# Patient Record
Sex: Female | Born: 1955 | Hispanic: No | Marital: Single | Smoking: Former smoker
Health system: Southern US, Community
[De-identification: ages and names within clinical notes are randomized; demographics above are authoritative.]

## PROBLEM LIST (undated history)

## (undated) DIAGNOSIS — G35D Multiple sclerosis, unspecified: Secondary | ICD-10-CM

## (undated) DIAGNOSIS — R55 Syncope and collapse: Secondary | ICD-10-CM

## (undated) DIAGNOSIS — I10 Essential (primary) hypertension: Secondary | ICD-10-CM

## (undated) DIAGNOSIS — R7303 Prediabetes: Secondary | ICD-10-CM

## (undated) DIAGNOSIS — M1990 Unspecified osteoarthritis, unspecified site: Secondary | ICD-10-CM

## (undated) DIAGNOSIS — Z87442 Personal history of urinary calculi: Secondary | ICD-10-CM

## (undated) DIAGNOSIS — D219 Benign neoplasm of connective and other soft tissue, unspecified: Secondary | ICD-10-CM

## (undated) DIAGNOSIS — M539 Dorsopathy, unspecified: Secondary | ICD-10-CM

## (undated) DIAGNOSIS — K219 Gastro-esophageal reflux disease without esophagitis: Secondary | ICD-10-CM

## (undated) DIAGNOSIS — F419 Anxiety disorder, unspecified: Secondary | ICD-10-CM

## (undated) DIAGNOSIS — N809 Endometriosis, unspecified: Secondary | ICD-10-CM

## (undated) DIAGNOSIS — L732 Hidradenitis suppurativa: Secondary | ICD-10-CM

## (undated) DIAGNOSIS — M069 Rheumatoid arthritis, unspecified: Secondary | ICD-10-CM

## (undated) DIAGNOSIS — I5189 Other ill-defined heart diseases: Secondary | ICD-10-CM

## (undated) DIAGNOSIS — J45909 Unspecified asthma, uncomplicated: Secondary | ICD-10-CM

## (undated) DIAGNOSIS — G709 Myoneural disorder, unspecified: Secondary | ICD-10-CM

## (undated) DIAGNOSIS — R011 Cardiac murmur, unspecified: Secondary | ICD-10-CM

## (undated) DIAGNOSIS — B1920 Unspecified viral hepatitis C without hepatic coma: Secondary | ICD-10-CM

## (undated) DIAGNOSIS — G35 Multiple sclerosis: Secondary | ICD-10-CM

## (undated) DIAGNOSIS — C801 Malignant (primary) neoplasm, unspecified: Secondary | ICD-10-CM

## (undated) DIAGNOSIS — M75100 Unspecified rotator cuff tear or rupture of unspecified shoulder, not specified as traumatic: Secondary | ICD-10-CM

## (undated) DIAGNOSIS — F32A Depression, unspecified: Secondary | ICD-10-CM

## (undated) HISTORY — DX: Prediabetes: R73.03

## (undated) HISTORY — PX: ROTATOR CUFF REPAIR: SHX139

## (undated) HISTORY — DX: Unspecified rotator cuff tear or rupture of unspecified shoulder, not specified as traumatic: M75.100

## (undated) HISTORY — DX: Unspecified osteoarthritis, unspecified site: M19.90

## (undated) HISTORY — DX: Syncope and collapse: R55

## (undated) HISTORY — DX: Unspecified asthma, uncomplicated: J45.909

## (undated) HISTORY — DX: Endometriosis, unspecified: N80.9

## (undated) HISTORY — DX: Other ill-defined heart diseases: I51.89

## (undated) HISTORY — DX: Gastro-esophageal reflux disease without esophagitis: K21.9

## (undated) HISTORY — DX: Depression, unspecified: F32.A

## (undated) HISTORY — DX: Benign neoplasm of connective and other soft tissue, unspecified: D21.9

## (undated) HISTORY — DX: Rheumatoid arthritis, unspecified: M06.9

## (undated) HISTORY — DX: Hidradenitis suppurativa: L73.2

## (undated) HISTORY — DX: Dorsopathy, unspecified: M53.9

## (undated) HISTORY — PX: BACK SURGERY: SHX140

## (undated) HISTORY — PX: ANGIOPLASTY: SHX39

## (undated) HISTORY — PX: OTHER SURGICAL HISTORY: SHX169

---

## 2007-01-15 ENCOUNTER — Other Ambulatory Visit: Admission: RE | Admit: 2007-01-15 | Discharge: 2007-01-15 | Payer: Self-pay | Admitting: Obstetrics and Gynecology

## 2007-01-17 ENCOUNTER — Encounter: Admission: RE | Admit: 2007-01-17 | Discharge: 2007-01-17 | Payer: Self-pay | Admitting: Obstetrics and Gynecology

## 2007-02-11 ENCOUNTER — Encounter: Admission: RE | Admit: 2007-02-11 | Discharge: 2007-02-11 | Payer: Self-pay | Admitting: Obstetrics and Gynecology

## 2007-06-12 ENCOUNTER — Ambulatory Visit (HOSPITAL_COMMUNITY): Admission: RE | Admit: 2007-06-12 | Discharge: 2007-06-12 | Payer: Self-pay | Admitting: Obstetrics and Gynecology

## 2007-06-12 ENCOUNTER — Encounter (INDEPENDENT_AMBULATORY_CARE_PROVIDER_SITE_OTHER): Payer: Self-pay | Admitting: Obstetrics and Gynecology

## 2008-07-22 ENCOUNTER — Other Ambulatory Visit: Admission: RE | Admit: 2008-07-22 | Discharge: 2008-07-22 | Payer: Self-pay | Admitting: Obstetrics and Gynecology

## 2008-07-22 ENCOUNTER — Encounter: Admission: RE | Admit: 2008-07-22 | Discharge: 2008-07-22 | Payer: Self-pay | Admitting: Family Medicine

## 2008-07-28 ENCOUNTER — Encounter: Admission: RE | Admit: 2008-07-28 | Discharge: 2008-07-28 | Payer: Self-pay | Admitting: Family Medicine

## 2008-08-26 ENCOUNTER — Encounter: Payer: Self-pay | Admitting: Internal Medicine

## 2008-09-04 ENCOUNTER — Encounter: Payer: Self-pay | Admitting: Internal Medicine

## 2008-09-09 ENCOUNTER — Inpatient Hospital Stay (HOSPITAL_BASED_OUTPATIENT_CLINIC_OR_DEPARTMENT_OTHER): Admission: RE | Admit: 2008-09-09 | Discharge: 2008-09-09 | Payer: Self-pay | Admitting: Cardiology

## 2008-09-23 ENCOUNTER — Encounter: Payer: Self-pay | Admitting: Internal Medicine

## 2008-10-02 ENCOUNTER — Encounter: Payer: Self-pay | Admitting: Internal Medicine

## 2008-10-13 ENCOUNTER — Ambulatory Visit: Payer: Self-pay | Admitting: Internal Medicine

## 2008-10-13 DIAGNOSIS — I479 Paroxysmal tachycardia, unspecified: Secondary | ICD-10-CM | POA: Insufficient documentation

## 2008-10-13 DIAGNOSIS — N809 Endometriosis, unspecified: Secondary | ICD-10-CM | POA: Insufficient documentation

## 2008-10-13 DIAGNOSIS — M1990 Unspecified osteoarthritis, unspecified site: Secondary | ICD-10-CM | POA: Insufficient documentation

## 2008-10-13 DIAGNOSIS — F411 Generalized anxiety disorder: Secondary | ICD-10-CM | POA: Insufficient documentation

## 2008-10-13 DIAGNOSIS — I472 Ventricular tachycardia: Secondary | ICD-10-CM | POA: Insufficient documentation

## 2008-10-13 DIAGNOSIS — F329 Major depressive disorder, single episode, unspecified: Secondary | ICD-10-CM | POA: Insufficient documentation

## 2008-10-13 DIAGNOSIS — I10 Essential (primary) hypertension: Secondary | ICD-10-CM | POA: Insufficient documentation

## 2008-10-13 DIAGNOSIS — D259 Leiomyoma of uterus, unspecified: Secondary | ICD-10-CM | POA: Insufficient documentation

## 2008-10-13 DIAGNOSIS — J45909 Unspecified asthma, uncomplicated: Secondary | ICD-10-CM | POA: Insufficient documentation

## 2008-10-13 DIAGNOSIS — R000 Tachycardia, unspecified: Secondary | ICD-10-CM | POA: Insufficient documentation

## 2008-10-13 DIAGNOSIS — K219 Gastro-esophageal reflux disease without esophagitis: Secondary | ICD-10-CM | POA: Insufficient documentation

## 2008-10-14 ENCOUNTER — Encounter: Payer: Self-pay | Admitting: Internal Medicine

## 2008-10-23 ENCOUNTER — Ambulatory Visit: Payer: Self-pay | Admitting: Internal Medicine

## 2008-10-23 DIAGNOSIS — R55 Syncope and collapse: Secondary | ICD-10-CM | POA: Insufficient documentation

## 2008-10-23 LAB — CONVERTED CEMR LAB
BUN: 11 mg/dL (ref 6–23)
Basophils Absolute: 0.1 10*3/uL (ref 0.0–0.1)
Basophils Relative: 2.2 % (ref 0.0–3.0)
CO2: 29 meq/L (ref 19–32)
Calcium: 8.9 mg/dL (ref 8.4–10.5)
Chloride: 108 meq/L (ref 96–112)
Creatinine, Ser: 1 mg/dL (ref 0.4–1.2)
Eosinophils Absolute: 0.3 10*3/uL (ref 0.0–0.7)
Eosinophils Relative: 5.4 % — ABNORMAL HIGH (ref 0.0–5.0)
GFR calc non Af Amer: 62.25 mL/min (ref >60)
Glucose, Bld: 136 mg/dL — ABNORMAL HIGH (ref 70–99)
HCT: 39.4 % (ref 36.0–46.0)
Hemoglobin: 13.8 g/dL (ref 12.0–15.0)
INR: 1 (ref 0.8–1.0)
Lymphocytes Relative: 52.5 % — ABNORMAL HIGH (ref 12.0–46.0)
Lymphs Abs: 2.5 10*3/uL (ref 0.7–4.0)
MCHC: 34.9 g/dL (ref 30.0–36.0)
MCV: 94.7 fL (ref 78.0–100.0)
Monocytes Absolute: 0.6 10*3/uL (ref 0.1–1.0)
Monocytes Relative: 12.5 % — ABNORMAL HIGH (ref 3.0–12.0)
Neutro Abs: 1.3 10*3/uL — ABNORMAL LOW (ref 1.4–7.7)
Neutrophils Relative %: 27.4 % — ABNORMAL LOW (ref 43.0–77.0)
Platelets: 192 10*3/uL (ref 150.0–400.0)
Potassium: 3.6 meq/L (ref 3.5–5.1)
Prothrombin Time: 10.5 s — ABNORMAL LOW (ref 10.9–13.3)
RBC: 4.16 M/uL (ref 3.87–5.11)
RDW: 11.8 % (ref 11.5–14.6)
Sodium: 142 meq/L (ref 135–145)
WBC: 4.8 10*3/uL (ref 4.5–10.5)
aPTT: 27.7 s (ref 21.7–28.8)

## 2008-10-27 ENCOUNTER — Ambulatory Visit (HOSPITAL_COMMUNITY): Admission: RE | Admit: 2008-10-27 | Discharge: 2008-10-27 | Payer: Self-pay | Admitting: Internal Medicine

## 2008-10-27 ENCOUNTER — Ambulatory Visit: Payer: Self-pay | Admitting: Internal Medicine

## 2008-10-29 ENCOUNTER — Encounter: Payer: Self-pay | Admitting: Internal Medicine

## 2008-11-28 ENCOUNTER — Emergency Department: Payer: Self-pay | Admitting: Emergency Medicine

## 2008-12-02 ENCOUNTER — Ambulatory Visit: Payer: Self-pay | Admitting: Internal Medicine

## 2009-03-11 ENCOUNTER — Ambulatory Visit: Payer: Self-pay | Admitting: Internal Medicine

## 2009-04-28 ENCOUNTER — Emergency Department: Payer: Self-pay

## 2009-12-17 ENCOUNTER — Other Ambulatory Visit: Admission: RE | Admit: 2009-12-17 | Discharge: 2009-12-17 | Payer: Self-pay | Admitting: Obstetrics and Gynecology

## 2010-05-30 ENCOUNTER — Encounter: Payer: Self-pay | Admitting: Obstetrics and Gynecology

## 2010-06-07 ENCOUNTER — Other Ambulatory Visit: Payer: Self-pay | Admitting: Family Medicine

## 2010-06-07 DIAGNOSIS — Z1231 Encounter for screening mammogram for malignant neoplasm of breast: Secondary | ICD-10-CM

## 2010-06-07 DIAGNOSIS — Z1239 Encounter for other screening for malignant neoplasm of breast: Secondary | ICD-10-CM

## 2010-06-07 NOTE — Medication Information (Signed)
Summary: Prescription Authorization  Prescription Authorization   Imported By: Roderic Ovens 11/19/2008 15:19:26  _____________________________________________________________________  External Attachment:    Type:   Image     Comment:   External Document

## 2010-06-07 NOTE — Letter (Signed)
Summary: [facility] Cardiology referral  [facility] Cardiology referral   Imported By: Kassie Mends 10/23/2008 08:45:15  _____________________________________________________________________  External Attachment:    Type:   Image     Comment:   External Document

## 2010-06-07 NOTE — Assessment & Plan Note (Signed)
Summary: per check out/sf   Visit Type:  Follow-up Referring Finnis Colee:  Eliott Nine, MD Primary Matai Carpenito:  Tiburcio Pea, MD  CC:  follow-up.  History of Present Illness: The patient presents today for routine electrophysiology followup. She reports doing well since last being seen in our clinic.  She reports several brief episodes of palpitations, lasting < 1 minute without presyncope or syncope.  The patient denies symptoms of chest pain, shortness of breath, orthopnea, PND, lower extremity edema, or neurologic sequela.  She is tolerating medications without difficulty.  Current Medications (verified): 1)  Verelan 180 Mg Xr24h-Cap (Verapamil Hcl) .... One By Mouth Once Daily 2)  Advair Diskus 250-50 Mcg/dose Misc (Fluticasone-Salmeterol) .Marland Kitchen.. 1 By Mouth Once Daily 3)  Nexium 40 Mg Cpdr (Esomeprazole Magnesium) .... Take 1 By Mouth At Bedtime 4)  Xanax 1 Mg Tabs (Alprazolam) .... Take 2  By Mouth At Bedtime, Take 1 By Mouth As Needed During The Day 5)  Oxycontin 40 Mg Xr12h-Tab (Oxycodone Hcl) .Marland Kitchen.. 1 By Mouth Three Times A Day As Needed 6)  Vitamin D 1000 Unit  Tabs (Cholecalciferol) .... 600 Once Daily 7)  Caltrate 600 1500 Mg Tabs (Calcium Carbonate) .Marland Kitchen.. 1 By Mouth Once Daily 8)  Remifemin 20 Mg Tabs (Black Cohosh) .Marland Kitchen.. 1 By Mouth Once Daily 9)  Zoloft 100 Mg Tabs (Sertraline Hcl) .... Take 1 1/2 By Mouth At Bedtime 10)  Multivitamins   Tabs (Multiple Vitamin) .Marland Kitchen.. 1 By Mouth Once Daily 11)  Aspirin 81 Mg Tbec (Aspirin) .... Take One Tablet By Mouth Daily 12)  Ventolin Hfa 108 (90 Base) Mcg/act Aers (Albuterol Sulfate) .... Take 2 Puffs As Needed 13)  Etodolac 200 Mg Caps (Etodolac) .... Take 1 By Mouth Two Times A Day  Allergies: 1)  ! Flexeril  Past History:  Past Medical History: Reviewed history from 10/13/2008 and no changes required. FIBROIDS, UTERUS (ICD-218.9) ENDOMETRIOSIS (ICD-617.9) GERD (ICD-530.81) ASTHMA (ICD-493.90) DEPRESSION (ICD-311) ANXIETY DISORDER  (ICD-300.00) HYPERTENSION (ICD-401.9) DEGENERATIVE JOINT DISEASE (ICD-715.90) DDD TACHYCARDIA SYNCOPE  Past Surgical History: Reviewed history from 12/02/2008 and no changes required.  1. Back disc removed in 1996.   2. Surgical  treatment of endometriosis with lysis of adhesions.   3. EP study 6/10  Social History: Reviewed history from 10/13/2008 and no changes required. Lives in [place] with husband.  Grown daughter.   No ethanol or drug use.  She smokes 10 cigarettes per day for 15 years, trying to quit     Vital Signs:  Patient profile:   55 year old female Height:      69 inches Weight:      188 pounds BMI:     27.86 Pulse rate:   72 / minute BP sitting:   128 / 72  (right arm) Cuff size:   regular  Vitals Entered By: Stanton Kidney, EMT-P (March 11, 2009 3:34 PM)  Physical Exam  General:  Well developed, well nourished, in no acute distress. Head:  normocephalic and atraumatic Eyes:  PERRLA/EOM intact; conjunctiva and lids normal. Nose:  no deformity, discharge, inflammation, or lesions Mouth:  Teeth, gums and palate normal. Oral mucosa normal. Neck:  Neck supple, no JVD. No masses, thyromegaly or abnormal cervical nodes. Lungs:  Clear bilaterally to auscultation and percussion. Heart:  Non-displaced PMI, chest non-tender; regular rate and rhythm, S1, S2 without murmurs, rubs or gallops. Carotid upstroke normal, no bruit. Normal abdominal aortic size, no bruits. Femorals normal pulses, no bruits. Pedals normal pulses. No edema, no varicosities. Abdomen:  Bowel sounds positive; abdomen soft and non-tender without masses, organomegaly, or hernias noted. No hepatosplenomegaly. Msk:  Back normal, normal gait. Muscle strength and tone normal. Pulses:  pulses normal in all 4 extremities Extremities:  No clubbing or cyanosis. Neurologic:  Alert and oriented x 3. Skin:  Intact without lesions or rashes. Psych:  Normal affect.   Impression & Recommendations:  Problem  # 1:  SYNCOPE AND COLLAPSE (ICD-780.2) No further syncope.  At my advise, she is no longer driving.  Her updated medication list for this problem includes:    Verelan 180 Mg Xr24h-cap (Verapamil hcl) ..... One by mouth once daily    Aspirin 81 Mg Tbec (Aspirin) .Marland Kitchen... Take one tablet by mouth daily  Problem # 2:  TACHYCARDIA (ICD-785.0) Much improved with medical therapy.  Prior EP study revealed no inducible arrhythmias.  Continue current medical strategy.  Patient Instructions: 1)  Your physician recommends that you schedule a follow-up appointment as needed 2)  Continue to follow closely with Dr Mayford Knife.

## 2010-06-07 NOTE — Assessment & Plan Note (Signed)
Summary: NEP/AFLUTTER VS VTACHY/SYNCOPE/COLLAPSE/JML   Visit Type:  Initial Consult Referring [name]:  Yolanda Nine, MD Primary [name]:  Yolanda Pea, MD  CC:  syncope/a-flutter vs vtach.  History of Present Illness: [name] is a pleasant 55 yo AAF with a history of tachycardia and syncope who presents tdoay for EP consultation regarding her tachycardia.  The patient reports initially developing palpitations as a child.  She reports heart racing, dizziness, and syncope as a child.  She estimates that she has had 10 episodes of syncope during her lifetime, with 3 episodes in the last 2 years.  She reports that episodes may occur at rest or without activities.  Her most recent episode occured several months ago while waiting for her husband at the bank.  She notes that episodes typically begin with heart racing followed by dizziness, diaphoresis, and presyncope. She was recently evaluated by Dr [name], who placed an event monitor.  This documented wide complex tachycardia of abrupt onset and termination, with variable heart rates and durations.  Though she has not been able to identify triggers for these episodes, she notes that episodes will often terminate with cold water.  Episodes of heart racing typically last less than 1 minutes before terminating.  She recently developed chest "pressure" for which she underwent heart catheterization which showed normal coronary arteries and a preserved EF.  She has been initiated on Toprol XL which improved symptoms of tachycardia, but have left her feeling fatigued.  Current Medications (verified): 1)  Metoprolol Succinate 25 Mg Xr24h-Tab (Metoprolol Succinate) .... Take One Tablet By Mouth Daily 2)  Advair Diskus 250-50 Mcg/dose Misc (Fluticasone-Salmeterol) .Marland Kitchen.. 1 By Mouth Once Daily 3)  Nexium 40 Mg Cpdr (Esomeprazole Magnesium) .Marland Kitchen.. 1 By Mouth Two Times A Day 4)  Xanax 1 Mg Tabs (Alprazolam) .Marland Kitchen.. 1 By Mouth At Bedtime 5)  Oxycontin 40 Mg Xr12h-Tab  (Oxycodone Hcl) .Marland Kitchen.. 1 By Mouth Three Times A Day As Needed 6)  Vitamin D 1000 Unit  Tabs (Cholecalciferol) .... 600 Once Daily 7)  Caltrate 600 1500 Mg Tabs (Calcium Carbonate) .Marland Kitchen.. 1 By Mouth Once Daily 8)  Remifemin 20 Mg Tabs (Black Cohosh) .Marland Kitchen.. 1 By Mouth Once Daily 9)  Zoloft 50 Mg Tabs (Sertraline Hcl) .... 3 By Mouth Once Daily 10)  Multivitamins   Tabs (Multiple Vitamin) .Marland Kitchen.. 1 By Mouth Once Daily  Allergies (verified): 1)  ! Flexeril  Past History:  Past Medical History: FIBROIDS, UTERUS (ICD-218.9) ENDOMETRIOSIS (ICD-617.9) GERD (ICD-530.81) ASTHMA (ICD-493.90) DEPRESSION (ICD-311) ANXIETY DISORDER (ICD-300.00) HYPERTENSION (ICD-401.9) DEGENERATIVE JOINT DISEASE (ICD-715.90) DDD TACHYCARDIA SYNCOPE  Past Surgical History:  1. Back disc removed in 1996.   2. Surgical  treatment of endometriosis with lysis of adhesions.   Family History: 1. Hypertension.   2. Diabetes.   3. Stroke.   4. Heart attack.   5. Breast cancer.   6. Ovarian cancer, not specified.   Brother died of MI. Denies sudden death or arrhythmias.    Social History: Lives in [place] with husband.  Grown daughter.   No ethanol or drug use.  She smokes 10 cigarettes per day for 15 years, trying to quit     Review of Systems       All systems are reviewed and negative except as listed in the HPI.   Vital Signs:  Patient profile:   55 year old female Height:      69 inches Weight:      186.25 pounds BMI:     27.60 Pulse rate:  68 / minute Pulse rhythm:   regular BP sitting:   100 / 60  (left arm) Cuff size:   regular  Vitals Entered By: [name] (October 13, 2008 2:56 PM)  Physical Exam  General:  Well developed, well nourished, in no acute distress. Head:  normocephalic and atraumatic Eyes:  PERRLA/EOM intact; conjunctiva and lids normal. Nose:  no deformity, discharge, inflammation, or lesions Mouth:  Teeth, gums and palate normal. Oral mucosa normal. Neck:  Neck supple, no  JVD. No masses, thyromegaly or abnormal cervical nodes. Lungs:  Clear bilaterally to auscultation and percussion. Heart:  Non-displaced PMI, chest non-tender; regular rate and rhythm, S1, S2 without murmurs, rubs or gallops. Carotid upstroke normal, no bruit. Normal abdominal aortic size, no bruits. Femorals normal pulses, no bruits. Pedals normal pulses. No edema, no varicosities. Abdomen:  Bowel sounds positive; abdomen soft and non-tender without masses, organomegaly, or hernias noted. No hepatosplenomegaly. Msk:  Back normal, normal gait. Muscle strength and tone normal. Pulses:  pulses normal in all 4 extremities Extremities:  No clubbing or cyanosis. Neurologic:  Alert and oriented x 3. Skin:  Intact without lesions or rashes. Cervical Nodes:  no significant adenopathy Psych:  Normal affect.   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  I hav reviewed her recent event monitor which reveals abrupt onset/  termination of a wide complex tachycardia, like VT vs abbarant SVT.  She has had these symptoms for years with occasional syncope.  Though her symptoms have improved with toprol, she is intolerant to this medicine.  Therapeutic strategies for tachycardia including medicine and ablation were discussed in detail with the patient today. Risk, benefits, and alternatives to EP study and radiofrequency ablation for tachycardia were also discussed in detail today. These risks include but are not limited to stroke, bleeding, vascular damage, tamponade, perforation, damage to the normal conduction requiring pacemaker, worsening renal function, and death. The patient understands these risk and wishes to proceed.  We will therefore schedule EP study with carto mapping at the next available time.  In the interim I have advised the patient not to drive.  She will also have a  TTE obtained from Dr [name] to rule out structural heart disease.  Her updated medication list for this problem includes:    Metoprolol Succinate 25 Mg Xr24h-tab (Metoprolol succinate) .Marland Kitchen... Take one tablet by mouth daily  Other Orders: Ablation (Ablation)  Patient Instructions: 1)  Smoking cessation is advised.  No driving is advised due to syncope. 2)  Your physician has recommended that you have an ablation.  Catheter ablation is a medical procedure used to treat some cardiac arrhythmias (irregular heartbeats). During catheter ablation, a long, thin, flexible tube is put into a blood vessel in your groin (upper thigh), or neck. This tube is called an ablation catheter. It is then guided to your heart through the blood vessel. Radiofrequency waves destroy small areas of heart tissue where abnormal heartbeats may cause an arrhythmia to start.  Please see the instruction sheet given to you today.

## 2010-06-07 NOTE — Letter (Signed)
Summary: Pre-Operative Instructions and Orders  Pre-Operative Instructions and Orders   Imported By: Kassie Mends 10/23/2008 08:50:44  _____________________________________________________________________  External Attachment:    Type:   Image     Comment:   External Document

## 2010-06-07 NOTE — Letter (Signed)
[facility]  [facility] progress note   Imported By: Kassie Mends 10/22/2008 07:13:06  _____________________________________________________________________  External Attachment:    Type:   Image     Comment:   External Document

## 2010-06-07 NOTE — Assessment & Plan Note (Signed)
Summary: eph/post ablationa   Visit Type:  Follow-up Referring Provider:  Eliott Nine, MD Primary Provider:  Tiburcio Pea, MD  CC:  palpitations.  History of Present Illness: The patient presents today for routine electrophysiology followup. She reports doing well since her EP study.  She reports several brief episodes of palpitations, lasting < 1 minute without presyncope or syncope.  The patient denies symptoms of chest pain, shortness of breath, orthopnea, PND, lower extremity edema, or neurologic sequela. The patient feels fatigued with metoprolol.  Current Medications (verified): 1)  Metoprolol Succinate 25 Mg Xr24h-Tab (Metoprolol Succinate) .... Take One Tablet By Mouth Daily 2)  Advair Diskus 250-50 Mcg/dose Misc (Fluticasone-Salmeterol) .Marland Kitchen.. 1 By Mouth Once Daily 3)  Nexium 40 Mg Cpdr (Esomeprazole Magnesium) .Marland Kitchen.. 1 By Mouth Two Times A Day 4)  Xanax 1 Mg Tabs (Alprazolam) .Marland Kitchen.. 1 By Mouth At Bedtime 5)  Oxycontin 40 Mg Xr12h-Tab (Oxycodone Hcl) .Marland Kitchen.. 1 By Mouth Three Times A Day As Needed 6)  Vitamin D 1000 Unit  Tabs (Cholecalciferol) .... 600 Once Daily 7)  Caltrate 600 1500 Mg Tabs (Calcium Carbonate) .Marland Kitchen.. 1 By Mouth Once Daily 8)  Remifemin 20 Mg Tabs (Black Cohosh) .Marland Kitchen.. 1 By Mouth Once Daily 9)  Zoloft 50 Mg Tabs (Sertraline Hcl) .... 3 By Mouth Once Daily 10)  Multivitamins   Tabs (Multiple Vitamin) .Marland Kitchen.. 1 By Mouth Once Daily  Allergies: 1)  ! Flexeril  Past History:  Past Surgical History:  1. Back disc removed in 1996.   2. Surgical  treatment of endometriosis with lysis of adhesions.   3. EP study 6/10  Social History: Reviewed history from 10/13/2008 and no changes required. Lives in [place] with husband.  Grown daughter.   No ethanol or drug use.  She smokes 10 cigarettes per day for 15 years, trying to quit     Review of Systems       All systems are reviewed and negative except as listed in the HPI.   Vital Signs:  Patient profile:   55 year old  female Height:      69 inches Weight:      184 pounds BMI:     27.27 Pulse rate:   70 / minute Resp:     12 per minute BP sitting:   118 / 70  (left arm)  Vitals Entered By: Kem Parkinson (December 02, 2008 3:14 PM)  Physical Exam  General:  Well developed, well nourished, in no acute distress. Head:  normocephalic and atraumatic Eyes:  PERRLA/EOM intact; conjunctiva and lids normal. Nose:  no deformity, discharge, inflammation, or lesions Mouth:  Teeth, gums and palate normal. Oral mucosa normal. Neck:  Neck supple, no JVD. No masses, thyromegaly or abnormal cervical nodes. Lungs:  Clear bilaterally to auscultation and percussion. Heart:  Non-displaced PMI, chest non-tender; regular rate and rhythm, S1, S2 without murmurs, rubs or gallops. Carotid upstroke normal, no bruit. Normal abdominal aortic size, no bruits. Femorals normal pulses, no bruits. Pedals normal pulses. No edema, no varicosities. Abdomen:  Bowel sounds positive; abdomen soft and non-tender without masses, organomegaly, or hernias noted. No hepatosplenomegaly. Msk:  Back normal, normal gait. Muscle strength and tone normal. Pulses:  pulses normal in all 4 extremities Extremities:  No clubbing or cyanosis. Neurologic:  Alert and oriented x 3. Skin:  Intact without lesions or rashes. Cervical Nodes:  no significant adenopathy Psych:  Normal affect.   Impression & Recommendations:  Problem # 1:  TACHYCARDIA (ICD-785.0) The patient has palpitations  and symptoms of tachycardia.  During her recent EP study, I could not induce arrhythmias.  She had no inducible supraventricular tachycardia, ventricular tachycardia,  or ventricular fibrillation, both on and off isoproterenol.  She had no evidence of accessory pathways. She did have evidence of retrograde dual atrioventricular nodal physiology without inducible atrioventricular nodal reentrant tachycardia.  She continues to have rare, short episodes of palpitations.  She  reports fatigue with metoprolol.  She has had no further syncope.  At this point, I will stop metoprolol and start verapamil 180mg  daily.    Problem # 2:  SYNCOPE AND COLLAPSE (ICD-780.2) No further episodes of syncope.  If she has recurrence of unexplained syncope, we will consider ILR implantation.  Her updated medication list for this problem includes:    Verelan 180 Mg Xr24h-cap (Verapamil hcl) ..... One by mouth once daily  Patient Instructions: 1)  Your physician recommends that you schedule a follow-up appointment in: 3 months with Dr Johney Frame and continue to follow with Dr Mayford Knife as well 2)  Your physician has recommended you make the following change in your medication: stop Metoprolol and start Verapamil 180mg  once daily Prescriptions: VERELAN 180 MG XR24H-CAP (VERAPAMIL HCL) one by mouth once daily  #30 x 11   Entered by:   Dennis Bast, RN, BSN   Authorized by:   Hillis Range, MD   Signed by:   Dennis Bast, RN, BSN on 12/02/2008   Method used:   Electronically to        [facility]. [phone]* (retail)       [address]       [place], [place]  [address]       Ph: [phone]       Fax: [phone]   RxID:   [id number]

## 2010-06-17 ENCOUNTER — Ambulatory Visit
Admission: RE | Admit: 2010-06-17 | Discharge: 2010-06-17 | Disposition: A | Discharge status: Home / Self Care | Payer: Medicaid Other | Source: Ambulatory Visit | Attending: Family Medicine | Admitting: Family Medicine

## 2010-06-17 DIAGNOSIS — Z1231 Encounter for screening mammogram for malignant neoplasm of breast: Secondary | ICD-10-CM

## 2010-07-07 HISTORY — PX: COLONOSCOPY: SHX174

## 2010-07-27 ENCOUNTER — Other Ambulatory Visit: Payer: Self-pay | Admitting: Gastroenterology

## 2010-09-20 NOTE — Discharge Summary (Signed)
NAME:  [name], [name] NO.:  [id number]   MEDICAL RECORD NO.:  [id number]          PATIENT TYPE:  AMB   LOCATION:  CATH                         FACILITY:  [facility]   PHYSICIAN:  Hillis Range, MD       DATE OF BIRTH:  1956/04/19   DATE OF ADMISSION:  10/27/2008  DATE OF DISCHARGE:  10/27/2008                               DISCHARGE SUMMARY   Time of this dictation is greater than 35 minutes.   FINAL DIAGNOSES:  1. Discharging the day of electrophysiology study.  No inducible      supraventricular tachycardia or ventricular tachycardia.      a.     No accessory pathway is identified.      b.     Not able to induce on or off Isuprel.      c.     Long/short coupling, unable to induce ventricular       tachycardia/ventricular fibrillation.  2. History of syncope, events occurring since childhood.  3. Symptom constellation in tachyarrhythmia.      a.     Heart racing/dizziness/diaphoresis/presyncope.  4. Event monitor shows wide complex tachycardia lasting usually less      than 1 minute.  5. Metoprolol improves her tachyarrhythmia symptoms, but causes      fatigue.   SECONDARY DIAGNOSES:  1. Recent catheterization Sep 09, 2008 for syncope and chest pain.      a.     Angiographically normal coronary arteries.      b.     Ejection fraction 60%.  2. History of uterine fibroids.  3. Endometriosis.  4. Gastroesophageal reflux disease.  5. Asthma.  6. Depression.  7. Anxiety.  8. Hypertension.  9. Degenerative joint disease.  10.Status post disk surgery in 1996.  11.Ongoing tobacco habituation.   PROCEDURE:  On October 27, 2008, electrophysiology study.  Unable to induce  supraventricular tachycardia or ventricular tachycardia.  No accessory  pathway is identified.  The patient could not be induced on Isuprel, a  long/short coupling was unable to induce tachyarrhythmia, Dr. Johney Frame.   The patient will discontinue metoprolol, which causes a fatigue and will  start on  verapamil 180 mg daily.   BRIEF HISTORY:  [name] is a 55 year old female.  She has a history of  tachyarrhythmia and syncope, which started in childhood.  She estimates  she has had 10 episodes of syncope and all during her lifetime.  Three  episodes have occurred last 2 years.   These episodes may occur at rest or with activities.  Her last episode  was several months ago.  The episodes typically begin with heart racing,  then dizziness, then diaphoresis, then presyncope.   She was recently seen by Dr. Mayford Knife.  She arranged an event monitor for  this patient.  The monitor showed wide complex tachycardia with abrupt  onset, abrupt termination.  She notes that the episodes will often  terminate with cold water.  [name] recently developed chest pressure.  She underwent left heart catheterization on Sep 09, 2008.  This showed  normal coronary arteries  and a preserved ejection fraction.  The patient  had been taking Toprol-XL, this improve symptoms of tachycardia, but  have left the patient feeling fatigued.   In light of medical therapy which has been suboptimal and continued  tachyarrhythmias as mentioned event monitor, the patient has been  referred to Dr. Johney Frame.  He mentioned the risks and benefits of ablation  therapy, coupled with computer modeling and mapping of the heart.  Understand the risks and benefits, the patient wishes to proceed.   HOSPITAL COURSE:  The patient presents electively on October 27, 2008.  The  electrophysiology study was unable to induce any tachyarrhythmia.  The  patient will discharge after a 6-hour rest.  She goes home with a  prescription for verapamil 180 mg daily.  She continues her other  medications which include;  1. Nexium 40 mg daily.  2. Advair 250/50 one puff daily.  3. Xanax 1 mg daily at bedtime.  4. OxyContin 40 mg taking three times daily as needed.  5. Zoloft 50 mg tablets, one tablet in the morning and two tablets in      the  evening.  6. Vitamin D 1000 units a day.  7. Caltrate 600 daily.  8. Remifemin 20 mg daily.  9. Multivitamin daily.   She follows up at [facility], [address] to see  Dr. Johney Frame, Friday on November 20, 2008 at 3:15.   Lab studies pertinent to this admission were drawn on October 23, 2008.  Sodium 142, potassium 3.6, chloride 108, carbonate 29, glucose 136.  BUN  is 11, creatinine is 1.  White cells are 4.8, hemoglobin 13.8,  hematocrit 39.4, platelets are 192.  Protime is 10.5, INR is 1.0.   The patient was given a prescription for verapamil prior to discharge.  She was asked to stop taking metoprolol succinate.      Maple Mirza, [place]      Hillis Range, MD  Electronically Signed    GM/MEDQ  D:  10/27/2008  T:  10/28/2008  Job:  [id number]   cc:   Melida Quitter, M.D.  Armanda Magic, M.D.

## 2010-09-20 NOTE — Op Note (Signed)
[name], [name]                ACCOUNT NO.:  [id number]   MEDICAL RECORD NO.:  [id number]          PATIENT TYPE:  AMB   LOCATION:  SDC                           FACILITY:  [facility]   PHYSICIAN:  Charles A. Delcambre, MDDATE OF BIRTH:  [birth year]   DATE OF PROCEDURE:  06/12/2007  DATE OF DISCHARGE:                               OPERATIVE REPORT   PREOPERATIVE DIAGNOSES:  Postop postmenopausal bleeding.   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   PROCEDURE:  1. Hysteroscopy.  2. Dilation curettage.  3. Polypectomy.  4. Paracervical block.   SURGEON:  Charles A. Delcambre, MD.   ASSISTANT:  None.   COMPLICATIONS:  None.   ESTIMATED BLOOD LOSS:  Less than 10 mL.   FINDINGS:  A large polyp noted in the right horn or space of the uterus  was noted and resected and measurements of the horns or sides were 7 cm  on the patient's right and 4 cm on the patient's left.   ANESTHESIA:  General by the laryngeal route.  Sorbitol loss 70 mL.  Instrument, sponge and needle count correct x2.   DESCRIPTION OF PROCEDURE:  The patient was taken to the operating room  and placed in supine position.  General anesthetic was induced without  difficulty.  She was then placed in dorsal lithotomy position.  Sterile  prep and drape was undertaken.  A single tooth tenaculum was placed on  the anterior lip of the cervix.  Paracervical block of 20 mL quarter  percent plain Marcaine was placed divided equally at 4 and 8 o'clock.  There was no evidence of intravascular location of injection.  Sounds,  findings were as noted above with initial sound 7 cm going into the,  evidently going into the right cavity.  Hysteroscope was then placed as  the cervix was open enough without dilation to place a scope and  findings were noted above.  Polyp was resected, generalized curettings  were carefully done.  There was no  evidence of perforation.  Hemostasis was excellent after  generalized curetting with very minimal tissue obtained on the  curettings, approximately 1 cm polyp resected.  Tenaculum was removed.  The patient was stable and was awakened and taken to recovery with  physician in attendance.      Charles A. Sydnee Cabal, MD  Electronically Signed     CAD/MEDQ  D:  06/12/2007  T:  06/12/2007  Job:  [id number]

## 2010-09-20 NOTE — Op Note (Signed)
NAME:  [name], [name] NO.:  [id number]   MEDICAL RECORD NO.:  [id number]          PATIENT TYPE:  AMB   LOCATION:  CATH                         FACILITY:  [facility]   PHYSICIAN:  Hillis Range, MD       DATE OF BIRTH:  1956/03/16   DATE OF PROCEDURE:  10/27/2008  DATE OF DISCHARGE:  10/27/2008                               OPERATIVE REPORT   SURGEON:  Hillis Range, MD   PREPROCEDURE DIAGNOSES:  1. Unexplained syncope.  2. Wide complex tachycardia.   POSTPROCEDURE DIAGNOSES:  1. Unexplained syncope.  2. Wide complex tachycardia.   PROCEDURES:  1. Comprehensive electrophysiology study.  2. Coronary sinus pacing and recording.  3. Arrhythmia induction with pacing.  4. Isoproterenol infusion.   INTRODUCTION:  [name] is a pleasant 55 year old female with a history  of longstanding palpitations, dizziness, and syncope.  She recently was  evaluated by Dr. Mayford Knife and had an event monitor placed, which  documented a wide complex tachycardia, which is presumed to be  ventricular tachycardia or aberrantly conducted supraventricular  tachycardia.  She has a normal left ventricular ejection fraction and  normal coronary arteries.  A recent transthoracic echocardiogram  revealed no evidence of structural abnormalities.  She now presents for  EP study and radiofrequency ablation, as she is continued to have  symptoms of palpitations and heart racing despite medical therapy with  metoprolol.   DESCRIPTION OF PROCEDURE:  Informed written consent was obtained and the  patient was brought to the Electrophysiology Lab in the fasting state.  She was adequately sedated with intravenous Versed and fentanyl as  outlined in the nursing report.  The patient's right neck and groin were  prepped and draped in the usual sterile fashion by the EP lab staff.  Using a percutaneous Seldinger technique, one 6-French hemostasis sheath  was placed in the right internal jugular vein.  A  6-French curved  hexapolar Damato catheter was introduced through the right internal  jugular vein and advanced into the coronary sinus for recording and  pacing from this location.  Two 6-French and one 8-French hemostasis  sheaths were placed in the right common femoral vein.  Two 6-French  quadripolar Josephson catheters were then introduced through the right  common femoral vein into the His bundle and right ventricular apex  positions respectively.  The patient presented to the Electrophysiology  Lab in normal sinus rhythm.  Her RR interval measured 914 milliseconds,  PR interval 163 milliseconds, QT interval 401 milliseconds, and QRS  duration 91 milliseconds.  Her AH interval measured 107 milliseconds  with an HV interval of 40 milliseconds.  Ventricular pacing was  performed, which revealed midline decremental VA conduction with a VA  Wenckebach cycle length of 490 milliseconds with no tachycardias  induced.  Ventricular extrastimulus testing was performed, which  revealed midline decremental VA conduction with a retrograde AV nodal  ERP of 600/440 milliseconds.  Rapid atrial pacing was then performed,  which revealed no evidence of PR greater than RR with an AV Wenckebach  cycle length of 430 milliseconds with no tachycardias  induced.  Atrial  extrastimulus testing was performed, which revealed decremental AV  conduction with no clear AH jumps or echo beats and no tachycardias  induced.  The AV nodal ERP was 500/410 milliseconds.  Programmed  extrastimulus testing was then performed from the right ventricular apex  and base positions at basic cycle length of 600 and 550 milliseconds  respectively down to S1, S2, S3, S4 refractoriness.  No ventricular  arrhythmias were induced.  Programmed extrastimulus testing was then  performed from the right ventricular apex position with long short  coupling intervals down to 550/600/230 milliseconds with no tachycardias  induced.  Rapid  ventricular pacing was then performed down to a cycle  length of 250 milliseconds with no tachycardias induced.  Atrial pacing  was then performed down to a cycle length of 250 milliseconds with no  tachycardias induced.  Isoproterenol was, therefore, infused up to 2 mcg  per minute with an adequate increase in heart rate.  Rapid atrial pacing  was then performed, which revealed no evidence of PR greater than RR and  no tachycardias induced.  The AV Wenckebach cycle length was 320  milliseconds.  Atrial extrastimulus testing was performed, which  revealed decremental AV conduction with no AH jump or echo beats  observed.  The atrial ERP was 500/210 milliseconds.  Rapid atrial pacing  was again performed down to a cycle length of 270 milliseconds with no  evidence of PR greater than RR and no tachycardias induced.  Ventricular  pacing was again performed from the right ventricular apex position,  which revealed a VA Wenckebach cycle length of 250 milliseconds with no  tachycardias induced.  Ventricular extrastimulus testing was performed  from the right ventricular apex position at a basic cycle length of 500  milliseconds which did reveal 2 separate retrograde AH jumps with no  echo beats or tachycardias induced.  The ventricular ERP was 500/200  milliseconds.  Isoproterenol was, therefore, allowed to washout.  Rapid  atrial pacing was again performed during isoproterenol washout, and  there was no evidence of PR greater than RR and no tachycardias could be  induced.  Rapid atrial pacing was performed down to a cycle length of  250 milliseconds.  Atrial extrastimulus testing was again performed,  which revealed no AH jump or echo beats and no tachycardia induced with  an AV nodal ERP of 500/350 milliseconds.  The procedure was, therefore,  considered completed.  All catheters were removed, and the sheaths were  aspirated and flushed.  The sheaths were removed, and hemostasis was  assured.   There were no early apparent complications.   CONCLUSIONS:  1. Sinus rhythm upon presentation.  2. No inducible supraventricular tachycardia, ventricular tachycardia,      or ventricular fibrillation, both on and off isoproterenol.  3. No evidence of accessory pathways.  4. The patient had evidence of retrograde dual atrioventricular nodal      physiology without inducible atrioventricular nodal reentrant      tachycardia.  5. No early apparent complications.      Hillis Range, MD  Electronically Signed     JA/MEDQ  D:  10/27/2008  T:  10/28/2008  Job:  [id number]

## 2010-09-20 NOTE — Cardiovascular Report (Signed)
NAME:  [name], [name] NO.:  [id number]   MEDICAL RECORD NO.:  [id number]          PATIENT TYPE:  OIB   LOCATION:  1963                         FACILITY:  [facility]   PHYSICIAN:  Armanda Magic, M.D.     DATE OF BIRTH:  10/14/1955   DATE OF PROCEDURE:  09/09/2008  DATE OF DISCHARGE:  09/09/2008                            CARDIAC CATHETERIZATION   PROCEDURES:  Left heart catheterization, coronary angiography, and left  ventriculography.   OPERATOR:  Armanda Magic, MD   INDICATIONS:  Chest pain.   COMPLICATIONS:  None.   IV ACCESS:  Via right femoral artery 4-French sheath.   This is a 55 year old female,, who has had an episode of syncope as well  as chest pain and now presents for cardiac catheterization.   The patient was brought to cardiac catheterization laboratory in a  fasting nonsedated state.  Informed consent was obtained.  The patient  was connected to continuous heart rate pulse oximetry monitoring and  intermittent blood pressure monitor.  The right groin was prepped and  draped in sterile fashion.  Xylocaine 1% was used for local anesthesia.  Using a modified Seldinger technique, a 4-French sheath was placed in  right femoral artery.  Under fluoroscopic guidance, a 4-French JL4  catheter was placed in left coronary artery.  Multiple cine films were  taken at 30-degree RAO and 40-degree LAO views.  This catheter was then  exchanged out over a guidewire for a 4-French 3D RCA catheter, which  could not successfully engage with the coronary ostium.  The catheter  was exchanged out over a guidewire for a 4-French JR4 catheter, which  successfully engaged the coronary ostium.  Multiple cine films were  taken at 30-degree RAO and 40-degree LAO views.  This catheter was then  exchanged out over a guidewire for a 4-French angled pigtail catheter,  which was placed under fluoroscopic guidance in the left ventricular  cavity.  Left ventriculography was performed  using the 30-degree RAO  view with 30 mL of contrast at 15 mL per second.  The catheter was then  pulled back across the aortic valve with no significant gradient noted.  At the end of procedure, all catheters and sheaths were removed.  Manual  compression was performed until adequate hemostasis was obtained.  The  patient was transferred back to room in stable condition.   RESULTS:  The left main coronary artery is widely patent and bifurcates  into the left anterior descending artery and left circumflex artery.   The left anterior descending artery is widely patent throughout its  course.  The apex giving rise to 2 diagonal branches both of which are  widely patent.   The left circumflex is widely patent throughout its course in the AV  groove.  It gives rise to a small obtuse marginal 1 branch and then  gives rise to a second obtuse marginal branch and terminates distally in  a third obtuse marginal branch.  The right coronary artery is widely  patent and bifurcates into posterior descending artery, posterior  lateral artery, and both of which  are widely patent.   Left ventriculography shows normal LV function, EF 60%, LV pressure  142/16 mmHg, aortic pressure 134/15 mmHg, and LVEDP 25 mmHg.   ASSESSMENT:  1. Normal coronary arteries.  2. Normal left ventricular function.  3. Noncardiac chest pain.  4. Syncope  5. Elevated    left ventricular end diastole pressure consistent with      diastolic dysfunction.   PLAN:  1. Check a 2-D echocardiogram to rule out structural heart disease.  2. We will order a [employer] heart monitor to evaluate for arrhythmia      that could have caused syncope.  She will follow up with me in 4      weeks and follow up with my nurse practitioner in 2 weeks for groin      check.      Armanda Magic, M.D.  Electronically Signed     TT/MEDQ  D:  09/16/2008  T:  09/16/2008  Job:  [id number]

## 2010-09-23 NOTE — H&P (Signed)
NAMEPATRIZIA, [name]                ACCOUNT NO.:  [id number]   MEDICAL RECORD NO.:  [id number]         PATIENT TYPE:  INP   LOCATION:                                FACILITY:  [facility]   PHYSICIAN:  Charles A. Delcambre, MDDATE OF BIRTH:  November 02, 1955   DATE OF ADMISSION:  05/21/2007  DATE OF DISCHARGE:                              HISTORY & PHYSICAL   This patient is being admitted secondary to postmenopausal bleeding,  endometrium 6 mm and benign biopsy.  Looking to further rule malignancy,  she will be admitted to undergo hysteroscopy, D&C.  She accepts risks of  infection, bleeding, uterine perforation.  All questions were answered,  n.p.o. past midnight.   PAST MEDICAL HISTORY:  1. 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  2. Hypertension.  3. Anxiety.  4. Depression.   SURGICAL HISTORY:  1. Back disc removed in 1996.  2. Uterine scar tissue in 1993.  What she meant by this was probably      diagnostic laparoscopy with treatment of endometriosis with lysis      of adhesions.   ALLERGIES:  No known drug allergies.   SOCIAL HISTORY:  1. No tobacco, ethanol or drug use.  2. She is sexually active with 1 partner for several months.  Prior to      that, abstinent.  She does use condoms all the time.  3. She smokes 10 cigarettes per day for 15 years, no alcohol use.   FAMILY HISTORY:  1. Hypertension.  2. Diabetes.  3. Stroke.  4. Heart attack.  5. Breast cancer.  6. Ovarian cancer, not specified.   REVIEW OF SYSTEMS:  No major symptoms.  No fever, chills, rashes,  lesions, headaches, dizziness, seasonal allergies, chest pain, shortness  of breath, wheezing, diarrhea, constipation, bleeding, melena,  hematochezia, urgency, frequency, dysuria, incontinence or hematuria,  galactorrhea or emotional changes.   PHYSICAL EXAM:  Alert and oriented x3.  Blood pressure 100/70,  respirations 22, heart rate 60, afebrile.  CORONARY:  Regular rate and rhythm without murmur, rub  or gallop.  LUNGS:  Clear bilaterally.  ABDOMEN:  Flat without masses.  PELVIC EXAM:  Normal external female genitalia.  Bartholin, urethral,  Skene within normal limits.  Vulva without discharge.  Multiparous  cervix noted.  Pap has recently been done as well and returned negative  on January 15, 2007.  GC and Chlamydia cultures were negative as well  on January 15, 2007.  BIMANUAL EXAMINATION:  No cervical motion tenderness, minimal tenderness  on examination of the uterus.  I could appreciate a slight increased  size of the uterus.  Adnexa nontender.  I could not feel the ovaries  directly.  Pain was not reproduced by the pelvic exam.   ASSESSMENT:  Postmenopausal bleeding.   PLAN:  Hysteroscopy, D&C as noted above.  Preoperative serum pregnancy  test,  CBC.  NPO past midnight.  All questions are answered.  She will  follow up as directed with surgery scheduled for May 21, 2007.      Charles A. Sydnee Cabal, MD  Electronically Signed  CAD/MEDQ  D:  04/23/2007  T:  04/23/2007  Job:  [id number]

## 2010-12-20 ENCOUNTER — Other Ambulatory Visit: Payer: Self-pay | Admitting: Obstetrics and Gynecology

## 2010-12-20 ENCOUNTER — Other Ambulatory Visit (HOSPITAL_COMMUNITY)
Admission: RE | Admit: 2010-12-20 | Discharge: 2010-12-20 | Disposition: A | Discharge status: Home / Self Care | Payer: Medicaid Other | Source: Ambulatory Visit | Attending: Obstetrics and Gynecology | Admitting: Obstetrics and Gynecology

## 2010-12-20 DIAGNOSIS — Z01419 Encounter for gynecological examination (general) (routine) without abnormal findings: Secondary | ICD-10-CM | POA: Insufficient documentation

## 2011-01-27 LAB — CBC
HCT: 42.8
Hemoglobin: 15.2 — ABNORMAL HIGH
MCHC: 35.4
MCV: 93.7
Platelets: 327
RBC: 4.57
RDW: 12.4
WBC: 5.6

## 2011-01-27 LAB — HCG, SERUM, QUALITATIVE: Preg, Serum: NEGATIVE

## 2011-08-03 ENCOUNTER — Other Ambulatory Visit: Payer: Self-pay | Admitting: Family Medicine

## 2011-08-03 DIAGNOSIS — Z1231 Encounter for screening mammogram for malignant neoplasm of breast: Secondary | ICD-10-CM

## 2011-08-11 ENCOUNTER — Ambulatory Visit: Payer: Medicaid Other

## 2011-08-25 ENCOUNTER — Ambulatory Visit: Payer: Medicaid Other

## 2012-10-08 ENCOUNTER — Ambulatory Visit: Payer: Self-pay | Admitting: Family Medicine

## 2013-08-15 ENCOUNTER — Other Ambulatory Visit: Payer: Self-pay | Admitting: Family Medicine

## 2013-08-15 DIAGNOSIS — R109 Unspecified abdominal pain: Secondary | ICD-10-CM

## 2013-08-19 ENCOUNTER — Ambulatory Visit
Admission: RE | Admit: 2013-08-19 | Discharge: 2013-08-19 | Disposition: A | Discharge status: Home / Self Care | Payer: Medicare Other | Source: Ambulatory Visit | Attending: Family Medicine | Admitting: Family Medicine

## 2013-08-19 DIAGNOSIS — R109 Unspecified abdominal pain: Secondary | ICD-10-CM

## 2013-08-21 ENCOUNTER — Other Ambulatory Visit: Payer: Self-pay | Admitting: Obstetrics and Gynecology

## 2013-08-21 ENCOUNTER — Other Ambulatory Visit (HOSPITAL_COMMUNITY)
Admission: RE | Admit: 2013-08-21 | Discharge: 2013-08-21 | Disposition: A | Discharge status: Home / Self Care | Payer: Medicare Other | Source: Ambulatory Visit | Attending: Obstetrics and Gynecology | Admitting: Obstetrics and Gynecology

## 2013-08-21 DIAGNOSIS — Z1151 Encounter for screening for human papillomavirus (HPV): Secondary | ICD-10-CM | POA: Insufficient documentation

## 2013-08-21 DIAGNOSIS — Z01419 Encounter for gynecological examination (general) (routine) without abnormal findings: Secondary | ICD-10-CM | POA: Insufficient documentation

## 2013-08-21 DIAGNOSIS — Z113 Encounter for screening for infections with a predominantly sexual mode of transmission: Secondary | ICD-10-CM | POA: Insufficient documentation

## 2013-08-27 ENCOUNTER — Other Ambulatory Visit: Payer: Self-pay | Admitting: Family Medicine

## 2013-08-27 DIAGNOSIS — I10 Essential (primary) hypertension: Secondary | ICD-10-CM

## 2013-09-03 ENCOUNTER — Ambulatory Visit
Admission: RE | Admit: 2013-09-03 | Discharge: 2013-09-03 | Disposition: A | Discharge status: Home / Self Care | Payer: Medicare Other | Source: Ambulatory Visit | Attending: Family Medicine | Admitting: Family Medicine

## 2013-09-03 ENCOUNTER — Encounter (INDEPENDENT_AMBULATORY_CARE_PROVIDER_SITE_OTHER): Payer: Self-pay

## 2013-09-03 DIAGNOSIS — I10 Essential (primary) hypertension: Secondary | ICD-10-CM

## 2013-09-03 MED ORDER — IOHEXOL 350 MG/ML SOLN
80.0000 mL | Freq: Once | INTRAVENOUS | Status: AC | PRN
  Administered 2013-09-03: 80 mL via INTRAVENOUS

## 2013-09-05 ENCOUNTER — Other Ambulatory Visit: Payer: Medicare Other

## 2013-09-25 ENCOUNTER — Other Ambulatory Visit: Payer: Self-pay

## 2013-09-25 DIAGNOSIS — Z1231 Encounter for screening mammogram for malignant neoplasm of breast: Secondary | ICD-10-CM

## 2014-01-08 ENCOUNTER — Ambulatory Visit: Payer: Medicare Other

## 2014-01-16 ENCOUNTER — Ambulatory Visit
Admission: RE | Admit: 2014-01-16 | Discharge: 2014-01-16 | Disposition: A | Discharge status: Home / Self Care | Payer: Medicare Other | Source: Ambulatory Visit

## 2014-01-16 DIAGNOSIS — Z1231 Encounter for screening mammogram for malignant neoplasm of breast: Secondary | ICD-10-CM

## 2014-04-20 ENCOUNTER — Other Ambulatory Visit (HOSPITAL_COMMUNITY): Payer: Self-pay | Admitting: Nurse Practitioner

## 2014-04-20 DIAGNOSIS — B182 Chronic viral hepatitis C: Secondary | ICD-10-CM

## 2014-05-05 ENCOUNTER — Ambulatory Visit: Payer: Medicare Other | Attending: Family Medicine | Admitting: Physical Therapy

## 2014-05-05 DIAGNOSIS — M25551 Pain in right hip: Secondary | ICD-10-CM | POA: Insufficient documentation

## 2014-05-05 DIAGNOSIS — M25552 Pain in left hip: Secondary | ICD-10-CM | POA: Insufficient documentation

## 2014-05-07 ENCOUNTER — Ambulatory Visit: Payer: Medicare Other | Admitting: Physical Therapy

## 2014-05-07 DIAGNOSIS — M25551 Pain in right hip: Secondary | ICD-10-CM | POA: Diagnosis not present

## 2014-05-12 ENCOUNTER — Ambulatory Visit: Payer: Medicare Other | Attending: Family Medicine | Admitting: Physical Therapy

## 2014-05-12 DIAGNOSIS — M25552 Pain in left hip: Secondary | ICD-10-CM | POA: Insufficient documentation

## 2014-05-12 DIAGNOSIS — M25551 Pain in right hip: Secondary | ICD-10-CM | POA: Insufficient documentation

## 2014-05-14 ENCOUNTER — Ambulatory Visit (HOSPITAL_COMMUNITY)
Admission: RE | Admit: 2014-05-14 | Discharge: 2014-05-14 | Disposition: A | Discharge status: Home / Self Care | Payer: Medicare Other | Source: Ambulatory Visit | Attending: Nurse Practitioner | Admitting: Nurse Practitioner

## 2014-05-14 DIAGNOSIS — B182 Chronic viral hepatitis C: Secondary | ICD-10-CM | POA: Diagnosis not present

## 2014-05-15 ENCOUNTER — Ambulatory Visit: Payer: Medicare Other | Admitting: Physical Therapy

## 2014-05-15 DIAGNOSIS — M25552 Pain in left hip: Secondary | ICD-10-CM | POA: Diagnosis not present

## 2014-05-15 DIAGNOSIS — M25551 Pain in right hip: Secondary | ICD-10-CM | POA: Diagnosis not present

## 2014-05-19 ENCOUNTER — Ambulatory Visit: Payer: Medicare Other | Admitting: Physical Therapy

## 2014-05-19 DIAGNOSIS — M25551 Pain in right hip: Secondary | ICD-10-CM | POA: Diagnosis not present

## 2014-05-21 ENCOUNTER — Ambulatory Visit: Payer: Medicare Other | Admitting: Physical Therapy

## 2014-05-21 DIAGNOSIS — M25551 Pain in right hip: Secondary | ICD-10-CM | POA: Diagnosis not present

## 2014-05-25 ENCOUNTER — Ambulatory Visit: Payer: Medicare Other | Admitting: Physical Therapy

## 2014-05-25 DIAGNOSIS — M25551 Pain in right hip: Secondary | ICD-10-CM | POA: Diagnosis not present

## 2014-05-28 ENCOUNTER — Ambulatory Visit: Payer: Medicare Other | Admitting: Physical Therapy

## 2014-08-12 ENCOUNTER — Emergency Department (HOSPITAL_COMMUNITY): Payer: Medicare Other

## 2014-08-12 ENCOUNTER — Encounter (HOSPITAL_COMMUNITY): Payer: Self-pay | Admitting: *Deleted

## 2014-08-12 ENCOUNTER — Emergency Department (HOSPITAL_COMMUNITY)
Admission: EM | Admit: 2014-08-12 | Discharge: 2014-08-12 | Disposition: A | Discharge status: Home / Self Care | Payer: Medicare Other | Attending: Emergency Medicine | Admitting: Emergency Medicine

## 2014-08-12 DIAGNOSIS — R110 Nausea: Secondary | ICD-10-CM | POA: Insufficient documentation

## 2014-08-12 DIAGNOSIS — H539 Unspecified visual disturbance: Secondary | ICD-10-CM

## 2014-08-12 DIAGNOSIS — H538 Other visual disturbances: Secondary | ICD-10-CM | POA: Insufficient documentation

## 2014-08-12 DIAGNOSIS — Z8619 Personal history of other infectious and parasitic diseases: Secondary | ICD-10-CM | POA: Diagnosis not present

## 2014-08-12 DIAGNOSIS — I10 Essential (primary) hypertension: Secondary | ICD-10-CM | POA: Insufficient documentation

## 2014-08-12 DIAGNOSIS — R519 Headache, unspecified: Secondary | ICD-10-CM

## 2014-08-12 DIAGNOSIS — Z8739 Personal history of other diseases of the musculoskeletal system and connective tissue: Secondary | ICD-10-CM | POA: Diagnosis not present

## 2014-08-12 DIAGNOSIS — R51 Headache: Secondary | ICD-10-CM | POA: Insufficient documentation

## 2014-08-12 DIAGNOSIS — Z8659 Personal history of other mental and behavioral disorders: Secondary | ICD-10-CM | POA: Insufficient documentation

## 2014-08-12 DIAGNOSIS — R42 Dizziness and giddiness: Secondary | ICD-10-CM | POA: Diagnosis present

## 2014-08-12 DIAGNOSIS — Z87891 Personal history of nicotine dependence: Secondary | ICD-10-CM | POA: Diagnosis not present

## 2014-08-12 HISTORY — DX: Unspecified viral hepatitis C without hepatic coma: B19.20

## 2014-08-12 HISTORY — DX: Anxiety disorder, unspecified: F41.9

## 2014-08-12 HISTORY — DX: Essential (primary) hypertension: I10

## 2014-08-12 LAB — COMPREHENSIVE METABOLIC PANEL
ALT: 22 U/L (ref 0–35)
AST: 20 U/L (ref 0–37)
Albumin: 4 g/dL (ref 3.5–5.2)
Alkaline Phosphatase: 86 U/L (ref 39–117)
Anion gap: 10 (ref 5–15)
BUN: 10 mg/dL (ref 6–23)
CO2: 24 mmol/L (ref 19–32)
Calcium: 9.5 mg/dL (ref 8.4–10.5)
Chloride: 106 mmol/L (ref 96–112)
Creatinine, Ser: 0.9 mg/dL (ref 0.50–1.10)
GFR calc Af Amer: 81 mL/min — ABNORMAL LOW (ref >90)
GFR calc non Af Amer: 70 mL/min — ABNORMAL LOW (ref >90)
Glucose, Bld: 118 mg/dL — ABNORMAL HIGH (ref 70–99)
Potassium: 3.6 mmol/L (ref 3.5–5.1)
Sodium: 140 mmol/L (ref 135–145)
Total Bilirubin: 0.6 mg/dL (ref 0.3–1.2)
Total Protein: 7.4 g/dL (ref 6.0–8.3)

## 2014-08-12 LAB — CBC
HCT: 42.6 % (ref 36.0–46.0)
Hemoglobin: 15 g/dL (ref 12.0–15.0)
MCH: 32.8 pg (ref 26.0–34.0)
MCHC: 35.2 g/dL (ref 30.0–36.0)
MCV: 93.2 fL (ref 78.0–100.0)
Platelets: 281 10*3/uL (ref 150–400)
RBC: 4.57 MIL/uL (ref 3.87–5.11)
RDW: 11.9 % (ref 11.5–15.5)
WBC: 5.2 10*3/uL (ref 4.0–10.5)

## 2014-08-12 LAB — I-STAT TROPONIN, ED: Troponin i, poc: 0 ng/mL (ref 0.00–0.08)

## 2014-08-12 MED ORDER — KETOROLAC TROMETHAMINE 30 MG/ML IJ SOLN
30.0000 mg | INTRAMUSCULAR | Status: AC
  Administered 2014-08-12: 30 mg via INTRAVENOUS
  Filled 2014-08-12: qty 1

## 2014-08-12 MED ORDER — DIPHENHYDRAMINE HCL 50 MG/ML IJ SOLN
25.0000 mg | Freq: Once | INTRAMUSCULAR | Status: AC
  Administered 2014-08-12: 25 mg via INTRAVENOUS
  Filled 2014-08-12: qty 1

## 2014-08-12 MED ORDER — PROCHLORPERAZINE EDISYLATE 5 MG/ML IJ SOLN
10.0000 mg | Freq: Once | INTRAMUSCULAR | Status: AC
  Administered 2014-08-12: 10 mg via INTRAVENOUS
  Filled 2014-08-12: qty 2

## 2014-08-12 MED ORDER — SODIUM CHLORIDE 0.9 % IV BOLUS (SEPSIS)
1000.0000 mL | INTRAVENOUS | Status: AC
  Administered 2014-08-12: 1000 mL via INTRAVENOUS

## 2014-08-12 NOTE — Discharge Instructions (Signed)
Please follow the directions provided. Be sure to follow-up with the neurologist provided for further evaluation of the symptoms. Call to make an appointment discuss with them that you were seen in the emergency department and had a reassuring workup however we need further evaluation for any demyelinating degenerative diseases. Continue to drink clear fluids to stay well hydrated. Take all your other medicines as prescribed. Don't hesitate to return for any new, worsening, or concerning symptoms.    SEEK IMMEDIATE MEDICAL CARE IF:  You develop paralysis.  You have problems with bladder, bowel, or sexual function.  You develop mental changes, such as forgetfulness or mood swings.  You have a period of uncontrolled movements (seizure).

## 2014-08-12 NOTE — ED Provider Notes (Signed)
CSN: [id number]     Arrival date & time 08/12/14  1354 History   First MD Initiated Contact with Patient 08/12/14 1506     Chief Complaint  Patient presents with  . Dizziness  . Nausea   (Consider location/radiation/quality/duration/timing/severity/associated sxs/prior Treatment) HPI   [name] is a 59 yo female presenting with several symptoms over the last 3 weeks.  She reports she has been having ongoing palpitations for years that she has discussed with her PCP.  She states in the last 3 weeks she has been feeling her palpitations but also feeling blurred vision in both eyes with episodes of dizziness and light-headedness appr 3 times a week. In the last 2-3 days these symptoms have seemed to increase in frequency and associated with a brief sharp pain in her left parietal/occipital scalp. The pain lasts only a few seconds but is recurrent every 15-20 min, reportedly worse at night when she is trying to sleep.  She also continues to have episodes of dizziness and light-headedness and when these episodes occur she notices blurry vision in her left periphery.  She reports she did get new glasses in the last week and she also had a meeting with her social worker regarding her housing that initiated her most recent panic attack. This occurred appr 3 weeks ago also just prior to the onset of these symptoms.  She denies any changes in speech or unilateral weakness. She currently denies any chest pain, headache, or visual changes.  Past Medical History  Diagnosis Date  . Hypertension   . Anxiety   . Arthritis   . Hepatitis C    History reviewed. No pertinent past surgical history. History reviewed. No pertinent family history. History  Substance Use Topics  . Smoking status: Former Research scientist (life sciences)  . Smokeless tobacco: Not on file  . Alcohol Use: No   OB History    No data available     Review of Systems  Constitutional: Negative for fever and chills.  HENT: Negative for sore throat.    Eyes: Positive for visual disturbance.  Respiratory: Negative for cough and shortness of breath.   Cardiovascular: Positive for palpitations. Negative for chest pain and leg swelling.  Gastrointestinal: Positive for nausea. Negative for vomiting and diarrhea.  Genitourinary: Negative for dysuria.  Musculoskeletal: Negative for myalgias.  Skin: Negative for rash.  Neurological: Positive for dizziness, light-headedness and headaches. Negative for facial asymmetry, weakness and numbness.      Allergies  Cyclobenzaprine hcl  Home Medications   Prior to Admission medications   Not on File   BP 144/104 mmHg  Pulse 94  Resp 18  SpO2 99% Physical Exam  Constitutional: She is oriented to person, place, and time. She appears well-developed and well-nourished. No distress.  HENT:  Head: Normocephalic and atraumatic.  Mouth/Throat: Oropharynx is clear and moist. No oropharyngeal exudate.  Eyes: Conjunctivae are normal.  Neck: Neck supple. No thyromegaly present.  Cardiovascular: Normal rate, regular rhythm and intact distal pulses.   Pulmonary/Chest: Effort normal and breath sounds normal. No respiratory distress. She has no wheezes. She has no rales. She exhibits no tenderness.  Abdominal: Soft. There is no tenderness.  Musculoskeletal: She exhibits no tenderness.  Lymphadenopathy:    She has no cervical adenopathy.  Neurological: She is alert and oriented to person, place, and time. She has normal strength. No cranial nerve deficit. GCS eye subscore is 4. GCS verbal subscore is 5. GCS motor subscore is 6.  Slight tremor noted during  finger to nose test, all other cranial testing normal  Skin: Skin is warm and dry. No rash noted. She is not diaphoretic.  Psychiatric: She has a normal mood and affect.  Nursing note and vitals reviewed.   ED Course  Procedures (including critical care time) Labs Review Labs Reviewed  COMPREHENSIVE METABOLIC PANEL - Abnormal; Notable for the  following:    Glucose, Bld 118 (*)    GFR calc non Af Amer 70 (*)    GFR calc Af Amer 81 (*)    All other components within normal limits  CBC  I-STAT TROPOININ, ED    Imaging Review Ct Head Wo Contrast  08/12/2014   CLINICAL DATA:  Headache and dizziness. Left facial numbness for 3 days  EXAM: CT HEAD WITHOUT CONTRAST  TECHNIQUE: Contiguous axial images were obtained from the base of the skull through the vertex without intravenous contrast.  COMPARISON:  None.  FINDINGS: Skull and Sinuses:Negative for fracture or destructive process. Skullbase foramina are symmetric. No acute mastoiditis or sinusitis.  Orbits: No acute abnormality.  Brain: No evidence of acute infarction, hemorrhage, hydrocephalus, or mass lesion/mass effect. Mild white matter disease with periventricular and subcortical patchy low-density.  IMPRESSION: 1. No acute intracranial findings. 2. 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 Given young age, demyelinating disease is a differential consideration.   Electronically Signed   By: Monte Fantasia M.D.   On: 08/12/2014 18:01     EKG Interpretation   Date/Time:  Wednesday August 12 2014 14:05:01 EDT Ventricular Rate:  84 PR Interval:  146 QRS Duration: 86 QT Interval:  358 QTC Calculation: 423 R Axis:   63 Text Interpretation:  Normal sinus rhythm Nonspecific ST abnormality  Abnormal ECG Confirmed by Hazle Coca [phone]) on 08/12/2014 3:03:20 PM      MDM   Final diagnoses:  Visual changes  Nonintractable headache, unspecified chronicity pattern, unspecified headache type   59 yo with intermittent dizziness, headache and visual changes.  Her neuro exam is normal except for a mild bilat tremor.  Her orthostatics and visual acuity is unremarkable. Discussed case with Dr. Ralene Bathe. Her labs and head CT were negative for significant abnormality. She felt well after treatment in the ED.  Discussed findings with  pt and importance of follow-up with outpt neurology for the possibility of symptoms being related to demyelinating disease. Pt is well-appearing, in no acute distress and vital signs reviewed and not concerning. She appears safe to be discharged. Return precautions provided.  Pt aware of plan and in agreement.    Filed Vitals:   08/12/14 1515 08/12/14 1530 08/12/14 1600 08/12/14 1900  BP: 158/94 125/74 140/80 129/63  Pulse: 85 84 86 64  TempSrc:      Resp:      SpO2: 99% 97% 98% 100%   Meds given in ED:  Medications  sodium chloride 0.9 % bolus 1,000 mL (0 mLs Intravenous Stopped 08/12/14 1927)  ketorolac (TORADOL) 30 MG/ML injection 30 mg (30 mg Intravenous Given 08/12/14 1821)  prochlorperazine (COMPAZINE) injection 10 mg (10 mg Intravenous Given 08/12/14 1821)  diphenhydrAMINE (BENADRYL) injection 25 mg (25 mg Intravenous Given 08/12/14 1820)    Discharge Medication List as of 08/12/2014  7:09 PM         Britt Bottom, NP 08/14/14 5465  Quintella Reichert, MD 08/17/14 0900

## 2014-08-12 NOTE — ED Notes (Signed)
Pt reports onset last night of dizziness and pain to left posterior head. Reports vision changes for extended amount of time. "just doesn't feel right" ekg done at triage and no acute distress noted.

## 2014-08-24 ENCOUNTER — Other Ambulatory Visit: Payer: Self-pay | Admitting: Obstetrics and Gynecology

## 2014-08-24 ENCOUNTER — Other Ambulatory Visit (HOSPITAL_COMMUNITY)
Admission: RE | Admit: 2014-08-24 | Discharge: 2014-08-24 | Disposition: A | Discharge status: Home / Self Care | Payer: Medicare Other | Source: Ambulatory Visit | Attending: Obstetrics and Gynecology | Admitting: Obstetrics and Gynecology

## 2014-08-24 DIAGNOSIS — Z124 Encounter for screening for malignant neoplasm of cervix: Secondary | ICD-10-CM | POA: Diagnosis present

## 2014-08-25 LAB — CYTOLOGY - PAP

## 2014-12-15 ENCOUNTER — Other Ambulatory Visit: Payer: Self-pay

## 2014-12-15 DIAGNOSIS — Z1231 Encounter for screening mammogram for malignant neoplasm of breast: Secondary | ICD-10-CM

## 2015-01-25 ENCOUNTER — Ambulatory Visit: Payer: Medicare Other

## 2015-02-15 ENCOUNTER — Ambulatory Visit: Payer: Medicare Other

## 2015-02-16 ENCOUNTER — Ambulatory Visit
Admission: RE | Admit: 2015-02-16 | Discharge: 2015-02-16 | Disposition: A | Discharge status: Home / Self Care | Payer: Medicare Other | Source: Ambulatory Visit

## 2015-02-16 DIAGNOSIS — Z1231 Encounter for screening mammogram for malignant neoplasm of breast: Secondary | ICD-10-CM

## 2015-08-07 HISTORY — PX: COLONOSCOPY: SHX174

## 2016-01-17 ENCOUNTER — Other Ambulatory Visit: Payer: Self-pay | Admitting: Family Medicine

## 2016-01-17 DIAGNOSIS — Z1231 Encounter for screening mammogram for malignant neoplasm of breast: Secondary | ICD-10-CM

## 2016-01-17 IMAGING — CT CT ANGIO ABDOMEN
4 of 9 series · 13 of 36 positions shown, 16 images · IV contrast (80CC OMNI 350)
Comparison: US ABDOMEN COMPLETE dated 08/19/2013

CLINICAL DATA: History of hypertension, asymmetric kidney sizes,
evaluate for renal artery stenosis

EXAM:
CT ANGIOGRAPHY ABDOMEN
TECHNIQUE: Multidetector CT imaging of the abdomen was performed using the
standard protocol during bolus administration of intravenous
contrast. Multiplanar reconstructed images including MIPs were
obtained and reviewed to evaluate the vascular anatomy.
CONTRAST:  80mL OMNIPAQUE IOHEXOL 350 MG/ML SOLN

[Series 5: arterial (id) · axial · arterial · 0.78mm/px · z∈[-245,-90]mm · 3 of 124 slices shown]
[im 31/124  soft-tissue]
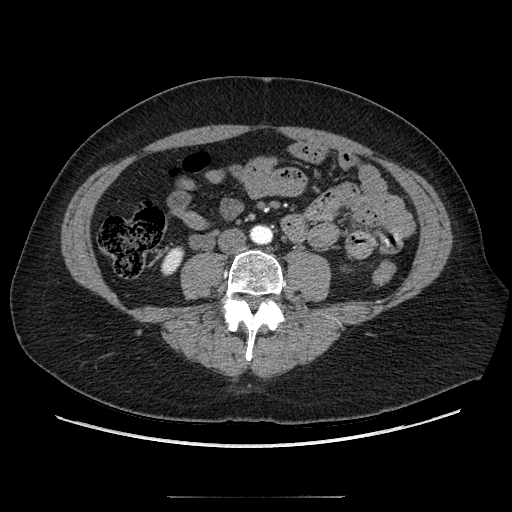
[im 62/124  soft-tissue]
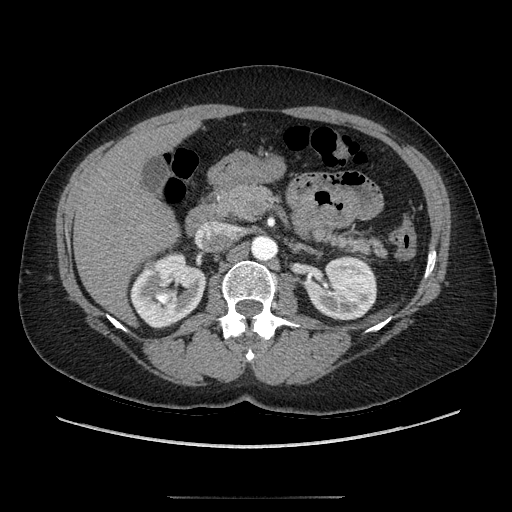
[im 93/124  soft-tissue]
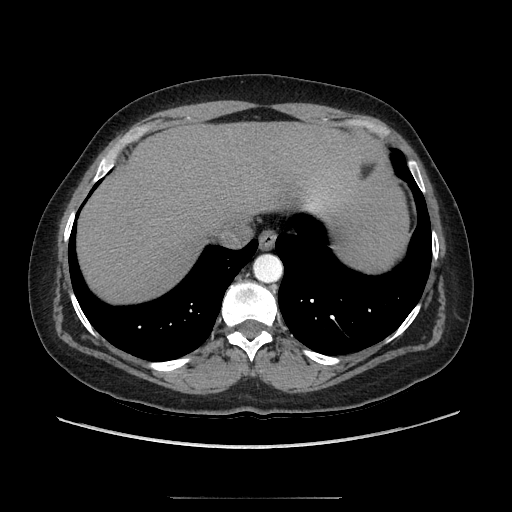

[Series 8: portal venous 5mm · axial · portal-venous · 0.78mm/px · z∈[-317,-12]mm · 2 of 62 slices shown, 5 images]
[im 1/62  soft-tissue]
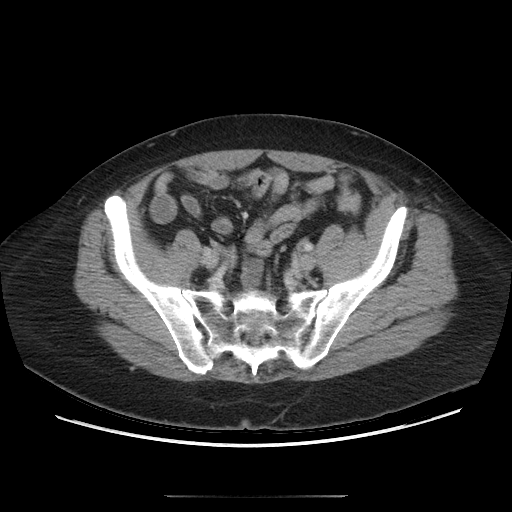
[im 1/62  lung]
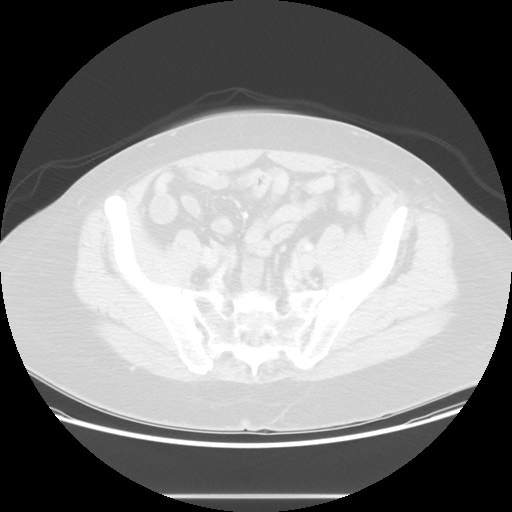
[im 1/62  bone]
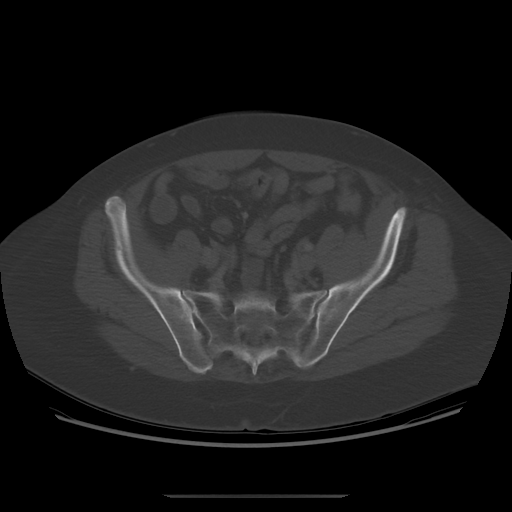
[im 62/62  soft-tissue]
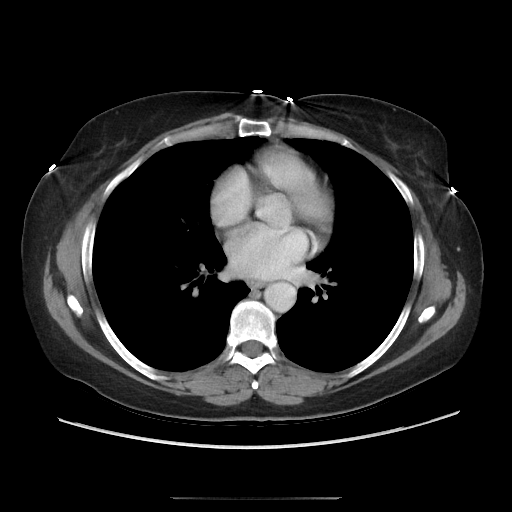
[im 62/62  lung]
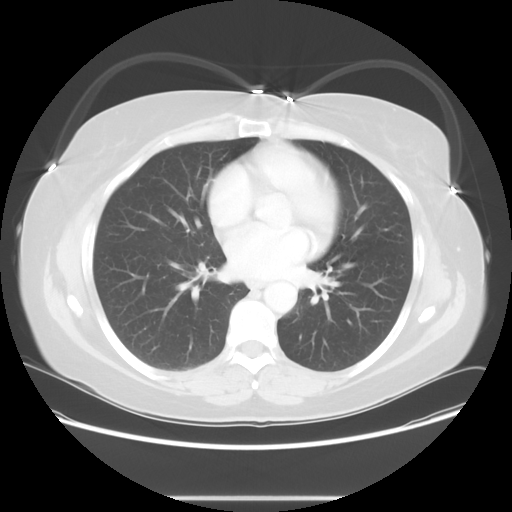

[Series 602: sagittal body · sagittal · 0.78mm/px · 4 of 161 slices shown (1 of 2)]
[im 33/161  soft-tissue]
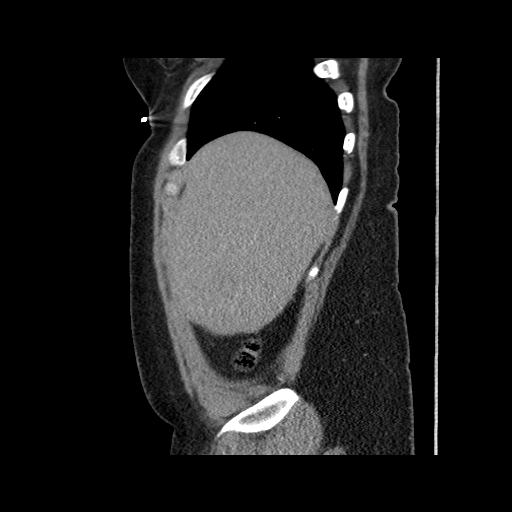
[im 65/161  soft-tissue]
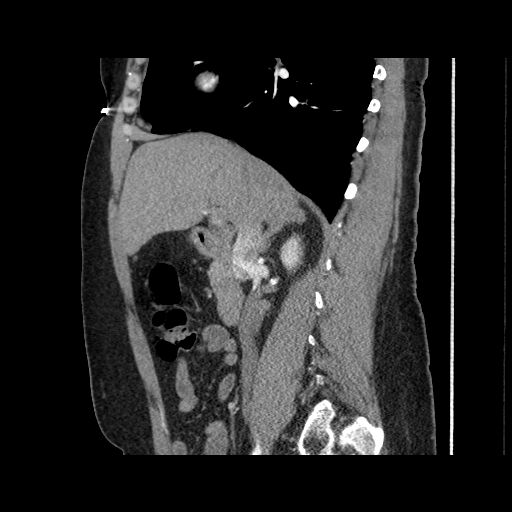
[im 97/161  soft-tissue]
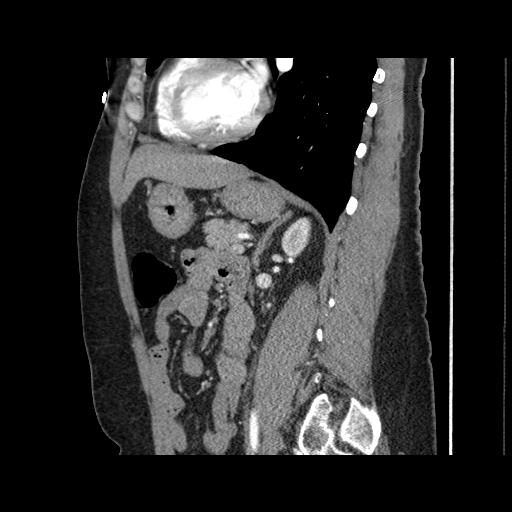
[im 129/161  soft-tissue]
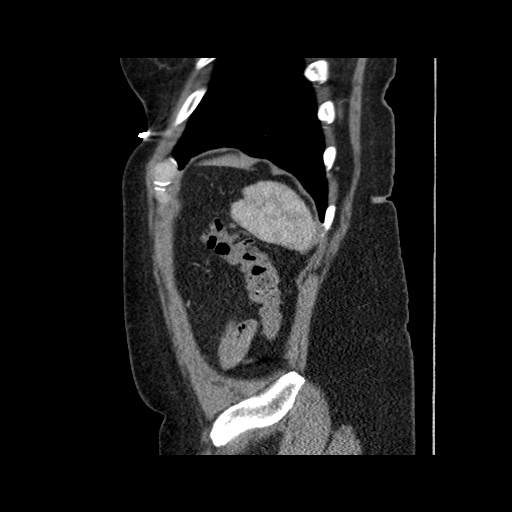

[Series 607: sagittal body · sagittal · 0.78mm/px · 4 of 161 slices shown (2 of 2)]
[im 33/161  soft-tissue]
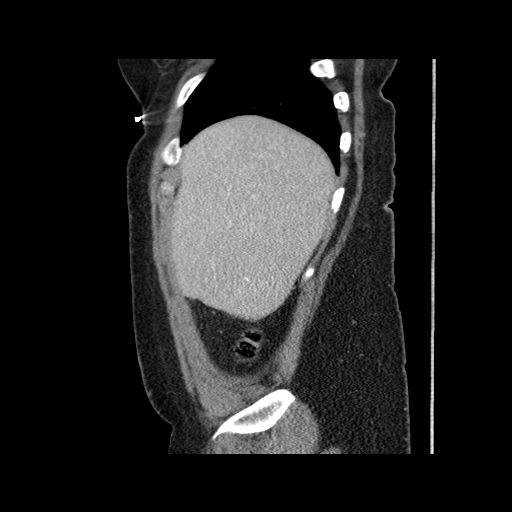
[im 65/161  soft-tissue]
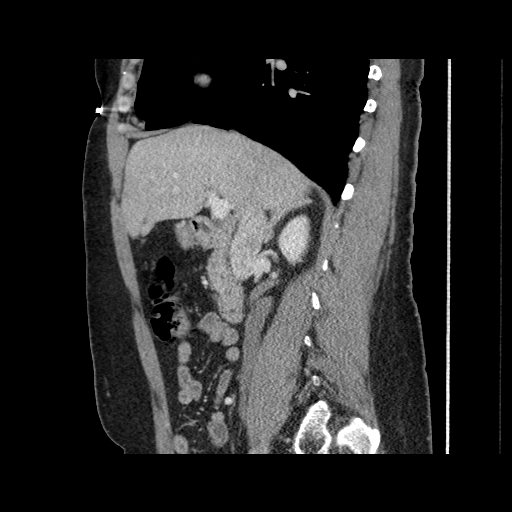
[im 97/161  soft-tissue]
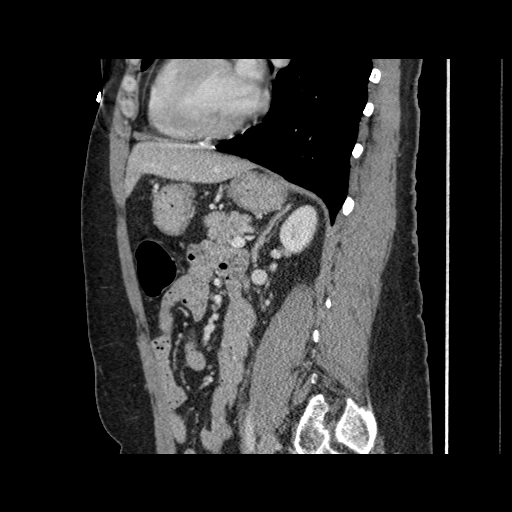
[im 129/161  soft-tissue]
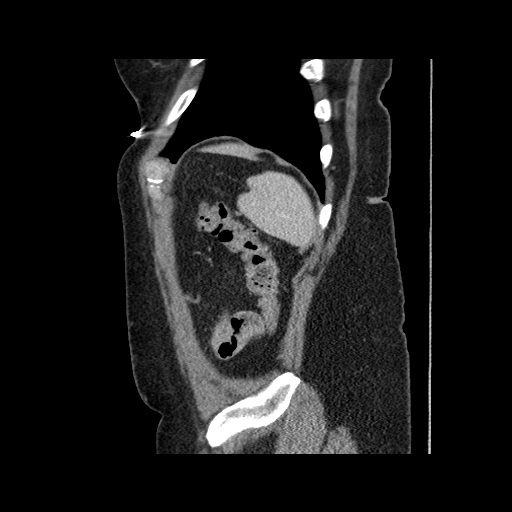

[13 of 36 positions shown; findings below may reference images not displayed]

FINDINGS: Vascular Findings:

Abdominal aorta: Scattered minimal eccentric calcified plaque within
a normal caliber abdominal aorta. No abdominal aortic dissection or
periaortic stranding.

Celiac artery: Widely patent ; conventional branching pattern.

SMA: There is a minimal amount of eccentric noncalcified
atherosclerotic plaque involving the right side of the origin of the
SMA (image 57, series 5, not resulting in hemodynamically
significant stenosis. Conventional branching pattern.

Right Renal artery: Solitary with early bifurcation with tiny
proximal branch vessel supplying the inferior pole the right kidney.
The right renal artery is widely patent without hemodynamically
significant stenosis. No evidence of dissection, vessel irregularity
or perivascular stranding.

Left Renal artery: Solitary with early bifurcation. Widely patent
without hemodynamically significant stenosis. No evidence of
dissection, vessel irregularity or perivascular stranding.

IMA: Patent

Review of the MIP images confirms the above findings.

--------------------------------------------------------------------------------

Nonvascular Findings:

There is symmetric enhancement and excretion of the bilateral
kidneys. The bilateral kidneys are normal and symmetric in size, the
right measuring approximately 11.6 cm in length (sagittal image 56,
series 602), the left measuring 12.0 cm (image 108, series 602).
Note is made of an approximately 0.6 cm hypo attenuating lesion
arising from the anterior aspect of the superior pole the right
kidney (image 30, series 8) which is too small to actually
characterize though favored to represent a renal cyst as was
suggested on preceding abdominal ultrasound. Incidental note is made
of partial duplication of the right renal collecting system with 2
discrete right-sided ureters seen the level of the superior aspect
of the right pelvis (represent and axial images 83 and 118, series
5; coronal image 62, series 601). No definite renal stones seen on
this postcontrast examination. No urinary obstruction or perinephric
stranding.

Normal hepatic contour. No discrete hepatic lesions. The gallbladder
is underdistended are otherwise normal. No definite radiopaque
gallstones. No intra hepatic biliary duct dilatation. No ascites.

Normal appearance of the bilateral adrenal glands, pancreas and
spleen.

Scattered minimal colonic diverticulosis without evidence of
diverticulitis. Moderate colonic stool burden without evidence of
obstruction. Normal appearance of the appendix.

Shotty porta hepatis lymph nodes ir not enlarged by size criteria.
No retroperitoneal or mesenteric adenopathy within the imaged
abdomen.

Limited visualization the lower thorax demonstrates minimal
dependent ground-glass atelectasis within right lower lobe. No focal
airspace opacities. No pleural effusion.

Normal heart size.  No pericardial effusion.

No acute or aggressive osseous abnormalities. Moderate to severe DDD
at L5-S1 with disc space height loss, endplate irregularity,
sclerosis and small posteriorly directed disc osteophyte complexes
at this location. Regional soft tissues appear normal.
IMPRESSION: 1. No evidence of renal artery stenosis or vessel irregularity to
suggest FMD. Note, the bilateral kidneys appear symmetric in size,
the right measuring approximately 11.6 cm in length, the left
measuring approximately 12 cm.
2. Incidental note is made of partial duplication of the right renal
collecting system with 2 discrete ureters seen the level of the
upper pelvis.
3. Moderate to severe DDD of L5-S1.

## 2016-02-02 ENCOUNTER — Encounter (INDEPENDENT_AMBULATORY_CARE_PROVIDER_SITE_OTHER): Payer: Self-pay

## 2016-02-02 ENCOUNTER — Ambulatory Visit (INDEPENDENT_AMBULATORY_CARE_PROVIDER_SITE_OTHER): Payer: Medicare Other

## 2016-02-02 DIAGNOSIS — R002 Palpitations: Secondary | ICD-10-CM | POA: Diagnosis not present

## 2016-02-17 ENCOUNTER — Ambulatory Visit: Payer: Medicare Other

## 2016-03-13 ENCOUNTER — Ambulatory Visit: Payer: Medicare Other

## 2016-05-11 ENCOUNTER — Other Ambulatory Visit: Payer: Self-pay | Admitting: Family Medicine

## 2016-05-11 DIAGNOSIS — G8929 Other chronic pain: Secondary | ICD-10-CM

## 2016-05-11 DIAGNOSIS — M545 Low back pain: Secondary | ICD-10-CM

## 2016-05-17 ENCOUNTER — Ambulatory Visit
Admission: RE | Admit: 2016-05-17 | Discharge: 2016-05-17 | Disposition: A | Discharge status: Home / Self Care | Payer: Medicare Other | Source: Ambulatory Visit | Attending: Family Medicine | Admitting: Family Medicine

## 2016-05-17 DIAGNOSIS — G8929 Other chronic pain: Secondary | ICD-10-CM

## 2016-05-17 DIAGNOSIS — M545 Low back pain: Secondary | ICD-10-CM

## 2016-05-19 ENCOUNTER — Ambulatory Visit: Payer: Medicare Other

## 2016-06-08 ENCOUNTER — Ambulatory Visit
Admission: RE | Admit: 2016-06-08 | Discharge: 2016-06-08 | Disposition: A | Discharge status: Home / Self Care | Payer: Medicare Other | Source: Ambulatory Visit | Attending: Family Medicine | Admitting: Family Medicine

## 2016-06-08 DIAGNOSIS — Z1231 Encounter for screening mammogram for malignant neoplasm of breast: Secondary | ICD-10-CM

## 2016-08-09 ENCOUNTER — Other Ambulatory Visit: Payer: Self-pay | Admitting: Anesthesiology

## 2016-08-09 ENCOUNTER — Ambulatory Visit
Admission: RE | Admit: 2016-08-09 | Discharge: 2016-08-09 | Disposition: A | Discharge status: Home / Self Care | Payer: Medicare Other | Source: Ambulatory Visit | Attending: Anesthesiology | Admitting: Anesthesiology

## 2016-08-09 DIAGNOSIS — M542 Cervicalgia: Secondary | ICD-10-CM

## 2016-11-09 ENCOUNTER — Other Ambulatory Visit: Payer: Self-pay | Admitting: Family Medicine

## 2016-11-09 DIAGNOSIS — R1084 Generalized abdominal pain: Secondary | ICD-10-CM

## 2016-11-15 ENCOUNTER — Ambulatory Visit
Admission: RE | Admit: 2016-11-15 | Discharge: 2016-11-15 | Disposition: A | Discharge status: Home / Self Care | Payer: Medicare Other | Source: Ambulatory Visit | Attending: Family Medicine | Admitting: Family Medicine

## 2016-11-15 DIAGNOSIS — R1084 Generalized abdominal pain: Secondary | ICD-10-CM

## 2016-11-15 MED ORDER — IOPAMIDOL (ISOVUE-300) INJECTION 61%
100.0000 mL | Freq: Once | INTRAVENOUS | Status: AC | PRN
  Administered 2016-11-15: 100 mL via INTRAVENOUS

## 2016-12-06 HISTORY — PX: OTHER SURGICAL HISTORY: SHX169

## 2017-01-13 ENCOUNTER — Encounter (HOSPITAL_COMMUNITY): Payer: Self-pay

## 2017-01-13 ENCOUNTER — Emergency Department (HOSPITAL_COMMUNITY): Payer: Medicare Other

## 2017-01-13 ENCOUNTER — Emergency Department (HOSPITAL_COMMUNITY)
Admission: EM | Admit: 2017-01-13 | Discharge: 2017-01-13 | Disposition: A | Discharge status: Home / Self Care | Payer: Medicare Other | Attending: Emergency Medicine | Admitting: Emergency Medicine

## 2017-01-13 DIAGNOSIS — M5442 Lumbago with sciatica, left side: Secondary | ICD-10-CM | POA: Diagnosis not present

## 2017-01-13 DIAGNOSIS — G8918 Other acute postprocedural pain: Secondary | ICD-10-CM | POA: Diagnosis not present

## 2017-01-13 DIAGNOSIS — F17210 Nicotine dependence, cigarettes, uncomplicated: Secondary | ICD-10-CM | POA: Diagnosis not present

## 2017-01-13 DIAGNOSIS — Z79899 Other long term (current) drug therapy: Secondary | ICD-10-CM | POA: Insufficient documentation

## 2017-01-13 DIAGNOSIS — I10 Essential (primary) hypertension: Secondary | ICD-10-CM | POA: Insufficient documentation

## 2017-01-13 MED ORDER — PREDNISONE 10 MG (21) PO TBPK: ORAL_TABLET | Freq: Every day | ORAL | 0 refills | Status: DC

## 2017-01-13 NOTE — ED Provider Notes (Signed)
[facility] DEPT Provider Note   CSN: [id number] Arrival date & time: 01/13/17  1115     History   Chief Complaint Chief Complaint  Patient presents with  . Back Pain    HPI [name] is a 61 y.o. female who presents with back pain. PMH significant for chronic back pain, arthritis, lumbar spinal stenosis and degenerative disc disease. She had recent L4-S1 anterior posterior fusion by Dr. Patrice Paradise on 12/25/16 (2 weeks and 5 days post op). She has had worsening pain over the past week. It is over the left side of her back and left groin where incisions were made. Feels like a pulling. She reports associated numbness of the left buttock. She denies fever, vomiting, dysuria, bowel/bladder incontinence, inability to walk. She stopped her Oxycodone today because she wanted to "see where the pain was coming from". She has been taking Morphine and muscle relaxers as prescribed.      HPI  Past Medical History:  Diagnosis Date  . Anxiety   . Anxiety   . Arthritis   . Hepatitis C   . Hypertension     Patient Active Problem List   Diagnosis Date Noted  . Palpitations 02/02/2016  . SYNCOPE AND COLLAPSE 10/23/2008  . FIBROIDS, UTERUS 10/13/2008  . ANXIETY DISORDER 10/13/2008  . DEPRESSION 10/13/2008  . HYPERTENSION 10/13/2008  . 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 UNSPECIFIED PAROXYSMAL TACHYCARDIA 10/13/2008  . ASTHMA 10/13/2008  . GERD 10/13/2008  . ENDOMETRIOSIS 10/13/2008  . DEGENERATIVE JOINT DISEASE 10/13/2008  . TACHYCARDIA 10/13/2008    Past Surgical History:  Procedure Laterality Date  . BACK SURGERY    . ROTATOR CUFF REPAIR    . Spinal Fusion      OB History    No data available       Home Medications    Prior to Admission medications   Medication Sig Start Date End Date Taking? Authorizing Provider  acetaminophen (TYLENOL) 325 MG tablet Take 650 mg by mouth every 6 (six) hours as needed for moderate pain.    [provider]  albuterol  (PROVENTIL HFA;VENTOLIN HFA) 108 (90 BASE) MCG/ACT inhaler Inhale 1-2 puffs into the lungs every 6 (six) hours as needed for wheezing or shortness of breath.    [provider]  ALPRAZolam Duanne Moron) 1 MG tablet Take 2 mg by mouth at bedtime. Takes 2mg  at bedtime, can take 0.5mg  as needed for anxiety 05/25/14   [provider]  Biotin 3 MG TABS Take 3 mg by mouth daily.    [provider]  CHANTIX CONTINUING MONTH PAK 1 MG tablet Take 1 mg by mouth 2 (two) times daily. 07/17/14   [provider]  Cyanocobalamin (B-12 PO) Take 1 tablet by mouth daily.    [provider]  diclofenac (VOLTAREN) 75 MG EC tablet Take 75 mg by mouth as needed. For pain 06/22/14   [provider]  HUMIRA 40 MG/0.8ML PSKT Take 40 mg by mouth every Sunday. 07/20/14   [provider]  Ledipasvir-Sofosbuvir 90-400 MG TABS Take 1 tablet by mouth daily.    [provider]  lisinopril (PRINIVIL,ZESTRIL) 30 MG tablet Take 30 mg by mouth daily. 07/23/14   [provider]  Multiple Vitamin (MULTIVITAMIN WITH MINERALS) TABS tablet Take 1 tablet by mouth daily.    [provider]  oxycodone (ROXICODONE) 30 MG immediate release tablet Take 30 mg by mouth as needed. For pain 06/25/14   [provider]  SYMBICORT 160-4.5 MCG/ACT inhaler  Inhale 1 puff into the lungs 2 (two) times daily. 07/13/14   [provider]  verapamil (VERELAN PM) 240 MG 24 hr capsule Take 240 mg by mouth at bedtime. 06/23/14   [provider]    Family History No family history on file.  Social History Social History  Substance Use Topics  . Smoking status: Current Every Day Smoker    Packs/day: 0.10    Types: Cigarettes  . Smokeless tobacco: Never Used  . Alcohol use Yes     Comment: Occasional      Allergies   Cymbalta [duloxetine hcl] and Cyclobenzaprine hcl   Review of Systems Review of Systems  Constitutional: Negative for fever.    Respiratory: Negative for shortness of breath.   Cardiovascular: Negative for chest pain.  Gastrointestinal: Positive for abdominal pain (over incision). Negative for nausea and vomiting.  Genitourinary: Negative for difficulty urinating and enuresis.  Musculoskeletal: Positive for back pain and myalgias. Negative for gait problem.  Neurological: Positive for numbness. Negative for weakness.  All other systems reviewed and are negative.    Physical Exam Updated Vital Signs BP (!) 142/84   Pulse 72   Temp 98.2 F (36.8 C) (Oral)   Resp 18   Ht 5\' 9"  (1.753 m)   Wt 81.6 kg (180 lb)   SpO2 100%   BMI 26.58 kg/m   Physical Exam  Constitutional: She is oriented to person, place, and time. She appears well-developed and well-nourished. She appears distressed (in pain).  HENT:  Head: Normocephalic and atraumatic.  Eyes: Pupils are equal, round, and reactive to light. Conjunctivae are normal. Right eye exhibits no discharge. Left eye exhibits no discharge. No scleral icterus.  Neck: Normal range of motion.  Cardiovascular: Normal rate.   Pulmonary/Chest: Effort normal. No respiratory distress.  Abdominal: She exhibits no distension.  Musculoskeletal:  Back: Inspection: Healing incision over left lower back. There is a healing incision over left lower pelvic region from anterior approach. Palpation: Lumbar midline spinal tenderness with significant left sided back tenderness with mild palpation. Strength: 5/5 in lower extremities and normal plantar and dorsiflexion Sensation: Intact sensation with light touch in lower extremities bilaterally Reflexes: Patellar reflex is 2+ bilaterally Gait: Ambulatory   Neurological: She is alert and oriented to person, place, and time.  Skin: Skin is warm and dry.  Psychiatric: She has a normal mood and affect. Her behavior is normal.  Nursing note and vitals reviewed.    ED Treatments / Results  Labs (all labs ordered are listed, but only  abnormal results are displayed) Labs Reviewed - No data to display  EKG  EKG Interpretation None       Radiology Ct Lumbar Spine Wo Contrast  Result Date: 01/13/2017 CLINICAL DATA:  Low back pain, left groin pain, and left lower extremity radiculopathy. Lumbar fusion 12/25/2016. EXAM: CT LUMBAR SPINE WITHOUT CONTRAST TECHNIQUE: Multidetector CT imaging of the lumbar spine was performed without intravenous contrast administration. Multiplanar CT image reconstructions were also generated. COMPARISON:  Lumbar spine MRI 05/17/2016 FINDINGS: Segmentation: 5 lumbar type vertebrae. Alignment: New slight anterolisthesis of L4 on L5 2-3 mm. Vertebrae: No acute fracture or destructive osseous lesion. Interval interbody and posterior fusion at L4-5 and L5-S1. Left-sided pedicle screws are in place at L4 and L5 and appear well positioned. A left-sided posterior screw at S1 mildly breaches the lateral cortex of the left S1 neural foramen. Interbody implants and anterior fusion plates and screws are present at each level. The anterior screw  at S1 runs along the anterior margin of the S1 vertebral body in an extraosseous fashion. No significant lucency is seen about any of the anterior or posterior screws to indicate loosening or infection. Paraspinal and other soft tissues: Mild abdominal aortic atherosclerosis without aneurysm. 2.4 x 1.4 cm subcutaneous fluid collection to the left of midline at L4-5. Disc levels: T12-L1: Negative. L1-2: Mild to moderate left facet arthrosis. No evidence of stenosis. L2-3: Moderate to severe right and mild left facet arthrosis. No evidence of stenosis. L3-4: Moderate to severe facet and ligamentum flavum hypertrophy and minimal disc bulging result in mild spinal stenosis. No neural foraminal stenosis. L4-5: Interval fusion with associated streak artifact limiting assessment of the spinal canal. Moderate to severe facet hypertrophy without significant osseous spinal stenosis. No  evidence of significant neural foraminal stenosis. L5-S1: Prior left laminectomy. Interval fusion. No significant osseous spinal stenosis. Mild-to-moderate facet arthrosis and endplate spurring result in mild left neural foraminal stenosis. IMPRESSION: 1. Interval L4-S1 fusion. The anterior S1 screw courses along the anterior margin of the vertebral body, not within bone. The posterior left S1 screw mildly breaches the left S1 neural foramen. 2. No evidence of acute osseous abnormality. 3.  Aortic Atherosclerosis (ICD10-I70.0). Electronically Signed   By: Logan Bores M.D.   On: 01/13/2017 15:16    Procedures Procedures (including critical care time)  Medications Ordered in ED Medications - No data to display   Initial Impression / Assessment and Plan / ED Course  I have reviewed the triage vital signs and the nursing notes.  Pertinent labs & imaging results that were available during my care of the patient were reviewed by me and considered in my medical decision making (see chart for details).  61 year old female presents with pain after back surgery 2.5 weeks ago. She has no significant weakness or altered sensation on exam. She has normal reflexes and is ambulatory. CT shows:  1. Interval L4-S1 fusion. The anterior S1 screw courses along the anterior margin of the vertebral body, not within bone. The posterior left S1 screw mildly breaches the left S1 neural foramen. 2. No evidence of acute osseous abnormality. 3.  Aortic Atherosclerosis. Scan was reviewed by Dr. Patrice Paradise who recommends f/u in the office.  Patient will call Dr. Towanda Malkin office on Monday to follow up. Will d/c with steroid taper.   Final Clinical Impressions(s) / ED Diagnoses   Final diagnoses:  Post-op pain  Acute left-sided low back pain with left-sided sciatica    New Prescriptions New Prescriptions   No medications on file     Iris Pert 01/14/17 1779    Virgel Manifold, MD 01/15/17 1200

## 2017-01-13 NOTE — Discharge Instructions (Signed)
Please restart your home pain medicine and continue muscle relaxers Start steroids pack Call Dr. Towanda Malkin office Monday for an appointment

## 2017-01-13 NOTE — ED Notes (Signed)
Pt was stable and states understanding of discharge instructions.

## 2017-01-13 NOTE — ED Triage Notes (Signed)
Per Pt, Pt has a Spinal Fusion completed on 8/20. Pt reports about three days ago noting a "pulling" feeling in her lower back along with right hip pain and left leg numbness. Pt is supposed to have follow-up in a couple weeks, but has been unable to wait.

## 2017-05-22 ENCOUNTER — Other Ambulatory Visit: Payer: Self-pay | Admitting: Orthopaedic Surgery

## 2017-05-22 DIAGNOSIS — M542 Cervicalgia: Secondary | ICD-10-CM

## 2017-06-02 ENCOUNTER — Other Ambulatory Visit: Payer: Medicare Other

## 2017-06-16 ENCOUNTER — Emergency Department (HOSPITAL_COMMUNITY): Payer: Medicare Other

## 2017-06-16 ENCOUNTER — Other Ambulatory Visit: Payer: Self-pay

## 2017-06-16 ENCOUNTER — Encounter (HOSPITAL_COMMUNITY): Payer: Self-pay

## 2017-06-16 ENCOUNTER — Emergency Department (HOSPITAL_COMMUNITY)
Admission: EM | Admit: 2017-06-16 | Discharge: 2017-06-16 | Disposition: A | Discharge status: Home / Self Care | Payer: Medicare Other | Attending: Emergency Medicine | Admitting: Emergency Medicine

## 2017-06-16 DIAGNOSIS — J45909 Unspecified asthma, uncomplicated: Secondary | ICD-10-CM | POA: Insufficient documentation

## 2017-06-16 DIAGNOSIS — Y929 Unspecified place or not applicable: Secondary | ICD-10-CM | POA: Diagnosis not present

## 2017-06-16 DIAGNOSIS — S6010XA Contusion of unspecified finger with damage to nail, initial encounter: Secondary | ICD-10-CM | POA: Diagnosis not present

## 2017-06-16 DIAGNOSIS — S6991XA Unspecified injury of right wrist, hand and finger(s), initial encounter: Secondary | ICD-10-CM | POA: Diagnosis present

## 2017-06-16 DIAGNOSIS — R0789 Other chest pain: Secondary | ICD-10-CM

## 2017-06-16 DIAGNOSIS — W230XXA Caught, crushed, jammed, or pinched between moving objects, initial encounter: Secondary | ICD-10-CM | POA: Diagnosis not present

## 2017-06-16 DIAGNOSIS — Y999 Unspecified external cause status: Secondary | ICD-10-CM | POA: Insufficient documentation

## 2017-06-16 DIAGNOSIS — Z79899 Other long term (current) drug therapy: Secondary | ICD-10-CM | POA: Diagnosis not present

## 2017-06-16 DIAGNOSIS — I10 Essential (primary) hypertension: Secondary | ICD-10-CM | POA: Diagnosis not present

## 2017-06-16 DIAGNOSIS — F17210 Nicotine dependence, cigarettes, uncomplicated: Secondary | ICD-10-CM | POA: Insufficient documentation

## 2017-06-16 DIAGNOSIS — Y9389 Activity, other specified: Secondary | ICD-10-CM | POA: Diagnosis not present

## 2017-06-16 LAB — I-STAT BETA HCG BLOOD, ED (MC, WL, AP ONLY): I-stat hCG, quantitative: <5 m[IU]/mL (ref <5)

## 2017-06-16 LAB — BASIC METABOLIC PANEL
Anion gap: 11 (ref 5–15)
BUN: 20 mg/dL (ref 6–20)
CO2: 25 mmol/L (ref 22–32)
Calcium: 9.6 mg/dL (ref 8.9–10.3)
Chloride: 104 mmol/L (ref 101–111)
Creatinine, Ser: 1.18 mg/dL — ABNORMAL HIGH (ref 0.44–1.00)
GFR calc Af Amer: 57 mL/min — ABNORMAL LOW (ref >60)
GFR calc non Af Amer: 49 mL/min — ABNORMAL LOW (ref >60)
Glucose, Bld: 89 mg/dL (ref 65–99)
Potassium: 3.8 mmol/L (ref 3.5–5.1)
Sodium: 140 mmol/L (ref 135–145)

## 2017-06-16 LAB — TROPONIN I: Troponin I: <0.03 ng/mL (ref <0.03)

## 2017-06-16 LAB — CBC
HCT: 43.7 % (ref 36.0–46.0)
Hemoglobin: 14.8 g/dL (ref 12.0–15.0)
MCH: 32.1 pg (ref 26.0–34.0)
MCHC: 33.9 g/dL (ref 30.0–36.0)
MCV: 94.8 fL (ref 78.0–100.0)
Platelets: 286 10*3/uL (ref 150–400)
RBC: 4.61 MIL/uL (ref 3.87–5.11)
RDW: 13 % (ref 11.5–15.5)
WBC: 4.2 10*3/uL (ref 4.0–10.5)

## 2017-06-16 LAB — D-DIMER, QUANTITATIVE: D-Dimer, Quant: 0.34 ug/mL-FEU (ref 0.00–0.50)

## 2017-06-16 LAB — I-STAT TROPONIN, ED: Troponin i, poc: 0 ng/mL (ref 0.00–0.08)

## 2017-06-16 MED ORDER — ASPIRIN 81 MG PO CHEW: 324.0000 mg | CHEWABLE_TABLET | Freq: Once | ORAL | Status: DC

## 2017-06-16 NOTE — ED Provider Notes (Signed)
[facility] EMERGENCY DEPARTMENT Provider Note   CSN: [id number] Arrival date & time: 06/16/17  1236   History   Chief Complaint Chief Complaint  Patient presents with  . Chest Pain  . Recurrent Skin Infections  . Finger Injury    HPI [name] is a 62 y.o. female.  HPI    62 year old female presents today with complaints of chest, back, and finger pain.  Patient notes that over the last 3 days she has had left-sided chest pain described as pressure.  She notes it originally started with a sharp pain in the chest, yesterday was improved, but today symptoms again increased in intensity.  She also notes some minor shortness of breath, denies any exertional component to either the shortness of breath or the chest pain.  Patient denies any fever, cough, changes in lower extremity swelling or edema.  Patient denies any history of DVT or PE or any significant risk factors.  She notes no personal cardiac history other than questionable A. fib in the past.  She notes her mother had an MI at the age of 21.  She is a former smoker (30-pack-year history)  with hypertension, no hyperlipidemia or diabetes.  Patient has not taken any medication for this.  Patient does note that the pain is somewhat worsened with deep inspiration.  Patient also reports that she slammed her right index finger in a car door and has had a hematoma underneath the nail with pain at the tip.   Past Medical History:  Diagnosis Date  . Anxiety   . Anxiety   . Arthritis   . Hepatitis C   . Hypertension     Patient Active Problem List   Diagnosis Date Noted  . Palpitations 02/02/2016  . SYNCOPE AND COLLAPSE 10/23/2008  . FIBROIDS, UTERUS 10/13/2008  . ANXIETY DISORDER 10/13/2008  . DEPRESSION 10/13/2008  . HYPERTENSION 10/13/2008  . 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 UNSPECIFIED PAROXYSMAL TACHYCARDIA 10/13/2008  . ASTHMA 10/13/2008  . GERD 10/13/2008  . ENDOMETRIOSIS 10/13/2008  .  DEGENERATIVE JOINT DISEASE 10/13/2008  . TACHYCARDIA 10/13/2008    Past Surgical History:  Procedure Laterality Date  . BACK SURGERY    . ROTATOR CUFF REPAIR    . Spinal Fusion      OB History    No data available       Home Medications    Prior to Admission medications   Medication Sig Start Date End Date Taking? Authorizing Provider  acetaminophen (TYLENOL) 325 MG tablet Take 650 mg by mouth every 6 (six) hours as needed for moderate pain.    [provider]  albuterol (PROVENTIL HFA;VENTOLIN HFA) 108 (90 BASE) MCG/ACT inhaler Inhale 1-2 puffs into the lungs every 6 (six) hours as needed for wheezing or shortness of breath.    [provider]  ALPRAZolam Duanne Moron) 1 MG tablet Take 2 mg by mouth at bedtime. Takes 2mg  at bedtime, can take 0.5mg  as needed for anxiety 05/25/14   [provider]  Biotin 3 MG TABS Take 3 mg by mouth daily.    [provider]  CHANTIX CONTINUING MONTH PAK 1 MG tablet Take 1 mg by mouth 2 (two) times daily. 07/17/14   [provider]  Cyanocobalamin (B-12 PO) Take 1 tablet by mouth daily.    [provider]  diclofenac (VOLTAREN) 75 MG EC tablet Take 75 mg by mouth as needed. For pain 06/22/14   [provider]  HUMIRA 40 MG/0.8ML PSKT  Take 40 mg by mouth every Sunday. 07/20/14   [provider]  Ledipasvir-Sofosbuvir 90-400 MG TABS Take 1 tablet by mouth daily.    [provider]  lisinopril (PRINIVIL,ZESTRIL) 30 MG tablet Take 30 mg by mouth daily. 07/23/14   [provider]  Multiple Vitamin (MULTIVITAMIN WITH MINERALS) TABS tablet Take 1 tablet by mouth daily.    [provider]  oxycodone (ROXICODONE) 30 MG immediate release tablet Take 30 mg by mouth as needed. For pain 06/25/14   [provider]  predniSONE (STERAPRED UNI-PAK 21 TAB) 10 MG (21) TBPK tablet Take by mouth daily. Take 6 tabs by mouth daily  for 2 days, then 5 tabs for 2 days, then 4 tabs  for 2 days, then 3 tabs for 2 days, 2 tabs for 2 days, then 1 tab by mouth daily for 2 days 01/13/17   Recardo Evangelist, PA-C  SYMBICORT 160-4.5 MCG/ACT inhaler Inhale 1 puff into the lungs 2 (two) times daily. 07/13/14   [provider]  verapamil (VERELAN PM) 240 MG 24 hr capsule Take 240 mg by mouth at bedtime. 06/23/14   [provider]    Family History No family history on file.  Social History Social History   Tobacco Use  . Smoking status: Current Every Day Smoker    Packs/day: 0.10    Types: Cigarettes  . Smokeless tobacco: Never Used  Substance Use Topics  . Alcohol use: Yes    Comment: Occasional   . Drug use: No     Allergies   Cymbalta [duloxetine hcl] and Cyclobenzaprine hcl   Review of Systems Review of Systems  All other systems reviewed and are negative.   Physical Exam Updated Vital Signs BP 124/73 (BP Location: Left Arm)   Pulse 72   Temp 97.7 F (36.5 C) (Oral)   Resp 18   Wt 81.6 kg (180 lb)   SpO2 100%   BMI 26.58 kg/m   Physical Exam  Constitutional: She is oriented to person, place, and time. She appears well-developed and well-nourished.  HENT:  Head: Normocephalic and atraumatic.  Eyes: Conjunctivae are normal. Pupils are equal, round, and reactive to light. Right eye exhibits no discharge. Left eye exhibits no discharge. No scleral icterus.  Neck: Normal range of motion. No JVD present. No tracheal deviation present.  Cardiovascular: Normal rate, regular rhythm, normal heart sounds and intact distal pulses. Exam reveals no gallop and no friction rub.  No murmur heard. Pulmonary/Chest: Effort normal and breath sounds normal. No stridor. No respiratory distress. She has no wheezes. She has no rales. She exhibits no tenderness.  Musculoskeletal: She exhibits no edema.  Right index finger with subungual hematoma and bruising noted along the palmar aspect of the distal phalanx  Neurological: She is alert and oriented to  person, place, and time. Coordination normal.  Skin: Skin is warm.  Psychiatric: She has a normal mood and affect. Her behavior is normal. Judgment and thought content normal.  Nursing note and vitals reviewed.    ED Treatments / Results  Labs (all labs ordered are listed, but only abnormal results are displayed) Labs Reviewed  BASIC METABOLIC PANEL - Abnormal; Notable for the following components:      Result Value   Creatinine, Ser 1.18 (*)    GFR calc non Af Amer 49 (*)    GFR calc Af Amer 57 (*)    All other components within normal limits  CBC  D-DIMER, QUANTITATIVE (NOT AT [facility])  TROPONIN I  I-STAT TROPONIN, ED  I-STAT BETA HCG BLOOD, ED ([facility], [facility], [facility] ONLY)    EKG  EKG Interpretation  Date/Time:  Saturday June 16 2017 12:42:18 EST Ventricular Rate:  66 PR Interval:  140 QRS Duration: 76 QT Interval:  390 QTC Calculation: 408 R Axis:   73 Text Interpretation:  Undetermined rhythm Otherwise normal ECG appears to be sinus rhythm but baseline artifact makes it unclear Confirmed by Alfonzo Beers [phone]) on 06/16/2017 5:57:06 PM       Radiology Dg Chest 2 View  Result Date: 06/16/2017 CLINICAL DATA:  Cough, chest pain, shortness of Breath EXAM: CHEST  2 VIEW COMPARISON:  12/23/2008 FINDINGS: Heart and mediastinal contours are within normal limits. No focal opacities or effusions. No acute bony abnormality. IMPRESSION: No active cardiopulmonary disease. Electronically Signed   By: Rolm Baptise M.D.   On: 06/16/2017 13:14   Dg Finger Index Right  Result Date: 06/16/2017 CLINICAL DATA:  Distal right index finger injury and pain. Pt states she slammed the finger in her car door x1 week ago and had the same injury x1 month ago as well.Nailbed is bruises but intact. EXAM: RIGHT INDEX FINGER 2+V COMPARISON:  06/16/2011 FINDINGS: No fracture.  No bone lesion. Joints are normally spaced and aligned. Mild distal soft tissue swelling.  No radiopaque foreign body. IMPRESSION: 1. No  fracture or dislocation. Electronically Signed   By: Lajean Manes M.D.   On: 06/16/2017 17:13    Procedures Procedures (including critical care time)  Medications Ordered in ED Medications  aspirin chewable tablet 324 mg (not administered)     Initial Impression / Assessment and Plan / ED Course  I have reviewed the triage vital signs and the nursing notes.  Pertinent labs & imaging results that were available during my care of the patient were reviewed by me and considered in my medical decision making (see chart for details).    Final Clinical Impressions(s) / ED Diagnoses   Final diagnoses:  Subungual hematoma of digit of hand, initial encounter  Atypical chest pain    Labs: D-dimer, troponin, i-STAT troponin, beta-hCG, BMP, CBC  Imaging: DG chest 2 view, ED EKG  Consults:  Therapeutics: asa  Discharge Meds:   Assessment/Plan: 26 YOF presents today with complaints of chest pain. Pt is very comfortable throughout exam with no signs of discomfort or distress. Her pain in not typical of ACS, DVT, dissection, or any other acute life threatening process. She has a heart score of 2 with three days of symptoms. Delta trop normal, EKG reassuring without acute changes. She is low risk wells with negative d dimer. No other acute findings on her exam today that would require further evaluation. Both the patient and her husband agitated that workup has taken so long and would like to leave. I highly encouraged patient to follow up as an outpatient for repeat evaluation. Pt also has a contusion to her index finger. Care instructions given for this as well.  Pt given strict return precautions. No further questions or concerns at the time of discharge.      ED Discharge Orders    None       Okey Regal, Hershal Coria 06/16/17 1809    Pixie Casino, MD 06/16/17 [phone]

## 2017-06-16 NOTE — ED Triage Notes (Signed)
Pt presents to the ed with complaints of sudden stabbing central chest pain that radiates to her back accompanied with dizziness x 2 days.  Denies shortness of breath.  No weakness or neuro deficits present.  The patient also has a boil on her right groin and a finger injury from shutting it in a door.

## 2017-06-16 NOTE — Discharge Instructions (Signed)
Please read attached information. If you experience any new or worsening signs or symptoms please return to the emergency room for evaluation. Please follow-up with your primary care provider or specialist as discussed.  °

## 2017-06-16 NOTE — ED Notes (Signed)
Pt stated that chest pain is getting worse. Pt's vitals reassessed. Informed Scarlett - RN.

## 2017-07-24 ENCOUNTER — Other Ambulatory Visit (HOSPITAL_COMMUNITY)
Admission: RE | Admit: 2017-07-24 | Discharge: 2017-07-24 | Disposition: A | Discharge status: Home / Self Care | Payer: Medicare Other | Source: Ambulatory Visit | Attending: Obstetrics and Gynecology | Admitting: Obstetrics and Gynecology

## 2017-07-24 ENCOUNTER — Other Ambulatory Visit: Payer: Self-pay | Admitting: Obstetrics and Gynecology

## 2017-07-24 DIAGNOSIS — Z124 Encounter for screening for malignant neoplasm of cervix: Secondary | ICD-10-CM | POA: Insufficient documentation

## 2017-07-26 LAB — CYTOLOGY - PAP
Diagnosis: NEGATIVE
HPV: NOT DETECTED

## 2017-09-26 ENCOUNTER — Telehealth: Payer: Self-pay

## 2017-09-26 NOTE — Telephone Encounter (Signed)
SENT REFERRAL TO SCHEDULING 

## 2017-10-17 NOTE — Progress Notes (Signed)
Cardiology Office Note   Date:  10/18/2017   ID:  [name], DOB 1956/04/10, MRN [id number]  PCP:  Shirline Frees, MD  Cardiologist:   No primary care provider on file. Referring:  Shirline Frees, MD    Chief Complaint  Patient presents with  . Palpitations  . Chest Pain     History of Present Illness: [name] is a 62 y.o. female who is referred by Shirline Frees, MD for evaluation of palpitations and chest pain.  She has a history of wide-complex tachycardia that was seen on a monitor in the past but in 2010 an EP study could not identify an inducible or a believable rhythm.  She had a cardiac catheterization with normal coronaries at that time..  She did wear a monitor in 2017 which demonstrated NSR and rare PVCs.    She returns for follow-up because she has been having chest discomfort.  This is somewhat different from 2000.  She says that it is more frequent and more intense.  She gets some midsternal discomfort that can be heavy or sharp.  It lasts for a few to several seconds.  She might get dizzy.  She has to deep breathe.  It comes and goes spontaneously and she cannot bring it on.  It is not necessarily happening with palpitations.  There is no radiation to her jaw or to her arms though she has chronic left shoulder pain.  She does get nauseated.  She says she has sweats.  Is not a positional discomfort.  She walks at work and cannot bring this on.  She also gets palpitations.  These are a rapid heart rate that last for several seconds at a time.  She does not think that the last monitor captured these because she was unable to wear long enough because she was working.  She has not had any syncope although she gets dizzy.   Past Medical History:  Diagnosis Date  . Anxiety   . Hepatitis C   . Hypertension   . Osteoarthritis   . RA (rheumatoid arthritis) ([place])     Past Surgical History:  Procedure Laterality Date  . BACK SURGERY    . ROTATOR CUFF  REPAIR    . Spinal Fusion       Current Outpatient Medications  Medication Sig Dispense Refill  . acetaminophen (TYLENOL) 325 MG tablet Take 650 mg by mouth every 6 (six) hours as needed for moderate pain.    Marland Kitchen albuterol (PROVENTIL HFA;VENTOLIN HFA) 108 (90 BASE) MCG/ACT inhaler Inhale 1-2 puffs into the lungs every 6 (six) hours as needed for wheezing or shortness of breath.    . ALPRAZolam (XANAX) 1 MG tablet Take 2 mg by mouth at bedtime. Takes 2mg  at bedtime, can take 0.5mg  as needed for anxiety  0  . amLODipine (NORVASC) 10 MG tablet Take 10 mg by mouth daily.    . Cyanocobalamin (B-12 PO) Take 1 tablet by mouth daily.    Marland Kitchen HUMIRA 40 MG/0.8ML PSKT Take 40 mg by mouth every Sunday.  6  . irbesartan (AVAPRO) 300 MG tablet Take 300 mg by mouth daily.  3  . Ledipasvir-Sofosbuvir 90-400 MG TABS Take 1 tablet by mouth daily.    . Multiple Vitamin (MULTIVITAMIN WITH MINERALS) TABS tablet Take 1 tablet by mouth daily.    . naproxen sodium (ALEVE) 220 MG tablet Take 220 mg by mouth daily.    . Oxycodone HCl 20 MG TABS Take 20 mg  by mouth daily.     No current facility-administered medications for this visit.     Allergies:   Cymbalta [duloxetine hcl] and Cyclobenzaprine hcl    Social History:  The patient  reports that she has been smoking cigarettes.  She has been smoking about 0.10 packs per day. She has never used smokeless tobacco. She reports that she drinks alcohol. She reports that she does not use drugs.   Family History:  The patient's family history includes Atrial fibrillation in her sister; Heart disease in her mother; Hypertension in her mother; Pancreatitis in her father.    ROS:  Please see the history of present illness.   Otherwise, review of systems are positive for none.   All other systems are reviewed and negative.    PHYSICAL EXAM: VS:  BP 110/60 (BP Location: Left Arm, Patient Position: Sitting, Cuff Size: Large)   Pulse 92   Ht 5\' 9"  (1.753 m)   Wt 213 lb  (96.6 kg)   BMI 31.45 kg/m  , BMI Body mass index is 31.45 kg/m. GENERAL:  Well appearing HEENT:  Pupils equal round and reactive, fundi not visualized, oral mucosa unremarkable NECK:  No jugular venous distention, waveform within normal limits, carotid upstroke brisk and symmetric, no bruits, no thyromegaly LYMPHATICS:  No cervical, inguinal adenopathy LUNGS:  Clear to auscultation bilaterally BACK:  No CVA tenderness CHEST:  Unremarkable HEART:  PMI not displaced or sustained,S1 and S2 within normal limits, no S3, no S4, no clicks, no rubs, soft apical brief systolic murmur that does not increase with Valsalva, no diastolic murmurs ABD:  Flat, positive bowel sounds normal in frequency in pitch, no bruits, no rebound, no guarding, no midline pulsatile mass, no hepatomegaly, no splenomegaly EXT:  2 plus pulses throughout, no edema, no cyanosis no clubbing SKIN:  No rashes no nodules NEURO:  Cranial nerves II through XII grossly intact, motor grossly intact throughout PSYCH:  Cognitively intact, oriented to person place and time    EKG:  EKG is not ordered today. The ekg ordered 09/13/17 demonstrates rate sinus rhythm rate 78, axis within normal limits, intervals within normal limits, no acute ST-T wave changes.   Recent Labs: 06/16/2017: BUN 20; Creatinine, Ser 1.18; Hemoglobin 14.8; Platelets 286; Potassium 3.8; Sodium 140    Lipid Panel No results found for: CHOL, TRIG, HDL, CHOLHDL, VLDL, LDLCALC, LDLDIRECT    Wt Readings from Last 3 Encounters:  10/18/17 213 lb (96.6 kg)  06/16/17 180 lb (81.6 kg)  01/13/17 180 lb (81.6 kg)      Other studies Reviewed: Additional studies/ records that were reviewed today include: Previous cariology records, office records primary care, labe. Review of the above records demonstrates:  Please see elsewhere in the note.     ASSESSMENT AND PLAN:  PALPITATIONS:   I will apply a one-month event monitor.  She thinks her symptoms are more  intense.  Londell Moh will need a 21 day event monitor.  The patients symptoms necessitate an event monitor.  The symptoms are too infrequent to be identified on a Holter monitor.    CHEST PAIN:   I think the pretest probability of obstructive coronary disease is relatively low given the atypical nature but she does have risk factors. I will bring the patient back for a POET (Plain Old Exercise Test). This will allow me to screen for obstructive coronary disease, risk stratify and very importantly provide a prescription for exercise.   Current medicines are reviewed at length  with the patient today.  The patient does not have concerns regarding medicines.  The following changes have been made:  no change  Labs/ tests ordered today include:   Orders Placed This Encounter  Procedures  . CARDIAC EVENT MONITOR  . EXERCISE TOLERANCE TEST (ETT)     Disposition:   FU with me in one month.     Signed, Minus Breeding, MD  10/18/2017 9:25 AM    [facility]

## 2017-10-18 ENCOUNTER — Encounter: Payer: Self-pay | Admitting: Cardiology

## 2017-10-18 ENCOUNTER — Ambulatory Visit (INDEPENDENT_AMBULATORY_CARE_PROVIDER_SITE_OTHER): Payer: Medicare Other | Admitting: Cardiology

## 2017-10-18 VITALS — BP 110/60 | HR 92 | Ht 69.0 in | Wt 213.0 lb

## 2017-10-18 DIAGNOSIS — M069 Rheumatoid arthritis, unspecified: Secondary | ICD-10-CM | POA: Insufficient documentation

## 2017-10-18 DIAGNOSIS — M1990 Unspecified osteoarthritis, unspecified site: Secondary | ICD-10-CM

## 2017-10-18 DIAGNOSIS — F419 Anxiety disorder, unspecified: Secondary | ICD-10-CM | POA: Insufficient documentation

## 2017-10-18 DIAGNOSIS — I10 Essential (primary) hypertension: Secondary | ICD-10-CM

## 2017-10-18 DIAGNOSIS — R079 Chest pain, unspecified: Secondary | ICD-10-CM

## 2017-10-18 DIAGNOSIS — B1920 Unspecified viral hepatitis C without hepatic coma: Secondary | ICD-10-CM | POA: Insufficient documentation

## 2017-10-18 DIAGNOSIS — R002 Palpitations: Secondary | ICD-10-CM

## 2017-10-18 NOTE — Patient Instructions (Addendum)
Medication Instructions:  Continue current medications  If you need a refill on your cardiac medications before your next appointment, please call your pharmacy.  Labwork: None Ordered   Testing/Procedures: Your physician has requested that you have an exercise tolerance test. For further information please visit [email or web address]. Please also follow instruction sheet, as given.  Your physician has recommended that you wear an event monitor for 1 Month. Event monitors are medical devices that record the heart's electrical activity. Doctors most often [place] these monitors to diagnose arrhythmias. Arrhythmias are problems with the speed or rhythm of the heartbeat. The monitor is a small, portable device. You can wear one while you do your normal daily activities. This is usually used to diagnose what is causing palpitations/syncope (passing out).   Follow-Up: Your physician wants you to follow-up in: After Test.     Thank you for choosing [facility] at [facility]!!

## 2017-10-25 ENCOUNTER — Telehealth (HOSPITAL_COMMUNITY): Payer: Self-pay

## 2017-10-25 NOTE — Telephone Encounter (Signed)
Encounter complete. 

## 2017-10-30 ENCOUNTER — Other Ambulatory Visit: Payer: Self-pay | Admitting: Cardiology

## 2017-10-30 ENCOUNTER — Ambulatory Visit (HOSPITAL_COMMUNITY)
Admission: RE | Admit: 2017-10-30 | Payer: Medicare Other | Source: Ambulatory Visit | Attending: Cardiology | Admitting: Cardiology

## 2017-10-30 DIAGNOSIS — R42 Dizziness and giddiness: Secondary | ICD-10-CM

## 2017-10-30 DIAGNOSIS — R002 Palpitations: Secondary | ICD-10-CM

## 2017-11-01 ENCOUNTER — Telehealth (HOSPITAL_COMMUNITY): Payer: Self-pay

## 2017-11-01 NOTE — Telephone Encounter (Signed)
Encounter complete. 

## 2017-11-06 ENCOUNTER — Ambulatory Visit (HOSPITAL_COMMUNITY)
Admission: RE | Admit: 2017-11-06 | Discharge: 2017-11-06 | Disposition: A | Discharge status: Home / Self Care | Payer: Medicare Other | Source: Ambulatory Visit | Attending: Cardiovascular Disease | Admitting: Cardiovascular Disease

## 2017-11-06 DIAGNOSIS — R002 Palpitations: Secondary | ICD-10-CM | POA: Insufficient documentation

## 2017-11-06 LAB — EXERCISE TOLERANCE TEST
Estimated workload: 7.2 METS
Exercise duration (min): 6 min
Exercise duration (sec): 12 s
MPHR: 161 {beats}/min
Peak HR: 151 {beats}/min
Percent HR: 93 %
RPE: 18
Rest HR: 78 {beats}/min

## 2017-11-07 ENCOUNTER — Ambulatory Visit (INDEPENDENT_AMBULATORY_CARE_PROVIDER_SITE_OTHER): Payer: Medicare Other

## 2017-11-07 DIAGNOSIS — R42 Dizziness and giddiness: Secondary | ICD-10-CM

## 2017-11-07 DIAGNOSIS — R002 Palpitations: Secondary | ICD-10-CM

## 2017-11-09 ENCOUNTER — Telehealth: Payer: Self-pay | Admitting: Cardiology

## 2017-11-09 NOTE — Telephone Encounter (Signed)
Pt calling   She is returning call to a nurse concerning results.

## 2017-11-09 NOTE — Telephone Encounter (Signed)
Left message for pt to call.

## 2017-11-09 NOTE — Telephone Encounter (Signed)
Spoke with pt, she is aware of her GXT results. She does not check her bp at home and is unable to purchase a bp cuff. Will make dr hochrein aware.

## 2017-11-12 ENCOUNTER — Telehealth: Payer: Self-pay | Admitting: *Deleted

## 2017-11-12 NOTE — Telephone Encounter (Signed)
Spoke with pt, we received an alert from the monitor she is wearing. On Saturday she had an episode of chest pressure and fluttering in her chest. It only lasted a few seconds and she has felt fine since. Dr croitoru reviewed the strips, No change in medications at this time.

## 2017-11-15 NOTE — Telephone Encounter (Signed)
I think she could just get it check at a pharmacy or such place if she has the chance.  I did not think that the BP was severely elevated with exercise.

## 2017-11-15 NOTE — Telephone Encounter (Signed)
Call pt about Dr Percival Spanish recommendation

## 2017-11-19 ENCOUNTER — Telehealth: Payer: Self-pay | Admitting: *Deleted

## 2017-11-19 NOTE — Telephone Encounter (Signed)
EKG monitor sent in by [employer]. The patient had a 7 beat run of Vtach on 7/14. Patient called and is asymptomatic. She stated that she has an occasional flutter but did not remember this episode. EKG reading has been placed in the provider's basket for review.

## 2017-11-23 ENCOUNTER — Telehealth: Payer: Self-pay | Admitting: Cardiology

## 2017-11-23 ENCOUNTER — Telehealth: Payer: Self-pay | Admitting: Internal Medicine

## 2017-11-23 NOTE — Telephone Encounter (Signed)
Received fax from [place] concerning episode of VT for 6 seconds at 03:48am CT. This was an auto trigger event, patient was home sleeping. On-call cardiologist was notified and contacted patient. Monitor strip given to primary cardiologist Dr. Enrique Sack CMA to review.

## 2017-11-23 NOTE — Telephone Encounter (Signed)
Cardiology Moonlighter Note  Returned page from [employer]. Patient had 6 second run of NSVT this AM. Patient sleeping and asymptomatic at the time. Now in NSR.   Marcie Mowers, MD Cardiology Fellow, PGY-6

## 2017-11-25 NOTE — Telephone Encounter (Signed)
Please call and tell her that she had some of the rhythm issues that she had previously.  We can discuss this at the appt in early August or if she is having increased palpitations we could try to move that appt up.

## 2017-11-26 ENCOUNTER — Telehealth: Payer: Self-pay | Admitting: *Deleted

## 2017-11-26 NOTE — Telephone Encounter (Signed)
-----   Message from Minus Breeding, MD sent at 11/25/2017  9:15 AM EDT -----   ----- Message ----- From: Marcie Mowers, MD Sent: 11/23/2017   5:58 AM To: Minus Breeding, MD

## 2017-11-26 NOTE — Telephone Encounter (Signed)
Pt aware of her result, pt stated she does not have any increasing palpitation and is willing to keep appt for August 8th

## 2017-11-30 ENCOUNTER — Ambulatory Visit: Payer: Medicare Other | Admitting: Cardiology

## 2017-12-12 NOTE — Progress Notes (Signed)
Cardiology Office Note   Date:  12/13/2017   ID:  Henretta, Quist 1955-12-23, MRN [id number]  PCP:  Shirline Frees, MD  Cardiologist:   No primary care provider on file. Referring:  Shirline Frees, MD    Chief Complaint  Patient presents with  . Palpitations     History of Present Illness: [name] is a 62 y.o. female who was referred by Shirline Frees, MD for evaluation of palpitations and chest pain.  She has a history of wide-complex tachycardia that was seen on a monitor in the past but in 2010 an EP study could not identify an inducible rhythm.  She had a cardiac catheterization with normal coronaries at that time..  She did wear a monitor in 2017 which demonstrated NSR and rare PVCs.   I saw her in June.  She had chest pain.    She has had palpitations.  She had a negative POET (Plain Old Exercise Treadmill).  There were PVCs on tele.      She does get palpitations every day.  They are not particularly bothersome to her.  She is not describing presyncope or syncope although they are unnerving.  She cannot bring them on.  They happen at rest.  She does not have any shortness of breath, PND or orthopnea.  She has no presyncope or syncope.  She is active and can hike when she goes on trips with her sisters.   Past Medical History:  Diagnosis Date  . Anxiety   . Hepatitis C   . Hypertension   . Osteoarthritis   . RA (rheumatoid arthritis) ([place])     Past Surgical History:  Procedure Laterality Date  . BACK SURGERY    . ROTATOR CUFF REPAIR    . Spinal Fusion       Current Outpatient Medications  Medication Sig Dispense Refill  . albuterol (PROVENTIL HFA;VENTOLIN HFA) 108 (90 BASE) MCG/ACT inhaler Inhale 1-2 puffs into the lungs every 6 (six) hours as needed for wheezing or shortness of breath.    . ALPRAZolam (XANAX) 1 MG tablet Take 2 mg by mouth at bedtime. Takes 2mg  at bedtime, can take 0.5mg  as needed for anxiety  0  . Cyanocobalamin (B-12 PO) Take 1  tablet by mouth daily.    Marland Kitchen esomeprazole (NEXIUM) 40 MG capsule Take 40 mg by mouth as needed.    Marland Kitchen HUMIRA 40 MG/0.8ML PSKT Take 40 mg by mouth every Sunday.  6  . irbesartan (AVAPRO) 300 MG tablet Take 300 mg by mouth daily.  3  . Ledipasvir-Sofosbuvir 90-400 MG TABS Take 1 tablet by mouth daily.    . Multiple Vitamin (MULTIVITAMIN WITH MINERALS) TABS tablet Take 1 tablet by mouth daily.    Marland Kitchen NAPROXEN SODIUM PO Take 500 mg by mouth 2 (two) times daily as needed.     . Oxycodone HCl 20 MG TABS Take 20 mg by mouth daily.    Marland Kitchen acetaminophen (TYLENOL) 325 MG tablet Take 650 mg by mouth every 6 (six) hours as needed for moderate pain.    . metoprolol succinate (TOPROL-XL) 50 MG 24 hr tablet Take 1 tablet (50 mg total) by mouth daily. Take with or immediately following a meal. 90 tablet 3   No current facility-administered medications for this visit.     Allergies:   Cymbalta [duloxetine hcl]; Cyclobenzaprine hcl; Celexa  [citalopram hydrobromide]; and Opana  [oxymorphone hcl]    ROS:  Please see the history of present illness.  Otherwise, review of systems are positive for none.   All other systems are reviewed and negative.    PHYSICAL EXAM: VS:  BP 118/62   Pulse 80   Ht 5\' 9"  (1.753 m)   Wt 204 lb 12.8 oz (92.9 kg)   BMI 30.24 kg/m  , BMI Body mass index is 30.24 kg/m.  GENERAL:  Well appearing NECK:  No jugular venous distention, waveform within normal limits, carotid upstroke brisk and symmetric, no bruits, no thyromegaly LUNGS:  Clear to auscultation bilaterally CHEST:  Unremarkable HEART:  PMI not displaced or sustained,S1 and S2 within normal limits, no S3, no S4, no clicks, no rubs, no murmurs ABD:  Flat, positive bowel sounds normal in frequency in pitch, no bruits, no rebound, no guarding, no midline pulsatile mass, no hepatomegaly, no splenomegaly EXT:  2 plus pulses throughout, no edema, no cyanosis no clubbing   EKG:  EKG not ordered today.   Recent Labs: 06/16/2017:  BUN 20; Creatinine, Ser 1.18; Hemoglobin 14.8; Platelets 286; Potassium 3.8; Sodium 140    Lipid Panel No results found for: CHOL, TRIG, HDL, CHOLHDL, VLDL, LDLCALC, LDLDIRECT    Wt Readings from Last 3 Encounters:  12/13/17 204 lb 12.8 oz (92.9 kg)  10/18/17 213 lb (96.6 kg)  06/16/17 180 lb (81.6 kg)      Other studies Reviewed: Additional studies/ records that were reviewed today include: None  Review of the above records demonstrates:    ASSESSMENT AND PLAN:  PALPITATIONS:    She did have NSVT.  However, she is had an evaluation in the past.  She otherwise has a structurally normal heart per her previous cath and no evidence of obstructive coronary disease via stress test.  I am to treat her symptomatically.  Since she needs something for blood pressure I will go ahead and start Toprol-XL 50 mg daily stop her Norvasc.  She will let me know if this helps palpitations.  HTN: She can keep an eye on her blood pressure with this med change.  CHEST PAIN:   This is no longer bothersome.  No further testing is indicated.    Current medicines are reviewed at length with the patient today.  The patient does not have concerns regarding medicines.  The following changes have been made:  As above.   Labs/ tests ordered today include:     No orders of the defined types were placed in this encounter.    Disposition:   FU with me in in six months.    Signed, Minus Breeding, MD  12/13/2017 2:38 PM    [facility]

## 2017-12-13 ENCOUNTER — Encounter: Payer: Self-pay | Admitting: Cardiology

## 2017-12-13 ENCOUNTER — Ambulatory Visit (INDEPENDENT_AMBULATORY_CARE_PROVIDER_SITE_OTHER): Payer: Medicare Other | Admitting: Cardiology

## 2017-12-13 VITALS — BP 118/62 | HR 80 | Ht 69.0 in | Wt 204.8 lb

## 2017-12-13 DIAGNOSIS — R002 Palpitations: Secondary | ICD-10-CM

## 2017-12-13 DIAGNOSIS — R079 Chest pain, unspecified: Secondary | ICD-10-CM | POA: Diagnosis not present

## 2017-12-13 DIAGNOSIS — I10 Essential (primary) hypertension: Secondary | ICD-10-CM | POA: Diagnosis not present

## 2017-12-13 MED ORDER — METOPROLOL SUCCINATE ER 50 MG PO TB24: 50.0000 mg | ORAL_TABLET | Freq: Every day | ORAL | 3 refills | Status: DC

## 2017-12-13 NOTE — Patient Instructions (Signed)
MEDICATION CHANGES    STOP TAKING AMLODIPINE ,NOW  START METOPROLOL SUCCINATE 50 MG ONE TABLET DAILY.    CONTINUE TO MONITOR BLOOD PRESSURE READING.  Your physician wants you to follow-up in [place]. You will receive a reminder letter in the mail two months in advance. If you don't receive a letter, please call our office to schedule the follow-up appointment.    If you need a refill on your cardiac medications before your next appointment, please call your pharmacy.

## 2018-07-05 ENCOUNTER — Other Ambulatory Visit: Payer: Self-pay | Admitting: Family Medicine

## 2018-07-05 DIAGNOSIS — Z1231 Encounter for screening mammogram for malignant neoplasm of breast: Secondary | ICD-10-CM

## 2018-08-05 ENCOUNTER — Inpatient Hospital Stay: Admission: RE | Admit: 2018-08-05 | Payer: Medicare Other | Source: Ambulatory Visit

## 2018-08-08 ENCOUNTER — Encounter: Payer: Self-pay | Admitting: Cardiology

## 2018-08-08 ENCOUNTER — Telehealth (INDEPENDENT_AMBULATORY_CARE_PROVIDER_SITE_OTHER): Payer: Medicare Other | Admitting: Cardiology

## 2018-08-08 VITALS — Ht 69.0 in | Wt 190.0 lb

## 2018-08-08 DIAGNOSIS — R079 Chest pain, unspecified: Secondary | ICD-10-CM

## 2018-08-08 DIAGNOSIS — R002 Palpitations: Secondary | ICD-10-CM

## 2018-08-08 DIAGNOSIS — I10 Essential (primary) hypertension: Secondary | ICD-10-CM

## 2018-08-08 MED ORDER — METOPROLOL TARTRATE 25 MG PO TABS: 25.0000 mg | ORAL_TABLET | Freq: Three times a day (TID) | ORAL | 3 refills | Status: DC | PRN

## 2018-08-08 NOTE — Patient Instructions (Addendum)
Medication Instructions:  START- Metoprolol Tartrate 25 mg 1 tablet every 8 hours as needed for palpitations  If you need a refill on your cardiac medications before your next appointment, please call your pharmacy.  Labwork: None Ordered   Testing/Procedures: None Ordered   Follow-Up: You will need a follow up appointment in 6 months.  Please call our office 2 months in advance to schedule this appointment.  You may see Dr Percival Spanish or one of the following Advanced Practice Providers on your designated Care Team:   Rosaria Ferries, PA-C . Jory Sims, DNP, ANP     At [facility], you and your health needs are our priority.  As part of our continuing mission to provide you with exceptional heart care, we have created designated Provider Care Teams.  These Care Teams include your primary Cardiologist (physician) and Advanced Practice Providers (APPs -  Physician Assistants and Nurse Practitioners) who all work together to provide you with the care you need, when you need it.  Thank you for choosing [facility] at [facility]!!

## 2018-08-08 NOTE — Progress Notes (Signed)
Virtual Visit via Video Note    Evaluation Performed:  Follow-up visit  This visit type was conducted due to national recommendations for restrictions regarding the COVID-19 Pandemic (e.g. social distancing).  This format is felt to be most appropriate for this patient at this time.  All issues noted in this document were discussed and addressed.  No physical exam was performed (except for noted visual exam findings with Video Visits).  Please refer to the patient's chart (MyChart message for video visits and phone note for telephone visits) for the patient's consent to telehealth for [facility].  Date:  08/08/2018   ID:  [name], DOB 01/17/1956, MRN [id number]  Patient Location:  Home   Provider location:   [place], [place]  PCP:  Shirline Frees, MD  Cardiologist:  No primary care provider on file.  Electrophysiologist:  None   Chief Complaint:  Palpitations  History of Present Illness:    [name] is a 63 y.o. female who presents via audio/video conferencing for a telehealth visit today.    The patient says that she has been doing relatively well.  She continues to get palpitations.  At the last appointment I started Toprol-XL but she felt like this drove her heart rate down too low.  She stopped taking this.  I had stopped the Norvasc and I restarted the beta-blocker and she restarted back.  She says she still getting some palpitations.  These happen mostly during the day.  They are sporadic.  She is not having any resting chest discomfort, neck or arm discomfort.  She is not having any shortness of breath, PND or orthopnea.  She is not having any presyncope or syncope.  She is not been going out very much because of the virus and also because of allergies.  The patient does not have symptoms concerning for COVID-19 infection (fever, chills, cough, or new shortness of breath).    Prior CV studies:   The following studies were reviewed today:  None  Past  Medical History:  Diagnosis Date  . Anxiety   . Hepatitis C   . Hypertension   . Osteoarthritis   . RA (rheumatoid arthritis) ([place])    Past Surgical History:  Procedure Laterality Date  . BACK SURGERY    . ROTATOR CUFF REPAIR    . Spinal Fusion       Current Meds  Medication Sig  . acetaminophen (TYLENOL) 325 MG tablet Take 650 mg by mouth every 6 (six) hours as needed for moderate pain.  Marland Kitchen albuterol (PROVENTIL HFA;VENTOLIN HFA) 108 (90 BASE) MCG/ACT inhaler Inhale 1-2 puffs into the lungs every 6 (six) hours as needed for wheezing or shortness of breath.  . ALPRAZolam (XANAX) 1 MG tablet Take 2 mg by mouth at bedtime. Takes 2mg  at bedtime, can take 0.5mg  as needed for anxiety  . amLODipine (NORVASC) 10 MG tablet daily.  . cefUROXime (CEFTIN) 500 MG tablet TK 1 T PO Q 12 H FOR 10 DAYS  . Cyanocobalamin (B-12 PO) Take 1 tablet by mouth daily.  . diclofenac sodium (VOLTAREN) 1 % GEL APP AA QID UTD  . esomeprazole (NEXIUM) 40 MG capsule Take 40 mg by mouth as needed.  . Fluticasone-Salmeterol (ADVAIR DISKUS) 250-50 MCG/DOSE AEPB Advair Diskus 250 mcg-50 mcg/dose powder for inhalation  . HUMIRA 40 MG/0.8ML PSKT Take 40 mg by mouth every Sunday.  . irbesartan (AVAPRO) 300 MG tablet Take 300 mg by mouth daily.  . Ledipasvir-Sofosbuvir 90-400 MG TABS Take  1 tablet by mouth daily.  . Multiple Vitamin (MULTIVITAMIN WITH MINERALS) TABS tablet Take 1 tablet by mouth daily.  Marland Kitchen NAPROXEN SODIUM PO Take 500 mg by mouth 2 (two) times daily as needed.   . Oxycodone HCl 20 MG TABS Take 20 mg by mouth daily.  . Promethazine-DM 6.25-15 MG/5ML SOLN as needed.     Allergies:   Cymbalta [duloxetine hcl]; Cyclobenzaprine hcl; Celexa  [citalopram hydrobromide]; and Opana  [oxymorphone hcl]   Social History   Tobacco Use  . Smoking status: Current Every Day Smoker    Packs/day: 0.10    Types: Cigarettes  . Smokeless tobacco: Never Used  Substance Use Topics  . Alcohol use: Yes    Comment:  Occasional   . Drug use: No     Family Hx: The patient's family history includes Atrial fibrillation in her sister; Heart disease in her mother; Hypertension in her mother; Pancreatitis in her father.  ROS:   Please see the history of present illness.    As stated in the HPI and negative for all other systems.  Labs/Other Tests and Data Reviewed:    Recent Labs: No results found for requested labs within last 8760 hours.   Recent Lipid Panel No results found for: CHOL, TRIG, HDL, CHOLHDL, LDLCALC, LDLDIRECT  Wt Readings from Last 3 Encounters:  08/08/18 190 lb (86.2 kg)  12/13/17 204 lb 12.8 oz (92.9 kg)  10/18/17 213 lb (96.6 kg)     Objective:    Vital Signs:  Ht 5\' 9"  (1.753 m)   Wt 190 lb (86.2 kg)   BMI 28.06 kg/m     ASSESSMENT & PLAN:    PALPITATIONS:     She did previously have NSVT on a monitor.  She had a negative  POET (Plain Old Exercise Treadmill) last year.  I do not strongly suspect structural heart disease and her palpitations are relatively infrequent.  She did not tolerate metoprolol succinate so we will change this to tartrate PRN.  She can take 12 and half to 25 mg and we discussed how she would do this.  She is comfortable with this and will let me know if her palpitations increase.    HTN:  She says her blood pressures been well controlled as she restarted the Norvasc.    CHEST PAIN:    She is not having any further chest discomfort.  No change in therapy.     COVID-19 Education: The signs and symptoms of COVID-19 were discussed with the patient and how to seek care for testing (follow up with PCP or arrange E-visit).    Patient Risk:   After full review of this patient's clinical status, I feel that they are at least moderate risk at this time.  Time:   Today, I have spent 16 minutes with the patient with telehealth technology discussing the above and Covid care.     Medication Adjustments/Labs and Tests Ordered: Current medicines are  reviewed at length with the patient today.  Concerns regarding medicines are outlined above.  Tests Ordered: No orders of the defined types were placed in this encounter.  Medication Changes: No orders of the defined types were placed in this encounter.   Disposition:  Follow up in 6 month(s)  Signed, Minus Breeding, MD  08/08/2018 1:32 PM    [facility]

## 2018-10-18 ENCOUNTER — Ambulatory Visit: Payer: Medicare Other

## 2018-12-03 ENCOUNTER — Ambulatory Visit: Payer: Medicare Other

## 2019-01-17 ENCOUNTER — Other Ambulatory Visit: Payer: Self-pay

## 2019-01-17 ENCOUNTER — Ambulatory Visit
Admission: RE | Admit: 2019-01-17 | Discharge: 2019-01-17 | Disposition: A | Discharge status: Home / Self Care | Payer: Medicare Other | Source: Ambulatory Visit | Attending: Family Medicine | Admitting: Family Medicine

## 2019-01-17 DIAGNOSIS — Z1231 Encounter for screening mammogram for malignant neoplasm of breast: Secondary | ICD-10-CM

## 2019-08-20 ENCOUNTER — Encounter (HOSPITAL_COMMUNITY): Payer: Self-pay | Admitting: Emergency Medicine

## 2019-08-20 ENCOUNTER — Emergency Department (HOSPITAL_COMMUNITY): Payer: Medicare Other

## 2019-08-20 ENCOUNTER — Other Ambulatory Visit: Payer: Self-pay

## 2019-08-20 ENCOUNTER — Emergency Department (HOSPITAL_COMMUNITY)
Admission: EM | Admit: 2019-08-20 | Discharge: 2019-08-20 | Disposition: A | Discharge status: Home / Self Care | Payer: Medicare Other | Attending: Emergency Medicine | Admitting: Emergency Medicine

## 2019-08-20 DIAGNOSIS — R0789 Other chest pain: Secondary | ICD-10-CM | POA: Insufficient documentation

## 2019-08-20 DIAGNOSIS — R0602 Shortness of breath: Secondary | ICD-10-CM | POA: Diagnosis not present

## 2019-08-20 DIAGNOSIS — I10 Essential (primary) hypertension: Secondary | ICD-10-CM | POA: Diagnosis not present

## 2019-08-20 DIAGNOSIS — F17210 Nicotine dependence, cigarettes, uncomplicated: Secondary | ICD-10-CM | POA: Insufficient documentation

## 2019-08-20 DIAGNOSIS — M542 Cervicalgia: Secondary | ICD-10-CM | POA: Diagnosis not present

## 2019-08-20 DIAGNOSIS — R002 Palpitations: Secondary | ICD-10-CM | POA: Diagnosis not present

## 2019-08-20 DIAGNOSIS — R079 Chest pain, unspecified: Secondary | ICD-10-CM

## 2019-08-20 DIAGNOSIS — Z79899 Other long term (current) drug therapy: Secondary | ICD-10-CM | POA: Diagnosis not present

## 2019-08-20 LAB — CBC
HCT: 44.6 % (ref 36.0–46.0)
Hemoglobin: 15.1 g/dL — ABNORMAL HIGH (ref 12.0–15.0)
MCH: 32.9 pg (ref 26.0–34.0)
MCHC: 33.9 g/dL (ref 30.0–36.0)
MCV: 97.2 fL (ref 80.0–100.0)
Platelets: 283 10*3/uL (ref 150–400)
RBC: 4.59 MIL/uL (ref 3.87–5.11)
RDW: 12.1 % (ref 11.5–15.5)
WBC: 4.6 10*3/uL (ref 4.0–10.5)
nRBC: 0 % (ref 0.0–0.2)

## 2019-08-20 LAB — BASIC METABOLIC PANEL
Anion gap: 10 (ref 5–15)
BUN: 12 mg/dL (ref 8–23)
CO2: 24 mmol/L (ref 22–32)
Calcium: 9.1 mg/dL (ref 8.9–10.3)
Chloride: 104 mmol/L (ref 98–111)
Creatinine, Ser: 0.77 mg/dL (ref 0.44–1.00)
GFR calc Af Amer: >60 mL/min (ref >60)
GFR calc non Af Amer: >60 mL/min (ref >60)
Glucose, Bld: 126 mg/dL — ABNORMAL HIGH (ref 70–99)
Potassium: 4 mmol/L (ref 3.5–5.1)
Sodium: 138 mmol/L (ref 135–145)

## 2019-08-20 LAB — TROPONIN I (HIGH SENSITIVITY)
Troponin I (High Sensitivity): 4 ng/L (ref <18)
Troponin I (High Sensitivity): 3 ng/L (ref <18)

## 2019-08-20 LAB — D-DIMER, QUANTITATIVE: D-Dimer, Quant: 0.27 ug/mL-FEU (ref 0.00–0.50)

## 2019-08-20 MED ORDER — PREDNISONE 20 MG PO TABS: 40.0000 mg | ORAL_TABLET | Freq: Every day | ORAL | 0 refills | Status: DC

## 2019-08-20 MED ORDER — SODIUM CHLORIDE 0.9% FLUSH: 3.0000 mL | Freq: Once | INTRAVENOUS | Status: DC

## 2019-08-20 NOTE — ED Triage Notes (Signed)
Onset 3 weeks ago developed chest pressure/palpatations radiating left shoulder with shortness of breath worsening overtime.

## 2019-08-20 NOTE — ED Provider Notes (Signed)
[place] EMERGENCY DEPARTMENT Provider Note   CSN: [id number] Arrival date & time: 08/20/19  1332     History Chief Complaint  Patient presents with  . Chest Pain  . Palpitations  . Shortness of Breath    [name] is a 64 y.o. female.  HPI Patient presents with pain in her left shoulder chest and pain going down the arm.  Has had over 3 weeks.  Also will feel palpitations.  Will come and go.  Sometimes last long time.  Symptoms are brief.  Chest pain is not exertional.  It is dull.  No fevers chills.  Does have some mild shortness of breath with it.  No swelling arm.  States the left hand will feel numb.  States also times will be both feet and sometimes the right hand to.  States has had issues in her neck.  States it was very tight and had trouble moving her neck.  States that has improved.  No cancer history.  No fevers.  Not on anticoagulation.  Does have history of rheumatoid arthritis.  No confusion.  No headaches.  She has had previous neck surgery.  States she never followed up.  Also states that she is on chronic pain medicines.  Patient is seeing cardiology for palpitation for.  Had nonsustained V. tach at times.  Is on metoprolol.  25 mg up to 3 times a day as needed.  States she has been taking it every day but not necessarily 3 times a day.    Past Medical History:  Diagnosis Date  . Anxiety   . Hepatitis C   . Hypertension   . Osteoarthritis   . RA (rheumatoid arthritis) [facility])     Patient Active Problem List   Diagnosis Date Noted  . Chest pain 10/18/2017  . Anxiety   . Hepatitis C   . Hypertension   . Osteoarthritis   . RA (rheumatoid arthritis) ([place])   . Palpitations 02/02/2016  . SYNCOPE AND COLLAPSE 10/23/2008  . FIBROIDS, UTERUS 10/13/2008  . ANXIETY DISORDER 10/13/2008  . DEPRESSION 10/13/2008  . HYPERTENSION 10/13/2008  . 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 UNSPECIFIED PAROXYSMAL TACHYCARDIA 10/13/2008  . ASTHMA  10/13/2008  . GERD 10/13/2008  . ENDOMETRIOSIS 10/13/2008  . DEGENERATIVE JOINT DISEASE 10/13/2008  . TACHYCARDIA 10/13/2008    Past Surgical History:  Procedure Laterality Date  . BACK SURGERY    . ROTATOR CUFF REPAIR    . Spinal Fusion       OB History   No obstetric history on file.     Family History  Problem Relation Age of Onset  . Heart disease Mother        Died with MI 38, chest pain 37s  . Hypertension Mother   . Pancreatitis Father   . Atrial fibrillation Sister     Social History   Tobacco Use  . Smoking status: Current Every Day Smoker    Packs/day: 0.10    Types: Cigarettes  . Smokeless tobacco: Never Used  Substance Use Topics  . Alcohol use: Yes    Comment: Occasional   . Drug use: No    Home Medications Prior to Admission medications   Medication Sig Start Date End Date Taking? Authorizing Provider  acetaminophen (TYLENOL) 325 MG tablet Take 650 mg by mouth every 6 (six) hours as needed for moderate pain.   Yes [provider]  albuterol (PROVENTIL HFA;VENTOLIN HFA) 108 (90 BASE) MCG/ACT inhaler Inhale 1-2 puffs  into the lungs every 6 (six) hours as needed for wheezing or shortness of breath.   Yes [provider]  ALPRAZolam Duanne Moron) 1 MG tablet Take 0.5 mg by mouth at bedtime as needed for anxiety.  05/25/14  Yes [provider]  amLODipine (NORVASC) 10 MG tablet Take 10 mg by mouth daily.  07/07/18  Yes [provider]  Cyanocobalamin (B-12 PO) Take 1 tablet by mouth daily.   Yes [provider]  diclofenac Sodium (VOLTAREN) 1 % GEL Apply 1 application topically in the morning, at noon, in the evening, and at bedtime. 08/12/19  Yes [provider]  Fluticasone-Salmeterol (ADVAIR DISKUS) 250-50 MCG/DOSE AEPB Inhale 1 puff into the lungs daily as needed (breathing.).    Yes [provider]  HUMIRA 40 MG/0.8ML PSKT Take 40 mg by mouth every 30 (thirty) days.  07/20/14  Yes [provider]  irbesartan (AVAPRO) 300 MG tablet Take 300 mg by mouth daily. 09/18/17  Yes [provider]  metoprolol tartrate (LOPRESSOR) 25 MG tablet Take 1 tablet (25 mg total) by mouth every 8 (eight) hours as needed. For palpitation 08/08/18 08/20/19 Yes Hochrein, Jeneen Rinks, MD  Multiple Vitamin (MULTIVITAMIN WITH MINERALS) TABS tablet Take 1 tablet by mouth daily.   Yes [provider]  naproxen (NAPROSYN) 500 MG tablet Take 500 mg by mouth 2 (two) times daily as needed for pain. 07/16/19  Yes [provider]  Oxycodone HCl 20 MG TABS Take 20 mg by mouth daily.   Yes [provider]  Turmeric (QC TUMERIC COMPLEX) 500 MG CAPS Take 1 capsule by mouth daily.   Yes [provider]  XTAMPZA ER 9 MG C12A Take 1 capsule by mouth 2 (two) times daily. 07/28/19  Yes [provider]  cefUROXime (CEFTIN) 500 MG tablet Take 500 mg by mouth 2 (two) times daily with a meal.  07/25/18   [provider]  predniSONE (DELTASONE) 20 MG tablet Take 2 tablets (40 mg total) by mouth daily. 08/20/19   Davonna Belling, MD    Allergies    Cymbalta [duloxetine hcl], Cyclobenzaprine hcl, Celexa  [citalopram hydrobromide], Flexeril [cyclobenzaprine], Fluticasone furoate-vilanterol, and Opana  [oxymorphone hcl]  Review of Systems   Review of Systems  Constitutional: Negative for appetite change.  Respiratory: Positive for shortness of breath.   Musculoskeletal: Positive for back pain and neck pain.    Physical Exam Updated Vital Signs BP 138/87 (BP Location: Right Arm)   Pulse 67   Temp 98.1 F (36.7 C) (Oral)   Resp 17   Ht 5\' 9"  (1.753 m)   Wt 83.9 kg   SpO2 98%   BMI 27.32 kg/m   Physical Exam Vitals and nursing note reviewed.  Constitutional:      Appearance: She is well-developed.  HENT:     Head: Atraumatic.  Cardiovascular:     Rate and Rhythm: Normal rate and regular rhythm.     Comments: Rare PACs on monitor. Abdominal:     Tenderness: There is  no abdominal tenderness.  Musculoskeletal:        General: Normal range of motion.     Cervical back: Neck supple.     Comments: Tenderness over lower cervical spine.  No deformity.  Also some tenderness over anterior shoulder.  Skin:    General: Skin is warm.  Neurological:     Mental Status: She is alert.     Comments: Sensation grossly intact over radial median and ulnar distribution.  Although  under radial distribution of left hand states it felt a little strange.  Good grip strength bilaterally.  Able to do thumbs up okay and cross fingers on bilateral hands.     ED Results / Procedures / Treatments   Labs (all labs ordered are listed, but only abnormal results are displayed) Labs Reviewed  BASIC METABOLIC PANEL - Abnormal; Notable for the following components:      Result Value   Glucose, Bld 126 (*)    All other components within normal limits  CBC - Abnormal; Notable for the following components:   Hemoglobin 15.1 (*)    All other components within normal limits  D-DIMER, QUANTITATIVE (NOT AT [facility])  TROPONIN I (HIGH SENSITIVITY)  TROPONIN I (HIGH SENSITIVITY)    EKG EKG Interpretation  Date/Time:  Wednesday August 20 2019 13:30:50 EDT Ventricular Rate:  71 PR Interval:  148 QRS Duration: 88 QT Interval:  382 QTC Calculation: 415 R Axis:   76 Text Interpretation: Normal sinus rhythm Normal ECG No acute changes Confirmed by Davonna Belling [id number]) on 08/20/2019 4:07:01 PM   Radiology DG Chest 2 View  Result Date: 08/20/2019 CLINICAL DATA:  64 year old female with chest pain, shortness of breath, pain radiating to the left shoulder. EXAM: CHEST - 2 VIEW COMPARISON:  Chest radiographs 06/16/2017. FINDINGS: Lung volumes and mediastinal contours remain normal. Visualized tracheal air column is within normal limits. The lungs are stable in clear aside from minimal chronic increased interstitial opacity which is more pronounced at the lung bases. EKG button artifact in  both upper lobes. No pneumothorax or pleural effusion. No acute osseous abnormality identified. Negative visible bowel gas pattern. IMPRESSION: Negative.  No acute cardiopulmonary abnormality. Electronically Signed   By: Genevie Ann M.D.   On: 08/20/2019 14:27    Procedures Procedures (including critical care time)  Medications Ordered in ED Medications  sodium chloride flush (NS) 0.9 % injection 3 mL (has no administration in time range)    ED Course  I have reviewed the triage vital signs and the nursing notes.  Pertinent labs & imaging results that were available during my care of the patient were reviewed by me and considered in my medical decision making (see chart for details).    MDM Rules/Calculators/A&P                     Patient with few different complaints.  Palpitations.  Has had history of same.  Has metoprolol as needed.  Will follow with cardiology. Also chest pain.  Negative D-dimer and negative troponin x2.  EKG reassuring.  Outpatient follow-up. Also with neck pain going to arm and legs.  Will have follow-up with neurosurgery. Patient appears stable for discharge home. Imaging and labs ordered and reviewed.  Final Clinical Impression(s) / ED Diagnoses Final diagnoses:  Nonspecific chest pain  Neck pain  Palpitations    Rx / DC Orders ED Discharge Orders         Ordered    predniSONE (DELTASONE) 20 MG tablet  Daily     08/20/19 1840           Davonna Belling, MD 08/20/19 1843

## 2019-08-20 NOTE — Discharge Instructions (Signed)
Follow-up with your doctor and follow-up with cardiology for your palpitations and follow-up with neurosurgery for the neck pain.

## 2019-10-10 ENCOUNTER — Other Ambulatory Visit: Payer: Self-pay | Admitting: Cardiology

## 2020-01-05 ENCOUNTER — Other Ambulatory Visit: Payer: Self-pay | Admitting: Cardiology

## 2020-01-30 ENCOUNTER — Other Ambulatory Visit: Payer: Self-pay | Admitting: Neurosurgery

## 2020-01-30 ENCOUNTER — Telehealth: Payer: Self-pay | Admitting: Nurse Practitioner

## 2020-01-30 DIAGNOSIS — M5412 Radiculopathy, cervical region: Secondary | ICD-10-CM

## 2020-01-30 NOTE — Telephone Encounter (Signed)
Phone call to patient to verify medication list and allergies for myelogram procedure. Pt aware she will not need to hold any medications for this procedure. Pre and post procedure instructions reviewed with pt. Pt verbalized understanding. 

## 2020-02-06 ENCOUNTER — Ambulatory Visit
Admission: RE | Admit: 2020-02-06 | Discharge: 2020-02-06 | Disposition: A | Discharge status: Home / Self Care | Payer: Medicare Other | Source: Ambulatory Visit | Attending: Neurosurgery | Admitting: Neurosurgery

## 2020-02-06 DIAGNOSIS — M5412 Radiculopathy, cervical region: Secondary | ICD-10-CM

## 2020-02-06 MED ORDER — IOPAMIDOL (ISOVUE-M 300) INJECTION 61%
10.0000 mL | Freq: Once | INTRAMUSCULAR | Status: AC | PRN
  Administered 2020-02-06: 10 mL via INTRATHECAL

## 2020-02-06 NOTE — Discharge Instructions (Signed)

## 2020-02-06 NOTE — Progress Notes (Signed)
Pt reports she took 0.5mg  of Ativan prior to arrival at [place]. Therefore, Valium will not be given prior to her procedure.

## 2020-02-11 ENCOUNTER — Other Ambulatory Visit: Payer: Self-pay | Admitting: Neurosurgery

## 2020-02-11 DIAGNOSIS — M5414 Radiculopathy, thoracic region: Secondary | ICD-10-CM

## 2020-02-24 ENCOUNTER — Other Ambulatory Visit: Payer: Self-pay | Admitting: Family Medicine

## 2020-02-24 DIAGNOSIS — R29898 Other symptoms and signs involving the musculoskeletal system: Secondary | ICD-10-CM

## 2020-02-25 ENCOUNTER — Ambulatory Visit
Admission: RE | Admit: 2020-02-25 | Discharge: 2020-02-25 | Disposition: A | Discharge status: Home / Self Care | Payer: Medicare Other | Source: Ambulatory Visit | Attending: Family Medicine | Admitting: Family Medicine

## 2020-02-25 ENCOUNTER — Other Ambulatory Visit: Payer: Self-pay

## 2020-02-25 DIAGNOSIS — R29898 Other symptoms and signs involving the musculoskeletal system: Secondary | ICD-10-CM

## 2020-03-01 ENCOUNTER — Encounter: Payer: Self-pay | Admitting: Neurology

## 2020-05-06 ENCOUNTER — Ambulatory Visit: Payer: Medicare Other | Admitting: Neurology

## 2020-06-01 NOTE — Progress Notes (Addendum)
[facility]    Provider:  Dr Jaynee Eagles Requesting Provider: Shirline Frees, MD Primary Care Provider:  Shirline Frees, MD  CC:  Leg weakness  HPI:  [name] is a 65 y.o. female here as requested by Shirline Frees, MD for weakness. 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 Osteoarthritis(MRI 2018), asthma, irregular heartbeat, anxiety, hypertension, hypercholesterolemia, hypertensive retinopathy, chronic back pain and degenerative disc disease, chronic pain syndrome and tobacco dependence, rheumatoid arthritis, prediabetes, acute hep C, depression.  I reviewed Dr. Doreene Adas notes: She has allergies to gabapentin and tramadol Cymbalta and citalopram; patient was sent over for imbalance and weakness with an MRI showing small vessel disease versus demyelinating disease, on oxycodone, she presented to Dr. Kenton Kingfisher with bilateral leg weakness, going on for weeks, prior to that she had back spasms, she been taking prednisone and tizanidine and when last seen was felt to possibly have a cervical radiculopathy so the tizanidine and prednisone helped her pain, but her legs are worse, she does have chronic back pain, she is seeing rheumatology and pain management, her last A1c was 6 in October, blood pressure has been managed on metoprolol irbesartan and amlodipine, lipid panel October looked good, I reviewed Dr. Doreene Adas examination which showed normal back, extremities, neurologic exam showed 2+ deep tendon reflexes and symmetrical lower extremities, muscle strength decreased in the right lower extremity with negative straight leg raise.  MRI of the brain images were reviewed:see below, consistent with chronic microvascular ischemic disease(unlikely demyelination), CT 02/06/2020 myelogram cervical/thoracic showed no spinal stenosis in these areas, but MRI lumbar spine showed moderately severe central  canal and bilateral subarticular recess stenosis which could impact either descending L5 root which is likely the cause of her symptoms.  She feels her balance is impaired, she can be walking and she will walk sideways, low back pain all the time, radiculopathy, leg weakness, thighs feel heavy, some days she has to sit down because legs start feeling weak, the right leg is always weak, stabbing pain. Her tail bone hurts, she can only sit for so long, worse when walking long distances or sitting for long periods, has lots of stiffness. Symptoms have been ongoing for years but worsening, slowly progressive. No significant problems with the arms, mostly the legs, she gets cramps in the legs and feet, spasms, progressively worsening, No other focal neurologic deficits, associated symptoms, inciting events or modifiable factors.  Reviewed notes, labs and imaging from outside physicians, which showed:  MRI brain 02/25/2020: IMPRESSION: Scattered white matter changes mostly in the deep white matter with confluent hyperintensity in the periventricular white matter. Findings most likely due to chronic microvascular ischemia. Correlate with risk factors. Demyelinating disease considered less likely  MRI lumbar spine 05/17/2016:  Disc levels:  T11-12 and T12-L1 are imaged in the sagittal plane only and negative.  L1-2:  Negative.  L2-3: Right worse than left facet degenerative disease. No disc bulge or protrusion. The central canal and foramina are open.  L3-4: Moderate facet degenerative change and a shallow disc bulge. The central canal and foramina are open.  L4-5: Moderate facet degenerative change, broad-based right paracentral protrusion and ligamentum flavum thickening cause moderately severe central canal and bilateral subarticular recess stenosis. The foramina are open.  L5-S1: Shallow disc bulge and endplate spur. Mild to moderate facet degenerative change. The central canal is  mildly narrowed. The foramina are open.  IMPRESSION: Broad-based right paracentral protrusion at L4-5 results  in moderately severe central canal and bilateral subarticular recess stenosis which could impact either descending L5 root.  Mild central canal narrowing at L5-S1 where there is a shallow disc bulge with endplate spurring.  Duplicated bilateral renal collecting systems. Fullness of the right intrarenal collecting systems and visualized ureters is noted.   Review of Systems: Patient complains of symptoms per HPI as well as the following symptoms: low back pain, weakness. Pertinent negatives and positives per HPI. All others negative.   Social History   Socioeconomic History  . Marital status: Divorced    Spouse name: Not on file  . Number of children: 1  . Years of education: Not on file  . Highest education level: Not on file  Occupational History  . Not on file  Tobacco Use  . Smoking status: Current Some Day Smoker    Packs/day: 0.10    Types: Cigarettes  . Smokeless tobacco: Never Used  Vaping Use  . Vaping Use: Never used  Substance and Sexual Activity  . Alcohol use: Yes    Comment: Occasional   . Drug use: No  . Sexual activity: Not on file  Other Topics Concern  . Not on file  Social History Narrative   Lives alone   Right handed   Caffeine: none    Social Determinants of Health   Financial Resource Strain: Not on file  Food Insecurity: Not on file  Transportation Needs: Not on file  Physical Activity: Not on file  Stress: Not on file  Social Connections: Not on file  Intimate Partner Violence: Not on file    Family History  Problem Relation Age of Onset  . Heart disease Mother        Died with MI 4, chest pain 38s  . Hypertension Mother   . Pancreatitis Father   . Atrial fibrillation Sister   . Multiple sclerosis Cousin     Past Medical History:  Diagnosis Date  . Anxiety   . Asthma   . Back problem    disc disease,  chronic low back pain, right leg pain, s/p discectomy 99  . Depression   . Diastolic dysfunction    with elevated LVEDP at cath  . Endometriosis   . Fibroids    hx of   . GERD (gastroesophageal reflux disease)   . Hepatitis C   . Hidradenitis suppurativa   . Hypertension   . Osteoarthritis   . Prediabetes   . RA (rheumatoid arthritis) ([place])   . Rotator cuff tear    right shoulder   . Syncope     Patient Active Problem List   Diagnosis Date Noted  . Lumbar stenosis with neurogenic claudication 06/02/2020  . Weakness of both lower extremities 06/02/2020  . Lumbosacral radiculopathy 06/02/2020  . Chest pain 10/18/2017  . Anxiety   . Hepatitis C   . Hypertension   . Osteoarthritis   . RA (rheumatoid arthritis) ([place])   . Palpitations 02/02/2016  . SYNCOPE AND COLLAPSE 10/23/2008  . FIBROIDS, UTERUS 10/13/2008  . ANXIETY DISORDER 10/13/2008  . DEPRESSION 10/13/2008  . HYPERTENSION 10/13/2008  . 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 UNSPECIFIED PAROXYSMAL TACHYCARDIA 10/13/2008  . ASTHMA 10/13/2008  . GERD 10/13/2008  . ENDOMETRIOSIS 10/13/2008  . DEGENERATIVE JOINT DISEASE 10/13/2008  . TACHYCARDIA 10/13/2008    Past Surgical History:  Procedure Laterality Date  . ANGIOPLASTY    . BACK SURGERY    . COLONOSCOPY  07/2010   tubular adenoma  . COLONOSCOPY  08/2015  .  DISKECTOMY    . laparoscopy for endometriosis    . left shoulder scope    . ROTATOR CUFF REPAIR    . Spinal Fusion  12/2016   L4-S1    Current Outpatient Medications  Medication Sig Dispense Refill  . acetaminophen (TYLENOL) 325 MG tablet Take 650 mg by mouth every 6 (six) hours as needed for moderate pain.    Marland Kitchen amLODipine (NORVASC) 10 MG tablet Take 10 mg by mouth daily.     . Cyanocobalamin (B-12 PO) Take 1 tablet by mouth daily.    . diclofenac Sodium (VOLTAREN) 1 % GEL Apply 1 application topically in the morning, at noon, in the evening, and at bedtime.    . Fluticasone-Salmeterol (ADVAIR)  250-50 MCG/DOSE AEPB Inhale 1 puff into the lungs daily as needed (breathing.).     Marland Kitchen hydroxychloroquine (PLAQUENIL) 200 MG tablet Take 200 mg by mouth 2 (two) times daily.    . irbesartan (AVAPRO) 300 MG tablet Take 300 mg by mouth daily.  3  . LORazepam (ATIVAN) 0.5 MG tablet Take 0.5 mg by mouth every 8 (eight) hours.    Marland Kitchen MAGNESIUM PO Take 1 tablet by mouth daily.    . Multiple Vitamin (MULTIVITAMIN WITH MINERALS) TABS tablet Take 1 tablet by mouth daily.    . naproxen (NAPROSYN) 500 MG tablet Take 500 mg by mouth 2 (two) times daily as needed for pain.    . Oxycodone HCl 20 MG TABS Take 20 mg by mouth daily.    Marland Kitchen tiZANidine (ZANAFLEX) 2 MG tablet Take 2 mg by mouth at bedtime as needed for muscle spasms.    . Turmeric 500 MG CAPS Take 1 capsule by mouth daily.     No current facility-administered medications for this visit.    Allergies as of 06/02/2020 - Review Complete 06/02/2020  Allergen Reaction Noted  . Cymbalta [duloxetine hcl] Shortness Of Breath and Swelling 08/12/2014  . Cyclobenzaprine hcl Other (See Comments)   . Celexa  [citalopram hydrobromide]  12/13/2017  . Flexeril [cyclobenzaprine]  08/20/2019  . Fluticasone furoate-vilanterol Swelling 09/09/2015  . Gabapentin  06/02/2020  . Opana  [oxymorphone hcl]  12/13/2017  . Tramadol  06/02/2020    Vitals: BP (!) 154/96 (BP Location: Right Arm, Patient Position: Sitting) Comment: pt in pain. didn't take pain medication yet today  Pulse 68   Ht 5\' 9"  (1.753 m)   Wt 188 lb (85.3 kg)   BMI 27.76 kg/m  Last Weight:  Wt Readings from Last 1 Encounters:  06/02/20 188 lb (85.3 kg)   Last Height:   Ht Readings from Last 1 Encounters:  06/02/20 5\' 9"  (1.753 m)     Physical exam: Exam: Gen: NAD, conversant, well nourised, well groomed                     CV: RRR, no MRG. No Carotid Bruits. No peripheral edema, warm, nontender Eyes: Conjunctivae clear without exudates or hemorrhage  Neuro: Detailed Neurologic  Exam  Speech:    Speech is normal; fluent and spontaneous with normal comprehension.  Cognition:    The patient is oriented to person, place, and time;     recent and remote memory intact;     language fluent;     normal attention, concentration,     fund of knowledge Cranial Nerves:    The pupils are equal, round, and reactive to light. Pupils too small to visualize fundi.. Visual fields are full to finger confrontation. Extraocular movements  are intact. Trigeminal sensation is intact and the muscles of mastication are normal. The face is symmetric. The palate elevates in the midline. Hearing intact. Voice is normal. Shoulder shrug is normal. The tongue has normal motion without fasciculations.   Coordination:    Normal finger to nose, no dysmetria or ataxia  Gait:    Cannot walk on heels or toes, distal weakness, antalgic with cane (low back pain and radicular symptoms)  Motor Observation:    No asymmetry, no atrophy, and no involuntary movements noted. Tone:    Normal muscle tone.    Posture:    Posture is normal. normal erect    Strength: Leg flexion and foot dorsiflexion weakness. Strength is V/V in the upper limbs.      Sensation: intact to LT     Reflex Exam:  DTR's:    Right trace AJ, left absent Aj, otherwise deep tendon reflexes in the upper and lower extremities are normal bilaterally.   Toes:    The toes are downgoing bilaterally.   Clonus:    Clonus is absent.    Assessment/Plan:   65 y.o.lovely female who looks 10 years younger than stated age who is here as requested by Shirline Frees, MD for weakness. 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 Osteoarthritis(MRI 2018), asthma, irregular heartbeat, anxiety, hypertension, hypercholesterolemia, hypertensive retinopathy, chronic back pain and degenerative disc disease, chronic pain syndrome and tobacco dependence, rheumatoid  arthritis, prediabetes, acute hep C, depression.  MRI of the brain images were reviewed:consistent with chronic microvascular ischemic disease(unlikely demyelination), CT 02/06/2020 myelogram cervical/thoracic showed no spinal stenosis in these areas, but  MRI lumbar spine 2018 showed moderately severe central canal and bilateral subarticular recess stenosis which could impact either descending L5 root. This is c/w with her progressive symptoms of lumbar spinal stenosis and claudication. She has weakness in L5 distribution bilaterally distally. I will order MRI lumbar spine to follow up.   Addendum 06/08/2020 Jaynee Eagles: Repeat MRI Lumbar spine shows that there has been interval surgery and the central stenosis at L4/L5 and L5/S1 has been surgically resolved (patient did not tell me that she had surgery). There is L3/L4 moderate spinal stenosis but appears stable as compared to 2018.  I will discuss with patient.  MRI of the brain with age and medical-condition appropriate chronic microvascular ischemia. Discussed managing vascular risk factors. Not demyelinating or severe. Follow clinically.  Orders Placed This Encounter  Procedures  . MR LUMBAR SPINE WO CONTRAST  . Ambulatory referral to Neurosurgery   Cc: Shirline Frees, MD,  Shirline Frees, MD  Sarina Ill, MD  [facility] [address] [place], [place] [address]  Phone [phone] Fax [phone]

## 2020-06-02 ENCOUNTER — Encounter: Payer: Self-pay | Admitting: Neurology

## 2020-06-02 ENCOUNTER — Ambulatory Visit (INDEPENDENT_AMBULATORY_CARE_PROVIDER_SITE_OTHER): Payer: Medicare Other | Admitting: Neurology

## 2020-06-02 ENCOUNTER — Telehealth: Payer: Self-pay | Admitting: *Deleted

## 2020-06-02 ENCOUNTER — Other Ambulatory Visit: Payer: Self-pay

## 2020-06-02 ENCOUNTER — Telehealth: Payer: Self-pay | Admitting: Neurology

## 2020-06-02 VITALS — BP 154/96 | HR 68 | Ht 69.0 in | Wt 188.0 lb

## 2020-06-02 DIAGNOSIS — M48062 Spinal stenosis, lumbar region with neurogenic claudication: Secondary | ICD-10-CM | POA: Diagnosis not present

## 2020-06-02 DIAGNOSIS — R29898 Other symptoms and signs involving the musculoskeletal system: Secondary | ICD-10-CM

## 2020-06-02 DIAGNOSIS — M79604 Pain in right leg: Secondary | ICD-10-CM

## 2020-06-02 DIAGNOSIS — M79605 Pain in left leg: Secondary | ICD-10-CM

## 2020-06-02 DIAGNOSIS — M5417 Radiculopathy, lumbosacral region: Secondary | ICD-10-CM

## 2020-06-02 DIAGNOSIS — M4807 Spinal stenosis, lumbosacral region: Secondary | ICD-10-CM

## 2020-06-02 NOTE — Patient Instructions (Signed)
MRI of the lumbar spine Referral to Dr. Link Snuffer and Linna Darner neurological surgery (7th ed., pp. [phone]). [place], [place]: Elsevier."> Leslie Dales and Winn neurological surgery (7th ed., pp. [phone]). [place], [place]: Elsevier.">  Spinal Stenosis  Spinal stenosis is a condition that happens when the spinal canal narrows. The spinal canal is the space between the bones of your spine (vertebrae). This narrowing puts pressure on the spinal cord or nerves. Spinal stenosis can affect the vertebrae in the neck, upper back, and lower back. This condition can range from mild to severe. In some cases, there are no symptoms. What are the causes? This condition is caused by areas of bone pushing into the spinal canal. This condition may be present at birth (congenital), or it may be caused by:  Slow breakdown of your vertebrae (spinal degeneration). This usually starts around 63 years of age.  Injury (trauma) to your spine.  Tumors in your spine.  Calcium deposits in your spine. What increases the risk? The following factors may make you more likely to develop this condition:  Being older than age 45.  Having a problem present at birth with an abnormally shaped spine (congenitalspinal deformity), such as scoliosis.  Having arthritis. What are the signs or symptoms? Symptoms of this condition include:  Pain in the back that is generally worse with activities, particularly when you stand or walk.  Numbness, tingling, hot or cold sensations, weakness, or tiredness (fatigue) in your leg.  Pain going from the buttock, down the thigh, and to the calf (sciatica). This can happen in one or both legs.  Frequent episodes of falling.  A foot-slapping gait that leads to muscle weakness. In more severe cases, you may develop:  Problems having a bowel movement or urinating.  Difficulty having sex.  Loss of feeling in your legs and inability to walk. Symptoms may come on slowly and get  worse over time. In some cases, there are no symptoms. How is this diagnosed? This condition is diagnosed based on your medical history and a physical exam. You will also have tests, such as an MRI, a CT scan, or an X-ray. How is this treated? Treatment for this condition often focuses on managing your pain and any other symptoms. Treatment may include:  Practicing good posture to lessen pressure on your nerves.  Exercising to strengthen muscles, build endurance, improve balance, and maintain range of motion. This may include physical therapy to restore movement and strength to your back.  Losing weight, if needed.  Medicines to reduce inflammation or pain. This may include a medicine that is injected into your spine (steroidinjection).  Assistive devices, such as a corset or brace. In some cases, surgery may be needed. The most common procedure is decompression laminectomy. This is done to remove excess bone that puts pressure on your nerve roots. Follow these instructions at home: Managing pain, stiffness, and swelling  Practice good posture. If you were given a brace or a corset, wear it as told by your health care provider.  Maintain a healthy weight. Talk with your health care provider if you need help losing weight.  If directed, apply heat to the affected area as often as told by your health care provider. Use the heat source that your health care provider recommends, such as a moist heat pack or a heating pad. ? Place a towel between your skin and the heat source. ? Leave the heat on for 20-30 minutes. ? Remove the heat if your skin turns  bright red. This is especially important if you are unable to feel pain, heat, or cold. You may have a greater risk of getting burned.   Activity  Do all exercises and stretches as told by your health care provider.  Do not do any activities that cause pain. Ask your health care provider what activities are safe for you.  Do not lift  anything that is heavier than 10 lb (4.5 kg), or the limit that you are told by your health care provider.  Return to your normal activities as told by your health care provider. Ask your health care provider what activities are safe for you. General instructions  Take over-the-counter and prescription medicines only as told by your health care provider.  Do not use any products that contain nicotine or tobacco, such as cigarettes, e-cigarettes, and chewing tobacco. If you need help quitting, ask your health care provider.  Eat a healthy diet. This includes plenty of fruits and vegetables, whole grains, and low-fat (lean) protein.  Keep all follow-up visits as told by your health care provider. This is important. Contact a health care provider if:  Your symptoms do not get better or they get worse.  You have a fever. Get help right away if:  You have new pain or symptoms of severe pain, such as: ? New or worsening pain in your neck or upper back. ? Severe pain that cannot be controlled with medicines. ? A severe headache that gets worse when you stand.  You are dizzy.  You have vision problems, such as blurred vision or double vision.  You have nausea or you vomit.  You develop new or worsening numbness or tingling in your back or legs.  You have pain, redness, swelling, or warmth in your arm or leg. Summary  Spinal stenosis is a condition that happens when the spinal canal narrows. The spinal canal is the space between the bones of your spine (vertebrae). This narrowing puts pressure on the spinal cord or nerves.  This condition may be caused by a birth defect, breakdown of your vertebrae, trauma, tumors, or calcium deposits.  Spinal stenosis can cause numbness, weakness, or pain in the buttocks, neck, back, and legs.  This condition is usually diagnosed with your medical history, a physical exam, and tests, such as an MRI, a CT scan, or an X-ray. This information is not  intended to replace advice given to you by your health care provider. Make sure you discuss any questions you have with your health care provider. Document Revised: 02/20/2019 Document Reviewed: 02/20/2019 Elsevier Patient Education  2021 [employer].

## 2020-06-02 NOTE — Telephone Encounter (Signed)
cigna medicare/medicaid order sent to GI. They will obtain the auth for cigna and reach out to the patient to schedule.

## 2020-06-02 NOTE — Telephone Encounter (Signed)
I noticed patient did not sign her DPR form at office visit today, but she did complete it otherwise. I called the pt. She apologized and will come to the office tomorrow to sign it. DPR left at front desk.

## 2020-06-06 ENCOUNTER — Ambulatory Visit
Admission: RE | Admit: 2020-06-06 | Discharge: 2020-06-06 | Disposition: A | Discharge status: Home / Self Care | Payer: Medicare Other | Source: Ambulatory Visit | Attending: Neurology | Admitting: Neurology

## 2020-06-06 DIAGNOSIS — M4807 Spinal stenosis, lumbosacral region: Secondary | ICD-10-CM

## 2020-06-06 DIAGNOSIS — M5417 Radiculopathy, lumbosacral region: Secondary | ICD-10-CM

## 2020-06-06 DIAGNOSIS — R29898 Other symptoms and signs involving the musculoskeletal system: Secondary | ICD-10-CM

## 2020-06-06 DIAGNOSIS — M79604 Pain in right leg: Secondary | ICD-10-CM

## 2020-06-06 DIAGNOSIS — M48062 Spinal stenosis, lumbar region with neurogenic claudication: Secondary | ICD-10-CM

## 2020-06-06 DIAGNOSIS — M79605 Pain in left leg: Secondary | ICD-10-CM

## 2020-06-09 ENCOUNTER — Telehealth: Payer: Self-pay | Admitting: *Deleted

## 2020-06-09 DIAGNOSIS — M48061 Spinal stenosis, lumbar region without neurogenic claudication: Secondary | ICD-10-CM

## 2020-06-09 NOTE — Telephone Encounter (Signed)
-----   Message from Melvenia Beam, MD sent at 06/08/2020  3:09 PM EST ----- Repeat MRI Lumbar spine shows that there has been interval surgery and the central stenosis at L4/L5 and L5/S1 has been surgically resolved (patient did not tell me that she had surgery, would you inquire who did the surgery please?). There is L3/L4 moderate spinal stenosis but appears stable as compared to 2018.  I still think she should have this evaluated but I would like to know who did her prior surgery, she should go back to them. thanks

## 2020-06-09 NOTE — Telephone Encounter (Signed)
Called pt and LVM with office number asking for call back.  

## 2020-06-10 NOTE — Addendum Note (Signed)
Addended by: Gildardo Griffes on: 06/10/2020 04:57 PM   Modules accepted: Orders

## 2020-06-10 NOTE — Telephone Encounter (Addendum)
Spoke with the patient and we discussed the MRI lumbar spine results.  Patient stated that Dr. Rennis Harding performed her surgery in September 2018.  She understands there is moderate spinal stenosis at L3/L4 that should be evaluated although it does appear stable compared to 2018.  She is not comfortable returning to that surgeon and is in agreement to be referred to [place] [facility].  Patient understands a new referral will be sent and she will called by their office to schedule an appointment.  She verbalized appreciation for the call.  Spoke with Dr Jaynee Eagles. Referral order placed- Dr Vertell Limber at [facility].

## 2020-06-23 ENCOUNTER — Other Ambulatory Visit: Payer: Self-pay | Admitting: Family Medicine

## 2020-06-23 DIAGNOSIS — Z1231 Encounter for screening mammogram for malignant neoplasm of breast: Secondary | ICD-10-CM

## 2020-06-26 ENCOUNTER — Other Ambulatory Visit: Payer: Self-pay

## 2020-06-26 ENCOUNTER — Ambulatory Visit
Admission: RE | Admit: 2020-06-26 | Discharge: 2020-06-26 | Disposition: A | Discharge status: Home / Self Care | Payer: Medicaid Other | Source: Ambulatory Visit

## 2020-06-26 DIAGNOSIS — Z1231 Encounter for screening mammogram for malignant neoplasm of breast: Secondary | ICD-10-CM

## 2020-07-08 ENCOUNTER — Other Ambulatory Visit: Payer: Self-pay | Admitting: Psychiatry

## 2020-07-08 DIAGNOSIS — G35 Multiple sclerosis: Secondary | ICD-10-CM

## 2020-07-29 ENCOUNTER — Other Ambulatory Visit: Payer: Medicare Other

## 2020-08-10 DIAGNOSIS — Z79899 Other long term (current) drug therapy: Secondary | ICD-10-CM | POA: Diagnosis not present

## 2020-08-10 DIAGNOSIS — M5136 Other intervertebral disc degeneration, lumbar region: Secondary | ICD-10-CM | POA: Diagnosis not present

## 2020-08-11 ENCOUNTER — Other Ambulatory Visit: Payer: Self-pay

## 2020-08-11 ENCOUNTER — Ambulatory Visit
Admission: RE | Admit: 2020-08-11 | Discharge: 2020-08-11 | Disposition: A | Discharge status: Home / Self Care | Payer: Medicare Other | Source: Ambulatory Visit | Attending: Psychiatry | Admitting: Psychiatry

## 2020-08-11 DIAGNOSIS — G35 Multiple sclerosis: Secondary | ICD-10-CM | POA: Diagnosis not present

## 2020-08-11 DIAGNOSIS — M4802 Spinal stenosis, cervical region: Secondary | ICD-10-CM | POA: Diagnosis not present

## 2020-08-11 DIAGNOSIS — M47812 Spondylosis without myelopathy or radiculopathy, cervical region: Secondary | ICD-10-CM | POA: Diagnosis not present

## 2020-08-11 DIAGNOSIS — M2578 Osteophyte, vertebrae: Secondary | ICD-10-CM | POA: Diagnosis not present

## 2020-08-11 MED ORDER — GADOBENATE DIMEGLUMINE 529 MG/ML IV SOLN
15.0000 mL | Freq: Once | INTRAVENOUS | Status: AC | PRN
  Administered 2020-08-11: 15 mL via INTRAVENOUS

## 2020-09-02 DIAGNOSIS — I10 Essential (primary) hypertension: Secondary | ICD-10-CM | POA: Diagnosis not present

## 2020-09-02 DIAGNOSIS — F17200 Nicotine dependence, unspecified, uncomplicated: Secondary | ICD-10-CM | POA: Diagnosis not present

## 2020-09-02 DIAGNOSIS — R110 Nausea: Secondary | ICD-10-CM | POA: Diagnosis not present

## 2020-09-02 DIAGNOSIS — Q2546 Tortuous aortic arch: Secondary | ICD-10-CM | POA: Diagnosis not present

## 2020-09-02 DIAGNOSIS — R0789 Other chest pain: Secondary | ICD-10-CM | POA: Diagnosis not present

## 2020-09-02 DIAGNOSIS — R079 Chest pain, unspecified: Secondary | ICD-10-CM | POA: Diagnosis not present

## 2020-09-03 DIAGNOSIS — R079 Chest pain, unspecified: Secondary | ICD-10-CM | POA: Diagnosis not present

## 2020-09-07 DIAGNOSIS — Z8619 Personal history of other infectious and parasitic diseases: Secondary | ICD-10-CM | POA: Diagnosis not present

## 2020-09-07 DIAGNOSIS — M545 Low back pain, unspecified: Secondary | ICD-10-CM | POA: Diagnosis not present

## 2020-09-07 DIAGNOSIS — M542 Cervicalgia: Secondary | ICD-10-CM | POA: Diagnosis not present

## 2020-09-07 DIAGNOSIS — Z79899 Other long term (current) drug therapy: Secondary | ICD-10-CM | POA: Diagnosis not present

## 2020-09-07 DIAGNOSIS — G8929 Other chronic pain: Secondary | ICD-10-CM | POA: Diagnosis not present

## 2020-09-07 DIAGNOSIS — G35 Multiple sclerosis: Secondary | ICD-10-CM | POA: Diagnosis not present

## 2020-09-07 DIAGNOSIS — R768 Other specified abnormal immunological findings in serum: Secondary | ICD-10-CM | POA: Diagnosis not present

## 2020-09-08 DIAGNOSIS — M62838 Other muscle spasm: Secondary | ICD-10-CM | POA: Diagnosis not present

## 2020-09-08 DIAGNOSIS — G4700 Insomnia, unspecified: Secondary | ICD-10-CM | POA: Diagnosis not present

## 2020-09-08 DIAGNOSIS — G35 Multiple sclerosis: Secondary | ICD-10-CM | POA: Diagnosis not present

## 2020-09-08 DIAGNOSIS — R519 Headache, unspecified: Secondary | ICD-10-CM | POA: Diagnosis not present

## 2020-09-27 ENCOUNTER — Encounter (HOSPITAL_COMMUNITY): Payer: Medicare Other

## 2020-10-05 DIAGNOSIS — M150 Primary generalized (osteo)arthritis: Secondary | ICD-10-CM | POA: Diagnosis not present

## 2020-10-05 DIAGNOSIS — M0579 Rheumatoid arthritis with rheumatoid factor of multiple sites without organ or systems involvement: Secondary | ICD-10-CM | POA: Diagnosis not present

## 2020-10-05 DIAGNOSIS — Z79899 Other long term (current) drug therapy: Secondary | ICD-10-CM | POA: Diagnosis not present

## 2020-10-05 DIAGNOSIS — M542 Cervicalgia: Secondary | ICD-10-CM | POA: Diagnosis not present

## 2020-10-05 DIAGNOSIS — M2550 Pain in unspecified joint: Secondary | ICD-10-CM | POA: Diagnosis not present

## 2020-10-08 DIAGNOSIS — G35 Multiple sclerosis: Secondary | ICD-10-CM | POA: Diagnosis not present

## 2020-10-08 DIAGNOSIS — M5136 Other intervertebral disc degeneration, lumbar region: Secondary | ICD-10-CM | POA: Diagnosis not present

## 2020-10-08 DIAGNOSIS — Z79899 Other long term (current) drug therapy: Secondary | ICD-10-CM | POA: Diagnosis not present

## 2020-10-11 ENCOUNTER — Encounter (HOSPITAL_COMMUNITY): Payer: Medicare Other

## 2020-10-25 ENCOUNTER — Encounter (HOSPITAL_COMMUNITY): Payer: Medicare Other

## 2020-11-09 DIAGNOSIS — Z79899 Other long term (current) drug therapy: Secondary | ICD-10-CM | POA: Diagnosis not present

## 2020-11-09 DIAGNOSIS — G35 Multiple sclerosis: Secondary | ICD-10-CM | POA: Diagnosis not present

## 2020-11-09 DIAGNOSIS — M5136 Other intervertebral disc degeneration, lumbar region: Secondary | ICD-10-CM | POA: Diagnosis not present

## 2020-12-09 DIAGNOSIS — Z79899 Other long term (current) drug therapy: Secondary | ICD-10-CM | POA: Diagnosis not present

## 2020-12-09 DIAGNOSIS — R260 Ataxic gait: Secondary | ICD-10-CM | POA: Diagnosis not present

## 2020-12-09 DIAGNOSIS — M5136 Other intervertebral disc degeneration, lumbar region: Secondary | ICD-10-CM | POA: Diagnosis not present

## 2020-12-09 DIAGNOSIS — G35 Multiple sclerosis: Secondary | ICD-10-CM | POA: Diagnosis not present

## 2020-12-09 DIAGNOSIS — M25512 Pain in left shoulder: Secondary | ICD-10-CM | POA: Diagnosis not present

## 2020-12-20 ENCOUNTER — Other Ambulatory Visit: Payer: Self-pay | Admitting: Obstetrics and Gynecology

## 2021-01-07 DIAGNOSIS — G35 Multiple sclerosis: Secondary | ICD-10-CM | POA: Diagnosis not present

## 2021-01-07 DIAGNOSIS — M5136 Other intervertebral disc degeneration, lumbar region: Secondary | ICD-10-CM | POA: Diagnosis not present

## 2021-01-07 DIAGNOSIS — Z79899 Other long term (current) drug therapy: Secondary | ICD-10-CM | POA: Diagnosis not present

## 2021-01-11 DIAGNOSIS — Z79899 Other long term (current) drug therapy: Secondary | ICD-10-CM | POA: Diagnosis not present

## 2021-01-12 DIAGNOSIS — H903 Sensorineural hearing loss, bilateral: Secondary | ICD-10-CM | POA: Diagnosis not present

## 2021-01-12 DIAGNOSIS — M069 Rheumatoid arthritis, unspecified: Secondary | ICD-10-CM | POA: Diagnosis not present

## 2021-01-12 DIAGNOSIS — E7800 Pure hypercholesterolemia, unspecified: Secondary | ICD-10-CM | POA: Diagnosis not present

## 2021-01-12 DIAGNOSIS — R7303 Prediabetes: Secondary | ICD-10-CM | POA: Diagnosis not present

## 2021-01-12 DIAGNOSIS — G35 Multiple sclerosis: Secondary | ICD-10-CM | POA: Diagnosis not present

## 2021-01-12 DIAGNOSIS — G894 Chronic pain syndrome: Secondary | ICD-10-CM | POA: Diagnosis not present

## 2021-01-12 DIAGNOSIS — F17200 Nicotine dependence, unspecified, uncomplicated: Secondary | ICD-10-CM | POA: Diagnosis not present

## 2021-01-12 DIAGNOSIS — K219 Gastro-esophageal reflux disease without esophagitis: Secondary | ICD-10-CM | POA: Diagnosis not present

## 2021-01-12 DIAGNOSIS — J4520 Mild intermittent asthma, uncomplicated: Secondary | ICD-10-CM | POA: Diagnosis not present

## 2021-01-12 DIAGNOSIS — I10 Essential (primary) hypertension: Secondary | ICD-10-CM | POA: Diagnosis not present

## 2021-01-12 DIAGNOSIS — R7309 Other abnormal glucose: Secondary | ICD-10-CM | POA: Diagnosis not present

## 2021-01-27 DIAGNOSIS — H903 Sensorineural hearing loss, bilateral: Secondary | ICD-10-CM | POA: Diagnosis not present

## 2021-02-08 DIAGNOSIS — Z79899 Other long term (current) drug therapy: Secondary | ICD-10-CM | POA: Diagnosis not present

## 2021-02-08 DIAGNOSIS — G35 Multiple sclerosis: Secondary | ICD-10-CM | POA: Diagnosis not present

## 2021-02-08 DIAGNOSIS — M069 Rheumatoid arthritis, unspecified: Secondary | ICD-10-CM | POA: Diagnosis not present

## 2021-02-08 DIAGNOSIS — M5136 Other intervertebral disc degeneration, lumbar region: Secondary | ICD-10-CM | POA: Diagnosis not present

## 2021-03-01 DIAGNOSIS — J40 Bronchitis, not specified as acute or chronic: Secondary | ICD-10-CM | POA: Diagnosis not present

## 2021-03-03 DIAGNOSIS — Z86010 Personal history of colonic polyps: Secondary | ICD-10-CM | POA: Diagnosis not present

## 2021-03-03 DIAGNOSIS — K5730 Diverticulosis of large intestine without perforation or abscess without bleeding: Secondary | ICD-10-CM | POA: Diagnosis not present

## 2021-03-09 DIAGNOSIS — M5136 Other intervertebral disc degeneration, lumbar region: Secondary | ICD-10-CM | POA: Diagnosis not present

## 2021-03-09 DIAGNOSIS — R030 Elevated blood-pressure reading, without diagnosis of hypertension: Secondary | ICD-10-CM | POA: Diagnosis not present

## 2021-03-09 DIAGNOSIS — G35 Multiple sclerosis: Secondary | ICD-10-CM | POA: Diagnosis not present

## 2021-03-09 DIAGNOSIS — M069 Rheumatoid arthritis, unspecified: Secondary | ICD-10-CM | POA: Diagnosis not present

## 2021-03-09 DIAGNOSIS — I10 Essential (primary) hypertension: Secondary | ICD-10-CM | POA: Diagnosis not present

## 2021-03-09 DIAGNOSIS — Z79899 Other long term (current) drug therapy: Secondary | ICD-10-CM | POA: Diagnosis not present

## 2021-03-15 DIAGNOSIS — L989 Disorder of the skin and subcutaneous tissue, unspecified: Secondary | ICD-10-CM | POA: Diagnosis not present

## 2021-03-15 DIAGNOSIS — J4520 Mild intermittent asthma, uncomplicated: Secondary | ICD-10-CM | POA: Diagnosis not present

## 2021-03-15 DIAGNOSIS — F17200 Nicotine dependence, unspecified, uncomplicated: Secondary | ICD-10-CM | POA: Diagnosis not present

## 2021-03-15 DIAGNOSIS — I10 Essential (primary) hypertension: Secondary | ICD-10-CM | POA: Diagnosis not present

## 2021-03-15 DIAGNOSIS — Z23 Encounter for immunization: Secondary | ICD-10-CM | POA: Diagnosis not present

## 2021-03-15 DIAGNOSIS — M069 Rheumatoid arthritis, unspecified: Secondary | ICD-10-CM | POA: Diagnosis not present

## 2021-03-15 DIAGNOSIS — R7303 Prediabetes: Secondary | ICD-10-CM | POA: Diagnosis not present

## 2021-03-15 DIAGNOSIS — M549 Dorsalgia, unspecified: Secondary | ICD-10-CM | POA: Diagnosis not present

## 2021-03-15 DIAGNOSIS — E7800 Pure hypercholesterolemia, unspecified: Secondary | ICD-10-CM | POA: Diagnosis not present

## 2021-03-15 DIAGNOSIS — Z0000 Encounter for general adult medical examination without abnormal findings: Secondary | ICD-10-CM | POA: Diagnosis not present

## 2021-03-15 DIAGNOSIS — K219 Gastro-esophageal reflux disease without esophagitis: Secondary | ICD-10-CM | POA: Diagnosis not present

## 2021-03-24 DIAGNOSIS — G4700 Insomnia, unspecified: Secondary | ICD-10-CM | POA: Diagnosis not present

## 2021-03-24 DIAGNOSIS — R519 Headache, unspecified: Secondary | ICD-10-CM | POA: Diagnosis not present

## 2021-03-24 DIAGNOSIS — M62838 Other muscle spasm: Secondary | ICD-10-CM | POA: Diagnosis not present

## 2021-03-24 DIAGNOSIS — G35 Multiple sclerosis: Secondary | ICD-10-CM | POA: Diagnosis not present

## 2021-03-25 DIAGNOSIS — M0579 Rheumatoid arthritis with rheumatoid factor of multiple sites without organ or systems involvement: Secondary | ICD-10-CM | POA: Diagnosis not present

## 2021-03-25 DIAGNOSIS — G35 Multiple sclerosis: Secondary | ICD-10-CM | POA: Diagnosis not present

## 2021-03-25 DIAGNOSIS — Z79899 Other long term (current) drug therapy: Secondary | ICD-10-CM | POA: Diagnosis not present

## 2021-03-25 DIAGNOSIS — M2550 Pain in unspecified joint: Secondary | ICD-10-CM | POA: Diagnosis not present

## 2021-03-25 DIAGNOSIS — M150 Primary generalized (osteo)arthritis: Secondary | ICD-10-CM | POA: Diagnosis not present

## 2021-04-07 ENCOUNTER — Other Ambulatory Visit: Payer: Self-pay

## 2021-04-07 ENCOUNTER — Non-Acute Institutional Stay (HOSPITAL_COMMUNITY)
Admission: RE | Admit: 2021-04-07 | Discharge: 2021-04-07 | Disposition: A | Discharge status: Home / Self Care | Payer: Medicare Other | Source: Ambulatory Visit | Attending: Internal Medicine | Admitting: Internal Medicine

## 2021-04-07 DIAGNOSIS — G35 Multiple sclerosis: Secondary | ICD-10-CM | POA: Diagnosis not present

## 2021-04-07 MED ORDER — DIPHENHYDRAMINE HCL 50 MG/ML IJ SOLN
25.0000 mg | Freq: Once | INTRAMUSCULAR | Status: AC
  Administered 2021-04-07: 25 mg via INTRAVENOUS

## 2021-04-07 MED ORDER — DIPHENHYDRAMINE HCL 50 MG/ML IJ SOLN
25.0000 mg | Freq: Once | INTRAMUSCULAR | Status: AC
  Administered 2021-04-07: 25 mg via INTRAVENOUS
  Filled 2021-04-07: qty 1

## 2021-04-07 MED ORDER — SODIUM CHLORIDE 0.9 % IV SOLN: INTRAVENOUS | Status: DC | PRN

## 2021-04-07 MED ORDER — SODIUM CHLORIDE 0.9 % IV SOLN
300.0000 mg | Freq: Once | INTRAVENOUS | Status: AC
  Administered 2021-04-07: 300 mg via INTRAVENOUS
  Filled 2021-04-07: qty 10

## 2021-04-07 MED ORDER — ACETAMINOPHEN 325 MG PO TABS
650.0000 mg | ORAL_TABLET | Freq: Once | ORAL | Status: AC
  Administered 2021-04-07: 650 mg via ORAL
  Filled 2021-04-07: qty 2

## 2021-04-07 MED ORDER — METHYLPREDNISOLONE SODIUM SUCC 125 MG IJ SOLR
125.0000 mg | Freq: Once | INTRAMUSCULAR | Status: AC
  Administered 2021-04-07: 125 mg via INTRAVENOUS
  Filled 2021-04-07: qty 2

## 2021-04-07 NOTE — Progress Notes (Signed)
PATIENT CARE CENTER NOTE   Diagnosis: Multiple Sclerosis G35   Provider: Effie Shy, MD   Procedure: Ocervus 300 mg    Note:  Patient received Ocervus 300 mg infusion (dose 1 of 2). Pre-medications given per order (Solumedrol 125 mg IV, Benadryl 25 mg IV and Tylenol 650 mg).  Half-way through infusion (infusion rate at 120 ml/hr), patient reported back, face and abdominal itching. Patient also reported that the roof of her mouth "felt funny" and her throat was itchy.  Upon assessment, patient noted to have hives on back, abdomen and face. Ocrevus infusion paused and patient given prn dose of Benadryl 25 mg IV per order. After Benadryl, patient reported that the "funny" feeling in roof of mouth resolved and itching decreased but did not fully resolve.  Ocrevus infusion restarted at one-half the previous rate (60 ml/hr). Patient tolerated infusion and infusion rate increased per protocol.  [facility] and notified Serena Colonel, RN of patient's reaction. Serena Colonel will notify Dr. Jacqulynn Cadet to advise if there will be any changes to future Ocrevus infusion order. Observed patient for 45 minutes post infusion (patient unable to wait full hour). Patient continued to report some itching on back and abdomen but no additional symptoms. Vital signs stable. Discharge instructions given. Patient advised to call 911 or report to the ED if symptoms worsen. Patient also advised to call Neurologist's office on Monday to follow-up with Serena Colonel, RN. Patient to come back in 15 days for next infusion. Patient alert, oriented and ambulatory with cane at discharge.

## 2021-04-08 DIAGNOSIS — Z0000 Encounter for general adult medical examination without abnormal findings: Secondary | ICD-10-CM | POA: Diagnosis not present

## 2021-04-08 DIAGNOSIS — F17210 Nicotine dependence, cigarettes, uncomplicated: Secondary | ICD-10-CM | POA: Diagnosis not present

## 2021-04-08 DIAGNOSIS — M5136 Other intervertebral disc degeneration, lumbar region: Secondary | ICD-10-CM | POA: Diagnosis not present

## 2021-04-08 DIAGNOSIS — Z79899 Other long term (current) drug therapy: Secondary | ICD-10-CM | POA: Diagnosis not present

## 2021-04-18 DIAGNOSIS — B349 Viral infection, unspecified: Secondary | ICD-10-CM | POA: Diagnosis not present

## 2021-04-18 DIAGNOSIS — R509 Fever, unspecified: Secondary | ICD-10-CM | POA: Diagnosis not present

## 2021-04-18 DIAGNOSIS — Z20822 Contact with and (suspected) exposure to covid-19: Secondary | ICD-10-CM | POA: Diagnosis not present

## 2021-04-18 DIAGNOSIS — R051 Acute cough: Secondary | ICD-10-CM | POA: Diagnosis not present

## 2021-04-21 ENCOUNTER — Encounter (HOSPITAL_COMMUNITY): Payer: Medicare Other

## 2021-04-26 ENCOUNTER — Non-Acute Institutional Stay (HOSPITAL_COMMUNITY)
Admission: RE | Admit: 2021-04-26 | Discharge: 2021-04-26 | Disposition: A | Discharge status: Home / Self Care | Payer: Medicare Other | Source: Ambulatory Visit | Attending: Internal Medicine | Admitting: Internal Medicine

## 2021-04-26 ENCOUNTER — Other Ambulatory Visit: Payer: Self-pay

## 2021-04-26 DIAGNOSIS — G35 Multiple sclerosis: Secondary | ICD-10-CM | POA: Insufficient documentation

## 2021-04-26 MED ORDER — DIPHENHYDRAMINE HCL 50 MG/ML IJ SOLN
50.0000 mg | Freq: Once | INTRAMUSCULAR | Status: AC
  Administered 2021-04-26: 11:00:00 50 mg via INTRAVENOUS
  Filled 2021-04-26: qty 1

## 2021-04-26 MED ORDER — METHYLPREDNISOLONE SODIUM SUCC 125 MG IJ SOLR
125.0000 mg | Freq: Once | INTRAMUSCULAR | Status: AC
  Administered 2021-04-26: 11:00:00 125 mg via INTRAVENOUS
  Filled 2021-04-26: qty 2

## 2021-04-26 MED ORDER — ACETAMINOPHEN 325 MG PO TABS
650.0000 mg | ORAL_TABLET | Freq: Once | ORAL | Status: AC
  Administered 2021-04-26: 11:00:00 650 mg via ORAL
  Filled 2021-04-26: qty 2

## 2021-04-26 MED ORDER — SODIUM CHLORIDE 0.9 % IV SOLN
300.0000 mg | Freq: Once | INTRAVENOUS | Status: AC
  Administered 2021-04-26: 12:00:00 300 mg via INTRAVENOUS
  Filled 2021-04-26: qty 10

## 2021-04-26 MED ORDER — SODIUM CHLORIDE 0.9 % IV SOLN: INTRAVENOUS | Status: DC | PRN

## 2021-04-26 NOTE — Progress Notes (Signed)
PATIENT CARE CENTER NOTE     Diagnosis: Multiple Sclerosis G35     Provider: Effie Shy, MD     Procedure: Ocrevus 300 mg      Note:  Patient received Ocrevus 300 mg infusion (dose 2 of 2). Patient pre-medicated with IV Solumedrol, Tylenol and IV Benadryl. Due to reaction to previous infusion,  Benadryl increased from 25 to 50 mg and infusion rate started at 15 ml/hr.  Infusion titrated up every 30 minutes to a max infusion rate of 120 ml/hr per order. Patient tolerated infusion well with no adverse reaction. Patient declined to wait for 1 hour post-infusion. Patient to come back in 6 months for next infusion. Alert, oriented and ambulatory at discharge.

## 2021-04-27 ENCOUNTER — Ambulatory Visit (INDEPENDENT_AMBULATORY_CARE_PROVIDER_SITE_OTHER): Payer: Medicare Other

## 2021-04-27 ENCOUNTER — Other Ambulatory Visit: Payer: Self-pay

## 2021-04-27 ENCOUNTER — Encounter (HOSPITAL_COMMUNITY): Payer: Self-pay

## 2021-04-27 ENCOUNTER — Ambulatory Visit (HOSPITAL_COMMUNITY)
Admission: EM | Admit: 2021-04-27 | Discharge: 2021-04-27 | Disposition: A | Discharge status: Home / Self Care | Payer: Medicare Other | Attending: Family Medicine | Admitting: Family Medicine

## 2021-04-27 DIAGNOSIS — J208 Acute bronchitis due to other specified organisms: Secondary | ICD-10-CM | POA: Diagnosis not present

## 2021-04-27 DIAGNOSIS — R0602 Shortness of breath: Secondary | ICD-10-CM | POA: Diagnosis not present

## 2021-04-27 DIAGNOSIS — U071 COVID-19: Secondary | ICD-10-CM | POA: Diagnosis not present

## 2021-04-27 DIAGNOSIS — R059 Cough, unspecified: Secondary | ICD-10-CM | POA: Diagnosis not present

## 2021-04-27 MED ORDER — PREDNISONE 20 MG PO TABS: 20.0000 mg | ORAL_TABLET | Freq: Every day | ORAL | 0 refills | Status: AC

## 2021-04-27 MED ORDER — PROMETHAZINE-DM 6.25-15 MG/5ML PO SYRP: 5.0000 mL | ORAL_SOLUTION | Freq: Four times a day (QID) | ORAL | 0 refills | Status: DC | PRN

## 2021-04-27 NOTE — ED Provider Notes (Signed)
[place]    CSN: [id number] Arrival date & time: 04/27/21  1819      History   Chief Complaint Chief Complaint  Patient presents with   Chest Pain   Fever   Shortness of Breath   Covid Positive    HPI [name] is a 65 y.o. female.   HPI Patient present for evaluation of fever, chest tightness, and shortness of breath which developed today.  Patient is status post COVID-19 viral infection which she tested positive for on 1215.  Patient completed a course of Paxlovid bid taper yesterday.  She also suffers from [place] and had an infusion yesterday and reports upon awakening this morning she had a subjective fever, chest tightness and shortness of breath.  Patient has a history of asthma and recurrent bronchitis.  She reports following her last dose of Paxlovid  that she felt all of her COVID symptoms had improved.  She reports her provider did advise her of the rebound effect of antivirals.  She is here for evaluation.  Past Medical History:  Diagnosis Date   Anxiety    Asthma    Back problem    disc disease, chronic low back pain, right leg pain, s/p discectomy 99   Depression    Diastolic dysfunction    with elevated LVEDP at cath   Endometriosis    Fibroids    hx of    GERD (gastroesophageal reflux disease)    Hepatitis C    Hidradenitis suppurativa    Hypertension    Osteoarthritis    Prediabetes    RA (rheumatoid arthritis) ([place])    Rotator cuff tear    right shoulder    Syncope     Patient Active Problem List   Diagnosis Date Noted   Lumbar stenosis with neurogenic claudication 06/02/2020   Weakness of both lower extremities 06/02/2020   Lumbosacral radiculopathy 06/02/2020   Chest pain 10/18/2017   Anxiety    Hepatitis C    Hypertension    Osteoarthritis    RA (rheumatoid arthritis) ([place])    Palpitations 02/02/2016   SYNCOPE AND COLLAPSE 10/23/2008   FIBROIDS, UTERUS 10/13/2008   ANXIETY DISORDER 10/13/2008   DEPRESSION 10/13/2008    HYPERTENSION 10/13/2008   VENTRICULAR TACHYCARDIA 10/13/2008   UNSPECIFIED PAROXYSMAL TACHYCARDIA 10/13/2008   ASTHMA 10/13/2008   GERD 10/13/2008   ENDOMETRIOSIS 10/13/2008   DEGENERATIVE JOINT DISEASE 10/13/2008   TACHYCARDIA 10/13/2008    Past Surgical History:  Procedure Laterality Date   ANGIOPLASTY     BACK SURGERY     COLONOSCOPY  07/2010   tubular adenoma   COLONOSCOPY  08/2015   DISKECTOMY     laparoscopy for endometriosis     left shoulder scope     ROTATOR CUFF REPAIR     Spinal Fusion  12/2016   L4-S1    OB History   No obstetric history on file.      Home Medications    Prior to Admission medications   Medication Sig Start Date End Date Taking? Authorizing Provider  predniSONE (DELTASONE) 20 MG tablet Take 1 tablet (20 mg total) by mouth daily with breakfast for 5 days. 04/27/21 05/02/21 Yes Scot Jun, FNP  promethazine-dextromethorphan (PROMETHAZINE-DM) 6.25-15 MG/5ML syrup Take 5 mLs by mouth 4 (four) times daily as needed for cough. 04/27/21  Yes Scot Jun, FNP  acetaminophen (TYLENOL) 325 MG tablet Take 650 mg by mouth every 6 (six) hours as needed for moderate pain.  [provider]  amLODipine (NORVASC) 10 MG tablet Take 10 mg by mouth daily.  07/07/18   [provider]  Cyanocobalamin (B-12 PO) Take 1 tablet by mouth daily.    [provider]  diclofenac Sodium (VOLTAREN) 1 % GEL Apply 1 application topically in the morning, at noon, in the evening, and at bedtime. 08/12/19   [provider]  Fluticasone-Salmeterol (ADVAIR) 250-50 MCG/DOSE AEPB Inhale 1 puff into the lungs daily as needed (breathing.).     [provider]  hydroxychloroquine (PLAQUENIL) 200 MG tablet Take 200 mg by mouth 2 (two) times daily. 05/11/20   [provider]  irbesartan (AVAPRO) 300 MG tablet Take 300 mg by mouth daily. 09/18/17   [provider]  LORazepam (ATIVAN) 0.5 MG tablet Take 0.5 mg by  mouth every 8 (eight) hours.    [provider]  MAGNESIUM PO Take 1 tablet by mouth daily.    [provider]  Multiple Vitamin (MULTIVITAMIN WITH MINERALS) TABS tablet Take 1 tablet by mouth daily.    [provider]  naproxen (NAPROSYN) 500 MG tablet Take 500 mg by mouth 2 (two) times daily as needed for pain. 07/16/19   [provider]  Oxycodone HCl 20 MG TABS Take 20 mg by mouth daily.    [provider]  tiZANidine (ZANAFLEX) 2 MG tablet Take 2 mg by mouth at bedtime as needed for muscle spasms.    [provider]  Turmeric 500 MG CAPS Take 1 capsule by mouth daily.    [provider]    Family History Family History  Problem Relation Age of Onset   Heart disease Mother        Died with MI 88, chest pain 35s   Hypertension Mother    Pancreatitis Father    Atrial fibrillation Sister    Multiple sclerosis Cousin     Social History Social History   Tobacco Use   Smoking status: Some Days    Packs/day: 0.10    Types: Cigarettes   Smokeless tobacco: Never  Vaping Use   Vaping Use: Never used  Substance Use Topics   Alcohol use: Yes    Comment: Occasional    Drug use: No     Allergies   Cymbalta [duloxetine hcl], Cyclobenzaprine hcl, Celexa  [citalopram hydrobromide], Flexeril [cyclobenzaprine], Fluticasone furoate-vilanterol, Gabapentin, Opana  [oxymorphone hcl], and Tramadol   Review of Systems Review of Systems Pertinent negatives listed in HPI  Physical Exam Triage Vital Signs ED Triage Vitals  Enc Vitals Group     BP 04/27/21 1923 (!) 161/91     Pulse Rate 04/27/21 1825 99     Resp 04/27/21 1825 20     Temp 04/27/21 1923 98.8 F (37.1 C)     Temp Source 04/27/21 1923 Oral     SpO2 04/27/21 1825 97 %     Weight --      Height --      Head Circumference --      Peak Flow --      Pain Score 04/27/21 1922 3     Pain Loc --      Pain Edu? --      Excl. in [place]? --    No data found.  Updated  Vital Signs BP (!) 161/91 (BP Location: Right Arm)    Pulse 83    Temp 98.8 F (37.1 C) (Oral)    Resp 17    SpO2 94%   Visual  Acuity Right Eye Distance:   Left Eye Distance:   Bilateral Distance:    Right Eye Near:   Left Eye Near:    Bilateral Near:     Physical Exam Constitutional:      Appearance: She is ill-appearing.  HENT:     Head: Normocephalic.  Eyes:     Extraocular Movements: Extraocular movements intact.     Pupils: Pupils are equal, round, and reactive to light.  Pulmonary:     Breath sounds: Normal breath sounds. Decreased air movement present. No wheezing or rhonchi.  Musculoskeletal:     Cervical back: Normal range of motion and neck supple.  Lymphadenopathy:     Cervical: No cervical adenopathy.  Neurological:     Mental Status: She is alert.  Psychiatric:        Attention and Perception: Attention normal.        Mood and Affect: Mood normal.        Speech: Speech normal.        Behavior: Behavior normal.     UC Treatments / Results  Labs (all labs ordered are listed, but only abnormal results are displayed) Labs Reviewed - No data to display  EKG   Radiology DG Chest 2 View  Result Date: 04/27/2021 CLINICAL DATA:  Shortness of breath, cough. EXAM: CHEST - 2 VIEW COMPARISON:  08/20/2019. FINDINGS: The heart size and mediastinal contours are within normal limits. Mild hyperinflation of the lungs is noted. No consolidation, effusion, or pneumothorax. Mild degenerative changes are present in the thoracic spine. IMPRESSION: No active cardiopulmonary disease. Electronically Signed   By: Brett Fairy M.D.   On: 04/27/2021 20:23    Procedures Procedures (including critical care time)  Medications Ordered in UC Medications - No data to display  Initial Impression / Assessment and Plan / UC Course  I have reviewed the triage vital signs and the nursing notes.  Pertinent labs & imaging results that were available during my care of the patient were  reviewed by me and considered in my medical decision making (see chart for details).    Acute bronchitis due COVID-19 virus  Chest x-ray is negative  Treating with a low-dose of prednisone 20 mg once daily for 5 days. Promethazine DM.  Patient will continue her chronic inhaler and continue use of her rescue inhaler as needed.   Strict return precautions given if symptoms worsen and do not readily improve. Final Clinical Impressions(s) / UC Diagnoses   Final diagnoses:  Acute bronchitis due to COVID-19 virus     Discharge Instructions      Your x-ray is negative for pneumonia.  Treating you for COVID related acute bronchitis.  Start prednisone 20 mg once daily for 5 days.  Have also prescribed Promethazine DM for cough.  Continue use of your rescue inhaler and your chronic inhaler     ED Prescriptions     Medication Sig Dispense Auth. Provider   predniSONE (DELTASONE) 20 MG tablet Take 1 tablet (20 mg total) by mouth daily with breakfast for 5 days. 5 tablet Scot Jun, FNP   promethazine-dextromethorphan (PROMETHAZINE-DM) 6.25-15 MG/5ML syrup Take 5 mLs by mouth 4 (four) times daily as needed for cough. 180 mL Scot Jun, FNP      PDMP not reviewed this encounter.   Scot Jun, FNP 04/27/21 2048

## 2021-04-27 NOTE — Discharge Instructions (Signed)
Your x-ray is negative for pneumonia.  Treating you for COVID related acute bronchitis.  Start prednisone 20 mg once daily for 5 days.  Have also prescribed Promethazine DM for cough.  Continue use of your rescue inhaler and your chronic inhaler

## 2021-04-27 NOTE — ED Triage Notes (Signed)
Pt reports being COVID positive and taking medicine.   States this morning she woke up from SOB and back pain.   States she had an infusion yesterday. Pt states she has chest pain and states she has had fever and headaches.

## 2021-05-10 DIAGNOSIS — E559 Vitamin D deficiency, unspecified: Secondary | ICD-10-CM | POA: Diagnosis not present

## 2021-05-10 DIAGNOSIS — M129 Arthropathy, unspecified: Secondary | ICD-10-CM | POA: Diagnosis not present

## 2021-05-10 DIAGNOSIS — R5383 Other fatigue: Secondary | ICD-10-CM | POA: Diagnosis not present

## 2021-05-10 DIAGNOSIS — U071 COVID-19: Secondary | ICD-10-CM | POA: Diagnosis not present

## 2021-05-10 DIAGNOSIS — F17210 Nicotine dependence, cigarettes, uncomplicated: Secondary | ICD-10-CM | POA: Diagnosis not present

## 2021-05-10 DIAGNOSIS — R030 Elevated blood-pressure reading, without diagnosis of hypertension: Secondary | ICD-10-CM | POA: Diagnosis not present

## 2021-05-10 DIAGNOSIS — M5136 Other intervertebral disc degeneration, lumbar region: Secondary | ICD-10-CM | POA: Diagnosis not present

## 2021-05-10 DIAGNOSIS — E7800 Pure hypercholesterolemia, unspecified: Secondary | ICD-10-CM | POA: Diagnosis not present

## 2021-05-10 DIAGNOSIS — Z79899 Other long term (current) drug therapy: Secondary | ICD-10-CM | POA: Diagnosis not present

## 2021-05-12 DIAGNOSIS — Z79899 Other long term (current) drug therapy: Secondary | ICD-10-CM | POA: Diagnosis not present

## 2021-05-31 DIAGNOSIS — L905 Scar conditions and fibrosis of skin: Secondary | ICD-10-CM | POA: Diagnosis not present

## 2021-05-31 DIAGNOSIS — L729 Follicular cyst of the skin and subcutaneous tissue, unspecified: Secondary | ICD-10-CM | POA: Diagnosis not present

## 2021-06-02 DIAGNOSIS — N390 Urinary tract infection, site not specified: Secondary | ICD-10-CM | POA: Diagnosis not present

## 2021-06-10 DIAGNOSIS — M5136 Other intervertebral disc degeneration, lumbar region: Secondary | ICD-10-CM | POA: Diagnosis not present

## 2021-06-10 DIAGNOSIS — R030 Elevated blood-pressure reading, without diagnosis of hypertension: Secondary | ICD-10-CM | POA: Diagnosis not present

## 2021-06-10 DIAGNOSIS — Z0000 Encounter for general adult medical examination without abnormal findings: Secondary | ICD-10-CM | POA: Diagnosis not present

## 2021-06-10 DIAGNOSIS — Z79899 Other long term (current) drug therapy: Secondary | ICD-10-CM | POA: Diagnosis not present

## 2021-06-10 DIAGNOSIS — M069 Rheumatoid arthritis, unspecified: Secondary | ICD-10-CM | POA: Diagnosis not present

## 2021-06-10 DIAGNOSIS — R945 Abnormal results of liver function studies: Secondary | ICD-10-CM | POA: Diagnosis not present

## 2021-06-14 DIAGNOSIS — Z79899 Other long term (current) drug therapy: Secondary | ICD-10-CM | POA: Diagnosis not present

## 2021-06-17 DIAGNOSIS — H1033 Unspecified acute conjunctivitis, bilateral: Secondary | ICD-10-CM | POA: Diagnosis not present

## 2021-06-17 DIAGNOSIS — R051 Acute cough: Secondary | ICD-10-CM | POA: Diagnosis not present

## 2021-06-17 DIAGNOSIS — M791 Myalgia, unspecified site: Secondary | ICD-10-CM | POA: Diagnosis not present

## 2021-06-17 DIAGNOSIS — J069 Acute upper respiratory infection, unspecified: Secondary | ICD-10-CM | POA: Diagnosis not present

## 2021-06-28 DIAGNOSIS — R945 Abnormal results of liver function studies: Secondary | ICD-10-CM | POA: Diagnosis not present

## 2021-07-08 DIAGNOSIS — M5136 Other intervertebral disc degeneration, lumbar region: Secondary | ICD-10-CM | POA: Diagnosis not present

## 2021-07-08 DIAGNOSIS — K760 Fatty (change of) liver, not elsewhere classified: Secondary | ICD-10-CM | POA: Diagnosis not present

## 2021-07-08 DIAGNOSIS — Z79899 Other long term (current) drug therapy: Secondary | ICD-10-CM | POA: Diagnosis not present

## 2021-07-08 DIAGNOSIS — R030 Elevated blood-pressure reading, without diagnosis of hypertension: Secondary | ICD-10-CM | POA: Diagnosis not present

## 2021-07-14 DIAGNOSIS — Z79899 Other long term (current) drug therapy: Secondary | ICD-10-CM | POA: Diagnosis not present

## 2021-07-21 DIAGNOSIS — E7800 Pure hypercholesterolemia, unspecified: Secondary | ICD-10-CM | POA: Diagnosis not present

## 2021-07-21 DIAGNOSIS — F17200 Nicotine dependence, unspecified, uncomplicated: Secondary | ICD-10-CM | POA: Diagnosis not present

## 2021-07-21 DIAGNOSIS — I10 Essential (primary) hypertension: Secondary | ICD-10-CM | POA: Diagnosis not present

## 2021-07-21 DIAGNOSIS — R7303 Prediabetes: Secondary | ICD-10-CM | POA: Diagnosis not present

## 2021-07-21 DIAGNOSIS — M069 Rheumatoid arthritis, unspecified: Secondary | ICD-10-CM | POA: Diagnosis not present

## 2021-07-21 DIAGNOSIS — D2330 Other benign neoplasm of skin of unspecified part of face: Secondary | ICD-10-CM | POA: Diagnosis not present

## 2021-07-21 DIAGNOSIS — M549 Dorsalgia, unspecified: Secondary | ICD-10-CM | POA: Diagnosis not present

## 2021-07-21 DIAGNOSIS — J4520 Mild intermittent asthma, uncomplicated: Secondary | ICD-10-CM | POA: Diagnosis not present

## 2021-07-21 DIAGNOSIS — K219 Gastro-esophageal reflux disease without esophagitis: Secondary | ICD-10-CM | POA: Diagnosis not present

## 2021-07-28 ENCOUNTER — Other Ambulatory Visit: Payer: Self-pay | Admitting: Family Medicine

## 2021-07-28 DIAGNOSIS — Z1231 Encounter for screening mammogram for malignant neoplasm of breast: Secondary | ICD-10-CM

## 2021-08-03 ENCOUNTER — Telehealth (HOSPITAL_COMMUNITY): Payer: Self-pay

## 2021-08-03 ENCOUNTER — Inpatient Hospital Stay (HOSPITAL_COMMUNITY)
Admission: RE | Admit: 2021-08-03 | Discharge: 2021-08-03 | Disposition: A | Discharge status: Home / Self Care | Payer: Medicare Other | Source: Ambulatory Visit

## 2021-08-03 NOTE — Telephone Encounter (Signed)
Received a call from Sharyn Lull, clinical staff at [facility], returning a call from patient care center staff from yesterday to clarify Ages orders. Explained that RN Renato Gails. spoke with Betti Cruz yesterday who clarified orders and the issue has been resolved; however pt was a no-show for infusion visit today. Sharyn Lull states she will make a note of this for the neurologist.   ?

## 2021-08-05 ENCOUNTER — Ambulatory Visit
Admission: RE | Admit: 2021-08-05 | Discharge: 2021-08-05 | Disposition: A | Discharge status: Home / Self Care | Payer: Medicare Other | Source: Ambulatory Visit

## 2021-08-05 DIAGNOSIS — Z1231 Encounter for screening mammogram for malignant neoplasm of breast: Secondary | ICD-10-CM | POA: Diagnosis not present

## 2021-08-08 ENCOUNTER — Encounter: Payer: Self-pay | Admitting: Plastic Surgery

## 2021-08-08 ENCOUNTER — Other Ambulatory Visit: Payer: Self-pay

## 2021-08-08 ENCOUNTER — Ambulatory Visit (INDEPENDENT_AMBULATORY_CARE_PROVIDER_SITE_OTHER): Payer: Medicare Other | Admitting: Plastic Surgery

## 2021-08-08 VITALS — BP 138/84 | HR 78 | Ht 69.0 in | Wt 176.2 lb

## 2021-08-08 DIAGNOSIS — D489 Neoplasm of uncertain behavior, unspecified: Secondary | ICD-10-CM

## 2021-08-09 DIAGNOSIS — G35 Multiple sclerosis: Secondary | ICD-10-CM | POA: Diagnosis not present

## 2021-08-09 DIAGNOSIS — Z79899 Other long term (current) drug therapy: Secondary | ICD-10-CM | POA: Diagnosis not present

## 2021-08-09 DIAGNOSIS — M5136 Other intervertebral disc degeneration, lumbar region: Secondary | ICD-10-CM | POA: Diagnosis not present

## 2021-08-09 DIAGNOSIS — R030 Elevated blood-pressure reading, without diagnosis of hypertension: Secondary | ICD-10-CM | POA: Diagnosis not present

## 2021-08-09 DIAGNOSIS — M069 Rheumatoid arthritis, unspecified: Secondary | ICD-10-CM | POA: Diagnosis not present

## 2021-08-10 NOTE — Progress Notes (Signed)
? ?Referring Provider ?Shirline Frees, MD ?[address] ?[address] ?[place],  [place] [address]  ? ?CC: chin lesion ? ? ?[name] is an 66 y.o. female.  ?HPI: 66 year old with chin lesion has been present for 3 years.  It occasionally does drain.  It also occasionally does become inflamed.  This is never been biopsied.  She has been told that it is a cystic lesion.  She has seen dermatology ? ?Allergies  ?Allergen Reactions  ? Cymbalta [Duloxetine Hcl] Shortness Of Breath and Swelling  ?  Tongue and leg swelling  ? Cyclobenzaprine Hcl Other (See Comments)  ?  immobility  ? Celexa  [Citalopram Hydrobromide]   ?  Tongue swelling  ? Flexeril [Cyclobenzaprine]   ? Fluticasone Furoate-Vilanterol Swelling and Other (See Comments)  ? Gabapentin   ?  Feels awful when taking   ? Opana  [Oxymorphone Hcl]   ?  Itching, hairloss  ? Tramadol   ?  dizziness  ? ? ?Outpatient Encounter Medications as of 08/08/2021  ?Medication Sig  ? acetaminophen (TYLENOL) 325 MG tablet Take 650 mg by mouth every 6 (six) hours as needed for moderate pain.  ? albuterol (VENTOLIN HFA) 108 (90 Base) MCG/ACT inhaler Inhale 2 puffs into the lungs every 6 (six) hours as needed.  ? amLODipine (NORVASC) 10 MG tablet Take 10 mg by mouth daily.   ? amLODipine (NORVASC) 10 MG tablet Take 1 tablet by mouth daily.  ? amphetamine-dextroamphetamine (ADDERALL) 20 MG tablet 1/2 tablet  ? baclofen (LIORESAL) 10 MG tablet Take 10 mg by mouth 3 (three) times daily as needed.  ? Cyanocobalamin (B-12 PO) Take 1 tablet by mouth daily.  ? diclofenac Sodium (VOLTAREN) 1 % GEL Apply 1 application topically in the morning, at noon, in the evening, and at bedtime.  ? diclofenac Sodium (VOLTAREN) 1 % GEL See admin instructions.  ? Fluticasone-Salmeterol (ADVAIR) 250-50 MCG/DOSE AEPB Inhale 1 puff into the lungs daily as needed (breathing.).   ? hydroxychloroquine (PLAQUENIL) 200 MG tablet Take 200 mg by mouth 2 (two) times daily.  ? hydroxychloroquine (PLAQUENIL) 200  MG tablet 1 tablet  ? irbesartan (AVAPRO) 300 MG tablet Take 300 mg by mouth daily.  ? LORazepam (ATIVAN) 0.5 MG tablet Take 0.5 mg by mouth every 8 (eight) hours.  ? Magnesium 500 MG TABS 1 tablet with a meal  ? meclizine (ANTIVERT) 12.5 MG tablet Take 6.25-12.5 mg by mouth 3 (three) times daily as needed.  ? mupirocin ointment (BACTROBAN) 2 % Apply topically 3 (three) times daily as needed.  ? naproxen (EC NAPROSYN) 500 MG EC tablet naproxen 500 mg tablet,delayed release ? TAKE 1 TABLET BY MOUTH EVERY 12 HOURS AS NEEDED  ? naproxen (NAPROSYN) 500 MG tablet Take 500 mg by mouth 2 (two) times daily as needed for pain.  ? omeprazole (PRILOSEC) 20 MG capsule omeprazole 20 mg capsule,delayed release ? TAKE 1 CAPSULE BY MOUTH EVERY DAY  ? oxybutynin (DITROPAN-XL) 10 MG 24 hr tablet 1/2 tablet  ? Oxycodone HCl 20 MG TABS Take 20 mg by mouth daily.  ? tiZANidine (ZANAFLEX) 2 MG tablet Take 2 mg by mouth at bedtime as needed for muscle spasms.  ? Turmeric 500 MG CAPS Take 1 capsule by mouth daily.  ? XTAMPZA ER 9 MG C12A Take 1 capsule by mouth at bedtime.  ? [DISCONTINUED] MAGNESIUM PO Take 1 tablet by mouth daily.  ? [DISCONTINUED] Multiple Vitamin (MULTIVITAMIN WITH MINERALS) TABS tablet Take 1 tablet by mouth daily.  ? [  DISCONTINUED] promethazine-dextromethorphan (PROMETHAZINE-DM) 6.25-15 MG/5ML syrup Take 5 mLs by mouth 4 (four) times daily as needed for cough.  ? ?No facility-administered encounter medications on file as of 08/08/2021.  ?  ? ?Past Medical History:  ?Diagnosis Date  ? Anxiety   ? Asthma   ? Back problem   ? disc disease, chronic low back pain, right leg pain, s/p discectomy 99  ? Depression   ? Diastolic dysfunction   ? with elevated LVEDP at cath  ? Endometriosis   ? Fibroids   ? hx of   ? GERD (gastroesophageal reflux disease)   ? Hepatitis C   ? Hidradenitis suppurativa   ? Hypertension   ? Osteoarthritis   ? Prediabetes   ? RA (rheumatoid arthritis) ([place])   ? Rotator cuff tear   ? right shoulder   ?  Syncope   ? ? ?Past Surgical History:  ?Procedure Laterality Date  ? ANGIOPLASTY    ? BACK SURGERY    ? COLONOSCOPY  07/2010  ? tubular adenoma  ? COLONOSCOPY  08/2015  ? DISKECTOMY    ? laparoscopy for endometriosis    ? left shoulder scope    ? ROTATOR CUFF REPAIR    ? Spinal Fusion  12/2016  ? L4-S1  ? ? ?Family History  ?Problem Relation Age of Onset  ? Heart disease Mother   ?     Died with MI 17, chest pain 30s  ? Hypertension Mother   ? Pancreatitis Father   ? Atrial fibrillation Sister   ? Breast cancer Maternal Aunt   ? Multiple sclerosis Cousin   ? ? ?Social History  ? ?Social History Narrative  ? Lives alone  ? Right handed  ? Caffeine: none   ?  ? ?Review of Systems ?General: Denies fevers, chills, weight loss ?CV: Denies chest pain, shortness of breath, palpitations ? ? ?Physical Exam ? ?  08/08/2021  ?  1:45 PM 04/27/2021  ?  7:23 PM 04/27/2021  ?  6:25 PM  ?Vitals with BMI  ?Height '5\' 9"'$     ?Weight 176 lbs 3 oz    ?BMI 26.01    ?Systolic 388 875   ?Diastolic 84 91   ?Pulse 78 83 99  ?  ?General:  No acute distress,  Alert and oriented, Non-Toxic, Normal speech and affect ?HEENT: 1 x 0.6 cm right chin lesion ? ?Assessment/Plan ?66 year old with cystic lesion right chin.  Excision under local indicated, cystic ? ?[name] ?08/10/2021, 11:04 AM  ? ? ?  ?

## 2021-08-29 ENCOUNTER — Other Ambulatory Visit (HOSPITAL_COMMUNITY)
Admission: RE | Admit: 2021-08-29 | Discharge: 2021-08-29 | Disposition: A | Discharge status: Home / Self Care | Payer: Medicare Other | Source: Ambulatory Visit | Attending: Plastic Surgery | Admitting: Plastic Surgery

## 2021-08-29 ENCOUNTER — Ambulatory Visit: Payer: Medicare Other | Admitting: Plastic Surgery

## 2021-08-29 VITALS — BP 139/90 | HR 89 | Ht 69.0 in | Wt 174.4 lb

## 2021-08-29 DIAGNOSIS — D489 Neoplasm of uncertain behavior, unspecified: Secondary | ICD-10-CM | POA: Insufficient documentation

## 2021-08-29 DIAGNOSIS — L905 Scar conditions and fibrosis of skin: Secondary | ICD-10-CM | POA: Diagnosis not present

## 2021-08-29 DIAGNOSIS — L0889 Other specified local infections of the skin and subcutaneous tissue: Secondary | ICD-10-CM | POA: Diagnosis not present

## 2021-08-29 NOTE — Progress Notes (Signed)
Operative Note  ? ?DATE OF OPERATION: 08/29/2021 ? ?LOCATION:   ? ?SURGICAL DEPARTMENT: Plastic Surgery ? ?PREOPERATIVE DIAGNOSES:  right face lesion ? ?POSTOPERATIVE DIAGNOSES:  same ? ?PROCEDURE:  ?Excision of right face lesion measuring 1.2 cm ?Intermediate closure measuring 1.2 cm right face ? ?SURGEON: Melene Plan. Temara Lanum, MD ? ?ANESTHESIA:  Local ? ?COMPLICATIONS: None.  ? ?INDICATIONS FOR PROCEDURE:  ?The patient, [name] is a 66 y.o. female born on [birth year], is here for treatment of right face cystic lesion. ?MRN: [id number] ? ?CONSENT:  ?Informed consent was obtained directly from the patient. Risks, benefits and alternatives were fully discussed. Specific risks including but not limited to bleeding, infection, hematoma, seroma, scarring, pain, infection, wound healing problems, and need for further surgery were all discussed. The patient did have an ample opportunity to have questions answered to satisfaction.  ? ?DESCRIPTION OF PROCEDURE:  ?Local anesthesia was administered. The patient's operative site was prepped and draped in a sterile fashion. A time out was performed and all information was confirmed to be correct.  The lesion was excised with a 15 blade.  Hemostasis was obtained.  Circumferential undermining was performed and the skin was advanced and closed in layers with interrupted buried Monocryl sutures and Prolene for the skin.  The lesion excised measured 1.2 cm, and the total length of closure measured 1.2 cm.   ? ?The patient tolerated the procedure well.  There were no complications. ?  ?

## 2021-09-01 LAB — SURGICAL PATHOLOGY

## 2021-09-06 DIAGNOSIS — M5136 Other intervertebral disc degeneration, lumbar region: Secondary | ICD-10-CM | POA: Diagnosis not present

## 2021-09-06 DIAGNOSIS — G35 Multiple sclerosis: Secondary | ICD-10-CM | POA: Diagnosis not present

## 2021-09-06 DIAGNOSIS — R030 Elevated blood-pressure reading, without diagnosis of hypertension: Secondary | ICD-10-CM | POA: Diagnosis not present

## 2021-09-06 DIAGNOSIS — Z79899 Other long term (current) drug therapy: Secondary | ICD-10-CM | POA: Diagnosis not present

## 2021-09-06 DIAGNOSIS — M069 Rheumatoid arthritis, unspecified: Secondary | ICD-10-CM | POA: Diagnosis not present

## 2021-09-08 DIAGNOSIS — Z79899 Other long term (current) drug therapy: Secondary | ICD-10-CM | POA: Diagnosis not present

## 2021-09-09 ENCOUNTER — Ambulatory Visit (INDEPENDENT_AMBULATORY_CARE_PROVIDER_SITE_OTHER): Payer: Medicare Other | Admitting: Plastic Surgery

## 2021-09-09 DIAGNOSIS — R945 Abnormal results of liver function studies: Secondary | ICD-10-CM | POA: Diagnosis not present

## 2021-09-09 DIAGNOSIS — Z8619 Personal history of other infectious and parasitic diseases: Secondary | ICD-10-CM | POA: Diagnosis not present

## 2021-09-09 DIAGNOSIS — Z09 Encounter for follow-up examination after completed treatment for conditions other than malignant neoplasm: Secondary | ICD-10-CM

## 2021-09-09 DIAGNOSIS — K760 Fatty (change of) liver, not elsewhere classified: Secondary | ICD-10-CM | POA: Diagnosis not present

## 2021-09-09 DIAGNOSIS — D489 Neoplasm of uncertain behavior, unspecified: Secondary | ICD-10-CM

## 2021-09-09 NOTE — Progress Notes (Signed)
Status post excision of right face cyst, doing well ? ?Physical exam ?Incision clean dry intact ? ?Pathology: ?A. SKIN, RIGHT FACE, EXCISION:  ?Compatible with benign epidermal inclusion cyst with chronic  ?inflammation and dermal fibrosis consistent with prior rupture  ? ?Assessment and plan ?Doing well status post right face cyst excision.  Discussed scar massage sunscreen and follow-up as needed. ?

## 2021-09-14 DIAGNOSIS — R7303 Prediabetes: Secondary | ICD-10-CM | POA: Diagnosis not present

## 2021-09-14 DIAGNOSIS — J4520 Mild intermittent asthma, uncomplicated: Secondary | ICD-10-CM | POA: Diagnosis not present

## 2021-09-14 DIAGNOSIS — R350 Frequency of micturition: Secondary | ICD-10-CM | POA: Diagnosis not present

## 2021-09-14 DIAGNOSIS — M069 Rheumatoid arthritis, unspecified: Secondary | ICD-10-CM | POA: Diagnosis not present

## 2021-09-14 DIAGNOSIS — E7800 Pure hypercholesterolemia, unspecified: Secondary | ICD-10-CM | POA: Diagnosis not present

## 2021-09-14 DIAGNOSIS — K760 Fatty (change of) liver, not elsewhere classified: Secondary | ICD-10-CM | POA: Diagnosis not present

## 2021-09-14 DIAGNOSIS — I10 Essential (primary) hypertension: Secondary | ICD-10-CM | POA: Diagnosis not present

## 2021-09-14 DIAGNOSIS — G35 Multiple sclerosis: Secondary | ICD-10-CM | POA: Diagnosis not present

## 2021-09-14 DIAGNOSIS — G894 Chronic pain syndrome: Secondary | ICD-10-CM | POA: Diagnosis not present

## 2021-09-14 DIAGNOSIS — K219 Gastro-esophageal reflux disease without esophagitis: Secondary | ICD-10-CM | POA: Diagnosis not present

## 2021-09-22 DIAGNOSIS — M62838 Other muscle spasm: Secondary | ICD-10-CM | POA: Diagnosis not present

## 2021-09-22 DIAGNOSIS — G35 Multiple sclerosis: Secondary | ICD-10-CM | POA: Diagnosis not present

## 2021-09-22 DIAGNOSIS — R519 Headache, unspecified: Secondary | ICD-10-CM | POA: Diagnosis not present

## 2021-09-23 DIAGNOSIS — G35 Multiple sclerosis: Secondary | ICD-10-CM | POA: Diagnosis not present

## 2021-09-23 DIAGNOSIS — E559 Vitamin D deficiency, unspecified: Secondary | ICD-10-CM | POA: Diagnosis not present

## 2021-09-23 DIAGNOSIS — D72819 Decreased white blood cell count, unspecified: Secondary | ICD-10-CM | POA: Diagnosis not present

## 2021-09-26 DIAGNOSIS — M8589 Other specified disorders of bone density and structure, multiple sites: Secondary | ICD-10-CM | POA: Diagnosis not present

## 2021-09-29 ENCOUNTER — Other Ambulatory Visit: Payer: Self-pay | Admitting: Psychiatry

## 2021-09-29 DIAGNOSIS — G35 Multiple sclerosis: Secondary | ICD-10-CM

## 2021-10-04 DIAGNOSIS — Z79899 Other long term (current) drug therapy: Secondary | ICD-10-CM | POA: Diagnosis not present

## 2021-10-04 DIAGNOSIS — G35 Multiple sclerosis: Secondary | ICD-10-CM | POA: Diagnosis not present

## 2021-10-04 DIAGNOSIS — R030 Elevated blood-pressure reading, without diagnosis of hypertension: Secondary | ICD-10-CM | POA: Diagnosis not present

## 2021-10-04 DIAGNOSIS — M069 Rheumatoid arthritis, unspecified: Secondary | ICD-10-CM | POA: Diagnosis not present

## 2021-10-04 DIAGNOSIS — M5136 Other intervertebral disc degeneration, lumbar region: Secondary | ICD-10-CM | POA: Diagnosis not present

## 2021-10-06 DIAGNOSIS — Z79899 Other long term (current) drug therapy: Secondary | ICD-10-CM | POA: Diagnosis not present

## 2021-10-07 DIAGNOSIS — J329 Chronic sinusitis, unspecified: Secondary | ICD-10-CM | POA: Diagnosis not present

## 2021-10-07 DIAGNOSIS — H1031 Unspecified acute conjunctivitis, right eye: Secondary | ICD-10-CM | POA: Diagnosis not present

## 2021-10-07 DIAGNOSIS — J40 Bronchitis, not specified as acute or chronic: Secondary | ICD-10-CM | POA: Diagnosis not present

## 2021-10-07 DIAGNOSIS — R059 Cough, unspecified: Secondary | ICD-10-CM | POA: Diagnosis not present

## 2021-10-18 ENCOUNTER — Ambulatory Visit
Admission: RE | Admit: 2021-10-18 | Discharge: 2021-10-18 | Disposition: A | Discharge status: Home / Self Care | Payer: Medicare Other | Source: Ambulatory Visit | Attending: Psychiatry | Admitting: Psychiatry

## 2021-10-18 DIAGNOSIS — G35 Multiple sclerosis: Secondary | ICD-10-CM | POA: Diagnosis not present

## 2021-10-18 DIAGNOSIS — J3489 Other specified disorders of nose and nasal sinuses: Secondary | ICD-10-CM | POA: Diagnosis not present

## 2021-10-18 DIAGNOSIS — J329 Chronic sinusitis, unspecified: Secondary | ICD-10-CM | POA: Diagnosis not present

## 2021-10-18 MED ORDER — GADOBENATE DIMEGLUMINE 529 MG/ML IV SOLN
15.0000 mL | Freq: Once | INTRAVENOUS | Status: AC | PRN
  Administered 2021-10-18: 15 mL via INTRAVENOUS

## 2021-11-02 DIAGNOSIS — Z79899 Other long term (current) drug therapy: Secondary | ICD-10-CM | POA: Diagnosis not present

## 2021-11-02 DIAGNOSIS — M5136 Other intervertebral disc degeneration, lumbar region: Secondary | ICD-10-CM | POA: Diagnosis not present

## 2021-11-03 DIAGNOSIS — G35 Multiple sclerosis: Secondary | ICD-10-CM | POA: Diagnosis not present

## 2021-11-09 DIAGNOSIS — N319 Neuromuscular dysfunction of bladder, unspecified: Secondary | ICD-10-CM | POA: Diagnosis not present

## 2021-11-09 DIAGNOSIS — R3915 Urgency of urination: Secondary | ICD-10-CM | POA: Diagnosis not present

## 2021-11-22 DIAGNOSIS — R3915 Urgency of urination: Secondary | ICD-10-CM | POA: Diagnosis not present

## 2021-11-30 DIAGNOSIS — Z79899 Other long term (current) drug therapy: Secondary | ICD-10-CM | POA: Diagnosis not present

## 2021-11-30 DIAGNOSIS — G35 Multiple sclerosis: Secondary | ICD-10-CM | POA: Diagnosis not present

## 2021-11-30 DIAGNOSIS — K760 Fatty (change of) liver, not elsewhere classified: Secondary | ICD-10-CM | POA: Diagnosis not present

## 2021-11-30 DIAGNOSIS — M5136 Other intervertebral disc degeneration, lumbar region: Secondary | ICD-10-CM | POA: Diagnosis not present

## 2021-11-30 DIAGNOSIS — M069 Rheumatoid arthritis, unspecified: Secondary | ICD-10-CM | POA: Diagnosis not present

## 2021-12-05 DIAGNOSIS — G35 Multiple sclerosis: Secondary | ICD-10-CM | POA: Diagnosis not present

## 2021-12-05 DIAGNOSIS — Z79899 Other long term (current) drug therapy: Secondary | ICD-10-CM | POA: Diagnosis not present

## 2021-12-05 DIAGNOSIS — M0579 Rheumatoid arthritis with rheumatoid factor of multiple sites without organ or systems involvement: Secondary | ICD-10-CM | POA: Diagnosis not present

## 2021-12-05 DIAGNOSIS — M1991 Primary osteoarthritis, unspecified site: Secondary | ICD-10-CM | POA: Diagnosis not present

## 2021-12-15 DIAGNOSIS — N319 Neuromuscular dysfunction of bladder, unspecified: Secondary | ICD-10-CM | POA: Diagnosis not present

## 2021-12-21 DIAGNOSIS — U071 COVID-19: Secondary | ICD-10-CM | POA: Diagnosis not present

## 2021-12-26 DIAGNOSIS — Z9189 Other specified personal risk factors, not elsewhere classified: Secondary | ICD-10-CM | POA: Diagnosis not present

## 2021-12-27 DIAGNOSIS — N319 Neuromuscular dysfunction of bladder, unspecified: Secondary | ICD-10-CM | POA: Diagnosis not present

## 2021-12-27 DIAGNOSIS — N130 Hydronephrosis with ureteropelvic junction obstruction: Secondary | ICD-10-CM | POA: Diagnosis not present

## 2021-12-27 DIAGNOSIS — R339 Retention of urine, unspecified: Secondary | ICD-10-CM | POA: Diagnosis not present

## 2021-12-29 DIAGNOSIS — M545 Low back pain, unspecified: Secondary | ICD-10-CM | POA: Diagnosis not present

## 2021-12-29 DIAGNOSIS — M542 Cervicalgia: Secondary | ICD-10-CM | POA: Diagnosis not present

## 2021-12-29 DIAGNOSIS — Z79899 Other long term (current) drug therapy: Secondary | ICD-10-CM | POA: Diagnosis not present

## 2021-12-29 DIAGNOSIS — G894 Chronic pain syndrome: Secondary | ICD-10-CM | POA: Diagnosis not present

## 2022-01-02 DIAGNOSIS — R339 Retention of urine, unspecified: Secondary | ICD-10-CM | POA: Diagnosis not present

## 2022-01-16 DIAGNOSIS — R319 Hematuria, unspecified: Secondary | ICD-10-CM | POA: Diagnosis not present

## 2022-01-16 DIAGNOSIS — N390 Urinary tract infection, site not specified: Secondary | ICD-10-CM | POA: Diagnosis not present

## 2022-01-24 DIAGNOSIS — Z0189 Encounter for other specified special examinations: Secondary | ICD-10-CM | POA: Diagnosis not present

## 2022-01-24 DIAGNOSIS — G35 Multiple sclerosis: Secondary | ICD-10-CM | POA: Diagnosis not present

## 2022-01-26 DIAGNOSIS — N319 Neuromuscular dysfunction of bladder, unspecified: Secondary | ICD-10-CM | POA: Diagnosis not present

## 2022-01-27 DIAGNOSIS — Z79899 Other long term (current) drug therapy: Secondary | ICD-10-CM | POA: Diagnosis not present

## 2022-01-27 DIAGNOSIS — M5136 Other intervertebral disc degeneration, lumbar region: Secondary | ICD-10-CM | POA: Diagnosis not present

## 2022-02-02 DIAGNOSIS — Z79899 Other long term (current) drug therapy: Secondary | ICD-10-CM | POA: Diagnosis not present

## 2022-02-23 DIAGNOSIS — M62838 Other muscle spasm: Secondary | ICD-10-CM | POA: Diagnosis not present

## 2022-02-23 DIAGNOSIS — G35 Multiple sclerosis: Secondary | ICD-10-CM | POA: Diagnosis not present

## 2022-02-23 DIAGNOSIS — R519 Headache, unspecified: Secondary | ICD-10-CM | POA: Diagnosis not present

## 2022-02-24 DIAGNOSIS — Z79899 Other long term (current) drug therapy: Secondary | ICD-10-CM | POA: Diagnosis not present

## 2022-02-24 DIAGNOSIS — M5136 Other intervertebral disc degeneration, lumbar region: Secondary | ICD-10-CM | POA: Diagnosis not present

## 2022-02-27 DIAGNOSIS — R339 Retention of urine, unspecified: Secondary | ICD-10-CM | POA: Diagnosis not present

## 2022-03-02 DIAGNOSIS — Z79899 Other long term (current) drug therapy: Secondary | ICD-10-CM | POA: Diagnosis not present

## 2022-03-15 DIAGNOSIS — N130 Hydronephrosis with ureteropelvic junction obstruction: Secondary | ICD-10-CM | POA: Diagnosis not present

## 2022-03-22 DIAGNOSIS — N130 Hydronephrosis with ureteropelvic junction obstruction: Secondary | ICD-10-CM | POA: Diagnosis not present

## 2022-03-22 DIAGNOSIS — N319 Neuromuscular dysfunction of bladder, unspecified: Secondary | ICD-10-CM | POA: Diagnosis not present

## 2022-04-10 DIAGNOSIS — M549 Dorsalgia, unspecified: Secondary | ICD-10-CM | POA: Diagnosis not present

## 2022-04-10 DIAGNOSIS — N390 Urinary tract infection, site not specified: Secondary | ICD-10-CM | POA: Diagnosis not present

## 2022-04-10 DIAGNOSIS — G35 Multiple sclerosis: Secondary | ICD-10-CM | POA: Diagnosis not present

## 2022-04-10 DIAGNOSIS — R1319 Other dysphagia: Secondary | ICD-10-CM | POA: Diagnosis not present

## 2022-04-10 DIAGNOSIS — I10 Essential (primary) hypertension: Secondary | ICD-10-CM | POA: Diagnosis not present

## 2022-04-10 DIAGNOSIS — M069 Rheumatoid arthritis, unspecified: Secondary | ICD-10-CM | POA: Diagnosis not present

## 2022-04-10 DIAGNOSIS — R0789 Other chest pain: Secondary | ICD-10-CM | POA: Diagnosis not present

## 2022-04-10 DIAGNOSIS — Z0000 Encounter for general adult medical examination without abnormal findings: Secondary | ICD-10-CM | POA: Diagnosis not present

## 2022-04-10 DIAGNOSIS — E7800 Pure hypercholesterolemia, unspecified: Secondary | ICD-10-CM | POA: Diagnosis not present

## 2022-04-10 DIAGNOSIS — H538 Other visual disturbances: Secondary | ICD-10-CM | POA: Diagnosis not present

## 2022-04-10 DIAGNOSIS — R7303 Prediabetes: Secondary | ICD-10-CM | POA: Diagnosis not present

## 2022-04-11 DIAGNOSIS — K5730 Diverticulosis of large intestine without perforation or abscess without bleeding: Secondary | ICD-10-CM | POA: Diagnosis not present

## 2022-04-11 DIAGNOSIS — N201 Calculus of ureter: Secondary | ICD-10-CM | POA: Diagnosis not present

## 2022-04-11 DIAGNOSIS — N1339 Other hydronephrosis: Secondary | ICD-10-CM | POA: Diagnosis not present

## 2022-04-17 ENCOUNTER — Encounter: Payer: Self-pay | Admitting: Gynecologic Oncology

## 2022-04-17 ENCOUNTER — Telehealth: Payer: Self-pay | Admitting: *Deleted

## 2022-04-17 ENCOUNTER — Telehealth: Payer: Self-pay

## 2022-04-17 DIAGNOSIS — N201 Calculus of ureter: Secondary | ICD-10-CM | POA: Diagnosis not present

## 2022-04-17 DIAGNOSIS — N130 Hydronephrosis with ureteropelvic junction obstruction: Secondary | ICD-10-CM | POA: Diagnosis not present

## 2022-04-17 NOTE — Telephone Encounter (Signed)
Spoke with the patient regarding the referral to GYN oncology. Patient scheduled as new patient with Dr Berline Lopes at 12/12 at 10:30 am. Patient given an arrival time of 10 am.  Explained to the patient the the doctor will perform a pelvic exam at this visit. Patient given the policy that no visitors under the 16 yrs are allowed in the [place]. Patient given the address/phone number for the clinic and that the center offers free valet service.

## 2022-04-17 NOTE — Telephone Encounter (Signed)
Spoke with [name] regarding her referral to GYN oncology. She has an appointment scheduled with Dr. Berline Lopes on 04/18/22 at 10:30. Patient agrees to date and time. She has been provided with office address and location. She is also aware of our mask and visitor policy. Patient verbalized understanding and will call with any questions.

## 2022-04-18 ENCOUNTER — Telehealth: Payer: Self-pay | Admitting: Oncology

## 2022-04-18 ENCOUNTER — Other Ambulatory Visit: Payer: Self-pay

## 2022-04-18 ENCOUNTER — Encounter: Payer: Self-pay | Admitting: Oncology

## 2022-04-18 ENCOUNTER — Encounter: Payer: Self-pay | Admitting: Gynecologic Oncology

## 2022-04-18 ENCOUNTER — Inpatient Hospital Stay: Payer: Medicare Other | Attending: Gynecologic Oncology | Admitting: Gynecologic Oncology

## 2022-04-18 ENCOUNTER — Ambulatory Visit
Admission: RE | Admit: 2022-04-18 | Discharge: 2022-04-18 | Disposition: A | Discharge status: Home / Self Care | Payer: Self-pay | Source: Ambulatory Visit | Attending: Gynecologic Oncology | Admitting: Gynecologic Oncology

## 2022-04-18 ENCOUNTER — Inpatient Hospital Stay: Payer: Medicare Other

## 2022-04-18 VITALS — BP 149/86 | HR 95 | Temp 98.0°F | Resp 16 | Ht 68.58 in | Wt 176.0 lb

## 2022-04-18 DIAGNOSIS — Z79891 Long term (current) use of opiate analgesic: Secondary | ICD-10-CM | POA: Diagnosis not present

## 2022-04-18 DIAGNOSIS — M069 Rheumatoid arthritis, unspecified: Secondary | ICD-10-CM | POA: Diagnosis not present

## 2022-04-18 DIAGNOSIS — R110 Nausea: Secondary | ICD-10-CM | POA: Diagnosis not present

## 2022-04-18 DIAGNOSIS — R197 Diarrhea, unspecified: Secondary | ICD-10-CM | POA: Diagnosis not present

## 2022-04-18 DIAGNOSIS — Z79899 Other long term (current) drug therapy: Secondary | ICD-10-CM | POA: Diagnosis not present

## 2022-04-18 DIAGNOSIS — G35 Multiple sclerosis: Secondary | ICD-10-CM | POA: Insufficient documentation

## 2022-04-18 DIAGNOSIS — C800 Disseminated malignant neoplasm, unspecified: Secondary | ICD-10-CM | POA: Diagnosis not present

## 2022-04-18 DIAGNOSIS — R140 Abdominal distension (gaseous): Secondary | ICD-10-CM | POA: Diagnosis not present

## 2022-04-18 DIAGNOSIS — F32A Depression, unspecified: Secondary | ICD-10-CM | POA: Diagnosis not present

## 2022-04-18 DIAGNOSIS — I10 Essential (primary) hypertension: Secondary | ICD-10-CM | POA: Diagnosis not present

## 2022-04-18 DIAGNOSIS — F419 Anxiety disorder, unspecified: Secondary | ICD-10-CM | POA: Diagnosis not present

## 2022-04-18 DIAGNOSIS — Z87891 Personal history of nicotine dependence: Secondary | ICD-10-CM | POA: Diagnosis not present

## 2022-04-18 DIAGNOSIS — K5900 Constipation, unspecified: Secondary | ICD-10-CM | POA: Diagnosis not present

## 2022-04-18 DIAGNOSIS — R6881 Early satiety: Secondary | ICD-10-CM | POA: Diagnosis not present

## 2022-04-18 DIAGNOSIS — K219 Gastro-esophageal reflux disease without esophagitis: Secondary | ICD-10-CM | POA: Insufficient documentation

## 2022-04-18 DIAGNOSIS — G8929 Other chronic pain: Secondary | ICD-10-CM | POA: Diagnosis not present

## 2022-04-18 DIAGNOSIS — C786 Secondary malignant neoplasm of retroperitoneum and peritoneum: Secondary | ICD-10-CM | POA: Insufficient documentation

## 2022-04-18 DIAGNOSIS — M1990 Unspecified osteoarthritis, unspecified site: Secondary | ICD-10-CM | POA: Insufficient documentation

## 2022-04-18 DIAGNOSIS — R102 Pelvic and perineal pain: Secondary | ICD-10-CM | POA: Insufficient documentation

## 2022-04-18 DIAGNOSIS — R971 Elevated cancer antigen 125 [CA 125]: Secondary | ICD-10-CM | POA: Diagnosis not present

## 2022-04-18 DIAGNOSIS — Z7951 Long term (current) use of inhaled steroids: Secondary | ICD-10-CM | POA: Insufficient documentation

## 2022-04-18 DIAGNOSIS — J45909 Unspecified asthma, uncomplicated: Secondary | ICD-10-CM | POA: Insufficient documentation

## 2022-04-18 DIAGNOSIS — L732 Hidradenitis suppurativa: Secondary | ICD-10-CM | POA: Insufficient documentation

## 2022-04-18 DIAGNOSIS — R630 Anorexia: Secondary | ICD-10-CM | POA: Diagnosis not present

## 2022-04-18 DIAGNOSIS — N838 Other noninflammatory disorders of ovary, fallopian tube and broad ligament: Secondary | ICD-10-CM

## 2022-04-18 DIAGNOSIS — R109 Unspecified abdominal pain: Secondary | ICD-10-CM | POA: Insufficient documentation

## 2022-04-18 LAB — BASIC METABOLIC PANEL
Anion gap: 8 (ref 5–15)
BUN: 22 mg/dL (ref 8–23)
CO2: 28 mmol/L (ref 22–32)
Calcium: 10.1 mg/dL (ref 8.9–10.3)
Chloride: 105 mmol/L (ref 98–111)
Creatinine, Ser: 0.85 mg/dL (ref 0.44–1.00)
GFR, Estimated: >60 mL/min (ref >60)
Glucose, Bld: 96 mg/dL (ref 70–99)
Potassium: 4.1 mmol/L (ref 3.5–5.1)
Sodium: 141 mmol/L (ref 135–145)

## 2022-04-18 LAB — CEA (ACCESS): CEA (CHCC): 1.69 ng/mL (ref 0.00–5.00)

## 2022-04-18 NOTE — Progress Notes (Unsigned)
GYNECOLOGIC ONCOLOGY NEW PATIENT CONSULTATION   Patient Name: [name]  Patient Age: 66 y.o. Date of Service: 04/19/22 Referring Provider: Rexene Alberts, MD  Primary Care Provider: Shirline Frees, MD Consulting Provider: Jeral Pinch, MD   Assessment/Plan:  Postmenopausal patient with worsening abdominal symptoms and imaging findings concerning for metastatic gynecologic malignancy.  I reviewed recent outside CT scan with the patient.  We looked at the report together although pictures were not available during her visit.  I was able to pull these up on canopy after seeing her.  Unfortunately, the study was done without contrast but does have evidence of peritoneal and mesenteric implants and a left adnexal mass that is indistinguishable from the adjacent sigmoid colon.  In reviewing her CT scan from 2016, the left ovary is perhaps a little bit more prominent but without definitive mass.  I do appreciate any evidence of peritoneal disease on the 2016 scan.  I reviewed recommendation to proceed with CT scan with contrast.  We will plan to also include CT scan of the chest.  We discussed obtaining tumor markers including CEA and Ca1 25.  I stressed that neither of these are diagnostic but help guide initial decisions regarding treatment plan as well as help with assessing treatment response and during surveillance after completion of primary treatment.  The patient signed release of records today so that we may obtain prior GYN records from her OB/GYN, Dr. Landry Mellow.  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 I discussed that the treatment approach for this disease is typically combination of cytoreductive surgery and chemotherapy. I discussed that sequencing of this can be either with upfront debulking followed by adjuvant chemotherapy sequentially or neoadjuvant chemotherapy followed by an interval cytoreductive attempt, then additional chemotherapy. This latter  approach is associated with a reduced perioperative morbidity at the time of surgery.  I discussed that decisions regarding sequencing of therapy is individualized taking into account individual patient health, in addition to the apparent tumor distribution on imaging, and likelihood of complete surgical resection at the time of surgery. I discussed that the overall survival observed in patients is equivalent for both approaches (neoadjuvant chemotherapy versus primary debulking surgery) provided that there is an optimal cytoreductive effort at the time of surgery (regardless of the timing of that surgery).   I will call the patient when I have her CT results.  Given disease described on her outside CT report and based on my review of the images, given extent of disease burden, I would favor a neoadjuvant approach for her.  We will confirm this when I call her after CT scan has been performed.  Given anticipation of neoadjuvant chemotherapy, the patient was scheduled to see Dr. Alvy Bimler later this week.  We discussed that if pursuing a neoadjuvant approach, plan would be to obtain tissue biopsy (either percutaneously or via laparoscopy) prior to initiation of systemic therapy.  A copy of this note was sent to the patient's referring provider.   64 minutes of total time was spent for this patient encounter, including preparation, face-to-face counseling with the patient and coordination of care, and documentation of the encounter.  Jeral Pinch, MD  Division of Gynecologic Oncology  Department of Obstetrics and Gynecology  [facility]  ___________________________________________  Chief Complaint: No chief complaint on file.   History of Present Illness:  [name] is a 66 y.o. y.o. female who is seen in consultation at the request of Dr.  Gay for an evaluation of for imaging findings concerning for metastatic gynecologic malignancy.  Patient was seen by Dr. Abner Greenspan  in mid November.  This is in the setting of her detrusor sphincter dyssynergia neurogenic bladder with recent diagnosis of multiple sclerosis in February of this year.  The patient endorsed lower suprapubic pressure and incomplete emptying.  On renal ultrasound in November, there was persistent fullness with dilated proximal left ureter as well as hypoechoic areas in her left renal collecting system.  CT of the abdomen and pelvis on 04/11/2022 reveals a left adnexal masslike area measuring 5.9 x 3.6 cm.  Margins of this mass are irregular.  There is small fluid in the cul-de-sac as well as fullness of soft tissue about the right adnexa.  Discrete uterine structure is not identified.  Left upper quadrant nodules beneath the left hemidiaphragm (1 anterior to the spleen and the other to the stomach).  Omental/peritoneal nodularity and multiple perihepatic nodules are noted.  Findings are highly concerning if not compatible with peritoneal disease from potential ovarian primary.  Nodule in the left lower quadrant adjacent to the sigmoid colon measures 16 mm and is concerning for metastatic disease.  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  Right adrenal nodule is indeterminate but unchanged in size since 2018.  Today, the patient endorses chronic fatigue.  She notes at least 3 years of chronic fatigue.  She notes at least 3 years of suprapubic pain that has slowly worsened over time.  She describes this pain as bad cramping, sometimes feels heaviness in her lower abdomen causing her to hold her belly.  She takes pain medication for rheumatoid arthritis, not specifically for this pain.  She denies any radiation of the pain or associated symptoms.  In the last year, she has had some change to her bowel function.  She has  intermittent constipation as well as diarrhea.  She feels like sometimes it is difficult to empty her bowels.  Urinary function has also changed over the past year.  She often feels that her urine flow will stop, has to sit on the toilet for a period of time before she is able to start her stream again.  Ultimately, she feels as if she is able to empty her bladder.  Denies any vaginal bleeding or discharge.  Has noticed some previous blood in her urine, which she thinks was related to a kidney stone.  She denies any recent weight changes.  She endorses decreased appetite, does not feel as hungry as she previously did.  She also endorses early satiety as well as bloating.  For the first time she had some very mild nausea yesterday.  She denies any emesis.  She has a history of rheumatoid arthritis.  Multiple sclerosis was diagnosed earlier this year given symptoms related to vertigo and losing her concentration.  PAST MEDICAL HISTORY:  Past Medical History:  Diagnosis Date   Anxiety    Asthma    Back problem    disc disease, chronic low back pain, right leg pain, s/p discectomy 99   Depression    Diastolic dysfunction    with elevated LVEDP at cath   Endometriosis    Fibroids    hx of    GERD (gastroesophageal reflux disease)    Hepatitis C    treated  8-9 years ago   Hidradenitis suppurativa    Hypertension    Osteoarthritis    Prediabetes    RA (rheumatoid arthritis) (HCC)    Rotator cuff tear    right shoulder    Syncope      PAST SURGICAL HISTORY:  Past Surgical History:  Procedure Laterality Date   ANGIOPLASTY     BACK SURGERY     COLONOSCOPY  07/2010   tubular adenoma   COLONOSCOPY  08/2015   DISKECTOMY     laparoscopy for endometriosis     left shoulder scope     ROTATOR CUFF REPAIR     Spinal Fusion  12/2016   L4-S1    OB/GYN HISTORY:  OB History  Gravida Para Term Preterm AB Living  3 1          SAB IAB Ectopic Multiple Live Births               #  Outcome Date GA Lbr Len/2nd Weight Sex Delivery Anes PTL Lv  3 Gravida           2 Gravida           1 Para             No LMP recorded. Patient is postmenopausal.  Age at menarche: 70 Age at menopause: The 10 Hx of HRT: Denies Hx of STDs: Yes, trichomonas Last pap: 07/2017 - NIML, HR HPV negative History of abnormal pap smears: Yes, underwent colposcopy in approximately age 22, endorses normal Pap smears after  SCREENING STUDIES:  Last mammogram: 07/2021  Last colonoscopy: 2021  MEDICATIONS: Outpatient Encounter Medications as of 04/18/2022  Medication Sig   albuterol (VENTOLIN HFA) 108 (90 Base) MCG/ACT inhaler Inhale 2 puffs into the lungs every 6 (six) hours as needed.   amphetamine-dextroamphetamine (ADDERALL) 20 MG tablet 1/2 tablet   baclofen (LIORESAL) 10 MG tablet Take 10 mg by mouth 3 (three) times daily as needed.   Cyanocobalamin (B-12 PO) Take 1 tablet by mouth daily.   diclofenac Sodium (VOLTAREN) 1 % GEL Apply 1 application topically in the morning, at noon, in the evening, and at bedtime.   Fluticasone-Salmeterol (ADVAIR) 250-50 MCG/DOSE AEPB Inhale 1 puff into the lungs daily as needed (breathing.).    irbesartan (AVAPRO) 300 MG tablet Take 300 mg by mouth daily.   LORazepam (ATIVAN) 0.5 MG tablet Take 0.5 mg by mouth every 8 (eight) hours.   Magnesium 500 MG TABS 1 tablet with a meal   meclizine (ANTIVERT) 12.5 MG tablet Take 6.25-12.5 mg by mouth 3 (three) times daily as needed.   mupirocin ointment (BACTROBAN) 2 % Apply topically 3 (three) times daily as needed.   naproxen (NAPROSYN) 500 MG tablet Take 500 mg by mouth 2 (two) times daily as needed for pain.   omeprazole (PRILOSEC) 20 MG capsule omeprazole 20 mg capsule,delayed release  TAKE 1 CAPSULE BY MOUTH EVERY DAY   Oxycodone HCl 20 MG TABS Take 20 mg by mouth daily.   XTAMPZA ER 9 MG C12A Take 1 capsule by mouth at bedtime.   [DISCONTINUED] amLODipine (NORVASC) 10 MG tablet Take 10 mg by mouth daily.     [DISCONTINUED] hydroxychloroquine (PLAQUENIL) 200 MG tablet Take 200 mg by mouth 2 (two) times daily.   [DISCONTINUED] oxybutynin (DITROPAN-XL) 10 MG 24 hr tablet 1/2 tablet   No facility-administered encounter medications on file as of 04/18/2022.    ALLERGIES:  Allergies  Allergen Reactions   Cymbalta [Duloxetine Hcl] Shortness  Of Breath and Swelling    Tongue and leg swelling   Cyclobenzaprine Hcl Other (See Comments)    immobility   Celexa  [Citalopram Hydrobromide]     Tongue swelling   Flexeril [Cyclobenzaprine]    Fluticasone Furoate-Vilanterol Swelling and Other (See Comments)   Gabapentin     Feels awful when taking    Opana  [Oxymorphone Hcl]     Itching, hairloss   Tramadol     dizziness     FAMILY HISTORY:  Family History  Problem Relation Age of Onset   Heart disease Mother        Died with MI 23, chest pain 41s   Hypertension Mother    Pancreatitis Father    Atrial fibrillation Sister    Breast cancer Maternal Aunt    Multiple sclerosis Cousin      SOCIAL HISTORY:  Social Connections: Not on file    REVIEW OF SYSTEMS:  + Abdominal pain, pelvic pain, joint pain, back pain, anxiety, decreased concentration. Denies appetite changes, fevers, chills, fatigue, unexplained weight changes. Denies hearing loss, neck lumps or masses, mouth sores, ringing in ears or voice changes. Denies cough or wheezing.  Denies shortness of breath. Denies chest pain or palpitations. Denies leg swelling. Denies blood in stools, nausea, vomiting, or early satiety. Denies pain with intercourse, dysuria, frequency, hematuria or incontinence. Denies hot flashes, vaginal bleeding or vaginal discharge.   Denies muscle pain/cramps. Denies itching, rash, or wounds. Denies dizziness, headaches, numbness or seizures. Denies swollen lymph nodes or glands, denies easy bruising or bleeding. Denies depression, confusion.  Physical Exam:  Vital Signs for this encounter:  Blood pressure  (!) 149/86, pulse 95, temperature 98 F (36.7 C), temperature source Oral, resp. rate 16, height 5' 8.58" (1.742 m), weight 176 lb (79.8 kg), SpO2 100 %. Body mass index is 26.31 kg/m. General: Alert, oriented, no acute distress.  HEENT: Normocephalic, atraumatic. Sclera anicteric.  Chest: Clear to auscultation bilaterally. No wheezes, rhonchi, or rales. Cardiovascular: Regular rate and rhythm, no murmurs, rubs, or gallops.  Abdomen: Normoactive bowel sounds. Soft, nondistended, moderately tender to suprapubic palpation. No masses or hepatosplenomegaly appreciated. No palpable fluid wave.  Extremities: Grossly normal range of motion. Warm, well perfused. No edema bilaterally.  Skin: No rashes or lesions.  Lymphatics: No cervical, supraclavicular, or inguinal adenopathy.  GU:  Normal external female genitalia. No lesions. No discharge or bleeding.             Bladder/urethra:  No lesions or masses, well supported bladder             Vagina: Mildly atrophic, no lesions.             Cervix: Normal appearing, no lesions.  Atrophic.             Uterus/adnexa: Uterus moves minimally.  There is what feels to be a fibroid posteriorly versus fixed pelvic mass, better appreciated on rectal exam.  Patient is quite uncomfortable during this exam.  Rectal: Confirms above findings.  Rectum soft does not feel tethered to the mass posterior to the uterus although does not move much.  LABORATORY AND RADIOLOGIC DATA:  Outside medical records were reviewed to synthesize the above history, along with the history and physical obtained during the visit.   Lab Results  Component Value Date   WBC 4.6 08/20/2019   HGB 15.1 (H) 08/20/2019   HCT 44.6 08/20/2019   PLT 283 08/20/2019   GLUCOSE 96 04/18/2022   ALT 22  08/12/2014   AST 20 08/12/2014   NA 141 04/18/2022   K 4.1 04/18/2022   CL 105 04/18/2022   CREATININE 0.85 04/18/2022   BUN 22 04/18/2022   CO2 28 04/18/2022   INR 1.0 ratio 10/23/2008

## 2022-04-18 NOTE — Patient Instructions (Signed)
It was very nice to meet you today.  Based on what was seen on your recent CT scan, we discussed the possibility that findings represent a gynecologic cancer (potentially arising from the fallopian tube or ovary) that has spread or metastasized to other areas in your abdomen and pelvis.  To help form a treatment plan, we will get tumor markers or blood test today.  We are also getting you scheduled for a CT scan with contrast.  Depending on whether this appears to be a gynecologic cancer and the extent of spread, we will either discuss starting with chemotherapy or surgery first.  If we are moving forward with chemotherapy first, then we will pursue a biopsy of one of the areas concerning for cancer spread to help [place] get a diagnosis.

## 2022-04-18 NOTE — Telephone Encounter (Signed)
Called Dr. Sundra Aland office and requested patient's records.  They will fax them to [phone].

## 2022-04-19 DIAGNOSIS — C563 Malignant neoplasm of bilateral ovaries: Secondary | ICD-10-CM | POA: Insufficient documentation

## 2022-04-19 DIAGNOSIS — C5700 Malignant neoplasm of unspecified fallopian tube: Secondary | ICD-10-CM | POA: Insufficient documentation

## 2022-04-19 DIAGNOSIS — C800 Disseminated malignant neoplasm, unspecified: Secondary | ICD-10-CM | POA: Insufficient documentation

## 2022-04-19 DIAGNOSIS — N838 Other noninflammatory disorders of ovary, fallopian tube and broad ligament: Secondary | ICD-10-CM | POA: Insufficient documentation

## 2022-04-19 DIAGNOSIS — G35 Multiple sclerosis: Secondary | ICD-10-CM | POA: Insufficient documentation

## 2022-04-19 LAB — CA 125: Cancer Antigen (CA) 125: 123 U/mL — ABNORMAL HIGH (ref 0.0–38.1)

## 2022-04-21 ENCOUNTER — Other Ambulatory Visit: Payer: Self-pay

## 2022-04-21 ENCOUNTER — Inpatient Hospital Stay (HOSPITAL_BASED_OUTPATIENT_CLINIC_OR_DEPARTMENT_OTHER): Payer: Medicare Other | Admitting: Hematology and Oncology

## 2022-04-21 ENCOUNTER — Encounter: Payer: Self-pay | Admitting: Hematology and Oncology

## 2022-04-21 VITALS — BP 150/92 | HR 86 | Temp 97.4°F | Resp 18 | Ht 68.58 in | Wt 185.2 lb

## 2022-04-21 DIAGNOSIS — Z87891 Personal history of nicotine dependence: Secondary | ICD-10-CM | POA: Diagnosis not present

## 2022-04-21 DIAGNOSIS — G35 Multiple sclerosis: Secondary | ICD-10-CM | POA: Diagnosis not present

## 2022-04-21 DIAGNOSIS — I10 Essential (primary) hypertension: Secondary | ICD-10-CM

## 2022-04-21 DIAGNOSIS — Z7951 Long term (current) use of inhaled steroids: Secondary | ICD-10-CM | POA: Diagnosis not present

## 2022-04-21 DIAGNOSIS — Z79899 Other long term (current) drug therapy: Secondary | ICD-10-CM | POA: Diagnosis not present

## 2022-04-21 DIAGNOSIS — M069 Rheumatoid arthritis, unspecified: Secondary | ICD-10-CM | POA: Diagnosis not present

## 2022-04-21 DIAGNOSIS — R6881 Early satiety: Secondary | ICD-10-CM | POA: Diagnosis not present

## 2022-04-21 DIAGNOSIS — R109 Unspecified abdominal pain: Secondary | ICD-10-CM | POA: Diagnosis not present

## 2022-04-21 DIAGNOSIS — M1990 Unspecified osteoarthritis, unspecified site: Secondary | ICD-10-CM | POA: Diagnosis not present

## 2022-04-21 DIAGNOSIS — C800 Disseminated malignant neoplasm, unspecified: Secondary | ICD-10-CM

## 2022-04-21 DIAGNOSIS — R110 Nausea: Secondary | ICD-10-CM | POA: Diagnosis not present

## 2022-04-21 DIAGNOSIS — R971 Elevated cancer antigen 125 [CA 125]: Secondary | ICD-10-CM

## 2022-04-21 DIAGNOSIS — F32A Depression, unspecified: Secondary | ICD-10-CM | POA: Diagnosis not present

## 2022-04-21 DIAGNOSIS — G8929 Other chronic pain: Secondary | ICD-10-CM | POA: Diagnosis not present

## 2022-04-21 DIAGNOSIS — C786 Secondary malignant neoplasm of retroperitoneum and peritoneum: Secondary | ICD-10-CM | POA: Diagnosis not present

## 2022-04-21 DIAGNOSIS — R140 Abdominal distension (gaseous): Secondary | ICD-10-CM | POA: Diagnosis not present

## 2022-04-21 DIAGNOSIS — Z79891 Long term (current) use of opiate analgesic: Secondary | ICD-10-CM | POA: Diagnosis not present

## 2022-04-21 DIAGNOSIS — L732 Hidradenitis suppurativa: Secondary | ICD-10-CM | POA: Diagnosis not present

## 2022-04-21 DIAGNOSIS — K219 Gastro-esophageal reflux disease without esophagitis: Secondary | ICD-10-CM | POA: Diagnosis not present

## 2022-04-21 DIAGNOSIS — J45909 Unspecified asthma, uncomplicated: Secondary | ICD-10-CM | POA: Diagnosis not present

## 2022-04-21 NOTE — Assessment & Plan Note (Signed)
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 Her tumor marker is elevated Further workup in progress Her preliminary cancer diagnosis is most consistent with either primary peritoneal carcinomatosis versus ovarian cancer If her repeat imaging study next week show minimum disease burden, she could be a candidate for primary surgery followed by adjuvant treatment However, if she have significant burden of disease on her repeat imaging study next week, we will prescribe neoadjuvant chemotherapy Regardless, she is going to need port placement for systemic treatment Today, we briefly discussed the risk, benefits, side effects of combination of carboplatin and paclitaxel She is in agreement with the plan of care

## 2022-04-21 NOTE — Assessment & Plan Note (Signed)
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

## 2022-04-21 NOTE — Progress Notes (Signed)
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 If her repeat imaging study next week show minimum disease burden, she could be a candidate for primary surgery followed by adjuvant treatment However, if she have significant burden of disease on her repeat imaging study next week, we will prescribe neoadjuvant chemotherapy Regardless, she is going to need port placement for systemic treatment Today, we briefly discussed the risk, benefits, side effects of combination of carboplatin and paclitaxel She is in agreement with the plan of care  Multiple sclerosis ([place]) She has weakness and mild 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    Answer:   [facility]    The total time spent in the appointment was 60 minutes encounter with patients including review of chart and various tests results, discussions about plan of care and coordination of care plan   All questions were answered. The patient knows to call the clinic with any problems, questions or concerns. No barriers to learning was detected.  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 female is here because of abnormal CT imaging The patient has background history of multiple sclerosis, diagnosed approximately 2 years ago She has weakness and peripheral neuropathy She started to notice abnormal abdominal distention, bloating, early satiety, incomplete bowel evacuation, intermittent nausea and pressure sensation to her bladder This has been going on for approximately 5 months Ultimately, she had imaging study performed approximately 10 days ago that came back abnormal and she was seen by GYN surgeon a few days ago with plan for further workup  I have reviewed her chart and materials related to her cancer extensively and collaborated history with the patient. Summary of oncologic history is as follows: Oncology History  Carcinomatosis ([place])  04/11/2022 Imaging   CT of the abdomen and pelvis on 04/11/2022 reveals a left adnexal masslike area measuring 5.9 x 3.6 cm.  Margins of this mass are irregular.  There is small fluid in the cul-de-sac as well as fullness of soft tissue about the right adnexa.  Discrete uterine structure is not identified.  Left upper quadrant nodules beneath the left hemidiaphragm (1 anterior to the spleen and the other to the stomach).  Omental/peritoneal nodularity and multiple perihepatic nodules are noted    04/18/2022 Tumor Marker   Patient's tumor was tested for the following markers: CA-125. Results of the tumor marker test revealed 123.   04/19/2022 Initial Diagnosis   Carcinomatosis [facility])     MEDICAL HISTORY:  Past Medical History:  Diagnosis Date   Anxiety    Asthma    Back problem  disc disease, chronic low back pain, right leg pain, s/p discectomy 99   Depression    Diastolic dysfunction    with elevated LVEDP at cath   Endometriosis    Fibroids    hx of    GERD (gastroesophageal reflux disease)    Hepatitis C    treated 8-9 years ago    Hidradenitis suppurativa    Hypertension    Osteoarthritis    Prediabetes    RA (rheumatoid arthritis) (HCC)    Rotator cuff tear    right shoulder    Syncope     SURGICAL HISTORY: Past Surgical History:  Procedure Laterality Date   ANGIOPLASTY     BACK SURGERY     COLONOSCOPY  07/2010   tubular adenoma   COLONOSCOPY  08/2015   DISKECTOMY     laparoscopy for endometriosis     left shoulder scope     ROTATOR CUFF REPAIR     Spinal Fusion  12/2016   L4-S1    SOCIAL HISTORY: Social History   Socioeconomic History   Marital status: Divorced    Spouse name: Not on file   Number of children: 1   Years of education: Not on file   Highest education level: Not on file  Occupational History   Not on file  Tobacco Use   Smoking status: Former    Packs/day: 0.10    Types: Cigarettes   Smokeless tobacco: Never  Vaping Use   Vaping Use: Never used  Substance and Sexual Activity   Alcohol use: Yes    Comment: Occasional    Drug use: Yes    Types: Marijuana   Sexual activity: Yes  Other Topics Concern   Not on file  Social History Narrative   Lives alone   Right handed   Caffeine: none    Social Determinants of Health   Financial Resource Strain: Not on file  Food Insecurity: Not on file  Transportation Needs: Not on file  Physical Activity: Not on file  Stress: Not on file  Social Connections: Not on file  Intimate Partner Violence: Not on file    FAMILY HISTORY: Family History  Problem Relation Age of Onset   Heart disease Mother        Died with MI 62, chest pain 41s   Hypertension Mother    Pancreatitis Father    Atrial fibrillation Sister    Breast cancer Maternal Aunt    Multiple sclerosis Cousin     ALLERGIES:  is allergic to cymbalta [duloxetine hcl], cyclobenzaprine hcl, celexa  [citalopram hydrobromide], flexeril [cyclobenzaprine], fluticasone furoate-vilanterol, gabapentin, opana  [oxymorphone hcl], and tramadol.  MEDICATIONS:  Current  Outpatient Medications  Medication Sig Dispense Refill   albuterol (VENTOLIN HFA) 108 (90 Base) MCG/ACT inhaler Inhale 2 puffs into the lungs every 6 (six) hours as needed.     amphetamine-dextroamphetamine (ADDERALL) 20 MG tablet 1/2 tablet     baclofen (LIORESAL) 10 MG tablet Take 10 mg by mouth 3 (three) times daily as needed.     Cyanocobalamin (B-12 PO) Take 1 tablet by mouth daily.     diclofenac Sodium (VOLTAREN) 1 % GEL Apply 1 application topically in the morning, at noon, in the evening, and at bedtime.     Fluticasone-Salmeterol (ADVAIR) 250-50 MCG/DOSE AEPB Inhale 1 puff into the lungs daily as needed (breathing.).      irbesartan (AVAPRO) 300 MG tablet Take 300 mg by mouth daily.  3   LORazepam (ATIVAN) 0.5 MG tablet  Take 0.5 mg by mouth every 8 (eight) hours.     Magnesium 500 MG TABS 1 tablet with a meal     meclizine (ANTIVERT) 12.5 MG tablet Take 6.25-12.5 mg by mouth 3 (three) times daily as needed.     mupirocin ointment (BACTROBAN) 2 % Apply topically 3 (three) times daily as needed.     naproxen (NAPROSYN) 500 MG tablet Take 500 mg by mouth 2 (two) times daily as needed for pain.     omeprazole (PRILOSEC) 20 MG capsule omeprazole 20 mg capsule,delayed release  TAKE 1 CAPSULE BY MOUTH EVERY DAY     Oxycodone HCl 20 MG TABS Take 20 mg by mouth daily.     XTAMPZA ER 9 MG C12A Take 1 capsule by mouth at bedtime.     No current facility-administered medications for this visit.    REVIEW OF SYSTEMS:   Constitutional: Denies fevers, chills or abnormal night sweats Eyes: Denies blurriness of vision, double vision or watery eyes Ears, nose, mouth, throat, and face: Denies mucositis or sore throat Respiratory: Denies cough, dyspnea or wheezes Cardiovascular: Denies palpitation, chest discomfort or lower extremity swelling Skin: Denies abnormal skin rashes Lymphatics: Denies new lymphadenopathy or easy bruising Behavioral/Psych: Mood is stable, no new changes  All other  systems were reviewed with the patient and are negative.  PHYSICAL EXAMINATION: ECOG PERFORMANCE STATUS: 1 - Symptomatic but completely ambulatory  Vitals:   04/21/22 1305  BP: (!) 150/92  Pulse: 86  Resp: 18  Temp: (!) 97.4 F (36.3 C)  SpO2: 100%   Filed Weights   04/21/22 1305  Weight: 185 lb 3.2 oz (84 kg)    GENERAL:alert, no distress and comfortable SKIN: skin color, texture, turgor are normal, no rashes or significant lesions EYES: normal, conjunctiva are pink and non-injected, sclera clear OROPHARYNX:no exudate, no erythema and lips, buccal mucosa, and tongue normal  NECK: supple, thyroid normal size, non-tender, without nodularity LYMPH:  no palpable lymphadenopathy in the cervical, axillary or inguinal LUNGS: clear to auscultation and percussion with normal breathing effort HEART: regular rate & rhythm and no murmurs and no lower extremity edema ABDOMEN:abdomen soft, non-tender and normal bowel sounds Musculoskeletal:no cyanosis of digits and no clubbing  PSYCH: alert & oriented x 3 with fluent speech NEURO: no focal motor/sensory deficits  LABORATORY DATA:  I have reviewed the data as listed Lab Results  Component Value Date   WBC 4.6 08/20/2019   HGB 15.1 (H) 08/20/2019   HCT 44.6 08/20/2019   MCV 97.2 08/20/2019   PLT 283 08/20/2019   Recent Labs    04/18/22 1126  NA 141  K 4.1  CL 105  CO2 28  GLUCOSE 96  BUN 22  CREATININE 0.85  CALCIUM 10.1  GFRNONAA >60    RADIOGRAPHIC STUDIES: I have personally reviewed her outside imaging I have personally reviewed the radiological images as listed and agreed with the findings in the report.

## 2022-04-24 DIAGNOSIS — E279 Disorder of adrenal gland, unspecified: Secondary | ICD-10-CM | POA: Diagnosis not present

## 2022-04-26 ENCOUNTER — Ambulatory Visit (HOSPITAL_COMMUNITY)
Admission: RE | Admit: 2022-04-26 | Discharge: 2022-04-26 | Disposition: A | Discharge status: Home / Self Care | Payer: Medicare Other | Source: Ambulatory Visit | Attending: Gynecologic Oncology | Admitting: Gynecologic Oncology

## 2022-04-26 DIAGNOSIS — C800 Disseminated malignant neoplasm, unspecified: Secondary | ICD-10-CM | POA: Diagnosis not present

## 2022-04-26 DIAGNOSIS — N132 Hydronephrosis with renal and ureteral calculous obstruction: Secondary | ICD-10-CM | POA: Diagnosis not present

## 2022-04-26 DIAGNOSIS — C482 Malignant neoplasm of peritoneum, unspecified: Secondary | ICD-10-CM | POA: Diagnosis not present

## 2022-04-26 DIAGNOSIS — J9809 Other diseases of bronchus, not elsewhere classified: Secondary | ICD-10-CM | POA: Diagnosis not present

## 2022-04-26 MED ORDER — IOHEXOL 9 MG/ML PO SOLN: 1000.0000 mL | Freq: Once | ORAL | Status: DC

## 2022-04-26 MED ORDER — IOHEXOL 300 MG/ML  SOLN
100.0000 mL | Freq: Once | INTRAMUSCULAR | Status: AC | PRN
  Administered 2022-04-26: 100 mL via INTRAVENOUS

## 2022-05-02 ENCOUNTER — Other Ambulatory Visit: Payer: Self-pay | Admitting: Gynecologic Oncology

## 2022-05-02 ENCOUNTER — Telehealth: Payer: Self-pay

## 2022-05-02 DIAGNOSIS — C800 Disseminated malignant neoplasm, unspecified: Secondary | ICD-10-CM

## 2022-05-02 NOTE — Progress Notes (Signed)
Spoke with patient -reviewed findings on CT scan of extensive carcinomatosis and peritoneal disease.  Discussed option of pursuing biopsy with interventional radiology versus diagnostic scope with biopsy.  I think the patient is unlikely to be a good candidate for upfront debulking surgery.  She would like to proceed with what ever is going to be faster.  I will put the order in for CT-guided biopsy and speak with my office tomorrow about possibility of diagnostic laparoscopy in the next 1-2 weeks.  Jeral Pinch MD Gynecologic Oncology

## 2022-05-02 NOTE — Telephone Encounter (Signed)
Pt calling office requesting Dr.Tucker call her back with CT results from 12/20.

## 2022-05-03 ENCOUNTER — Encounter: Payer: Self-pay | Admitting: General Practice

## 2022-05-03 NOTE — Progress Notes (Unsigned)
Arne Cleveland, MD  Allen Kell, NT Ok  CT core peritoneal met E.g. CT 04/26/22 Im 41 Se2  [facility]       Previous Messages    ----- Message ----- From: Allen Kell, NT Sent: 05/02/2022   4:38 PM EST To: Ir Procedure Requests Subject: CT Biopsy                                      Procedure: CT Biopsy  Reason: metastatic gyn cancer  History: CT in chart  Provider: Lafonda Mosses, MD  Contact: [phone]

## 2022-05-05 ENCOUNTER — Other Ambulatory Visit: Payer: Self-pay | Admitting: Student

## 2022-05-05 DIAGNOSIS — C800 Disseminated malignant neoplasm, unspecified: Secondary | ICD-10-CM

## 2022-05-09 ENCOUNTER — Encounter (HOSPITAL_COMMUNITY): Payer: Self-pay

## 2022-05-09 ENCOUNTER — Ambulatory Visit (HOSPITAL_COMMUNITY)
Admission: RE | Admit: 2022-05-09 | Discharge: 2022-05-09 | Disposition: A | Discharge status: Home / Self Care | Payer: 59 | Source: Ambulatory Visit | Attending: Gynecologic Oncology | Admitting: Gynecologic Oncology

## 2022-05-09 ENCOUNTER — Other Ambulatory Visit: Payer: Self-pay | Admitting: Radiology

## 2022-05-09 DIAGNOSIS — Z538 Procedure and treatment not carried out for other reasons: Secondary | ICD-10-CM | POA: Insufficient documentation

## 2022-05-09 DIAGNOSIS — G35 Multiple sclerosis: Secondary | ICD-10-CM | POA: Insufficient documentation

## 2022-05-09 DIAGNOSIS — K669 Disorder of peritoneum, unspecified: Secondary | ICD-10-CM | POA: Insufficient documentation

## 2022-05-09 DIAGNOSIS — C800 Disseminated malignant neoplasm, unspecified: Secondary | ICD-10-CM | POA: Diagnosis not present

## 2022-05-09 DIAGNOSIS — K668 Other specified disorders of peritoneum: Secondary | ICD-10-CM | POA: Diagnosis not present

## 2022-05-09 DIAGNOSIS — Z87891 Personal history of nicotine dependence: Secondary | ICD-10-CM | POA: Insufficient documentation

## 2022-05-09 DIAGNOSIS — Z9189 Other specified personal risk factors, not elsewhere classified: Secondary | ICD-10-CM | POA: Diagnosis not present

## 2022-05-09 LAB — CBC
HCT: 43.3 % (ref 36.0–46.0)
Hemoglobin: 15 g/dL (ref 12.0–15.0)
MCH: 32.3 pg (ref 26.0–34.0)
MCHC: 34.6 g/dL (ref 30.0–36.0)
MCV: 93.3 fL (ref 80.0–100.0)
Platelets: 323 10*3/uL (ref 150–400)
RBC: 4.64 MIL/uL (ref 3.87–5.11)
RDW: 11.6 % (ref 11.5–15.5)
WBC: 3 10*3/uL — ABNORMAL LOW (ref 4.0–10.5)
nRBC: 0 % (ref 0.0–0.2)

## 2022-05-09 LAB — PROTIME-INR
INR: 0.9 (ref 0.8–1.2)
Prothrombin Time: 12.4 seconds (ref 11.4–15.2)

## 2022-05-09 MED ORDER — SODIUM CHLORIDE 0.9 % IV SOLN: INTRAVENOUS | Status: DC

## 2022-05-09 MED ORDER — LIDOCAINE HCL (PF) 1 % IJ SOLN: 30.0000 mL | Freq: Once | INTRAMUSCULAR | Status: DC

## 2022-05-09 MED ORDER — FENTANYL CITRATE (PF) 100 MCG/2ML IJ SOLN
INTRAMUSCULAR | Status: AC | PRN
  Administered 2022-05-09: 25 ug via INTRAVENOUS
  Administered 2022-05-09: 50 ug via INTRAVENOUS
  Administered 2022-05-09 (×2): 25 ug via INTRAVENOUS

## 2022-05-09 MED ORDER — MIDAZOLAM HCL 2 MG/2ML IJ SOLN
INTRAMUSCULAR | Status: AC
  Filled 2022-05-09: qty 4

## 2022-05-09 MED ORDER — DIPHENHYDRAMINE HCL 50 MG/ML IJ SOLN
INTRAMUSCULAR | Status: AC
  Filled 2022-05-09: qty 1

## 2022-05-09 MED ORDER — FENTANYL CITRATE (PF) 100 MCG/2ML IJ SOLN
INTRAMUSCULAR | Status: AC
  Filled 2022-05-09: qty 4

## 2022-05-09 MED ORDER — MIDAZOLAM HCL 2 MG/2ML IJ SOLN
INTRAMUSCULAR | Status: AC | PRN
  Administered 2022-05-09: .5 mg via INTRAVENOUS
  Administered 2022-05-09: 1 mg via INTRAVENOUS
  Administered 2022-05-09 (×2): .5 mg via INTRAVENOUS

## 2022-05-09 NOTE — Procedures (Signed)
Interventional Radiology Procedure:   Indications: Peritoneal disease   Procedure: Attempted image guided peritoneal nodule  Findings: Small omental / peritoneal nodules.  Targeted small nodule adjacent to liver, could see with both [place] and CT.   Patient could not tolerate procedure and could not perform biopsy.  Complications: No immediate complications noted.     EBL: Minimal  Plan: Discharge to home.     Ashely Joshua R. Anselm Pancoast, MD  Pager: [phone]

## 2022-05-09 NOTE — H&P (Signed)
Chief Complaint: Patient was seen in consultation today for carcinomatosis; peritoneal lymphadenopathy biopsy at the request of Tucker,Katherine R  Referring Physician(s): Tucker,Katherine R  Supervising Physician: Markus Daft  Patient Status: [facility] - Out-pt  History of Present Illness: [name] is a 67 y.o. female   Hx Multiple Sclerosis Hx neurogenic bladder Primary peritoneal carcinomatosis Vs ovarian cancer Abd pain Tumor marker elevation (123)  CT 04/26/22: IMPRESSION: 1. Bulky bilateral ovarian masses and nodules, left-greater-than-right, which are essentially confluent with adjacent pelvic soft tissue nodularity and not significantly changed compared to prior examination dated 04/11/2022. 2. Extensive pelvic peritoneal thickening and nodularity. Multiple small peritoneal nodules throughout the abdomen and pelvis. Findings are consistent with peritoneal metastatic disease and likewise not significantly changed. 3. Small volume of loculated appearing fluid in the low pelvis. 4. 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 The mid to distal right ureter is obstructed by right ovarian mass and or soft tissue nodularity. 5. Calculus within the most distal left ureterovesicular junction or just within the bladder lumen measuring 0.7 cm, unchanged. No associated left-sided hydronephrosis. 6. Soft tissue attenuation nodule of the body of the right adrenal gland. Notably, this was also soft tissue attenuation on prior noncontrast examination (i.e. not definitively macroscopic fat containing) although unchanged compared to prior examinations dating back to 2703 and almost certainly a benign adenoma. Attention on follow-up. 7. No evidence of metastatic disease in the chest. 8. Mild diffuse bilateral bronchial wall thickening. Background of very fine centrilobular  nodularity, most concentrated in the lung apices. Findings are most  consistent with smoking-related respiratory bronchiolitis.   Follows with Dr Raliegh Ip Tucker/Dr Natale Lay Dr Berline Lopes note 05/02/22:   -reviewed findings on CT scan of extensive carcinomatosis and peritoneal disease.  Discussed option of pursuing biopsy with interventional radiology versus diagnostic scope with biopsy.  I think the patient is unlikely to be a good candidate for upfront debulking surgery.  Requesting peritoneal mass biopsy Also for PAC placement 05/12/22   Past Medical History:  Diagnosis Date   Anxiety    Asthma    Back problem    disc disease, chronic low back pain, right leg pain, s/p discectomy 99   Depression    Diastolic dysfunction    with elevated LVEDP at cath   Endometriosis    Fibroids    hx of    GERD (gastroesophageal reflux disease)    Hepatitis C    treated 8-9 years ago   Hidradenitis suppurativa    Hypertension    Osteoarthritis    Prediabetes    RA (rheumatoid arthritis) ([place])    Rotator cuff tear    right shoulder    Syncope     Past Surgical History:  Procedure Laterality Date   ANGIOPLASTY     BACK SURGERY     COLONOSCOPY  07/2010   tubular adenoma   COLONOSCOPY  08/2015   DISKECTOMY     laparoscopy for endometriosis     left shoulder scope     ROTATOR CUFF REPAIR     Spinal Fusion  12/2016   L4-S1    Allergies: Cymbalta [duloxetine hcl], Cyclobenzaprine hcl, Celexa  [citalopram hydrobromide], Fluticasone furoate-vilanterol, Gabapentin, Opana  [oxymorphone hcl], and Tramadol  Medications: Prior to Admission medications   Medication Sig Start Date End Date Taking? Authorizing Provider  albuterol (VENTOLIN HFA) 108 (90 Base) MCG/ACT inhaler Inhale 2 puffs into the lungs every 6 (  six) hours as needed for shortness of breath. 07/30/21  Yes [provider]  amphetamine-dextroamphetamine (ADDERALL) 20 MG tablet Take 30 mg by mouth daily.   Yes [provider]  Ascorbic Acid (VITAMIN C) 1000 MG tablet Take 1,000 mg by mouth 3 (three) times a week.   Yes [provider]  ASHWAGANDHA PO Take 1 tablet by mouth daily.   Yes [provider]  BLACK CURRANT SEED OIL PO Take 1 tablet by mouth 2 (two) times daily.   Yes [provider]  cyclobenzaprine (FLEXERIL) 5 MG tablet Take 5 mg by mouth at bedtime as needed for muscle spasms.   Yes [provider]  diclofenac Sodium (VOLTAREN) 1 % GEL Apply 2 g topically 4 (four) times daily. 08/12/19  Yes [provider]  Fluticasone-Salmeterol (ADVAIR) 250-50 MCG/DOSE AEPB Inhale 1 puff into the lungs daily as needed (breathing.).    Yes [provider]  Garlic 3267 MG CAPS Take 1,000 mg by mouth once a week.   Yes [provider]  hydroxychloroquine (PLAQUENIL) 200 MG tablet Take 200 mg by mouth daily.   Yes [provider]  irbesartan (AVAPRO) 300 MG tablet Take 300 mg by mouth daily. 09/18/17  Yes [provider]  LORazepam (ATIVAN) 0.5 MG tablet Take 0.5 mg by mouth 2 (two) times daily as needed for anxiety.   Yes [provider]  meclizine (ANTIVERT) 12.5 MG tablet Take 12.5 mg by mouth 3 (three) times daily as needed for dizziness. 08/02/21  Yes [provider]  Melatonin 10 MG TABS Take 10 mg by mouth at bedtime as needed (sleep).   Yes [provider]  Multiple Vitamin (MULTIVITAMIN WITH MINERALS) TABS tablet Take 1 tablet by mouth daily.   Yes [provider]  Multiple Vitamins-Minerals (ZINC PO) Take 1 tablet by mouth daily.   Yes [provider]  mupirocin ointment (BACTROBAN) 2 % Apply 1 Application topically 3 (three) times daily as needed (boils). 08/02/21  Yes [provider]  naloxone (NARCAN) nasal spray 4 mg/0.1 mL Place 1 spray into the nose as needed (opioid overdose).   Yes [provider]  naproxen (NAPROSYN) 500 MG tablet Take 500 mg by mouth 2  (two) times daily as needed for pain. 07/16/19  Yes [provider]  omeprazole (PRILOSEC) 20 MG capsule Take 20 mg by mouth daily as needed (acid reflux).   Yes [provider]  OVER THE COUNTER MEDICATION Take 1 tablet by mouth every 30 (thirty) days. Blood sugar focus supplement   Yes [provider]  OVER THE COUNTER MEDICATION Take 1 tablet by mouth 2 (two) times daily. Sea moss supplement   Yes [provider]  OVER THE COUNTER MEDICATION Take 2 tablets by mouth at bedtime. Liver focus otc supplement   Yes [provider]  Oxycodone HCl 20 MG TABS Take 20 mg by mouth 4 (four) times daily as needed (pain).   Yes [provider]  polyethylene glycol (MIRALAX / GLYCOLAX) 17 g packet Take 17 g by mouth daily as needed for moderate constipation.   Yes [provider]  TURMERIC CURCUMIN PO Take 1,000 mg by mouth daily.   Yes [provider]  Vitamin D, Ergocalciferol, (DRISDOL) 1.25 MG (50000 UNIT) CAPS capsule Take 50,000 Units by mouth every Sunday.   Yes [provider]  XTAMPZA ER 9 MG C12A Take 9 mg by mouth 2 (two) times daily. 07/12/21  Yes [provider]  Family History  Problem Relation Age of Onset   Heart disease Mother        Died with MI 65, chest pain 68s   Hypertension Mother    Pancreatitis Father    Atrial fibrillation Sister    Breast cancer Maternal Aunt    Multiple sclerosis Cousin     Social History   Socioeconomic History   Marital status: Divorced    Spouse name: Not on file   Number of children: 1   Years of education: Not on file   Highest education level: Not on file  Occupational History   Not on file  Tobacco Use   Smoking status: Former    Packs/day: 0.10    Types: Cigarettes   Smokeless tobacco: Never  Vaping Use   Vaping Use: Never used  Substance and Sexual Activity   Alcohol use: Yes    Comment: Occasional    Drug use: Yes    Types: Marijuana    Sexual activity: Yes  Other Topics Concern   Not on file  Social History Narrative   Lives alone   Right handed   Caffeine: none    Social Determinants of Health   Financial Resource Strain: Not on file  Food Insecurity: Not on file  Transportation Needs: Not on file  Physical Activity: Not on file  Stress: Not on file  Social Connections: Not on file    Review of Systems: A 12 point ROS discussed and pertinent positives are indicated in the HPI above.  All other systems are negative.  Review of Systems  Constitutional:  Positive for activity change and fatigue. Negative for fever.  Respiratory:  Negative for cough and shortness of breath.   Cardiovascular:  Negative for chest pain.  Gastrointestinal:  Positive for abdominal distention and abdominal pain. Negative for nausea.  Psychiatric/Behavioral:  Negative for behavioral problems and confusion.     Vital Signs: BP (!) 124/90   Pulse 84   Temp (!) 97.5 F (36.4 C) (Temporal)   Resp 17   Ht '5\' 9"'$  (1.753 m)   Wt 175 lb (79.4 kg)   SpO2 98%   BMI 25.84 kg/m     Physical Exam Vitals reviewed.  HENT:     Mouth/Throat:     Mouth: Mucous membranes are moist.  Cardiovascular:     Rate and Rhythm: Normal rate and regular rhythm.     Heart sounds: Normal heart sounds.  Pulmonary:     Effort: Pulmonary effort is normal.     Breath sounds: Normal breath sounds.  Abdominal:     Palpations: Abdomen is soft.     Tenderness: There is abdominal tenderness.  Musculoskeletal:        General: Normal range of motion.  Skin:    General: Skin is warm.  Neurological:     Mental Status: She is alert and oriented to person, place, and time.  Psychiatric:        Behavior: Behavior normal.     Imaging: CT CHEST ABDOMEN PELVIS W CONTRAST  Result Date: 04/30/2022 CLINICAL DATA:  Primary peritoneal carcinomatosis versus ovarian cancer, staging, * Tracking Code: BO * EXAM: CT CHEST, ABDOMEN, AND PELVIS WITH CONTRAST  TECHNIQUE: Multidetector CT imaging of the chest, abdomen and pelvis was performed following the standard protocol during bolus administration of intravenous contrast. RADIATION DOSE REDUCTION: This exam was performed according to the departmental dose-optimization program which includes automated exposure control, adjustment of the mA and/or kV according to patient size  and/or use of iterative reconstruction technique. CONTRAST:  148m OMNIPAQUE IOHEXOL 300 MG/ML SOLN additional oral enteric contrast COMPARISON:  04/11/2022 FINDINGS: CT CHEST FINDINGS Cardiovascular: Aortic atherosclerosis. Normal heart size. No pericardial effusion. Mediastinum/Nodes: No enlarged mediastinal, hilar, or axillary lymph nodes. Thyroid gland, trachea, and esophagus demonstrate no significant findings. Lungs/Pleura: Mild diffuse bilateral bronchial wall thickening. Background of very fine centrilobular nodularity, most concentrated in the lung apices. No pleural effusion or pneumothorax. Musculoskeletal: No chest wall abnormality. No acute osseous findings. CT ABDOMEN PELVIS FINDINGS Hepatobiliary: No solid liver abnormality is seen. No gallstones, gallbladder wall thickening, or biliary dilatation. Pancreas: Unremarkable. No pancreatic ductal dilatation or surrounding inflammatory changes. Spleen: Normal in size without significant abnormality. Adrenals/Urinary Tract: Soft tissue attenuation nodule of the body of the right adrenal gland measuring 2.5 x 1.8 cm (series 2, image 59). Notably, this was also soft tissue attenuation on prior noncontrast examination although unchanged compared to prior examinations dating back to 28841and almost certainly a benign adenoma. 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 The mid to distal right ureter is obstructed by right ovarian mass and or soft tissue nodularity. Left kidney is normal, without  renal calculi, solid lesion, or hydronephrosis. Calculus within the most distal left ureterovesicular junction or just within the bladder lumen measuring 0.7 cm (series 2, image 110). Stomach/Bowel: Stomach is within normal limits. Appendix appears normal. The distal sigmoid colon is involved by and indistinguishable from closely adjacent pelvic masses and nodularity (series 2, image 100). No evidence of bowel obstruction. Occasional sigmoid diverticula. Vascular/Lymphatic: No significant vascular findings are present. No enlarged abdominal or pelvic lymph nodes. Reproductive: Bulky bilateral ovarian masses and nodules, which are essentially confluence with adjacent pelvic soft tissue nodularity largest discrete component in the vicinity of the left ovary measuring 6.9 x 3.8 cm (series 2, image 100), largest discrete component in the vicinity of the right ovary measuring 5.2 x 3.1 cm (series 2, image 99). Other: No abdominal wall hernia or abnormality. Small volume of loculated appearing fluid low pelvis (series 2, image 105). Extensive pelvic peritoneal thickening and nodularity index nodule in the posterior pelvis measuring 2.7 x 2.1 cm (series 2, image 106). Multiple small peritoneal nodules throughout the abdomen and pelvis, for example anterior to the liver measuring 0.8 x 0.5 cm (series 2, image 65) the ventral left hemiabdomen measuring 0.8 x 0.6 cm (series 2, image 63). Musculoskeletal: No acute osseous findings. IMPRESSION: 1. Bulky bilateral ovarian masses and nodules, left-greater-than-right, which are essentially confluent with adjacent pelvic soft tissue nodularity and not significantly changed compared to prior examination dated 04/11/2022. 2. Extensive pelvic peritoneal thickening and nodularity. Multiple small peritoneal nodules throughout the abdomen and pelvis. Findings are consistent with peritoneal metastatic disease and likewise not significantly changed. 3. Small volume of loculated appearing  fluid in the low pelvis. 4. 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 The mid to distal right ureter is obstructed by right ovarian mass and or soft tissue nodularity. 5. Calculus within the most distal left ureterovesicular junction or just within the bladder lumen measuring 0.7 cm, unchanged. No associated left-sided hydronephrosis. 6. Soft tissue attenuation nodule of the body of the right adrenal gland. Notably, this was also soft tissue attenuation on prior noncontrast examination (i.e. not definitively macroscopic fat containing) although unchanged compared to prior examinations dating back to 26606and almost certainly  a benign adenoma. Attention on follow-up. 7. No evidence of metastatic disease in the chest. 8. Mild diffuse bilateral bronchial wall thickening. Background of very fine centrilobular nodularity, most concentrated in the lung apices. Findings are most consistent with smoking-related respiratory bronchiolitis. Aortic Atherosclerosis (ICD10-I70.0). Electronically Signed   By: Delanna Ahmadi M.D.   On: 04/30/2022 14:37    Labs:  CBC: No results for input(s): "WBC", "HGB", "HCT", "PLT" in the last 8760 hours.  COAGS: No results for input(s): "INR", "APTT" in the last 8760 hours.  BMP: Recent Labs    04/18/22 1126  NA 141  K 4.1  CL 105  CO2 28  GLUCOSE 96  BUN 22  CALCIUM 10.1  CREATININE 0.85  GFRNONAA >60    LIVER FUNCTION TESTS: No results for input(s): "BILITOT", "AST", "ALT", "ALKPHOS", "PROT", "ALBUMIN" in the last 8760 hours.  TUMOR MARKERS: Recent Labs    04/18/22 1126  CEA 1.69    Assessment and Plan:  Pt is scheduled for peritoneal mass biopsy Risks and benefits of peritoneal mass biopsy was discussed with the patient and/or patient's family including, but not limited to bleeding, infection, damage to adjacent structures or low yield requiring  additional tests.  All of the questions were answered and there is agreement to proceed.  Consent signed and in chart.  Thank you for this interesting consult.  I greatly enjoyed meeting CAIDYNCE MUZYKA and look forward to participating in their care.  A copy of this report was sent to the requesting provider on this date.  Electronically Signed: Lavonia Drafts, PA-C 05/09/2022, 9:14 AM   I spent a total of  30 Minutes   in face to face in clinical consultation, greater than 50% of which was counseling/coordinating care for peritoneal mass biopsy

## 2022-05-10 ENCOUNTER — Other Ambulatory Visit: Payer: Self-pay | Admitting: Hematology and Oncology

## 2022-05-10 DIAGNOSIS — C800 Disseminated malignant neoplasm, unspecified: Secondary | ICD-10-CM

## 2022-05-10 DIAGNOSIS — C762 Malignant neoplasm of abdomen: Secondary | ICD-10-CM | POA: Diagnosis not present

## 2022-05-12 ENCOUNTER — Other Ambulatory Visit (HOSPITAL_COMMUNITY): Payer: Medicare Other

## 2022-05-12 ENCOUNTER — Ambulatory Visit (HOSPITAL_COMMUNITY): Payer: Medicare Other

## 2022-05-14 NOTE — Patient Instructions (Signed)
SURGICAL WAITING ROOM VISITATION Patients having surgery or a procedure may have no more than 2 support people in the waiting area - these visitors may rotate in the visitor waiting room.   Due to an increase in RSV and influenza rates and associated hospitalizations, children ages 65 and under may not visit patients in [place]. If the patient needs to stay at the hospital during part of their recovery, the visitor guidelines for inpatient rooms apply.  PRE-OP VISITATION  Pre-op nurse will coordinate an appropriate time for 1 support person to accompany the patient in pre-op.  This support person may not rotate.  This visitor will be contacted when the time is appropriate for the visitor to come back in the pre-op area.  Please refer to the [facility] website for the visitor guidelines for Inpatients (after your surgery is over and you are in a regular room).  You are not required to quarantine at this time prior to your surgery. However, you must do this: Hand Hygiene often Do NOT share personal items Notify your provider if you are in close contact with someone who has COVID or you develop fever 100.4 or greater, new onset of sneezing, cough, sore throat, shortness of breath or body aches.  If you test positive for Covid or have been in contact with anyone that has tested positive in the last 10 days please notify you surgeon.    Your procedure is scheduled on:  Thursday  May 18, 2022  Report to [facility] Main Entrance: [place] entrance where the [employer] is available.   Report to admitting at: 06:30 AM  per Interventional Radiology   +++++Call this number if you have any questions or problems the morning of surgery [phone]  DO NOT EAT OR DRINK ANYTHING AFTER MIDNIGHT THE NIGHT PRIOR TO YOUR SURGERY / PROCEDURE.   FOLLOW  ANY ADDITIONAL PRE OP INSTRUCTIONS YOU RECEIVED FROM YOUR SURGEON'S OFFICE!!!   Oral Hygiene is also important to reduce your  risk of infection.        Remember - BRUSH YOUR TEETH THE MORNING OF SURGERY WITH YOUR REGULAR TOOTHPASTE  Do NOT smoke after Midnight the night before surgery.  Take ONLY these medicines the morning of surgery with A SIP OF WATER: Xtampza, ???                   You may not have any metal on your body including hair pins, jewelry, and body piercing  Do not wear make-up, lotions, powders, perfumes or deodorant  Do not wear nail polish including gel and S&S, artificial / acrylic nails, or any other type of covering on natural nails including finger and toenails. If you have artificial nails, gel coating, etc., that needs to be removed by a nail salon, Please have this removed prior to surgery. Not doing so may mean that your surgery could be cancelled or delayed if the Surgeon or anesthesia staff feels like they are unable to monitor you safely.   Do not shave 48 hours prior to surgery to avoid nicks in your skin which may contribute to postoperative infections.   Contacts, Hearing Aids, dentures or bridgework may not be worn into surgery. DENTURES WILL BE REMOVED PRIOR TO SURGERY PLEASE DO NOT APPLY "Poly grip" OR ADHESIVES!!!  Patients discharged on the day of surgery will not be allowed to drive home.  Someone NEEDS to stay with you for the first 24 hours after anesthesia.  Do not  bring your home medications to the hospital. The Pharmacy will dispense medications listed on your medication list to you during your admission in the Hospital.  Special Instructions: Bring a copy of your healthcare power of attorney and living will documents the day of surgery, if you wish to have them scanned into your [facility] [facility] Records- EPIC  Please read over the following fact sheets you were given: IF YOU HAVE QUESTIONS ABOUT YOUR PRE-OP INSTRUCTIONS, PLEASE CALL [phone]  ([place])   [place] - Preparing for Surgery Before surgery, you can play an important role.  Because skin is not sterile,  your skin needs to be as free of germs as possible.  You can reduce the number of germs on your skin by washing with CHG (chlorahexidine gluconate) soap before surgery.  CHG is an antiseptic cleaner which kills germs and bonds with the skin to continue killing germs even after washing. Please DO NOT use if you have an allergy to CHG or antibacterial soaps.  If your skin becomes reddened/irritated stop using the CHG and inform your nurse when you arrive at Short Stay. Do not shave (including legs and underarms) for at least 48 hours prior to the first CHG shower.  You may shave your face/neck.  Please follow these instructions carefully:  1.  Shower with CHG Soap the night before surgery and the  morning of surgery.  2.  If you choose to wash your hair, wash your hair first as usual with your normal  shampoo.  3.  After you shampoo, rinse your hair and body thoroughly to remove the shampoo.                             4.  Use CHG as you would any other liquid soap.  You can apply chg directly to the skin and wash.  Gently with a scrungie or clean washcloth.  5.  Apply the CHG Soap to your body ONLY FROM THE NECK DOWN.   Do not use on face/ open                           Wound or open sores. Avoid contact with eyes, ears mouth and genitals (private parts).                       Wash face,  Genitals (private parts) with your normal soap.             6.  Wash thoroughly, paying special attention to the area where your  surgery  will be performed.  7.  Thoroughly rinse your body with warm water from the neck down.  8.  DO NOT shower/wash with your normal soap after using and rinsing off the CHG Soap.            9.  Pat yourself dry with a clean towel.            10.  Wear clean pajamas.            11.  Place clean sheets on your bed the night of your first shower and do not  sleep with pets.  ON THE DAY OF SURGERY : Do not apply any lotions/deodorants the morning of surgery.  Please wear clean clothes  to the hospital/surgery center.    FAILURE TO FOLLOW THESE INSTRUCTIONS MAY RESULT IN THE CANCELLATION OF YOUR SURGERY  PATIENT SIGNATURE_________________________________  NURSE SIGNATURE__________________________________  ________________________________________________________________________

## 2022-05-14 NOTE — Progress Notes (Signed)
COVID Vaccine received:  _0  No _1  Yes Date of any COVID positive Test in last 90 days:  None  PCP - Shirline Frees, MD Cardiologist - Minus Breeding, MD ([place] 12-13-2017) Oncology- Heath Lark, MD Neurology- [facility] Neurology   Chest x-ray - 04-27-2021  2v   Epic EKG -  08-21-2019 Epic Stress Test - 11-06-2017  Epic ECHO -  Cardiac Cath - 2010 per Hochrein's note Tele Event Monitor- 11-2017  Epic  PCR screen: _2  Ordered & Completed                      _3   No Order but Needs PROFEND                      _4   N/A for this surgery  Surgery Plan:  _5  Ambulatory                            _6  Outpatient in bed                            _7  Admit  Anesthesia:    _8  General  _9  Spinal                           _10   Choice _11   MAC  Bowel Prep - _12  No  _13   Yes _____________  Pacemaker / ICD device _14  No _15  Yes        Device order form faxed _16  No    _17   Yes      Faxed to:  Spinal Cord Stimulator:_18  No _19  Yes      (Remind patient to bring remote DOS) Other Implants:   History of Sleep Apnea? _20  No _21  Yes   CPAP used?- _22  No _23  Yes    PRE-DM   Diet only, no meds  Does the patient monitor blood sugar? _24  No _25  Yes  _26  N/A  Blood Thinner / Instructions:  none Aspirin Instructions:none  ERAS Protocol Ordered: _27  No  _28  Yes PRE-SURGERY _29  ENSURE  _30  G2   Patient is to be NPO after: Midnight prior  Comments:   Activity level: Patient can not climb a flight of stairs without difficulty; _31  No CP  but would have SOB. Patient can perform ADLs without assistance.   Anesthesia review: HTN, Neurogenic bladder, Pre-DM, anxiety, GERD, Hx Hep C (Treated ), Tachycardia / Palps,  RA  Patient denies shortness of breath, fever, cough and chest pain at PAT appointment.  Patient verbalized understanding and agreement to the Pre-Surgical Instructions that were given to them at this PAT appointment. Patient was also educated of the need to review these PAT instructions again  prior to his/her surgery.I reviewed the appropriate phone numbers to call if they have any and questions or concerns.

## 2022-05-16 ENCOUNTER — Encounter (HOSPITAL_COMMUNITY): Payer: Self-pay

## 2022-05-16 ENCOUNTER — Encounter (HOSPITAL_COMMUNITY)
Admission: RE | Admit: 2022-05-16 | Discharge: 2022-05-16 | Disposition: A | Discharge status: Home / Self Care | Payer: Medicare Other | Source: Ambulatory Visit | Attending: Interventional Radiology | Admitting: Interventional Radiology

## 2022-05-16 ENCOUNTER — Other Ambulatory Visit: Payer: Self-pay

## 2022-05-16 VITALS — BP 126/83 | HR 88 | Temp 98.6°F | Resp 18 | Ht 69.0 in | Wt 174.0 lb

## 2022-05-16 DIAGNOSIS — K769 Liver disease, unspecified: Secondary | ICD-10-CM | POA: Insufficient documentation

## 2022-05-16 DIAGNOSIS — I10 Essential (primary) hypertension: Secondary | ICD-10-CM | POA: Insufficient documentation

## 2022-05-16 DIAGNOSIS — Z01818 Encounter for other preprocedural examination: Secondary | ICD-10-CM | POA: Insufficient documentation

## 2022-05-16 DIAGNOSIS — R7303 Prediabetes: Secondary | ICD-10-CM

## 2022-05-16 DIAGNOSIS — R9431 Abnormal electrocardiogram [ECG] [EKG]: Secondary | ICD-10-CM | POA: Diagnosis not present

## 2022-05-16 HISTORY — DX: Malignant (primary) neoplasm, unspecified: C80.1

## 2022-05-16 HISTORY — DX: Myoneural disorder, unspecified: G70.9

## 2022-05-16 HISTORY — DX: Personal history of urinary calculi: Z87.442

## 2022-05-16 LAB — COMPREHENSIVE METABOLIC PANEL
ALT: 20 U/L (ref 0–44)
AST: 17 U/L (ref 15–41)
Albumin: 3.8 g/dL (ref 3.5–5.0)
Alkaline Phosphatase: 81 U/L (ref 38–126)
Anion gap: 8 (ref 5–15)
BUN: 17 mg/dL (ref 8–23)
CO2: 24 mmol/L (ref 22–32)
Calcium: 9 mg/dL (ref 8.9–10.3)
Chloride: 107 mmol/L (ref 98–111)
Creatinine, Ser: 0.91 mg/dL (ref 0.44–1.00)
GFR, Estimated: >60 mL/min (ref >60)
Glucose, Bld: 117 mg/dL — ABNORMAL HIGH (ref 70–99)
Potassium: 4.2 mmol/L (ref 3.5–5.1)
Sodium: 139 mmol/L (ref 135–145)
Total Bilirubin: 0.3 mg/dL (ref 0.3–1.2)
Total Protein: 7.3 g/dL (ref 6.5–8.1)

## 2022-05-16 LAB — CBC
HCT: 42.1 % (ref 36.0–46.0)
Hemoglobin: 13.8 g/dL (ref 12.0–15.0)
MCH: 31.6 pg (ref 26.0–34.0)
MCHC: 32.8 g/dL (ref 30.0–36.0)
MCV: 96.3 fL (ref 80.0–100.0)
Platelets: 345 10*3/uL (ref 150–400)
RBC: 4.37 MIL/uL (ref 3.87–5.11)
RDW: 11.7 % (ref 11.5–15.5)
WBC: 4.8 10*3/uL (ref 4.0–10.5)
nRBC: 0 % (ref 0.0–0.2)

## 2022-05-16 LAB — HEMOGLOBIN A1C
Hgb A1c MFr Bld: 5.7 % — ABNORMAL HIGH (ref 4.8–5.6)
Mean Plasma Glucose: 116.89 mg/dL

## 2022-05-16 LAB — GLUCOSE, CAPILLARY: Glucose-Capillary: 118 mg/dL — ABNORMAL HIGH (ref 70–99)

## 2022-05-16 NOTE — H&P (Deleted)
  The note originally documented on this encounter has been moved the the encounter in which it belongs.

## 2022-05-16 NOTE — H&P (Signed)
Chief Complaint: Patient was seen in consultation today for carcinomatosis at the request of Gorsuch,Ni  Referring Physician(s): Heath Lark  Supervising Physician: Aletta Edouard  Patient Status: [facility] - Out-pt  History of Present Illness: [name] is a 67 y.o. female with PMH significant for MS, neurogenic bladder and primary peritoneal carcinomatosis.  An attempt to biopsy small omental/peritoneal nodule was made 05/09/2022 but was aborted due to patient inability to tolerate procedure.  Patient has been referred by oncology for tunneled catheter with port placement as well as reattempt of peritoneal/omental nodule biopsy under general anesthesia.  Past Medical History:  Diagnosis Date   Anxiety    Asthma    Back problem    disc disease, chronic low back pain, right leg pain, s/p discectomy 99   Cancer (HCC)    ovarian cancer   Depression    Diastolic dysfunction    with elevated LVEDP at cath   Endometriosis    Fibroids    hx of    GERD (gastroesophageal reflux disease)    Hepatitis C    treated 8-9 years ago   Hidradenitis suppurativa    History of kidney stones    Hypertension    Neuromuscular disorder (HCC)    Mulitple sclerosis   Osteoarthritis    Prediabetes    RA (rheumatoid arthritis) (HCC)    Rotator cuff tear    right shoulder    Syncope     Past Surgical History:  Procedure Laterality Date   ANGIOPLASTY     BACK SURGERY     lumbar surgery x 2 done in [place] and [place]   COLONOSCOPY  07/2010   tubular adenoma   COLONOSCOPY  08/2015   DISKECTOMY     laparoscopy for endometriosis     left shoulder scope     ROTATOR CUFF REPAIR Left    Spinal Fusion  12/2016   L4-S1    Allergies: Cymbalta [duloxetine hcl], Cyclobenzaprine hcl, Celexa  [citalopram hydrobromide], Fluticasone furoate-vilanterol, Gabapentin, Opana  [oxymorphone hcl], and Tramadol  Medications: Prior to Admission medications   Medication Sig Start Date End Date  Taking? Authorizing Provider  albuterol (VENTOLIN HFA) 108 (90 Base) MCG/ACT inhaler Inhale 2 puffs into the lungs every 6 (six) hours as needed for shortness of breath. 07/30/21   [provider]  amphetamine-dextroamphetamine (ADDERALL) 20 MG tablet Take 20 mg by mouth daily.    [provider]  Ascorbic Acid (VITAMIN C) 1000 MG tablet Take 1,000 mg by mouth once a week.    [provider]  ASHWAGANDHA PO Take 2 capsules by mouth daily.    [provider]  BLACK CURRANT SEED OIL PO Take 5 mLs by mouth daily.    [provider]  cyclobenzaprine (FLEXERIL) 5 MG tablet Take 5-10 mg by mouth 3 (three) times daily as needed for muscle spasms.    [provider]  diclofenac Sodium (VOLTAREN) 1 % GEL Apply 2 g topically daily as needed (Hand pain). 08/12/19   [provider]  Fluticasone-Salmeterol (ADVAIR) 250-50 MCG/DOSE AEPB Inhale 1 puff into the lungs daily as needed (breathing.). [place]    [provider]  hydroxychloroquine (PLAQUENIL) 200 MG tablet Take 200 mg by mouth 2 (two) times daily as needed (Arthritis).    [provider]  irbesartan (AVAPRO) 300 MG tablet Take 300 mg by mouth daily. 09/18/17   [provider]  LORazepam (ATIVAN) 0.5 MG tablet Take 0.5 mg by mouth every 8 (eight)  hours as needed for anxiety.    [provider]  meclizine (ANTIVERT) 12.5 MG tablet Take 12.5 mg by mouth 3 (three) times daily as needed for dizziness. 08/02/21   [provider]  Melatonin 10 MG TABS Take 10 mg by mouth at bedtime as needed (sleep).    [provider]  Multiple Vitamin (MULTIVITAMIN WITH MINERALS) TABS tablet Take 1 tablet by mouth daily.    [provider]  mupirocin ointment (BACTROBAN) 2 % Apply 1 Application topically 3 (three) times daily as needed (boils). 08/02/21   [provider]  naloxone [facility]) nasal spray 4 mg/0.1 mL Place 1 spray into the nose as needed  (opioid overdose).    [provider]  naproxen (NAPROSYN) 500 MG tablet Take 500 mg by mouth 2 (two) times daily as needed for pain. 07/16/19   [provider]  omeprazole (PRILOSEC) 20 MG capsule Take 20 mg by mouth daily as needed (acid reflux).    [provider]  OVER THE COUNTER MEDICATION Take 2 capsules by mouth daily. Sea moss supplement    [provider]  OVER THE COUNTER MEDICATION Take 2 capsules by mouth at bedtime. Liver focus otc supplement    [provider]  Oxycodone HCl 20 MG TABS Take 20 mg by mouth 4 (four) times daily as needed (pain).    [provider]  polyethylene glycol (MIRALAX / GLYCOLAX) 17 g packet Take 17 g by mouth daily as needed for moderate constipation.    [provider]  Potassium (POTASSIMIN PO) Take 1 tablet by mouth once a week.    [provider]  TURMERIC CURCUMIN PO Take 1,000 mg by mouth daily.    [provider]  VITAMIN A PALMITATE PO Take by mouth.    [provider]  VITAMIN A-BETA CAROTENE PO Take 2,400 Units by mouth once a week.    [provider]  Vitamin D, Ergocalciferol, (DRISDOL) 1.25 MG (50000 UNIT) CAPS capsule Take 50,000 Units by mouth every Sunday.    [provider]  XTAMPZA ER 9 MG C12A Take 9 mg by mouth 2 (two) times daily. 07/12/21   [provider]  zinc gluconate 50 MG tablet Take 50 mg by mouth once a week.    [provider]     Family History  Problem Relation Age of Onset   Heart disease Mother        Died with MI 46, chest pain 62s   Hypertension Mother    Pancreatitis Father    Atrial fibrillation Sister    Breast cancer Maternal Aunt    Multiple sclerosis Cousin     Social History   Socioeconomic History   Marital status: Single    Spouse name: Not on file   Number of children: 1   Years of education: Not on file   Highest education level: Not on file  Occupational History   Not on  file  Tobacco Use   Smoking status: Former    Packs/day: 0.10    Types: Cigarettes    Quit date: 2022    Years since quitting: 2.0   Smokeless tobacco: Never  Vaping Use   Vaping Use: Never used  Substance and Sexual Activity   Alcohol use: Yes    Comment: Occasional    Drug use: Yes    Types: Marijuana   Sexual activity: Yes  Other Topics Concern   Not on file  Social History Narrative  Lives alone   Right handed   Caffeine: none    Social Determinants of Health   Financial Resource Strain: Not on file  Food Insecurity: Not on file  Transportation Needs: Not on file  Physical Activity: Not on file  Stress: Not on file  Social Connections: Not on file    Review of Systems: A 12 point ROS discussed and pertinent positives are indicated in the HPI above.  All other systems are negative.  Review of Systems  Constitutional:  Positive for appetite change. Negative for chills, fever and unexpected weight change.  Respiratory:  Negative for cough and shortness of breath.   Cardiovascular:  Positive for leg swelling. Negative for chest pain.  Gastrointestinal:  Positive for abdominal pain. Negative for nausea and vomiting.  Musculoskeletal:  Positive for myalgias.  Neurological:  Negative for dizziness and headaches.    Vital Signs:  142/88, HR 77, RR 18, 98% RA, 98.1   Physical Exam Vitals reviewed.  Constitutional:      General: She is not in acute distress.    Appearance: Normal appearance. She is not ill-appearing.  HENT:     Head: Normocephalic and atraumatic.     Mouth/Throat:     Mouth: Mucous membranes are moist.  Eyes:     Extraocular Movements: Extraocular movements intact.     Pupils: Pupils are equal, round, and reactive to light.  Cardiovascular:     Rate and Rhythm: Normal rate and regular rhythm.     Pulses: Normal pulses.     Heart sounds: Normal heart sounds. No murmur heard. Pulmonary:     Effort: Pulmonary effort is normal. No respiratory  distress.     Breath sounds: Normal breath sounds.  Abdominal:     General: Bowel sounds are normal. There is no distension.     Palpations: Abdomen is soft.     Tenderness: There is abdominal tenderness. There is guarding.  Musculoskeletal:     Right lower leg: No edema.     Left lower leg: No edema.  Skin:    General: Skin is warm and dry.  Neurological:     Mental Status: She is alert and oriented to person, place, and time.  Psychiatric:        Mood and Affect: Mood normal.        Thought Content: Thought content normal.        Judgment: Judgment normal.     Imaging: CT BIOPSY  Result Date: 05/09/2022 INDICATION: 67 year old with bulky peritoneal disease in the pelvis and scattered peritoneal nodules in the abdomen. Patient needs a tissue diagnosis. EXAM: ATTEMPTED IMAGE GUIDED BIOPSY OF ABDOMINAL PERITONEAL NODULE Physician: Stephan Minister. Henn, MD MEDICATIONS: Moderate sedation ANESTHESIA/SEDATION: Moderate (conscious) sedation was employed during this procedure. A total of Versed '3mg'$  and fentanyl 150 mcg was administered intravenously at the order of the provider performing the procedure. Total intra-service moderate sedation time: 41 minutes. Patient's level of consciousness and vital signs were monitored continuously by radiology nurse throughout the procedure under the supervision of the provider performing the procedure. COMPLICATIONS: None immediate. PROCEDURE: The procedure was explained to the patient. The risks and benefits of the procedure were discussed and the patient's questions were addressed. Informed consent was obtained from the patient. Patient was placed supine on the CT scanner. Images through the abdomen are obtained. A peritoneal nodule anterior to the right hepatic lobe was felt to be a safe site for biopsy. This nodule was also visible using ultrasound. The right  upper abdomen was prepped with chlorhexidine and sterile field was created. Skin was anesthetized using 1%  lidocaine. Unfortunately, the patient was unable to get comfortable throughout the procedure and she was experiencing pain despite moderate sedation. Skin was anesthetized using 1% lidocaine. A small incision was made. Using ultrasound guidance, attempted to direct a 17 gauge coaxial needle into the small nodule in the perihepatic space. Patient had significant pain and discomfort when the needle was advanced into the peritoneal cavity. Attempted fine-needle aspiration with a 22 gauge needle but this was also unsuccessful due to patient's significant breathing and pain discomfort. As a result, the procedure was aborted. Bandage was placed at the puncture site. RADIATION DOSE REDUCTION: This exam was performed according to the departmental dose-optimization program which includes automated exposure control, adjustment of the mA and/or kV according to patient size and/or use of iterative reconstruction technique. FINDINGS: CT images of the abdomen demonstrate multiple small peritoneal nodules in the anterior abdomen. 1.0 cm nodule just anterior to the right hepatic lobe was targeted and was visible with ultrasound. Despite adequate visualization, needle could not be successfully advanced into this nodule due to patient's significant pain and heavy breathing. A biopsy was not obtained. IMPRESSION: Unsuccessful biopsy of an anterior peritoneal nodule. Repeat biopsy will likely require anesthesia assistance and/or laparoscopic approach. Electronically Signed   By: Markus Daft M.D.   On: 05/09/2022 17:20   CT CHEST ABDOMEN PELVIS W CONTRAST  Result Date: 04/30/2022 CLINICAL DATA:  Primary peritoneal carcinomatosis versus ovarian cancer, staging, * Tracking Code: BO * EXAM: CT CHEST, ABDOMEN, AND PELVIS WITH CONTRAST TECHNIQUE: Multidetector CT imaging of the chest, abdomen and pelvis was performed following the standard protocol during bolus administration of intravenous contrast. RADIATION DOSE REDUCTION: This exam  was performed according to the departmental dose-optimization program which includes automated exposure control, adjustment of the mA and/or kV according to patient size and/or use of iterative reconstruction technique. CONTRAST:  147m OMNIPAQUE IOHEXOL 300 MG/ML SOLN additional oral enteric contrast COMPARISON:  04/11/2022 FINDINGS: CT CHEST FINDINGS Cardiovascular: Aortic atherosclerosis. Normal heart size. No pericardial effusion. Mediastinum/Nodes: No enlarged mediastinal, hilar, or axillary lymph nodes. Thyroid gland, trachea, and esophagus demonstrate no significant findings. Lungs/Pleura: Mild diffuse bilateral bronchial wall thickening. Background of very fine centrilobular nodularity, most concentrated in the lung apices. No pleural effusion or pneumothorax. Musculoskeletal: No chest wall abnormality. No acute osseous findings. CT ABDOMEN PELVIS FINDINGS Hepatobiliary: No solid liver abnormality is seen. No gallstones, gallbladder wall thickening, or biliary dilatation. Pancreas: Unremarkable. No pancreatic ductal dilatation or surrounding inflammatory changes. Spleen: Normal in size without significant abnormality. Adrenals/Urinary Tract: Soft tissue attenuation nodule of the body of the right adrenal gland measuring 2.5 x 1.8 cm (series 2, image 59). Notably, this was also soft tissue attenuation on prior noncontrast examination although unchanged compared to prior examinations dating back to 20263and almost certainly a benign adenoma. 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 The mid to distal right ureter is obstructed by right ovarian mass and or soft tissue nodularity. Left kidney is normal, without renal calculi, solid lesion, or hydronephrosis. Calculus within the most distal left ureterovesicular junction or just within the bladder lumen measuring 0.7 cm (series 2, image 110). Stomach/Bowel:  Stomach is within normal limits. Appendix appears normal. The distal sigmoid colon is involved by and indistinguishable from closely adjacent pelvic masses and nodularity (series 2, image 100). No evidence of bowel obstruction.  Occasional sigmoid diverticula. Vascular/Lymphatic: No significant vascular findings are present. No enlarged abdominal or pelvic lymph nodes. Reproductive: Bulky bilateral ovarian masses and nodules, which are essentially confluence with adjacent pelvic soft tissue nodularity largest discrete component in the vicinity of the left ovary measuring 6.9 x 3.8 cm (series 2, image 100), largest discrete component in the vicinity of the right ovary measuring 5.2 x 3.1 cm (series 2, image 99). Other: No abdominal wall hernia or abnormality. Small volume of loculated appearing fluid low pelvis (series 2, image 105). Extensive pelvic peritoneal thickening and nodularity index nodule in the posterior pelvis measuring 2.7 x 2.1 cm (series 2, image 106). Multiple small peritoneal nodules throughout the abdomen and pelvis, for example anterior to the liver measuring 0.8 x 0.5 cm (series 2, image 65) the ventral left hemiabdomen measuring 0.8 x 0.6 cm (series 2, image 63). Musculoskeletal: No acute osseous findings. IMPRESSION: 1. Bulky bilateral ovarian masses and nodules, left-greater-than-right, which are essentially confluent with adjacent pelvic soft tissue nodularity and not significantly changed compared to prior examination dated 04/11/2022. 2. Extensive pelvic peritoneal thickening and nodularity. Multiple small peritoneal nodules throughout the abdomen and pelvis. Findings are consistent with peritoneal metastatic disease and likewise not significantly changed. 3. Small volume of loculated appearing fluid in the low pelvis. 4. 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 The mid to distal right  ureter is obstructed by right ovarian mass and or soft tissue nodularity. 5. Calculus within the most distal left ureterovesicular junction or just within the bladder lumen measuring 0.7 cm, unchanged. No associated left-sided hydronephrosis. 6. Soft tissue attenuation nodule of the body of the right adrenal gland. Notably, this was also soft tissue attenuation on prior noncontrast examination (i.e. not definitively macroscopic fat containing) although unchanged compared to prior examinations dating back to 1610 and almost certainly a benign adenoma. Attention on follow-up. 7. No evidence of metastatic disease in the chest. 8. Mild diffuse bilateral bronchial wall thickening. Background of very fine centrilobular nodularity, most concentrated in the lung apices. Findings are most consistent with smoking-related respiratory bronchiolitis. Aortic Atherosclerosis (ICD10-I70.0). Electronically Signed   By: Delanna Ahmadi M.D.   On: 04/30/2022 14:37    Labs:  CBC: Recent Labs    05/09/22 0905 05/16/22 0951  WBC 3.0* 4.8  HGB 15.0 13.8  HCT 43.3 42.1  PLT 323 345    COAGS: Recent Labs    05/09/22 0905  INR 0.9    BMP: Recent Labs    04/18/22 1126 05/16/22 0951  NA 141 139  K 4.1 4.2  CL 105 107  CO2 28 24  GLUCOSE 96 117*  BUN 22 17  CALCIUM 10.1 9.0  CREATININE 0.85 0.91  GFRNONAA >60 >60    LIVER FUNCTION TESTS: Recent Labs    05/16/22 0951  BILITOT 0.3  AST 17  ALT 20  ALKPHOS 81  PROT 7.3  ALBUMIN 3.8    TUMOR MARKERS: Recent Labs    04/18/22 1126  CEA 1.69    Assessment and Plan:  67 year old female with recently diagnosed ovarian mass and peritoneal nodules presents to IR for biopsy as well as tunneled catheter with port placement under general anesthesia.   Risks and benefits of peritoneal nodule biopsy under general anesthesia was discussed with the patient and/or patient's family including, but not limited to bleeding, infection, damage to adjacent  structures or low yield requiring additional tests. Risks and benefits of  image guided tunneled catheter with port placement was discussed with the patient including, but not limited to bleeding, infection, pneumothorax, or fibrin sheath development and need for additional procedures.  All of the patient's questions were answered, patient is agreeable to proceed. Consent signed and in chart.  All of the questions were answered and there is agreement to proceed.  Consent signed and in chart.   Thank you for this interesting consult.  I greatly enjoyed meeting [name] and look forward to participating in their care.  A copy of this report was sent to the requesting provider on this date.  Electronically Signed: Tyson Alias, NP 05/18/2022, 8:07 AM   I spent a total of 20 minutes in face to face in clinical consultation, greater than 50% of which was counseling/coordinating care for peritoneal carcinomatosis.

## 2022-05-17 ENCOUNTER — Other Ambulatory Visit: Payer: Self-pay | Admitting: Physician Assistant

## 2022-05-17 DIAGNOSIS — K769 Liver disease, unspecified: Secondary | ICD-10-CM

## 2022-05-17 DIAGNOSIS — R7303 Prediabetes: Secondary | ICD-10-CM

## 2022-05-18 ENCOUNTER — Ambulatory Visit (HOSPITAL_COMMUNITY): Payer: 59 | Admitting: Physician Assistant

## 2022-05-18 ENCOUNTER — Encounter (HOSPITAL_COMMUNITY)
Admission: RE | Disposition: A | Discharge status: Home / Self Care | Payer: Self-pay | Source: Ambulatory Visit | Attending: Interventional Radiology

## 2022-05-18 ENCOUNTER — Ambulatory Visit (HOSPITAL_BASED_OUTPATIENT_CLINIC_OR_DEPARTMENT_OTHER): Payer: 59 | Admitting: Anesthesiology

## 2022-05-18 ENCOUNTER — Ambulatory Visit (HOSPITAL_COMMUNITY)
Admission: RE | Admit: 2022-05-18 | Discharge: 2022-05-18 | Disposition: A | Discharge status: Home / Self Care | Payer: 59 | Source: Ambulatory Visit | Attending: Hematology and Oncology | Admitting: Hematology and Oncology

## 2022-05-18 ENCOUNTER — Ambulatory Visit (HOSPITAL_COMMUNITY)
Admission: RE | Admit: 2022-05-18 | Discharge: 2022-05-18 | Disposition: A | Discharge status: Home / Self Care | Payer: 59 | Source: Ambulatory Visit | Attending: Interventional Radiology | Admitting: Interventional Radiology

## 2022-05-18 ENCOUNTER — Encounter (HOSPITAL_COMMUNITY): Payer: Self-pay | Admitting: Interventional Radiology

## 2022-05-18 ENCOUNTER — Ambulatory Visit (HOSPITAL_COMMUNITY): Payer: Medicare Other | Attending: Interventional Radiology

## 2022-05-18 ENCOUNTER — Encounter (HOSPITAL_COMMUNITY): Payer: Self-pay

## 2022-05-18 DIAGNOSIS — I10 Essential (primary) hypertension: Secondary | ICD-10-CM | POA: Diagnosis not present

## 2022-05-18 DIAGNOSIS — K769 Liver disease, unspecified: Secondary | ICD-10-CM

## 2022-05-18 DIAGNOSIS — C800 Disseminated malignant neoplasm, unspecified: Secondary | ICD-10-CM

## 2022-05-18 DIAGNOSIS — J45909 Unspecified asthma, uncomplicated: Secondary | ICD-10-CM | POA: Diagnosis not present

## 2022-05-18 DIAGNOSIS — G35 Multiple sclerosis: Secondary | ICD-10-CM | POA: Insufficient documentation

## 2022-05-18 DIAGNOSIS — B182 Chronic viral hepatitis C: Secondary | ICD-10-CM | POA: Diagnosis not present

## 2022-05-18 DIAGNOSIS — Z803 Family history of malignant neoplasm of breast: Secondary | ICD-10-CM | POA: Insufficient documentation

## 2022-05-18 DIAGNOSIS — R7303 Prediabetes: Secondary | ICD-10-CM

## 2022-05-18 DIAGNOSIS — C786 Secondary malignant neoplasm of retroperitoneum and peritoneum: Secondary | ICD-10-CM

## 2022-05-18 DIAGNOSIS — Z87891 Personal history of nicotine dependence: Secondary | ICD-10-CM

## 2022-05-18 DIAGNOSIS — C801 Malignant (primary) neoplasm, unspecified: Secondary | ICD-10-CM

## 2022-05-18 DIAGNOSIS — F418 Other specified anxiety disorders: Secondary | ICD-10-CM | POA: Diagnosis not present

## 2022-05-18 DIAGNOSIS — K219 Gastro-esophageal reflux disease without esophagitis: Secondary | ICD-10-CM | POA: Insufficient documentation

## 2022-05-18 HISTORY — PX: RADIOLOGY WITH ANESTHESIA: SHX6223

## 2022-05-18 HISTORY — PX: IR US GUIDE BX ASP/DRAIN: IMG2392

## 2022-05-18 HISTORY — PX: IR IMAGING GUIDED PORT INSERTION: IMG5740

## 2022-05-18 LAB — GLUCOSE, CAPILLARY: Glucose-Capillary: 116 mg/dL — ABNORMAL HIGH (ref 70–99)

## 2022-05-18 SURGERY — IR WITH ANESTHESIA
Anesthesia: General

## 2022-05-18 MED ORDER — SODIUM CHLORIDE 0.9 % IV SOLN: INTRAVENOUS | Status: DC

## 2022-05-18 MED ORDER — ONDANSETRON HCL 4 MG/2ML IJ SOLN
INTRAMUSCULAR | Status: DC | PRN
  Administered 2022-05-18: 4 mg via INTRAVENOUS

## 2022-05-18 MED ORDER — CEFAZOLIN SODIUM-DEXTROSE 2-4 GM/100ML-% IV SOLN
INTRAVENOUS | Status: AC
  Administered 2022-05-18: 2 g via INTRAVENOUS
  Filled 2022-05-18: qty 100

## 2022-05-18 MED ORDER — LACTATED RINGERS IV SOLN: INTRAVENOUS | Status: DC

## 2022-05-18 MED ORDER — LIDOCAINE HCL 1 % IJ SOLN
INTRAMUSCULAR | Status: AC
  Administered 2022-05-18: 6 mL
  Filled 2022-05-18: qty 40

## 2022-05-18 MED ORDER — FENTANYL CITRATE PF 50 MCG/ML IJ SOSY
25.0000 ug | PREFILLED_SYRINGE | INTRAMUSCULAR | Status: DC | PRN
  Administered 2022-05-18: 50 ug via INTRAVENOUS

## 2022-05-18 MED ORDER — HEPARIN SOD (PORK) LOCK FLUSH 100 UNIT/ML IV SOLN
INTRAVENOUS | Status: AC
  Administered 2022-05-18: 500 [IU]
  Filled 2022-05-18: qty 5

## 2022-05-18 MED ORDER — HYDROCODONE-ACETAMINOPHEN 5-325 MG PO TABS: 1.0000 | ORAL_TABLET | ORAL | Status: DC | PRN

## 2022-05-18 MED ORDER — ORAL CARE MOUTH RINSE: 15.0000 mL | Freq: Once | OROMUCOSAL | Status: AC

## 2022-05-18 MED ORDER — ROCURONIUM BROMIDE 100 MG/10ML IV SOLN
INTRAVENOUS | Status: DC | PRN
  Administered 2022-05-18: 50 mg via INTRAVENOUS

## 2022-05-18 MED ORDER — LIDOCAINE HCL (CARDIAC) PF 100 MG/5ML IV SOSY
PREFILLED_SYRINGE | INTRAVENOUS | Status: DC | PRN
  Administered 2022-05-18: 60 mg via INTRAVENOUS

## 2022-05-18 MED ORDER — CEFAZOLIN SODIUM-DEXTROSE 2-4 GM/100ML-% IV SOLN: 2.0000 g | Freq: Once | INTRAVENOUS | Status: AC

## 2022-05-18 MED ORDER — PROPOFOL 10 MG/ML IV BOLUS
INTRAVENOUS | Status: DC | PRN
  Administered 2022-05-18: 150 mg via INTRAVENOUS

## 2022-05-18 MED ORDER — DEXAMETHASONE SODIUM PHOSPHATE 10 MG/ML IJ SOLN
INTRAMUSCULAR | Status: DC | PRN
  Administered 2022-05-18: 10 mg via INTRAVENOUS

## 2022-05-18 MED ORDER — CHLORHEXIDINE GLUCONATE 0.12 % MT SOLN
15.0000 mL | Freq: Once | OROMUCOSAL | Status: AC
  Administered 2022-05-18: 15 mL via OROMUCOSAL

## 2022-05-18 MED ORDER — ONDANSETRON HCL 4 MG/2ML IJ SOLN: 4.0000 mg | Freq: Four times a day (QID) | INTRAMUSCULAR | Status: DC | PRN

## 2022-05-18 MED ORDER — MIDAZOLAM HCL 5 MG/5ML IJ SOLN
INTRAMUSCULAR | Status: DC | PRN
  Administered 2022-05-18: 2 mg via INTRAVENOUS

## 2022-05-18 MED ORDER — LABETALOL HCL 5 MG/ML IV SOLN
INTRAVENOUS | Status: AC
  Filled 2022-05-18: qty 4

## 2022-05-18 MED ORDER — FENTANYL CITRATE PF 50 MCG/ML IJ SOSY
PREFILLED_SYRINGE | INTRAMUSCULAR | Status: AC
  Filled 2022-05-18: qty 1

## 2022-05-18 MED ORDER — MIDAZOLAM HCL 2 MG/2ML IJ SOLN
INTRAMUSCULAR | Status: AC
  Filled 2022-05-18: qty 2

## 2022-05-18 MED ORDER — SUGAMMADEX SODIUM 500 MG/5ML IV SOLN
INTRAVENOUS | Status: DC | PRN
  Administered 2022-05-18: 400 mg via INTRAVENOUS

## 2022-05-18 MED ORDER — FENTANYL CITRATE (PF) 100 MCG/2ML IJ SOLN
INTRAMUSCULAR | Status: DC | PRN
  Administered 2022-05-18 (×2): 100 ug via INTRAVENOUS

## 2022-05-18 MED ORDER — EPHEDRINE SULFATE (PRESSORS) 50 MG/ML IJ SOLN
INTRAMUSCULAR | Status: DC | PRN
  Administered 2022-05-18 (×3): 5 mg via INTRAVENOUS

## 2022-05-18 MED ORDER — FENTANYL CITRATE (PF) 100 MCG/2ML IJ SOLN
INTRAMUSCULAR | Status: AC
  Filled 2022-05-18: qty 4

## 2022-05-18 MED ORDER — LABETALOL HCL 5 MG/ML IV SOLN
10.0000 mg | Freq: Once | INTRAVENOUS | Status: AC
  Administered 2022-05-18: 10 mg via INTRAVENOUS

## 2022-05-18 MED ORDER — PHENYLEPHRINE HCL (PRESSORS) 10 MG/ML IV SOLN
INTRAVENOUS | Status: DC | PRN
  Administered 2022-05-18: 80 ug via INTRAVENOUS
  Administered 2022-05-18 (×3): 160 ug via INTRAVENOUS
  Administered 2022-05-18: 80 ug via INTRAVENOUS
  Administered 2022-05-18 (×3): 160 ug via INTRAVENOUS

## 2022-05-18 NOTE — Transfer of Care (Signed)
Immediate Anesthesia Transfer of Care Note  Patient: [name]  Procedure(s) Performed: IR WITH ANESTHESIA PORT AND BIOPSY IR IMAGING GUIDED PORT INSERTION IR US GUIDE BX ASP/DRAIN  Patient Location: PACU  Anesthesia Type:General  Level of Consciousness: awake, alert , oriented, and patient cooperative  Airway & Oxygen Therapy: Patient Spontanous Breathing and Patient connected to face mask oxygen  Post-op Assessment: Report given to RN, Post -op Vital signs reviewed and stable, and Patient moving all extremities X 4  Post vital signs: Reviewed and stable  Last Vitals:  Vitals Value Taken Time  BP 151/97 05/18/22 1006  Temp    Pulse 78 05/18/22 1009  Resp 20 05/18/22 1009  SpO2 100 % 05/18/22 1009  Vitals shown include unvalidated device data.  Last Pain:  Vitals:   05/18/22 0652  TempSrc:   PainSc: 4       Patients Stated Pain Goal: 4 (35/68/61 6837)  Complications: No notable events documented.

## 2022-05-18 NOTE — Anesthesia Procedure Notes (Signed)
Procedure Name: Intubation Date/Time: 05/18/2022 8:36 AM  Performed by: Jonna Munro, CRNAPre-anesthesia Checklist: Patient identified, Emergency Drugs available, Suction available, Patient being monitored and Timeout performed Patient Re-evaluated:Patient Re-evaluated prior to induction Oxygen Delivery Method: Circle system utilized Preoxygenation: Pre-oxygenation with 100% oxygen Induction Type: IV induction Ventilation: Mask ventilation without difficulty Laryngoscope Size: Mac and 3 Grade View: Grade I Tube type: Oral Tube size: 7.0 mm Number of attempts: 1 Airway Equipment and Method: Stylet Placement Confirmation: ETT inserted through vocal cords under direct vision, positive ETCO2, CO2 detector and breath sounds checked- equal and bilateral Secured at: 23 cm Tube secured with: Tape Dental Injury: Teeth and Oropharynx as per pre-operative assessment

## 2022-05-18 NOTE — Anesthesia Preprocedure Evaluation (Signed)
Anesthesia Evaluation  Patient identified by MRN, date of birth, ID band Patient awake    Reviewed: Allergy & Precautions, H&P , NPO status , Patient's Chart, lab work & pertinent test results  Airway Mallampati: II   Neck ROM: full    Dental   Pulmonary asthma , former smoker   breath sounds clear to auscultation       Cardiovascular hypertension,  Rhythm:regular Rate:Normal     Neuro/Psych  PSYCHIATRIC DISORDERS Anxiety Depression       GI/Hepatic ,GERD  ,,(+) Hepatitis -, C  Endo/Other    Renal/GU      Musculoskeletal  (+) Arthritis , Rheumatoid disorders,    Abdominal   Peds  Hematology   Anesthesia Other Findings   Reproductive/Obstetrics                             Anesthesia Physical Anesthesia Plan  ASA: 3  Anesthesia Plan: General   Post-op Pain Management:    Induction: Intravenous  PONV Risk Score and Plan: 3 and Ondansetron, Dexamethasone, Midazolam and Treatment may vary due to age or medical condition  Airway Management Planned: Oral ETT  Additional Equipment:   Intra-op Plan:   Post-operative Plan:   Informed Consent: I have reviewed the patients History and Physical, chart, labs and discussed the procedure including the risks, benefits and alternatives for the proposed anesthesia with the patient or authorized representative who has indicated his/her understanding and acceptance.     Dental advisory given  Plan Discussed with: CRNA, Anesthesiologist and Surgeon  Anesthesia Plan Comments:        Anesthesia Quick Evaluation

## 2022-05-18 NOTE — Procedures (Signed)
Interventional Radiology Procedure Note  Procedure: 1) Single Lumen Power Port Placement; 2) US guided biopsy of peritoneal mass  Access:  Right IJ vein.  Anesthesia: General  Findings: Catheter tip positioned at SVC/RA junction. Port is ready for immediate use.   0.9 mm superficial peritoneal mass anterior to right lobe of liver sampled under US guidance via 17G needle. 18G core biopsy samples x 3 submitted in formalin to Pathology.  Complications: None  EBL: < 10 mL  Recommendations:  - Ok to shower in 24 hours - Do not submerge for 7 days - Routine line care   PACU recovery followed by discharge today.  Venetia Night. Kathlene Cote, M.D Pager:  [phone]

## 2022-05-18 NOTE — Anesthesia Postprocedure Evaluation (Signed)
Anesthesia Post Note  Patient: [name]  Procedure(s) Performed: IR WITH ANESTHESIA PORT AND BIOPSY IR IMAGING GUIDED PORT INSERTION IR US GUIDE BX ASP/DRAIN     Patient location during evaluation: PACU Anesthesia Type: General Level of consciousness: awake and alert Pain management: pain level controlled Vital Signs Assessment: post-procedure vital signs reviewed and stable Respiratory status: spontaneous breathing, nonlabored ventilation, respiratory function stable and patient connected to nasal cannula oxygen Cardiovascular status: blood pressure returned to baseline and stable Postop Assessment: no apparent nausea or vomiting Anesthetic complications: no   No notable events documented.  Last Vitals:  Vitals:   05/18/22 1047 05/18/22 1055  BP: (!) 150/104 (!) 142/90  Pulse: 77 75  Resp: 13 (!) 8  Temp:    SpO2: 100% 100%    Last Pain:  Vitals:   05/18/22 1037  TempSrc:   PainSc: [place]

## 2022-05-19 ENCOUNTER — Encounter (HOSPITAL_COMMUNITY): Payer: Self-pay | Admitting: Interventional Radiology

## 2022-05-23 ENCOUNTER — Other Ambulatory Visit: Payer: Self-pay | Admitting: Hematology and Oncology

## 2022-05-23 ENCOUNTER — Encounter: Payer: Self-pay | Admitting: Hematology and Oncology

## 2022-05-23 ENCOUNTER — Telehealth: Payer: Self-pay | Admitting: Oncology

## 2022-05-23 DIAGNOSIS — C5700 Malignant neoplasm of unspecified fallopian tube: Secondary | ICD-10-CM

## 2022-05-23 DIAGNOSIS — C563 Malignant neoplasm of bilateral ovaries: Secondary | ICD-10-CM

## 2022-05-23 MED ORDER — DEXAMETHASONE 4 MG PO TABS: ORAL_TABLET | ORAL | 6 refills | Status: DC

## 2022-05-23 MED ORDER — ONDANSETRON HCL 8 MG PO TABS: 8.0000 mg | ORAL_TABLET | Freq: Three times a day (TID) | ORAL | 1 refills | Status: DC | PRN

## 2022-05-23 MED ORDER — LIDOCAINE-PRILOCAINE 2.5-2.5 % EX CREA: TOPICAL_CREAM | CUTANEOUS | 3 refills | Status: DC

## 2022-05-23 MED ORDER — PROCHLORPERAZINE MALEATE 10 MG PO TABS: 10.0000 mg | ORAL_TABLET | Freq: Four times a day (QID) | ORAL | 1 refills | Status: DC | PRN

## 2022-05-23 NOTE — Telephone Encounter (Signed)
Called [facility] and scheduled appointments to see Dr. Alvy Bimler at 2:00 on 05/30/22 followed by labs at 3:00 and patient education at 4:00.  Discussed that Dr. Alvy Bimler is planning on her to start chemotherapy on 06/02/22.  She verbalized understanding and agreement of appointments.

## 2022-05-23 NOTE — Progress Notes (Signed)
START ON PATHWAY REGIMEN - Ovarian     A cycle is every 21 days:     Paclitaxel      Carboplatin   **Always confirm dose/schedule in your pharmacy ordering system**  Patient Characteristics: Preoperative or Nonsurgical Candidate (Clinical Staging), Newly Diagnosed, Neoadjuvant Therapy followed by Surgery BRCA Mutation Status: Awaiting Test Results Therapeutic Status: Preoperative or Nonsurgical Candidate (Clinical Staging) AJCC T Category: cT3 AJCC 8 Stage Grouping: Unknown AJCC N Category: cN0 AJCC M Category: cM0 Therapy Plan: Neoadjuvant Therapy followed by Surgery Intent of Therapy: Curative Intent, Discussed with Patient 

## 2022-05-24 ENCOUNTER — Other Ambulatory Visit: Payer: Self-pay

## 2022-05-24 LAB — SURGICAL PATHOLOGY

## 2022-05-26 ENCOUNTER — Encounter: Payer: Self-pay | Admitting: Hematology and Oncology

## 2022-05-26 ENCOUNTER — Other Ambulatory Visit: Payer: Self-pay

## 2022-05-26 DIAGNOSIS — G35 Multiple sclerosis: Secondary | ICD-10-CM | POA: Diagnosis not present

## 2022-05-26 NOTE — Progress Notes (Signed)
Pharmacist Chemotherapy Monitoring - Initial Assessment    Anticipated start date: 06/02/22   The following has been reviewed per standard work regarding the patient's treatment regimen: The patient's diagnosis, treatment plan and drug doses, and organ/hematologic function Lab orders and baseline tests specific to treatment regimen  The treatment plan start date, drug sequencing, and pre-medications Prior authorization status  Patient's documented medication list, including drug-drug interaction screen and prescriptions for anti-emetics and supportive care specific to the treatment regimen The drug concentrations, fluid compatibility, administration routes, and timing of the medications to be used The patient's access for treatment and lifetime cumulative dose history, if applicable  The patient's medication allergies and previous infusion related reactions, if applicable   Changes made to treatment plan:  N/A  Follow up needed:  Pending authorization for treatment    Judge Stall, [facility], 05/26/2022  4:46 PM

## 2022-05-29 ENCOUNTER — Emergency Department (HOSPITAL_BASED_OUTPATIENT_CLINIC_OR_DEPARTMENT_OTHER): Payer: 59

## 2022-05-29 ENCOUNTER — Inpatient Hospital Stay (HOSPITAL_COMMUNITY)
Admission: EM | Admit: 2022-05-29 | Discharge: 2022-06-01 | DRG: 300 | Disposition: A | Discharge status: Home / Self Care | Payer: 59 | Attending: Family Medicine | Admitting: Family Medicine

## 2022-05-29 ENCOUNTER — Emergency Department (HOSPITAL_COMMUNITY): Payer: 59

## 2022-05-29 ENCOUNTER — Other Ambulatory Visit: Payer: Self-pay

## 2022-05-29 DIAGNOSIS — R52 Pain, unspecified: Secondary | ICD-10-CM

## 2022-05-29 DIAGNOSIS — Z803 Family history of malignant neoplasm of breast: Secondary | ICD-10-CM

## 2022-05-29 DIAGNOSIS — L03119 Cellulitis of unspecified part of limb: Secondary | ICD-10-CM

## 2022-05-29 DIAGNOSIS — Z8249 Family history of ischemic heart disease and other diseases of the circulatory system: Secondary | ICD-10-CM

## 2022-05-29 DIAGNOSIS — Z79891 Long term (current) use of opiate analgesic: Secondary | ICD-10-CM | POA: Insufficient documentation

## 2022-05-29 DIAGNOSIS — L03115 Cellulitis of right lower limb: Secondary | ICD-10-CM | POA: Diagnosis not present

## 2022-05-29 DIAGNOSIS — I82429 Acute embolism and thrombosis of unspecified iliac vein: Secondary | ICD-10-CM

## 2022-05-29 DIAGNOSIS — R600 Localized edema: Secondary | ICD-10-CM

## 2022-05-29 DIAGNOSIS — M1990 Unspecified osteoarthritis, unspecified site: Secondary | ICD-10-CM | POA: Diagnosis present

## 2022-05-29 DIAGNOSIS — Z87891 Personal history of nicotine dependence: Secondary | ICD-10-CM

## 2022-05-29 DIAGNOSIS — M069 Rheumatoid arthritis, unspecified: Secondary | ICD-10-CM | POA: Diagnosis present

## 2022-05-29 DIAGNOSIS — I82423 Acute embolism and thrombosis of iliac vein, bilateral: Secondary | ICD-10-CM | POA: Diagnosis not present

## 2022-05-29 DIAGNOSIS — L03116 Cellulitis of left lower limb: Secondary | ICD-10-CM | POA: Diagnosis not present

## 2022-05-29 DIAGNOSIS — Z79899 Other long term (current) drug therapy: Secondary | ICD-10-CM | POA: Diagnosis not present

## 2022-05-29 DIAGNOSIS — Z820 Family history of epilepsy and other diseases of the nervous system: Secondary | ICD-10-CM

## 2022-05-29 DIAGNOSIS — R7303 Prediabetes: Secondary | ICD-10-CM | POA: Diagnosis present

## 2022-05-29 DIAGNOSIS — I871 Compression of vein: Secondary | ICD-10-CM | POA: Diagnosis not present

## 2022-05-29 DIAGNOSIS — I10 Essential (primary) hypertension: Secondary | ICD-10-CM | POA: Diagnosis not present

## 2022-05-29 DIAGNOSIS — Z885 Allergy status to narcotic agent status: Secondary | ICD-10-CM | POA: Diagnosis not present

## 2022-05-29 DIAGNOSIS — Z981 Arthrodesis status: Secondary | ICD-10-CM

## 2022-05-29 DIAGNOSIS — K5900 Constipation, unspecified: Secondary | ICD-10-CM | POA: Diagnosis not present

## 2022-05-29 DIAGNOSIS — N3289 Other specified disorders of bladder: Secondary | ICD-10-CM | POA: Diagnosis not present

## 2022-05-29 DIAGNOSIS — C5700 Malignant neoplasm of unspecified fallopian tube: Secondary | ICD-10-CM | POA: Diagnosis present

## 2022-05-29 DIAGNOSIS — N2889 Other specified disorders of kidney and ureter: Secondary | ICD-10-CM | POA: Diagnosis not present

## 2022-05-29 DIAGNOSIS — G35D Multiple sclerosis, unspecified: Secondary | ICD-10-CM | POA: Diagnosis present

## 2022-05-29 DIAGNOSIS — C563 Malignant neoplasm of bilateral ovaries: Secondary | ICD-10-CM | POA: Diagnosis not present

## 2022-05-29 DIAGNOSIS — K219 Gastro-esophageal reflux disease without esophagitis: Secondary | ICD-10-CM | POA: Diagnosis present

## 2022-05-29 DIAGNOSIS — Z87442 Personal history of urinary calculi: Secondary | ICD-10-CM | POA: Diagnosis not present

## 2022-05-29 DIAGNOSIS — G35 Multiple sclerosis: Secondary | ICD-10-CM | POA: Diagnosis not present

## 2022-05-29 DIAGNOSIS — R609 Edema, unspecified: Secondary | ICD-10-CM | POA: Diagnosis not present

## 2022-05-29 DIAGNOSIS — K5909 Other constipation: Secondary | ICD-10-CM

## 2022-05-29 DIAGNOSIS — N9489 Other specified conditions associated with female genital organs and menstrual cycle: Secondary | ICD-10-CM | POA: Diagnosis present

## 2022-05-29 DIAGNOSIS — Z7951 Long term (current) use of inhaled steroids: Secondary | ICD-10-CM

## 2022-05-29 DIAGNOSIS — Z888 Allergy status to other drugs, medicaments and biological substances status: Secondary | ICD-10-CM

## 2022-05-29 DIAGNOSIS — C799 Secondary malignant neoplasm of unspecified site: Principal | ICD-10-CM

## 2022-05-29 DIAGNOSIS — J4489 Other specified chronic obstructive pulmonary disease: Secondary | ICD-10-CM | POA: Diagnosis not present

## 2022-05-29 DIAGNOSIS — Z539 Procedure and treatment not carried out, unspecified reason: Secondary | ICD-10-CM | POA: Diagnosis present

## 2022-05-29 DIAGNOSIS — Z8616 Personal history of COVID-19: Secondary | ICD-10-CM

## 2022-05-29 DIAGNOSIS — M7989 Other specified soft tissue disorders: Secondary | ICD-10-CM | POA: Diagnosis not present

## 2022-05-29 LAB — CBC WITH DIFFERENTIAL/PLATELET
Abs Immature Granulocytes: 0.01 10*3/uL (ref 0.00–0.07)
Basophils Absolute: 0 10*3/uL (ref 0.0–0.1)
Basophils Relative: 0 %
Eosinophils Absolute: 0.1 10*3/uL (ref 0.0–0.5)
Eosinophils Relative: 2 %
HCT: 38.6 % (ref 36.0–46.0)
Hemoglobin: 12.7 g/dL (ref 12.0–15.0)
Immature Granulocytes: 0 %
Lymphocytes Relative: 26 %
Lymphs Abs: 1.2 10*3/uL (ref 0.7–4.0)
MCH: 31.8 pg (ref 26.0–34.0)
MCHC: 32.9 g/dL (ref 30.0–36.0)
MCV: 96.5 fL (ref 80.0–100.0)
Monocytes Absolute: 1 10*3/uL (ref 0.1–1.0)
Monocytes Relative: 21 %
Neutro Abs: 2.2 10*3/uL (ref 1.7–7.7)
Neutrophils Relative %: 51 %
Platelets: 262 10*3/uL (ref 150–400)
RBC: 4 MIL/uL (ref 3.87–5.11)
RDW: 11.2 % — ABNORMAL LOW (ref 11.5–15.5)
WBC: 4.5 10*3/uL (ref 4.0–10.5)
nRBC: 0 % (ref 0.0–0.2)

## 2022-05-29 LAB — BASIC METABOLIC PANEL
Anion gap: 8 (ref 5–15)
BUN: 13 mg/dL (ref 8–23)
CO2: 29 mmol/L (ref 22–32)
Calcium: 8.7 mg/dL — ABNORMAL LOW (ref 8.9–10.3)
Chloride: 99 mmol/L (ref 98–111)
Creatinine, Ser: 0.75 mg/dL (ref 0.44–1.00)
GFR, Estimated: >60 mL/min (ref >60)
Glucose, Bld: 121 mg/dL — ABNORMAL HIGH (ref 70–99)
Potassium: 4 mmol/L (ref 3.5–5.1)
Sodium: 136 mmol/L (ref 135–145)

## 2022-05-29 MED ORDER — HYDROMORPHONE HCL 2 MG/ML IJ SOLN
2.0000 mg | Freq: Once | INTRAMUSCULAR | Status: AC
  Administered 2022-05-30: 2 mg via INTRAVENOUS
  Filled 2022-05-29: qty 1

## 2022-05-29 MED ORDER — IOHEXOL 350 MG/ML SOLN
100.0000 mL | Freq: Once | INTRAVENOUS | Status: AC | PRN
  Administered 2022-05-29: 100 mL via INTRAVENOUS

## 2022-05-29 MED ORDER — HYDROMORPHONE HCL 2 MG/ML IJ SOLN
2.0000 mg | Freq: Once | INTRAMUSCULAR | Status: AC
  Administered 2022-05-29: 2 mg via INTRAVENOUS
  Filled 2022-05-29: qty 1

## 2022-05-29 NOTE — ED Provider Triage Note (Signed)
Emergency Medicine Provider Triage Evaluation Note  [name] , a 67 y.o. female  was evaluated in triage.  Patient presenting today with pain and swelling to the bilateral lower extremities.  Left more than right.  She says been going on for 2 days.  Endorsing swelling and redness that has been spreading.  Does not remember any bug bites or trauma  Review of Systems  Positive:  Negative:   Physical Exam  BP (!) 146/79 (BP Location: Left Arm)   Pulse 96   Temp 97.7 F (36.5 C) (Oral)   Resp 18   SpO2 99%  Gen:   Awake, no distress   Resp:  Normal effort  MSK:   Moves extremities without difficulty  Other:  +DP bilat    Media Information    Medical Decision Making  Medically screening exam initiated at 4:43 PM.  Appropriate orders placed.  Londell Moh was informed that the remainder of the evaluation will be completed by another provider, this initial triage assessment does not replace that evaluation, and the importance of remaining in the ED until their evaluation is complete.     Rhae Hammock, [place] 05/29/22 1644

## 2022-05-29 NOTE — ED Triage Notes (Addendum)
BIBA from home with bilateral leg and foot swelling x2 days with redness and heat.  Left leg greater than right  Denies fall trauma.

## 2022-05-29 NOTE — ED Provider Notes (Signed)
[place] EM[facility]PARTMENT AT [facility] Provider Note   CSN: [id number] Arrival date & time: 05/29/22  1628     History  No chief complaint on file.   [name] is a 67 y.o. female.  HPI Patient presents with pain in her lower extremities.  More swelling for the last couple days.  She does not typically have swelling.  No redness.  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  Not on blood thinners.  No relief with her pain medicine at home.   Past Medical History:  Diagnosis Date   Anxiety    Asthma    Back problem    disc disease, chronic low back pain, right leg pain, s/p discectomy 99   Cancer (HCC)    ovarian cancer   Depression    Diastolic dysfunction    with elevated LVEDP at cath   Endometriosis    Fibroids    hx of    GERD (gastroesophageal reflux disease)    Hepatitis C    treated 8-9 years ago   Hidradenitis suppurativa    History of kidney stones    Hypertension    Neuromuscular disorder (HCC)    Mulitple sclerosis   Osteoarthritis    Prediabetes    RA (rheumatoid arthritis) ([place])    Rotator cuff tear    right shoulder    Syncope     Home Medications Prior to Admission medications   Medication Sig Start Date End Date Taking? Authorizing Provider  albuterol (VENTOLIN HFA) 108 (90 Base) MCG/ACT inhaler Inhale 2 puffs into the lungs every 6 (six) hours as needed for shortness of breath. 07/30/21   [provider]  amphetamine-dextroamphetamine (ADDERALL) 20 MG tablet Take 20 mg by mouth daily.    [provider]  Ascorbic Acid (VITAMIN C) 1000 MG tablet Take 1,000 mg by mouth once a week.    [provider]  ASHWAGANDHA PO Take 2 capsules by mouth daily.    [provider]  BLACK CURRANT SEED OIL PO Take 5 mLs by mouth daily.    [provider]  cyclobenzaprine (FLEXERIL) 5 MG tablet Take 5-10 mg by mouth 3 (three) times daily as needed for  muscle spasms.    [provider]  dexamethasone (DECADRON) 4 MG tablet Take 2 tabs at the night before and 2 tab the morning of chemotherapy, every 3 weeks, by mouth x 6 cycles 05/23/22   Heath Lark, MD  diclofenac Sodium (VOLTAREN) 1 % GEL Apply 2 g topically daily as needed (Hand pain). 08/12/19   [provider]  Fluticasone-Salmeterol (ADVAIR) 250-50 MCG/DOSE AEPB Inhale 1 puff into the lungs daily as needed (breathing.). Nellie    [provider]  hydroxychloroquine (PLAQUENIL) 200 MG tablet Take 200 mg by mouth 2 (two) times daily as needed (Arthritis).    [provider]  irbesartan (AVAPRO) 300 MG tablet Take 300 mg by mouth daily. 09/18/17   [provider]  lidocaine-prilocaine (EMLA) cream Apply to affected area once 05/23/22   Heath Lark, MD  LORazepam (ATIVAN) 0.5 MG tablet Take 0.5 mg by mouth every 8 (eight) hours as needed for anxiety.    [provider]  meclizine (ANTIVERT) 12.5 MG tablet Take 12.5 mg by mouth 3 (three) times daily as needed for dizziness. 08/02/21   [provider]  Melatonin 10 MG TABS Take 10 mg by mouth at bedtime as needed (sleep).  [provider]  Multiple Vitamin (MULTIVITAMIN WITH MINERALS) TABS tablet Take 1 tablet by mouth daily.    [provider]  mupirocin ointment (BACTROBAN) 2 % Apply 1 Application topically 3 (three) times daily as needed (boils). 08/02/21   [provider]  naloxone [facility]) nasal spray 4 mg/0.1 mL Place 1 spray into the nose as needed (opioid overdose).    [provider]  naproxen (NAPROSYN) 500 MG tablet Take 500 mg by mouth 2 (two) times daily as needed for pain. 07/16/19   [provider]  omeprazole (PRILOSEC) 20 MG capsule Take 20 mg by mouth daily as needed (acid reflux).    [provider]  ondansetron (ZOFRAN) 8 MG tablet Take 1 tablet (8 mg total) by mouth every 8 (eight) hours as needed for nausea or  vomiting. Start on the third day after chemotherapy. 05/23/22   Heath Lark, MD  OVER THE COUNTER MEDICATION Take 2 capsules by mouth daily. Sea moss supplement    [provider]  OVER THE COUNTER MEDICATION Take 2 capsules by mouth at bedtime. Liver focus otc supplement    [provider]  Oxycodone HCl 20 MG TABS Take 20 mg by mouth 4 (four) times daily as needed (pain).    [provider]  polyethylene glycol (MIRALAX / GLYCOLAX) 17 g packet Take 17 g by mouth daily as needed for moderate constipation.    [provider]  Potassium (POTASSIMIN PO) Take 1 tablet by mouth once a week.    [provider]  prochlorperazine (COMPAZINE) 10 MG tablet Take 1 tablet (10 mg total) by mouth every 6 (six) hours as needed for nausea or vomiting. 05/23/22   Heath Lark, MD  TURMERIC CURCUMIN PO Take 1,000 mg by mouth daily.    [provider]  VITAMIN A PALMITATE PO Take by mouth.    [provider]  VITAMIN A-BETA CAROTENE PO Take 2,400 Units by mouth once a week.    [provider]  Vitamin D, Ergocalciferol, (DRISDOL) 1.25 MG (50000 UNIT) CAPS capsule Take 50,000 Units by mouth every Sunday.    [provider]  XTAMPZA ER 9 MG C12A Take 9 mg by mouth 2 (two) times daily. 07/12/21   [provider]  zinc gluconate 50 MG tablet Take 50 mg by mouth once a week.    [provider]      Allergies    Cymbalta [duloxetine hcl], Cyclobenzaprine hcl, Celexa  [citalopram hydrobromide], Fluticasone furoate-vilanterol, Gabapentin, Opana  [oxymorphone hcl], and Tramadol    Review of Systems   Review of Systems  Physical Exam Updated Vital Signs BP 129/73   Pulse 90   Temp 97.7 F (36.5 C) (Oral)   Resp 20   Wt 78 kg   SpO2 99%   BMI 25.39 kg/m  Physical Exam Vitals reviewed.  Eyes:     Pupils: Pupils are equal, round, and reactive to light.  Cardiovascular:     Rate and Rhythm: Regular rhythm.  Pulmonary:      Breath sounds: No wheezing or rhonchi.  Abdominal:     Tenderness: There is abdominal tenderness.     Comments: Low abdominal tenderness without rebound or guarding.  Musculoskeletal:        General: Tenderness present.     Cervical back: Neck supple.     Comments: Edema of bilateral lower extremities.  Strictly from knee down.  Worse on left.  There is erythema bilaterally with warmth.  Neurological:  Mental Status: She is alert.     ED Results / Procedures / Treatments   Labs (all labs ordered are listed, but only abnormal results are displayed) Labs Reviewed  CBC WITH DIFFERENTIAL/PLATELET - Abnormal; Notable for the following components:      Result Value   RDW 11.2 (*)    All other components within normal limits  BASIC METABOLIC PANEL - Abnormal; Notable for the following components:   Glucose, Bld 121 (*)    Calcium 8.7 (*)    All other components within normal limits    EKG None  Radiology CT VENOGRAM ABDOMEN PELVIS  Result Date: 05/29/2022 CLINICAL DATA:  Bilateral leg and foot swelling x2 days. EXAM: CT VENOGRAM ABDOMEN AND PELVIS TECHNIQUE: Multi detector CT imaging of the abdomen and pelvis was performed using the standard protocol during bolus administration of intravenous contrast. Multiplanar CT image reconstructions and MIPS were obtained to evaluate the vascular anatomy. RADIATION DOSE REDUCTION: This exam was performed according to the departmental dose-optimization program which includes automated exposure control, adjustment of the mA and/or kV according to patient size and/or use of iterative reconstruction technique. CONTRAST:  11m OMNIPAQUE IOHEXOL 350 MG/ML SOLN COMPARISON:  April 26, 2022 FINDINGS: Lower chest: No acute abnormality. Hepatobiliary: No focal liver abnormality is seen. No gallstones, gallbladder wall thickening, or biliary dilatation. Pancreas: Unremarkable. No pancreatic ductal dilatation or surrounding inflammatory changes.  Spleen: Normal in size without focal abnormality. Adrenals/Urinary Tract: A 2.8 cm x 1.5 cm x 2.3 cm heterogeneous low-attenuation right adrenal mass is seen (approximately 85.23 Hounsfield units). The left adrenal gland is unremarkable. CT kidneys are normal in size, without focal lesions. 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 No renal calculi are identified. A stable 7 mm focal calcification is seen within the posterior aspect of the urinary bladder wall on the left (axial CT image 70, CT series 2). Stomach/Bowel: Stomach is within normal limits. The appendix is limited in visualization and appears normal (axial CT images 53 through 59, CT series 2. A large amount of stool is seen throughout the colon. No evidence of bowel dilatation or inflammation. Vascular/Lymphatic: Aortic atherosclerosis. No intraluminal filling defects are seen within the venous structures. There is marked severity compression of the distal aspect of the bilateral external iliac veins is noted (axial CT images 53 through 64, CT series 6). This is much more pronounced in severity when compared to the prior study. It should be noted that the vascular structures within the posterior aspect of the pelvis are limited in evaluation secondary to overlying streak artifact from adjacent operative hardware within the lumbar spine. No enlarged abdominal or pelvic lymph nodes. Reproductive: Multiple dilated and tortuous vessels are seen along the lateral aspects of an enlarged, heterogeneous, lobulated uterus. Complex cysts and surrounding soft tissue are again seen within the right adnexa. The largest complex cyst measures approximately 4.2 cm x 2.8 cm x 2.1 cm and contains a 1.3 cm x 1.5 cm x 1.6 cm heterogeneous soft tissue component (approximately 96.50 Hounsfield units). A 5.3 cm x 3.9 cm x 5.6 cm area of heterogeneous soft tissue attenuation is again seen  within the left adnexa and is indistinguishable from the adjacent uterus. Other: No abdominal wall hernia or abnormality. No abdominopelvic ascites. Multiple peritoneal soft tissue nodules are seen scattered throughout pelvis. These are present on the prior study. Musculoskeletal: A chronic compression fracture deformity is seen at the level  of L2. Postoperative changes are seen within the mid and lower lumbar spine. Associated streak artifact is seen with subsequently limited evaluation of the adjacent osseous and soft tissue structures. IMPRESSION: 1. Complex bilateral adnexal masses, as described above, consistent with the patient's known malignancy. 2. Subsequent marked severity mass effect on the distal bilateral common iliac veins, increased in severity when compared to the prior exam. 3. Additional findings consistent with peritoneal metastasis within the pelvis. 4. Findings consistent with pelvic congestion syndrome. 5. Postoperative changes within the mid and lower lumbar spine. 6. Aortic atherosclerosis. 7. No evidence of venous thrombus within the abdomen, pelvis or proximal bilateral lower extremities. Aortic Atherosclerosis (ICD10-I70.0). Electronically Signed   By: Virgina Norfolk M.D.   On: 05/29/2022 22:57   VAS [place] LOWER EXTREMITY VENOUS (DVT) (7a-7p)  Result Date: 05/29/2022  Lower Venous DVT Study Patient Name:  [name]  Date of Exam:   05/29/2022 Medical Rec #: [id number]        Accession #:    [id number] Date of Birth: Nov 27, 1955       Patient Gender: F Patient Age:   77 years Exam Location:  [facility] Procedure:      VAS [place] LOWER EXTREMITY VENOUS (DVT) Referring Phys: ADAM CURATOLO --------------------------------------------------------------------------------  Indications: Pain.  Comparison Study: no prior Performing Technologist: Archie Patten RVS  Examination Guidelines: A complete evaluation includes B-mode imaging, spectral Doppler, color Doppler, and power Doppler  as needed of all accessible portions of each vessel. Bilateral testing is considered an integral part of a complete examination. Limited examinations for reoccurring indications may be performed as noted. The reflux portion of the exam is performed with the patient in reverse Trendelenburg.  +-----+---------------+---------+-----------+----------+--------------+ RIGHTCompressibilityPhasicitySpontaneityPropertiesThrombus Aging +-----+---------------+---------+-----------+----------+--------------+ CFV  Full           Yes      Yes                                 +-----+---------------+---------+-----------+----------+--------------+   +---------+---------------+---------+-----------+----------+--------------+ LEFT     CompressibilityPhasicitySpontaneityPropertiesThrombus Aging +---------+---------------+---------+-----------+----------+--------------+ CFV      Full           Yes      Yes                                 +---------+---------------+---------+-----------+----------+--------------+ SFJ      Full                                                        +---------+---------------+---------+-----------+----------+--------------+ FV Prox  Full                                                        +---------+---------------+---------+-----------+----------+--------------+ FV Mid   Full                                                        +---------+---------------+---------+-----------+----------+--------------+  FV DistalFull                                                        +---------+---------------+---------+-----------+----------+--------------+ PFV      Full                                                        +---------+---------------+---------+-----------+----------+--------------+ POP      Full           Yes      Yes                                 +---------+---------------+---------+-----------+----------+--------------+  PTV      Full                                                        +---------+---------------+---------+-----------+----------+--------------+ PERO     Full           Yes      Yes                                 +---------+---------------+---------+-----------+----------+--------------+     Summary: RIGHT: - No evidence of common femoral vein obstruction.  LEFT: - There is no evidence of deep vein thrombosis in the lower extremity.  - No cystic structure found in the popliteal fossa.  *See table(s) above for measurements and observations. Electronically signed by Servando Snare MD on 05/29/2022 at 7:48:58 PM.    Final     Procedures Procedures    Medications Ordered in ED Medications  HYDROmorphone (DILAUDID) injection 2 mg (has no administration in time range)  HYDROmorphone (DILAUDID) injection 2 mg (2 mg Intravenous Given 05/29/22 2059)  iohexol (OMNIPAQUE) 350 MG/ML injection 100 mL (100 mLs Intravenous Contrast Given 05/29/22 2140)    ED Course/ Medical Decision Making/ A&P                             Medical Decision Making Amount and/or Complexity of Data Reviewed Radiology: ordered.  Risk Prescription drug management.   Patient with pain and swelling bilateral lower extremities.  Does have cancer which would increase her risk for DVT.  Dopplers done and negative.  History of diastolic dysfunction also.  However it is red and warm which would raise the likelihood of infection.  White count reassuring.  Will get CT venogram of the abdomen.  Pain medicine given.  Continued pain.  Required another narcotic dose for pain control.  Already on high-dose medicines at home.  Will require admission for pain control.  Also increasing erythema in the extremities.  Potentially infection although white count is normal.  CT scan done and does not show clot, however does show marked severity mass effect on the bilateral iliacs.  Will discuss with vascular surgery to see if any  intervention  would be possible.  Discussed with Dr. Donzetta Matters from vascular surgery.  No acute intervention to be done.  However elevation could help.  Also pressure stockings.           Final Clinical Impression(s) / ED Diagnoses Final diagnoses:  Metastatic malignant neoplasm, unspecified site [facility])  Peripheral edema  Cellulitis of lower extremity, unspecified laterality    Rx / DC Orders ED Discharge Orders     None         Davonna Belling, MD 05/29/22 2317

## 2022-05-29 NOTE — Progress Notes (Signed)
Lower extremity venous duplex has been completed.   Preliminary results in CV Proc.   [name] 05/29/2022 6:12 PM

## 2022-05-30 ENCOUNTER — Inpatient Hospital Stay: Payer: 59 | Admitting: Hematology and Oncology

## 2022-05-30 ENCOUNTER — Inpatient Hospital Stay: Payer: 59

## 2022-05-30 ENCOUNTER — Encounter (HOSPITAL_COMMUNITY): Payer: Self-pay | Admitting: Internal Medicine

## 2022-05-30 DIAGNOSIS — C563 Malignant neoplasm of bilateral ovaries: Secondary | ICD-10-CM

## 2022-05-30 DIAGNOSIS — R600 Localized edema: Secondary | ICD-10-CM | POA: Diagnosis present

## 2022-05-30 DIAGNOSIS — Z79891 Long term (current) use of opiate analgesic: Secondary | ICD-10-CM | POA: Insufficient documentation

## 2022-05-30 DIAGNOSIS — K5900 Constipation, unspecified: Secondary | ICD-10-CM | POA: Insufficient documentation

## 2022-05-30 DIAGNOSIS — I82429 Acute embolism and thrombosis of unspecified iliac vein: Secondary | ICD-10-CM | POA: Diagnosis not present

## 2022-05-30 DIAGNOSIS — G35 Multiple sclerosis: Secondary | ICD-10-CM

## 2022-05-30 DIAGNOSIS — K5909 Other constipation: Secondary | ICD-10-CM | POA: Insufficient documentation

## 2022-05-30 LAB — SEDIMENTATION RATE: Sed Rate: 43 mm/hr — ABNORMAL HIGH (ref 0–22)

## 2022-05-30 MED ORDER — SODIUM CHLORIDE 0.9 % IV SOLN
1.0000 g | INTRAVENOUS | Status: DC
  Administered 2022-05-30 – 2022-06-01 (×3): 1 g via INTRAVENOUS
  Filled 2022-05-30 (×3): qty 10

## 2022-05-30 MED ORDER — ONDANSETRON HCL 4 MG/2ML IJ SOLN
4.0000 mg | Freq: Four times a day (QID) | INTRAMUSCULAR | Status: DC | PRN
  Administered 2022-05-30: 4 mg via INTRAVENOUS
  Filled 2022-05-30: qty 2

## 2022-05-30 MED ORDER — POLYETHYLENE GLYCOL 3350 17 G PO PACK
17.0000 g | PACK | Freq: Every day | ORAL | Status: DC
  Administered 2022-05-30 – 2022-06-01 (×3): 17 g via ORAL
  Filled 2022-05-30 (×3): qty 1

## 2022-05-30 MED ORDER — HYDROMORPHONE HCL 1 MG/ML IJ SOLN
0.5000 mg | INTRAMUSCULAR | Status: DC | PRN
  Administered 2022-05-30 (×2): 1 mg via INTRAVENOUS
  Filled 2022-05-30 (×2): qty 1

## 2022-05-30 MED ORDER — APIXABAN 2.5 MG PO TABS
2.5000 mg | ORAL_TABLET | Freq: Two times a day (BID) | ORAL | Status: DC
  Administered 2022-05-30 – 2022-06-01 (×5): 2.5 mg via ORAL
  Filled 2022-05-30 (×5): qty 1

## 2022-05-30 MED ORDER — OXYCODONE HCL 5 MG PO TABS
20.0000 mg | ORAL_TABLET | Freq: Once | ORAL | Status: AC
  Administered 2022-05-30: 20 mg via ORAL
  Filled 2022-05-30: qty 4

## 2022-05-30 MED ORDER — ONDANSETRON HCL 4 MG PO TABS: 4.0000 mg | ORAL_TABLET | Freq: Four times a day (QID) | ORAL | Status: DC | PRN

## 2022-05-30 MED ORDER — OXYCODONE HCL 5 MG PO TABS
20.0000 mg | ORAL_TABLET | Freq: Four times a day (QID) | ORAL | Status: DC | PRN
  Administered 2022-05-30 – 2022-06-01 (×9): 20 mg via ORAL
  Filled 2022-05-30 (×9): qty 4

## 2022-05-30 MED ORDER — OXYCODONE HCL ER 10 MG PO T12A
10.0000 mg | EXTENDED_RELEASE_TABLET | Freq: Two times a day (BID) | ORAL | Status: DC
  Administered 2022-05-30 – 2022-06-01 (×6): 10 mg via ORAL
  Filled 2022-05-30 (×6): qty 1

## 2022-05-30 MED ORDER — HEPARIN SODIUM (PORCINE) 5000 UNIT/ML IJ SOLN
5000.0000 [IU] | Freq: Three times a day (TID) | INTRAMUSCULAR | Status: DC
  Administered 2022-05-30: 5000 [IU] via SUBCUTANEOUS
  Filled 2022-05-30: qty 1

## 2022-05-30 MED ORDER — ACETAMINOPHEN 325 MG PO TABS: 650.0000 mg | ORAL_TABLET | Freq: Four times a day (QID) | ORAL | Status: DC | PRN

## 2022-05-30 MED ORDER — SENNOSIDES-DOCUSATE SODIUM 8.6-50 MG PO TABS
1.0000 | ORAL_TABLET | Freq: Two times a day (BID) | ORAL | Status: DC
  Administered 2022-05-30 – 2022-06-01 (×5): 1 via ORAL
  Filled 2022-05-30 (×5): qty 1

## 2022-05-30 MED ORDER — LORAZEPAM 0.5 MG PO TABS
0.5000 mg | ORAL_TABLET | Freq: Three times a day (TID) | ORAL | Status: DC | PRN
  Administered 2022-05-30 – 2022-05-31 (×3): 0.5 mg via ORAL
  Filled 2022-05-30 (×3): qty 1

## 2022-05-30 MED ORDER — ACETAMINOPHEN 650 MG RE SUPP: 650.0000 mg | Freq: Four times a day (QID) | RECTAL | Status: DC | PRN

## 2022-05-30 NOTE — ED Notes (Signed)
Pt daughter Barnett Applebaum, wishes to be updated when pt gets to inpatient room.

## 2022-05-30 NOTE — Progress Notes (Signed)
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 I recommend prophylactic anticoagulation therapy with low-dose Eliquis due to high risk of thrombotic event We discussed the risk, benefits, side effects of Eliquis and she is in agreement to proceed  Lower extremity cellulitis I have discussed this with primary service We will start her on IV antibiotics and monitor If she improves by tomorrow or the next day, she can be discharged with oral antibiotics  Moderate constipation She is in agreement for aggressive laxative regimen  Goals of care I am hopeful she can be discharged after resolution of cellulitis and constipation  Discharge planning Hopefully in the next 24 to 48 hours  All questions were answered. The patient knows to call the clinic with any problems, questions or concerns.   The total time spent in the appointment was 80 minutes encounter with patients including review of chart and various tests results, discussions about plan of care and coordination of care plan  Heath Lark, MD 05/30/2022 10:02 AM  Subjective:  I was notified of her admission Starting around Sunday, she have difficulties walking due to lower extremity swelling and pain She is also somewhat constipated and have abdominal bloating She is scheduled to see me in the outpatient clinic today and chemotherapy to start on Friday She has difficulties walking due to swelling and pain  Oncology History Overview Note  High grade serous   Ovarian cancer, bilateral ([place])  04/11/2022  Imaging   CT of the abdomen and pelvis on 04/11/2022 reveals a left adnexal masslike area measuring 5.9 x 3.6 cm.  Margins of this mass are irregular.  There is small fluid in the cul-de-sac as well as fullness of soft tissue about the right adnexa.  Discrete uterine structure is not identified.  Left upper quadrant nodules beneath the left hemidiaphragm (1 anterior to the spleen and the other to the stomach).  Omental/peritoneal nodularity and multiple perihepatic nodules are noted    04/18/2022 Tumor Marker   Patient's tumor was tested for the following markers: CA-125. Results of the tumor marker test revealed 123.   04/19/2022 Initial Diagnosis   Carcinomatosis ([place])   04/28/2022 Imaging   1. Bulky bilateral ovarian masses and nodules, left-greater-than-right, which are essentially confluent with adjacent pelvic soft tissue nodularity and not significantly changed compared to prior examination dated 04/11/2022. 2. Extensive pelvic peritoneal thickening and nodularity. Multiple small peritoneal nodules throughout the abdomen and pelvis. Findings are consistent with peritoneal metastatic disease and likewise not significantly changed. 3. Small volume of loculated appearing fluid in the low pelvis. 4. 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 The mid to distal right ureter is obstructed by right ovarian mass and or soft tissue nodularity. 5. Calculus within the most distal left ureterovesicular junction or just within the bladder lumen measuring 0.7 cm, unchanged. No associated left-sided hydronephrosis. 6. Soft tissue attenuation nodule of the body of the right adrenal gland. Notably, this was also soft tissue attenuation on prior noncontrast examination (i.e. not  definitively macroscopic fat containing) although unchanged compared to prior examinations dating back to 6712 and almost certainly a  benign adenoma. Attention on follow-up. 7. No evidence of metastatic disease in the chest. 8. Mild diffuse bilateral bronchial wall thickening. Background of very fine centrilobular nodularity, most concentrated in the lung apices. Findings are most consistent with smoking-related respiratory bronchiolitis.     05/18/2022 Procedure   Ultrasound-guided core biopsy performed of a 10 mm soft tissue peritoneal mass in the right upper quadrant just superficial to the right lobe of the liver. The procedure was performed under general anesthesia immediately following Port-A-Cath placement.   05/18/2022 Procedure   Placement of single lumen port a cath via right internal jugular vein. The catheter tip lies at the cavo-atrial junction. A power injectable port a cath was placed and is ready for immediate use.     05/18/2022 Pathology Results   A. 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  The findings support the above interpretation.  This case was reviewed with Dr. Vic Ripper who agrees with the above diagnosis.  A p16 stain is pending and will be reported in an addendum.    05/23/2022 Cancer Staging   Staging form: Ovary, Fallopian Tube, and Primary Peritoneal Carcinoma, AJCC 8th Edition - Clinical stage from 05/23/2022: FIGO Stage IIIC (cT3c, cN0, cM0) - Signed by Heath Lark, MD on 05/23/2022 Stage prefix: Initial diagnosis   06/02/2022 -  Chemotherapy   Patient is on Treatment Plan : OVARIAN Carboplatin (AUC 6) + Paclitaxel (175) q21d X 6 Cycles        Objective:  Vitals:   05/30/22 0645 05/30/22 0651  BP: 123/70   Pulse: 85   Resp: 18   Temp:  98 F (36.7 C)  SpO2: 96%     No intake or output data in the 24 hours ending 05/30/22 1002  GENERAL:alert, no distress and comfortable SKIN: She has bilateral lower extremity  redness compatible with signs of cellulitis, warm to touch EYES: normal, Conjunctiva are pink and non-injected, sclera clear OROPHARYNX:no exudate, no erythema and lips, buccal mucosa, and tongue normal  NECK: supple, thyroid normal size, non-tender, without nodularity LYMPH:  no palpable lymphadenopathy in the cervical, axillary or inguinal LUNGS: clear to auscultation and percussion with normal breathing effort HEART: regular rate & rhythm and no murmurs with moderate bilateral lower extremity edema ABDOMEN:abdomen soft, non-tender and normal bowel sounds Musculoskeletal:no cyanosis of digits and no clubbing  NEURO: alert & oriented x 3 with fluent speech, no focal motor/sensory deficits   Labs:  Recent Labs    04/18/22 1126 05/16/22 0951 05/29/22 1712  NA 141 139 136  K 4.1 4.2 4.0  CL 105 107 99  CO2 '28 24 29  '$ GLUCOSE 96 117* 121*  BUN '22 17 13  '$ CREATININE 0.85 0.91 0.75  CALCIUM 10.1 9.0 8.7*  GFRNONAA >60 >60 >60  PROT  --  7.3  --   ALBUMIN  --  3.8  --   AST  --  17  --   ALT  --  20  --   ALKPHOS  --  81  --   BILITOT  --  0.3  --     Studies: I have personally reviewed his CT imaging CT VENOGRAM ABDOMEN PELVIS  Result Date: 05/29/2022 CLINICAL DATA:  Bilateral leg and foot swelling x2 days.  EXAM: CT VENOGRAM ABDOMEN AND PELVIS TECHNIQUE: Multi detector CT imaging of the abdomen and pelvis was performed using the standard protocol during bolus administration of intravenous contrast. Multiplanar CT image reconstructions and MIPS were obtained to evaluate the vascular anatomy. RADIATION DOSE REDUCTION: This exam was performed according to the departmental dose-optimization program which includes automated exposure control, adjustment of the mA and/or kV according to patient size and/or use of iterative reconstruction technique. CONTRAST:  146m OMNIPAQUE IOHEXOL 350 MG/ML SOLN COMPARISON:  April 26, 2022 FINDINGS: Lower chest: No acute abnormality. Hepatobiliary: No  focal liver abnormality is seen. No gallstones, gallbladder wall thickening, or biliary dilatation. Pancreas: Unremarkable. No pancreatic ductal dilatation or surrounding inflammatory changes. Spleen: Normal in size without focal abnormality. Adrenals/Urinary Tract: A 2.8 cm x 1.5 cm x 2.3 cm heterogeneous low-attenuation right adrenal mass is seen (approximately 85.23 Hounsfield units). The left adrenal gland is unremarkable. CT kidneys are normal in size, without focal lesions. 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 No renal calculi are identified. A stable 7 mm focal calcification is seen within the posterior aspect of the urinary bladder wall on the left (axial CT image 70, CT series 2). Stomach/Bowel: Stomach is within normal limits. The appendix is limited in visualization and appears normal (axial CT images 53 through 59, CT series 2. A large amount of stool is seen throughout the colon. No evidence of bowel dilatation or inflammation. Vascular/Lymphatic: Aortic atherosclerosis. No intraluminal filling defects are seen within the venous structures. There is marked severity compression of the distal aspect of the bilateral external iliac veins is noted (axial CT images 53 through 64, CT series 6). This is much more pronounced in severity when compared to the prior study. It should be noted that the vascular structures within the posterior aspect of the pelvis are limited in evaluation secondary to overlying streak artifact from adjacent operative hardware within the lumbar spine. No enlarged abdominal or pelvic lymph nodes. Reproductive: Multiple dilated and tortuous vessels are seen along the lateral aspects of an enlarged, heterogeneous, lobulated uterus. Complex cysts and surrounding soft tissue are again seen within the right adnexa. The largest complex cyst measures approximately 4.2 cm x 2.8 cm x 2.1 cm and contains  a 1.3 cm x 1.5 cm x 1.6 cm heterogeneous soft tissue component (approximately 96.50 Hounsfield units). A 5.3 cm x 3.9 cm x 5.6 cm area of heterogeneous soft tissue attenuation is again seen within the left adnexa and is indistinguishable from the adjacent uterus. Other: No abdominal wall hernia or abnormality. No abdominopelvic ascites. Multiple peritoneal soft tissue nodules are seen scattered throughout pelvis. These are present on the prior study. Musculoskeletal: A chronic compression fracture deformity is seen at the level of L2. Postoperative changes are seen within the mid and lower lumbar spine. Associated streak artifact is seen with subsequently limited evaluation of the adjacent osseous and soft tissue structures. IMPRESSION: 1. Complex bilateral adnexal masses, as described above, consistent with the patient's known malignancy. 2. Subsequent marked severity mass effect on the distal bilateral common iliac veins, increased in severity when compared to the prior exam. 3. Additional findings consistent with peritoneal metastasis within the pelvis. 4. Findings consistent with pelvic congestion syndrome. 5. Postoperative changes within the mid and lower lumbar spine. 6. Aortic atherosclerosis. 7. No evidence of venous thrombus within the abdomen, pelvis or proximal bilateral lower extremities. Aortic Atherosclerosis (ICD10-I70.0). Electronically Signed   By:  Virgina Norfolk M.D.   On: 05/29/2022 22:57   VAS [place] LOWER EXTREMITY VENOUS (DVT) (7a-7p)  Result Date: 05/29/2022  Lower Venous DVT Study Patient Name:  ARIEAL CUOCO  Date of Exam:   05/29/2022 Medical Rec #: [id number]        Accession #:    [id number] Date of Birth: January 29, 1956       Patient Gender: F Patient Age:   67 years Exam Location:  [facility] Procedure:      VAS [place] LOWER EXTREMITY VENOUS (DVT) Referring Phys: ADAM CURATOLO --------------------------------------------------------------------------------  Indications: Pain.   Comparison Study: no prior Performing Technologist: Archie Patten RVS  Examination Guidelines: A complete evaluation includes B-mode imaging, spectral Doppler, color Doppler, and power Doppler as needed of all accessible portions of each vessel. Bilateral testing is considered an integral part of a complete examination. Limited examinations for reoccurring indications may be performed as noted. The reflux portion of the exam is performed with the patient in reverse Trendelenburg.  +-----+---------------+---------+-----------+----------+--------------+ RIGHTCompressibilityPhasicitySpontaneityPropertiesThrombus Aging +-----+---------------+---------+-----------+----------+--------------+ CFV  Full           Yes      Yes                                 +-----+---------------+---------+-----------+----------+--------------+   +---------+---------------+---------+-----------+----------+--------------+ LEFT     CompressibilityPhasicitySpontaneityPropertiesThrombus Aging +---------+---------------+---------+-----------+----------+--------------+ CFV      Full           Yes      Yes                                 +---------+---------------+---------+-----------+----------+--------------+ SFJ      Full                                                        +---------+---------------+---------+-----------+----------+--------------+ FV Prox  Full                                                        +---------+---------------+---------+-----------+----------+--------------+ FV Mid   Full                                                        +---------+---------------+---------+-----------+----------+--------------+ FV DistalFull                                                        +---------+---------------+---------+-----------+----------+--------------+ PFV      Full                                                         +---------+---------------+---------+-----------+----------+--------------+  POP      Full           Yes      Yes                                 +---------+---------------+---------+-----------+----------+--------------+ PTV      Full                                                        +---------+---------------+---------+-----------+----------+--------------+ PERO     Full           Yes      Yes                                 +---------+---------------+---------+-----------+----------+--------------+     Summary: RIGHT: - No evidence of common femoral vein obstruction.  LEFT: - There is no evidence of deep vein thrombosis in the lower extremity.  - No cystic structure found in the popliteal fossa.  *See table(s) above for measurements and observations. Electronically signed by Servando Snare MD on 05/29/2022 at 7:48:58 PM.    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 Failed attempt at CT-guided biopsy under conscious sedation on 05/09/2022 for due to patient anxiety, pain and rapid respiratory rate. EXAM: ULTRASOUND GUIDED CORE BIOPSY OF PERITONEAL MASS MEDICATIONS: The patient was under general anesthesia and the biopsy procedure immediately followed a port placement. PROCEDURE: The procedure, risks, benefits, and alternatives were explained to the patient. Questions regarding the procedure were encouraged and answered. The patient understands and consents to the procedure. A time out was performed prior to initiating the procedure. The right abdominal wall was prepped with chlorhexidine in a sterile fashion, and a sterile drape was applied covering the operative field. A sterile gown and sterile gloves were used for the procedure. Ultrasound was used to localize a peritoneal mass in the right upper abdomen. Under ultrasound guidance, a 17 gauge trocar needle was advanced to the level of  the mass. Three separate coaxial 18 gauge core biopsy samples were then obtained and submitted in formalin. Additional post biopsy ultrasound was performed. COMPLICATIONS: None. FINDINGS: Hypoechoic rounded soft tissue mass is identified in the anterior right upper quadrant just superficial to the right lobe of the liver. This soft tissue nodule measures approximately 8 x 10 mm by ultrasound. Solid tissue was obtained with core biopsy. IMPRESSION: Ultrasound-guided core biopsy performed of a 10 mm soft tissue peritoneal mass in the right upper quadrant just superficial to the right lobe of the liver. The procedure was performed under general anesthesia immediately following Port-A-Cath placement. Electronically Signed   By: Aletta Edouard M.D.   On: 05/18/2022 11:47   IR IMAGING GUIDED PORT INSERTION  Result Date: 05/18/2022 CLINICAL DATA:  Left ovarian mass and peritoneal carcinomatosis. Need for porta cath for chemotherapy. EXAM: IMPLANTED PORT A CATH PLACEMENT WITH ULTRASOUND AND FLUOROSCOPIC GUIDANCE ANESTHESIA/SEDATION: General anesthesia FLUOROSCOPY: 22 seconds.  1.0 mGy. PROCEDURE: The procedure, risks, benefits, and alternatives were explained to the patient. Questions regarding the procedure were encouraged and answered. The patient understands and consents to the procedure. A  time-out was performed prior to initiating the procedure. Ultrasound was utilized to confirm patency of the right internal jugular vein. A permanent ultrasound image was recorded and saved. The right neck and chest were prepped with chlorhexidine in a sterile fashion, and a sterile drape was applied covering the operative field. Maximum barrier sterile technique with sterile gowns and gloves were used for the procedure. Local anesthesia was provided with 1% lidocaine. After creating a small venotomy incision, a 21 gauge needle was advanced into the right internal jugular vein under direct, real-time ultrasound guidance.  Ultrasound image documentation was performed. After securing guidewire access, an 8 Fr dilator was placed. A J-wire was kinked to measure appropriate catheter length. A subcutaneous port pocket was then created along the upper chest wall utilizing sharp and blunt dissection. Portable cautery was utilized. The pocket was irrigated with sterile saline. A single lumen power injectable port was chosen for placement. The 8 Fr catheter was tunneled from the port pocket site to the venotomy incision. The port was placed in the pocket. External catheter was trimmed to appropriate length based on guidewire measurement. At the venotomy, an 8 Fr peel-away sheath was placed over a guidewire. The catheter was then placed through the sheath and the sheath removed. Final catheter positioning was confirmed and documented with a fluoroscopic spot image. The port was accessed with a needle and aspirated and flushed with heparinized saline. The access needle was removed. The venotomy and port pocket incisions were closed with subcutaneous 3-0 Monocryl and subcuticular 4-0 Vicryl. Dermabond was applied to both incisions. COMPLICATIONS: COMPLICATIONS None FINDINGS: After catheter placement, the tip lies at the cavo-atrial junction. The catheter aspirates normally and is ready for immediate use. IMPRESSION: Placement of single lumen port a cath via right internal jugular vein. The catheter tip lies at the cavo-atrial junction. A power injectable port a cath was placed and is ready for immediate use. Electronically Signed   By: Aletta Edouard M.D.   On: 05/18/2022 11:25   CT BIOPSY  Result Date: 05/09/2022 INDICATION: 67 year old with bulky peritoneal disease in the pelvis and scattered peritoneal nodules in the abdomen. Patient needs a tissue diagnosis. EXAM: ATTEMPTED IMAGE GUIDED BIOPSY OF ABDOMINAL PERITONEAL NODULE Physician: Stephan Minister. Henn, MD MEDICATIONS: Moderate sedation ANESTHESIA/SEDATION: Moderate (conscious) sedation was  employed during this procedure. A total of Versed '3mg'$  and fentanyl 150 mcg was administered intravenously at the order of the provider performing the procedure. Total intra-service moderate sedation time: 41 minutes. Patient's level of consciousness and vital signs were monitored continuously by radiology nurse throughout the procedure under the supervision of the provider performing the procedure. COMPLICATIONS: None immediate. PROCEDURE: The procedure was explained to the patient. The risks and benefits of the procedure were discussed and the patient's questions were addressed. Informed consent was obtained from the patient. Patient was placed supine on the CT scanner. Images through the abdomen are obtained. A peritoneal nodule anterior to the right hepatic lobe was felt to be a safe site for biopsy. This nodule was also visible using ultrasound. The right upper abdomen was prepped with chlorhexidine and sterile field was created. Skin was anesthetized using 1% lidocaine. Unfortunately, the patient was unable to get comfortable throughout the procedure and she was experiencing pain despite moderate sedation. Skin was anesthetized using 1% lidocaine. A small incision was made. Using ultrasound guidance, attempted to direct a 17 gauge coaxial needle into the small nodule in the perihepatic space. Patient had significant pain and discomfort when the needle  was advanced into the peritoneal cavity. Attempted fine-needle aspiration with a 22 gauge needle but this was also unsuccessful due to patient's significant breathing and pain discomfort. As a result, the procedure was aborted. Bandage was placed at the puncture site. RADIATION DOSE REDUCTION: This exam was performed according to the departmental dose-optimization program which includes automated exposure control, adjustment of the mA and/or kV according to patient size and/or use of iterative reconstruction technique. FINDINGS: CT images of the abdomen  demonstrate multiple small peritoneal nodules in the anterior abdomen. 1.0 cm nodule just anterior to the right hepatic lobe was targeted and was visible with ultrasound. Despite adequate visualization, needle could not be successfully advanced into this nodule due to patient's significant pain and heavy breathing. A biopsy was not obtained. IMPRESSION: Unsuccessful biopsy of an anterior peritoneal nodule. Repeat biopsy will likely require anesthesia assistance and/or laparoscopic approach. Electronically Signed   By: Markus Daft M.D.   On: 05/09/2022 17:20

## 2022-05-30 NOTE — Subjective & Objective (Signed)
CC: bilateral leg edema HPI: 67 year old female with a history of ovarian cancer, peritoneal carcinomatosis, history of MS, presents to the ER today with worsening lower extremity edema, pain of her bilateral lower extremities.  She denies any fever.  She has been taking OxyContin and Xtampza at home without any relief.  She has had increased erythema of her legs.  She is scheduled to start chemotherapy this Friday.  She is ready had a Port-A-Cath placed.  On arrival temp 97.7 heart rate 96 blood pressure 146 over 7979% on room air.  Labs showed white count 4.5, hemoglobin 12.7, platelet 262  Sodium 136, potassium 4.0, BUN of 13, creatinine 0.75  CT venogram demonstrates large bilateral adnexal masses consistent with her known ovarian cancer.  These masses also affect the distal bilateral common iliac veins causing external compression.  This is much more pronounced compared to her prior study dated 04/26/2022.  There were no venous thrombus is noted in the abdomen pelvis or proximal bilateral lower extremities.  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  Due to increased pain, [facility] hospitalist contacted for admission.

## 2022-05-30 NOTE — Progress Notes (Signed)
    Patient: [name] RCV:[id number] DOB: [birth year]      Brief hospital course: [name] is a 67 y.o. 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    1/23: Admitted for Oncology consultation, Oncology recommended antibiotics for bilateral cellulitis    This is a no charge note, for further details, please see the H&P by my partner, Dr. Bridgett Larsson from earlier today.    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  Discussed with Oncology and GYN Oncology, who wanted to trial antibiotics for possible cellulitis.        Physical Exam: BP 126/78   Pulse 86   Temp 98.1 F (36.7 C)   Resp 13   Wt 78 kg   SpO2 95%   BMI 25.39 kg/m      Family Communication: None present        Author: Edwin Dada, MD 05/30/2022 4:12 PM

## 2022-05-30 NOTE — H&P (Addendum)
History and Physical    MITTIE KNITTEL NLZ:[id number] DOB: 06/02/1955 DOA: 05/29/2022  DOS: the patient was seen and examined on 05/29/2022  PCP: Shirline Frees, MD   Patient coming from: Home  I have personally briefly reviewed patient's old medical records in [place]  CC: bilateral leg edema HPI: 67 year old female with a history of ovarian cancer, peritoneal carcinomatosis, history of MS, presents to the ER today with worsening lower extremity edema, pain of her bilateral lower extremities.  She denies any fever.  She has been taking OxyContin and Xtampza at home without any relief.  She has had increased erythema of her legs.  She is scheduled to start chemotherapy this Friday.  She is ready had a Port-A-Cath placed.  On arrival temp 97.7 heart rate 96 blood pressure 146 over 7979% on room air.  Labs showed white count 4.5, hemoglobin 12.7, platelet 262  Sodium 136, potassium 4.0, BUN of 13, creatinine 0.75  CT venogram demonstrates large bilateral adnexal masses consistent with her known ovarian cancer.  These masses also affect the distal bilateral common iliac veins causing external compression.  This is much more pronounced compared to her prior study dated 04/26/2022.  There were no venous thrombus is noted in the abdomen pelvis or proximal bilateral lower extremities.  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  Due to increased pain, [facility] hospitalist contacted for admission.   ED Course: CT abd shows external iliac compression by lymph nodes and tumor.  Review of Systems:  Review of Systems  Constitutional: Negative.   HENT: Negative.    Eyes: Negative.   Cardiovascular:  Positive for leg swelling.  Gastrointestinal: Negative.   Genitourinary: Negative.   Musculoskeletal:  Positive for myalgias.  Skin: Negative.   Neurological:  Positive for weakness.  Endo/Heme/Allergies: Negative.   Psychiatric/Behavioral: Negative.    All other  systems reviewed and are negative.   Past Medical History:  Diagnosis Date   Anxiety    Asthma    Back problem    disc disease, chronic low back pain, right leg pain, s/p discectomy 99   Cancer (HCC)    ovarian cancer   Depression    Diastolic dysfunction    with elevated LVEDP at cath   Endometriosis    Fibroids    hx of    GERD (gastroesophageal reflux disease)    Hepatitis C    treated 8-9 years ago   Hidradenitis suppurativa    History of kidney stones    Hypertension    Neuromuscular disorder (HCC)    Mulitple sclerosis   Osteoarthritis    Prediabetes    RA (rheumatoid arthritis) ([place])    Rotator cuff tear    right shoulder    Syncope     Past Surgical History:  Procedure Laterality Date   ANGIOPLASTY     BACK SURGERY     lumbar surgery x 2 done in [place] and [place]   COLONOSCOPY  07/2010   tubular adenoma   COLONOSCOPY  08/2015   DISKECTOMY     IR IMAGING GUIDED PORT INSERTION  05/18/2022   IR US GUIDE BX ASP/DRAIN  05/18/2022   laparoscopy for endometriosis     left shoulder scope     RADIOLOGY WITH ANESTHESIA N/A 05/18/2022   Procedure: IR WITH ANESTHESIA PORT AND BIOPSY;  Surgeon: Aletta Edouard, MD;  Location: [facility];  Service: Radiology;  Laterality: N/A;   ROTATOR CUFF REPAIR Left    Spinal Fusion  12/2016   L4-S1     reports that she quit smoking about 2 years ago. Her smoking use included cigarettes. She smoked an average of .1 packs per day. She has never used smokeless tobacco. She reports current alcohol use. She reports current drug use. Drug: Marijuana.  Allergies  Allergen Reactions   Cymbalta [Duloxetine Hcl] Shortness Of Breath and Swelling    Tongue and leg swelling   Cyclobenzaprine Hcl Other (See Comments)    Immobility, tolerates in small doses    Celexa  [Citalopram Hydrobromide]     Tongue swelling   Fluticasone Furoate-Vilanterol Swelling and Other (See Comments)   Gabapentin     Feels awful when taking, drowsy     Opana  [Oxymorphone Hcl] Itching    hairloss   Tramadol     dizziness    Family History  Problem Relation Age of Onset   Heart disease Mother        Died with MI 70, chest pain 10s   Hypertension Mother    Pancreatitis Father    Atrial fibrillation Sister    Breast cancer Maternal Aunt    Multiple sclerosis Cousin     Prior to Admission medications   Medication Sig Start Date End Date Taking? Authorizing Provider  albuterol (VENTOLIN HFA) 108 (90 Base) MCG/ACT inhaler Inhale 2 puffs into the lungs every 6 (six) hours as needed for shortness of breath. 07/30/21   [provider]  amphetamine-dextroamphetamine (ADDERALL) 20 MG tablet Take 20 mg by mouth daily.    [provider]  Ascorbic Acid (VITAMIN C) 1000 MG tablet Take 1,000 mg by mouth once a week.    [provider]  ASHWAGANDHA PO Take 2 capsules by mouth daily.    [provider]  BLACK CURRANT SEED OIL PO Take 5 mLs by mouth daily.    [provider]  cyclobenzaprine (FLEXERIL) 5 MG tablet Take 5-10 mg by mouth 3 (three) times daily as needed for muscle spasms.    [provider]  dexamethasone (DECADRON) 4 MG tablet Take 2 tabs at the night before and 2 tab the morning of chemotherapy, every 3 weeks, by mouth x 6 cycles 05/23/22   Heath Lark, MD  diclofenac Sodium (VOLTAREN) 1 % GEL Apply 2 g topically daily as needed (Hand pain). 08/12/19   [provider]  Fluticasone-Salmeterol (ADVAIR) 250-50 MCG/DOSE AEPB Inhale 1 puff into the lungs daily as needed (breathing.). [place]    [provider]  hydroxychloroquine (PLAQUENIL) 200 MG tablet Take 200 mg by mouth 2 (two) times daily as needed (Arthritis).    [provider]  irbesartan (AVAPRO) 300 MG tablet Take 300 mg by mouth daily. 09/18/17   [provider]  lidocaine-prilocaine (EMLA) cream Apply to affected area once 05/23/22   Heath Lark, MD  LORazepam (ATIVAN) 0.5 MG tablet Take 0.5 mg  by mouth every 8 (eight) hours as needed for anxiety.    [provider]  meclizine (ANTIVERT) 12.5 MG tablet Take 12.5 mg by mouth 3 (three) times daily as needed for dizziness. 08/02/21   [provider]  Melatonin 10 MG TABS Take 10 mg by mouth at bedtime as needed (sleep).    [provider]  Multiple Vitamin (MULTIVITAMIN WITH MINERALS) TABS tablet Take 1 tablet by mouth daily.    [provider]  mupirocin ointment (BACTROBAN) 2 % Apply 1 Application topically 3 (three) times daily as needed (boils). 08/02/21   [provider]  naloxone (NARCAN) nasal spray 4 mg/0.1 mL Place 1 spray into the nose as needed (opioid overdose).    [provider]  naproxen (NAPROSYN) 500 MG tablet Take 500 mg by mouth 2 (two) times daily as needed for pain. 07/16/19   [provider]  omeprazole (PRILOSEC) 20 MG capsule Take 20 mg by mouth daily as needed (acid reflux).    [provider]  ondansetron (ZOFRAN) 8 MG tablet Take 1 tablet (8 mg total) by mouth every 8 (eight) hours as needed for nausea or vomiting. Start on the third day after chemotherapy. 05/23/22   Heath Lark, MD  OVER THE COUNTER MEDICATION Take 2 capsules by mouth daily. Sea moss supplement    [provider]  OVER THE COUNTER MEDICATION Take 2 capsules by mouth at bedtime. Liver focus otc supplement    [provider]  Oxycodone HCl 20 MG TABS Take 20 mg by mouth 4 (four) times daily as needed (pain).    [provider]  polyethylene glycol (MIRALAX / GLYCOLAX) 17 g packet Take 17 g by mouth daily as needed for moderate constipation.    [provider]  Potassium (POTASSIMIN PO) Take 1 tablet by mouth once a week.    [provider]  prochlorperazine (COMPAZINE) 10 MG tablet Take 1 tablet (10 mg total) by mouth every 6 (six) hours as needed for nausea or vomiting. 05/23/22   Heath Lark, MD  TURMERIC CURCUMIN PO Take 1,000 mg by mouth  daily.    [provider]  VITAMIN A PALMITATE PO Take by mouth.    [provider]  VITAMIN A-BETA CAROTENE PO Take 2,400 Units by mouth once a week.    [provider]  Vitamin D, Ergocalciferol, (DRISDOL) 1.25 MG (50000 UNIT) CAPS capsule Take 50,000 Units by mouth every Sunday.    [provider]  XTAMPZA ER 9 MG C12A Take 9 mg by mouth 2 (two) times daily. 07/12/21   [provider]  zinc gluconate 50 MG tablet Take 50 mg by mouth once a week.    [provider]    Physical Exam: Vitals:   05/29/22 2101 05/29/22 2200 05/30/22 0100 05/30/22 0130  BP: 133/87 129/73 109/76 (!) 132/97  Pulse: 90 90 80   Resp: 20  18   Temp:      TempSrc:      SpO2: 100% 99% 98%   Weight:        Physical Exam Vitals and nursing note reviewed.  Constitutional:      General: She is not in acute distress.    Appearance: She is not ill-appearing, toxic-appearing or diaphoretic.  HENT:     Head: Normocephalic and atraumatic.     Nose: Nose normal.  Cardiovascular:     Rate and Rhythm: Normal rate and regular rhythm.  Pulmonary:     Effort: Pulmonary effort is normal.  Abdominal:     General: Bowel sounds are normal.     Palpations: Abdomen is soft. There is mass.     Tenderness: There is no abdominal tenderness.  Musculoskeletal:     Right lower leg: Edema present.     Left lower leg: Edema present.     Comments: See pictures of lower legs edema and erythema  Skin:    General: Skin is warm and dry.     Capillary Refill: Capillary refill takes less than 2 seconds.  Neurological:     General: No focal deficit present.  Mental Status: She is alert and oriented to person, place, and time.               Labs on Admission: I have personally reviewed following labs and imaging studies  CBC: Recent Labs  Lab 05/29/22 1712  WBC 4.5  NEUTROABS 2.2  HGB 12.7  HCT 38.6  MCV 96.5  PLT 093   Basic Metabolic Panel: Recent Labs   Lab 05/29/22 1712  NA 136  K 4.0  CL 99  CO2 29  GLUCOSE 121*  BUN 13  CREATININE 0.75  CALCIUM 8.7*   GFR: Estimated Creatinine Clearance: 74.2 mL/min (by C-G formula based on SCr of 0.75 mg/dL). Liver Function Tests: No results for input(s): "AST", "ALT", "ALKPHOS", "BILITOT", "PROT", "ALBUMIN" in the last 168 hours. No results for input(s): "LIPASE", "AMYLASE" in the last 168 hours. No results for input(s): "AMMONIA" in the last 168 hours. Coagulation Profile: No results for input(s): "INR", "PROTIME" in the last 168 hours. Cardiac Enzymes: No results for input(s): "CKTOTAL", "CKMB", "CKMBINDEX", "TROPONINI", "TROPONINIHS" in the last 168 hours. BNP (last 3 results) No results for input(s): "PROBNP" in the last 8760 hours. HbA1C: No results for input(s): "HGBA1C" in the last 72 hours. CBG: No results for input(s): "GLUCAP" in the last 168 hours. Lipid Profile: No results for input(s): "CHOL", "HDL", "LDLCALC", "TRIG", "CHOLHDL", "LDLDIRECT" in the last 72 hours. Thyroid Function Tests: No results for input(s): "TSH", "T4TOTAL", "FREET4", "T3FREE", "THYROIDAB" in the last 72 hours. Anemia Panel: No results for input(s): "VITAMINB12", "FOLATE", "FERRITIN", "TIBC", "IRON", "RETICCTPCT" in the last 72 hours. Urine analysis: No results found for: "COLORURINE", "APPEARANCEUR", "LABSPEC", "PHURINE", "GLUCOSEU", "HGBUR", "BILIRUBINUR", "KETONESUR", "PROTEINUR", "UROBILINOGEN", "NITRITE", "LEUKOCYTESUR"  Radiological Exams on Admission: I have personally reviewed images CT VENOGRAM ABDOMEN PELVIS  Result Date: 05/29/2022 CLINICAL DATA:  Bilateral leg and foot swelling x2 days. EXAM: CT VENOGRAM ABDOMEN AND PELVIS TECHNIQUE: Multi detector CT imaging of the abdomen and pelvis was performed using the standard protocol during bolus administration of intravenous contrast. Multiplanar CT image reconstructions and MIPS were obtained to evaluate the vascular anatomy. RADIATION DOSE  REDUCTION: This exam was performed according to the departmental dose-optimization program which includes automated exposure control, adjustment of the mA and/or kV according to patient size and/or use of iterative reconstruction technique. CONTRAST:  188m OMNIPAQUE IOHEXOL 350 MG/ML SOLN COMPARISON:  April 26, 2022 FINDINGS: Lower chest: No acute abnormality. Hepatobiliary: No focal liver abnormality is seen. No gallstones, gallbladder wall thickening, or biliary dilatation. Pancreas: Unremarkable. No pancreatic ductal dilatation or surrounding inflammatory changes. Spleen: Normal in size without focal abnormality. Adrenals/Urinary Tract: A 2.8 cm x 1.5 cm x 2.3 cm heterogeneous low-attenuation right adrenal mass is seen (approximately 85.23 Hounsfield units). The left adrenal gland is unremarkable. CT kidneys are normal in size, without focal lesions. 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 No renal calculi are identified. A stable 7 mm focal calcification is seen within the posterior aspect of the urinary bladder wall on the left (axial CT image 70, CT series 2). Stomach/Bowel: Stomach is within normal limits. The appendix is limited in visualization and appears normal (axial CT images 53 through 59, CT series 2. A large amount of stool is seen throughout the colon. No evidence of bowel dilatation or inflammation. Vascular/Lymphatic: Aortic atherosclerosis. No intraluminal filling defects are seen within the venous structures. There is marked severity compression of the distal aspect of the bilateral  external iliac veins is noted (axial CT images 53 through 64, CT series 6). This is much more pronounced in severity when compared to the prior study. It should be noted that the vascular structures within the posterior aspect of the pelvis are limited in evaluation secondary to overlying streak artifact from adjacent  operative hardware within the lumbar spine. No enlarged abdominal or pelvic lymph nodes. Reproductive: Multiple dilated and tortuous vessels are seen along the lateral aspects of an enlarged, heterogeneous, lobulated uterus. Complex cysts and surrounding soft tissue are again seen within the right adnexa. The largest complex cyst measures approximately 4.2 cm x 2.8 cm x 2.1 cm and contains a 1.3 cm x 1.5 cm x 1.6 cm heterogeneous soft tissue component (approximately 96.50 Hounsfield units). A 5.3 cm x 3.9 cm x 5.6 cm area of heterogeneous soft tissue attenuation is again seen within the left adnexa and is indistinguishable from the adjacent uterus. Other: No abdominal wall hernia or abnormality. No abdominopelvic ascites. Multiple peritoneal soft tissue nodules are seen scattered throughout pelvis. These are present on the prior study. Musculoskeletal: A chronic compression fracture deformity is seen at the level of L2. Postoperative changes are seen within the mid and lower lumbar spine. Associated streak artifact is seen with subsequently limited evaluation of the adjacent osseous and soft tissue structures. IMPRESSION: 1. Complex bilateral adnexal masses, as described above, consistent with the patient's known malignancy. 2. Subsequent marked severity mass effect on the distal bilateral common iliac veins, increased in severity when compared to the prior exam. 3. Additional findings consistent with peritoneal metastasis within the pelvis. 4. Findings consistent with pelvic congestion syndrome. 5. Postoperative changes within the mid and lower lumbar spine. 6. Aortic atherosclerosis. 7. No evidence of venous thrombus within the abdomen, pelvis or proximal bilateral lower extremities. Aortic Atherosclerosis (ICD10-I70.0). Electronically Signed   By: Virgina Norfolk M.D.   On: 05/29/2022 22:57   VAS [place] LOWER EXTREMITY VENOUS (DVT) (7a-7p)  Result Date: 05/29/2022  Lower Venous DVT Study Patient Name:  EMIRA EUBANKS  Date of Exam:   05/29/2022 Medical Rec #: [id number]        Accession #:    [id number] Date of Birth: 22-Dec-1955       Patient Gender: F Patient Age:   11 years Exam Location:  [facility] Procedure:      VAS [place] LOWER EXTREMITY VENOUS (DVT) Referring Phys: ADAM CURATOLO --------------------------------------------------------------------------------  Indications: Pain.  Comparison Study: no prior Performing Technologist: Archie Patten RVS  Examination Guidelines: A complete evaluation includes B-mode imaging, spectral Doppler, color Doppler, and power Doppler as needed of all accessible portions of each vessel. Bilateral testing is considered an integral part of a complete examination. Limited examinations for reoccurring indications may be performed as noted. The reflux portion of the exam is performed with the patient in reverse Trendelenburg.  +-----+---------------+---------+-----------+----------+--------------+ RIGHTCompressibilityPhasicitySpontaneityPropertiesThrombus Aging +-----+---------------+---------+-----------+----------+--------------+ CFV  Full           Yes      Yes                                 +-----+---------------+---------+-----------+----------+--------------+   +---------+---------------+---------+-----------+----------+--------------+ LEFT     CompressibilityPhasicitySpontaneityPropertiesThrombus Aging +---------+---------------+---------+-----------+----------+--------------+ CFV      Full           Yes      Yes                                 +---------+---------------+---------+-----------+----------+--------------+  SFJ      Full                                                        +---------+---------------+---------+-----------+----------+--------------+ FV Prox  Full                                                        +---------+---------------+---------+-----------+----------+--------------+ FV Mid   Full                                                         +---------+---------------+---------+-----------+----------+--------------+ FV DistalFull                                                        +---------+---------------+---------+-----------+----------+--------------+ PFV      Full                                                        +---------+---------------+---------+-----------+----------+--------------+ POP      Full           Yes      Yes                                 +---------+---------------+---------+-----------+----------+--------------+ PTV      Full                                                        +---------+---------------+---------+-----------+----------+--------------+ PERO     Full           Yes      Yes                                 +---------+---------------+---------+-----------+----------+--------------+     Summary: RIGHT: - No evidence of common femoral vein obstruction.  LEFT: - There is no evidence of deep vein thrombosis in the lower extremity.  - No cystic structure found in the popliteal fossa.  *See table(s) above for measurements and observations. Electronically signed by Servando Snare MD on 05/29/2022 at 7:48:58 PM.    Final     EKG: My personal interpretation of EKG shows: no EKG to review    Assessment/Plan Principal Problem:   Bilateral leg edema Active Problems:   Ovarian cancer, bilateral ([place])   Occlusion of iliac vein ([place]) - due to external tumor compression of veins   Multiple sclerosis ([place])  Chronic use of opiate drug for therapeutic purpose    Assessment and Plan: * Bilateral leg edema Observation med/surg bed. Pt has bilateral leg/foot edema. Doubt she has cellulitis. More likely venous congestion due to iliac compression from tumor burden. Will need heme/onc to decide how best to proceed. Pt was suppose to start chemo this Friday. Already has her port-a-cath. LE U/S was negative for DVT.  Occlusion of iliac  vein (HCC) - due to external tumor compression of veins CT venogram shows external compression of her external iliac veins. Likely the cause of her lower leg edema and plethora.  Ovarian cancer, bilateral ([place]) Followed by heme/onc. Suppose to start chemo this Friday. I wonder if starting her chemo would help with bulky adenopathy/tumor burden that is causing iliac compression of veins and subsequent venous congestion of her legs.  Multiple sclerosis ([place]) Chronc. Stable.  Chronic use of opiate drug for therapeutic purpose On chronic Xtampza 9 mg bid and oxycodone 20 mg qid prn. Will add IV dilaudid for severe pain.   DVT prophylaxis: SQ Heparin Code Status: Full Code Family Communication: no family at bedside  Disposition Plan: return home  Consults called: none  Admission status: Observation, Med-Surg   Kristopher Oppenheim, DO [facility] 05/30/2022, 1:38 AM

## 2022-05-30 NOTE — Progress Notes (Signed)
  PT Cancellation Note  Patient Details Name: [name] MRN: [id number] DOB: 09-06-1955   Cancelled Treatment:    Reason Eval/Treat Not Completed: Patient not medically ready, will check back another time.  [place] Office [phone] Weekend [phone]    Claretha Cooper 05/30/2022, 10:00 AM

## 2022-05-30 NOTE — Assessment & Plan Note (Signed)
No significant baseline neurological deficits and no active treatment.

## 2022-05-30 NOTE — Plan of Care (Signed)
  Problem: Clinical Measurements: ?Goal: Ability to maintain clinical measurements within normal limits will improve ?Outcome: Progressing ?Goal: Will remain free from infection ?Outcome: Progressing ?Goal: Diagnostic test results will improve ?Outcome: Progressing ?Goal: Respiratory complications will improve ?Outcome: Progressing ?Goal: Cardiovascular complication will be avoided ?Outcome: Progressing ?  ?

## 2022-05-30 NOTE — Hospital Course (Signed)
[name] is a 67 y.o. 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    1/23: Admitted for Oncology consultation, Oncology recommended antibiotics for bilateral cellulitis

## 2022-05-30 NOTE — Assessment & Plan Note (Addendum)
On chronic Xtampza 9 mg bid and oxycodone 20 mg qid prn. Will add IV dilaudid for severe pain.

## 2022-05-30 NOTE — Assessment & Plan Note (Signed)
See above

## 2022-05-30 NOTE — Assessment & Plan Note (Addendum)
Observation med/surg bed. Pt has bilateral leg/foot edema. Doubt she has cellulitis. More likely venous congestion due to iliac compression from tumor burden. Will need heme/onc to decide how best to proceed. Pt was suppose to start chemo this Friday. Already has her port-a-cath. LE U/S was negative for DVT.

## 2022-05-31 DIAGNOSIS — R600 Localized edema: Secondary | ICD-10-CM | POA: Diagnosis not present

## 2022-05-31 DIAGNOSIS — Z79891 Long term (current) use of opiate analgesic: Secondary | ICD-10-CM | POA: Diagnosis not present

## 2022-05-31 DIAGNOSIS — M6281 Muscle weakness (generalized): Secondary | ICD-10-CM | POA: Diagnosis not present

## 2022-05-31 DIAGNOSIS — Z885 Allergy status to narcotic agent status: Secondary | ICD-10-CM | POA: Diagnosis not present

## 2022-05-31 DIAGNOSIS — Z87442 Personal history of urinary calculi: Secondary | ICD-10-CM | POA: Diagnosis not present

## 2022-05-31 DIAGNOSIS — M069 Rheumatoid arthritis, unspecified: Secondary | ICD-10-CM | POA: Diagnosis not present

## 2022-05-31 DIAGNOSIS — L03116 Cellulitis of left lower limb: Secondary | ICD-10-CM | POA: Diagnosis not present

## 2022-05-31 DIAGNOSIS — Z79899 Other long term (current) drug therapy: Secondary | ICD-10-CM | POA: Diagnosis not present

## 2022-05-31 DIAGNOSIS — C563 Malignant neoplasm of bilateral ovaries: Secondary | ICD-10-CM | POA: Diagnosis not present

## 2022-05-31 DIAGNOSIS — I82423 Acute embolism and thrombosis of iliac vein, bilateral: Secondary | ICD-10-CM | POA: Diagnosis not present

## 2022-05-31 DIAGNOSIS — K219 Gastro-esophageal reflux disease without esophagitis: Secondary | ICD-10-CM | POA: Diagnosis not present

## 2022-05-31 DIAGNOSIS — Z820 Family history of epilepsy and other diseases of the nervous system: Secondary | ICD-10-CM | POA: Diagnosis not present

## 2022-05-31 DIAGNOSIS — Z803 Family history of malignant neoplasm of breast: Secondary | ICD-10-CM | POA: Diagnosis not present

## 2022-05-31 DIAGNOSIS — Z8249 Family history of ischemic heart disease and other diseases of the circulatory system: Secondary | ICD-10-CM | POA: Diagnosis not present

## 2022-05-31 DIAGNOSIS — G35 Multiple sclerosis: Secondary | ICD-10-CM | POA: Diagnosis not present

## 2022-05-31 DIAGNOSIS — M1990 Unspecified osteoarthritis, unspecified site: Secondary | ICD-10-CM | POA: Diagnosis present

## 2022-05-31 DIAGNOSIS — R7303 Prediabetes: Secondary | ICD-10-CM | POA: Diagnosis present

## 2022-05-31 DIAGNOSIS — K5900 Constipation, unspecified: Secondary | ICD-10-CM | POA: Diagnosis present

## 2022-05-31 DIAGNOSIS — I871 Compression of vein: Secondary | ICD-10-CM | POA: Diagnosis not present

## 2022-05-31 DIAGNOSIS — Z8616 Personal history of COVID-19: Secondary | ICD-10-CM | POA: Diagnosis not present

## 2022-05-31 DIAGNOSIS — Z888 Allergy status to other drugs, medicaments and biological substances status: Secondary | ICD-10-CM | POA: Diagnosis not present

## 2022-05-31 DIAGNOSIS — Z539 Procedure and treatment not carried out, unspecified reason: Secondary | ICD-10-CM | POA: Diagnosis present

## 2022-05-31 DIAGNOSIS — I10 Essential (primary) hypertension: Secondary | ICD-10-CM | POA: Diagnosis not present

## 2022-05-31 DIAGNOSIS — L03115 Cellulitis of right lower limb: Secondary | ICD-10-CM | POA: Diagnosis not present

## 2022-05-31 DIAGNOSIS — N9489 Other specified conditions associated with female genital organs and menstrual cycle: Secondary | ICD-10-CM | POA: Diagnosis present

## 2022-05-31 DIAGNOSIS — J4489 Other specified chronic obstructive pulmonary disease: Secondary | ICD-10-CM | POA: Diagnosis not present

## 2022-05-31 LAB — CBC
HCT: 36.7 % (ref 36.0–46.0)
Hemoglobin: 12.3 g/dL (ref 12.0–15.0)
MCH: 31.9 pg (ref 26.0–34.0)
MCHC: 33.5 g/dL (ref 30.0–36.0)
MCV: 95.1 fL (ref 80.0–100.0)
Platelets: 282 10*3/uL (ref 150–400)
RBC: 3.86 MIL/uL — ABNORMAL LOW (ref 3.87–5.11)
RDW: 11.2 % — ABNORMAL LOW (ref 11.5–15.5)
WBC: 3.3 10*3/uL — ABNORMAL LOW (ref 4.0–10.5)
nRBC: 0 % (ref 0.0–0.2)

## 2022-05-31 LAB — COMPREHENSIVE METABOLIC PANEL
ALT: 22 U/L (ref 0–44)
AST: 25 U/L (ref 15–41)
Albumin: 3.5 g/dL (ref 3.5–5.0)
Alkaline Phosphatase: 62 U/L (ref 38–126)
Anion gap: 8 (ref 5–15)
BUN: 8 mg/dL (ref 8–23)
CO2: 29 mmol/L (ref 22–32)
Calcium: 8.9 mg/dL (ref 8.9–10.3)
Chloride: 100 mmol/L (ref 98–111)
Creatinine, Ser: 0.84 mg/dL (ref 0.44–1.00)
GFR, Estimated: >60 mL/min (ref >60)
Glucose, Bld: 148 mg/dL — ABNORMAL HIGH (ref 70–99)
Potassium: 3.7 mmol/L (ref 3.5–5.1)
Sodium: 137 mmol/L (ref 135–145)
Total Bilirubin: 0.5 mg/dL (ref 0.3–1.2)
Total Protein: 6.8 g/dL (ref 6.5–8.1)

## 2022-05-31 MED ORDER — IOHEXOL 9 MG/ML PO SOLN
ORAL | Status: AC
  Filled 2022-05-31: qty 1000

## 2022-05-31 MED ORDER — HYDROMORPHONE HCL 2 MG PO TABS
2.0000 mg | ORAL_TABLET | Freq: Once | ORAL | Status: AC
  Administered 2022-05-31: 2 mg via ORAL
  Filled 2022-05-31: qty 1

## 2022-05-31 MED ORDER — CHLORHEXIDINE GLUCONATE CLOTH 2 % EX PADS
6.0000 | MEDICATED_PAD | Freq: Every day | CUTANEOUS | Status: DC
  Administered 2022-05-31 – 2022-06-01 (×2): 6 via TOPICAL

## 2022-05-31 MED ORDER — LORAZEPAM 0.5 MG PO TABS
0.2500 mg | ORAL_TABLET | Freq: Once | ORAL | Status: AC
  Administered 2022-05-31: 0.25 mg via ORAL
  Filled 2022-05-31: qty 1

## 2022-05-31 MED ORDER — IOHEXOL 9 MG/ML PO SOLN: 500.0000 mL | ORAL | Status: DC

## 2022-05-31 NOTE — Care Management Obs Status (Signed)
[facility] NOTIFICATION   Patient Details  Name: [name] MRN: [id number] Date of Birth: [birth year]   Medicare Observation Status Notification Given:  Yes    Angelita Ingles, RN 05/31/2022, 10:16 AM

## 2022-05-31 NOTE — Evaluation (Signed)
Physical Therapy Evaluation Patient Details Name: [name] MRN: [id number] DOB: 12-28-1955 Today's Date: 05/31/2022  History of Present Illness  Pt is a 67 y/o F admitted on 05/29/22 after presenting to the ED with c/o worsening BLE edema & pain. CT findings were consistent with pelvic congestion syndrome (CT showed large bilateral adnexal masses consistent with her known ovarian cancer; these masses also affect the distal bilateral common iliac veins causing external compression). PMH: ovarian CA, peritoneal carcinomatosis, MS, anxiety, Hep C, RA, R shoulder rotator cuff tear  Clinical Impression  Pt seen for PT evaluation with pt agreeable. Pt reports prior to admission she was mod I with SPC, living alone in a 1 level apartment with level entry, cooking, cleaning, driving, & denying falls. On this date, pt endorses BLE pain (L>R) with LLE edema & erythema observed. Pt is able to complete bed mobility & transfers with mod I but has limited standing/weight bearing tolerance 2/2 BLE pain. Provided pt with RW & pt able to ambulate x 150 ft with mod I with slow, steady gait speed. PT educated pt on posture & use of AD, as well as walker bag to transport items. Educated pt on recommendation on use of RW for pain management & to transition back to [facility] when pt feels better & pt voiced understanding. Pt does not demonstrate any further acute PT needs at this time. PT to sign off, please re-consult if new needs arise.     Recommendations for follow up therapy are one component of a multi-disciplinary discharge planning process, led by the attending physician.  Recommendations may be updated based on patient status, additional functional criteria and insurance authorization.  Follow Up Recommendations No PT follow up      Assistance Recommended at Discharge PRN  Patient can return home with the following       Equipment Recommendations Rolling walker (2 wheels)  Recommendations for Other  Services       Functional Status Assessment Patient has not had a recent decline in their functional status     Precautions / Restrictions Precautions Precautions: Fall Restrictions Weight Bearing Restrictions: No      Mobility  Bed Mobility Overal bed mobility: Modified Independent             General bed mobility comments: supine>sit with HOB slightly elevated    Transfers Overall transfer level: Modified independent Equipment used: Rolling walker (2 wheels), Straight cane               General transfer comment: STS with SPC or RW (educational cuing re: hand placement & technique when transferring STS with RW)    Ambulation/Gait Ambulation/Gait assistance: Modified independent (Device/Increase time) Gait Distance (Feet): 150 Feet Assistive device: Rolling walker (2 wheels) Gait Pattern/deviations: Decreased step length - left, Decreased step length - right, Decreased stride length, Decreased dorsiflexion - right, Decreased dorsiflexion - left Gait velocity: decreased     General Gait Details: Educational cuing for upright posture.  Stairs            Wheelchair Mobility    Modified Rankin (Stroke Patients Only)       Balance Overall balance assessment: Modified Independent                                           Pertinent Vitals/Pain Pain Assessment Pain Assessment: Faces Faces Pain Scale: Hurts even  more Pain Location: BLE & chronic back pain Pain Descriptors / Indicators: Discomfort, Grimacing Pain Intervention(s): Monitored during session    Home Living Family/patient expects to be discharged to:: Private residence Living Arrangements: Alone   Type of Home: Apartment Home Access: Level entry       Home Layout: One level Home Equipment: Cane - single point      Prior Function Prior Level of Function : Independent/Modified Independent;Working/employed;Driving             Mobility Comments: Pt is mod I  with SPC (or will hold to furniture in small spaces in house), denies falls in the past 6 months.       Hand Dominance        Extremity/Trunk Assessment   Upper Extremity Assessment Upper Extremity Assessment: Overall WFL for tasks assessed    Lower Extremity Assessment Lower Extremity Assessment: Overall WFL for tasks assessed    Cervical / Trunk Assessment Cervical / Trunk Assessment:  (LLE edema & erythema noted)  Communication   Communication: No difficulties  Cognition Arousal/Alertness: Awake/alert Behavior During Therapy: WFL for tasks assessed/performed Overall Cognitive Status: Within Functional Limits for tasks assessed                                 General Comments: Pleasant woman, good understanding of information/education throughout session.        General Comments      Exercises     Assessment/Plan    PT Assessment Patient does not need any further PT services  PT Problem List Pain;Decreased mobility       PT Treatment Interventions DME instruction;Functional mobility training    PT Goals (Current goals can be found in the Care Plan section)  Acute Rehab PT Goals Patient Stated Goal: decreased pain, cancer to go away PT Goal Formulation: With patient Time For Goal Achievement: 06/14/22 Potential to Achieve Goals: Fair    Frequency       Co-evaluation               AM-PAC PT "6 Clicks" Mobility  Outcome Measure Help needed turning from your back to your side while in a flat bed without using bedrails?: None Help needed moving from lying on your back to sitting on the side of a flat bed without using bedrails?: None Help needed moving to and from a bed to a chair (including a wheelchair)?: None Help needed standing up from a chair using your arms (e.g., wheelchair or bedside chair)?: None Help needed to walk in hospital room?: None Help needed climbing 3-5 steps with a railing? : None 6 Click Score: 24    End of  Session   Activity Tolerance: Patient tolerated treatment well;Patient limited by pain Patient left: in chair;with call bell/phone within reach   PT Visit Diagnosis: Pain Pain - Right/Left:  (bilateral (L>R)) Pain - part of body: Ankle and joints of foot;Leg    Time: 1017-5102 PT Time Calculation (min) (ACUTE ONLY): 14 min   Charges:   PT Evaluation $PT Eval Low Complexity: [place], PT, DPT 05/31/22, 12:52 PM   Waunita Schooner 05/31/2022, 12:49 PM

## 2022-05-31 NOTE — Assessment & Plan Note (Signed)
Not on DMARD

## 2022-05-31 NOTE — Progress Notes (Signed)
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  She has no bleeding complications  Lower extremity cellulitis I have discussed this with primary service She has persistent cellulitis on exam, warm and tender to touch She is not ready to go home today and will need to continue IV antibiotics I will reassess tomorrow   moderate constipation, resolving She is in agreement for aggressive laxative regimen   Goals of care I am hopeful she can be discharged after resolution of cellulitis and constipation   Discharge planning Hopefully in the next 24 to 48 hours  All questions were answered. The patient knows to call the clinic with any problems, questions or concerns.   The total time spent in the appointment was 40 minutes encounter with patients including review of chart and various tests results, discussions about plan of care and coordination of care plan  Heath Lark, MD 05/31/2022 9:55 AM  Subjective:  The patient was admitted to the hospital due to lower extremity edema, cellulitis and abdominal pain secondary to disease and constipation Overnight, she had bowel movement but she still feels somewhat constipated She is still unable to walk or stand due to pain from her legs from cellulitis It is warm and tender to touch She has no bleeding complications since starting on Eliquis  Objective:  Vitals:   05/30/22  2051 05/31/22 0519  BP: 138/88 (!) 145/93  Pulse: 90 90  Resp: 18 18  Temp: 98.8 F (37.1 C) 98 F (36.7 C)  SpO2: 99% 99%     Intake/Output Summary (Last 24 hours) at 05/31/2022 0955 Last data filed at 05/31/2022 1165 Gross per 24 hour  Intake 655 ml  Output --  Net 655 ml    GENERAL:alert, no distress and comfortable SKIN: Bilateral lower extremity edema has resolved.  However, she has persistent erythematous changes that is tender and warm to touch, consistent with cellulitis.  It is not worse but has not improved much since admission NEURO: alert & oriented x 3 with fluent speech, no focal motor/sensory deficits   Labs:  Recent Labs    05/16/22 0951 05/29/22 1712 05/31/22 0615  NA 139 136 137  K 4.2 4.0 3.7  CL 107 99 100  CO2 '24 29 29  '$ GLUCOSE 117* 121* 148*  BUN '17 13 8  '$ CREATININE 0.91 0.75 0.84  CALCIUM 9.0 8.7* 8.9  GFRNONAA >60 >60 >60  PROT 7.3  --  6.8  ALBUMIN 3.8  --  3.5  AST 17  --  25  ALT 20  --  22  ALKPHOS 81  --  62  BILITOT 0.3  --  0.5

## 2022-05-31 NOTE — Progress Notes (Signed)
   05/31/22 1050  Pain Assessment  Pain Scale 0-10  Pain Score 6  Pain Type Acute pain  Pain Location Abdomen  Pain Orientation Mid  Pain Radiating Towards Pelvis  Pain Descriptors / Indicators Aching  Pain Frequency Constant  Pain Onset On-going  Pain Intervention(s) MD notified (Comment)  Provider Notification  Provider Name/Title Dr. Loleta Books  Date Provider Notified 05/31/22  Time Provider Notified 1051  Method of Notification  (Secure Chat)  Notification Reason Requested by patient/family (Patient pain level not well controlled with oral medications.)  Provider response Evaluate remotely;See new orders  Date of Provider Response 05/31/22  Time of Provider Response 56   Notified Dr. Loleta Books of continued pain despite oxycodone IR and oxycontin medications.

## 2022-05-31 NOTE — Discharge Instructions (Signed)

## 2022-05-31 NOTE — Progress Notes (Signed)
After notifying Dr. Loleta Books of pain despite oral oxycodone and oxycontin, once order of dilaudid tablet given at this time per orders.  Patient declines to have CT scan without discussing abdominal pain further with Dr. Loleta Books. Held dose of oral contrast at this time as patient is not agreeable to CT scan. Due time changed to later in the day.

## 2022-05-31 NOTE — Progress Notes (Signed)
  Progress Note   Patient: [name] WTU:[id number] DOB: [birth year] DOA: 05/29/2022     0 DOS: the patient was seen and examined on 05/31/2022        Brief hospital course: [name] is a 67 y.o. 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    1/23: Admitted for Oncology consultation, Oncology recommended antibiotics for bilateral cellulitis     Assessment and Plan: * Bilateral leg edema Discussed with Dr. Alvy Bimler, Oncology have started antibiotics and low dose apixaban.  Defer to Oncology's more extensive experience with tumor compression of the vena cava. 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 Pain poorly controlled here.  Multiple sclerosis ([place]) No significant baseline neurological deficits and no active treatment.  RA (rheumatoid arthritis) (HCC) Not on DMARD          Subjective: Swelling in ankles resolved, redness improved.  No fever.  Having significant abdominal pain today, much more than baseline.  No vomiting.     Physical Exam: BP (!) 145/93 (BP Location: Right Arm)   Pulse 90   Temp 98 F (36.7 C) (Oral)   Resp 18   Ht '5\' 9"'$  (1.753 m)   Wt 78 kg   SpO2 99%   BMI 25.39 kg/m   Adult female, lying in bed, appears uncomfortable RRR, no murmurs, no peripheral edema Respiratory rate normal, lungs clear without rales or wheezes Abdomen: Tenderness to light palpation throughout, no masses appreciated, guarding is voluntary, no rigidity, no distention Attention normal, affect appropriate, judgment insight appear  normal Bilateral lower extremities appear improved from yesterday   Data Reviewed: Discussed with oncology CBC unremarkable Comprehensive metabolic panel unremarkable   Family Communication: None present    Disposition: Status is: Inpatient         Author: Edwin Dada, MD 05/31/2022 12:22 PM  For on call review [email or web address].

## 2022-06-01 ENCOUNTER — Other Ambulatory Visit (HOSPITAL_COMMUNITY): Payer: Self-pay

## 2022-06-01 ENCOUNTER — Encounter: Payer: Self-pay | Admitting: Hematology and Oncology

## 2022-06-01 ENCOUNTER — Other Ambulatory Visit: Payer: Self-pay | Admitting: Hematology and Oncology

## 2022-06-01 ENCOUNTER — Inpatient Hospital Stay: Payer: 59 | Attending: Gynecologic Oncology

## 2022-06-01 DIAGNOSIS — R600 Localized edema: Secondary | ICD-10-CM | POA: Diagnosis not present

## 2022-06-01 LAB — BASIC METABOLIC PANEL
Anion gap: 8 (ref 5–15)
BUN: 10 mg/dL (ref 8–23)
CO2: 27 mmol/L (ref 22–32)
Calcium: 8.8 mg/dL — ABNORMAL LOW (ref 8.9–10.3)
Chloride: 104 mmol/L (ref 98–111)
Creatinine, Ser: 0.78 mg/dL (ref 0.44–1.00)
GFR, Estimated: >60 mL/min (ref >60)
Glucose, Bld: 123 mg/dL — ABNORMAL HIGH (ref 70–99)
Potassium: 3.6 mmol/L (ref 3.5–5.1)
Sodium: 139 mmol/L (ref 135–145)

## 2022-06-01 LAB — CBC
HCT: 36.4 % (ref 36.0–46.0)
Hemoglobin: 12.2 g/dL (ref 12.0–15.0)
MCH: 31.6 pg (ref 26.0–34.0)
MCHC: 33.5 g/dL (ref 30.0–36.0)
MCV: 94.3 fL (ref 80.0–100.0)
Platelets: 288 10*3/uL (ref 150–400)
RBC: 3.86 MIL/uL — ABNORMAL LOW (ref 3.87–5.11)
RDW: 11.1 % — ABNORMAL LOW (ref 11.5–15.5)
WBC: 3.8 10*3/uL — ABNORMAL LOW (ref 4.0–10.5)
nRBC: 0 % (ref 0.0–0.2)

## 2022-06-01 MED ORDER — APIXABAN 2.5 MG PO TABS
2.5000 mg | ORAL_TABLET | Freq: Two times a day (BID) | ORAL | 11 refills | Status: DC
  Filled 2022-06-01: qty 60, 30d supply, fill #0
  Filled 2022-07-01: qty 60, 30d supply, fill #1
  Filled 2022-08-03: qty 60, 30d supply, fill #2

## 2022-06-01 MED ORDER — HEPARIN SOD (PORK) LOCK FLUSH 100 UNIT/ML IV SOLN: 500.0000 [IU] | INTRAVENOUS | Status: DC | PRN

## 2022-06-01 MED ORDER — CEFADROXIL 500 MG PO CAPS
500.0000 mg | ORAL_CAPSULE | Freq: Two times a day (BID) | ORAL | 0 refills | Status: DC
  Filled 2022-06-01: qty 10, 5d supply, fill #0

## 2022-06-01 MED ORDER — HEPARIN SOD (PORK) LOCK FLUSH 100 UNIT/ML IV SOLN
INTRAVENOUS | Status: AC
  Filled 2022-06-01: qty 5

## 2022-06-01 MED FILL — Fosaprepitant Dimeglumine For IV Infusion 150 MG (Base Eq): INTRAVENOUS | Qty: 5 | Status: AC

## 2022-06-01 MED FILL — Dexamethasone Sodium Phosphate Inj 100 MG/10ML: INTRAMUSCULAR | Qty: 1 | Status: AC

## 2022-06-01 NOTE — TOC Progression Note (Addendum)
Transition of Care [facility]) - Progression Note    Patient Details  Name: [name] MRN: [id number] Date of Birth: June 25, 1955  Transition of [facility]) CM/SW [place], RN Phone Number:[phone]  06/01/2022, 11:54 AM  Clinical Narrative:    CM spoke with patient about recommendation for rolling walker. Patient does not have walker and is ok with CM ordering. Rolling walker ordered from [employer] per [place] and to be delivered to the bedside.        Expected Discharge Plan and Services                                               Social Determinants of Health (SDOH) Interventions SDOH Screenings   Food Insecurity: No Food Insecurity (05/30/2022)  Housing: Low Risk  (05/30/2022)  Transportation Needs: No Transportation Needs (05/30/2022)  Utilities: Not At Risk (05/30/2022)  Tobacco Use: Medium Risk (05/30/2022)    Readmission Risk Interventions     No data to display

## 2022-06-01 NOTE — Progress Notes (Signed)
Discharge instructions provided to patient. All medications, follow up appointments, and discharge instructions provided.

## 2022-06-01 NOTE — Progress Notes (Signed)
Chaplain engaged in an initial visit with [name].  [name] shared about her recent diagnosis and her grief in finding out that she has cancer along with other health challenges.  Chaplain normalized her feelings.  [name] was candid about feeling overwhelmed yesterday.  Today, [name] is much more aware of her journey ahead and finding the willingness to take it on.  [name] has her faith, community, and family as great support throughout this diagnosis.    Chaplain offered reflective listening, affirmation, and support as she shared about her healthcare journey.  She recognizes now the need to ask more questions and speak up for herself concerning her body.  Chaplain commended [name] for the voice she has found to ensure she receive equitable treatment.   Chaplain and [name] prayed together about the "unknown" journey she has ahead.  Chaplain offered encouragement and hope as she endures something that is new for her, but not impossible for the Divine.    Chaplain [name], MDiv  06/01/22 1500  Spiritual Encounters  Type of Visit Initial  Care provided to: Patient  Referral source Physician;Patient request  Reason for visit Grief/loss  Spiritual Framework  Presenting Themes Meaning/purpose/sources of inspiration;Goals in life/care;Significant life change;Courage hope and growth;Rituals and practive  Strengths Faith, community  Patient Stress Factors Major life changes;Health changes  Interventions  Spiritual Care Interventions Made Established relationship of care and support;Compassionate presence;Reflective listening;Prayer;Encouragement;Supported grief process;Normalization of emotions  Intervention Outcomes  Outcomes Awareness of support;Connection to spiritual care;Awareness around self/spiritual resourses;Autonomy/agency;Reduced anxiety

## 2022-06-01 NOTE — Discharge Summary (Signed)
Physician Discharge Summary   Patient: [name] MRN: [id number] DOB: Sep 02, 1955  Admit date:     05/29/2022  Discharge date: 06/01/22  Discharge Physician: Edwin Dada   PCP: Shirline Frees, MD     Recommendations at discharge:  Follow up with Oncology Dr. 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 Ovarian cancer, bilateral [facility]) Oncology consulted, recommended elevation, apixaban 2.5 BID and Rocephin.    Her redness in the legs and pain improved over 2 days and she was discharged on short course cefadroxil, new apixaban.  Plan to start chemo tomorrow.     Chronic use of opiate drug for therapeutic purpose On chronic Xtampza 9 mg bid and oxycodone 20 mg qid prn. Pain poorly controlled here.  Multiple sclerosis ([place]) No significant baseline neurological deficits and no active treatment.  RA (rheumatoid arthritis) ([place]) Not on DMARD            The [place] was reviewed for this patient prior to discharge.   Consultants: Oncology Procedures performed: CT venogram  Disposition: Home Diet recommendation:  Regular  DISCHARGE MEDICATION: Allergies as of 06/01/2022       Reactions   Cymbalta [duloxetine Hcl] Shortness Of Breath, Swelling, Other (See Comments)   Tongue and  leg swelling   Cyclobenzaprine Hcl Other (See Comments)   Immobility, tolerates in small doses    Celexa  [citalopram Hydrobromide] Swelling, Other (See Comments)   Tongue swelling   Fluticasone Furoate-vilanterol Swelling, Other (See Comments)   BREO ELLIPTA- Tongue swelling, but breathing not affected   Gabapentin Other (See Comments)   Feels awful when taking, drowsy    Opana  [oxymorphone Hcl] Itching, Other (See Comments)   Loss of hair, also   Tramadol Other (See Comments)   Dizziness        Medication List     STOP taking these medications    naproxen 500 MG tablet Commonly known as: NAPROSYN       TAKE these medications    albuterol 108 (90 Base) MCG/ACT inhaler Commonly known as: VENTOLIN HFA Inhale 2 puffs into the lungs every 6 (six) hours as needed for shortness of breath.   amphetamine-dextroamphetamine 20 MG tablet Commonly known as: ADDERALL Take 10-20 mg by mouth See admin instructions. Take 20 mg by mouth in the morning and 10 mg between 1-2 PM daily   ASHWAGANDHA PO Take 2 capsules by mouth every 7 (seven) days.   BC HEADACHE POWDER PO Take 1 packet by mouth as needed (for headaches).   BLACK CURRANT SEED OIL PO Take 5 mLs by mouth every 7 (seven) days.   cefadroxil 500 MG capsule Commonly known as: DURICEF Take 1 capsule (500 mg total) by mouth 2 (two) times daily.   cyclobenzaprine 5 MG tablet Commonly  known as: FLEXERIL Take 5-10 mg by mouth 3 (three) times daily as needed for muscle spasms.   dexamethasone 4 MG tablet Commonly known as: DECADRON Take 2 tabs at the night before and 2 tab the morning of chemotherapy, every 3 weeks, by mouth x 6 cycles   diclofenac Sodium 1 % Gel Commonly known as: VOLTAREN Apply 2 g topically daily as needed (to affected areas- for pain).   Eliquis 2.5 MG Tabs tablet Generic drug: apixaban Take 1 tablet (2.5 mg total) by mouth 2 (two) times daily.   hydroxychloroquine 200 MG tablet Commonly known  as: PLAQUENIL Take 200 mg by mouth daily.   irbesartan 300 MG tablet Commonly known as: AVAPRO Take 300 mg by mouth daily.   lidocaine-prilocaine cream Commonly known as: EMLA Apply to affected area once   LORazepam 0.5 MG tablet Commonly known as: ATIVAN Take 0.5 mg by mouth 3 (three) times daily.   meclizine 12.5 MG tablet Commonly known as: ANTIVERT Take 12.5 mg by mouth 3 (three) times daily as needed for dizziness.   Melatonin 10 MG Tabs Take 10 mg by mouth at bedtime as needed (sleep).   multivitamin with minerals Tabs tablet Take 1 tablet by mouth daily.   mupirocin ointment 2 % Commonly known as: BACTROBAN Apply 1 Application topically 3 (three) times daily as needed (boils).   Narcan 4 MG/0.1ML Liqd nasal spray kit Generic drug: naloxone Place 1 spray into the nose as needed (opioid overdose).   omeprazole 20 MG tablet Commonly known as: PRILOSEC OTC Take 20 mg by mouth daily as needed (for acid reflux).   ondansetron 8 MG tablet Commonly known as: Zofran Take 1 tablet (8 mg total) by mouth every 8 (eight) hours as needed for nausea or vomiting. Start on the third day after chemotherapy.   OVER THE COUNTER MEDICATION Take 2 capsules by mouth daily. Sea moss supplement   OVER THE COUNTER MEDICATION Take 2 capsules by mouth See admin instructions. "Liver Focus" capsules- Take 2 capsules by mouth at bedtime   Oxycodone HCl 20 MG Tabs Take 20 mg by mouth See admin instructions. Take 20 mg by mouth four to five times a day as needed for pain   polyethylene glycol 17 g packet Commonly known as: MIRALAX / GLYCOLAX Take 17 g by mouth daily.   POTASSIUM PO Take 1 tablet by mouth every 7 (seven) days.   prochlorperazine 10 MG tablet Commonly known as: COMPAZINE Take 1 tablet (10 mg total) by mouth every 6 (six) hours as needed for nausea or vomiting.   TURMERIC CURCUMIN PO Take 1,000 mg by mouth daily.   VITAMIN A PALMITATE PO Take by mouth.   VITAMIN  A-BETA CAROTENE PO Take 2,400 Units by mouth once a week.   vitamin C 1000 MG tablet Take 1,000 mg by mouth once a week.   Vitamin D (Ergocalciferol) 1.25 MG (50000 UNIT) Caps capsule Commonly known as: DRISDOL Take 50,000 Units by mouth every Sunday.   Wixela Inhub 250-50 MCG/ACT Aepb Generic drug: fluticasone-salmeterol Inhale 1 puff into the lungs daily.   Xtampza ER 9 MG C12a Generic drug: oxyCODONE ER Take 9 mg by mouth 2 (two) times daily.   zinc gluconate 50 MG tablet Take 50 mg by mouth once a week.               Durable Medical Equipment  (From admission, onward)           Start  Ordered   05/31/22 1253  For home use only DME Walker rolling  Once       Question Answer Comment  Walker: With [place]   Patient needs a walker to treat with the following condition General weakness      05/31/22 1252            Follow-up Information     Heath Lark, MD Follow up.   Specialty: Hematology and Oncology Contact information: [place] [id number] [phone]                 Discharge Instructions     Discharge instructions   Complete by: As directed    **IMPORTANT DISCHARGE INSTRUCTIONS**   From Dr. Loleta Books: You were admitted for compression of the vena cava due to tumor You were treated here with a blood thinner and this seemed to help. You also got antibiotics, incase the legs were infected  Take apixaban/Eliquis 2.5 mg twice daily until Dr. Alvy Bimler tells you to stop This is the blood thinner   Take cefadroxil 500 mg twice daily for 5 days This is the antibiotic   Increase activity slowly   Complete by: As directed    No wound care   Complete by: As directed        Discharge Exam: Filed Weights   05/29/22 1648 05/30/22 1635  Weight: 78 kg 78 kg    General: Pt is alert, awake, not in acute distress Cardiovascular: RRR, nl S1-S2, no murmurs appreciated.   No LE edema.    Respiratory: Normal respiratory rate and rhythm.  CTAB without rales or wheezes. Abdominal: Abdomen soft and non-tender.  No distension or HSM.   Neuro/Psych: Strength symmetric in upper and lower extremities.  Judgment and insight appear normal.   Condition at discharge: Stable  The results of significant diagnostics from this hospitalization (including imaging, microbiology, ancillary and laboratory) are listed below for reference.   Imaging Studies: CT VENOGRAM ABDOMEN PELVIS  Result Date: 05/29/2022 CLINICAL DATA:  Bilateral leg and foot swelling x2 days. EXAM: CT VENOGRAM ABDOMEN AND PELVIS TECHNIQUE: Multi detector CT imaging of the abdomen and pelvis was performed using the standard protocol during bolus administration of intravenous contrast. Multiplanar CT image reconstructions and MIPS were obtained to evaluate the vascular anatomy. RADIATION DOSE REDUCTION: This exam was performed according to the departmental dose-optimization program which includes automated exposure control, adjustment of the mA and/or kV according to patient size and/or use of iterative reconstruction technique. CONTRAST:  159m OMNIPAQUE IOHEXOL 350 MG/ML SOLN COMPARISON:  April 26, 2022 FINDINGS: Lower chest: No acute abnormality. Hepatobiliary: No focal liver abnormality is seen. No gallstones, gallbladder wall thickening, or biliary dilatation. Pancreas: Unremarkable. No pancreatic ductal dilatation or surrounding inflammatory changes. Spleen: Normal in size without focal abnormality. Adrenals/Urinary Tract: A 2.8 cm x 1.5 cm x 2.3 cm heterogeneous low-attenuation right adrenal mass is seen (approximately 85.23 Hounsfield units). The left adrenal gland is unremarkable. CT kidneys are normal in size, without focal lesions. 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 No renal calculi are identified. A stable 7 mm focal calcification  is seen within the posterior aspect of the urinary bladder wall on the left (axial CT image 70, CT series 2). Stomach/Bowel: Stomach is within normal limits. The appendix is limited in visualization and appears normal (axial CT images 53 through 59, CT series 2. A large amount  of stool is seen throughout the colon. No evidence of bowel dilatation or inflammation. Vascular/Lymphatic: Aortic atherosclerosis. No intraluminal filling defects are seen within the venous structures. There is marked severity compression of the distal aspect of the bilateral external iliac veins is noted (axial CT images 53 through 64, CT series 6). This is much more pronounced in severity when compared to the prior study. It should be noted that the vascular structures within the posterior aspect of the pelvis are limited in evaluation secondary to overlying streak artifact from adjacent operative hardware within the lumbar spine. No enlarged abdominal or pelvic lymph nodes. Reproductive: Multiple dilated and tortuous vessels are seen along the lateral aspects of an enlarged, heterogeneous, lobulated uterus. Complex cysts and surrounding soft tissue are again seen within the right adnexa. The largest complex cyst measures approximately 4.2 cm x 2.8 cm x 2.1 cm and contains a 1.3 cm x 1.5 cm x 1.6 cm heterogeneous soft tissue component (approximately 96.50 Hounsfield units). A 5.3 cm x 3.9 cm x 5.6 cm area of heterogeneous soft tissue attenuation is again seen within the left adnexa and is indistinguishable from the adjacent uterus. Other: No abdominal wall hernia or abnormality. No abdominopelvic ascites. Multiple peritoneal soft tissue nodules are seen scattered throughout pelvis. These are present on the prior study. Musculoskeletal: A chronic compression fracture deformity is seen at the level of L2. Postoperative changes are seen within the mid and lower lumbar spine. Associated streak artifact is seen with subsequently limited  evaluation of the adjacent osseous and soft tissue structures. IMPRESSION: 1. Complex bilateral adnexal masses, as described above, consistent with the patient's known malignancy. 2. Subsequent marked severity mass effect on the distal bilateral common iliac veins, increased in severity when compared to the prior exam. 3. Additional findings consistent with peritoneal metastasis within the pelvis. 4. Findings consistent with pelvic congestion syndrome. 5. Postoperative changes within the mid and lower lumbar spine. 6. Aortic atherosclerosis. 7. No evidence of venous thrombus within the abdomen, pelvis or proximal bilateral lower extremities. Aortic Atherosclerosis (ICD10-I70.0). Electronically Signed   By: Virgina Norfolk M.D.   On: 05/29/2022 22:57   VAS [place] LOWER EXTREMITY VENOUS (DVT) (7a-7p)  Result Date: 05/29/2022  Lower Venous DVT Study Patient Name:  ADI SEALES  Date of Exam:   05/29/2022 Medical Rec #: [id number]        Accession #:    [id number] Date of Birth: 04-28-1956       Patient Gender: F Patient Age:   35 years Exam Location:  [facility] Procedure:      VAS [place] LOWER EXTREMITY VENOUS (DVT) Referring Phys: ADAM CURATOLO --------------------------------------------------------------------------------  Indications: Pain.  Comparison Study: no prior Performing Technologist: Archie Patten RVS  Examination Guidelines: A complete evaluation includes B-mode imaging, spectral Doppler, color Doppler, and power Doppler as needed of all accessible portions of each vessel. Bilateral testing is considered an integral part of a complete examination. Limited examinations for reoccurring indications may be performed as noted. The reflux portion of the exam is performed with the patient in reverse Trendelenburg.  +-----+---------------+---------+-----------+----------+--------------+ RIGHTCompressibilityPhasicitySpontaneityPropertiesThrombus Aging  +-----+---------------+---------+-----------+----------+--------------+ CFV  Full           Yes      Yes                                 +-----+---------------+---------+-----------+----------+--------------+   +---------+---------------+---------+-----------+----------+--------------+ LEFT     CompressibilityPhasicitySpontaneityPropertiesThrombus Aging +---------+---------------+---------+-----------+----------+--------------+ CFV  Full           Yes      Yes                                 +---------+---------------+---------+-----------+----------+--------------+ SFJ      Full                                                        +---------+---------------+---------+-----------+----------+--------------+ FV Prox  Full                                                        +---------+---------------+---------+-----------+----------+--------------+ FV Mid   Full                                                        +---------+---------------+---------+-----------+----------+--------------+ FV DistalFull                                                        +---------+---------------+---------+-----------+----------+--------------+ PFV      Full                                                        +---------+---------------+---------+-----------+----------+--------------+ POP      Full           Yes      Yes                                 +---------+---------------+---------+-----------+----------+--------------+ PTV      Full                                                        +---------+---------------+---------+-----------+----------+--------------+ PERO     Full           Yes      Yes                                 +---------+---------------+---------+-----------+----------+--------------+     Summary: RIGHT: - No evidence of common femoral vein obstruction.  LEFT: - There is no evidence of deep vein thrombosis in the  lower extremity.  - No cystic structure found in the popliteal fossa.  *See table(s) above for measurements and observations. Electronically signed by Servando Snare MD on 05/29/2022 at 7:48:58 PM.    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 Failed attempt at CT-guided biopsy under conscious sedation on 05/09/2022 for due to patient anxiety, pain and rapid respiratory rate. EXAM: ULTRASOUND GUIDED CORE BIOPSY OF PERITONEAL MASS MEDICATIONS: The patient was under general anesthesia and the biopsy procedure immediately followed a port placement. PROCEDURE: The procedure, risks, benefits, and alternatives were explained to the patient. Questions regarding the procedure were encouraged and answered. The patient understands and consents to the procedure. A time out was performed prior to initiating the procedure. The right abdominal wall was prepped with chlorhexidine in a sterile fashion, and a sterile drape was applied covering the operative field. A sterile gown and sterile gloves were used for the procedure. Ultrasound was used to localize a peritoneal mass in the right upper abdomen. Under ultrasound guidance, a 17 gauge trocar needle was advanced to the level of the mass. Three separate coaxial 18 gauge core biopsy samples were then obtained and submitted in formalin. Additional post biopsy ultrasound was performed. COMPLICATIONS: None. FINDINGS: Hypoechoic rounded soft tissue mass is identified in the anterior right upper quadrant just superficial to the right lobe of the liver. This soft tissue nodule measures approximately 8 x 10 mm by ultrasound. Solid tissue was obtained with core biopsy. IMPRESSION: Ultrasound-guided core biopsy performed of a 10 mm soft tissue peritoneal mass in the right upper quadrant just superficial to the right lobe of the liver. The procedure was performed under general anesthesia  immediately following Port-A-Cath placement. Electronically Signed   By: Aletta Edouard M.D.   On: 05/18/2022 11:47   IR IMAGING GUIDED PORT INSERTION  Result Date: 05/18/2022 CLINICAL DATA:  Left ovarian mass and peritoneal carcinomatosis. Need for porta cath for chemotherapy. EXAM: IMPLANTED PORT A CATH PLACEMENT WITH ULTRASOUND AND FLUOROSCOPIC GUIDANCE ANESTHESIA/SEDATION: General anesthesia FLUOROSCOPY: 22 seconds.  1.0 mGy. PROCEDURE: The procedure, risks, benefits, and alternatives were explained to the patient. Questions regarding the procedure were encouraged and answered. The patient understands and consents to the procedure. A time-out was performed prior to initiating the procedure. Ultrasound was utilized to confirm patency of the right internal jugular vein. A permanent ultrasound image was recorded and saved. The right neck and chest were prepped with chlorhexidine in a sterile fashion, and a sterile drape was applied covering the operative field. Maximum barrier sterile technique with sterile gowns and gloves were used for the procedure. Local anesthesia was provided with 1% lidocaine. After creating a small venotomy incision, a 21 gauge needle was advanced into the right internal jugular vein under direct, real-time ultrasound guidance. Ultrasound image documentation was performed. After securing guidewire access, an 8 Fr dilator was placed. A J-wire was kinked to measure appropriate catheter length. A subcutaneous port pocket was then created along the upper chest wall utilizing sharp and blunt dissection. Portable cautery was utilized. The pocket was irrigated with sterile saline. A single lumen power injectable port was chosen for placement. The 8 Fr catheter was tunneled from the port pocket site to the venotomy incision. The port was placed in the pocket. External catheter was trimmed to appropriate length based on guidewire measurement. At the venotomy, an 8 Fr peel-away sheath was placed  over a guidewire. The catheter was then placed through the sheath and the sheath removed. Final catheter positioning was confirmed and documented with a fluoroscopic spot image. The port was accessed with a needle and aspirated and flushed with heparinized saline. The  access needle was removed. The venotomy and port pocket incisions were closed with subcutaneous 3-0 Monocryl and subcuticular 4-0 Vicryl. Dermabond was applied to both incisions. COMPLICATIONS: COMPLICATIONS None FINDINGS: After catheter placement, the tip lies at the cavo-atrial junction. The catheter aspirates normally and is ready for immediate use. IMPRESSION: Placement of single lumen port a cath via right internal jugular vein. The catheter tip lies at the cavo-atrial junction. A power injectable port a cath was placed and is ready for immediate use. Electronically Signed   By: Aletta Edouard M.D.   On: 05/18/2022 11:25   CT BIOPSY  Result Date: 05/09/2022 INDICATION: 67 year old with bulky peritoneal disease in the pelvis and scattered peritoneal nodules in the abdomen. Patient needs a tissue diagnosis. EXAM: ATTEMPTED IMAGE GUIDED BIOPSY OF ABDOMINAL PERITONEAL NODULE Physician: Stephan Minister. Henn, MD MEDICATIONS: Moderate sedation ANESTHESIA/SEDATION: Moderate (conscious) sedation was employed during this procedure. A total of Versed '3mg'$  and fentanyl 150 mcg was administered intravenously at the order of the provider performing the procedure. Total intra-service moderate sedation time: 41 minutes. Patient's level of consciousness and vital signs were monitored continuously by radiology nurse throughout the procedure under the supervision of the provider performing the procedure. COMPLICATIONS: None immediate. PROCEDURE: The procedure was explained to the patient. The risks and benefits of the procedure were discussed and the patient's questions were addressed. Informed consent was obtained from the patient. Patient was placed supine on the CT  scanner. Images through the abdomen are obtained. A peritoneal nodule anterior to the right hepatic lobe was felt to be a safe site for biopsy. This nodule was also visible using ultrasound. The right upper abdomen was prepped with chlorhexidine and sterile field was created. Skin was anesthetized using 1% lidocaine. Unfortunately, the patient was unable to get comfortable throughout the procedure and she was experiencing pain despite moderate sedation. Skin was anesthetized using 1% lidocaine. A small incision was made. Using ultrasound guidance, attempted to direct a 17 gauge coaxial needle into the small nodule in the perihepatic space. Patient had significant pain and discomfort when the needle was advanced into the peritoneal cavity. Attempted fine-needle aspiration with a 22 gauge needle but this was also unsuccessful due to patient's significant breathing and pain discomfort. As a result, the procedure was aborted. Bandage was placed at the puncture site. RADIATION DOSE REDUCTION: This exam was performed according to the departmental dose-optimization program which includes automated exposure control, adjustment of the mA and/or kV according to patient size and/or use of iterative reconstruction technique. FINDINGS: CT images of the abdomen demonstrate multiple small peritoneal nodules in the anterior abdomen. 1.0 cm nodule just anterior to the right hepatic lobe was targeted and was visible with ultrasound. Despite adequate visualization, needle could not be successfully advanced into this nodule due to patient's significant pain and heavy breathing. A biopsy was not obtained. IMPRESSION: Unsuccessful biopsy of an anterior peritoneal nodule. Repeat biopsy will likely require anesthesia assistance and/or laparoscopic approach. Electronically Signed   By: Markus Daft M.D.   On: 05/09/2022 17:20    Microbiology: No results found for this or any previous visit.  Labs: CBC: Recent Labs  Lab 05/29/22 1712  05/31/22 0615 06/01/22 0529  WBC 4.5 3.3* 3.8*  NEUTROABS 2.2  --   --   HGB 12.7 12.3 12.2  HCT 38.6 36.7 36.4  MCV 96.5 95.1 94.3  PLT 262 282 097   Basic Metabolic Panel: Recent Labs  Lab 05/29/22 1712 05/31/22 0615 06/01/22 0529  NA 136  137 139  K 4.0 3.7 3.6  CL 99 100 104  CO2 '29 29 27  '$ GLUCOSE 121* 148* 123*  BUN '13 8 10  '$ CREATININE 0.75 0.84 0.78  CALCIUM 8.7* 8.9 8.8*   Liver Function Tests: Recent Labs  Lab 05/31/22 0615  AST 25  ALT 22  ALKPHOS 62  BILITOT 0.5  PROT 6.8  ALBUMIN 3.5   CBG: No results for input(s): "GLUCAP" in the last 168 hours.  Discharge time spent: approximately 35 minutes spent on discharge counseling, evaluation of patient on day of discharge, and coordination of discharge planning with nursing, social work, pharmacy and case management  Signed: Edwin Dada, MD [facility] 06/01/2022

## 2022-06-01 NOTE — Progress Notes (Signed)
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  She has no bleeding complications   Lower extremity cellulitis I have discussed this with primary service She can be discharged with a course of oral antibiotics I have arranged for outpatient follow-up next week  moderate constipation, resolving She is in agreement for aggressive laxative regimen   Goals of care This is achieved.  She can be discharged today   Plan of care is discussed with primary service I will sign off   All questions were answered. The patient knows to call the clinic with any problems, questions or concerns.   The total time spent in the appointment was 30 minutes encounter with patients including review of chart and various tests results, discussions about plan of care and coordination of care plan  Heath Lark, MD 06/01/2022 9:29 AM  Subjective:  She felt better.  She continues to have bowel movement.  Leg swelling has improved Cellulitis is improving  Objective:  Vitals:   05/31/22 2243 06/01/22 0439  BP: (!) 151/97 (!) 142/91  Pulse: 82 79  Resp: 18 18  Temp: 98.4 F (36.9 C) 98.2 F (36.8 C)  SpO2: 100% 98%     Intake/Output Summary (Last 24 hours) at 06/01/2022 0929 Last data filed at 05/31/2022 1841 Gross per 24 hour  Intake 580 ml  Output --  Net 580 ml     GENERAL:alert, no distress and comfortable SKIN: She has minimal erythematous changes on her legs, much improved compared to yesterday's evaluation NEURO: alert & oriented x 3 with fluent speech, no focal motor/sensory deficits   Labs:  Recent Labs    05/16/22 0951 05/29/22 1712 05/31/22 0615 06/01/22 0529  NA 139 136 137 139  K 4.2 4.0 3.7 3.6  CL 107 99 100 104  CO2 '24 29 29 27  '$ GLUCOSE 117* 121* 148* 123*  BUN '17 13 8 10  '$ CREATININE 0.91 0.75 0.84 0.78  CALCIUM 9.0 8.7* 8.9 8.8*  GFRNONAA >60 >60 >60 >60  PROT 7.3  --  6.8  --   ALBUMIN 3.8  --  3.5  --   AST 17  --  25  --   ALT 20  --  22  --   ALKPHOS 81  --  62  --   BILITOT 0.3  --  0.5  --     Studies:  CT VENOGRAM ABDOMEN PELVIS  Result Date: 05/29/2022 CLINICAL DATA:  Bilateral leg and foot swelling x2 days. EXAM: CT VENOGRAM ABDOMEN AND PELVIS TECHNIQUE: Multi detector CT imaging of the abdomen and pelvis was performed using the standard protocol during bolus administration of intravenous contrast. Multiplanar CT image reconstructions and MIPS were obtained to evaluate the vascular anatomy. RADIATION DOSE REDUCTION: This exam was performed according to the departmental dose-optimization program which includes automated exposure control, adjustment of the mA and/or kV according to patient  size and/or use of iterative reconstruction technique. CONTRAST:  154m OMNIPAQUE IOHEXOL 350 MG/ML SOLN COMPARISON:  April 26, 2022 FINDINGS: Lower chest: No acute abnormality. Hepatobiliary: No focal liver abnormality is seen. No gallstones, gallbladder wall thickening, or biliary dilatation. Pancreas: Unremarkable. No pancreatic ductal dilatation or surrounding inflammatory changes. Spleen: Normal in size without focal abnormality. Adrenals/Urinary Tract: A 2.8 cm x 1.5 cm x 2.3 cm heterogeneous low-attenuation right adrenal mass is seen (approximately 85.23 Hounsfield units). The left adrenal gland is unremarkable. CT kidneys  are normal in size, without focal lesions. 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 No renal calculi are identified. A stable 7 mm focal calcification is seen within the posterior aspect of the urinary bladder wall on the left (axial CT image 70, CT series 2). Stomach/Bowel: Stomach is within normal limits. The appendix is limited in visualization and appears normal (axial CT images 53 through 59, CT series 2. A large amount of stool is seen throughout the colon. No evidence of bowel dilatation or inflammation. Vascular/Lymphatic: Aortic atherosclerosis. No intraluminal filling defects are seen within the venous structures. There is marked severity compression of the distal aspect of the bilateral external iliac veins is noted (axial CT images 53 through 64, CT series 6). This is much more pronounced in severity when compared to the prior study. It should be noted that the vascular structures within the posterior aspect of the pelvis are limited in evaluation secondary to overlying streak artifact from adjacent operative hardware within the lumbar spine. No enlarged abdominal or pelvic lymph nodes. Reproductive: Multiple dilated and tortuous vessels are seen along the lateral aspects of an enlarged, heterogeneous, lobulated uterus. Complex cysts and surrounding soft tissue are again seen within the right adnexa. The largest complex cyst measures approximately 4.2 cm x 2.8 cm x 2.1 cm and contains a 1.3 cm x 1.5 cm x 1.6 cm heterogeneous soft tissue component (approximately 96.50 Hounsfield units). A 5.3 cm x 3.9 cm x 5.6 cm area of heterogeneous soft tissue attenuation is again seen within the left adnexa and is indistinguishable from the adjacent uterus. Other: No abdominal wall hernia or abnormality. No abdominopelvic ascites. Multiple peritoneal soft tissue nodules are seen scattered throughout pelvis. These are present on the  prior study. Musculoskeletal: A chronic compression fracture deformity is seen at the level of L2. Postoperative changes are seen within the mid and lower lumbar spine. Associated streak artifact is seen with subsequently limited evaluation of the adjacent osseous and soft tissue structures. IMPRESSION: 1. Complex bilateral adnexal masses, as described above, consistent with the patient's known malignancy. 2. Subsequent marked severity mass effect on the distal bilateral common iliac veins, increased in severity when compared to the prior exam. 3. Additional findings consistent with peritoneal metastasis within the pelvis. 4. Findings consistent with pelvic congestion syndrome. 5. Postoperative changes within the mid and lower lumbar spine. 6. Aortic atherosclerosis. 7. No evidence of venous thrombus within the abdomen, pelvis or proximal bilateral lower extremities. Aortic Atherosclerosis (ICD10-I70.0). Electronically Signed   By: TVirgina NorfolkM.D.   On: 05/29/2022 22:57   VAS [place] EXTREMITY VENOUS (DVT) (7a-7p)  Result Date: 05/29/2022  Lower Venous DVT Study Patient Name:  TRANESHIA DERICK Date of Exam:   05/29/2022 Medical Rec #: [id number]       Accession #:    [id number] of Birth: [birth year]      Patient  Gender: F Patient Age:   44 years Exam Location:  [facility] Procedure:      VAS [place] LOWER EXTREMITY VENOUS (DVT) Referring Phys: ADAM CURATOLO --------------------------------------------------------------------------------  Indications: Pain.  Comparison Study: no prior Performing Technologist: Archie Patten RVS  Examination Guidelines: A complete evaluation includes B-mode imaging, spectral Doppler, color Doppler, and power Doppler as needed of all accessible portions of each vessel. Bilateral testing is considered an integral part of a complete examination. Limited examinations for reoccurring indications may be performed as noted. The reflux portion of the exam is performed  with the patient in reverse Trendelenburg.  +-----+---------------+---------+-----------+----------+--------------+ RIGHTCompressibilityPhasicitySpontaneityPropertiesThrombus Aging +-----+---------------+---------+-----------+----------+--------------+ CFV  Full           Yes      Yes                                 +-----+---------------+---------+-----------+----------+--------------+   +---------+---------------+---------+-----------+----------+--------------+ LEFT     CompressibilityPhasicitySpontaneityPropertiesThrombus Aging +---------+---------------+---------+-----------+----------+--------------+ CFV      Full           Yes      Yes                                 +---------+---------------+---------+-----------+----------+--------------+ SFJ      Full                                                        +---------+---------------+---------+-----------+----------+--------------+ FV Prox  Full                                                        +---------+---------------+---------+-----------+----------+--------------+ FV Mid   Full                                                        +---------+---------------+---------+-----------+----------+--------------+ FV DistalFull                                                        +---------+---------------+---------+-----------+----------+--------------+ PFV      Full                                                        +---------+---------------+---------+-----------+----------+--------------+ POP      Full           Yes      Yes                                 +---------+---------------+---------+-----------+----------+--------------+ PTV      Full                                                        +---------+---------------+---------+-----------+----------+--------------+  PERO     Full           Yes      Yes                                  +---------+---------------+---------+-----------+----------+--------------+     Summary: RIGHT: - No evidence of common femoral vein obstruction.  LEFT: - There is no evidence of deep vein thrombosis in the lower extremity.  - No cystic structure found in the popliteal fossa.  *See table(s) above for measurements and observations. Electronically signed by Servando Snare MD on 05/29/2022 at 7:48:58 PM.    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 Failed attempt at CT-guided biopsy under conscious sedation on 05/09/2022 for due to patient anxiety, pain and rapid respiratory rate. EXAM: ULTRASOUND GUIDED CORE BIOPSY OF PERITONEAL MASS MEDICATIONS: The patient was under general anesthesia and the biopsy procedure immediately followed a port placement. PROCEDURE: The procedure, risks, benefits, and alternatives were explained to the patient. Questions regarding the procedure were encouraged and answered. The patient understands and consents to the procedure. A time out was performed prior to initiating the procedure. The right abdominal wall was prepped with chlorhexidine in a sterile fashion, and a sterile drape was applied covering the operative field. A sterile gown and sterile gloves were used for the procedure. Ultrasound was used to localize a peritoneal mass in the right upper abdomen. Under ultrasound guidance, a 17 gauge trocar needle was advanced to the level of the mass. Three separate coaxial 18 gauge core biopsy samples were then obtained and submitted in formalin. Additional post biopsy ultrasound was performed. COMPLICATIONS: None. FINDINGS: Hypoechoic rounded soft tissue mass is identified in the anterior right upper quadrant just superficial to the right lobe of the liver. This soft tissue nodule measures approximately 8 x 10 mm by ultrasound. Solid tissue was obtained with core biopsy. IMPRESSION:  Ultrasound-guided core biopsy performed of a 10 mm soft tissue peritoneal mass in the right upper quadrant just superficial to the right lobe of the liver. The procedure was performed under general anesthesia immediately following Port-A-Cath placement. Electronically Signed   By: Aletta Edouard M.D.   On: 05/18/2022 11:47   IR IMAGING GUIDED PORT INSERTION  Result Date: 05/18/2022 CLINICAL DATA:  Left ovarian mass and peritoneal carcinomatosis. Need for porta cath for chemotherapy. EXAM: IMPLANTED PORT A CATH PLACEMENT WITH ULTRASOUND AND FLUOROSCOPIC GUIDANCE ANESTHESIA/SEDATION: General anesthesia FLUOROSCOPY: 22 seconds.  1.0 mGy. PROCEDURE: The procedure, risks, benefits, and alternatives were explained to the patient. Questions regarding the procedure were encouraged and answered. The patient understands and consents to the procedure. A time-out was performed prior to initiating the procedure. Ultrasound was utilized to confirm patency of the right internal jugular vein. A permanent ultrasound image was recorded and saved. The right neck and chest were prepped with chlorhexidine in a sterile fashion, and a sterile drape was applied covering the operative field. Maximum barrier sterile technique with sterile gowns and gloves were used for the procedure. Local anesthesia was provided with 1% lidocaine. After creating a small venotomy incision, a 21 gauge needle was advanced into the right internal jugular vein under direct, real-time ultrasound guidance. Ultrasound image documentation was performed. After securing guidewire access, an 8 Fr dilator was placed. A J-wire was kinked to measure appropriate catheter length.  A subcutaneous port pocket was then created along the upper chest wall utilizing sharp and blunt dissection. Portable cautery was utilized. The pocket was irrigated with sterile saline. A single lumen power injectable port was chosen for placement. The 8 Fr catheter was tunneled from the port  pocket site to the venotomy incision. The port was placed in the pocket. External catheter was trimmed to appropriate length based on guidewire measurement. At the venotomy, an 8 Fr peel-away sheath was placed over a guidewire. The catheter was then placed through the sheath and the sheath removed. Final catheter positioning was confirmed and documented with a fluoroscopic spot image. The port was accessed with a needle and aspirated and flushed with heparinized saline. The access needle was removed. The venotomy and port pocket incisions were closed with subcutaneous 3-0 Monocryl and subcuticular 4-0 Vicryl. Dermabond was applied to both incisions. COMPLICATIONS: COMPLICATIONS None FINDINGS: After catheter placement, the tip lies at the cavo-atrial junction. The catheter aspirates normally and is ready for immediate use. IMPRESSION: Placement of single lumen port a cath via right internal jugular vein. The catheter tip lies at the cavo-atrial junction. A power injectable port a cath was placed and is ready for immediate use. Electronically Signed   By: Aletta Edouard M.D.   On: 05/18/2022 11:25   CT BIOPSY  Result Date: 05/09/2022 INDICATION: 67 year old with bulky peritoneal disease in the pelvis and scattered peritoneal nodules in the abdomen. Patient needs a tissue diagnosis. EXAM: ATTEMPTED IMAGE GUIDED BIOPSY OF ABDOMINAL PERITONEAL NODULE Physician: Stephan Minister. Henn, MD MEDICATIONS: Moderate sedation ANESTHESIA/SEDATION: Moderate (conscious) sedation was employed during this procedure. A total of Versed '3mg'$  and fentanyl 150 mcg was administered intravenously at the order of the provider performing the procedure. Total intra-service moderate sedation time: 41 minutes. Patient's level of consciousness and vital signs were monitored continuously by radiology nurse throughout the procedure under the supervision of the provider performing the procedure. COMPLICATIONS: None immediate. PROCEDURE: The procedure was  explained to the patient. The risks and benefits of the procedure were discussed and the patient's questions were addressed. Informed consent was obtained from the patient. Patient was placed supine on the CT scanner. Images through the abdomen are obtained. A peritoneal nodule anterior to the right hepatic lobe was felt to be a safe site for biopsy. This nodule was also visible using ultrasound. The right upper abdomen was prepped with chlorhexidine and sterile field was created. Skin was anesthetized using 1% lidocaine. Unfortunately, the patient was unable to get comfortable throughout the procedure and she was experiencing pain despite moderate sedation. Skin was anesthetized using 1% lidocaine. A small incision was made. Using ultrasound guidance, attempted to direct a 17 gauge coaxial needle into the small nodule in the perihepatic space. Patient had significant pain and discomfort when the needle was advanced into the peritoneal cavity. Attempted fine-needle aspiration with a 22 gauge needle but this was also unsuccessful due to patient's significant breathing and pain discomfort. As a result, the procedure was aborted. Bandage was placed at the puncture site. RADIATION DOSE REDUCTION: This exam was performed according to the departmental dose-optimization program which includes automated exposure control, adjustment of the mA and/or kV according to patient size and/or use of iterative reconstruction technique. FINDINGS: CT images of the abdomen demonstrate multiple small peritoneal nodules in the anterior abdomen. 1.0 cm nodule just anterior to the right hepatic lobe was targeted and was visible with ultrasound. Despite adequate visualization, needle could not be successfully advanced into this nodule  due to patient's significant pain and heavy breathing. A biopsy was not obtained. IMPRESSION: Unsuccessful biopsy of an anterior peritoneal nodule. Repeat biopsy will likely require anesthesia assistance and/or  laparoscopic approach. Electronically Signed   By: Markus Daft M.D.   On: 05/09/2022 17:20

## 2022-06-02 ENCOUNTER — Inpatient Hospital Stay: Payer: 59 | Attending: Hematology and Oncology

## 2022-06-02 ENCOUNTER — Other Ambulatory Visit: Payer: Self-pay

## 2022-06-02 VITALS — BP 166/93 | HR 87 | Temp 98.5°F | Resp 18 | Ht 69.0 in | Wt 167.8 lb

## 2022-06-02 DIAGNOSIS — C563 Malignant neoplasm of bilateral ovaries: Secondary | ICD-10-CM

## 2022-06-02 DIAGNOSIS — C482 Malignant neoplasm of peritoneum, unspecified: Secondary | ICD-10-CM | POA: Insufficient documentation

## 2022-06-02 DIAGNOSIS — Z5111 Encounter for antineoplastic chemotherapy: Secondary | ICD-10-CM | POA: Diagnosis not present

## 2022-06-02 MED ORDER — SODIUM CHLORIDE 0.9 % IV SOLN
175.0000 mg/m2 | Freq: Once | INTRAVENOUS | Status: AC
  Administered 2022-06-02: 342 mg via INTRAVENOUS
  Filled 2022-06-02: qty 57

## 2022-06-02 MED ORDER — PALONOSETRON HCL INJECTION 0.25 MG/5ML
0.2500 mg | Freq: Once | INTRAVENOUS | Status: AC
  Administered 2022-06-02: 0.25 mg via INTRAVENOUS
  Filled 2022-06-02: qty 5

## 2022-06-02 MED ORDER — SODIUM CHLORIDE 0.9% FLUSH
10.0000 mL | INTRAVENOUS | Status: DC | PRN
  Administered 2022-06-02: 10 mL

## 2022-06-02 MED ORDER — SODIUM CHLORIDE 0.9 % IV SOLN
681.0000 mg | Freq: Once | INTRAVENOUS | Status: AC
  Administered 2022-06-02: 700 mg via INTRAVENOUS
  Filled 2022-06-02: qty 70

## 2022-06-02 MED ORDER — SODIUM CHLORIDE 0.9 % IV SOLN: Freq: Once | INTRAVENOUS | Status: AC

## 2022-06-02 MED ORDER — SODIUM CHLORIDE 0.9 % IV SOLN
10.0000 mg | Freq: Once | INTRAVENOUS | Status: AC
  Administered 2022-06-02: 10 mg via INTRAVENOUS
  Filled 2022-06-02: qty 10

## 2022-06-02 MED ORDER — SODIUM CHLORIDE 0.9 % IV SOLN
150.0000 mg | Freq: Once | INTRAVENOUS | Status: AC
  Administered 2022-06-02: 150 mg via INTRAVENOUS
  Filled 2022-06-02: qty 150

## 2022-06-02 MED ORDER — FAMOTIDINE IN NACL 20-0.9 MG/50ML-% IV SOLN
20.0000 mg | Freq: Once | INTRAVENOUS | Status: AC
  Administered 2022-06-02: 20 mg via INTRAVENOUS
  Filled 2022-06-02: qty 50

## 2022-06-02 MED ORDER — HEPARIN SOD (PORK) LOCK FLUSH 100 UNIT/ML IV SOLN
500.0000 [IU] | Freq: Once | INTRAVENOUS | Status: AC | PRN
  Administered 2022-06-02: 500 [IU]

## 2022-06-02 MED ORDER — CETIRIZINE HCL 10 MG/ML IV SOLN
10.0000 mg | Freq: Once | INTRAVENOUS | Status: AC
  Administered 2022-06-02: 10 mg via INTRAVENOUS
  Filled 2022-06-02: qty 1

## 2022-06-02 NOTE — Patient Instructions (Signed)
[facility]  Discharge Instructions: Thank you for choosing [facility] to provide your oncology and hematology care.   If you have a lab appointment with the Cancer Center, please go directly to the Cancer Center and check in at the registration area.   Wear comfortable clothing and clothing appropriate for easy access to any Portacath or PICC line.   We strive to give you quality time with your provider. You may need to reschedule your appointment if you arrive late (15 or more minutes).  Arriving late affects you and other patients whose appointments are after yours.  Also, if you miss three or more appointments without notifying the office, you may be dismissed from the clinic at the provider's discretion.      For prescription refill requests, have your pharmacy contact our office and allow 72 hours for refills to be completed.    Today you received the following chemotherapy and/or immunotherapy agents: Paclitaxel, Carboplatin      To help prevent nausea and vomiting after your treatment, we encourage you to take your nausea medication as directed.  BELOW ARE SYMPTOMS THAT SHOULD BE REPORTED IMMEDIATELY: *FEVER GREATER THAN 100.4 F (38 C) OR HIGHER *CHILLS OR SWEATING *NAUSEA AND VOMITING THAT IS NOT CONTROLLED WITH YOUR NAUSEA MEDICATION *UNUSUAL SHORTNESS OF BREATH *UNUSUAL BRUISING OR BLEEDING *URINARY PROBLEMS (pain or burning when urinating, or frequent urination) *BOWEL PROBLEMS (unusual diarrhea, constipation, pain near the anus) TENDERNESS IN MOUTH AND THROAT WITH OR WITHOUT PRESENCE OF ULCERS (sore throat, sores in mouth, or a toothache) UNUSUAL RASH, SWELLING OR PAIN  UNUSUAL VAGINAL DISCHARGE OR ITCHING   Items with * indicate a potential emergency and should be followed up as soon as possible or go to the Emergency Department if any problems should occur.  Please show the CHEMOTHERAPY ALERT CARD or IMMUNOTHERAPY ALERT  CARD at check-in to the Emergency Department and triage nurse.  Should you have questions after your visit or need to cancel or reschedule your appointment, please contact [facility]  Dept: [phone]  and follow the prompts.  Office hours are 8:00 a.m. to 4:30 p.m. Monday - Friday. Please note that voicemails left after 4:00 p.m. may not be returned until the following business day.  We are closed weekends and major holidays. You have access to a nurse at all times for urgent questions. Please call the main number to the clinic Dept: [phone] and follow the prompts.   For any non-urgent questions, you may also contact your provider using MyChart. We now offer e-Visits for anyone 18 and older to request care online for non-urgent symptoms. For details visit [email or web address].   Also download the MyChart app! Go to the app store, search "MyChart", open the app, select [facility], and log in with your MyChart username and password.  Paclitaxel Injection What is this medication? PACLITAXEL (PAK li TAX el) treats some types of cancer. It works by slowing down the growth of cancer cells. This medicine may be used for other purposes; ask your health care provider or pharmacist if you have questions. COMMON BRAND NAME(S): Onxol, Taxol What should I tell my care team before I take this medication? They need to know if you have any of these conditions: Heart disease Liver disease Low white blood cell levels An unusual or allergic reaction to paclitaxel, other medications, foods, dyes, or preservatives If you or your partner are pregnant or trying to   get pregnant Breast-feeding How should I use this medication? This medication is injected into a vein. It is given by your care team in a hospital or clinic setting. Talk to your care team about the use of this medication in children. While it may be given to children for selected conditions, precautions  do apply. Overdosage: If you think you have taken too much of this medicine contact a poison control center or emergency room at once. NOTE: This medicine is only for you. Do not share this medicine with others. What if I miss a dose? Keep appointments for follow-up doses. It is important not to miss your dose. Call your care team if you are unable to keep an appointment. What may interact with this medication? Do not take this medication with any of the following: Live virus vaccines Other medications may affect the way this medication works. Talk with your care team about all of the medications you take. They may suggest changes to your treatment plan to lower the risk of side effects and to make sure your medications work as intended. This list may not describe all possible interactions. Give your health care provider a list of all the medicines, herbs, non-prescription drugs, or dietary supplements you use. Also tell them if you smoke, drink alcohol, or use illegal drugs. Some items may interact with your medicine. What should I watch for while using this medication? Your condition will be monitored carefully while you are receiving this medication. You may need blood work while taking this medication. This medication may make you feel generally unwell. This is not uncommon as chemotherapy can affect healthy cells as well as cancer cells. Report any side effects. Continue your course of treatment even though you feel ill unless your care team tells you to stop. This medication can cause serious allergic reactions. To reduce the risk, your care team may give you other medications to take before receiving this one. Be sure to follow the directions from your care team. This medication may increase your risk of getting an infection. Call your care team for advice if you get a fever, chills, sore throat, or other symptoms of a cold or flu. Do not treat yourself. Try to avoid being around people who are  sick. This medication may increase your risk to bruise or bleed. Call your care team if you notice any unusual bleeding. Be careful brushing or flossing your teeth or using a toothpick because you may get an infection or bleed more easily. If you have any dental work done, tell your dentist you are receiving this medication. Talk to your care team if you may be pregnant. Serious birth defects can occur if you take this medication during pregnancy. Talk to your care team before breastfeeding. Changes to your treatment plan may be needed. What side effects may I notice from receiving this medication? Side effects that you should report to your care team as soon as possible: Allergic reactions--skin rash, itching, hives, swelling of the face, lips, tongue, or throat Heart rhythm changes--fast or irregular heartbeat, dizziness, feeling faint or lightheaded, chest pain, trouble breathing Increase in blood pressure Infection--fever, chills, cough, sore throat, wounds that don't heal, pain or trouble when passing urine, general feeling of discomfort or being unwell Low blood pressure--dizziness, feeling faint or lightheaded, blurry vision Low red blood cell level--unusual weakness or fatigue, dizziness, headache, trouble breathing Painful swelling, warmth, or redness of the skin, blisters or sores at the infusion site Pain, tingling, or numbness   in the hands or feet Slow heartbeat--dizziness, feeling faint or lightheaded, confusion, trouble breathing, unusual weakness or fatigue Unusual bruising or bleeding Side effects that usually do not require medical attention (report to your care team if they continue or are bothersome): Diarrhea Hair loss Joint pain Loss of appetite Muscle pain Nausea Vomiting This list may not describe all possible side effects. Call your doctor for medical advice about side effects. You may report side effects to FDA at 1-800-FDA-1088. Where should I keep my  medication? This medication is given in a hospital or clinic. It will not be stored at home. NOTE: This sheet is a summary. It may not cover all possible information. If you have questions about this medicine, talk to your doctor, pharmacist, or health care provider.  2023 Elsevier/Gold Standard (2021-08-24 00:00:00)  Carboplatin Injection What is this medication? CARBOPLATIN (KAR boe pla tin) treats some types of cancer. It works by slowing down the growth of cancer cells. This medicine may be used for other purposes; ask your health care provider or pharmacist if you have questions. COMMON BRAND NAME(S): Paraplatin What should I tell my care team before I take this medication? They need to know if you have any of these conditions: Blood disorders Hearing problems Kidney disease Recent or ongoing radiation therapy An unusual or allergic reaction to carboplatin, cisplatin, other medications, foods, dyes, or preservatives Pregnant or trying to get pregnant Breast-feeding How should I use this medication? This medication is injected into a vein. It is given by your care team in a hospital or clinic setting. Talk to your care team about the use of this medication in children. Special care may be needed. Overdosage: If you think you have taken too much of this medicine contact a poison control center or emergency room at once. NOTE: This medicine is only for you. Do not share this medicine with others. What if I miss a dose? Keep appointments for follow-up doses. It is important not to miss your dose. Call your care team if you are unable to keep an appointment. What may interact with this medication? Medications for seizures Some antibiotics, such as amikacin, gentamicin, neomycin, streptomycin, tobramycin Vaccines This list may not describe all possible interactions. Give your health care provider a list of all the medicines, herbs, non-prescription drugs, or dietary supplements you use.  Also tell them if you smoke, drink alcohol, or use illegal drugs. Some items may interact with your medicine. What should I watch for while using this medication? Your condition will be monitored carefully while you are receiving this medication. You may need blood work while taking this medication. This medication may make you feel generally unwell. This is not uncommon, as chemotherapy can affect healthy cells as well as cancer cells. Report any side effects. Continue your course of treatment even though you feel ill unless your care team tells you to stop. In some cases, you may be given additional medications to help with side effects. Follow all directions for their use. This medication may increase your risk of getting an infection. Call your care team for advice if you get a fever, chills, sore throat, or other symptoms of a cold or flu. Do not treat yourself. Try to avoid being around people who are sick. Avoid taking medications that contain aspirin, acetaminophen, ibuprofen, naproxen, or ketoprofen unless instructed by your care team. These medications may hide a fever. Be careful brushing or flossing your teeth or using a toothpick because you may get an   infection or bleed more easily. If you have any dental work done, tell your dentist you are receiving this medication. Talk to your care team if you wish to become pregnant or think you might be pregnant. This medication can cause serious birth defects. Talk to your care team about effective forms of contraception. Do not breast-feed while taking this medication. What side effects may I notice from receiving this medication? Side effects that you should report to your care team as soon as possible: Allergic reactions--skin rash, itching, hives, swelling of the face, lips, tongue, or throat Infection--fever, chills, cough, sore throat, wounds that don't heal, pain or trouble when passing urine, general feeling of discomfort or being  unwell Low red blood cell level--unusual weakness or fatigue, dizziness, headache, trouble breathing Pain, tingling, or numbness in the hands or feet, muscle weakness, change in vision, confusion or trouble speaking, loss of balance or coordination, trouble walking, seizures Unusual bruising or bleeding Side effects that usually do not require medical attention (report to your care team if they continue or are bothersome): Hair loss Nausea Unusual weakness or fatigue Vomiting This list may not describe all possible side effects. Call your doctor for medical advice about side effects. You may report side effects to FDA at 1-800-FDA-1088. Where should I keep my medication? This medication is given in a hospital or clinic. It will not be stored at home. NOTE: This sheet is a summary. It may not cover all possible information. If you have questions about this medicine, talk to your doctor, pharmacist, or health care provider.  2023 Elsevier/Gold Standard (2021-08-08 00:00:00)

## 2022-06-06 ENCOUNTER — Other Ambulatory Visit: Payer: Self-pay

## 2022-06-06 ENCOUNTER — Telehealth: Payer: Self-pay

## 2022-06-06 MED ORDER — PREDNISONE 20 MG PO TABS: 20.0000 mg | ORAL_TABLET | Freq: Every day | ORAL | 0 refills | Status: DC

## 2022-06-06 NOTE — Telephone Encounter (Signed)
Called and left a message asking her to call the office back. 

## 2022-06-06 NOTE — Telephone Encounter (Signed)
We can prescribe low dose prednisone 20 mg; take with food, it should resolve in 2-3 days Call in 30 tabs, no refills Ask her to call back on Thursday

## 2022-06-06 NOTE — Telephone Encounter (Signed)
Returned her call. She started having severe joint pain yesterday. She tried taking tylenol to see if that helps. She takes Oxycodone 20 mg  4-5 times per day and Oxycodone extended 20 mg BID. She gets the medications from neurologist and has a pain contract regarding pain medications.  She is asking for your recommendation? Should she call the MD she gets pain Rx from?

## 2022-06-06 NOTE — Telephone Encounter (Signed)
Called and left another message asking her to call the office back.  

## 2022-06-06 NOTE — Telephone Encounter (Signed)
She called back. Given below message from Dr. Alvy Bimler. Sent Prednisone to preferred pharmacy. She verbalized understanding and will call the office back on Thursday to give update.

## 2022-06-09 ENCOUNTER — Inpatient Hospital Stay: Payer: 59 | Attending: Gynecologic Oncology | Admitting: Hematology and Oncology

## 2022-06-09 ENCOUNTER — Encounter: Payer: Self-pay | Admitting: Hematology and Oncology

## 2022-06-09 ENCOUNTER — Other Ambulatory Visit: Payer: Self-pay

## 2022-06-09 VITALS — BP 159/93 | HR 85 | Temp 98.2°F | Resp 18 | Ht 69.0 in | Wt 179.6 lb

## 2022-06-09 DIAGNOSIS — Z5111 Encounter for antineoplastic chemotherapy: Secondary | ICD-10-CM | POA: Diagnosis not present

## 2022-06-09 DIAGNOSIS — R600 Localized edema: Secondary | ICD-10-CM

## 2022-06-09 DIAGNOSIS — C786 Secondary malignant neoplasm of retroperitoneum and peritoneum: Secondary | ICD-10-CM | POA: Insufficient documentation

## 2022-06-09 DIAGNOSIS — K5909 Other constipation: Secondary | ICD-10-CM

## 2022-06-09 DIAGNOSIS — C563 Malignant neoplasm of bilateral ovaries: Secondary | ICD-10-CM

## 2022-06-09 NOTE — Assessment & Plan Note (Signed)
Her leg edema has improved dramatically Her cellulitis has resolved Due to high risk for DVT, she will continue low-dose prophylactic Eliquis until after surgery

## 2022-06-09 NOTE — Assessment & Plan Note (Addendum)
Her treatment course was complicated by exacerbation of her chronic pain syndrome which has since resolved Her leg edema and cellulitis has resolved Continue supportive care

## 2022-06-09 NOTE — Assessment & Plan Note (Signed)
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

## 2022-06-09 NOTE — Progress Notes (Signed)
[facility] OFFICE PROGRESS NOTE  Patient Care Team: Shirline Frees, MD as PCP - General (Family Medicine)  ASSESSMENT & PLAN:  Ovarian cancer, bilateral [facility]) Her treatment course was complicated by exacerbation of her chronic pain syndrome which has since resolved Her leg edema and cellulitis has resolved Continue supportive care  Other constipation Recent constipation was caused by compressive effect from her tumor With aggressive laxative, her constipation has resolved Clinically, she is much improved I suspect she is responding well to treatment Recommend the patient to continue on laxative therapy  Bilateral leg edema Her leg edema has improved dramatically Her cellulitis has resolved Due to high risk for DVT, she will continue low-dose prophylactic Eliquis until after surgery  No orders of the defined types were placed in this encounter.   All questions were answered. The patient knows to call the clinic with any problems, questions or concerns. The total time spent in the appointment was 20 minutes encounter with patients including review of chart and various tests results, discussions about plan of care and coordination of care plan   Heath Lark, MD 06/09/2022 1:13 PM  INTERVAL HISTORY: Please see below for problem oriented charting. she returns for treatment follow-up after chemotherapy Her joint pain has resolved Her cellulitis and leg edema has resolved She felt great Constipation has resolved She felt somewhat a little bloated but denies nausea She is eating well  REVIEW OF SYSTEMS:   Constitutional: Denies fevers, chills or abnormal weight loss Eyes: Denies blurriness of vision Ears, nose, mouth, throat, and face: Denies mucositis or sore throat Respiratory: Denies cough, dyspnea or wheezes Cardiovascular: Denies palpitation, chest discomfort or lower extremity swelling Gastrointestinal:  Denies nausea, heartburn or change in bowel  habits Skin: Denies abnormal skin rashes Lymphatics: Denies new lymphadenopathy or easy bruising Neurological:Denies numbness, tingling or new weaknesses Behavioral/Psych: Mood is stable, no new changes  All other systems were reviewed with the patient and are negative.  I have reviewed the past medical history, past surgical history, social history and family history with the patient and they are unchanged from previous note.  ALLERGIES:  is allergic to cymbalta [duloxetine hcl], cyclobenzaprine hcl, celexa  [citalopram hydrobromide], fluticasone furoate-vilanterol, gabapentin, opana  [oxymorphone hcl], and tramadol.  MEDICATIONS:  Current Outpatient Medications  Medication Sig Dispense Refill   apixaban (ELIQUIS) 2.5 MG TABS tablet Take 1 tablet (2.5 mg total) by mouth 2 (two) times daily. 60 tablet 11   albuterol (VENTOLIN HFA) 108 (90 Base) MCG/ACT inhaler Inhale 2 puffs into the lungs every 6 (six) hours as needed for shortness of breath.     amphetamine-dextroamphetamine (ADDERALL) 20 MG tablet Take 10-20 mg by mouth See admin instructions. Take 20 mg by mouth in the morning and 10 mg between 1-2 PM daily     Ascorbic Acid (VITAMIN C) 1000 MG tablet Take 1,000 mg by mouth once a week.     ASHWAGANDHA PO Take 2 capsules by mouth every 7 (seven) days.     BLACK CURRANT SEED OIL PO Take 5 mLs by mouth every 7 (seven) days.     cyclobenzaprine (FLEXERIL) 5 MG tablet Take 5-10 mg by mouth 3 (three) times daily as needed for muscle spasms.     dexamethasone (DECADRON) 4 MG tablet Take 2 tabs at the night before and 2 tab the morning of chemotherapy, every 3 weeks, by mouth x 6 cycles (Patient not taking: Reported on 05/30/2022) 24 tablet 6   diclofenac Sodium (VOLTAREN) 1 %  GEL Apply 2 g topically daily as needed (to affected areas- for pain).     hydroxychloroquine (PLAQUENIL) 200 MG tablet Take 200 mg by mouth daily.     irbesartan (AVAPRO) 300 MG tablet Take 300 mg by mouth daily.  3    lidocaine-prilocaine (EMLA) cream Apply to affected area once (Patient not taking: Reported on 05/30/2022) 30 g 3   LORazepam (ATIVAN) 0.5 MG tablet Take 0.5 mg by mouth 3 (three) times daily.     meclizine (ANTIVERT) 12.5 MG tablet Take 12.5 mg by mouth 3 (three) times daily as needed for dizziness.     Melatonin 10 MG TABS Take 10 mg by mouth at bedtime as needed (sleep).     Multiple Vitamin (MULTIVITAMIN WITH MINERALS) TABS tablet Take 1 tablet by mouth daily.     mupirocin ointment (BACTROBAN) 2 % Apply 1 Application topically 3 (three) times daily as needed (boils).     naloxone (NARCAN) nasal spray 4 mg/0.1 mL Place 1 spray into the nose as needed (opioid overdose).     omeprazole (PRILOSEC OTC) 20 MG tablet Take 20 mg by mouth daily as needed (for acid reflux).     ondansetron (ZOFRAN) 8 MG tablet Take 1 tablet (8 mg total) by mouth every 8 (eight) hours as needed for nausea or vomiting. Start on the third day after chemotherapy. (Patient not taking: Reported on 05/30/2022) 30 tablet 1   OVER THE COUNTER MEDICATION Take 2 capsules by mouth daily. Sea moss supplement     OVER THE COUNTER MEDICATION Take 2 capsules by mouth See admin instructions. "Liver Focus" capsules- Take 2 capsules by mouth at bedtime     Oxycodone HCl 20 MG TABS Take 20 mg by mouth See admin instructions. Take 20 mg by mouth four to five times a day as needed for pain     polyethylene glycol (MIRALAX / GLYCOLAX) 17 g packet Take 17 g by mouth daily.     POTASSIUM PO Take 1 tablet by mouth every 7 (seven) days.     prochlorperazine (COMPAZINE) 10 MG tablet Take 1 tablet (10 mg total) by mouth every 6 (six) hours as needed for nausea or vomiting. (Patient not taking: Reported on 05/30/2022) 30 tablet 1   TURMERIC CURCUMIN PO Take 1,000 mg by mouth daily.     VITAMIN A PALMITATE PO Take by mouth.     VITAMIN A-BETA CAROTENE PO Take 2,400 Units by mouth once a week.     Vitamin D, Ergocalciferol, (DRISDOL) 1.25 MG (50000 UNIT)  CAPS capsule Take 50,000 Units by mouth every Sunday.     WIXELA INHUB 250-50 MCG/ACT AEPB Inhale 1 puff into the lungs daily.     XTAMPZA ER 9 MG C12A Take 9 mg by mouth 2 (two) times daily.     No current facility-administered medications for this visit.    SUMMARY OF ONCOLOGIC HISTORY: Oncology History Overview Note  High grade serous   Ovarian cancer, bilateral ([place])  04/11/2022 Imaging   CT of the abdomen and pelvis on 04/11/2022 reveals a left adnexal masslike area measuring 5.9 x 3.6 cm.  Margins of this mass are irregular.  There is small fluid in the cul-de-sac as well as fullness of soft tissue about the right adnexa.  Discrete uterine structure is not identified.  Left upper quadrant nodules beneath the left hemidiaphragm (1 anterior to the spleen and the other to the stomach).  Omental/peritoneal nodularity and multiple perihepatic nodules are noted  04/18/2022 Tumor Marker   Patient's tumor was tested for the following markers: CA-125. Results of the tumor marker test revealed 123.   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 Bulky bilateral ovarian masses and nodules, left-greater-than-right, which are essentially confluent with adjacent pelvic soft tissue nodularity and not significantly changed compared to prior examination dated 04/11/2022. 2. Extensive pelvic peritoneal thickening and nodularity. Multiple small peritoneal nodules throughout the abdomen and pelvis. Findings are consistent with peritoneal metastatic disease and likewise not significantly changed. 3. Small volume of loculated appearing fluid in the low pelvis. 4. 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 The mid to distal right ureter is obstructed by right ovarian mass and or soft tissue nodularity. 5. Calculus within the most distal left ureterovesicular junction or just  within the bladder lumen measuring 0.7 cm, unchanged. No associated left-sided hydronephrosis. 6. Soft tissue attenuation nodule of the body of the right adrenal gland. Notably, this was also soft tissue attenuation on prior noncontrast examination (i.e. not definitively macroscopic fat containing) although unchanged compared to prior examinations dating back to 2130 and almost certainly a benign adenoma. Attention on follow-up. 7. No evidence of metastatic disease in the chest. 8. Mild diffuse bilateral bronchial wall thickening. Background of very fine centrilobular nodularity, most concentrated in the lung apices. Findings are most consistent with smoking-related respiratory bronchiolitis.     05/18/2022 Procedure   Ultrasound-guided core biopsy performed of a 10 mm soft tissue peritoneal mass in the right upper quadrant just superficial to the right lobe of the liver. The procedure was performed under general anesthesia immediately following Port-A-Cath placement.   05/18/2022 Procedure   Placement of single lumen port a cath via right internal jugular vein. The catheter tip lies at the cavo-atrial junction. A power injectable port a cath was placed and is ready for immediate use.     05/18/2022 Pathology Results   A. 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  The findings support the above interpretation.  This case was reviewed with Dr. Vic Ripper who agrees with the above diagnosis.  A p16 stain is pending and will be reported in an addendum.    05/23/2022 Cancer Staging   Staging form: Ovary, Fallopian Tube, and Primary Peritoneal Carcinoma, AJCC 8th Edition - Clinical stage from 05/23/2022: FIGO Stage IIIC (cT3c, cN0, cM0) - Signed by Heath Lark, MD on 05/23/2022 Stage prefix: Initial diagnosis   05/29/2022 Imaging   1.  Complex bilateral adnexal masses, as described above, consistent with the patient's known malignancy. 2. Subsequent marked severity mass effect on the distal bilateral common iliac veins, increased in severity when compared to the prior exam. 3. Additional findings consistent with peritoneal metastasis within the pelvis. 4. Findings consistent with pelvic congestion syndrome. 5. Postoperative changes within the mid and lower lumbar spine. 6. Aortic atherosclerosis. 7. No evidence of venous thrombus within the abdomen, pelvis or proximal bilateral lower extremities.   Aortic Atherosclerosis (ICD10-I70.0).   06/02/2022 -  Chemotherapy   Patient is on Treatment Plan : OVARIAN Carboplatin (AUC 6) + Paclitaxel (175) q21d X 6 Cycles       PHYSICAL EXAMINATION: ECOG PERFORMANCE STATUS: 0 - Asymptomatic  Vitals:   06/09/22 1257  BP: (!) 159/93  Pulse: 85  Resp: 18  Temp: 98.2 F (36.8 C)  SpO2: 97%   Filed Weights   06/09/22 1257  Weight: 179 lb 9.6 oz (81.5 kg)    GENERAL:alert, no distress and comfortable SKIN: skin color, texture, turgor are normal, no rashes or significant lesions EYES: normal, Conjunctiva are pink and non-injected, sclera clear OROPHARYNX:no exudate, no erythema and lips, buccal mucosa, and tongue normal  NECK: supple, thyroid normal size, non-tender, without nodularity LYMPH:  no palpable lymphadenopathy in the cervical, axillary or inguinal LUNGS: clear to auscultation and percussion with normal breathing effort HEART: regular rate & rhythm and no murmurs and no lower extremity edema ABDOMEN:abdomen soft, non-tender and normal bowel sounds Musculoskeletal:no cyanosis of digits and no clubbing  NEURO: alert & oriented x 3 with fluent speech, no focal motor/sensory deficits  LABORATORY DATA:  I have reviewed the data as listed    Component Value Date/Time   NA 139 06/01/2022 0529   K 3.6 06/01/2022 0529   CL 104 06/01/2022 0529   CO2 27 06/01/2022 0529    GLUCOSE 123 (H) 06/01/2022 0529   BUN 10 06/01/2022 0529   CREATININE 0.78 06/01/2022 0529   CALCIUM 8.8 (L) 06/01/2022 0529   PROT 6.8 05/31/2022 0615   ALBUMIN 3.5 05/31/2022 0615   AST 25 05/31/2022 0615   ALT 22 05/31/2022 0615   ALKPHOS 62 05/31/2022 0615   BILITOT 0.5 05/31/2022 0615   GFRNONAA >60 06/01/2022 0529   GFRAA >60 08/20/2019 1408    No results found for: "SPEP", "UPEP"  Lab Results  Component Value Date   WBC 3.8 (L) 06/01/2022   NEUTROABS 2.2 05/29/2022   HGB 12.2 06/01/2022   HCT 36.4 06/01/2022   MCV 94.3 06/01/2022   PLT 288 06/01/2022      Chemistry      Component Value Date/Time   NA 139 06/01/2022 0529   K 3.6 06/01/2022 0529   CL 104 06/01/2022 0529   CO2 27 06/01/2022 0529   BUN 10 06/01/2022 0529   CREATININE 0.78 06/01/2022 0529      Component Value Date/Time   CALCIUM 8.8 (L) 06/01/2022 0529   ALKPHOS 62 05/31/2022 0615   AST 25 05/31/2022 0615   ALT 22 05/31/2022 0615   BILITOT 0.5 05/31/2022 0615       RADIOGRAPHIC STUDIES: I have personally reviewed the radiological images as listed and agreed with the findings in the report. CT VENOGRAM ABDOMEN PELVIS  Result Date: 05/29/2022 CLINICAL DATA:  Bilateral leg and foot swelling x2 days. EXAM: CT VENOGRAM ABDOMEN AND PELVIS TECHNIQUE: Multi detector CT imaging of the abdomen and pelvis was performed using the standard protocol during bolus administration of intravenous contrast. Multiplanar CT image reconstructions and MIPS were obtained to evaluate the vascular anatomy. RADIATION DOSE REDUCTION: This exam was performed according to the departmental dose-optimization program which includes automated exposure control, adjustment of the mA and/or kV according to patient size and/or use of iterative reconstruction technique. CONTRAST:  157m OMNIPAQUE IOHEXOL 350 MG/ML SOLN COMPARISON:  April 26, 2022 FINDINGS: Lower chest: No acute abnormality. Hepatobiliary: No focal liver abnormality  is seen. No gallstones, gallbladder wall thickening, or biliary dilatation. Pancreas: Unremarkable. No pancreatic ductal dilatation or surrounding inflammatory changes. Spleen: Normal in size without focal abnormality. Adrenals/Urinary Tract: A 2.8 cm x 1.5 cm x 2.3 cm heterogeneous low-attenuation right adrenal mass is seen (approximately 85.23 Hounsfield units). The left adrenal gland is unremarkable. CT kidneys are normal in size, without focal lesions. 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 No renal calculi are identified. A stable 7 mm focal calcification is seen within the posterior aspect of the urinary bladder wall on the left (axial CT image 70, CT series 2). Stomach/Bowel: Stomach is within normal limits. The appendix is limited in visualization and appears normal (axial CT images 53 through 59, CT series 2. A large amount of stool is seen throughout the colon. No evidence of bowel dilatation or inflammation. Vascular/Lymphatic: Aortic atherosclerosis. No intraluminal filling defects are seen within the venous structures. There is marked severity compression of the distal aspect of the bilateral external iliac veins is noted (axial CT images 53 through 64, CT series 6). This is much more pronounced in severity when compared to the prior study. It should be noted that the vascular structures within the posterior aspect of the pelvis are limited in evaluation secondary to overlying streak artifact from adjacent operative hardware within the lumbar spine. No enlarged abdominal or pelvic lymph nodes. Reproductive: Multiple dilated and tortuous vessels are seen along the lateral aspects of an enlarged, heterogeneous, lobulated uterus. Complex cysts and surrounding soft tissue are again seen within the right adnexa. The largest complex cyst measures approximately 4.2 cm x 2.8 cm x 2.1 cm and contains a 1.3 cm x 1.5 cm x 1.6  cm heterogeneous soft tissue component (approximately 96.50 Hounsfield units). A 5.3 cm x 3.9 cm x 5.6 cm area of heterogeneous soft tissue attenuation is again seen within the left adnexa and is indistinguishable from the adjacent uterus. Other: No abdominal wall hernia or abnormality. No abdominopelvic ascites. Multiple peritoneal soft tissue nodules are seen scattered throughout pelvis. These are present on the prior study. Musculoskeletal: A chronic compression fracture deformity is seen at the level of L2. Postoperative changes are seen within the mid and lower lumbar spine. Associated streak artifact is seen with subsequently limited evaluation of the adjacent osseous and soft tissue structures. IMPRESSION: 1. Complex bilateral adnexal masses, as described above, consistent with the patient's known malignancy. 2. Subsequent marked severity mass effect on the distal bilateral common iliac veins, increased in severity when compared to the prior exam. 3. Additional findings consistent with peritoneal metastasis within the pelvis. 4. Findings consistent with pelvic congestion syndrome. 5. Postoperative changes within the mid and lower lumbar spine. 6. Aortic atherosclerosis. 7. No evidence of venous thrombus within the abdomen, pelvis or proximal bilateral lower extremities. Aortic Atherosclerosis (ICD10-I70.0). Electronically Signed   By: Virgina Norfolk M.D.   On: 05/29/2022 22:57   VAS [place] LOWER EXTREMITY VENOUS (DVT) (7a-7p)  Result Date: 05/29/2022  Lower Venous DVT Study Patient Name:  SIMARA RHYNER  Date of Exam:   05/29/2022 Medical Rec #: [id number]        Accession #:    [id number] Date of Birth: 07-31-1955       Patient Gender: F Patient Age:   67 years Exam Location:  [facility] Procedure:      VAS [place] LOWER EXTREMITY VENOUS (DVT) Referring Phys: ADAM CURATOLO --------------------------------------------------------------------------------  Indications: Pain.  Comparison Study: no prior  Performing Technologist: Archie Patten RVS  Examination Guidelines: A complete evaluation includes B-mode imaging, spectral Doppler, color Doppler, and power Doppler as needed of all accessible portions of each vessel. Bilateral testing is considered an integral part of a complete examination. Limited examinations for reoccurring indications may be performed as noted. The reflux portion of the exam is performed  with the patient in reverse Trendelenburg.  +-----+---------------+---------+-----------+----------+--------------+ RIGHTCompressibilityPhasicitySpontaneityPropertiesThrombus Aging +-----+---------------+---------+-----------+----------+--------------+ CFV  Full           Yes      Yes                                 +-----+---------------+---------+-----------+----------+--------------+   +---------+---------------+---------+-----------+----------+--------------+ LEFT     CompressibilityPhasicitySpontaneityPropertiesThrombus Aging +---------+---------------+---------+-----------+----------+--------------+ CFV      Full           Yes      Yes                                 +---------+---------------+---------+-----------+----------+--------------+ SFJ      Full                                                        +---------+---------------+---------+-----------+----------+--------------+ FV Prox  Full                                                        +---------+---------------+---------+-----------+----------+--------------+ FV Mid   Full                                                        +---------+---------------+---------+-----------+----------+--------------+ FV DistalFull                                                        +---------+---------------+---------+-----------+----------+--------------+ PFV      Full                                                         +---------+---------------+---------+-----------+----------+--------------+ POP      Full           Yes      Yes                                 +---------+---------------+---------+-----------+----------+--------------+ PTV      Full                                                        +---------+---------------+---------+-----------+----------+--------------+ PERO     Full           Yes      Yes                                 +---------+---------------+---------+-----------+----------+--------------+  Summary: RIGHT: - No evidence of common femoral vein obstruction.  LEFT: - There is no evidence of deep vein thrombosis in the lower extremity.  - No cystic structure found in the popliteal fossa.  *See table(s) above for measurements and observations. Electronically signed by Servando Snare MD on 05/29/2022 at 7:48:58 PM.    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 Failed attempt at CT-guided biopsy under conscious sedation on 05/09/2022 for due to patient anxiety, pain and rapid respiratory rate. EXAM: ULTRASOUND GUIDED CORE BIOPSY OF PERITONEAL MASS MEDICATIONS: The patient was under general anesthesia and the biopsy procedure immediately followed a port placement. PROCEDURE: The procedure, risks, benefits, and alternatives were explained to the patient. Questions regarding the procedure were encouraged and answered. The patient understands and consents to the procedure. A time out was performed prior to initiating the procedure. The right abdominal wall was prepped with chlorhexidine in a sterile fashion, and a sterile drape was applied covering the operative field. A sterile gown and sterile gloves were used for the procedure. Ultrasound was used to localize a peritoneal mass in the right upper abdomen. Under ultrasound guidance, a 17 gauge trocar needle was advanced to the level of  the mass. Three separate coaxial 18 gauge core biopsy samples were then obtained and submitted in formalin. Additional post biopsy ultrasound was performed. COMPLICATIONS: None. FINDINGS: Hypoechoic rounded soft tissue mass is identified in the anterior right upper quadrant just superficial to the right lobe of the liver. This soft tissue nodule measures approximately 8 x 10 mm by ultrasound. Solid tissue was obtained with core biopsy. IMPRESSION: Ultrasound-guided core biopsy performed of a 10 mm soft tissue peritoneal mass in the right upper quadrant just superficial to the right lobe of the liver. The procedure was performed under general anesthesia immediately following Port-A-Cath placement. Electronically Signed   By: Aletta Edouard M.D.   On: 05/18/2022 11:47   IR IMAGING GUIDED PORT INSERTION  Result Date: 05/18/2022 CLINICAL DATA:  Left ovarian mass and peritoneal carcinomatosis. Need for porta cath for chemotherapy. EXAM: IMPLANTED PORT A CATH PLACEMENT WITH ULTRASOUND AND FLUOROSCOPIC GUIDANCE ANESTHESIA/SEDATION: General anesthesia FLUOROSCOPY: 22 seconds.  1.0 mGy. PROCEDURE: The procedure, risks, benefits, and alternatives were explained to the patient. Questions regarding the procedure were encouraged and answered. The patient understands and consents to the procedure. A time-out was performed prior to initiating the procedure. Ultrasound was utilized to confirm patency of the right internal jugular vein. A permanent ultrasound image was recorded and saved. The right neck and chest were prepped with chlorhexidine in a sterile fashion, and a sterile drape was applied covering the operative field. Maximum barrier sterile technique with sterile gowns and gloves were used for the procedure. Local anesthesia was provided with 1% lidocaine. After creating a small venotomy incision, a 21 gauge needle was advanced into the right internal jugular vein under direct, real-time ultrasound guidance.  Ultrasound image documentation was performed. After securing guidewire access, an 8 Fr dilator was placed. A J-wire was kinked to measure appropriate catheter length. A subcutaneous port pocket was then created along the upper chest wall utilizing sharp and blunt dissection. Portable cautery was utilized. The pocket was irrigated with sterile saline. A single lumen power injectable port was chosen for placement. The 8 Fr catheter was tunneled from the port pocket site to the venotomy incision. The port was placed in the pocket.  External catheter was trimmed to appropriate length based on guidewire measurement. At the venotomy, an 8 Fr peel-away sheath was placed over a guidewire. The catheter was then placed through the sheath and the sheath removed. Final catheter positioning was confirmed and documented with a fluoroscopic spot image. The port was accessed with a needle and aspirated and flushed with heparinized saline. The access needle was removed. The venotomy and port pocket incisions were closed with subcutaneous 3-0 Monocryl and subcuticular 4-0 Vicryl. Dermabond was applied to both incisions. COMPLICATIONS: COMPLICATIONS None FINDINGS: After catheter placement, the tip lies at the cavo-atrial junction. The catheter aspirates normally and is ready for immediate use. IMPRESSION: Placement of single lumen port a cath via right internal jugular vein. The catheter tip lies at the cavo-atrial junction. A power injectable port a cath was placed and is ready for immediate use. Electronically Signed   By: Aletta Edouard M.D.   On: 05/18/2022 11:25

## 2022-06-22 ENCOUNTER — Other Ambulatory Visit: Payer: Self-pay

## 2022-06-22 MED FILL — Fosaprepitant Dimeglumine For IV Infusion 150 MG (Base Eq): INTRAVENOUS | Qty: 5 | Status: AC

## 2022-06-22 MED FILL — Dexamethasone Sodium Phosphate Inj 100 MG/10ML: INTRAMUSCULAR | Qty: 1 | Status: AC

## 2022-06-23 ENCOUNTER — Inpatient Hospital Stay: Payer: 59

## 2022-06-23 ENCOUNTER — Encounter: Payer: Self-pay | Admitting: Hematology and Oncology

## 2022-06-23 ENCOUNTER — Telehealth: Payer: Self-pay

## 2022-06-23 ENCOUNTER — Inpatient Hospital Stay (HOSPITAL_BASED_OUTPATIENT_CLINIC_OR_DEPARTMENT_OTHER): Payer: 59 | Admitting: Hematology and Oncology

## 2022-06-23 VITALS — BP 158/90 | HR 104 | Temp 97.5°F | Resp 18 | Ht 69.0 in | Wt 182.1 lb

## 2022-06-23 VITALS — BP 158/88 | HR 88 | Temp 98.0°F | Resp 18

## 2022-06-23 DIAGNOSIS — Z79891 Long term (current) use of opiate analgesic: Secondary | ICD-10-CM

## 2022-06-23 DIAGNOSIS — C563 Malignant neoplasm of bilateral ovaries: Secondary | ICD-10-CM

## 2022-06-23 DIAGNOSIS — C786 Secondary malignant neoplasm of retroperitoneum and peritoneum: Secondary | ICD-10-CM | POA: Diagnosis not present

## 2022-06-23 DIAGNOSIS — K5909 Other constipation: Secondary | ICD-10-CM

## 2022-06-23 DIAGNOSIS — Z5111 Encounter for antineoplastic chemotherapy: Secondary | ICD-10-CM | POA: Diagnosis not present

## 2022-06-23 DIAGNOSIS — Z95828 Presence of other vascular implants and grafts: Secondary | ICD-10-CM

## 2022-06-23 LAB — CBC WITH DIFFERENTIAL (CANCER CENTER ONLY)
Abs Immature Granulocytes: 0.02 10*3/uL (ref 0.00–0.07)
Basophils Absolute: 0 10*3/uL (ref 0.0–0.1)
Basophils Relative: 1 %
Eosinophils Absolute: 0.1 10*3/uL (ref 0.0–0.5)
Eosinophils Relative: 2 %
HCT: 37.3 % (ref 36.0–46.0)
Hemoglobin: 12.9 g/dL (ref 12.0–15.0)
Immature Granulocytes: 1 %
Lymphocytes Relative: 32 %
Lymphs Abs: 1.3 10*3/uL (ref 0.7–4.0)
MCH: 32.7 pg (ref 26.0–34.0)
MCHC: 34.6 g/dL (ref 30.0–36.0)
MCV: 94.7 fL (ref 80.0–100.0)
Monocytes Absolute: 0.9 10*3/uL (ref 0.1–1.0)
Monocytes Relative: 23 %
Neutro Abs: 1.7 10*3/uL (ref 1.7–7.7)
Neutrophils Relative %: 41 %
Platelet Count: 216 10*3/uL (ref 150–400)
RBC: 3.94 MIL/uL (ref 3.87–5.11)
RDW: 12.7 % (ref 11.5–15.5)
WBC Count: 4 10*3/uL (ref 4.0–10.5)
nRBC: 0 % (ref 0.0–0.2)

## 2022-06-23 LAB — CMP (CANCER CENTER ONLY)
ALT: 23 U/L (ref 0–44)
AST: 19 U/L (ref 15–41)
Albumin: 4.2 g/dL (ref 3.5–5.0)
Alkaline Phosphatase: 95 U/L (ref 38–126)
Anion gap: 6 (ref 5–15)
BUN: 18 mg/dL (ref 8–23)
CO2: 28 mmol/L (ref 22–32)
Calcium: 9.2 mg/dL (ref 8.9–10.3)
Chloride: 102 mmol/L (ref 98–111)
Creatinine: 0.8 mg/dL (ref 0.44–1.00)
GFR, Estimated: >60 mL/min (ref >60)
Glucose, Bld: 170 mg/dL — ABNORMAL HIGH (ref 70–99)
Potassium: 3.7 mmol/L (ref 3.5–5.1)
Sodium: 136 mmol/L (ref 135–145)
Total Bilirubin: 0.3 mg/dL (ref 0.3–1.2)
Total Protein: 7.1 g/dL (ref 6.5–8.1)

## 2022-06-23 MED ORDER — SODIUM CHLORIDE 0.9 % IV SOLN
10.0000 mg | Freq: Once | INTRAVENOUS | Status: AC
  Administered 2022-06-23: 10 mg via INTRAVENOUS
  Filled 2022-06-23: qty 10

## 2022-06-23 MED ORDER — SODIUM CHLORIDE 0.9% FLUSH
10.0000 mL | INTRAVENOUS | Status: DC | PRN
  Administered 2022-06-23: 10 mL

## 2022-06-23 MED ORDER — SODIUM CHLORIDE 0.9 % IV SOLN
681.0000 mg | Freq: Once | INTRAVENOUS | Status: AC
  Administered 2022-06-23: 700 mg via INTRAVENOUS
  Filled 2022-06-23: qty 70

## 2022-06-23 MED ORDER — HEPARIN SOD (PORK) LOCK FLUSH 100 UNIT/ML IV SOLN
500.0000 [IU] | Freq: Once | INTRAVENOUS | Status: AC | PRN
  Administered 2022-06-23: 500 [IU]

## 2022-06-23 MED ORDER — FAMOTIDINE IN NACL 20-0.9 MG/50ML-% IV SOLN
20.0000 mg | Freq: Once | INTRAVENOUS | Status: AC
  Administered 2022-06-23: 20 mg via INTRAVENOUS
  Filled 2022-06-23: qty 50

## 2022-06-23 MED ORDER — SODIUM CHLORIDE 0.9 % IV SOLN
175.0000 mg/m2 | Freq: Once | INTRAVENOUS | Status: AC
  Administered 2022-06-23: 342 mg via INTRAVENOUS
  Filled 2022-06-23: qty 57

## 2022-06-23 MED ORDER — CETIRIZINE HCL 10 MG/ML IV SOLN
10.0000 mg | Freq: Once | INTRAVENOUS | Status: AC
  Administered 2022-06-23: 10 mg via INTRAVENOUS
  Filled 2022-06-23: qty 1

## 2022-06-23 MED ORDER — SODIUM CHLORIDE 0.9 % IV SOLN
150.0000 mg | Freq: Once | INTRAVENOUS | Status: AC
  Administered 2022-06-23: 150 mg via INTRAVENOUS
  Filled 2022-06-23: qty 150

## 2022-06-23 MED ORDER — SODIUM CHLORIDE 0.9 % IV SOLN: Freq: Once | INTRAVENOUS | Status: AC

## 2022-06-23 MED ORDER — PALONOSETRON HCL INJECTION 0.25 MG/5ML
0.2500 mg | Freq: Once | INTRAVENOUS | Status: AC
  Administered 2022-06-23: 0.25 mg via INTRAVENOUS
  Filled 2022-06-23: qty 5

## 2022-06-23 MED ORDER — SODIUM CHLORIDE 0.9% FLUSH
10.0000 mL | INTRAVENOUS | Status: AC | PRN
  Administered 2022-06-23: 10 mL

## 2022-06-23 NOTE — Telephone Encounter (Signed)
Called regarding not arriving to today's appts. She is in the lobby checking in now. She is late due to having a MS episode.

## 2022-06-23 NOTE — Assessment & Plan Note (Signed)
We discussed importance of aggressive laxative therapy

## 2022-06-23 NOTE — Patient Instructions (Signed)
[facility]  Discharge Instructions: Thank you for choosing [facility] to provide your oncology and hematology care.   If you have a lab appointment with the Cancer Center, please go directly to the Cancer Center and check in at the registration area.   Wear comfortable clothing and clothing appropriate for easy access to any Portacath or PICC line.   We strive to give you quality time with your provider. You may need to reschedule your appointment if you arrive late (15 or more minutes).  Arriving late affects you and other patients whose appointments are after yours.  Also, if you miss three or more appointments without notifying the office, you may be dismissed from the clinic at the provider's discretion.      For prescription refill requests, have your pharmacy contact our office and allow 72 hours for refills to be completed.    Today you received the following chemotherapy and/or immunotherapy agents: Paclitaxel and Carboplatin      To help prevent nausea and vomiting after your treatment, we encourage you to take your nausea medication as directed.  BELOW ARE SYMPTOMS THAT SHOULD BE REPORTED IMMEDIATELY: *FEVER GREATER THAN 100.4 F (38 C) OR HIGHER *CHILLS OR SWEATING *NAUSEA AND VOMITING THAT IS NOT CONTROLLED WITH YOUR NAUSEA MEDICATION *UNUSUAL SHORTNESS OF BREATH *UNUSUAL BRUISING OR BLEEDING *URINARY PROBLEMS (pain or burning when urinating, or frequent urination) *BOWEL PROBLEMS (unusual diarrhea, constipation, pain near the anus) TENDERNESS IN MOUTH AND THROAT WITH OR WITHOUT PRESENCE OF ULCERS (sore throat, sores in mouth, or a toothache) UNUSUAL RASH, SWELLING OR PAIN  UNUSUAL VAGINAL DISCHARGE OR ITCHING   Items with * indicate a potential emergency and should be followed up as soon as possible or go to the Emergency Department if any problems should occur.  Please show the CHEMOTHERAPY ALERT CARD or IMMUNOTHERAPY  ALERT CARD at check-in to the Emergency Department and triage nurse.  Should you have questions after your visit or need to cancel or reschedule your appointment, please contact [facility]  Dept: [phone]  and follow the prompts.  Office hours are 8:00 a.m. to 4:30 p.m. Monday - Friday. Please note that voicemails left after 4:00 p.m. may not be returned until the following business day.  We are closed weekends and major holidays. You have access to a nurse at all times for urgent questions. Please call the main number to the clinic Dept: [phone] and follow the prompts.   For any non-urgent questions, you may also contact your provider using MyChart. We now offer e-Visits for anyone 18 and older to request care online for non-urgent symptoms. For details visit [email or web address].   Also download the MyChart app! Go to the app store, search "MyChart", open the app, select [facility], and log in with your MyChart username and password.  Paclitaxel Injection What is this medication? PACLITAXEL (PAK li TAX el) treats some types of cancer. It works by slowing down the growth of cancer cells. This medicine may be used for other purposes; ask your health care provider or pharmacist if you have questions. COMMON BRAND NAME(S): Onxol, Taxol What should I tell my care team before I take this medication? They need to know if you have any of these conditions: Heart disease Liver disease Low white blood cell levels An unusual or allergic reaction to paclitaxel, other medications, foods, dyes, or preservatives If you or your partner are pregnant or trying   to get pregnant Breast-feeding How should I use this medication? This medication is injected into a vein. It is given by your care team in a hospital or clinic setting. Talk to your care team about the use of this medication in children. While it may be given to children for selected conditions,  precautions do apply. Overdosage: If you think you have taken too much of this medicine contact a poison control center or emergency room at once. NOTE: This medicine is only for you. Do not share this medicine with others. What if I miss a dose? Keep appointments for follow-up doses. It is important not to miss your dose. Call your care team if you are unable to keep an appointment. What may interact with this medication? Do not take this medication with any of the following: Live virus vaccines Other medications may affect the way this medication works. Talk with your care team about all of the medications you take. They may suggest changes to your treatment plan to lower the risk of side effects and to make sure your medications work as intended. This list may not describe all possible interactions. Give your health care provider a list of all the medicines, herbs, non-prescription drugs, or dietary supplements you use. Also tell them if you smoke, drink alcohol, or use illegal drugs. Some items may interact with your medicine. What should I watch for while using this medication? Your condition will be monitored carefully while you are receiving this medication. You may need blood work while taking this medication. This medication may make you feel generally unwell. This is not uncommon as chemotherapy can affect healthy cells as well as cancer cells. Report any side effects. Continue your course of treatment even though you feel ill unless your care team tells you to stop. This medication can cause serious allergic reactions. To reduce the risk, your care team may give you other medications to take before receiving this one. Be sure to follow the directions from your care team. This medication may increase your risk of getting an infection. Call your care team for advice if you get a fever, chills, sore throat, or other symptoms of a cold or flu. Do not treat yourself. Try to avoid being around  people who are sick. This medication may increase your risk to bruise or bleed. Call your care team if you notice any unusual bleeding. Be careful brushing or flossing your teeth or using a toothpick because you may get an infection or bleed more easily. If you have any dental work done, tell your dentist you are receiving this medication. Talk to your care team if you may be pregnant. Serious birth defects can occur if you take this medication during pregnancy. Talk to your care team before breastfeeding. Changes to your treatment plan may be needed. What side effects may I notice from receiving this medication? Side effects that you should report to your care team as soon as possible: Allergic reactions--skin rash, itching, hives, swelling of the face, lips, tongue, or throat Heart rhythm changes--fast or irregular heartbeat, dizziness, feeling faint or lightheaded, chest pain, trouble breathing Increase in blood pressure Infection--fever, chills, cough, sore throat, wounds that don't heal, pain or trouble when passing urine, general feeling of discomfort or being unwell Low blood pressure--dizziness, feeling faint or lightheaded, blurry vision Low red blood cell level--unusual weakness or fatigue, dizziness, headache, trouble breathing Painful swelling, warmth, or redness of the skin, blisters or sores at the infusion site Pain, tingling, or   numbness in the hands or feet Slow heartbeat--dizziness, feeling faint or lightheaded, confusion, trouble breathing, unusual weakness or fatigue Unusual bruising or bleeding Side effects that usually do not require medical attention (report to your care team if they continue or are bothersome): Diarrhea Hair loss Joint pain Loss of appetite Muscle pain Nausea Vomiting This list may not describe all possible side effects. Call your doctor for medical advice about side effects. You may report side effects to FDA at 1-800-FDA-1088. Where should I keep  my medication? This medication is given in a hospital or clinic. It will not be stored at home. NOTE: This sheet is a summary. It may not cover all possible information. If you have questions about this medicine, talk to your doctor, pharmacist, or health care provider.  2023 Elsevier/Gold Standard (2021-08-24 00:00:00)  Carboplatin Injection What is this medication? CARBOPLATIN (KAR boe pla tin) treats some types of cancer. It works by slowing down the growth of cancer cells. This medicine may be used for other purposes; ask your health care provider or pharmacist if you have questions. COMMON BRAND NAME(S): Paraplatin What should I tell my care team before I take this medication? They need to know if you have any of these conditions: Blood disorders Hearing problems Kidney disease Recent or ongoing radiation therapy An unusual or allergic reaction to carboplatin, cisplatin, other medications, foods, dyes, or preservatives Pregnant or trying to get pregnant Breast-feeding How should I use this medication? This medication is injected into a vein. It is given by your care team in a hospital or clinic setting. Talk to your care team about the use of this medication in children. Special care may be needed. Overdosage: If you think you have taken too much of this medicine contact a poison control center or emergency room at once. NOTE: This medicine is only for you. Do not share this medicine with others. What if I miss a dose? Keep appointments for follow-up doses. It is important not to miss your dose. Call your care team if you are unable to keep an appointment. What may interact with this medication? Medications for seizures Some antibiotics, such as amikacin, gentamicin, neomycin, streptomycin, tobramycin Vaccines This list may not describe all possible interactions. Give your health care provider a list of all the medicines, herbs, non-prescription drugs, or dietary supplements you  use. Also tell them if you smoke, drink alcohol, or use illegal drugs. Some items may interact with your medicine. What should I watch for while using this medication? Your condition will be monitored carefully while you are receiving this medication. You may need blood work while taking this medication. This medication may make you feel generally unwell. This is not uncommon, as chemotherapy can affect healthy cells as well as cancer cells. Report any side effects. Continue your course of treatment even though you feel ill unless your care team tells you to stop. In some cases, you may be given additional medications to help with side effects. Follow all directions for their use. This medication may increase your risk of getting an infection. Call your care team for advice if you get a fever, chills, sore throat, or other symptoms of a cold or flu. Do not treat yourself. Try to avoid being around people who are sick. Avoid taking medications that contain aspirin, acetaminophen, ibuprofen, naproxen, or ketoprofen unless instructed by your care team. These medications may hide a fever. Be careful brushing or flossing your teeth or using a toothpick because you may get   an infection or bleed more easily. If you have any dental work done, tell your dentist you are receiving this medication. Talk to your care team if you wish to become pregnant or think you might be pregnant. This medication can cause serious birth defects. Talk to your care team about effective forms of contraception. Do not breast-feed while taking this medication. What side effects may I notice from receiving this medication? Side effects that you should report to your care team as soon as possible: Allergic reactions--skin rash, itching, hives, swelling of the face, lips, tongue, or throat Infection--fever, chills, cough, sore throat, wounds that don't heal, pain or trouble when passing urine, general feeling of discomfort or being  unwell Low red blood cell level--unusual weakness or fatigue, dizziness, headache, trouble breathing Pain, tingling, or numbness in the hands or feet, muscle weakness, change in vision, confusion or trouble speaking, loss of balance or coordination, trouble walking, seizures Unusual bruising or bleeding Side effects that usually do not require medical attention (report to your care team if they continue or are bothersome): Hair loss Nausea Unusual weakness or fatigue Vomiting This list may not describe all possible side effects. Call your doctor for medical advice about side effects. You may report side effects to FDA at 1-800-FDA-1088. Where should I keep my medication? This medication is given in a hospital or clinic. It will not be stored at home. NOTE: This sheet is a summary. It may not cover all possible information. If you have questions about this medicine, talk to your doctor, pharmacist, or health care provider.  2023 Elsevier/Gold Standard (2021-08-08 00:00:00)   

## 2022-06-23 NOTE — Progress Notes (Signed)
[facility] OFF[facility] PROGRESS NOTE  Patient Care Team: Shirline Frees, MD as PCP - General (Family Medicine)  ASSESSMENT & PLAN:  Ovarian cancer, bilateral [facility]) Her treatment course was complicated by exacerbation of her chronic pain syndrome which has since resolved Her leg edema and cellulitis has resolved Continue supportive care We will proceed with treatment as scheduled I plan to repeat imaging study after 3 cycles of therapy  Other constipation We discussed importance of aggressive laxative therapy  Chronic use of opiate drug for therapeutic purpose Her medication for pain was recently adjusted and she tolerated treatment better I would defer to her pain management team for treatment  No orders of the defined types were placed in this encounter.   All questions were answered. The patient knows to call the clinic with any problems, questions or concerns. The total time spent in the appointment was 20 minutes encounter with patients including review of chart and various tests results, discussions about plan of care and coordination of care plan   Heath Lark, MD 06/23/2022 11:11 AM  INTERVAL HISTORY: Please see below for problem oriented charting. she returns for treatment follow-up seen prior to cycle 2 of treatment She is doing much better Leg swelling has resolved Abdomen is less distended She denies nausea or constipation She had recent flare of pain and sensitivity on her skull; her pain management team has modify her pain regimen recently  REVIEW OF SYSTEMS:   Constitutional: Denies fevers, chills or abnormal weight loss Eyes: Denies blurriness of vision Ears, nose, mouth, throat, and face: Denies mucositis or sore throat Respiratory: Denies cough, dyspnea or wheezes Cardiovascular: Denies palpitation, chest discomfort or lower extremity swelling Gastrointestinal:  Denies nausea, heartburn or change in bowel habits Skin: Denies abnormal skin  rashes Lymphatics: Denies new lymphadenopathy or easy bruising Behavioral/Psych: Mood is stable, no new changes  All other systems were reviewed with the patient and are negative.  I have reviewed the past medical history, past surgical history, social history and family history with the patient and they are unchanged from previous note.  ALLERGIES:  is allergic to cymbalta [duloxetine hcl], cyclobenzaprine hcl, celexa  [citalopram hydrobromide], fluticasone furoate-vilanterol, gabapentin, opana  [oxymorphone hcl], and tramadol.  MEDICATIONS:  Current Outpatient Medications  Medication Sig Dispense Refill   apixaban (ELIQUIS) 2.5 MG TABS tablet Take 1 tablet (2.5 mg total) by mouth 2 (two) times daily. 60 tablet 11   albuterol (VENTOLIN HFA) 108 (90 Base) MCG/ACT inhaler Inhale 2 puffs into the lungs every 6 (six) hours as needed for shortness of breath.     amphetamine-dextroamphetamine (ADDERALL) 20 MG tablet Take 10-20 mg by mouth See admin instructions. Take 20 mg by mouth in the morning and 10 mg between 1-2 PM daily     Ascorbic Acid (VITAMIN C) 1000 MG tablet Take 1,000 mg by mouth once a week.     ASHWAGANDHA PO Take 2 capsules by mouth every 7 (seven) days.     BLACK CURRANT SEED OIL PO Take 5 mLs by mouth every 7 (seven) days.     cyclobenzaprine (FLEXERIL) 5 MG tablet Take 5-10 mg by mouth 3 (three) times daily as needed for muscle spasms.     dexamethasone (DECADRON) 4 MG tablet Take 2 tabs at the night before and 2 tab the morning of chemotherapy, every 3 weeks, by mouth x 6 cycles (Patient not taking: Reported on 05/30/2022) 24 tablet 6   diclofenac Sodium (VOLTAREN) 1 % GEL Apply 2 g  topically daily as needed (to affected areas- for pain).     hydroxychloroquine (PLAQUENIL) 200 MG tablet Take 200 mg by mouth daily.     irbesartan (AVAPRO) 300 MG tablet Take 300 mg by mouth daily.  3   lidocaine-prilocaine (EMLA) cream Apply to affected area once (Patient not taking: Reported on  05/30/2022) 30 g 3   LORazepam (ATIVAN) 0.5 MG tablet Take 0.5 mg by mouth 3 (three) times daily.     meclizine (ANTIVERT) 12.5 MG tablet Take 12.5 mg by mouth 3 (three) times daily as needed for dizziness.     Melatonin 10 MG TABS Take 10 mg by mouth at bedtime as needed (sleep).     Multiple Vitamin (MULTIVITAMIN WITH MINERALS) TABS tablet Take 1 tablet by mouth daily.     mupirocin ointment (BACTROBAN) 2 % Apply 1 Application topically 3 (three) times daily as needed (boils).     naloxone (NARCAN) nasal spray 4 mg/0.1 mL Place 1 spray into the nose as needed (opioid overdose).     omeprazole (PRILOSEC OTC) 20 MG tablet Take 20 mg by mouth daily as needed (for acid reflux).     ondansetron (ZOFRAN) 8 MG tablet Take 1 tablet (8 mg total) by mouth every 8 (eight) hours as needed for nausea or vomiting. Start on the third day after chemotherapy. (Patient not taking: Reported on 05/30/2022) 30 tablet 1   OVER THE COUNTER MEDICATION Take 2 capsules by mouth daily. Sea moss supplement     OVER THE COUNTER MEDICATION Take 2 capsules by mouth See admin instructions. "Liver Focus" capsules- Take 2 capsules by mouth at bedtime     Oxycodone HCl 20 MG TABS Take 20 mg by mouth See admin instructions. Take 20 mg by mouth four to five times a day as needed for pain     polyethylene glycol (MIRALAX / GLYCOLAX) 17 g packet Take 17 g by mouth daily.     POTASSIUM PO Take 1 tablet by mouth every 7 (seven) days.     prochlorperazine (COMPAZINE) 10 MG tablet Take 1 tablet (10 mg total) by mouth every 6 (six) hours as needed for nausea or vomiting. (Patient not taking: Reported on 05/30/2022) 30 tablet 1   TURMERIC CURCUMIN PO Take 1,000 mg by mouth daily.     VITAMIN A PALMITATE PO Take by mouth.     VITAMIN A-BETA CAROTENE PO Take 2,400 Units by mouth once a week.     Vitamin D, Ergocalciferol, (DRISDOL) 1.25 MG (50000 UNIT) CAPS capsule Take 50,000 Units by mouth every Sunday.     WIXELA INHUB 250-50 MCG/ACT AEPB  Inhale 1 puff into the lungs daily.     XTAMPZA ER 9 MG C12A Take 13.5 mg by mouth 3 (three) times daily.     No current facility-administered medications for this visit.   Facility-Administered Medications Ordered in Other Visits  Medication Dose Route Frequency Provider Last Rate Last Admin   CARBOplatin (PARAPLATIN) 700 mg in sodium chloride 0.9 % 250 mL chemo infusion  700 mg Intravenous Once Alvy Bimler, Tedd Cottrill, MD       heparin lock flush 100 unit/mL  500 Units Intracatheter Once PRN Alvy Bimler, Mayelin Panos, MD       PACLitaxel (TAXOL) 342 mg in sodium chloride 0.9 % 500 mL chemo infusion (> 3m/m2)  175 mg/m2 (Treatment Plan Recorded) Intravenous Once Voncille Simm, MD       sodium chloride flush (NS) 0.9 % injection 10 mL  10 mL Intracatheter PRN GAlvy Bimler  Quanda Pavlicek, MD        SUMMARY OF ONCOLOGIC HISTORY: Oncology History Overview Note  High grade serous   Ovarian cancer, bilateral ([place])  04/11/2022 Imaging   CT of the abdomen and pelvis on 04/11/2022 reveals a left adnexal masslike area measuring 5.9 x 3.6 cm.  Margins of this mass are irregular.  There is small fluid in the cul-de-sac as well as fullness of soft tissue about the right adnexa.  Discrete uterine structure is not identified.  Left upper quadrant nodules beneath the left hemidiaphragm (1 anterior to the spleen and the other to the stomach).  Omental/peritoneal nodularity and multiple perihepatic nodules are noted    04/18/2022 Tumor Marker   Patient's tumor was tested for the following markers: CA-125. Results of the tumor marker test revealed 123.   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 Bulky bilateral ovarian masses and nodules, left-greater-than-right, which are essentially confluent with adjacent pelvic soft tissue nodularity and not significantly changed compared to prior examination dated 04/11/2022. 2. Extensive pelvic peritoneal thickening and nodularity. Multiple small peritoneal nodules  throughout the abdomen and pelvis. Findings are consistent with peritoneal metastatic disease and likewise not significantly changed. 3. Small volume of loculated appearing fluid in the low pelvis. 4. 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 The mid to distal right ureter is obstructed by right ovarian mass and or soft tissue nodularity. 5. Calculus within the most distal left ureterovesicular junction or just within the bladder lumen measuring 0.7 cm, unchanged. No associated left-sided hydronephrosis. 6. Soft tissue attenuation nodule of the body of the right adrenal gland. Notably, this was also soft tissue attenuation on prior noncontrast examination (i.e. not definitively macroscopic fat containing) although unchanged compared to prior examinations dating back to [id number] and almost certainly a benign adenoma. Attention on follow-up. 7. No evidence of metastatic disease in the chest. 8. Mild diffuse bilateral bronchial wall thickening. Background of very fine centrilobular nodularity, most concentrated in the lung apices. Findings are most consistent with smoking-related respiratory bronchiolitis.     05/18/2022 Procedure   Ultrasound-guided core biopsy performed of a 10 mm soft tissue peritoneal mass in the right upper quadrant just superficial to the right lobe of the liver. The procedure was performed under general anesthesia immediately following Port-A-Cath placement.   05/18/2022 Procedure   Placement of single lumen port a cath via right internal jugular vein. The catheter tip lies at the cavo-atrial junction. A power injectable port a cath was placed and is ready for immediate use.     05/18/2022 Pathology Results   A. 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  The findings support the above interpretation.  This case was reviewed with Dr. Vic Ripper who agrees with the above diagnosis.  A p16 stain is pending and will be reported in an addendum.    05/23/2022 Cancer Staging   Staging form: Ovary, Fallopian Tube, and Primary Peritoneal Carcinoma, AJCC 8th Edition - Clinical stage from 05/23/2022: FIGO Stage IIIC (cT3c, cN0, cM0) - Signed by Heath Lark, MD on 05/23/2022 Stage prefix: Initial diagnosis   05/29/2022 Imaging   1. Complex bilateral adnexal masses, as described above, consistent with the patient's known malignancy. 2. Subsequent marked severity mass effect on the distal  bilateral common iliac veins, increased in severity when compared to the prior exam. 3. Additional findings consistent with peritoneal metastasis within the pelvis. 4. Findings consistent with pelvic congestion syndrome. 5. Postoperative changes within the mid and lower lumbar spine. 6. Aortic atherosclerosis. 7. No evidence of venous thrombus within the abdomen, pelvis or proximal bilateral lower extremities.   Aortic Atherosclerosis (ICD10-I70.0).   06/02/2022 -  Chemotherapy   Patient is on Treatment Plan : OVARIAN Carboplatin (AUC 6) + Paclitaxel (175) q21d X 6 Cycles       PHYSICAL EXAMINATION: ECOG PERFORMANCE STATUS: 1 - Symptomatic but completely ambulatory  Vitals:   06/23/22 0905  BP: (!) 158/90  Pulse: (!) 104  Resp: 18  Temp: (!) 97.5 F (36.4 C)  SpO2: 98%   Filed Weights   06/23/22 0905  Weight: 182 lb 1.6 oz (82.6 kg)    GENERAL:alert, no distress and comfortable  NEURO: alert & oriented x 3 with fluent speech, no focal motor/sensory deficits  LABORATORY DATA:  I have reviewed the data as listed    Component Value Date/Time   NA 136 06/23/2022 0835   K 3.7 06/23/2022 0835   CL 102 06/23/2022 0835   CO2 28 06/23/2022 0835   GLUCOSE 170 (H) 06/23/2022 0835   BUN 18 06/23/2022 0835   CREATININE  0.80 06/23/2022 0835   CALCIUM 9.2 06/23/2022 0835   PROT 7.1 06/23/2022 0835   ALBUMIN 4.2 06/23/2022 0835   AST 19 06/23/2022 0835   ALT 23 06/23/2022 0835   ALKPHOS 95 06/23/2022 0835   BILITOT 0.3 06/23/2022 0835   GFRNONAA >60 06/23/2022 0835   GFRAA >60 08/20/2019 1408    No results found for: "SPEP", "UPEP"  Lab Results  Component Value Date   WBC 4.0 06/23/2022   NEUTROABS 1.7 06/23/2022   HGB 12.9 06/23/2022   HCT 37.3 06/23/2022   MCV 94.7 06/23/2022   PLT 216 06/23/2022      Chemistry      Component Value Date/Time   NA 136 06/23/2022 0835   K 3.7 06/23/2022 0835   CL 102 06/23/2022 0835   CO2 28 06/23/2022 0835   BUN 18 06/23/2022 0835   CREATININE 0.80 06/23/2022 0835      Component Value Date/Time   CALCIUM 9.2 06/23/2022 0835   ALKPHOS 95 06/23/2022 0835   AST 19 06/23/2022 0835   ALT 23 06/23/2022 0835   BILITOT 0.3 06/23/2022 0835

## 2022-06-23 NOTE — Assessment & Plan Note (Signed)
Her treatment course was complicated by exacerbation of her chronic pain syndrome which has since resolved Her leg edema and cellulitis has resolved Continue supportive care We will proceed with treatment as scheduled I plan to repeat imaging study after 3 cycles of therapy

## 2022-06-23 NOTE — Assessment & Plan Note (Signed)
Her medication for pain was recently adjusted and she tolerated treatment better I would defer to her pain management team for treatment

## 2022-06-25 ENCOUNTER — Other Ambulatory Visit: Payer: Self-pay

## 2022-06-25 LAB — CA 125: Cancer Antigen (CA) 125: 134 U/mL — ABNORMAL HIGH (ref 0.0–38.1)

## 2022-06-26 ENCOUNTER — Other Ambulatory Visit: Payer: Self-pay

## 2022-06-29 ENCOUNTER — Telehealth: Payer: Self-pay

## 2022-06-29 NOTE — Telephone Encounter (Signed)
Returned her call. She has several questions.  1.She saw that the CA-125 went up to 134 from 123. and is concerned.  2. She has a trip planned in June and trying to figure out if she can go. She has already paid for the trip. She is asking when you are planning to do the next CT scan?  3. She has had a sore throat for few days. Denies fever and respiratory symptoms. Denies any mouth sores. She is gargling with warm salt water as needed. Asking for recommendations.  4. She is complaining for rectal soreness/ burning for about 3 days. Denies constipation and diarrhea. She is using baby wipes and being careful wiping. Instructed to try sitz bath and the office will follow up with her tomorrow. She verbalized understanding.

## 2022-06-30 ENCOUNTER — Other Ambulatory Visit: Payer: Self-pay

## 2022-06-30 ENCOUNTER — Other Ambulatory Visit (HOSPITAL_COMMUNITY): Payer: Self-pay

## 2022-06-30 ENCOUNTER — Encounter: Payer: Self-pay | Admitting: Hematology and Oncology

## 2022-06-30 MED ORDER — MAGIC MOUTHWASH W/LIDOCAINE: 5.0000 mL | Freq: Four times a day (QID) | ORAL | 0 refills | Status: DC

## 2022-06-30 MED ORDER — NYSTATIN 100000 UNIT/ML MT SUSP
OROMUCOSAL | 0 refills | Status: DC
  Filled 2022-06-30: qty 160, 8d supply, fill #0

## 2022-06-30 NOTE — Telephone Encounter (Signed)
Called and given below message from Dr. Alvy Bimler. She verbalized understanding and would like the MMW Rx due to having issues swallowing. Rx called into [facility] pharmacy.Marland Kitchen

## 2022-06-30 NOTE — Telephone Encounter (Signed)
Her original CA-125 was drawn in Dec but she did not start chemo until Jan. That would explain the slight difference She gets CT after 3rd cycle. I cannot predict that far what would happen until I see the CT results.  Agree with salt water gargle. If problem with swallowing we can call in MMW with lidocaine swish and swallow Agree sitz bath

## 2022-07-04 ENCOUNTER — Other Ambulatory Visit: Payer: Self-pay

## 2022-07-04 DIAGNOSIS — R7309 Other abnormal glucose: Secondary | ICD-10-CM | POA: Diagnosis not present

## 2022-07-04 DIAGNOSIS — H524 Presbyopia: Secondary | ICD-10-CM | POA: Diagnosis not present

## 2022-07-04 DIAGNOSIS — H04123 Dry eye syndrome of bilateral lacrimal glands: Secondary | ICD-10-CM | POA: Diagnosis not present

## 2022-07-04 DIAGNOSIS — H2513 Age-related nuclear cataract, bilateral: Secondary | ICD-10-CM | POA: Diagnosis not present

## 2022-07-04 DIAGNOSIS — H35033 Hypertensive retinopathy, bilateral: Secondary | ICD-10-CM | POA: Diagnosis not present

## 2022-07-06 ENCOUNTER — Other Ambulatory Visit: Payer: Self-pay

## 2022-07-14 MED FILL — Fosaprepitant Dimeglumine For IV Infusion 150 MG (Base Eq): INTRAVENOUS | Qty: 5 | Status: AC

## 2022-07-14 MED FILL — Dexamethasone Sodium Phosphate Inj 100 MG/10ML: INTRAMUSCULAR | Qty: 1 | Status: AC

## 2022-07-17 ENCOUNTER — Inpatient Hospital Stay: Payer: 59

## 2022-07-17 ENCOUNTER — Inpatient Hospital Stay: Payer: 59 | Attending: Gynecologic Oncology | Admitting: Hematology and Oncology

## 2022-07-17 ENCOUNTER — Encounter: Payer: Self-pay | Admitting: Hematology and Oncology

## 2022-07-17 ENCOUNTER — Encounter: Payer: Self-pay | Admitting: Oncology

## 2022-07-17 VITALS — BP 170/96 | HR 85 | Temp 97.8°F | Resp 18 | Wt 179.5 lb

## 2022-07-17 DIAGNOSIS — G620 Drug-induced polyneuropathy: Secondary | ICD-10-CM | POA: Diagnosis not present

## 2022-07-17 DIAGNOSIS — R971 Elevated cancer antigen 125 [CA 125]: Secondary | ICD-10-CM | POA: Insufficient documentation

## 2022-07-17 DIAGNOSIS — T451X5A Adverse effect of antineoplastic and immunosuppressive drugs, initial encounter: Secondary | ICD-10-CM | POA: Insufficient documentation

## 2022-07-17 DIAGNOSIS — G8929 Other chronic pain: Secondary | ICD-10-CM | POA: Insufficient documentation

## 2022-07-17 DIAGNOSIS — Z5111 Encounter for antineoplastic chemotherapy: Secondary | ICD-10-CM | POA: Insufficient documentation

## 2022-07-17 DIAGNOSIS — C563 Malignant neoplasm of bilateral ovaries: Secondary | ICD-10-CM | POA: Insufficient documentation

## 2022-07-17 DIAGNOSIS — Z79891 Long term (current) use of opiate analgesic: Secondary | ICD-10-CM | POA: Diagnosis not present

## 2022-07-17 DIAGNOSIS — D701 Agranulocytosis secondary to cancer chemotherapy: Secondary | ICD-10-CM | POA: Diagnosis not present

## 2022-07-17 DIAGNOSIS — G893 Neoplasm related pain (acute) (chronic): Secondary | ICD-10-CM | POA: Insufficient documentation

## 2022-07-17 DIAGNOSIS — C786 Secondary malignant neoplasm of retroperitoneum and peritoneum: Secondary | ICD-10-CM | POA: Insufficient documentation

## 2022-07-17 DIAGNOSIS — M069 Rheumatoid arthritis, unspecified: Secondary | ICD-10-CM | POA: Diagnosis not present

## 2022-07-17 LAB — CMP (CANCER CENTER ONLY)
ALT: 20 U/L (ref 0–44)
AST: 16 U/L (ref 15–41)
Albumin: 4.5 g/dL (ref 3.5–5.0)
Alkaline Phosphatase: 87 U/L (ref 38–126)
Anion gap: 8 (ref 5–15)
BUN: 19 mg/dL (ref 8–23)
CO2: 25 mmol/L (ref 22–32)
Calcium: 9.5 mg/dL (ref 8.9–10.3)
Chloride: 103 mmol/L (ref 98–111)
Creatinine: 0.82 mg/dL (ref 0.44–1.00)
GFR, Estimated: >60 mL/min (ref >60)
Glucose, Bld: 162 mg/dL — ABNORMAL HIGH (ref 70–99)
Potassium: 3.5 mmol/L (ref 3.5–5.1)
Sodium: 136 mmol/L (ref 135–145)
Total Bilirubin: 0.4 mg/dL (ref 0.3–1.2)
Total Protein: 7.7 g/dL (ref 6.5–8.1)

## 2022-07-17 LAB — CBC WITH DIFFERENTIAL (CANCER CENTER ONLY)
Abs Immature Granulocytes: 0 10*3/uL (ref 0.00–0.07)
Basophils Absolute: 0 10*3/uL (ref 0.0–0.1)
Basophils Relative: 1 %
Eosinophils Absolute: 0 10*3/uL (ref 0.0–0.5)
Eosinophils Relative: 0 %
HCT: 38.4 % (ref 36.0–46.0)
Hemoglobin: 13.6 g/dL (ref 12.0–15.0)
Immature Granulocytes: 0 %
Lymphocytes Relative: 34 %
Lymphs Abs: 0.6 10*3/uL — ABNORMAL LOW (ref 0.7–4.0)
MCH: 33 pg (ref 26.0–34.0)
MCHC: 35.4 g/dL (ref 30.0–36.0)
MCV: 93.2 fL (ref 80.0–100.0)
Monocytes Absolute: 0.1 10*3/uL (ref 0.1–1.0)
Monocytes Relative: 4 %
Neutro Abs: 1.1 10*3/uL — ABNORMAL LOW (ref 1.7–7.7)
Neutrophils Relative %: 61 %
Platelet Count: 298 10*3/uL (ref 150–400)
RBC: 4.12 MIL/uL (ref 3.87–5.11)
RDW: 13.9 % (ref 11.5–15.5)
WBC Count: 1.7 10*3/uL — ABNORMAL LOW (ref 4.0–10.5)
nRBC: 0 % (ref 0.0–0.2)

## 2022-07-17 MED ORDER — SODIUM CHLORIDE 0.9 % IV SOLN
667.8000 mg | Freq: Once | INTRAVENOUS | Status: AC
  Administered 2022-07-17: 670 mg via INTRAVENOUS
  Filled 2022-07-17: qty 67

## 2022-07-17 MED ORDER — SODIUM CHLORIDE 0.9 % IV SOLN
131.2500 mg/m2 | Freq: Once | INTRAVENOUS | Status: AC
  Administered 2022-07-17: 258 mg via INTRAVENOUS
  Filled 2022-07-17: qty 43

## 2022-07-17 MED ORDER — SODIUM CHLORIDE 0.9 % IV SOLN: Freq: Once | INTRAVENOUS | Status: AC

## 2022-07-17 MED ORDER — CETIRIZINE HCL 10 MG/ML IV SOLN
10.0000 mg | Freq: Once | INTRAVENOUS | Status: AC
  Administered 2022-07-17: 10 mg via INTRAVENOUS
  Filled 2022-07-17: qty 1

## 2022-07-17 MED ORDER — FAMOTIDINE IN NACL 20-0.9 MG/50ML-% IV SOLN
20.0000 mg | Freq: Once | INTRAVENOUS | Status: AC
  Administered 2022-07-17: 20 mg via INTRAVENOUS
  Filled 2022-07-17: qty 50

## 2022-07-17 MED ORDER — SODIUM CHLORIDE 0.9 % IV SOLN
150.0000 mg | Freq: Once | INTRAVENOUS | Status: AC
  Administered 2022-07-17: 150 mg via INTRAVENOUS
  Filled 2022-07-17: qty 150

## 2022-07-17 MED ORDER — HEPARIN SOD (PORK) LOCK FLUSH 100 UNIT/ML IV SOLN: 500.0000 [IU] | Freq: Once | INTRAVENOUS | Status: DC | PRN

## 2022-07-17 MED ORDER — SODIUM CHLORIDE 0.9% FLUSH
10.0000 mL | Freq: Once | INTRAVENOUS | Status: AC
  Administered 2022-07-17: 10 mL

## 2022-07-17 MED ORDER — PALONOSETRON HCL INJECTION 0.25 MG/5ML
0.2500 mg | Freq: Once | INTRAVENOUS | Status: AC
  Administered 2022-07-17: 0.25 mg via INTRAVENOUS
  Filled 2022-07-17: qty 5

## 2022-07-17 MED ORDER — SODIUM CHLORIDE 0.9 % IV SOLN
10.0000 mg | Freq: Once | INTRAVENOUS | Status: AC
  Administered 2022-07-17: 10 mg via INTRAVENOUS
  Filled 2022-07-17: qty 10

## 2022-07-17 MED ORDER — SODIUM CHLORIDE 0.9% FLUSH: 10.0000 mL | INTRAVENOUS | Status: DC | PRN

## 2022-07-17 NOTE — Assessment & Plan Note (Signed)
Clinically, she is responding to treatment even though her tumor marker was not much changed However, she is developing neutropenia and slight worsening peripheral neuropathy We discussed the risk and benefits of continuing treatment today and she is in agreement I plan to reduce the dose of paclitaxel I plan to order imaging study before her next cycle of chemotherapy

## 2022-07-17 NOTE — Progress Notes (Signed)
[place] OFFICE PROGRESS NOTE  Patient Care Team: Shirline Frees, MD as PCP - General (Family Medicine)  ASSESSMENT & PLAN:  Ovarian cancer, bilateral ([place]) Clinically, she is responding to treatment even though her tumor marker was not much changed However, she is developing neutropenia and slight worsening peripheral neuropathy We discussed the risk and benefits of continuing treatment today and she is in agreement I plan to reduce the dose of paclitaxel I plan to order imaging study before her next cycle of chemotherapy  Peripheral neuropathy due to chemotherapy [facility]) she has mild peripheral neuropathy, likely related to side effects of treatment. I plan to reduce the dose of treatment as outlined above.  I explained to the patient the rationale of this strategy and reassured the patient it would not compromise the efficacy of treatment   Leukopenia due to antineoplastic chemotherapy [facility]) This is likely due to recent treatment. The patient denies recent history of fevers, cough, chills, diarrhea or dysuria. She is asymptomatic from the leukopenia. I will observe for now.    Chronic use of opiate drug for therapeutic purpose She noted slight worsening bone pain with recent treatment I am hopeful with mild dose reduction, her pain is manageable  Orders Placed This Encounter  Procedures   CT ABDOMEN PELVIS W CONTRAST    Standing Status:   Future    Standing Expiration Date:   07/17/2023    Order Specific Question:   If indicated for the ordered procedure, I authorize the administration of contrast media per Radiology protocol    Answer:   Yes    Order Specific Question:   Preferred imaging location?    Answer:   [facility]    Order Specific Question:   Radiology Contrast Protocol - do NOT remove file path    Answer:   [email or web address]    All questions were answered. The patient knows to call the clinic with  any problems, questions or concerns. The total time spent in the appointment was 40 minutes encounter with patients including review of chart and various tests results, discussions about plan of care and coordination of care plan   Heath Lark, MD 07/17/2022 11:04 AM  INTERVAL HISTORY: Please see below for problem oriented charting. she returns for treatment follow-up seen prior to cycle 3 of treatment She noticed some mild worsening neuropathy and bone pain Denies recent fever or chills No recent bleeding complications from anticoagulation therapy We discussed test results and next plan of care  REVIEW OF SYSTEMS:   Constitutional: Denies fevers, chills or abnormal weight loss Eyes: Denies blurriness of vision Ears, nose, mouth, throat, and face: Denies mucositis or sore throat Respiratory: Denies cough, dyspnea or wheezes Cardiovascular: Denies palpitation, chest discomfort or lower extremity swelling Gastrointestinal:  Denies nausea, heartburn or change in bowel habits Skin: Denies abnormal skin rashes Lymphatics: Denies new lymphadenopathy or easy bruising Behavioral/Psych: Mood is stable, no new changes  All other systems were reviewed with the patient and are negative.  I have reviewed the past medical history, past surgical history, social history and family history with the patient and they are unchanged from previous note.  ALLERGIES:  is allergic to cymbalta [duloxetine hcl], cyclobenzaprine hcl, celexa  [citalopram hydrobromide], fluticasone furoate-vilanterol, gabapentin, opana  [oxymorphone hcl], and tramadol.  MEDICATIONS:  Current Outpatient Medications  Medication Sig Dispense Refill   apixaban (ELIQUIS) 2.5 MG TABS tablet Take 1 tablet (2.5 mg total) by mouth 2 (two) times daily. 60 tablet 11  albuterol (VENTOLIN HFA) 108 (90 Base) MCG/ACT inhaler Inhale 2 puffs into the lungs every 6 (six) hours as needed for shortness of breath.     amphetamine-dextroamphetamine  (ADDERALL) 20 MG tablet Take 10-20 mg by mouth See admin instructions. Take 20 mg by mouth in the morning and 10 mg between 1-2 PM daily     Ascorbic Acid (VITAMIN C) 1000 MG tablet Take 1,000 mg by mouth once a week.     ASHWAGANDHA PO Take 2 capsules by mouth every 7 (seven) days.     BLACK CURRANT SEED OIL PO Take 5 mLs by mouth every 7 (seven) days.     cyclobenzaprine (FLEXERIL) 5 MG tablet Take 5-10 mg by mouth 3 (three) times daily as needed for muscle spasms.     dexamethasone (DECADRON) 4 MG tablet Take 2 tabs at the night before and 2 tab the morning of chemotherapy, every 3 weeks, by mouth x 6 cycles (Patient not taking: Reported on 05/30/2022) 24 tablet 6   diclofenac Sodium (VOLTAREN) 1 % GEL Apply 2 g topically daily as needed (to affected areas- for pain).     hydroxychloroquine (PLAQUENIL) 200 MG tablet Take 200 mg by mouth daily.     irbesartan (AVAPRO) 300 MG tablet Take 300 mg by mouth daily.  3   lidocaine-prilocaine (EMLA) cream Apply to affected area once (Patient not taking: Reported on 05/30/2022) 30 g 3   LORazepam (ATIVAN) 0.5 MG tablet Take 0.5 mg by mouth 3 (three) times daily.     magic mouthwash (nystatin, lidocaine, diphenhydrAMINE, alum & mag hydroxide) suspension Swish and swallow 5 mls by mouth 4 times a day as needed for 7 days 160 mL 0   magic mouthwash w/lidocaine SOLN Take 5 mLs by mouth 4 (four) times daily. Take 5 mls 4 x day for 7 days, swish and swallow. 160 mL 0   meclizine (ANTIVERT) 12.5 MG tablet Take 12.5 mg by mouth 3 (three) times daily as needed for dizziness.     Melatonin 10 MG TABS Take 10 mg by mouth at bedtime as needed (sleep).     Multiple Vitamin (MULTIVITAMIN WITH MINERALS) TABS tablet Take 1 tablet by mouth daily.     mupirocin ointment (BACTROBAN) 2 % Apply 1 Application topically 3 (three) times daily as needed (boils).     naloxone (NARCAN) nasal spray 4 mg/0.1 mL Place 1 spray into the nose as needed (opioid overdose).     omeprazole  (PRILOSEC OTC) 20 MG tablet Take 20 mg by mouth daily as needed (for acid reflux).     ondansetron (ZOFRAN) 8 MG tablet Take 1 tablet (8 mg total) by mouth every 8 (eight) hours as needed for nausea or vomiting. Start on the third day after chemotherapy. (Patient not taking: Reported on 05/30/2022) 30 tablet 1   OVER THE COUNTER MEDICATION Take 2 capsules by mouth daily. Sea moss supplement     OVER THE COUNTER MEDICATION Take 2 capsules by mouth See admin instructions. "Liver Focus" capsules- Take 2 capsules by mouth at bedtime     Oxycodone HCl 20 MG TABS Take 20 mg by mouth See admin instructions. Take 20 mg by mouth four to five times a day as needed for pain     polyethylene glycol (MIRALAX / GLYCOLAX) 17 g packet Take 17 g by mouth daily.     POTASSIUM PO Take 1 tablet by mouth every 7 (seven) days.     prochlorperazine (COMPAZINE) 10 MG tablet Take 1  tablet (10 mg total) by mouth every 6 (six) hours as needed for nausea or vomiting. (Patient not taking: Reported on 05/30/2022) 30 tablet 1   TURMERIC CURCUMIN PO Take 1,000 mg by mouth daily.     VITAMIN A PALMITATE PO Take by mouth.     VITAMIN A-BETA CAROTENE PO Take 2,400 Units by mouth once a week.     Vitamin D, Ergocalciferol, (DRISDOL) 1.25 MG (50000 UNIT) CAPS capsule Take 50,000 Units by mouth every Sunday.     WIXELA INHUB 250-50 MCG/ACT AEPB Inhale 1 puff into the lungs daily.     XTAMPZA ER 9 MG C12A Take 13.5 mg by mouth 3 (three) times daily.     No current facility-administered medications for this visit.   Facility-Administered Medications Ordered in Other Visits  Medication Dose Route Frequency Provider Last Rate Last Admin   CARBOplatin (PARAPLATIN) 670 mg in sodium chloride 0.9 % 250 mL chemo infusion  670 mg Intravenous Once Alvy Bimler, Bradlee Bridgers, MD       cetirizine (QUZYTTIR) injection 10 mg  10 mg Intravenous Once Alvy Bimler, Joban Colledge, MD       dexamethasone (DECADRON) 10 mg in sodium chloride 0.9 % 50 mL IVPB  10 mg Intravenous Once  Alvy Bimler, Avice Funchess, MD       famotidine (PEPCID) IVPB 20 mg premix  20 mg Intravenous Once Alvy Bimler, Kristian Hazzard, MD       fosaprepitant (EMEND) 150 mg in sodium chloride 0.9 % 145 mL IVPB  150 mg Intravenous Once Alvy Bimler, Matilda Fleig, MD       heparin lock flush 100 unit/mL  500 Units Intracatheter Once PRN Alvy Bimler, Kanyla Omeara, MD       PACLitaxel (TAXOL) 258 mg in sodium chloride 0.9 % 250 mL chemo infusion (> '80mg'$ /m2)  131.25 mg/m2 (Treatment Plan Recorded) Intravenous Once Alvy Bimler, Satrina Magallanes, MD       palonosetron (ALOXI) injection 0.25 mg  0.25 mg Intravenous Once Hollye Pritt, MD       sodium chloride flush (NS) 0.9 % injection 10 mL  10 mL Intracatheter PRN Alvy Bimler, Elija Mccamish, MD        SUMMARY OF ONCOLOGIC HISTORY: Oncology History Overview Note  High grade serous   Ovarian cancer, bilateral ([place])  04/11/2022 Imaging   CT of the abdomen and pelvis on 04/11/2022 reveals a left adnexal masslike area measuring 5.9 x 3.6 cm.  Margins of this mass are irregular.  There is small fluid in the cul-de-sac as well as fullness of soft tissue about the right adnexa.  Discrete uterine structure is not identified.  Left upper quadrant nodules beneath the left hemidiaphragm (1 anterior to the spleen and the other to the stomach).  Omental/peritoneal nodularity and multiple perihepatic nodules are noted    04/18/2022 Tumor Marker   Patient's tumor was tested for the following markers: CA-125. Results of the tumor marker test revealed 123.   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 Bulky bilateral ovarian masses and nodules, left-greater-than-right, which are essentially confluent with adjacent pelvic soft tissue nodularity and not significantly changed compared to prior examination dated 04/11/2022. 2. Extensive pelvic peritoneal thickening and nodularity. Multiple small peritoneal nodules throughout the abdomen and pelvis. Findings are consistent with peritoneal metastatic disease and likewise not significantly  changed. 3. Small volume of loculated appearing fluid in the low pelvis. 4. 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 The mid to distal right  ureter is obstructed by right ovarian mass and or soft tissue nodularity. 5. Calculus within the most distal left ureterovesicular junction or just within the bladder lumen measuring 0.7 cm, unchanged. No associated left-sided hydronephrosis. 6. Soft tissue attenuation nodule of the body of the right adrenal gland. Notably, this was also soft tissue attenuation on prior noncontrast examination (i.e. not definitively macroscopic fat containing) although unchanged compared to prior examinations dating back to 99991111 and almost certainly a benign adenoma. Attention on follow-up. 7. No evidence of metastatic disease in the chest. 8. Mild diffuse bilateral bronchial wall thickening. Background of very fine centrilobular nodularity, most concentrated in the lung apices. Findings are most consistent with smoking-related respiratory bronchiolitis.     05/18/2022 Procedure   Ultrasound-guided core biopsy performed of a 10 mm soft tissue peritoneal mass in the right upper quadrant just superficial to the right lobe of the liver. The procedure was performed under general anesthesia immediately following Port-A-Cath placement.   05/18/2022 Procedure   Placement of single lumen port a cath via right internal jugular vein. The catheter tip lies at the cavo-atrial junction. A power injectable port a cath was placed and is ready for immediate use.     05/18/2022 Pathology Results   A. 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  The findings support the above interpretation.  This case was reviewed with Dr.  Vic Ripper who agrees with the above diagnosis.  A p16 stain is pending and will be reported in an addendum.    05/23/2022 Cancer Staging   Staging form: Ovary, Fallopian Tube, and Primary Peritoneal Carcinoma, AJCC 8th Edition - Clinical stage from 05/23/2022: FIGO Stage IIIC (cT3c, cN0, cM0) - Signed by Heath Lark, MD on 05/23/2022 Stage prefix: Initial diagnosis   05/29/2022 Imaging   1. Complex bilateral adnexal masses, as described above, consistent with the patient's known malignancy. 2. Subsequent marked severity mass effect on the distal bilateral common iliac veins, increased in severity when compared to the prior exam. 3. Additional findings consistent with peritoneal metastasis within the pelvis. 4. Findings consistent with pelvic congestion syndrome. 5. Postoperative changes within the mid and lower lumbar spine. 6. Aortic atherosclerosis. 7. No evidence of venous thrombus within the abdomen, pelvis or proximal bilateral lower extremities.   Aortic Atherosclerosis (ICD10-I70.0).   06/02/2022 -  Chemotherapy   Patient is on Treatment Plan : OVARIAN Carboplatin (AUC 6) + Paclitaxel (175) q21d X 6 Cycles     06/26/2022 Tumor Marker   Patient's tumor was tested for the following markers: CA-125. Results of the tumor marker test revealed 134.     PHYSICAL EXAMINATION: ECOG PERFORMANCE STATUS: 1 - Symptomatic but completely ambulatory GENERAL:alert, no distress and comfortable NEURO: alert & oriented x 3 with fluent speech  LABORATORY DATA:  I have reviewed the data as listed    Component Value Date/Time   NA 136 07/17/2022 0936   K 3.5 07/17/2022 0936   CL 103 07/17/2022 0936   CO2 25 07/17/2022 0936   GLUCOSE 162 (H) 07/17/2022 0936   BUN 19 07/17/2022 0936   CREATININE 0.82 07/17/2022 0936   CALCIUM 9.5 07/17/2022 0936   PROT 7.7 07/17/2022 0936   ALBUMIN 4.5 07/17/2022 0936   AST 16 07/17/2022 0936   ALT 20 07/17/2022 0936   ALKPHOS 87 07/17/2022 0936   BILITOT 0.4  07/17/2022 0936  GFRNONAA >60 07/17/2022 0936   GFRAA >60 08/20/2019 1408    No results found for: "SPEP", "UPEP"  Lab Results  Component Value Date   WBC 1.7 (L) 07/17/2022   NEUTROABS 1.1 (L) 07/17/2022   HGB 13.6 07/17/2022   HCT 38.4 07/17/2022   MCV 93.2 07/17/2022   PLT 298 07/17/2022      Chemistry      Component Value Date/Time   NA 136 07/17/2022 0936   K 3.5 07/17/2022 0936   CL 103 07/17/2022 0936   CO2 25 07/17/2022 0936   BUN 19 07/17/2022 0936   CREATININE 0.82 07/17/2022 0936      Component Value Date/Time   CALCIUM 9.5 07/17/2022 0936   ALKPHOS 87 07/17/2022 0936   AST 16 07/17/2022 0936   ALT 20 07/17/2022 0936   BILITOT 0.4 07/17/2022 0936

## 2022-07-17 NOTE — Progress Notes (Signed)
Met with [name] in the infusion room and discussed that Dr. Alvy Bimler would like her to schedule a CT scan for 08/02/22.  Gave her the radiology scheduling information sheet. She verbalized understanding and agreement and will call to schedule the scan.

## 2022-07-17 NOTE — Assessment & Plan Note (Signed)
This is likely due to recent treatment. The patient denies recent history of fevers, cough, chills, diarrhea or dysuria. She is asymptomatic from the leukopenia. I will observe for now.    

## 2022-07-17 NOTE — Assessment & Plan Note (Signed)
She noted slight worsening bone pain with recent treatment I am hopeful with mild dose reduction, her pain is manageable

## 2022-07-17 NOTE — Assessment & Plan Note (Signed)
she has mild peripheral neuropathy, likely related to side effects of treatment. °I plan to reduce the dose of treatment as outlined above.  °I explained to the patient the rationale of this strategy and reassured the patient it would not compromise the efficacy of treatment ° °

## 2022-07-21 ENCOUNTER — Telehealth: Payer: Self-pay | Admitting: Oncology

## 2022-07-21 NOTE — Telephone Encounter (Signed)
Called [facility] and scheduled a follow up appointment with Dr. Berline Lopes on 08/04/22 at 8:30 to discuss surgery.  She verbalized understanding and agreement.

## 2022-07-24 ENCOUNTER — Telehealth: Payer: Self-pay

## 2022-07-24 NOTE — Telephone Encounter (Signed)
Returned her call. She requested the office add Etta Grandchild, PA with [facility] at [place]. Added to care team. Faxed most recent office note from Dr. Alvy Bimler per her request to [phone]. Received fax confirmation.

## 2022-07-31 ENCOUNTER — Telehealth: Payer: Self-pay | Admitting: *Deleted

## 2022-07-31 NOTE — Telephone Encounter (Signed)
Patient reports pain in back around kidneys and in abdomen like she had before is coming back.  She has CT on 08/02/22.  She stated she wanted Drs Berline Lopes and Alvy Bimler to be aware of it before she has appointments with them and asked that message be sent to them. Appt Dr/ Berline Lopes 3/29; Appt Dr. Alvy Bimler 4/2  Message routed to MDs

## 2022-08-02 ENCOUNTER — Ambulatory Visit (HOSPITAL_COMMUNITY)
Admission: RE | Admit: 2022-08-02 | Discharge: 2022-08-02 | Disposition: A | Discharge status: Home / Self Care | Payer: 59 | Source: Ambulatory Visit | Attending: Hematology and Oncology | Admitting: Hematology and Oncology

## 2022-08-02 DIAGNOSIS — C563 Malignant neoplasm of bilateral ovaries: Secondary | ICD-10-CM | POA: Diagnosis present

## 2022-08-02 DIAGNOSIS — N132 Hydronephrosis with renal and ureteral calculous obstruction: Secondary | ICD-10-CM | POA: Diagnosis not present

## 2022-08-02 MED ORDER — IOHEXOL 9 MG/ML PO SOLN
1000.0000 mL | ORAL | Status: AC
  Administered 2022-08-02: 1000 mL via ORAL

## 2022-08-02 MED ORDER — IOHEXOL 300 MG/ML  SOLN
100.0000 mL | Freq: Once | INTRAMUSCULAR | Status: AC | PRN
  Administered 2022-08-02: 100 mL via INTRAVENOUS

## 2022-08-03 ENCOUNTER — Telehealth: Payer: Self-pay | Admitting: Oncology

## 2022-08-03 NOTE — Progress Notes (Signed)
Gynecologic Oncology Return Clinic Visit  08/04/22  Reason for Visit: treatment planning  Treatment History: Oncology History Overview Note  High grade serous   Ovarian cancer, bilateral ([place])  04/11/2022 Imaging   CT of the abdomen and pelvis on 04/11/2022 reveals a left adnexal masslike area measuring 5.9 x 3.6 cm.  Margins of this mass are irregular.  There is small fluid in the cul-de-sac as well as fullness of soft tissue about the right adnexa.  Discrete uterine structure is not identified.  Left upper quadrant nodules beneath the left hemidiaphragm (1 anterior to the spleen and the other to the stomach).  Omental/peritoneal nodularity and multiple perihepatic nodules are noted    04/18/2022 Tumor Marker   Patient's tumor was tested for the following markers: CA-125. Results of the tumor marker test revealed 123.   04/19/2022 Initial Diagnosis   Carcinomatosis ([place])   04/28/2022 Imaging   1. Bulky bilateral ovarian masses and nodules, left-greater-than-right, which are essentially confluent with adjacent pelvic soft tissue nodularity and not significantly changed compared to prior examination dated 04/11/2022. 2. Extensive pelvic peritoneal thickening and nodularity. Multiple small peritoneal nodules throughout the abdomen and pelvis. Findings are consistent with peritoneal metastatic disease and likewise not significantly changed. 3. Small volume of loculated appearing fluid in the low pelvis. 4. 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 The mid to distal right ureter is obstructed by right ovarian mass and or soft tissue nodularity. 5. Calculus within the most distal left ureterovesicular junction or just within the bladder lumen measuring 0.7 cm, unchanged. No associated left-sided hydronephrosis. 6. Soft tissue attenuation nodule of the body of the right adrenal gland. Notably,  this was also soft tissue attenuation on prior noncontrast examination (i.e. not definitively macroscopic fat containing) although unchanged compared to prior examinations dating back to [id number] and almost certainly a benign adenoma. Attention on follow-up. 7. No evidence of metastatic disease in the chest. 8. Mild diffuse bilateral bronchial wall thickening. Background of very fine centrilobular nodularity, most concentrated in the lung apices. Findings are most consistent with smoking-related respiratory bronchiolitis.     05/18/2022 Procedure   Ultrasound-guided core biopsy performed of a 10 mm soft tissue peritoneal mass in the right upper quadrant just superficial to the right lobe of the liver. The procedure was performed under general anesthesia immediately following Port-A-Cath placement.   05/18/2022 Procedure   Placement of single lumen port a cath via right internal jugular vein. The catheter tip lies at the cavo-atrial junction. A power injectable port a cath was placed and is ready for immediate use.     05/18/2022 Pathology Results   A. 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  The findings support the above interpretation.  This case was reviewed with Dr. Vic Ripper who agrees with the above diagnosis.  A p16 stain is pending and will be reported in an addendum.    05/23/2022 Cancer Staging   Staging form: Ovary, Fallopian Tube, and Primary Peritoneal Carcinoma, AJCC 8th Edition - Clinical stage from 05/23/2022: FIGO Stage IIIC (cT3c, cN0, cM0) - Signed by Heath Lark, MD on 05/23/2022 Stage prefix: Initial diagnosis   05/29/2022 Imaging   1. Complex bilateral adnexal masses, as described above, consistent with the patient's known malignancy. 2. Subsequent marked severity mass effect on  the distal bilateral common iliac  veins, increased in severity when compared to the prior exam. 3. Additional findings consistent with peritoneal metastasis within the pelvis. 4. Findings consistent with pelvic congestion syndrome. 5. Postoperative changes within the mid and lower lumbar spine. 6. Aortic atherosclerosis. 7. No evidence of venous thrombus within the abdomen, pelvis or proximal bilateral lower extremities.   Aortic Atherosclerosis (ICD10-I70.0).   06/02/2022 -  Chemotherapy   Patient is on Treatment Plan : OVARIAN Carboplatin (AUC 6) + Paclitaxel (175) q21d X 6 Cycles     06/26/2022 Tumor Marker   Patient's tumor was tested for the following markers: CA-125. Results of the tumor marker test revealed 134.    C#3 carboplatin/taxol was on 3/11  Interval History: Patient reports overall doing well.  Appetite has been up and down, weight stable.  Pain comes and goes.  She describes intermittent.  Like cramping in her lower abdomen.  She denies any vaginal bleeding or discharge.  She is having daily bowel movements with the use of MiraLAX.  She thinks the stream of her urine has become a little faster since starting treatment.  She is having fairly significant joint pain, has used several short courses of prednisone when pain gets worse.  Past Medical/Surgical History: Past Medical History:  Diagnosis Date   Anxiety    Asthma    Back problem    disc disease, chronic low back pain, right leg pain, s/p discectomy 99   Cancer (HCC)    ovarian cancer   Depression    Diastolic dysfunction    with elevated LVEDP at cath   Endometriosis    Fibroids    hx of    GERD (gastroesophageal reflux disease)    Hepatitis C    treated 8-9 years ago   Hidradenitis suppurativa    History of kidney stones    Hypertension    Neuromuscular disorder (HCC)    Mulitple sclerosis   Osteoarthritis    Prediabetes    RA (rheumatoid arthritis) ([place])    Rotator cuff tear    right shoulder    Syncope     Past Surgical  History:  Procedure Laterality Date   ANGIOPLASTY     BACK SURGERY     lumbar surgery x 2 done in [place] and [place]   COLONOSCOPY  07/2010   tubular adenoma   COLONOSCOPY  08/2015   DISKECTOMY     IR IMAGING GUIDED PORT INSERTION  05/18/2022   IR US GUIDE BX ASP/DRAIN  05/18/2022   laparoscopy for endometriosis     left shoulder scope     RADIOLOGY WITH ANESTHESIA N/A 05/18/2022   Procedure: IR WITH ANESTHESIA PORT AND BIOPSY;  Surgeon: Aletta Edouard, MD;  Location: [facility];  Service: Radiology;  Laterality: N/A;   ROTATOR CUFF REPAIR Left    Spinal Fusion  12/2016   L4-S1    Family History  Problem Relation Age of Onset   Heart disease Mother        Died with MI 32, chest pain 64s   Hypertension Mother    Pancreatitis Father    Atrial fibrillation Sister    Breast cancer Maternal Aunt    Multiple sclerosis Cousin     Social History   Socioeconomic History   Marital status: Single    Spouse name: Not on file   Number of children: 1   Years of education: Not on file   Highest education level: Not on file  Occupational  History   Not on file  Tobacco Use   Smoking status: Former    Packs/day: .1    Types: Cigarettes    Quit date: 2022    Years since quitting: 2.2   Smokeless tobacco: Never  Vaping Use   Vaping Use: Never used  Substance and Sexual Activity   Alcohol use: Yes    Comment: Occasional    Drug use: Yes    Types: Marijuana   Sexual activity: Yes  Other Topics Concern   Not on file  Social History Narrative   Lives alone   Right handed   Caffeine: none    Social Determinants of Health   Financial Resource Strain: Not on file  Food Insecurity: No Food Insecurity (05/30/2022)   Hunger Vital Sign    Worried About Running Out of Food in the Last Year: Never true    Ran Out of Food in the Last Year: Never true  Transportation Needs: No Transportation Needs (05/30/2022)   PRAPARE - Hydrologist (Medical): No     Lack of Transportation (Non-Medical): No  Physical Activity: Not on file  Stress: Not on file  Social Connections: Not on file    Current Medications:  Current Outpatient Medications:    albuterol (VENTOLIN HFA) 108 (90 Base) MCG/ACT inhaler, Inhale 2 puffs into the lungs every 6 (six) hours as needed for shortness of breath., Disp: , Rfl:    amphetamine-dextroamphetamine (ADDERALL) 20 MG tablet, Take 10-20 mg by mouth See admin instructions. Take 20 mg by mouth in the morning and 10 mg between 1-2 PM daily, Disp: , Rfl:    apixaban (ELIQUIS) 2.5 MG TABS tablet, Take 1 tablet (2.5 mg total) by mouth 2 (two) times daily., Disp: 60 tablet, Rfl: 11   Ascorbic Acid (VITAMIN C) 1000 MG tablet, Take 1,000 mg by mouth once a week., Disp: , Rfl:    ASHWAGANDHA PO, Take 2 capsules by mouth every 7 (seven) days., Disp: , Rfl:    BLACK CURRANT SEED OIL PO, Take 5 mLs by mouth every 7 (seven) days., Disp: , Rfl:    cyclobenzaprine (FLEXERIL) 5 MG tablet, Take 5-10 mg by mouth 3 (three) times daily as needed for muscle spasms., Disp: , Rfl:    diclofenac Sodium (VOLTAREN) 1 % GEL, Apply 2 g topically daily as needed (to affected areas- for pain)., Disp: , Rfl:    hydroxychloroquine (PLAQUENIL) 200 MG tablet, Take 200 mg by mouth daily., Disp: , Rfl:    irbesartan (AVAPRO) 300 MG tablet, Take 300 mg by mouth daily., Disp: , Rfl: 3   lidocaine-prilocaine (EMLA) cream, Apply to affected area once, Disp: 30 g, Rfl: 3   LORazepam (ATIVAN) 0.5 MG tablet, Take 0.5 mg by mouth 3 (three) times daily., Disp: , Rfl:    magic mouthwash (nystatin, lidocaine, diphenhydrAMINE, alum & mag hydroxide) suspension, Swish and swallow 5 mls by mouth 4 times a day as needed for 7 days, Disp: 160 mL, Rfl: 0   magic mouthwash w/lidocaine SOLN, Take 5 mLs by mouth 4 (four) times daily. Take 5 mls 4 x day for 7 days, swish and swallow., Disp: 160 mL, Rfl: 0   meclizine (ANTIVERT) 12.5 MG tablet, Take 12.5 mg by mouth 3 (three) times  daily as needed for dizziness., Disp: , Rfl:    Melatonin 10 MG TABS, Take 10 mg by mouth at bedtime as needed (sleep)., Disp: , Rfl:    metroNIDAZOLE (FLAGYL) 500 MG tablet,  Plan to take two tablets (1000 mg total) at 2 pm, 3 pm, and 10 pm the day BEFORE surgery., Disp: 6 tablet, Rfl: 0   Multiple Vitamin (MULTIVITAMIN WITH MINERALS) TABS tablet, Take 1 tablet by mouth daily., Disp: , Rfl:    mupirocin ointment (BACTROBAN) 2 %, Apply 1 Application topically 3 (three) times daily as needed (boils)., Disp: , Rfl:    naloxone (NARCAN) nasal spray 4 mg/0.1 mL, Place 1 spray into the nose as needed (opioid overdose)., Disp: , Rfl:    omeprazole (PRILOSEC OTC) 20 MG tablet, Take 20 mg by mouth daily as needed (for acid reflux)., Disp: , Rfl:    OVER THE COUNTER MEDICATION, Take 2 capsules by mouth daily. Sea moss supplement, Disp: , Rfl:    OVER THE COUNTER MEDICATION, Take 2 capsules by mouth See admin instructions. "Liver Focus" capsules- Take 2 capsules by mouth at bedtime, Disp: , Rfl:    Oxycodone HCl 20 MG TABS, Take 20 mg by mouth See admin instructions. Take 20 mg by mouth four to five times a day as needed for pain, Disp: , Rfl:    polyethylene glycol (MIRALAX / GLYCOLAX) 17 g packet, Take 17 g by mouth daily., Disp: , Rfl:    POTASSIUM PO, Take 1 tablet by mouth every 7 (seven) days., Disp: , Rfl:    TURMERIC CURCUMIN PO, Take 1,000 mg by mouth daily., Disp: , Rfl:    VITAMIN A PALMITATE PO, Take by mouth., Disp: , Rfl:    VITAMIN A-BETA CAROTENE PO, Take 2,400 Units by mouth once a week., Disp: , Rfl:    Vitamin D, Ergocalciferol, (DRISDOL) 1.25 MG (50000 UNIT) CAPS capsule, Take 50,000 Units by mouth every Sunday., Disp: , Rfl:    WIXELA INHUB 250-50 MCG/ACT AEPB, Inhale 1 puff into the lungs daily., Disp: , Rfl:    XTAMPZA ER 9 MG C12A, Take 13.5 mg by mouth 3 (three) times daily., Disp: , Rfl:    dexamethasone (DECADRON) 4 MG tablet, Take 2 tabs at the night before and 2 tab the morning of  chemotherapy, every 3 weeks, by mouth x 6 cycles (Patient not taking: Reported on 08/02/2022), Disp: 24 tablet, Rfl: 6  Review of Systems: + Change in appetite, hearing loss, vision problems, abdominal pain, joint pain. Denies fevers, chills, fatigue, unexplained weight changes. Denies neck lumps or masses, mouth sores, ringing in ears or voice changes. Denies cough or wheezing.  Denies shortness of breath. Denies chest pain or palpitations. Denies leg swelling. Denies abdominal distention, blood in stools, constipation, diarrhea, nausea, vomiting, or early satiety. Denies pain with intercourse, dysuria, frequency, hematuria or incontinence. Denies hot flashes, pelvic pain, vaginal bleeding or vaginal discharge.   Denies back pain or muscle pain/cramps. Denies itching, rash, or wounds. Denies dizziness, headaches, numbness or seizures. Denies swollen lymph nodes or glands, denies easy bruising or bleeding. Denies anxiety, depression, confusion, or decreased concentration.  Physical Exam: BP 127/67 (BP Location: Left Arm, Patient Position: Sitting)   Pulse 82   Temp 97.9 F (36.6 C) (Oral)   Resp 16   Ht 5\' 9"  (1.753 m)   Wt 175 lb 9.6 oz (79.7 kg)   SpO2 100%   BMI 25.93 kg/m  General: Alert, oriented, no acute distress.  HEENT: Normocephalic, atraumatic. Sclera anicteric.  Chest: Clear to auscultation bilaterally. No wheezes, rhonchi, or rales. Cardiovascular: Regular rate and rhythm, no murmurs, rubs, or gallops.  Abdomen: Normoactive bowel sounds. Soft, nondistended, nontender to palpation. No masses or  hepatosplenomegaly appreciated. No palpable fluid wave.  Extremities: Grossly normal range of motion. Warm, well perfused. No edema bilaterally.  Skin: No rashes or lesions.  Lymphatics: No cervical, supraclavicular, or inguinal adenopathy.  GU: Normal appearing external genitalia without erythema, excoriation, or lesions.  Speculum exam reveals normal-appearing cervix, no  vaginal lesions.  Bimanual exam reveals mobile, small uterus, no nodularity palpated within the cul-de-sac.  Rectovaginal exam confirms these findings, rectum itself is not tethered.  Laboratory & Radiologic Studies: CT A/P on 08/02/22:  Lower chest: Lung bases are clear.   Hepatobiliary: Liver is within normal limits.   Gallbladder is unremarkable. No intrahepatic or extrahepatic ductal dilatation.   Pancreas: Within normal limits.   Spleen: Within normal limits.   Adrenals/Urinary Tract: 2.4 cm right adrenal nodule (series 2/image 17), previously 2.0 cm in 2018, likely reflecting a benign adrenal adenoma. Left adrenal gland is within normal limits.   Left kidney is within normal limits. 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 Associated extrinsic compression at the level of the right adnexal mass (described below).   Mildly thick-walled bladder, although underdistended. Stable 6 mm left UVJ calcification (series 2/image 68).   Stomach/Bowel: Stomach is within normal limits.   No evidence of bowel obstruction.   Normal appendix (series 2/image 47).   Left adnexal mass (described below) is favored to abut and directly invade the left lateral wall of the sigmoid colon (series 2/image 57).   Vascular/Lymphatic: No evidence of abdominal aortic aneurysm.   Atherosclerotic calcifications of the abdominal aorta and branch vessels.   No suspicious abdominopelvic lymphadenopathy.   Reproductive: Heterogeneous uterus with associated surface nodularity (series 2/image 65), likely reflecting peritoneal disease. This appearance is improved from the prior.   3.9 x 3.3 cm left adnexal mass (series 2/image 60), previously 5.3 x 3.9 cm, with extension along the sigmoid mesocolon (series 2/image 56) and suspected involvement of the sigmoid colon (series 2/image 57).   5.6 x 2.7 cm  mixed cystic/solid right adnexal mass (series 2/image 89), previously 6.9 x 4.3 cm when measured in a similar fashion.   Other: No abdominopelvic ascites.   Scattered minimal peritoneal nodularity beneath the anterior abdominal wall, including a 5 mm nodule beneath the left anterior abdominal wall (series 2/image 27) which previously measured 12 mm. Additional mild nodularity on series 2/images 32, 33, and 50.   Musculoskeletal: Status post PLIF at L4-S1. Status post lateral fixation at L4-5. Status post anterior fixation at L5-S1. Mild degenerative changes of the lower thoracic spine.   IMPRESSION: Improving bilateral adnexa masses in this patient with known ovarian cancer, as above.   Suspected involvement of the sigmoid colon. No evidence of bowel obstruction.   Improving peritoneal nodularity, as above.   Additional ancillary findings as above.  Assessment & Plan: [name] is a 67 y.o. 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  Patient is overall done very well with chemotherapy.  We discussed her recent CT scan and looked at the pictures together.  She has radiologic evidence of treatment response.  Last Ca1 25 was a month ago and 134.  Will plan to get a CA125 with her preoperative labs.  Specifically, we discussed that on imaging it appears that the left adnexal mass is likely adherent to and may invade the sigmoid colon.  I think it is very likely that the patient  will require sigmoid resection.  Will plan to give her an antibiotic bowel prep.  We discussed the risk of needing a diverting ostomy although I am hopeful to avoid this.  We reviewed the plan for a diagnostic laparoscopy, open versus robotic assisted hysterectomy, bilateral salpingo-oophorectomy, omentectomy, tumor debulking, possible bowel surgery, and any other indicated procedures. The risks of surgery were discussed in detail and  she understands these to include infection; wound separation; hernia; vaginal cuff separation, injury to adjacent organs such as bowel, bladder, blood vessels, ureters and nerves; bleeding which may require blood transfusion; anesthesia risk; thromboembolic events; possible death; unforeseen complications; possible need for re-exploration; medical complications such as heart attack, stroke, pleural effusion and pneumonia; and, if full lymphadenectomy is performed the risk of lymphedema and lymphocyst.  In terms of bowel surgery, we discussed the risk related to reanastomosis including postoperative leak.  If I am concerned at the time of surgery that the patient is at increased risk of poor healing or leak, I would proceed with diverting ileostomy.  The patient will receive DVT and antibiotic prophylaxis as indicated. She voiced a clear understanding. She had the opportunity to ask questions. Perioperative instructions were reviewed with her. Prescriptions for post-op medications were sent to her pharmacy of choice.  The office will reach out to the patient's primary care provider for preoperative clearance as well as any recommendations regarding Plaquenil and other rheumatoid arthritis medications.  She is on prevention dosing of Eliquis.  We will hold this for 2-3 days before surgery and plan to transition her from phylactic Lovenox after surgery to prophylactic dosing of Eliquis again on discharge.  32 minutes of total time was spent for this patient encounter, including preparation, face-to-face counseling with the patient and coordination of care, and documentation of the encounter.  Jeral Pinch, MD  Division of Gynecologic Oncology  Department of Obstetrics and Gynecology  [facility]

## 2022-08-03 NOTE — Telephone Encounter (Signed)
Called [name] and advised her that we have tentatively scheduled her surgery with Dr. Berline Lopes on 08/16/22.  Discussed that Dr. Berline Lopes will talk to her about surgery tomorrow and that we wanted to let her know in case the hospital called to schedule her preop visit.  She verbalized understanding and agreement.

## 2022-08-03 NOTE — H&P (View-Only) (Signed)
Gynecologic Oncology Return Clinic Visit  08/04/22  Reason for Visit: treatment planning  Treatment History: Oncology History Overview Note  High grade serous   Ovarian cancer, bilateral (HCC)  04/11/2022 Imaging   CT of the abdomen and pelvis on 04/11/2022 reveals a left adnexal masslike area measuring 5.9 x 3.6 cm.  Margins of this mass are irregular.  There is small fluid in the cul-de-sac as well as fullness of soft tissue about the right adnexa.  Discrete uterine structure is not identified.  Left upper quadrant nodules beneath the left hemidiaphragm (1 anterior to the spleen and the other to the stomach).  Omental/peritoneal nodularity and multiple perihepatic nodules are noted    04/18/2022 Tumor Marker   Patient's tumor was tested for the following markers: CA-125. Results of the tumor marker test revealed 123.   04/19/2022 Initial Diagnosis   Carcinomatosis (HCC)   04/28/2022 Imaging   1. Bulky bilateral ovarian masses and nodules, left-greater-than-right, which are essentially confluent with adjacent pelvic soft tissue nodularity and not significantly changed compared to prior examination dated 04/11/2022. 2. Extensive pelvic peritoneal thickening and nodularity. Multiple small peritoneal nodules throughout the abdomen and pelvis. Findings are consistent with peritoneal metastatic disease and likewise not significantly changed. 3. Small volume of loculated appearing fluid in the low pelvis. 4. 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 The mid to distal right ureter is obstructed by right ovarian mass and or soft tissue nodularity. 5. Calculus within the most distal left ureterovesicular junction or just within the bladder lumen measuring 0.7 cm, unchanged. No associated left-sided hydronephrosis. 6. Soft tissue attenuation nodule of the body of the right adrenal gland. Notably,  this was also soft tissue attenuation on prior noncontrast examination (i.e. not definitively macroscopic fat containing) although unchanged compared to prior examinations dating back to 2018 and almost certainly a benign adenoma. Attention on follow-up. 7. No evidence of metastatic disease in the chest. 8. Mild diffuse bilateral bronchial wall thickening. Background of very fine centrilobular nodularity, most concentrated in the lung apices. Findings are most consistent with smoking-related respiratory bronchiolitis.     05/18/2022 Procedure   Ultrasound-guided core biopsy performed of a 10 mm soft tissue peritoneal mass in the right upper quadrant just superficial to the right lobe of the liver. The procedure was performed under general anesthesia immediately following Port-A-Cath placement.   05/18/2022 Procedure   Placement of single lumen port a cath via right internal jugular vein. The catheter tip lies at the cavo-atrial junction. A power injectable port a cath was placed and is ready for immediate use.     05/18/2022 Pathology Results   A. 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  The findings support the above interpretation.  This case was reviewed with Dr. Kashikar who agrees with the above diagnosis.  A p16 stain is pending and will be reported in an addendum.    05/23/2022 Cancer Staging   Staging form: Ovary, Fallopian Tube, and Primary Peritoneal Carcinoma, AJCC 8th Edition - Clinical stage from 05/23/2022: FIGO Stage IIIC (cT3c, cN0, cM0) - Signed by Gorsuch, Ni, MD on 05/23/2022 Stage prefix: Initial diagnosis   05/29/2022 Imaging   1. Complex bilateral adnexal masses, as described above, consistent with the patient's known malignancy. 2. Subsequent marked severity mass effect on   the distal bilateral common iliac  veins, increased in severity when compared to the prior exam. 3. Additional findings consistent with peritoneal metastasis within the pelvis. 4. Findings consistent with pelvic congestion syndrome. 5. Postoperative changes within the mid and lower lumbar spine. 6. Aortic atherosclerosis. 7. No evidence of venous thrombus within the abdomen, pelvis or proximal bilateral lower extremities.   Aortic Atherosclerosis (ICD10-I70.0).   06/02/2022 -  Chemotherapy   Patient is on Treatment Plan : OVARIAN Carboplatin (AUC 6) + Paclitaxel (175) q21d X 6 Cycles     06/26/2022 Tumor Marker   Patient's tumor was tested for the following markers: CA-125. Results of the tumor marker test revealed 134.    C#3 carboplatin/taxol was on 3/11  Interval History: Patient reports overall doing well.  Appetite has been up and down, weight stable.  Pain comes and goes.  She describes intermittent.  Like cramping in her lower abdomen.  She denies any vaginal bleeding or discharge.  She is having daily bowel movements with the use of MiraLAX.  She thinks the stream of her urine has become a little faster since starting treatment.  She is having fairly significant joint pain, has used several short courses of prednisone when pain gets worse.  Past Medical/Surgical History: Past Medical History:  Diagnosis Date   Anxiety    Asthma    Back problem    disc disease, chronic low back pain, right leg pain, s/p discectomy 99   Cancer (HCC)    ovarian cancer   Depression    Diastolic dysfunction    with elevated LVEDP at cath   Endometriosis    Fibroids    hx of    GERD (gastroesophageal reflux disease)    Hepatitis C    treated 8-9 years ago   Hidradenitis suppurativa    History of kidney stones    Hypertension    Neuromuscular disorder (HCC)    Mulitple sclerosis   Osteoarthritis    Prediabetes    RA (rheumatoid arthritis) (HCC)    Rotator cuff tear    right shoulder    Syncope     Past Surgical  History:  Procedure Laterality Date   ANGIOPLASTY     BACK SURGERY     lumbar surgery x 2 done in [place] and [place]   COLONOSCOPY  07/2010   tubular adenoma   COLONOSCOPY  08/2015   DISKECTOMY     IR IMAGING GUIDED PORT INSERTION  05/18/2022   IR US GUIDE BX ASP/DRAIN  05/18/2022   laparoscopy for endometriosis     left shoulder scope     RADIOLOGY WITH ANESTHESIA N/A 05/18/2022   Procedure: IR WITH ANESTHESIA PORT AND BIOPSY;  Surgeon: Yamagata, Glenn, MD;  Location: [facility];  Service: Radiology;  Laterality: N/A;   ROTATOR CUFF REPAIR Left    Spinal Fusion  12/2016   L4-S1    Family History  Problem Relation Age of Onset   Heart disease Mother        Died with MI 65, chest pain 30s   Hypertension Mother    Pancreatitis Father    Atrial fibrillation Sister    Breast cancer Maternal Aunt    Multiple sclerosis Cousin     Social History   Socioeconomic History   Marital status: Single    Spouse name: Not on file   Number of children: 1   Years of education: Not on file   Highest education level: Not on file  Occupational   History   Not on file  Tobacco Use   Smoking status: Former    Packs/day: .1    Types: Cigarettes    Quit date: 2022    Years since quitting: 2.2   Smokeless tobacco: Never  Vaping Use   Vaping Use: Never used  Substance and Sexual Activity   Alcohol use: Yes    Comment: Occasional    Drug use: Yes    Types: Marijuana   Sexual activity: Yes  Other Topics Concern   Not on file  Social History Narrative   Lives alone   Right handed   Caffeine: none    Social Determinants of Health   Financial Resource Strain: Not on file  Food Insecurity: No Food Insecurity (05/30/2022)   Hunger Vital Sign    Worried About Running Out of Food in the Last Year: Never true    Ran Out of Food in the Last Year: Never true  Transportation Needs: No Transportation Needs (05/30/2022)   PRAPARE - Transportation    Lack of Transportation (Medical): No     Lack of Transportation (Non-Medical): No  Physical Activity: Not on file  Stress: Not on file  Social Connections: Not on file    Current Medications:  Current Outpatient Medications:    albuterol (VENTOLIN HFA) 108 (90 Base) MCG/ACT inhaler, Inhale 2 puffs into the lungs every 6 (six) hours as needed for shortness of breath., Disp: , Rfl:    amphetamine-dextroamphetamine (ADDERALL) 20 MG tablet, Take 10-20 mg by mouth See admin instructions. Take 20 mg by mouth in the morning and 10 mg between 1-2 PM daily, Disp: , Rfl:    apixaban (ELIQUIS) 2.5 MG TABS tablet, Take 1 tablet (2.5 mg total) by mouth 2 (two) times daily., Disp: 60 tablet, Rfl: 11   Ascorbic Acid (VITAMIN C) 1000 MG tablet, Take 1,000 mg by mouth once a week., Disp: , Rfl:    ASHWAGANDHA PO, Take 2 capsules by mouth every 7 (seven) days., Disp: , Rfl:    BLACK CURRANT SEED OIL PO, Take 5 mLs by mouth every 7 (seven) days., Disp: , Rfl:    cyclobenzaprine (FLEXERIL) 5 MG tablet, Take 5-10 mg by mouth 3 (three) times daily as needed for muscle spasms., Disp: , Rfl:    diclofenac Sodium (VOLTAREN) 1 % GEL, Apply 2 g topically daily as needed (to affected areas- for pain)., Disp: , Rfl:    hydroxychloroquine (PLAQUENIL) 200 MG tablet, Take 200 mg by mouth daily., Disp: , Rfl:    irbesartan (AVAPRO) 300 MG tablet, Take 300 mg by mouth daily., Disp: , Rfl: 3   lidocaine-prilocaine (EMLA) cream, Apply to affected area once, Disp: 30 g, Rfl: 3   LORazepam (ATIVAN) 0.5 MG tablet, Take 0.5 mg by mouth 3 (three) times daily., Disp: , Rfl:    magic mouthwash (nystatin, lidocaine, diphenhydrAMINE, alum & mag hydroxide) suspension, Swish and swallow 5 mls by mouth 4 times a day as needed for 7 days, Disp: 160 mL, Rfl: 0   magic mouthwash w/lidocaine SOLN, Take 5 mLs by mouth 4 (four) times daily. Take 5 mls 4 x day for 7 days, swish and swallow., Disp: 160 mL, Rfl: 0   meclizine (ANTIVERT) 12.5 MG tablet, Take 12.5 mg by mouth 3 (three) times  daily as needed for dizziness., Disp: , Rfl:    Melatonin 10 MG TABS, Take 10 mg by mouth at bedtime as needed (sleep)., Disp: , Rfl:    metroNIDAZOLE (FLAGYL) 500 MG tablet,   Plan to take two tablets (1000 mg total) at 2 pm, 3 pm, and 10 pm the day BEFORE surgery., Disp: 6 tablet, Rfl: 0   Multiple Vitamin (MULTIVITAMIN WITH MINERALS) TABS tablet, Take 1 tablet by mouth daily., Disp: , Rfl:    mupirocin ointment (BACTROBAN) 2 %, Apply 1 Application topically 3 (three) times daily as needed (boils)., Disp: , Rfl:    naloxone (NARCAN) nasal spray 4 mg/0.1 mL, Place 1 spray into the nose as needed (opioid overdose)., Disp: , Rfl:    omeprazole (PRILOSEC OTC) 20 MG tablet, Take 20 mg by mouth daily as needed (for acid reflux)., Disp: , Rfl:    OVER THE COUNTER MEDICATION, Take 2 capsules by mouth daily. Sea moss supplement, Disp: , Rfl:    OVER THE COUNTER MEDICATION, Take 2 capsules by mouth See admin instructions. "Liver Focus" capsules- Take 2 capsules by mouth at bedtime, Disp: , Rfl:    Oxycodone HCl 20 MG TABS, Take 20 mg by mouth See admin instructions. Take 20 mg by mouth four to five times a day as needed for pain, Disp: , Rfl:    polyethylene glycol (MIRALAX / GLYCOLAX) 17 g packet, Take 17 g by mouth daily., Disp: , Rfl:    POTASSIUM PO, Take 1 tablet by mouth every 7 (seven) days., Disp: , Rfl:    TURMERIC CURCUMIN PO, Take 1,000 mg by mouth daily., Disp: , Rfl:    VITAMIN A PALMITATE PO, Take by mouth., Disp: , Rfl:    VITAMIN A-BETA CAROTENE PO, Take 2,400 Units by mouth once a week., Disp: , Rfl:    Vitamin D, Ergocalciferol, (DRISDOL) 1.25 MG (50000 UNIT) CAPS capsule, Take 50,000 Units by mouth every Sunday., Disp: , Rfl:    WIXELA INHUB 250-50 MCG/ACT AEPB, Inhale 1 puff into the lungs daily., Disp: , Rfl:    XTAMPZA ER 9 MG C12A, Take 13.5 mg by mouth 3 (three) times daily., Disp: , Rfl:    dexamethasone (DECADRON) 4 MG tablet, Take 2 tabs at the night before and 2 tab the morning of  chemotherapy, every 3 weeks, by mouth x 6 cycles (Patient not taking: Reported on 08/02/2022), Disp: 24 tablet, Rfl: 6  Review of Systems: + Change in appetite, hearing loss, vision problems, abdominal pain, joint pain. Denies fevers, chills, fatigue, unexplained weight changes. Denies neck lumps or masses, mouth sores, ringing in ears or voice changes. Denies cough or wheezing.  Denies shortness of breath. Denies chest pain or palpitations. Denies leg swelling. Denies abdominal distention, blood in stools, constipation, diarrhea, nausea, vomiting, or early satiety. Denies pain with intercourse, dysuria, frequency, hematuria or incontinence. Denies hot flashes, pelvic pain, vaginal bleeding or vaginal discharge.   Denies back pain or muscle pain/cramps. Denies itching, rash, or wounds. Denies dizziness, headaches, numbness or seizures. Denies swollen lymph nodes or glands, denies easy bruising or bleeding. Denies anxiety, depression, confusion, or decreased concentration.  Physical Exam: BP 127/67 (BP Location: Left Arm, Patient Position: Sitting)   Pulse 82   Temp 97.9 F (36.6 C) (Oral)   Resp 16   Ht 5' 9" (1.753 m)   Wt 175 lb 9.6 oz (79.7 kg)   SpO2 100%   BMI 25.93 kg/m  General: Alert, oriented, no acute distress.  HEENT: Normocephalic, atraumatic. Sclera anicteric.  Chest: Clear to auscultation bilaterally. No wheezes, rhonchi, or rales. Cardiovascular: Regular rate and rhythm, no murmurs, rubs, or gallops.  Abdomen: Normoactive bowel sounds. Soft, nondistended, nontender to palpation. No masses or   hepatosplenomegaly appreciated. No palpable fluid wave.  Extremities: Grossly normal range of motion. Warm, well perfused. No edema bilaterally.  Skin: No rashes or lesions.  Lymphatics: No cervical, supraclavicular, or inguinal adenopathy.  GU: Normal appearing external genitalia without erythema, excoriation, or lesions.  Speculum exam reveals normal-appearing cervix, no  vaginal lesions.  Bimanual exam reveals mobile, small uterus, no nodularity palpated within the cul-de-sac.  Rectovaginal exam confirms these findings, rectum itself is not tethered.  Laboratory & Radiologic Studies: CT A/P on 08/02/22:  Lower chest: Lung bases are clear.   Hepatobiliary: Liver is within normal limits.   Gallbladder is unremarkable. No intrahepatic or extrahepatic ductal dilatation.   Pancreas: Within normal limits.   Spleen: Within normal limits.   Adrenals/Urinary Tract: 2.4 cm right adrenal nodule (series 2/image 17), previously 2.0 cm in 2018, likely reflecting a benign adrenal adenoma. Left adrenal gland is within normal limits.   Left kidney is within normal limits. 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 Associated extrinsic compression at the level of the right adnexal mass (described below).   Mildly thick-walled bladder, although underdistended. Stable 6 mm left UVJ calcification (series 2/image 68).   Stomach/Bowel: Stomach is within normal limits.   No evidence of bowel obstruction.   Normal appendix (series 2/image 47).   Left adnexal mass (described below) is favored to abut and directly invade the left lateral wall of the sigmoid colon (series 2/image 57).   Vascular/Lymphatic: No evidence of abdominal aortic aneurysm.   Atherosclerotic calcifications of the abdominal aorta and branch vessels.   No suspicious abdominopelvic lymphadenopathy.   Reproductive: Heterogeneous uterus with associated surface nodularity (series 2/image 65), likely reflecting peritoneal disease. This appearance is improved from the prior.   3.9 x 3.3 cm left adnexal mass (series 2/image 60), previously 5.3 x 3.9 cm, with extension along the sigmoid mesocolon (series 2/image 56) and suspected involvement of the sigmoid colon (series 2/image 57).   5.6 x 2.7 cm  mixed cystic/solid right adnexal mass (series 2/image 89), previously 6.9 x 4.3 cm when measured in a similar fashion.   Other: No abdominopelvic ascites.   Scattered minimal peritoneal nodularity beneath the anterior abdominal wall, including a 5 mm nodule beneath the left anterior abdominal wall (series 2/image 27) which previously measured 12 mm. Additional mild nodularity on series 2/images 32, 33, and 50.   Musculoskeletal: Status post PLIF at L4-S1. Status post lateral fixation at L4-5. Status post anterior fixation at L5-S1. Mild degenerative changes of the lower thoracic spine.   IMPRESSION: Improving bilateral adnexa masses in this patient with known ovarian cancer, as above.   Suspected involvement of the sigmoid colon. No evidence of bowel obstruction.   Improving peritoneal nodularity, as above.   Additional ancillary findings as above.  Assessment & Plan: [name] is a 66 y.o. 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  Patient is overall done very well with chemotherapy.  We discussed her recent CT scan and looked at the pictures together.  She has radiologic evidence of treatment response.  Last Ca1 25 was a month ago and 134.  Will plan to get a CA125 with her preoperative labs.  Specifically, we discussed that on imaging it appears that the left adnexal mass is likely adherent to and may invade the sigmoid colon.  I think it is very likely that the patient   will require sigmoid resection.  Will plan to give her an antibiotic bowel prep.  We discussed the risk of needing a diverting ostomy although I am hopeful to avoid this.  We reviewed the plan for a diagnostic laparoscopy, open versus robotic assisted hysterectomy, bilateral salpingo-oophorectomy, omentectomy, tumor debulking, possible bowel surgery, and any other indicated procedures. The risks of surgery were discussed in detail and  she understands these to include infection; wound separation; hernia; vaginal cuff separation, injury to adjacent organs such as bowel, bladder, blood vessels, ureters and nerves; bleeding which may require blood transfusion; anesthesia risk; thromboembolic events; possible death; unforeseen complications; possible need for re-exploration; medical complications such as heart attack, stroke, pleural effusion and pneumonia; and, if full lymphadenectomy is performed the risk of lymphedema and lymphocyst.  In terms of bowel surgery, we discussed the risk related to reanastomosis including postoperative leak.  If I am concerned at the time of surgery that the patient is at increased risk of poor healing or leak, I would proceed with diverting ileostomy.  The patient will receive DVT and antibiotic prophylaxis as indicated. She voiced a clear understanding. She had the opportunity to ask questions. Perioperative instructions were reviewed with her. Prescriptions for post-op medications were sent to her pharmacy of choice.  The office will reach out to the patient's primary care provider for preoperative clearance as well as any recommendations regarding Plaquenil and other rheumatoid arthritis medications.  She is on prevention dosing of Eliquis.  We will hold this for 2-3 days before surgery and plan to transition her from phylactic Lovenox after surgery to prophylactic dosing of Eliquis again on discharge.  32 minutes of total time was spent for this patient encounter, including preparation, face-to-face counseling with the patient and coordination of care, and documentation of the encounter.  Demetrius Mahler, MD  Division of Gynecologic Oncology  Department of Obstetrics and Gynecology  [facility]   

## 2022-08-04 ENCOUNTER — Other Ambulatory Visit (HOSPITAL_COMMUNITY): Payer: Self-pay

## 2022-08-04 ENCOUNTER — Telehealth: Payer: Self-pay

## 2022-08-04 ENCOUNTER — Encounter: Payer: Self-pay | Admitting: Gynecologic Oncology

## 2022-08-04 ENCOUNTER — Inpatient Hospital Stay (HOSPITAL_BASED_OUTPATIENT_CLINIC_OR_DEPARTMENT_OTHER): Payer: 59 | Admitting: Gynecologic Oncology

## 2022-08-04 VITALS — BP 127/67 | HR 82 | Temp 97.9°F | Resp 16 | Ht 69.0 in | Wt 175.6 lb

## 2022-08-04 DIAGNOSIS — C800 Disseminated malignant neoplasm, unspecified: Secondary | ICD-10-CM

## 2022-08-04 DIAGNOSIS — G8929 Other chronic pain: Secondary | ICD-10-CM | POA: Diagnosis not present

## 2022-08-04 DIAGNOSIS — M069 Rheumatoid arthritis, unspecified: Secondary | ICD-10-CM | POA: Diagnosis not present

## 2022-08-04 DIAGNOSIS — Z7189 Other specified counseling: Secondary | ICD-10-CM | POA: Diagnosis not present

## 2022-08-04 DIAGNOSIS — C786 Secondary malignant neoplasm of retroperitoneum and peritoneum: Secondary | ICD-10-CM

## 2022-08-04 DIAGNOSIS — N838 Other noninflammatory disorders of ovary, fallopian tube and broad ligament: Secondary | ICD-10-CM

## 2022-08-04 DIAGNOSIS — Z5111 Encounter for antineoplastic chemotherapy: Secondary | ICD-10-CM | POA: Diagnosis not present

## 2022-08-04 DIAGNOSIS — G893 Neoplasm related pain (acute) (chronic): Secondary | ICD-10-CM | POA: Diagnosis not present

## 2022-08-04 DIAGNOSIS — R971 Elevated cancer antigen 125 [CA 125]: Secondary | ICD-10-CM | POA: Diagnosis not present

## 2022-08-04 DIAGNOSIS — C563 Malignant neoplasm of bilateral ovaries: Secondary | ICD-10-CM

## 2022-08-04 DIAGNOSIS — Z79891 Long term (current) use of opiate analgesic: Secondary | ICD-10-CM | POA: Diagnosis not present

## 2022-08-04 MED ORDER — METRONIDAZOLE 500 MG PO TABS
ORAL_TABLET | ORAL | 0 refills | Status: DC
  Filled 2022-08-04: qty 6, 1d supply, fill #0

## 2022-08-04 NOTE — Telephone Encounter (Signed)
Attempted to call Dr. Corey Skains' office. They are close Friday 08-03-29 for the [facility].

## 2022-08-04 NOTE — Patient Instructions (Addendum)
Preparing for your Surgery  Plan for surgery on August 16, 2022 with Dr. Jeral Pinch at [place] will be scheduled for diagnostic laparoscopy (looking into the abdomen/pelvis through a small incision with a camera, robotic assisted laparoscopic versus open total hysterectomy (removal of the uterus and cervix), bilateral salpingo-oophorectomy, omentectomy (removal of the omentum), tumor debulking, possible bowel surgery.   Pre-operative Testing -You will receive a phone call from presurgical testing at [facility] to arrange for a pre-operative appointment and lab work.  -Bring your insurance card, copy of an advanced directive if applicable, medication list  -At that visit, you will be asked to sign a consent for a possible blood transfusion in case a transfusion becomes necessary during surgery.  The need for a blood transfusion is rare but having consent is a necessary part of your care.     -You will need to hold your Eliquis for 3 days before surgery. Last dose would be morning dose on April 7. We will also reach out to your PCP about your plaquenil and when to stop this.  -Do not take supplements such as fish oil (omega 3), red yeast rice, turmeric before your surgery. You want to avoid medications with aspirin in them including headache powders such as BC or Goody's), Excedrin migraine.  Day Before Surgery at Home -See the instructions about taking antibiotics the day before surgery. You will be advised you can have clear liquids up until 3 hours before your surgery.    Examples including soups, broths, toast, yogurt, mashed potatoes.  AVOID GAS PRODUCING BEVERAGES such as carbonated beverages (fizzy beverages, sodas).   If your bowels are filled with gas, your surgeon will have difficulty visualizing your pelvic organs which increases your surgical risks.  Your role in recovery Your role is to become active as soon as directed by your doctor, while still  giving yourself time to heal.  Rest when you feel tired. You will be asked to do the following in order to speed your recovery:  - Cough and breathe deeply. This helps to clear and expand your lungs and can prevent pneumonia after surgery.  - [place]. Do mild physical activity. Walking or moving your legs help your circulation and body functions return to normal. Do not try to get up or walk alone the first time after surgery.   -If you develop swelling on one leg or the other, pain in the back of your leg, redness/warmth in one of your legs, please call the office or go to the Emergency Room to have a doppler to rule out a blood clot. For shortness of breath, chest pain-seek care in the Emergency Room as soon as possible. - Actively manage your pain. Managing your pain lets you move in comfort. We will ask you to rate your pain on a scale of zero to 10. It is your responsibility to tell your doctor or nurse where and how much you hurt so your pain can be treated.  Special Considerations -If you are diabetic, you may be placed on insulin after surgery to have closer control over your blood sugars to promote healing and recovery.  This does not mean that you will be discharged on insulin.  If applicable, your oral antidiabetics will be resumed when you are tolerating a solid diet.  -Your final pathology results from surgery should be available around one week after surgery and the results will be relayed to you when available.  -  Dr. Lahoma Crocker is the surgeon that assists your GYN Oncologist with surgery.  If you end up staying the night, the next day after your surgery you will either see Dr. Berline Lopes, Dr. Ernestina Patches, or Dr. Lahoma Crocker.  -FMLA forms can be faxed to [phone] and please allow 5-7 business days for completion.  Pain Management After Surgery -We will reach out to your chronic pain specialist to notify them of your upcoming surgery.   -Make sure that  you have Tylenol IF YOU ARE ABLE TO TAKE THESE MEDICATION at home to use on a regular basis after surgery for pain control.   -Review the attached handout on narcotic use and their risks and side effects.   Bowel Regimen -It is important to prevent constipation and drink adequate amounts of liquids. You may be prescribed medication to prevent constipation after surgery.  Risks of Surgery Risks of surgery are low but include bleeding, infection, damage to surrounding structures, re-operation, blood clots, and very rarely death.   Blood Transfusion Information (For the consent to be signed before surgery)  We will be checking your blood type before surgery so in case of emergencies, we will know what type of blood you would need.                                            WHAT IS A BLOOD TRANSFUSION?  A transfusion is the replacement of blood or some of its parts. Blood is made up of multiple cells which provide different functions. Red blood cells carry oxygen and are used for blood loss replacement. White blood cells fight against infection. Platelets control bleeding. Plasma helps clot blood. Other blood products are available for specialized needs, such as hemophilia or other clotting disorders. BEFORE THE TRANSFUSION  Who gives blood for transfusions?  You may be able to donate blood to be used at a later date on yourself (autologous donation). Relatives can be asked to donate blood. This is generally not any safer than if you have received blood from a stranger. The same precautions are taken to ensure safety when a relative's blood is donated. Healthy volunteers who are fully evaluated to make sure their blood is safe. This is blood bank blood. Transfusion therapy is the safest it has ever been in the practice of medicine. Before blood is taken from a donor, a complete history is taken to make sure that person has no history of diseases nor engages in risky social behavior (examples  are intravenous drug use or sexual activity with multiple partners). The donor's travel history is screened to minimize risk of transmitting infections, such as malaria. The donated blood is tested for signs of infectious diseases, such as HIV and hepatitis. The blood is then tested to be sure it is compatible with you in order to minimize the chance of a transfusion reaction. If you or a relative donates blood, this is often done in anticipation of surgery and is not appropriate for emergency situations. It takes many days to process the donated blood. RISKS AND COMPLICATIONS Although transfusion therapy is very safe and saves many lives, the main dangers of transfusion include:  Getting an infectious disease. Developing a transfusion reaction. This is an allergic reaction to something in the blood you were given. Every precaution is taken to prevent this. The decision to have a blood transfusion has been considered carefully  by your caregiver before blood is given. Blood is not given unless the benefits outweigh the risks.  AFTER SURGERY INSTRUCTIONS  Return to work: 4-6 weeks if applicable  You may have a white honeycomb dressing over your larger incision. This dressing can be removed 5 days after surgery and you do not need to reapply a new dressing. Once you remove the dressing, you will notice that you have the surgical glue (dermabond) on the incision and this will peel off on its own. You can get this dressing wet in the shower the days after surgery prior to removal on the 5th day.   Activity: 1. Be up and out of the bed during the day.  Take a nap if needed.  You may walk up steps but be careful and use the hand rail.  Stair climbing will tire you more than you think, you may need to stop part way and rest.   2. No lifting or straining for 6 weeks over 10 pounds. No pushing, pulling, straining for 6 weeks.  3. No driving for around [id number] days.  Do not drive if you are taking narcotic pain  medicine and make sure that your reaction time has returned.   4. You can shower as soon as the next day after surgery. Shower daily.  Use your regular soap and water (not directly on the incision) and pat your incision(s) dry afterwards; don't rub.  No tub baths or submerging your body in water until cleared by your surgeon. If you have the soap that was given to you by pre-surgical testing that was used before surgery, you do not need to use it afterwards because this can irritate your incisions.   5. No sexual activity and nothing in the vagina for 10-12 weeks.  6. You may experience a small amount of clear drainage from your incisions, which is normal.  If the drainage persists, increases, or changes color please call the office.  7. Do not use creams, lotions, or ointments such as neosporin on your incisions after surgery until advised by your surgeon because they can cause removal of the dermabond glue on your incisions.    8. You may experience vaginal spotting after surgery or around the 6-8 week mark from surgery when the stitches at the top of the vagina begin to dissolve.  The spotting is normal but if you experience heavy bleeding, call our office.  9. Take Tylenol first for pain if you are able to take these medications and only use narcotic pain medication for severe pain not relieved by the Tylenol.  Monitor your Tylenol intake to a max of 4,000 mg in a 24 hour period.   Diet: 1. Low sodium Heart Healthy Diet is recommended but you are cleared to resume your normal (before surgery) diet after your procedure.  2. It is important to keep bowel movements regular and to prevent constipation.    Wound Care: 1. Keep clean and dry.  Shower daily.  Reasons to call the Doctor: Fever - Oral temperature greater than 100.4 degrees Fahrenheit Foul-smelling vaginal discharge Difficulty urinating Nausea and vomiting Increased pain at the site of the incision that is unrelieved with pain  medicine. Difficulty breathing with or without chest pain New calf pain especially if only on one side Sudden, continuing increased vaginal bleeding with or without clots.   Contacts: For questions or concerns you should contact:  Dr. Jeral Pinch at [phone]  Joylene John, NP at [phone]  After Hours: call  [phone] and have the GYN Oncologist paged/contacted (after 5 pm or on the weekends). You will speak with an after hours RN and let he or she know you have had surgery.  Messages sent via mychart are for non-urgent matters and are not responded to after hours so for urgent needs, please call the after hours number.   COLON BOWEL PREP WITH JUST ANTIBIOTICS ONLY     FIVE DAYS PRIOR TO YOUR SURGERY   Obtain supplies for the bowel prep at a pharmacy of your choice: Office sent prescription for your antibiotic pill (Flagyl)    Improve nutrition: Consider drinking 2-3 nutritional shakes (Ex: Ensure Surgery) every day, starting 5 days prior to surgery     DAY PRIOR TO SURGERY    Switch to a full liquid diet the day before surgery Drink plenty of liquids all day to avoid getting dehydrated     2:00pm Take 2 Flagyl (total of 1000 mg) tablets   3:00pm Take 2 Flagyl (total of 1000 mg) tablets Drink plenty of clear liquids all evening to avoid getting dehydrated   10:00pm Take 2 Flagyl (total of 1000 mg) tablets Do not eat anything solid after bedtime (midnight) the night before your surgery.   BUT DO drink plenty of clear liquids (Water, Gatorade, juice, soda, coffee, tea, broths, etc.) up to 3 hours prior to surgery to avoid getting dehydrated.      MORNING OF SURGERY   Remember to not to eat anything solid that morning  Stop drinking liquids before you leave the house (>3 hours prior to surgery)   WHAT DO I NEED TO KNOW ABOUT A FULL LIQUID DIET? -You may have any liquid. -You may have any food that becomes a liquid at room temperature. The food is  considered a liquid if it can be poured off a spoon at room temperature. WHAT FOODS CAN I EAT? -Grains: Any grain food that can be pureed in soup (such as crackers, pasta, and rice). Hot cereal (such as farina or oatmeal) that has been blended.  -Fruits: Fruit juice, including nectars and juices. -Meats and Other Protein Sources: Eggs in custard, eggnog mix, and eggs used in ice cream or pudding. Strained meats, like in baby food, may be allowed. Consult your health care provider.  -Dairy: Milk and milk-based beverages, including milk shakes and instant breakfast mixes. Smooth yogurt. Pureed cottage cheese. Avoid these foods if they are not well tolerated. -Beverages: All beverages, including liquid nutritional supplements. AVOID CARBONATED BEVERAGES. -Condiments: Iodized salt, pepper, spices, and flavorings. Cocoa powder. Vinegar, ketchup, yellow mustard, smooth sauces (such as hollandaise, cheese sauce, or white sauce), and soy sauce. -Sweets and Desserts: Custard, smooth pudding. Flavored gelatin. Tapioca, junket. Plain ice cream, sherbet, fruit ices. Frozen ice pops, frozen fudge pops, pudding pops, and other frozen bars with cream. Syrups, including chocolate syrup. Sugar, honey, jelly.  -Fats and Oils: Margarine, butter, cream, sour cream, and oils. -Other: Broth and cream soups. Strained, broth-based soups. The items listed above may not be a complete list of recommended foods or beverages.  WHAT FOODS CAN I NOT EAT? Grains: All breads. Grains are not allowed unless they are pureed into soup. Vegetables: No raw vegetables. Vegetables are not allowed unless they are juiced, or cooked and pureed into soup. Fruits: Fruits are not allowed unless they are juiced. Meats and Other Protein Sources: Any meat or fish. Cooked or raw eggs. Nut butters.  Dairy: Cheese.  Condiments: Stone ground mustards. Fats and Oils: Fats that  are coarse or chunky. Sweets and Desserts: Ice cream or other frozen  desserts that have any solids in them or on top, such as nuts, chocolate chips, and pieces of cookies. Cakes. Cookies. Candy. Others: Soups with chunks or pieces in them.

## 2022-08-04 NOTE — Telephone Encounter (Signed)
-----   Message from Dorothyann Gibbs, NP sent at 08/04/2022  8:40 AM EDT ----- Looks like patient's chronic pain MD is:  Eleanora Neighbor, MD [address] [place], [place] [address]  Please reach out to their office and tell them she will be having major surgery on 4/10 and will prob have extra pain needs etc. Will they manage this or do they want [place] to send in additional meds for breakthrough.

## 2022-08-04 NOTE — Progress Notes (Signed)
Patient here for follow up with Dr. Jeral Pinch and for a pre-operative appointment prior to her scheduled surgery on August 16, 2022. She is scheduled for diagnostic laparoscopy, robotic assisted laparoscopic versus open total hysterectomy, bilateral salpingo-oophorectomy, omentectomy, tumor debulking, possible bowel surgery. The surgery was discussed in detail.  See after visit summary for additional details. Visual aids used to discuss items related to surgery including the incentive spirometer, sequential compression stockings, foley catheter, IV pump, multi-modal pain regimen including tylenol, female reproductive system to discuss surgery in detail.      Discussed post-op pain management in detail including the aspects of the enhanced recovery pathway. Our office will reach out to her chronic pain specialist and inform them she will be having surgery and will have increased pain needs. Also advised post-op laxative use will be determined after surgery based on if bowel surgery is needed.   Discussed the use of SCDs and measures to take at home to prevent DVT including frequent mobility.  Reportable signs and symptoms of DVT discussed. Post-operative instructions discussed and expectations for after surgery. Incisional care discussed as well including reportable signs and symptoms including erythema, drainage, wound separation.     10 minutes spent with the patient and preparing information.  Verbalizing understanding of material discussed. No needs or concerns voiced at the end of the visit.   Advised patient to call for any needs.    This appointment is included in the global surgical bundle as pre-operative teaching and has no charge.

## 2022-08-04 NOTE — Patient Instructions (Signed)
Preparing for your Surgery   Plan for surgery on August 16, 2022 with Dr. Jeral Pinch at [place] will be scheduled for diagnostic laparoscopy (looking into the abdomen/pelvis through a small incision with a camera, robotic assisted laparoscopic versus open total hysterectomy (removal of the uterus and cervix), bilateral salpingo-oophorectomy, omentectomy (removal of the omentum), tumor debulking, possible bowel surgery.    Pre-operative Testing -You will receive a phone call from presurgical testing at [facility] to arrange for a pre-operative appointment and lab work.   -Bring your insurance card, copy of an advanced directive if applicable, medication list   -At that visit, you will be asked to sign a consent for a possible blood transfusion in case a transfusion becomes necessary during surgery.  The need for a blood transfusion is rare but having consent is a necessary part of your care.      -You will need to hold your Eliquis for 3 days before surgery. Last dose would be morning dose on April 7. We will also reach out to your PCP about your plaquenil and when to stop this.   -Do not take supplements such as fish oil (omega 3), red yeast rice, turmeric before your surgery. You want to avoid medications with aspirin in them including headache powders such as BC or Goody's), Excedrin migraine.   Day Before Surgery at Home -See the instructions about taking antibiotics the day before surgery. You will be advised you can have clear liquids up until 3 hours before your surgery.     Examples including soups, broths, toast, yogurt, mashed potatoes.  AVOID GAS PRODUCING BEVERAGES such as carbonated beverages (fizzy beverages, sodas).    If your bowels are filled with gas, your surgeon will have difficulty visualizing your pelvic organs which increases your surgical risks.   Your role in recovery Your role is to become active as soon as directed by your doctor,  while still giving yourself time to heal.  Rest when you feel tired. You will be asked to do the following in order to speed your recovery:   - Cough and breathe deeply. This helps to clear and expand your lungs and can prevent pneumonia after surgery.  - [place]. Do mild physical activity. Walking or moving your legs help your circulation and body functions return to normal. Do not try to get up or walk alone the first time after surgery.   -If you develop swelling on one leg or the other, pain in the back of your leg, redness/warmth in one of your legs, please call the office or go to the Emergency Room to have a doppler to rule out a blood clot. For shortness of breath, chest pain-seek care in the Emergency Room as soon as possible. - Actively manage your pain. Managing your pain lets you move in comfort. We will ask you to rate your pain on a scale of zero to 10. It is your responsibility to tell your doctor or nurse where and how much you hurt so your pain can be treated.   Special Considerations -If you are diabetic, you may be placed on insulin after surgery to have closer control over your blood sugars to promote healing and recovery.  This does not mean that you will be discharged on insulin.  If applicable, your oral antidiabetics will be resumed when you are tolerating a solid diet.   -Your final pathology results from surgery should be available around one  week after surgery and the results will be relayed to you when available.   -Dr. Lahoma Crocker is the surgeon that assists your GYN Oncologist with surgery.  If you end up staying the night, the next day after your surgery you will either see Dr. Berline Lopes, Dr. Ernestina Patches, or Dr. Lahoma Crocker.   -FMLA forms can be faxed to [phone] and please allow 5-7 business days for completion.   Pain Management After Surgery -We will reach out to your chronic pain specialist to notify them of your upcoming surgery.     -Make sure that you have Tylenol IF YOU ARE ABLE TO TAKE THESE MEDICATION at home to use on a regular basis after surgery for pain control.    -Review the attached handout on narcotic use and their risks and side effects.    Bowel Regimen -It is important to prevent constipation and drink adequate amounts of liquids. You may be prescribed medication to prevent constipation after surgery.   Risks of Surgery Risks of surgery are low but include bleeding, infection, damage to surrounding structures, re-operation, blood clots, and very rarely death.     Blood Transfusion Information (For the consent to be signed before surgery)   We will be checking your blood type before surgery so in case of emergencies, we will know what type of blood you would need.                                             WHAT IS A BLOOD TRANSFUSION?   A transfusion is the replacement of blood or some of its parts. Blood is made up of multiple cells which provide different functions. Red blood cells carry oxygen and are used for blood loss replacement. White blood cells fight against infection. Platelets control bleeding. Plasma helps clot blood. Other blood products are available for specialized needs, such as hemophilia or other clotting disorders. BEFORE THE TRANSFUSION  Who gives blood for transfusions?  You may be able to donate blood to be used at a later date on yourself (autologous donation). Relatives can be asked to donate blood. This is generally not any safer than if you have received blood from a stranger. The same precautions are taken to ensure safety when a relative's blood is donated. Healthy volunteers who are fully evaluated to make sure their blood is safe. This is blood bank blood. Transfusion therapy is the safest it has ever been in the practice of medicine. Before blood is taken from a donor, a complete history is taken to make sure that person has no history of diseases nor engages in risky  social behavior (examples are intravenous drug use or sexual activity with multiple partners). The donor's travel history is screened to minimize risk of transmitting infections, such as malaria. The donated blood is tested for signs of infectious diseases, such as HIV and hepatitis. The blood is then tested to be sure it is compatible with you in order to minimize the chance of a transfusion reaction. If you or a relative donates blood, this is often done in anticipation of surgery and is not appropriate for emergency situations. It takes many days to process the donated blood. RISKS AND COMPLICATIONS Although transfusion therapy is very safe and saves many lives, the main dangers of transfusion include:  Getting an infectious disease. Developing a transfusion reaction. This is an allergic reaction  to something in the blood you were given. Every precaution is taken to prevent this. The decision to have a blood transfusion has been considered carefully by your caregiver before blood is given. Blood is not given unless the benefits outweigh the risks.   AFTER SURGERY INSTRUCTIONS   Return to work: 4-6 weeks if applicable   You may have a white honeycomb dressing over your larger incision. This dressing can be removed 5 days after surgery and you do not need to reapply a new dressing. Once you remove the dressing, you will notice that you have the surgical glue (dermabond) on the incision and this will peel off on its own. You can get this dressing wet in the shower the days after surgery prior to removal on the 5th day.    Activity: 1. Be up and out of the bed during the day.  Take a nap if needed.  You may walk up steps but be careful and use the hand rail.  Stair climbing will tire you more than you think, you may need to stop part way and rest.    2. No lifting or straining for 6 weeks over 10 pounds. No pushing, pulling, straining for 6 weeks.   3. No driving for around [id number] days.  Do not drive  if you are taking narcotic pain medicine and make sure that your reaction time has returned.    4. You can shower as soon as the next day after surgery. Shower daily.  Use your regular soap and water (not directly on the incision) and pat your incision(s) dry afterwards; don't rub.  No tub baths or submerging your body in water until cleared by your surgeon. If you have the soap that was given to you by pre-surgical testing that was used before surgery, you do not need to use it afterwards because this can irritate your incisions.    5. No sexual activity and nothing in the vagina for 10-12 weeks.   6. You may experience a small amount of clear drainage from your incisions, which is normal.  If the drainage persists, increases, or changes color please call the office.   7. Do not use creams, lotions, or ointments such as neosporin on your incisions after surgery until advised by your surgeon because they can cause removal of the dermabond glue on your incisions.     8. You may experience vaginal spotting after surgery or around the 6-8 week mark from surgery when the stitches at the top of the vagina begin to dissolve.  The spotting is normal but if you experience heavy bleeding, call our office.   9. Take Tylenol first for pain if you are able to take these medications and only use narcotic pain medication for severe pain not relieved by the Tylenol.  Monitor your Tylenol intake to a max of 4,000 mg in a 24 hour period.    Diet: 1. Low sodium Heart Healthy Diet is recommended but you are cleared to resume your normal (before surgery) diet after your procedure.   2. It is important to keep bowel movements regular and to prevent constipation.     Wound Care: 1. Keep clean and dry.  Shower daily.   Reasons to call the Doctor: Fever - Oral temperature greater than 100.4 degrees Fahrenheit Foul-smelling vaginal discharge Difficulty urinating Nausea and vomiting Increased pain at the site of  the incision that is unrelieved with pain medicine. Difficulty breathing with or without chest pain New calf  pain especially if only on one side Sudden, continuing increased vaginal bleeding with or without clots.   Contacts: For questions or concerns you should contact:   Dr. Jeral Pinch at [phone]   Joylene John, NP at [phone]   After Hours: call [phone] and have the GYN Oncologist paged/contacted (after 5 pm or on the weekends). You will speak with an after hours RN and let he or she know you have had surgery.   Messages sent via mychart are for non-urgent matters and are not responded to after hours so for urgent needs, please call the after hours number.     COLON BOWEL PREP WITH JUST ANTIBIOTICS ONLY     FIVE DAYS PRIOR TO YOUR SURGERY   Obtain supplies for the bowel prep at a pharmacy of your choice: Office sent prescription for your antibiotic pill (Flagyl)    Improve nutrition: Consider drinking 2-3 nutritional shakes (Ex: Ensure Surgery) every day, starting 5 days prior to surgery     DAY PRIOR TO SURGERY    Switch to a full liquid diet the day before surgery Drink plenty of liquids all day to avoid getting dehydrated     2:00pm Take 2 Flagyl (total of 1000 mg) tablets   3:00pm Take 2 Flagyl (total of 1000 mg) tablets Drink plenty of clear liquids all evening to avoid getting dehydrated   10:00pm Take 2 Flagyl (total of 1000 mg) tablets Do not eat anything solid after bedtime (midnight) the night before your surgery.   BUT DO drink plenty of clear liquids (Water, Gatorade, juice, soda, coffee, tea, broths, etc.) up to 3 hours prior to surgery to avoid getting dehydrated.      MORNING OF SURGERY   Remember to not to eat anything solid that morning  Stop drinking liquids before you leave the house (>3 hours prior to surgery)   WHAT DO I NEED TO KNOW ABOUT A FULL LIQUID DIET? -You may have any liquid. -You may have any food that  becomes a liquid at room temperature. The food is considered a liquid if it can be poured off a spoon at room temperature. WHAT FOODS CAN I EAT? -Grains: Any grain food that can be pureed in soup (such as crackers, pasta, and rice). Hot cereal (such as farina or oatmeal) that has been blended.  -Fruits: Fruit juice, including nectars and juices. -Meats and Other Protein Sources: Eggs in custard, eggnog mix, and eggs used in ice cream or pudding. Strained meats, like in baby food, may be allowed. Consult your health care provider.  -Dairy: Milk and milk-based beverages, including milk shakes and instant breakfast mixes. Smooth yogurt. Pureed cottage cheese. Avoid these foods if they are not well tolerated. -Beverages: All beverages, including liquid nutritional supplements. AVOID CARBONATED BEVERAGES. -Condiments: Iodized salt, pepper, spices, and flavorings. Cocoa powder. Vinegar, ketchup, yellow mustard, smooth sauces (such as hollandaise, cheese sauce, or white sauce), and soy sauce. -Sweets and Desserts: Custard, smooth pudding. Flavored gelatin. Tapioca, junket. Plain ice cream, sherbet, fruit ices. Frozen ice pops, frozen fudge pops, pudding pops, and other frozen bars with cream. Syrups, including chocolate syrup. Sugar, honey, jelly.  -Fats and Oils: Margarine, butter, cream, sour cream, and oils. -Other: Broth and cream soups. Strained, broth-based soups. The items listed above may not be a complete list of recommended foods or beverages.  WHAT FOODS CAN I NOT EAT? Grains: All breads. Grains are not allowed unless they are pureed into soup. Vegetables: No raw vegetables. Vegetables are not  allowed unless they are juiced, or cooked and pureed into soup. Fruits: Fruits are not allowed unless they are juiced. Meats and Other Protein Sources: Any meat or fish. Cooked or raw eggs. Nut butters.  Dairy: Cheese.  Condiments: Stone ground mustards. Fats and Oils: Fats that are coarse or  chunky. Sweets and Desserts: Ice cream or other frozen desserts that have any solids in them or on top, such as nuts, chocolate chips, and pieces of cookies. Cakes. Cookies. Candy. Others: Soups with chunks or pieces in them.

## 2022-08-07 ENCOUNTER — Telehealth: Payer: Self-pay | Admitting: Oncology

## 2022-08-07 ENCOUNTER — Telehealth: Payer: Self-pay | Admitting: *Deleted

## 2022-08-07 DIAGNOSIS — M62838 Other muscle spasm: Secondary | ICD-10-CM | POA: Diagnosis not present

## 2022-08-07 DIAGNOSIS — M792 Neuralgia and neuritis, unspecified: Secondary | ICD-10-CM | POA: Diagnosis not present

## 2022-08-07 DIAGNOSIS — G35 Multiple sclerosis: Secondary | ICD-10-CM | POA: Diagnosis not present

## 2022-08-07 MED FILL — Dexamethasone Sodium Phosphate Inj 100 MG/10ML: INTRAMUSCULAR | Qty: 1 | Status: AC

## 2022-08-07 MED FILL — Fosaprepitant Dimeglumine For IV Infusion 150 MG (Base Eq): INTRAVENOUS | Qty: 5 | Status: AC

## 2022-08-07 MED FILL — Fosaprepitant Dimeglumine For IV Infusion 150 MG (Base Eq): INTRAVENOUS | Qty: 5 | Status: CN

## 2022-08-07 NOTE — Telephone Encounter (Signed)
Per Dr Tucker fax records and surgical optimization form to the patient's PCP  

## 2022-08-07 NOTE — Telephone Encounter (Signed)
LM as noted below by Joylene John, NP for Clinical assistant Mickel Baas to call back and advice [place] as to how to proceed with Pain management around surgery 08-16-22 for Ms Kolis.

## 2022-08-07 NOTE — Telephone Encounter (Signed)
Called [name] and advised her that we are canceling her appointments for port flush/lab, follow up with Dr. Alvy Bimler and infusion on 08/08/22 since she is scheduled for surgery on 4/10.  She verbalized understanding and agreement.  She also said she talked to her neurologist - Etta Grandchild PA-C about her pain contract.  Etta Grandchild said she is ok with whatever Dr. Berline Lopes "sees fit" for pain medication needs after surgery.

## 2022-08-08 ENCOUNTER — Other Ambulatory Visit: Payer: Self-pay

## 2022-08-08 ENCOUNTER — Inpatient Hospital Stay: Payer: 59

## 2022-08-08 ENCOUNTER — Inpatient Hospital Stay: Payer: 59 | Admitting: Hematology and Oncology

## 2022-08-08 NOTE — Telephone Encounter (Signed)
Clinical assistant Mickel Baas from Eleanora Neighbor, MD office called back and said "Per Etta Grandchild PA-C Dr Berline Lopes can manage the breakthrough pain. Patient is currently prescribed oxycodone 20 mgs up to four times a day." Message forwarded to [facility] APP and Dr Berline Lopes

## 2022-08-09 ENCOUNTER — Encounter (HOSPITAL_COMMUNITY)
Admission: RE | Admit: 2022-08-09 | Discharge: 2022-08-09 | Disposition: A | Discharge status: Home / Self Care | Payer: 59 | Source: Ambulatory Visit | Attending: Gynecologic Oncology | Admitting: Gynecologic Oncology

## 2022-08-09 ENCOUNTER — Encounter (HOSPITAL_COMMUNITY): Payer: Self-pay

## 2022-08-09 ENCOUNTER — Other Ambulatory Visit: Payer: Self-pay

## 2022-08-09 ENCOUNTER — Encounter: Payer: Self-pay | Admitting: Hematology and Oncology

## 2022-08-09 VITALS — BP 125/75 | HR 101 | Temp 99.0°F | Ht 69.0 in | Wt 177.0 lb

## 2022-08-09 DIAGNOSIS — Z01812 Encounter for preprocedural laboratory examination: Secondary | ICD-10-CM | POA: Diagnosis not present

## 2022-08-09 DIAGNOSIS — C563 Malignant neoplasm of bilateral ovaries: Secondary | ICD-10-CM | POA: Diagnosis not present

## 2022-08-09 DIAGNOSIS — Z01818 Encounter for other preprocedural examination: Secondary | ICD-10-CM

## 2022-08-09 DIAGNOSIS — R7303 Prediabetes: Secondary | ICD-10-CM | POA: Diagnosis not present

## 2022-08-09 HISTORY — DX: Prediabetes: R73.03

## 2022-08-09 HISTORY — DX: Cardiac murmur, unspecified: R01.1

## 2022-08-09 HISTORY — DX: Multiple sclerosis, unspecified: G35.D

## 2022-08-09 HISTORY — DX: Multiple sclerosis: G35

## 2022-08-09 LAB — COMPREHENSIVE METABOLIC PANEL
ALT: 31 U/L (ref 0–44)
AST: 23 U/L (ref 15–41)
Albumin: 4.3 g/dL (ref 3.5–5.0)
Alkaline Phosphatase: 73 U/L (ref 38–126)
Anion gap: 8 (ref 5–15)
BUN: 19 mg/dL (ref 8–23)
CO2: 26 mmol/L (ref 22–32)
Calcium: 9.2 mg/dL (ref 8.9–10.3)
Chloride: 105 mmol/L (ref 98–111)
Creatinine, Ser: 0.9 mg/dL (ref 0.44–1.00)
GFR, Estimated: >60 mL/min (ref >60)
Glucose, Bld: 105 mg/dL — ABNORMAL HIGH (ref 70–99)
Potassium: 3.5 mmol/L (ref 3.5–5.1)
Sodium: 139 mmol/L (ref 135–145)
Total Bilirubin: 0.5 mg/dL (ref 0.3–1.2)
Total Protein: 7.5 g/dL (ref 6.5–8.1)

## 2022-08-09 LAB — CBC WITH DIFFERENTIAL/PLATELET
Abs Immature Granulocytes: 0.02 10*3/uL (ref 0.00–0.07)
Basophils Absolute: 0.1 10*3/uL (ref 0.0–0.1)
Basophils Relative: 1 %
Eosinophils Absolute: 0.1 10*3/uL (ref 0.0–0.5)
Eosinophils Relative: 2 %
HCT: 38.6 % (ref 36.0–46.0)
Hemoglobin: 13.1 g/dL (ref 12.0–15.0)
Immature Granulocytes: 0 %
Lymphocytes Relative: 39 %
Lymphs Abs: 2.1 10*3/uL (ref 0.7–4.0)
MCH: 32.6 pg (ref 26.0–34.0)
MCHC: 33.9 g/dL (ref 30.0–36.0)
MCV: 96 fL (ref 80.0–100.0)
Monocytes Absolute: 0.9 10*3/uL (ref 0.1–1.0)
Monocytes Relative: 17 %
Neutro Abs: 2.2 10*3/uL (ref 1.7–7.7)
Neutrophils Relative %: 41 %
Platelets: 178 10*3/uL (ref 150–400)
RBC: 4.02 MIL/uL (ref 3.87–5.11)
RDW: 14.6 % (ref 11.5–15.5)
WBC: 5.3 10*3/uL (ref 4.0–10.5)
nRBC: 0 % (ref 0.0–0.2)

## 2022-08-09 LAB — ABO/RH: ABO/RH(D): O POS

## 2022-08-09 LAB — GLUCOSE, CAPILLARY: Glucose-Capillary: 126 mg/dL — ABNORMAL HIGH (ref 70–99)

## 2022-08-09 NOTE — Progress Notes (Signed)
Pharmacy was called during PST appointment,unable to get in contact with them at that time.The pharmacy number was provided to pt. For the med rec.

## 2022-08-09 NOTE — Progress Notes (Addendum)
For Short Stay: COVID SWAB appointment date:  Bowel Prep reminder:   For Anesthesia: PCP - Dr. Johny Blamer Cardiologist - N/A. Dr. Antoine Poche: LOV: 12/13/17 CT chest:04/30/22 Chest x-ray -  EKG -  Stress Test - 11/06/17 ECHO -  Cardiac Cath -  Pacemaker/ICD device last checked: Pacemaker orders received: Device Rep notified:  Spinal Cord Stimulator:  Sleep Study -  CPAP -   Fasting Blood Sugar -  Checks Blood Sugar _____ times a day Date and result of last Hgb A1c-  Last dose of GLP1 agonist-  GLP1 instructions:   Last dose of SGLT-2 inhibitors-  SGLT-2 instructions:   Blood Thinner Instructions: Eliquis will be held 3 days before surgery: Dr. Pricilla Holm. Aspirin Instructions: N/A Last Dose:  Activity level: Can go up a flight of stairs and activities of daily living without stopping and without chest pain and/or shortness of breath   Able to exercise without chest pain and/or shortness of breath  Anesthesia review: HTN,Pre-DIA.  Patient denies shortness of breath, fever, cough and chest pain at PAT appointment   Patient verbalized understanding of instructions that were given to them at the PAT appointment. Patient was also instructed that they will need to review over the PAT instructions again at home before surgery.

## 2022-08-09 NOTE — Patient Instructions (Signed)
DUE TO COVID-19 ONLY TWO VISITORS  (aged 65 and older)  ARE ALLOWED TO COME WITH YOU AND STAY IN THE WAITING ROOM ONLY DURING PRE OP AND PROCEDURE.   **NO VISITORS ARE ALLOWED IN THE SHORT STAY AREA OR RECOVERY ROOM!!**  IF YOU WILL BE ADMITTED INTO THE HOSPITAL YOU ARE ALLOWED ONLY FOUR SUPPORT PEOPLE DURING VISITATION HOURS ONLY (7 AM -8PM)   The support person(s) must pass our screening, gel in and out, and wear a mask at all times, including in the patient's room. Patients must also wear a mask when staff or their support person are in the room. Visitors GUEST BADGE MUST BE WORN VISIBLY  One adult visitor may remain with you overnight and MUST be in the room by 8 P.M.     Your procedure is scheduled on: 08/16/22   Report to [facility] Main Entrance    Report to admitting at : 9:45 AM   Call this number if you have problems the morning of surgery [phone]   Full liquid diet the day before surgery Water. Fruit juices, including nectars and juices with pulp. Butter, margarine, oil, cream, custard, and pudding. Plain ice cream, frozen yogurt, and sherbet. Fruit ices and popsicles. Sugar, honey, and syrups. Soup broth (bouillon, consomm, and strained cream soups, but no solids)   After Midnight you may have the following Clear liquids until : 9:00 AM DAY OF SURGERY  Water Black Coffee (sugar ok, NO MILK/CREAM OR CREAMERS)  Tea (sugar ok, NO MILK/CREAM OR CREAMERS) regular and decaf                             Plain Jell-O (NO RED)                                           Fruit ices (not with fruit pulp, NO RED)                                     Popsicles (NO RED)                                                                  Juice: apple, WHITE grape, WHITE cranberry Sports drinks like Gatorade (NO RED)              FOLLOW BOWEL PREP AND ANY ADDITIONAL PRE OP INSTRUCTIONS YOU RECEIVED FROM YOUR SURGEON'S OFFICE!!!   Oral Hygiene is also important to reduce  your risk of infection.                                    Remember - BRUSH YOUR TEETH THE MORNING OF SURGERY WITH YOUR REGULAR TOOTHPASTE  DENTURES WILL BE REMOVED PRIOR TO SURGERY PLEASE DO NOT APPLY "Poly grip" OR ADHESIVES!!!   Do NOT smoke after Midnight   Take these medicines the morning of surgery with A SIP OF WATER: lorazepam,flagyl.Use inhalers as usual.Omeprazole,oxycodone as needed.  You may not have any metal on your body including hair pins, jewelry, and body piercing             Do not wear make-up, lotions, powders, perfumes/cologne, or deodorant  Do not wear nail polish including gel and S&S, artificial/acrylic nails, or any other type of covering on natural nails including finger and toenails. If you have artificial nails, gel coating, etc. that needs to be removed by a nail salon please have this removed prior to surgery or surgery may need to be canceled/ delayed if the surgeon/ anesthesia feels like they are unable to be safely monitored.   Do not shave  48 hours prior to surgery.    Do not bring valuables to the hospital. [place].   Contacts, glasses, or bridgework may not be worn into surgery.   Bring small overnight bag day of surgery.   DO NOT [place]. PHARMACY WILL DISPENSE MEDICATIONS LISTED ON YOUR MEDICATION LIST TO YOU DURING YOUR ADMISSION [place]!    Patients discharged on the day of surgery will not be allowed to drive home.  Someone NEEDS to stay with you for the first 24 hours after anesthesia.   Special Instructions: Bring a copy of your healthcare power of attorney and living will documents         the day of surgery if you haven't scanned them before.              Please read over the following fact sheets you were given: IF YOU HAVE QUESTIONS ABOUT YOUR PRE-OP INSTRUCTIONS PLEASE CALL [phone]    [facility] - Preparing  for Surgery Before surgery, you can play an important role.  Because skin is not sterile, your skin needs to be as free of germs as possible.  You can reduce the number of germs on your skin by washing with CHG (chlorahexidine gluconate) soap before surgery.  CHG is an antiseptic cleaner which kills germs and bonds with the skin to continue killing germs even after washing. Please DO NOT use if you have an allergy to CHG or antibacterial soaps.  If your skin becomes reddened/irritated stop using the CHG and inform your nurse when you arrive at Short Stay. Do not shave (including legs and underarms) for at least 48 hours prior to the first CHG shower.  You may shave your face/neck. Please follow these instructions carefully:  1.  Shower with CHG Soap the night before surgery and the  morning of Surgery.  2.  If you choose to wash your hair, wash your hair first as usual with your  normal  shampoo.  3.  After you shampoo, rinse your hair and body thoroughly to remove the  shampoo.                           4.  Use CHG as you would any other liquid soap.  You can apply chg directly  to the skin and wash                       Gently with a scrungie or clean washcloth.  5.  Apply the CHG Soap to your body ONLY FROM THE NECK DOWN.   Do not use on face/ open  Wound or open sores. Avoid contact with eyes, ears mouth and genitals (private parts).                       Wash face,  Genitals (private parts) with your normal soap.             6.  Wash thoroughly, paying special attention to the area where your surgery  will be performed.  7.  Thoroughly rinse your body with warm water from the neck down.  8.  DO NOT shower/wash with your normal soap after using and rinsing off  the CHG Soap.                9.  Pat yourself dry with a clean towel.            10.  Wear clean pajamas.            11.  Place clean sheets on your bed the night of your first shower and do not  sleep with  pets. Day of Surgery : Do not apply any lotions/deodorants the morning of surgery.  Please wear clean clothes to the hospital/surgery center.  FAILURE TO FOLLOW THESE INSTRUCTIONS MAY RESULT IN THE CANCELLATION OF YOUR SURGERY PATIENT SIGNATURE_________________________________  NURSE SIGNATURE__________________________________  ________________________________________________________________________ WHAT IS A BLOOD TRANSFUSION? Blood Transfusion Information  A transfusion is the replacement of blood or some of its parts. Blood is made up of multiple cells which provide different functions. Red blood cells carry oxygen and are used for blood loss replacement. White blood cells fight against infection. Platelets control bleeding. Plasma helps clot blood. Other blood products are available for specialized needs, such as hemophilia or other clotting disorders. BEFORE THE TRANSFUSION  Who gives blood for transfusions?  Healthy volunteers who are fully evaluated to make sure their blood is safe. This is blood bank blood. Transfusion therapy is the safest it has ever been in the practice of medicine. Before blood is taken from a donor, a complete history is taken to make sure that person has no history of diseases nor engages in risky social behavior (examples are intravenous drug use or sexual activity with multiple partners). The donor's travel history is screened to minimize risk of transmitting infections, such as malaria. The donated blood is tested for signs of infectious diseases, such as HIV and hepatitis. The blood is then tested to be sure it is compatible with you in order to minimize the chance of a transfusion reaction. If you or a relative donates blood, this is often done in anticipation of surgery and is not appropriate for emergency situations. It takes many days to process the donated blood. RISKS AND COMPLICATIONS Although transfusion therapy is very safe and saves many lives, the  main dangers of transfusion include:  Getting an infectious disease. Developing a transfusion reaction. This is an allergic reaction to something in the blood you were given. Every precaution is taken to prevent this. The decision to have a blood transfusion has been considered carefully by your caregiver before blood is given. Blood is not given unless the benefits outweigh the risks. AFTER THE TRANSFUSION Right after receiving a blood transfusion, you will usually feel much better and more energetic. This is especially true if your red blood cells have gotten low (anemic). The transfusion raises the level of the red blood cells which carry oxygen, and this usually causes an energy increase. The nurse administering the transfusion will monitor you carefully for complications.  HOME CARE INSTRUCTIONS  No special instructions are needed after a transfusion. You may find your energy is better. Speak with your caregiver about any limitations on activity for underlying diseases you may have. SEEK MEDICAL CARE IF:  Your condition is not improving after your transfusion. You develop redness or irritation at the intravenous (IV) site. SEEK IMMEDIATE MEDICAL CARE IF:  Any of the following symptoms occur over the next 12 hours: Shaking chills. You have a temperature by mouth above 102 F (38.9 C), not controlled by medicine. Chest, back, or muscle pain. People around you feel you are not acting correctly or are confused. Shortness of breath or difficulty breathing. Dizziness and fainting. You get a rash or develop hives. You have a decrease in urine output. Your urine turns a dark color or changes to pink, red, or brown. Any of the following symptoms occur over the next 10 days: You have a temperature by mouth above 102 F (38.9 C), not controlled by medicine. Shortness of breath. Weakness after normal activity. The white part of the eye turns yellow (jaundice). You have a decrease in the amount  of urine or are urinating less often. Your urine turns a dark color or changes to pink, red, or brown. Document Released: 04/21/2000 Document Revised: 07/17/2011 Document Reviewed: 12/09/2007 [facility] Patient Information 2014 [place], [place].  _______________________________________________________________________

## 2022-08-10 ENCOUNTER — Encounter: Payer: Self-pay | Admitting: Hematology and Oncology

## 2022-08-10 LAB — HEMOGLOBIN A1C
Hgb A1c MFr Bld: 6 % — ABNORMAL HIGH (ref 4.8–5.6)
Mean Plasma Glucose: 126 mg/dL

## 2022-08-10 NOTE — Telephone Encounter (Signed)
Received surgical optimization form

## 2022-08-10 NOTE — Telephone Encounter (Signed)
LMOM at Dr Kenton Kingfisher for the [place] to call the office back regarding the surgical optimization form

## 2022-08-11 LAB — CA 125: Cancer Antigen (CA) 125: 36.8 U/mL (ref 0.0–38.1)

## 2022-08-15 ENCOUNTER — Telehealth: Payer: Self-pay

## 2022-08-15 NOTE — Telephone Encounter (Signed)
Telephone call to check on pre-operative status.  Patient compliant with pre-operative instructions.  Reinforced nothing to eat after midnight. Clear liquids until 0845. Patient to arrive at 0945.  No questions or concerns voiced.  Instructed to call for any needs.

## 2022-08-15 NOTE — Telephone Encounter (Addendum)
I spoke to [place] CMA for Yolanda Clas PA-C. She states recommendation for pain management for post op pain is to keep pt on the Oxycodone regimen they currently have her on.  Pt is aware and voiced an understanding Yolanda Mccreedy NP aware

## 2022-08-15 NOTE — Anesthesia Preprocedure Evaluation (Signed)
Anesthesia Evaluation  Patient identified by MRN, date of birth, ID band Patient awake    Reviewed: Allergy & Precautions, NPO status , Patient's Chart, lab work & pertinent test results  Airway Mallampati: II  TM Distance: >3 FB Neck ROM: Full    Dental no notable dental hx. (+) Partial Lower   Pulmonary asthma , Current SmokerPatient did not abstain from smoking.   Pulmonary exam normal breath sounds clear to auscultation       Cardiovascular hypertension, Normal cardiovascular exam Rhythm:Regular Rate:Normal     Neuro/Psych  PSYCHIATRIC DISORDERS Anxiety      Neuromuscular disease    GI/Hepatic ,GERD  Medicated and Controlled,,(+) Hepatitis -  Endo/Other  negative endocrine ROS    Renal/GU Lab Results      Component                Value               Date                      CREATININE               0.90                08/09/2022                  K                        3.5                 08/09/2022                 L adnexal mass    Musculoskeletal  (+) Arthritis ,    Abdominal   Peds  Hematology Lab Results      Component                Value               Date                      WBC                      5.3                 08/09/2022                HGB                      13.1                08/09/2022                HCT                      38.6                08/09/2022                     PLT                      178                 08/09/2022              Anesthesia Other Findings All: cymbalta,  cyclobenzaprine, celexa, tramadol, opana, gabapentin, fluticasone  Reproductive/Obstetrics                             Anesthesia Physical Anesthesia Plan  ASA: 3  Anesthesia Plan: General   Post-op Pain Management: Ketamine IV*, Tylenol PO (pre-op)* and Lidocaine infusion*   Induction: Intravenous  PONV Risk Score and Plan: 3 and Treatment may vary due to age or medical  condition, Midazolam, Dexamethasone and Ondansetron  Airway Management Planned: Oral ETT  Additional Equipment: None  Intra-op Plan:   Post-operative Plan: Extubation in OR  Informed Consent: I have reviewed the patients History and Physical, chart, labs and discussed the procedure including the risks, benefits and alternatives for the proposed anesthesia with the patient or authorized representative who has indicated his/her understanding and acceptance.     Dental advisory given  Plan Discussed with: CRNA, Surgeon and Anesthesiologist  Anesthesia Plan Comments:        Anesthesia Quick Evaluation

## 2022-08-15 NOTE — Telephone Encounter (Signed)
Attempted to reach patient to check in with her pre-operatively. Unable to reach patient. Left message requesting return call.   ?

## 2022-08-16 ENCOUNTER — Inpatient Hospital Stay (HOSPITAL_COMMUNITY): Payer: 59 | Admitting: Anesthesiology

## 2022-08-16 ENCOUNTER — Encounter (HOSPITAL_COMMUNITY)
Admission: RE | Disposition: A | Discharge status: Home / Self Care | Payer: Self-pay | Source: Home / Self Care | Attending: Gynecologic Oncology

## 2022-08-16 ENCOUNTER — Other Ambulatory Visit: Payer: Self-pay

## 2022-08-16 ENCOUNTER — Inpatient Hospital Stay (HOSPITAL_COMMUNITY)
Admission: RE | Admit: 2022-08-16 | Discharge: 2022-08-22 | DRG: 737 | Disposition: A | Discharge status: Home / Self Care | Payer: 59 | Attending: Gynecologic Oncology | Admitting: Gynecologic Oncology

## 2022-08-16 DIAGNOSIS — Z803 Family history of malignant neoplasm of breast: Secondary | ICD-10-CM | POA: Diagnosis not present

## 2022-08-16 DIAGNOSIS — D62 Acute posthemorrhagic anemia: Secondary | ICD-10-CM | POA: Diagnosis not present

## 2022-08-16 DIAGNOSIS — Z79899 Other long term (current) drug therapy: Secondary | ICD-10-CM

## 2022-08-16 DIAGNOSIS — C181 Malignant neoplasm of appendix: Secondary | ICD-10-CM | POA: Diagnosis not present

## 2022-08-16 DIAGNOSIS — C563 Malignant neoplasm of bilateral ovaries: Secondary | ICD-10-CM | POA: Diagnosis present

## 2022-08-16 DIAGNOSIS — I10 Essential (primary) hypertension: Secondary | ICD-10-CM | POA: Diagnosis not present

## 2022-08-16 DIAGNOSIS — G8929 Other chronic pain: Secondary | ICD-10-CM | POA: Diagnosis not present

## 2022-08-16 DIAGNOSIS — Z7901 Long term (current) use of anticoagulants: Secondary | ICD-10-CM | POA: Diagnosis not present

## 2022-08-16 DIAGNOSIS — Z87891 Personal history of nicotine dependence: Secondary | ICD-10-CM | POA: Diagnosis not present

## 2022-08-16 DIAGNOSIS — Z981 Arthrodesis status: Secondary | ICD-10-CM | POA: Diagnosis not present

## 2022-08-16 DIAGNOSIS — Z820 Family history of epilepsy and other diseases of the nervous system: Secondary | ICD-10-CM | POA: Diagnosis not present

## 2022-08-16 DIAGNOSIS — C786 Secondary malignant neoplasm of retroperitoneum and peritoneum: Secondary | ICD-10-CM | POA: Diagnosis not present

## 2022-08-16 DIAGNOSIS — Z79891 Long term (current) use of opiate analgesic: Secondary | ICD-10-CM

## 2022-08-16 DIAGNOSIS — M1990 Unspecified osteoarthritis, unspecified site: Secondary | ICD-10-CM | POA: Diagnosis present

## 2022-08-16 DIAGNOSIS — J45909 Unspecified asthma, uncomplicated: Secondary | ICD-10-CM | POA: Diagnosis not present

## 2022-08-16 DIAGNOSIS — C5701 Malignant neoplasm of right fallopian tube: Secondary | ICD-10-CM | POA: Diagnosis present

## 2022-08-16 DIAGNOSIS — C579 Malignant neoplasm of female genital organ, unspecified: Secondary | ICD-10-CM

## 2022-08-16 DIAGNOSIS — M79601 Pain in right arm: Secondary | ICD-10-CM | POA: Diagnosis not present

## 2022-08-16 DIAGNOSIS — Z8249 Family history of ischemic heart disease and other diseases of the circulatory system: Secondary | ICD-10-CM

## 2022-08-16 DIAGNOSIS — F17200 Nicotine dependence, unspecified, uncomplicated: Secondary | ICD-10-CM | POA: Diagnosis not present

## 2022-08-16 DIAGNOSIS — C784 Secondary malignant neoplasm of small intestine: Secondary | ICD-10-CM | POA: Diagnosis not present

## 2022-08-16 DIAGNOSIS — F17210 Nicotine dependence, cigarettes, uncomplicated: Secondary | ICD-10-CM | POA: Diagnosis not present

## 2022-08-16 DIAGNOSIS — D259 Leiomyoma of uterus, unspecified: Secondary | ICD-10-CM | POA: Diagnosis present

## 2022-08-16 DIAGNOSIS — Z8619 Personal history of other infectious and parasitic diseases: Secondary | ICD-10-CM

## 2022-08-16 DIAGNOSIS — C785 Secondary malignant neoplasm of large intestine and rectum: Secondary | ICD-10-CM | POA: Diagnosis present

## 2022-08-16 DIAGNOSIS — R7303 Prediabetes: Secondary | ICD-10-CM | POA: Diagnosis not present

## 2022-08-16 DIAGNOSIS — F419 Anxiety disorder, unspecified: Secondary | ICD-10-CM

## 2022-08-16 DIAGNOSIS — K5909 Other constipation: Secondary | ICD-10-CM

## 2022-08-16 DIAGNOSIS — R000 Tachycardia, unspecified: Secondary | ICD-10-CM

## 2022-08-16 DIAGNOSIS — M069 Rheumatoid arthritis, unspecified: Secondary | ICD-10-CM | POA: Diagnosis not present

## 2022-08-16 DIAGNOSIS — C19 Malignant neoplasm of rectosigmoid junction: Secondary | ICD-10-CM | POA: Diagnosis not present

## 2022-08-16 DIAGNOSIS — K219 Gastro-esophageal reflux disease without esophagitis: Secondary | ICD-10-CM | POA: Diagnosis not present

## 2022-08-16 DIAGNOSIS — R141 Gas pain: Secondary | ICD-10-CM

## 2022-08-16 HISTORY — PX: COLON RESECTION SIGMOID: SHX6737

## 2022-08-16 HISTORY — PX: CYSTOSCOPY: SHX5120

## 2022-08-16 HISTORY — PX: LAPAROSCOPY: SHX197

## 2022-08-16 HISTORY — PX: HYSTERECTOMY ABDOMINAL WITH SALPINGO-OOPHORECTOMY: SHX6792

## 2022-08-16 LAB — BPAM RBC
Blood Product Expiration Date: 202405092359
Unit Type and Rh: 5100

## 2022-08-16 LAB — BASIC METABOLIC PANEL
Anion gap: 7 (ref 5–15)
BUN: 13 mg/dL (ref 8–23)
CO2: 22 mmol/L (ref 22–32)
Calcium: 7.8 mg/dL — ABNORMAL LOW (ref 8.9–10.3)
Chloride: 107 mmol/L (ref 98–111)
Creatinine, Ser: 0.9 mg/dL (ref 0.44–1.00)
GFR, Estimated: >60 mL/min (ref >60)
Glucose, Bld: 188 mg/dL — ABNORMAL HIGH (ref 70–99)
Potassium: 3.8 mmol/L (ref 3.5–5.1)
Sodium: 136 mmol/L (ref 135–145)

## 2022-08-16 LAB — CBC
HCT: 38.1 % (ref 36.0–46.0)
Hemoglobin: 12.6 g/dL (ref 12.0–15.0)
MCH: 33 pg (ref 26.0–34.0)
MCHC: 33.1 g/dL (ref 30.0–36.0)
MCV: 99.7 fL (ref 80.0–100.0)
Platelets: 113 10*3/uL — ABNORMAL LOW (ref 150–400)
RBC: 3.82 MIL/uL — ABNORMAL LOW (ref 3.87–5.11)
RDW: 15.1 % (ref 11.5–15.5)
WBC: 13.7 10*3/uL — ABNORMAL HIGH (ref 4.0–10.5)
nRBC: 0 % (ref 0.0–0.2)

## 2022-08-16 LAB — POCT I-STAT EG7
Acid-base deficit: 1 mmol/L (ref 0.0–2.0)
Bicarbonate: 22.2 mmol/L (ref 20.0–28.0)
Calcium, Ion: 1.1 mmol/L — ABNORMAL LOW (ref 1.15–1.40)
HCT: 16 % — ABNORMAL LOW (ref 36.0–46.0)
Hemoglobin: 5.4 g/dL — CL (ref 12.0–15.0)
O2 Saturation: 97 %
Potassium: 6.3 mmol/L (ref 3.5–5.1)
Sodium: 137 mmol/L (ref 135–145)
TCO2: 23 mmol/L (ref 22–32)
pCO2, Ven: 30.6 mmHg — ABNORMAL LOW (ref 44–60)
pH, Ven: 7.468 — ABNORMAL HIGH (ref 7.25–7.43)
pO2, Ven: 88 mmHg — ABNORMAL HIGH (ref 32–45)

## 2022-08-16 LAB — GLUCOSE, CAPILLARY: Glucose-Capillary: 151 mg/dL — ABNORMAL HIGH (ref 70–99)

## 2022-08-16 LAB — PREPARE RBC (CROSSMATCH)

## 2022-08-16 SURGERY — LAPAROSCOPY, DIAGNOSTIC
Anesthesia: General

## 2022-08-16 MED ORDER — BUPIVACAINE LIPOSOME 1.3 % IJ SUSP
INTRAMUSCULAR | Status: AC
  Filled 2022-08-16: qty 20

## 2022-08-16 MED ORDER — METRONIDAZOLE 500 MG/100ML IV SOLN
INTRAVENOUS | Status: AC
  Filled 2022-08-16: qty 100

## 2022-08-16 MED ORDER — ACETAMINOPHEN 500 MG PO TABS
1000.0000 mg | ORAL_TABLET | ORAL | Status: AC
  Administered 2022-08-16: 1000 mg via ORAL
  Filled 2022-08-16: qty 2

## 2022-08-16 MED ORDER — PHENYLEPHRINE 80 MCG/ML (10ML) SYRINGE FOR IV PUSH (FOR BLOOD PRESSURE SUPPORT)
PREFILLED_SYRINGE | INTRAVENOUS | Status: AC
  Filled 2022-08-16: qty 20

## 2022-08-16 MED ORDER — PROPOFOL 10 MG/ML IV BOLUS
INTRAVENOUS | Status: AC
  Filled 2022-08-16: qty 20

## 2022-08-16 MED ORDER — HYDROMORPHONE HCL 2 MG/ML IJ SOLN
INTRAMUSCULAR | Status: AC
  Filled 2022-08-16: qty 1

## 2022-08-16 MED ORDER — ONDANSETRON HCL 4 MG/2ML IJ SOLN
INTRAMUSCULAR | Status: DC | PRN
  Administered 2022-08-16: 4 mg via INTRAVENOUS

## 2022-08-16 MED ORDER — SUGAMMADEX SODIUM 500 MG/5ML IV SOLN
INTRAVENOUS | Status: DC | PRN
  Administered 2022-08-16: 400 mg via INTRAVENOUS

## 2022-08-16 MED ORDER — ORAL CARE MOUTH RINSE: 15.0000 mL | Freq: Once | OROMUCOSAL | Status: AC

## 2022-08-16 MED ORDER — LIDOCAINE HCL (CARDIAC) PF 100 MG/5ML IV SOSY
PREFILLED_SYRINGE | INTRAVENOUS | Status: DC | PRN
  Administered 2022-08-16: 100 mg via INTRAVENOUS

## 2022-08-16 MED ORDER — BUPIVACAINE HCL 0.25 % IJ SOLN
INTRAMUSCULAR | Status: AC
  Filled 2022-08-16: qty 1

## 2022-08-16 MED ORDER — CHEWING GUM (ORBIT) SUGAR FREE
1.0000 | CHEWING_GUM | Freq: Three times a day (TID) | ORAL | Status: DC
  Administered 2022-08-16 – 2022-08-22 (×17): 1 via ORAL
  Filled 2022-08-16 (×2): qty 1

## 2022-08-16 MED ORDER — SODIUM CHLORIDE 0.9 % IV SOLN
2.0000 g | INTRAVENOUS | Status: DC
  Filled 2022-08-16: qty 2

## 2022-08-16 MED ORDER — LACTATED RINGERS IV SOLN: INTRAVENOUS | Status: DC | PRN

## 2022-08-16 MED ORDER — PHENYLEPHRINE HCL-NACL 20-0.9 MG/250ML-% IV SOLN
INTRAVENOUS | Status: DC | PRN
  Administered 2022-08-16: 50 ug/min via INTRAVENOUS

## 2022-08-16 MED ORDER — DEXAMETHASONE SODIUM PHOSPHATE 10 MG/ML IJ SOLN
INTRAMUSCULAR | Status: AC
  Filled 2022-08-16: qty 1

## 2022-08-16 MED ORDER — ROCURONIUM BROMIDE 10 MG/ML (PF) SYRINGE
PREFILLED_SYRINGE | INTRAVENOUS | Status: AC
  Filled 2022-08-16: qty 30

## 2022-08-16 MED ORDER — PROPOFOL 10 MG/ML IV BOLUS
INTRAVENOUS | Status: DC | PRN
  Administered 2022-08-16: 150 mg via INTRAVENOUS

## 2022-08-16 MED ORDER — ONDANSETRON HCL 4 MG/2ML IJ SOLN: 4.0000 mg | Freq: Once | INTRAMUSCULAR | Status: DC | PRN

## 2022-08-16 MED ORDER — KETOROLAC TROMETHAMINE 30 MG/ML IJ SOLN
INTRAMUSCULAR | Status: AC
  Filled 2022-08-16: qty 1

## 2022-08-16 MED ORDER — LACTATED RINGERS IV SOLN: INTRAVENOUS | Status: DC

## 2022-08-16 MED ORDER — BUPIVACAINE HCL (PF) 0.25 % IJ SOLN
INTRAMUSCULAR | Status: AC
  Filled 2022-08-16: qty 30

## 2022-08-16 MED ORDER — ALBUTEROL SULFATE HFA 108 (90 BASE) MCG/ACT IN AERS: 2.0000 | INHALATION_SPRAY | Freq: Four times a day (QID) | RESPIRATORY_TRACT | Status: DC | PRN

## 2022-08-16 MED ORDER — SODIUM CHLORIDE 0.9 % IV SOLN: INTRAVENOUS | Status: DC | PRN

## 2022-08-16 MED ORDER — HYDROMORPHONE HCL 1 MG/ML IJ SOLN
INTRAMUSCULAR | Status: AC
  Administered 2022-08-16: 0.5 mg via INTRAVENOUS
  Filled 2022-08-16: qty 1

## 2022-08-16 MED ORDER — EPHEDRINE SULFATE (PRESSORS) 50 MG/ML IJ SOLN
INTRAMUSCULAR | Status: DC | PRN
  Administered 2022-08-16: 10 mg via INTRAVENOUS

## 2022-08-16 MED ORDER — ACETAMINOPHEN 10 MG/ML IV SOLN
1000.0000 mg | Freq: Once | INTRAVENOUS | Status: DC | PRN
  Administered 2022-08-16: 1000 mg via INTRAVENOUS

## 2022-08-16 MED ORDER — BUPIVACAINE LIPOSOME 1.3 % IJ SUSP
INTRAMUSCULAR | Status: DC | PRN
  Administered 2022-08-16: 20 mL

## 2022-08-16 MED ORDER — KETAMINE HCL 10 MG/ML IJ SOLN
INTRAMUSCULAR | Status: DC | PRN
  Administered 2022-08-16: 10 mg via INTRAVENOUS
  Administered 2022-08-16: 20 mg via INTRAVENOUS
  Administered 2022-08-16 (×2): 10 mg via INTRAVENOUS

## 2022-08-16 MED ORDER — DEXMEDETOMIDINE BOLUS VIA INFUSION
0.1500 ug/kg | Freq: Once | INTRAVENOUS | Status: DC
  Filled 2022-08-16: qty 13

## 2022-08-16 MED ORDER — FENTANYL CITRATE (PF) 100 MCG/2ML IJ SOLN
INTRAMUSCULAR | Status: DC | PRN
  Administered 2022-08-16 (×2): 100 ug via INTRAVENOUS
  Administered 2022-08-16: 50 ug via INTRAVENOUS
  Administered 2022-08-16: 100 ug via INTRAVENOUS

## 2022-08-16 MED ORDER — 0.9 % SODIUM CHLORIDE (POUR BTL) OPTIME
TOPICAL | Status: DC | PRN
  Administered 2022-08-16 (×3): 2000 mL
  Administered 2022-08-16: 1000 mL

## 2022-08-16 MED ORDER — METRONIDAZOLE 500 MG/100ML IV SOLN
INTRAVENOUS | Status: DC | PRN
  Administered 2022-08-16: 500 mg via INTRAVENOUS

## 2022-08-16 MED ORDER — OXYCODONE HCL 5 MG/5ML PO SOLN: 5.0000 mg | Freq: Once | ORAL | Status: AC | PRN

## 2022-08-16 MED ORDER — PHENYLEPHRINE HCL (PRESSORS) 10 MG/ML IV SOLN
INTRAVENOUS | Status: AC
  Filled 2022-08-16: qty 1

## 2022-08-16 MED ORDER — LIDOCAINE HCL (PF) 2 % IJ SOLN
INTRAMUSCULAR | Status: AC
  Filled 2022-08-16: qty 20

## 2022-08-16 MED ORDER — ACETAMINOPHEN 500 MG PO TABS
1000.0000 mg | ORAL_TABLET | Freq: Four times a day (QID) | ORAL | Status: DC
  Administered 2022-08-16 – 2022-08-22 (×20): 1000 mg via ORAL
  Filled 2022-08-16 (×21): qty 2

## 2022-08-16 MED ORDER — CHLORHEXIDINE GLUCONATE 0.12 % MT SOLN
15.0000 mL | Freq: Once | OROMUCOSAL | Status: AC
  Administered 2022-08-16: 15 mL via OROMUCOSAL

## 2022-08-16 MED ORDER — LIDOCAINE HCL (PF) 2 % IJ SOLN
INTRAMUSCULAR | Status: DC | PRN
  Administered 2022-08-16: 1.5 mg/kg/h via INTRADERMAL

## 2022-08-16 MED ORDER — PHENYLEPHRINE HCL (PRESSORS) 10 MG/ML IV SOLN
INTRAVENOUS | Status: DC | PRN
  Administered 2022-08-16: 80 ug via INTRAVENOUS
  Administered 2022-08-16: 240 ug via INTRAVENOUS
  Administered 2022-08-16 (×2): 160 ug via INTRAVENOUS
  Administered 2022-08-16: 80 ug via INTRAVENOUS
  Administered 2022-08-16 (×5): 160 ug via INTRAVENOUS
  Administered 2022-08-16: 80 ug via INTRAVENOUS
  Administered 2022-08-16: 160 ug via INTRAVENOUS
  Administered 2022-08-16: 80 ug via INTRAVENOUS

## 2022-08-16 MED ORDER — OXYCODONE HCL 5 MG PO TABS
5.0000 mg | ORAL_TABLET | ORAL | Status: DC | PRN
  Administered 2022-08-16 – 2022-08-17 (×2): 5 mg via ORAL
  Filled 2022-08-16 (×2): qty 1

## 2022-08-16 MED ORDER — EPHEDRINE 5 MG/ML INJ
INTRAVENOUS | Status: AC
  Filled 2022-08-16: qty 5

## 2022-08-16 MED ORDER — SODIUM CHLORIDE 0.9 % IV SOLN: 10.0000 mL/h | Freq: Once | INTRAVENOUS | Status: DC

## 2022-08-16 MED ORDER — ONDANSETRON HCL 4 MG/2ML IJ SOLN
INTRAMUSCULAR | Status: AC
  Filled 2022-08-16: qty 2

## 2022-08-16 MED ORDER — ENOXAPARIN SODIUM 40 MG/0.4ML IJ SOSY
40.0000 mg | PREFILLED_SYRINGE | INTRAMUSCULAR | Status: DC
  Administered 2022-08-17 – 2022-08-22 (×6): 40 mg via SUBCUTANEOUS
  Filled 2022-08-16 (×6): qty 0.4

## 2022-08-16 MED ORDER — NON FORMULARY: 1.0000 [IU] | Freq: Three times a day (TID) | Status: DC

## 2022-08-16 MED ORDER — DEXAMETHASONE SODIUM PHOSPHATE 10 MG/ML IJ SOLN
INTRAMUSCULAR | Status: DC | PRN
  Administered 2022-08-16: 8 mg via INTRAVENOUS

## 2022-08-16 MED ORDER — HYDROMORPHONE HCL 1 MG/ML IJ SOLN
0.2500 mg | INTRAMUSCULAR | Status: DC | PRN
  Administered 2022-08-16 (×3): 0.5 mg via INTRAVENOUS

## 2022-08-16 MED ORDER — PREGABALIN 75 MG PO CAPS
75.0000 mg | ORAL_CAPSULE | Freq: Two times a day (BID) | ORAL | Status: DC
  Administered 2022-08-17 – 2022-08-22 (×11): 75 mg via ORAL
  Filled 2022-08-16 (×11): qty 1

## 2022-08-16 MED ORDER — HEPARIN SODIUM (PORCINE) 5000 UNIT/ML IJ SOLN
5000.0000 [IU] | INTRAMUSCULAR | Status: AC
  Administered 2022-08-16: 5000 [IU] via SUBCUTANEOUS
  Filled 2022-08-16: qty 1

## 2022-08-16 MED ORDER — MIDAZOLAM HCL 5 MG/5ML IJ SOLN
INTRAMUSCULAR | Status: DC | PRN
  Administered 2022-08-16: 2 mg via INTRAVENOUS

## 2022-08-16 MED ORDER — FENTANYL CITRATE (PF) 250 MCG/5ML IJ SOLN
INTRAMUSCULAR | Status: AC
  Filled 2022-08-16: qty 5

## 2022-08-16 MED ORDER — AMISULPRIDE (ANTIEMETIC) 5 MG/2ML IV SOLN: 10.0000 mg | Freq: Once | INTRAVENOUS | Status: DC | PRN

## 2022-08-16 MED ORDER — OXYCODONE HCL 5 MG PO TABS
5.0000 mg | ORAL_TABLET | Freq: Once | ORAL | Status: AC | PRN
  Administered 2022-08-16: 5 mg via ORAL

## 2022-08-16 MED ORDER — ACETAMINOPHEN 10 MG/ML IV SOLN
INTRAVENOUS | Status: AC
  Filled 2022-08-16: qty 100

## 2022-08-16 MED ORDER — ONDANSETRON HCL 4 MG/2ML IJ SOLN: 4.0000 mg | Freq: Four times a day (QID) | INTRAMUSCULAR | Status: DC | PRN

## 2022-08-16 MED ORDER — ONDANSETRON HCL 4 MG PO TABS: 4.0000 mg | ORAL_TABLET | Freq: Four times a day (QID) | ORAL | Status: DC | PRN

## 2022-08-16 MED ORDER — LABETALOL HCL 5 MG/ML IV SOLN
10.0000 mg | INTRAVENOUS | Status: DC | PRN
  Administered 2022-08-16 – 2022-08-18 (×3): 10 mg via INTRAVENOUS
  Filled 2022-08-16 (×4): qty 4

## 2022-08-16 MED ORDER — KETOROLAC TROMETHAMINE 30 MG/ML IJ SOLN
30.0000 mg | Freq: Once | INTRAMUSCULAR | Status: AC
  Administered 2022-08-16: 30 mg via INTRAVENOUS

## 2022-08-16 MED ORDER — KCL IN DEXTROSE-NACL 20-5-0.45 MEQ/L-%-% IV SOLN
INTRAVENOUS | Status: DC
  Filled 2022-08-16 (×5): qty 1000

## 2022-08-16 MED ORDER — SODIUM CHLORIDE 0.9 % IV SOLN
2.0000 g | INTRAVENOUS | Status: AC
  Administered 2022-08-16 (×2): 2 g via INTRAVENOUS
  Filled 2022-08-16: qty 2

## 2022-08-16 MED ORDER — LORAZEPAM 0.5 MG PO TABS
0.5000 mg | ORAL_TABLET | Freq: Three times a day (TID) | ORAL | Status: DC | PRN
  Administered 2022-08-17 – 2022-08-22 (×9): 0.5 mg via ORAL
  Filled 2022-08-16 (×9): qty 1

## 2022-08-16 MED ORDER — ENSURE ENLIVE PO LIQD
237.0000 mL | Freq: Two times a day (BID) | ORAL | Status: DC
  Administered 2022-08-17 – 2022-08-18 (×3): 237 mL via ORAL

## 2022-08-16 MED ORDER — DEXTROSE 5 % IV SOLN
2.0000 g | Freq: Once | INTRAVENOUS | Status: DC
  Filled 2022-08-16 (×2): qty 2

## 2022-08-16 MED ORDER — KETAMINE HCL 50 MG/5ML IJ SOSY
PREFILLED_SYRINGE | INTRAMUSCULAR | Status: AC
  Filled 2022-08-16: qty 5

## 2022-08-16 MED ORDER — BUPIVACAINE HCL (PF) 0.25 % IJ SOLN
INTRAMUSCULAR | Status: DC | PRN
  Administered 2022-08-16: 30 mL

## 2022-08-16 MED ORDER — FENTANYL CITRATE (PF) 100 MCG/2ML IJ SOLN
INTRAMUSCULAR | Status: AC
  Filled 2022-08-16: qty 2

## 2022-08-16 MED ORDER — HYDROMORPHONE HCL 1 MG/ML IJ SOLN
INTRAMUSCULAR | Status: DC | PRN
  Administered 2022-08-16 (×2): 1 mg via INTRAVENOUS

## 2022-08-16 MED ORDER — MIDAZOLAM HCL 2 MG/2ML IJ SOLN
INTRAMUSCULAR | Status: AC
  Filled 2022-08-16: qty 2

## 2022-08-16 MED ORDER — HYDROMORPHONE HCL 1 MG/ML IJ SOLN
INTRAMUSCULAR | Status: AC
  Administered 2022-08-16: 0.5 mg via INTRAVENOUS
  Filled 2022-08-16: qty 2

## 2022-08-16 MED ORDER — OXYCODONE HCL 5 MG PO TABS
ORAL_TABLET | ORAL | Status: AC
  Filled 2022-08-16: qty 1

## 2022-08-16 MED ORDER — DEXAMETHASONE SODIUM PHOSPHATE 4 MG/ML IJ SOLN: 4.0000 mg | INTRAMUSCULAR | Status: DC

## 2022-08-16 MED ORDER — POVIDONE-IODINE 10 % EX SWAB: 2.0000 | Freq: Once | CUTANEOUS | Status: DC

## 2022-08-16 MED ORDER — IBUPROFEN 400 MG PO TABS
600.0000 mg | ORAL_TABLET | Freq: Four times a day (QID) | ORAL | Status: DC
  Administered 2022-08-16: 600 mg via ORAL
  Filled 2022-08-16: qty 1

## 2022-08-16 MED ORDER — STERILE WATER FOR IRRIGATION IR SOLN
Status: DC | PRN
  Administered 2022-08-16: 1000 mL

## 2022-08-16 MED ORDER — ALBUMIN HUMAN 5 % IV SOLN: INTRAVENOUS | Status: DC | PRN

## 2022-08-16 MED ORDER — HYDROMORPHONE HCL 1 MG/ML IJ SOLN
0.5000 mg | INTRAMUSCULAR | Status: AC | PRN
  Administered 2022-08-16: 0.5 mg via INTRAVENOUS

## 2022-08-16 MED ORDER — HYDROMORPHONE HCL 1 MG/ML IJ SOLN
0.5000 mg | INTRAMUSCULAR | Status: DC | PRN
  Administered 2022-08-16 – 2022-08-20 (×10): 0.5 mg via INTRAVENOUS
  Filled 2022-08-16 (×10): qty 0.5

## 2022-08-16 MED ORDER — ROCURONIUM BROMIDE 100 MG/10ML IV SOLN
INTRAVENOUS | Status: DC | PRN
  Administered 2022-08-16: 40 mg via INTRAVENOUS
  Administered 2022-08-16: 30 mg via INTRAVENOUS
  Administered 2022-08-16: 40 mg via INTRAVENOUS
  Administered 2022-08-16: 30 mg via INTRAVENOUS
  Administered 2022-08-16: 60 mg via INTRAVENOUS

## 2022-08-16 SURGICAL SUPPLY — 76 items
ADH SKN CLS APL DERMABOND .7 (GAUZE/BANDAGES/DRESSINGS) ×6
AGENT HMST KT MTR STRL THRMB (HEMOSTASIS) ×6
APL PRP STRL LF DISP 70% ISPRP (MISCELLANEOUS) ×3
BAG COUNTER SPONGE SURGICOUNT (BAG) IMPLANT
BAG SPNG CNTER NS LX DISP (BAG)
CABLE HIGH FREQUENCY MONO STRZ (ELECTRODE) IMPLANT
CELLS DAT CNTRL 66122 CELL SVR (MISCELLANEOUS) ×3 IMPLANT
CHLORAPREP W/TINT 26 (MISCELLANEOUS) ×3 IMPLANT
CNTNR URN SCR LID CUP LEK RST (MISCELLANEOUS) IMPLANT
CONT SPEC 4OZ STRL OR WHT (MISCELLANEOUS)
COVER SURGICAL LIGHT HANDLE (MISCELLANEOUS) ×3 IMPLANT
DERMABOND ADVANCED .7 DNX12 (GAUZE/BANDAGES/DRESSINGS) ×3 IMPLANT
DRAIN CHANNEL 15F RND FF 3/16 (WOUND CARE) IMPLANT
DRAPE STERI WOUND 35X35 8 5/8 (DRAPES) IMPLANT
DRAPE WARM FLUID 44X44 (DRAPES) IMPLANT
DRSG TEGADERM 4X4.75 (GAUZE/BANDAGES/DRESSINGS) IMPLANT
ELECT BLADE TIP CTD 4 INCH (ELECTRODE) IMPLANT
ELECT REM PT RETURN 15FT ADLT (MISCELLANEOUS) ×3 IMPLANT
EVACUATOR SILICONE 100CC (DRAIN) IMPLANT
GAUZE 4X4 16PLY ~~LOC~~+RFID DBL (SPONGE) IMPLANT
GAUZE SPONGE 2X2 8PLY STRL LF (GAUZE/BANDAGES/DRESSINGS) IMPLANT
GLOVE BIO SURGEON STRL SZ 6 (GLOVE) ×6 IMPLANT
GLOVE BIO SURGEON STRL SZ 6.5 (GLOVE) ×6 IMPLANT
GOWN STRL REUS W/ TWL LRG LVL3 (GOWN DISPOSABLE) ×6 IMPLANT
GOWN STRL REUS W/TWL LRG LVL3 (GOWN DISPOSABLE) ×6
HANDLE SUCTION POOLE (INSTRUMENTS) IMPLANT
HEMOSTAT ARISTA ABSORB 3G PWDR (HEMOSTASIS) IMPLANT
HOLDER FOLEY CATH W/STRAP (MISCELLANEOUS) IMPLANT
IRRIG SUCT STRYKERFLOW 2 WTIP (MISCELLANEOUS)
IRRIGATION SUCT STRKRFLW 2 WTP (MISCELLANEOUS) IMPLANT
KIT BASIN OR (CUSTOM PROCEDURE TRAY) ×3 IMPLANT
KIT TURNOVER KIT A (KITS) IMPLANT
LIGASURE IMPACT 36 18CM CVD LR (INSTRUMENTS) IMPLANT
MANIPULATOR UTERINE 4.5 ZUMI (MISCELLANEOUS) IMPLANT
PAD POSITIONING PINK XL (MISCELLANEOUS) ×3 IMPLANT
PENCIL HANDSWITCHING (ELECTRODE) IMPLANT
RELOAD PROXIMATE 75MM BLUE (ENDOMECHANICALS) ×3 IMPLANT
RELOAD STAPLE 75 3.8 BLU REG (ENDOMECHANICALS) IMPLANT
RETRACTOR WND ALEXIS 18 MED (MISCELLANEOUS) IMPLANT
RETRACTOR WND ALEXIS 25 LRG (MISCELLANEOUS) IMPLANT
RTRCTR WOUND ALEXIS 18CM MED (MISCELLANEOUS) ×3
RTRCTR WOUND ALEXIS 25CM LRG (MISCELLANEOUS) ×3
SCISSORS LAP 5X35 DISP (ENDOMECHANICALS) IMPLANT
SEALER TISSUE G2 CVD JAW 45CM (ENDOMECHANICALS) IMPLANT
SHEET LAVH (DRAPES) ×3 IMPLANT
SLEEVE Z-THREAD 5X100MM (TROCAR) ×3 IMPLANT
SPIKE FLUID TRANSFER (MISCELLANEOUS) IMPLANT
SPONGE T-LAP 18X18 ~~LOC~~+RFID (SPONGE) IMPLANT
STAPLER CVD CUT GN 40 RELOAD (ENDOMECHANICALS) ×3 IMPLANT
STAPLER CVD CUT GRN 40 RELOAD (ENDOMECHANICALS) IMPLANT
STAPLER ECHELON POWER CIR 29 (STAPLE) IMPLANT
STAPLER PROXIMATE 75MM BLUE (STAPLE) IMPLANT
SUCTION POOLE HANDLE (INSTRUMENTS) ×3
SURGIFLO W/THROMBIN 8M KIT (HEMOSTASIS) IMPLANT
SUT ETHILON 2 0 PS N (SUTURE) IMPLANT
SUT MNCRL AB 4-0 PS2 18 (SUTURE) ×6 IMPLANT
SUT PDS AB 1 TP1 96 (SUTURE) IMPLANT
SUT PROLENE 2 0 CT 1 (SUTURE) IMPLANT
SUT VIC AB 0 CT1 36 (SUTURE) IMPLANT
SUT VIC AB 2-0 CT1 27 (SUTURE) ×12
SUT VIC AB 2-0 CT1 27XBRD (SUTURE) IMPLANT
SUT VIC AB 2-0 SH 27 (SUTURE) ×9
SUT VIC AB 2-0 SH 27XBRD (SUTURE) IMPLANT
SUT VIC AB 4-0 PS2 18 (SUTURE) IMPLANT
SYS BAG RETRIEVAL 10MM (BASKET)
SYS RETRIEVAL 5MM INZII UNIV (BASKET)
SYSTEM BAG RETRIEVAL 10MM (BASKET) IMPLANT
SYSTEM RETRIEVL 5MM INZII UNIV (BASKET) IMPLANT
TOWEL OR 17X26 10 PK STRL BLUE (TOWEL DISPOSABLE) ×3 IMPLANT
TOWEL OR NON WOVEN STRL DISP B (DISPOSABLE) ×3 IMPLANT
TRAY FOLEY MTR SLVR 16FR STAT (SET/KITS/TRAYS/PACK) ×3 IMPLANT
TRAY LAPAROSCOPIC (CUSTOM PROCEDURE TRAY) ×3 IMPLANT
TROCAR ADV FIXATION 12X100MM (TROCAR) IMPLANT
TROCAR BALLN 12MMX100 BLUNT (TROCAR) IMPLANT
TROCAR Z-THREAD OPTICAL 5X100M (TROCAR) ×3 IMPLANT
YANKAUER SUCT BULB TIP 10FT TU (MISCELLANEOUS) IMPLANT

## 2022-08-16 NOTE — Op Note (Signed)
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  Postoperative Diagnosis: Same as above  Surgery: Diagnostic laparoscopy with conversion to exploratory laparotomy, radical tumor debulking including total abdominal hysterectomy, bilateral salpingo-oophorectomy, appendectomy, rectosigmoid resection with reanastomosis, total omentectomy, resection and fulguration of small bowel mesenteric nodules   Surgeons:  Eugene Garnet MD; Antionette Char, MD   Anesthesia: General   Estimated blood loss: 1100 cc  IVF: see I&O flowsheet, 2u pRBCs intra-op  Urine output:  350 cc  Complications: None apparent other than blood loss   Pathology: Omentum, small bowel mesenteric nodules, appendix, uterus, cervix, bilateral tubes and ovaries, sigmoid colon, sigmoid implants, liver adhesions  Operative findings: Diagnostic laparoscopy, normal upper abdominal survey with minimal adhesions of the left anterior liver to the abdominal wall.  Sigmoid colon densely adherent to the pelvic organs, obscuring view of bilateral ovaries as well as the uterus.  Findings, decision made to proceed with exploratory laparotomy.  Minimal adhesions noted between the anterior abdominal wall and the liver on the left, biopsied.  Diaphragm was smooth without nodularity as was the liver itself.  Stomach also smooth, no visible or palpable lesions.  No significant disease noted within the omentum.  No ascites.  Small bowel with several 1 cm or less tumor implants along the small bowel mesentery (excised or fulgurated), bowel itself normal in appearance.  Appendix adherent distally to the sigmoid colon.  Sigmoid with tumor implants along the anterior surface above the pelvic organs, adherent to the left adnexa and uterus, obscuring both from view initially.  Ovary mildly enlarged with a 2-3 cm cystic lesion, somewhat abnormal in appearance.   Fallopian tube and ovary adherent to the medial leaf of the broad ligament.  Left fallopian tube and ovary adherent posterior and lateral to the sigmoid, ovary on the side also mildly enlarged with a 4cm cystic lesion.  The bladder was adherent to the anterior uterus, obscuring it from view completely and adherent to the anterior sigmoid above the level of the uterus.  Bladder itself was otherwise normal in appearance.  On cystoscopy at the end of the surgery, bladder dome intact with no leak evident. Good efflux bilateral ureteral orifices with what appeared to be a second orifice more laterally on the left although no efflux was appreciated.  There was complete obliteration of the posterior cul-de-sac, requiring mobilization of the rectum.  Tumor implants were noted within the rectovaginal septum, excised and sent with the specimen.  Several small, 1-2 mm implants versus fecaliths were noted along the lateral aspect of the descending colon, excised. R0 resection at the end of surgery.  Procedure:  The patient was seen in the Holding Room. The risks, benefits, complications, treatment options, and expected outcomes were discussed with the patient.  The patient concurred with the proposed plan, giving informed consent.  The site of surgery properly noted/marked. The patient was identified as [name] and planned procedure reviewed including robotic versus open interval debulking surgery.  After induction of anesthesia, the patient was draped and prepped in the usual sterile manner. Patient was placed in supine position after anesthesia and draped and prepped in the usual sterile manner as follows: Her arms were tucked to her side with all appropriate precautions.  The patient was secured to the bed using padding and tape across her chest.  The patient was placed in the semi-lithotomy position in [place] stirrups.  The perineum and vagina were prepped with  Betadine. The patient's abdomen was prepped with  ChloraPrep and she was draped after the prep had been allowed to dry for 3 minutes.  A Time Out was held and the above information confirmed.  The urethra was prepped with Betadine. Foley catheter was placed.  A sterile speculum was placed in the vagina.  OG tube placement was confirmed and to suction.   Next, a 5 mm skin incision was made 1 cm below the subcostal margin in the midclavicular line.  The 5 mm Optiview port and scope was used for direct entry.  Opening pressure was under 10 mm CO2.  The abdomen was insufflated and the findings were noted as above.     With the abdomen still insufflated, a vertical paramedian midline incision was and carried down to the underlying fascia using Bovie cautery. The fascia was scored in the fascial incision was extended superiorly and inferiorly using Bovie cautery. The rectus bellies were dissected off the overlying fascia. The peritoneum was tented and entered. The peritoneal incision was extended superiorly and inferiorly with visualization of the underlying peritoneal cavity.   Attention was first turned to the omentum.  An omentectomy was performed after the omentum was dissected free from the transverse colon using monopolar electrocautery.  The omentum was removed with the assistance of the LigaSure bipolar device, preserving the short gastric vessels.  This was handed off the field.  Attention was then turned to the adhesions between the left liver lobe and anterior abdominal wall.  These were taken down with monopolar electrocautery.  A biopsy of these adhesions was taken to be sent to pathology.  The small bowel was then run from the ileocecal junction to the ligament of Treitz.  There were 2 small mesenteric implants that were excised.  Several other additional lesions on the small bowel mesentery, suspected to be treatment effect, or fulgurated.  Otherwise, no lesions were noted.  Given adherence of the appendix to the sigmoid colon and pelvic  organs, decision made to proceed with an appendectomy.  The mesentery of the appendix was isolated including the appendiceal artery which was cauterized with bipolar electrocautery and transected.  The appendix was skeletonized at its base and a GIA 75 load was used to staple and transect the appendix at its base.  The wound protector was then placed followed by the Bookwalter self-retaining retractor. At the initial placement as well as at several points during the case the lateral blades were checked to ensure no significant pressure on the psoas bellies.  The small and large bowel were packed out of the way of the surgical field with moist laparotomy sponges and malleable retractors were attached to the Bookwalter.   Attention was first turned to the left.  The sigmoid colon was mobilized from the sidewall along the line of Toldt.  The left peritoneum was incised and the retroperitoneal space was developed.  The round ligament was identified and skeletonized, cauterized, and transected.  Ultimately, the ureter was identified along the medial leaf of the broad ligament.  This was somewhat challenging given sigmoid adherence to the left sidewall.  Attention was turned to the right.  The right round ligament was transected with monopolar electrocautery and the retroperitoneum opened parallel to the IP ligament.  The ureter was identified along the medial leaf of the broad ligament and a window was made between the infundibulopelvic pelvic vessels and the ureter.  The vessels were clamped, cauterized with bipolar electrocautery, and transected.  The bladder flap was  created with some difficulty given adherence of the bladder to the sigmoid colon superior to the uterus.  Once the plane was identified, a bladder flap was created.  Attention was turned back to the left.  Ultimately, what appeared to be the left infundibulopelvic ligament was isolated, cauterized, and transected.  With the ureter identified,  the sigmoid colon was further mobilized and an area above the tumor burden was identified.  Hemostats were used with monopolar energy to skeletonized the sigmoid colon at this point and a window was created in the mesentery.  The 75 GIA stapling device was passed through the window and fired. The left ureter was again visualized laterally, and the ligasure device was used to transect and seal the left colonic mesentery.  Distal branches of the IMA and IMV were identified during this dissection and sealed with bipolar electrocautery.    With upward traction on the sigmoid, the LigaSure device was used to develop the retrorectal space, mobilizing the colon, keeping both ureters in view.  At this point, the sigmoid was able to be mobilized from the superior aspect of the uterus.  The remainder of the bladder flap was developed anteriorly.  This dissection, there was significant vascularity encountered of small perforators from the bladder, requiring several figure-of-eight's using 2-0 Vicryl as well as small and medium clips.  Ultimately, the bladder was mobilized past the level of the cervix and with a sponge stick in the vagina, the decision made to perform a reverse hysterectomy.  The posterior peritoneum was taken down to start the dissection of the rectovaginal space.  The LigaSure device was used to cauterize and transect the uterine vessels.  Using monopolar electrocautery, the colpotomy was started anteriorly.  Curved clamps were then used to take bites step-by-step circumferentially across the lateral aspect of the vagina, just inferior to the cervical junction, and then posteriorly.  Each of these bites was suture-ligated with 0 Vicryl.  Once the colpotomy had been completed, the uterine specimen was handed off the field.  The vaginal cuff was closed with interrupted figure-of-eight's using 0 Vicryl at each angle and along the length of the cuff.  The rectovaginal space was developed, further mobilizing  the rectum.  Several small tumor implants were noted within the rectovaginal septum that were excised and sent with the specimen.  The rectum was skeletonized at the planned area of distal resection. The curved contour stapler was then used to staple and transect across the rectal tube, approximately 12 cm from the anal verge.  This was handed off the field.   Proximal end of the sigmoid was then brought down into the pelvis after the colon had been further mobilized along the entire course of the descending colon, with plenty of mobilization for tension-free anastomosis.   The EEA sizers were used to measure the colonic width. A 29 mm EEA sizer was accommodated and selected.  The proximal sigmoid margin was opened and a pursestring suture was used circumferentially with prolene. The anvil from the EEA reanastamosing stapler was placed in the sigmoid colotomy site and the prolene was tied to secure it. The distal 2cm of this sigmoid colon margin was skeletonized free of its fatty tissue so that bare colonic tissue was exposed on the edges of the anvil. The rectal stump was inspected and skeletonized in a similar fashion. The 29 mm EEA anastamosing device was inserted by the surgeon into the anus and gently advanced to the rectal stump.     The EEA  sizer was again brought up to the distal rectum and the spike was deployed anterior to to the transverse staple line on the rectal stump. The mesentery was confirmed to be free of the anatomosis. The EEA anastamosing stapler was closed and the sigmoid was delivered onto the rectal stump in a tension free manner. The stapler was activated and then removed.  Two intact donuts were visualized and removed to be sent to pathology.  The staple line was examined and visually intact.  Rigid proctoscope was performed with intact staple line noted.  The pelvis was filled with saline, the sigmoid (proximal) occluded mannually and the rectum was filled with gas to confirm there  were no leaks from the anastomosis staple line.     The abdomen and pelvis was then copiously irrigated with good hemostasis noted.   A 15 mm Blake drain was placed within the right pelvis and exited the body in the right lower quadrant.  Drain stitch was used to secure the drain to the abdomen.    Given raw surfaces in the pelvis, Floseal was placed within the retrorectal and rectovaginal spaces.  The incision was then closed with running #1 looped PDS tied in the midline. The subcutaneous tissue was irrigated and hemostasis achieved. Exparel was injected for local anesthesia. The subcutaneous tissue was closed with 2-0 Vicyrl in running fashion.  Staples were used to close the incision.  The laparoscopic incision was similarly closed with staples.  Dressings were applied.   All instrument, suture, laparotomy, Ray-Tec, and needle counts were correct x2. The patient tolerated the procedure well and was taken recovery room in stable condition.   Eugene Garnet MD Gynecologic Oncology

## 2022-08-16 NOTE — Interval H&P Note (Signed)
History and Physical Interval Note:  08/16/2022 10:37 AM  [name]  has presented today for surgery, with the diagnosis of ADVANCED GYN MALIGNANCY.  The various methods of treatment have been discussed with the patient and family. After consideration of risks, benefits and other options for treatment, the patient has consented to  Procedure(s): LAPAROSCOPY DIAGNOSTIC (N/A) XI ROBOTIC ASSISTED TOTAL HYSTERECTOMY BILATERAL SALPINGO OOPHORECTOMY WITH OMENTECTOMY AND DEBULKING VERSUS OPEN (Bilateral) as a surgical intervention.  The patient's history has been reviewed, patient examined, no change in status, stable for surgery.  I have reviewed the patient's chart and labs.  Questions were answered to the patient's satisfaction.     Carver Fila

## 2022-08-16 NOTE — Consult Note (Signed)
WOC Nurse requested for preoperative stoma site marking  Discussed surgical procedure and stoma creation with patient and family.  Explained role of the WOC nurse team.  Provided the patient with educational booklet and provided samples of pouching options.  Answered patient, patient is adamant she will not have stoma   Examined patient sitting, and standing in order to place the marking in the patient's visual field, away from any creases or abdominal contour issues and within the rectus muscle.  Attempted to mark below the patient's belt line.   Marked for colostomy in the LLQ  __3__ cm to the left of the umbilicus and _2___cm below the umbilicus.  Marked for ileostomy in the RLQ  __2__cm to the right of the umbilicus and  __2.5__ cm below the umbilicus.   Patient's abdomen cleansed with CHG wipes at site markings, allowed to air dry prior to marking.Covered mark with thin film transparent dressing to preserve mark until date of surgery.   WOC Nurse team will follow up with patient after surgery for continue ostomy care and teaching.  [name] [facility] MSN, RN,CWOCN, CNS, [employer] [phone]

## 2022-08-16 NOTE — Anesthesia Procedure Notes (Signed)
Procedure Name: Intubation Date/Time: 08/16/2022 11:40 AM  Performed by: Shanon Payor, CRNAPre-anesthesia Checklist: Patient identified, Patient being monitored, Timeout performed, Emergency Drugs available and Suction available Patient Re-evaluated:Patient Re-evaluated prior to induction Oxygen Delivery Method: Circle System Utilized Preoxygenation: Pre-oxygenation with 100% oxygen Induction Type: IV induction Ventilation: Mask ventilation without difficulty Laryngoscope Size: Mac and 3 Grade View: Grade I Tube type: Oral Tube size: 7.0 mm Number of attempts: 1 Airway Equipment and Method: stylet Placement Confirmation: ETT inserted through vocal cords under direct vision, positive ETCO2 and breath sounds checked- equal and bilateral Secured at: 21 cm Tube secured with: Tape Dental Injury: Teeth and Oropharynx as per pre-operative assessment

## 2022-08-16 NOTE — Discharge Instructions (Addendum)
AFTER SURGERY INSTRUCTIONS   Return to work: 4-6 weeks if applicable   You may have a white honeycomb dressing over your larger incision. This dressing can be removed 5 days after surgery and you do not need to reapply a new dressing. Once you remove the dressing, you will notice that you have the surgical glue (dermabond) on the incision and this will peel off on its own. You can get this dressing wet in the shower the days after surgery prior to removal on the 5th day.   YOU WILL BE ABLE TO RESUME YOUR ELIQUIS WHEN YOU LEAVE THE HOSPITAL. IF YOU RECEIVED A LOVENOX BLOOD THINNER INJECTION THE DAY YOU WENT HOME, PLAN TO START THE ELIQUIS THE NEXT DAY. IF YOU DID NOT RECEIVE THE SHOT BEFORE LEAVING THE HOSPITAL, YOU CAN RESUME YOUR ELIQUIS THAT EVENING.   Activity: 1. Be up and out of the bed during the day.  Take a nap if needed.  You may walk up steps but be careful and use the hand rail.  Stair climbing will tire you more than you think, you may need to stop part way and rest.    2. No lifting or straining for 6 weeks over 10 pounds. No pushing, pulling, straining for 6 weeks.   3. No driving for around 4-74 days.  Do not drive if you are taking narcotic pain medicine and make sure that your reaction time has returned.    4. You can shower as soon as the next day after surgery. Shower daily.  Use your regular soap and water (not directly on the incision) and pat your incision(s) dry afterwards; don't rub.  No tub baths or submerging your body in water until cleared by your surgeon. If you have the soap that was given to you by pre-surgical testing that was used before surgery, you do not need to use it afterwards because this can irritate your incisions.    5. No sexual activity and nothing in the vagina for 10-12 weeks.   6. You may experience a small amount of clear drainage from your incisions, which is normal.  If the drainage persists, increases, or changes color please call the office.    7. Do not use creams, lotions, or ointments such as neosporin on your incisions after surgery until advised by your surgeon because they can cause removal of the dermabond glue on your incisions.     8. You may experience vaginal spotting after surgery or around the 6-8 week mark from surgery when the stitches at the top of the vagina begin to dissolve.  The spotting is normal but if you experience heavy bleeding, call our office.   9. Take Tylenol first for pain if you are able to take these medications and only use narcotic pain medication for severe pain not relieved by the Tylenol.  Monitor your Tylenol intake to a max of 4,000 mg in a 24 hour period.    Diet: 1. Low sodium Heart Healthy Diet is recommended but you are cleared to resume your normal (before surgery) diet after your procedure.   2. It is important to keep bowel movements regular and to prevent constipation.     Wound Care: 1. Keep clean and dry.  Shower daily.   Reasons to call the Doctor: Fever - Oral temperature greater than 100.4 degrees Fahrenheit Foul-smelling vaginal discharge Difficulty urinating Nausea and vomiting Increased pain at the site of the incision that is unrelieved with pain medicine. Difficulty breathing  with or without chest pain New calf pain especially if only on one side Sudden, continuing increased vaginal bleeding with or without clots.   Contacts: For questions or concerns you should contact:   Dr. Eugene GarnetKatherine Tucker at [phone]   Warner MccreedyMelissa Coal Nearhood, NP at [phone]   After Hours: call [phone] and have the GYN Oncologist paged/contacted (after 5 pm or on the weekends). You will speak with an after hours RN and let he or she know you have had surgery.   Messages sent via mychart are for non-urgent matters and are not responded to after hours so for urgent needs, please call the after hours number.

## 2022-08-16 NOTE — Brief Op Note (Signed)
08/16/2022  5:37 PM  PATIENT:  [name]  67 y.o. female  PRE-OPERATIVE DIAGNOSIS:  ADVANCED GYN MALIGNANCY  POST-OPERATIVE DIAGNOSIS:  ADVANCED GYN MALIGNANCY  PROCEDURE:  Procedure(s): LAPAROSCOPY DIAGNOSTIC (N/A) XI ROBOTIC ASSISTED TOTAL HYSTERECTOMY BILATERAL SALPINGO OOPHORECTOMY WITH OMENTECTOMY AND DEBULKING VERSUS OPEN (Bilateral) HYSTERECTOMY ABDOMINAL WITH SALPINGO-OOPHORECTOMY CYSTOSCOPY (N/A) COLON RESECTION SIGMOID  SURGEON:  Surgeon(s) and Role:    Carver Fila, MD - Primary    Antionette Char, MD - Assisting  ANESTHESIA:   general  EBL:  1100 mL   BLOOD ADMINISTERED:2u pRBCs  DRAINS: Blake drain   LOCAL MEDICATIONS USED:  exparel  SPECIMEN:  omentum, liver adhesions biopsy, small bowel mesentery nodule, hysterectomy/BSO/rectosigmoid colon, colon anastomosis donuts  DISPOSITION OF SPECIMEN:  pathology  COUNTS:  YES  TOURNIQUET:  * No tourniquets in log *  DICTATION: .Note written in EPIC  PLAN OF CARE: Admit to inpatient   PATIENT DISPOSITION:  PACU - hemodynamically stable.   Delay start of Pharmacological VTE agent (>24hrs) due to surgical blood loss or risk of bleeding: no

## 2022-08-16 NOTE — Transfer of Care (Signed)
Immediate Anesthesia Transfer of Care Note  Patient: [name]  Procedure(s) Performed: LAPAROSCOPY DIAGNOSTIC XI ROBOTIC ASSISTED TOTAL HYSTERECTOMY BILATERAL SALPINGO OOPHORECTOMY WITH OMENTECTOMY AND DEBULKING VERSUS OPEN (Bilateral) HYSTERECTOMY ABDOMINAL WITH SALPINGO-OOPHORECTOMY CYSTOSCOPY COLON RESECTION SIGMOID  Patient Location: PACU  Anesthesia Type:General  Level of Consciousness: awake, alert , oriented, and patient cooperative  Airway & Oxygen Therapy: Patient Spontanous Breathing and Patient connected to face mask oxygen  Post-op Assessment: Report given to RN, Post -op Vital signs reviewed and stable, and Patient moving all extremities X 4  Post vital signs: Reviewed and stable  Last Vitals:  Vitals Value Taken Time  BP 165/101 08/16/22 1736  Temp    Pulse 83 08/16/22 1738  Resp 11 08/16/22 1738  SpO2 100 % 08/16/22 1738  Vitals shown include unvalidated device data.  Last Pain:  Vitals:   08/16/22 1012  TempSrc:   PainSc: 6       Patients Stated Pain Goal: 5 (08/16/22 1012)  Complications: No notable events documented.

## 2022-08-17 ENCOUNTER — Inpatient Hospital Stay (HOSPITAL_COMMUNITY): Payer: 59

## 2022-08-17 ENCOUNTER — Other Ambulatory Visit: Payer: Self-pay

## 2022-08-17 ENCOUNTER — Encounter: Payer: Self-pay | Admitting: Hematology and Oncology

## 2022-08-17 ENCOUNTER — Encounter (HOSPITAL_COMMUNITY): Payer: Self-pay | Admitting: Obstetrics & Gynecology

## 2022-08-17 LAB — TYPE AND SCREEN
ABO/RH(D): O POS
Antibody Screen: NEGATIVE
Unit division: 0
Unit division: 0

## 2022-08-17 LAB — BASIC METABOLIC PANEL
Anion gap: 6 (ref 5–15)
BUN: 13 mg/dL (ref 8–23)
CO2: 25 mmol/L (ref 22–32)
Calcium: 8 mg/dL — ABNORMAL LOW (ref 8.9–10.3)
Chloride: 106 mmol/L (ref 98–111)
Creatinine, Ser: 0.76 mg/dL (ref 0.44–1.00)
GFR, Estimated: >60 mL/min (ref >60)
Glucose, Bld: 146 mg/dL — ABNORMAL HIGH (ref 70–99)
Potassium: 4 mmol/L (ref 3.5–5.1)
Sodium: 137 mmol/L (ref 135–145)

## 2022-08-17 LAB — CBC
HCT: 36.1 % (ref 36.0–46.0)
Hemoglobin: 12.3 g/dL (ref 12.0–15.0)
MCH: 33.2 pg (ref 26.0–34.0)
MCHC: 34.1 g/dL (ref 30.0–36.0)
MCV: 97.3 fL (ref 80.0–100.0)
Platelets: 113 10*3/uL — ABNORMAL LOW (ref 150–400)
RBC: 3.71 MIL/uL — ABNORMAL LOW (ref 3.87–5.11)
RDW: 15.1 % (ref 11.5–15.5)
WBC: 10.2 10*3/uL (ref 4.0–10.5)
nRBC: 0 % (ref 0.0–0.2)

## 2022-08-17 LAB — BPAM RBC
Blood Product Expiration Date: 202405092359
ISSUE DATE / TIME: 202404101506
ISSUE DATE / TIME: 202404101506
Unit Type and Rh: 5100

## 2022-08-17 LAB — CORTISOL: Cortisol, Plasma: 30.9 ug/dL

## 2022-08-17 MED ORDER — NALOXONE HCL 0.4 MG/ML IJ SOLN: 0.4000 mg | INTRAMUSCULAR | Status: DC | PRN

## 2022-08-17 MED ORDER — OXYCODONE HCL ER 15 MG PO T12A
15.0000 mg | EXTENDED_RELEASE_TABLET | Freq: Two times a day (BID) | ORAL | Status: DC
  Administered 2022-08-17 – 2022-08-20 (×8): 15 mg via ORAL
  Filled 2022-08-17 (×8): qty 1

## 2022-08-17 MED ORDER — IRBESARTAN 150 MG PO TABS
300.0000 mg | ORAL_TABLET | Freq: Every day | ORAL | Status: DC
  Administered 2022-08-17 – 2022-08-22 (×6): 300 mg via ORAL
  Filled 2022-08-17 (×6): qty 2

## 2022-08-17 MED ORDER — NALOXONE HCL 4 MG/0.1ML NA LIQD
1.0000 | NASAL | Status: DC | PRN
Start: 2022-08-17 — End: 2022-08-17

## 2022-08-17 MED ORDER — MOMETASONE FURO-FORMOTEROL FUM 200-5 MCG/ACT IN AERO
2.0000 | INHALATION_SPRAY | Freq: Two times a day (BID) | RESPIRATORY_TRACT | Status: DC
  Administered 2022-08-17 – 2022-08-22 (×10): 2 via RESPIRATORY_TRACT
  Filled 2022-08-17: qty 8.8

## 2022-08-17 MED ORDER — OXYCODONE HCL 5 MG PO TABS
20.0000 mg | ORAL_TABLET | Freq: Four times a day (QID) | ORAL | Status: DC | PRN
  Administered 2022-08-17 – 2022-08-22 (×15): 20 mg via ORAL
  Filled 2022-08-17 (×15): qty 4

## 2022-08-17 NOTE — TOC CM/SW Note (Signed)
Transition of Care [facility]) Screening Note  Patient Details  Name: [name] Date of Birth: 12-Nov-1955  Transition of Care [facility]) CM/SW Contact:    Ewing Schlein, LCSW Phone Number: 08/17/2022, 9:35 AM  Transition of Care Department [facility]) has reviewed patient and no TOC needs have been identified at this time. We will continue to monitor patient advancement through interdisciplinary progression rounds. If new patient transition needs arise, please place a TOC consult.

## 2022-08-17 NOTE — Plan of Care (Addendum)
Foley d/c at 0600. Got patient out of bed and stood at the bedside with walker and returned back to bed.   A/ox4. Abd dressing with small amount of serosanguineous drainage. JP intact to bulb suction. Patient pain 10/10. Received prn pain meds 5 times over night. Attempt to get pt oob to stand up but pt declined d/t pain. Offered again this morning after pain meds and pt still decline d/t to her pain. Will attempt to get patient out of bed again at 0600 when foley is due to be remove.   Problem: Education: Goal: Knowledge of General Education information will improve Description: Including pain rating scale, medication(s)/side effects and non-pharmacologic comfort measures Outcome: Progressing   Problem: Clinical Measurements: Goal: Ability to maintain clinical measurements within normal limits will improve Outcome: Progressing Goal: Will remain free from infection Outcome: Progressing Goal: Diagnostic test results will improve Outcome: Progressing Goal: Respiratory complications will improve Outcome: Progressing Goal: Cardiovascular complication will be avoided Outcome: Progressing   Problem: Activity: Goal: Risk for activity intolerance will decrease Outcome: Progressing   Problem: Nutrition: Goal: Adequate nutrition will be maintained Outcome: Progressing   Problem: Coping: Goal: Level of anxiety will decrease Outcome: Progressing   Problem: Pain Managment: Goal: General experience of comfort will improve Outcome: Progressing   Problem: Skin Integrity: Goal: Risk for impaired skin integrity will decrease Outcome: Progressing

## 2022-08-17 NOTE — Progress Notes (Signed)
GYN Oncology Progress Note  Patient states her pain has improved and is stable at this time. She is tolerating sips of clears. No nausea or emesis reported. Had a small amount of flatus. Voiding since foley removal. Ambulating in the halls without difficulty. Sisters at the bedside. Patient concerned about RUE soreness, bruising, and palpable area of raised tissue under the skin. States she noticed the area around 2-3 days ago but now it is more noticeable and tender. No edema in the lower extrem.  Continue plan of care. Pt not wanting diet advanced at this time. Plan for RUE ultrasound to future evaluate.

## 2022-08-17 NOTE — Progress Notes (Signed)
Gynecologic Oncology Progress Note  1 Day Post-Op Procedure(s) (LRB): LAPAROSCOPY DIAGNOSTIC (N/A) XI ROBOTIC ASSISTED TOTAL HYSTERECTOMY BILATERAL SALPINGO OOPHORECTOMY WITH OMENTECTOMY AND DEBULKING VERSUS OPEN (Bilateral) HYSTERECTOMY ABDOMINAL WITH SALPINGO-OOPHORECTOMY CYSTOSCOPY (N/A) COLON RESECTION SIGMOID  Subjective: Patient reports no sleep last pm. "Not a good morning." Having severe pain. "Sciatic nerve pain is acting up and I have not felt that in six years." No flatus. Denies shortness of breath. Feels she is breathing shallow due to pain. No nausea. Tolerating sips. All questions answered per Dr. Pricilla Holm.   Objective: Vital signs in last 24 hours: Temp:  [97.2 F (36.2 C)-98.8 F (37.1 C)] 98.6 F (37 C) (04/11 0528) Pulse Rate:  [75-85] 80 (04/11 0528) Resp:  [9-19] 18 (04/11 0528) BP: (142-185)/(88-113) 171/100 (04/11 0528) SpO2:  [100 %] 100 % (04/11 0528) Weight:  [175 lb (79.4 kg)] 175 lb (79.4 kg) (04/11 0444) Last BM Date : 08/15/22  Intake/Output from previous day: 04/10 0701 - 04/11 0700 In: 5647.4 [P.O.:240; I.V.:3977.4; Blood:630; IV Piggyback:800] Out: 4300 [Urine:2975; Drains:225; Blood:1100]  Physical Examination: General: alert, cooperative, and grimacing intermittently due to pain Resp: clear to auscultation bilaterally Cardio: regular rate and rhythm, S1, S2 normal, no murmur, click, rub or gallop GI: incision: midline abdominal incision with op site dressing in place with staples with no drainage noted underneath, lap sites intact with dermabond and abdomen soft, tender, non-distended, active bowel sounds, binder in place Extremities: extremities normal, atraumatic, no cyanosis or edema  Labs: WBC/Hgb/Hct/Plts:  10.2/12.3/36.1/113 (04/11 0453) BUN/Cr/glu/ALT/AST/amyl/lip:  13/0.76/--/--/--/--/-- (04/11 0453)  Assessment: 67 y.o. s/p Procedure(s): LAPAROSCOPY DIAGNOSTIC XI ROBOTIC ASSISTED TOTAL HYSTERECTOMY BILATERAL SALPINGO OOPHORECTOMY  WITH OMENTECTOMY AND DEBULKING VERSUS OPEN HYSTERECTOMY ABDOMINAL WITH SALPINGO-OOPHORECTOMY, CYSTOSCOPY, COLON RESECTION SIGMOID: stable  Pain:  Pain is not well-controlled on PRN medications. Chronic pain medication regimen reordered.  Heme: Hgb 12.3 and Hct 36.1 this am. Given 2 units pRBCs intra-op. Surgical EBL around 1100 cc.   ID: WBC 10.2- given decadron intra-op along with IV antibiotic. No evidence of infection at this time.  CV: BP elevated at times. Avapro reordered.   GI:  Tolerating po: yes, sips. Antiemetics ordered if needed.  GU: Adequate urine documented overnight. Creatinine 0.76 this am.    FEN: No critical values on am labs.  Endo: Hx prediabetes. Random glucose on Bmet 146.  Prophylaxis: SCDs. Lovenox ordered.  Plan: Chronic pain regimen resumed Reordered avapro Will need to have BM before discharge Encourage increasing mobility, IS use Foley removal today IVF to 50 Continue plan of care per Dr. Pricilla Holm   LOS: 1 day    [name] [name] 08/17/2022, 7:15 AM

## 2022-08-17 NOTE — Anesthesia Postprocedure Evaluation (Signed)
Anesthesia Post Note  Patient: [name]  Procedure(s) Performed: LAPAROSCOPY DIAGNOSTIC XI ROBOTIC ASSISTED TOTAL HYSTERECTOMY BILATERAL SALPINGO OOPHORECTOMY WITH OMENTECTOMY AND DEBULKING VERSUS OPEN (Bilateral) HYSTERECTOMY ABDOMINAL WITH SALPINGO-OOPHORECTOMY CYSTOSCOPY COLON RESECTION SIGMOID     Patient location during evaluation: PACU Anesthesia Type: General Level of consciousness: awake and alert Pain management: pain level controlled Vital Signs Assessment: post-procedure vital signs reviewed and stable Respiratory status: spontaneous breathing, nonlabored ventilation, respiratory function stable and patient connected to nasal cannula oxygen Cardiovascular status: blood pressure returned to baseline and stable Postop Assessment: no apparent nausea or vomiting Anesthetic complications: no  No notable events documented.  Last Vitals:  Vitals:   08/16/22 2154 08/17/22 0200  BP: (!) 185/102 (!) 159/96  Pulse: 85 80  Resp: 18 19  Temp: 36.6 C 37.1 C  SpO2: 100% 100%    Last Pain:  Vitals:   08/17/22 0426  TempSrc:   PainSc: 10-Worst pain ever                 Trevor Iha

## 2022-08-18 ENCOUNTER — Inpatient Hospital Stay (HOSPITAL_COMMUNITY): Payer: 59

## 2022-08-18 DIAGNOSIS — M79601 Pain in right arm: Secondary | ICD-10-CM | POA: Diagnosis not present

## 2022-08-18 LAB — CBC
HCT: 35.1 % — ABNORMAL LOW (ref 36.0–46.0)
Hemoglobin: 11.9 g/dL — ABNORMAL LOW (ref 12.0–15.0)
MCH: 33.2 pg (ref 26.0–34.0)
MCHC: 33.9 g/dL (ref 30.0–36.0)
MCV: 98 fL (ref 80.0–100.0)
Platelets: 134 10*3/uL — ABNORMAL LOW (ref 150–400)
RBC: 3.58 MIL/uL — ABNORMAL LOW (ref 3.87–5.11)
RDW: 14.8 % (ref 11.5–15.5)
WBC: 8 10*3/uL (ref 4.0–10.5)
nRBC: 0 % (ref 0.0–0.2)

## 2022-08-18 LAB — BASIC METABOLIC PANEL
Anion gap: 8 (ref 5–15)
BUN: 7 mg/dL — ABNORMAL LOW (ref 8–23)
CO2: 25 mmol/L (ref 22–32)
Calcium: 8.7 mg/dL — ABNORMAL LOW (ref 8.9–10.3)
Chloride: 101 mmol/L (ref 98–111)
Creatinine, Ser: 0.76 mg/dL (ref 0.44–1.00)
GFR, Estimated: >60 mL/min (ref >60)
Glucose, Bld: 153 mg/dL — ABNORMAL HIGH (ref 70–99)
Potassium: 4.2 mmol/L (ref 3.5–5.1)
Sodium: 134 mmol/L — ABNORMAL LOW (ref 135–145)

## 2022-08-18 MED ORDER — DOCUSATE SODIUM 100 MG PO CAPS
100.0000 mg | ORAL_CAPSULE | Freq: Two times a day (BID) | ORAL | Status: DC
  Administered 2022-08-18 – 2022-08-22 (×8): 100 mg via ORAL
  Filled 2022-08-18 (×8): qty 1

## 2022-08-18 MED ORDER — AMPHETAMINE-DEXTROAMPHETAMINE 10 MG PO TABS
10.0000 mg | ORAL_TABLET | ORAL | Status: DC
Start: 2022-08-18 — End: 2022-08-18

## 2022-08-18 MED ORDER — SIMETHICONE 80 MG PO CHEW
80.0000 mg | CHEWABLE_TABLET | Freq: Four times a day (QID) | ORAL | Status: DC
  Administered 2022-08-18 – 2022-08-22 (×19): 80 mg via ORAL
  Filled 2022-08-18 (×19): qty 1

## 2022-08-18 MED ORDER — PSYLLIUM 95 % PO PACK
1.0000 | PACK | Freq: Every day | ORAL | Status: DC
  Administered 2022-08-18 – 2022-08-22 (×5): 1 via ORAL
  Filled 2022-08-18 (×5): qty 1

## 2022-08-18 NOTE — Progress Notes (Signed)
Gynecologic Oncology Progress Note  2 Days Post-Op Procedure(s) (LRB): LAPAROSCOPY DIAGNOSTIC (N/A) XI ROBOTIC ASSISTED TOTAL HYSTERECTOMY BILATERAL SALPINGO OOPHORECTOMY WITH OMENTECTOMY AND DEBULKING VERSUS OPEN (Bilateral) HYSTERECTOMY ABDOMINAL WITH SALPINGO-OOPHORECTOMY CYSTOSCOPY (N/A) COLON RESECTION SIGMOID  Subjective: Patient reports being up and down all night having to urinate. No flatus reported. States she only had a small pea size amount of stool come out. No nausea or emesis reported but does report a decreased appetite. Tolerating sips of clears. Feels discomfort in her upper abdomen at the level of the diaphragm. This slightly decreased with belching several times. Feels she is breathing shallow when moving due to pain. Abdomen felt tight to her last pm. Pain is managed with current regimen.  Objective: Vital signs in last 24 hours: Temp:  [98.2 F (36.8 C)-99.6 F (37.6 C)] 98.4 F (36.9 C) (04/12 0533) Pulse Rate:  [78-99] 89 (04/12 0604) Resp:  [16-19] 18 (04/12 0533) BP: (138-168)/(94-99) 168/94 (04/12 0604) SpO2:  [96 %-100 %] 96 % (04/12 0533) Last BM Date : 08/15/22  Intake/Output from previous day: 04/11 0701 - 04/12 0700 In: 2713 [P.O.:1240; I.V.:1473] Out: 3045 [Urine:3000; Drains:45]  Physical Examination: General: alert, cooperative, and grimacing intermittently due to pain, appears restless Resp: clear to auscultation bilaterally Cardio: regular rate and rhythm, S1, S2 normal, no murmur, click, rub or gallop GI: incision: midline abdominal incision with op site dressing in place with staples with no drainage noted underneath, lap sites intact with dermabond and abdomen soft, tender, mildly distended, hypoactive bowel sounds, tympanic in upper quadrants, binder in place. Once binder removed, pt felt some relief in upper abdominal discomfort Extremities: extremities normal, atraumatic, no cyanosis or edema JP drain with serosanguinous drainage. Drain  stripped and 25 cc emptied.  Labs:   BUN/Cr/glu/ALT/AST/amyl/lip:  7/0.76/--/--/--/--/-- (04/12 0559)  Assessment: 67 y.o. s/p Procedure(s): LAPAROSCOPY DIAGNOSTIC XI ROBOTIC ASSISTED TOTAL HYSTERECTOMY BILATERAL SALPINGO OOPHORECTOMY WITH OMENTECTOMY AND DEBULKING VERSUS OPEN HYSTERECTOMY ABDOMINAL WITH SALPINGO-OOPHORECTOMY, CYSTOSCOPY, COLON RESECTION SIGMOID: stable  Pain:  Pain is well-controlled on PRN medications. Chronic pain medication regimen has been ordered.  Heme: CBC from early am still in process. Hgb 12.3 and Hct 36.1 4/11 am. Given 2 units pRBCs intra-op. Surgical EBL around 1100 cc.   ID: WBC 10.2 on 08/17/22- given decadron intra-op along with IV antibiotic. No evidence of infection at this time.  CV: BP elevated at times. Avapro reordered.   GI:  Tolerating po: yes, sips. Antiemetics ordered if needed.  GU: Adequate urine documented overnight. Creatinine 0.76 08/17/22 am.    FEN: No critical values on am labs.  Endo: Hx prediabetes. Random glucose on Bmet 153.  Prophylaxis: SCDs. Lovenox ordered.  Plan: Lab notified about pending CBC results Simethicone ordered Diet to full liquids but pt advised to take in slowly Will need to have BM before discharge Encourage increasing mobility, IS use Continue plan of care per Dr. Pricilla Holm   LOS: 2 days    [name] 08/18/2022, 8:48 AM

## 2022-08-18 NOTE — Progress Notes (Signed)
RUE venous duplex has been completed.    Results can be found under chart review under CV PROC. 08/18/2022 9:59 AM Liliana Brentlinger RVT, RDMS

## 2022-08-19 LAB — BASIC METABOLIC PANEL
Anion gap: 5 (ref 5–15)
BUN: 7 mg/dL — ABNORMAL LOW (ref 8–23)
CO2: 26 mmol/L (ref 22–32)
Calcium: 9.1 mg/dL (ref 8.9–10.3)
Chloride: 105 mmol/L (ref 98–111)
Creatinine, Ser: 0.74 mg/dL (ref 0.44–1.00)
GFR, Estimated: >60 mL/min (ref >60)
Glucose, Bld: 124 mg/dL — ABNORMAL HIGH (ref 70–99)
Potassium: 3.7 mmol/L (ref 3.5–5.1)
Sodium: 136 mmol/L (ref 135–145)

## 2022-08-19 LAB — CBC
HCT: 34.1 % — ABNORMAL LOW (ref 36.0–46.0)
Hemoglobin: 11.5 g/dL — ABNORMAL LOW (ref 12.0–15.0)
MCH: 32.9 pg (ref 26.0–34.0)
MCHC: 33.7 g/dL (ref 30.0–36.0)
MCV: 97.4 fL (ref 80.0–100.0)
Platelets: 144 10*3/uL — ABNORMAL LOW (ref 150–400)
RBC: 3.5 MIL/uL — ABNORMAL LOW (ref 3.87–5.11)
RDW: 14.4 % (ref 11.5–15.5)
WBC: 6.7 10*3/uL (ref 4.0–10.5)
nRBC: 0 % (ref 0.0–0.2)

## 2022-08-19 MED ORDER — BOOST / RESOURCE BREEZE PO LIQD CUSTOM
1.0000 | Freq: Three times a day (TID) | ORAL | Status: DC
  Administered 2022-08-19 (×2): 1 via ORAL

## 2022-08-19 NOTE — Progress Notes (Signed)
Gynecologic Oncology Progress Note  3 Days Post-Op Procedure(s) (LRB): LAPAROSCOPY DIAGNOSTIC (N/A) XI ROBOTIC ASSISTED TOTAL HYSTERECTOMY BILATERAL SALPINGO OOPHORECTOMY WITH OMENTECTOMY AND DEBULKING VERSUS OPEN (Bilateral) HYSTERECTOMY ABDOMINAL WITH SALPINGO-OOPHORECTOMY CYSTOSCOPY (N/A) COLON RESECTION SIGMOID  Subjective: -N/V/flatus.  Incisional discomfort w/ambulation  Objective: Vital signs in last 24 hours: Temp:  [97.8 F (36.6 C)-98.6 F (37 C)] 97.8 F (36.6 C) (04/13 0538) Pulse Rate:  [85-91] 85 (04/13 0538) Resp:  [17-18] 18 (04/13 0538) BP: (140-156)/(77-96) 151/77 (04/13 0538) SpO2:  [99 %-100 %] 99 % (04/13 0854) Last BM Date : 08/15/22  Intake/Output from previous day: 04/12 0701 - 04/13 0700 In: 505 [P.O.:480] Out: 1480 [Urine:1450; Drains:30]  Physical Examination: General: alert, cooperative, and grimacing intermittently due to pain, appears restless Resp: clear to auscultation bilaterally Cardio: regular rate and rhythm, S1, S2 normal, no murmur, click, rub or gallop GI: incision: midline abdominal incision with op site dressing in place with staples with no drainage noted underneath, lap sites intact with dermabond and abdomen soft, mild tenderness, mildly distended Extremities: extremities normal, atraumatic, no cyanosis or edema JP drain with serosanguinous drainage.   Labs: WBC/Hgb/Hct/Plts:  6.7/11.5/34.1/144 (04/13 0449) BUN/Cr/glu/ALT/AST/amyl/lip:  7/0.74/--/--/--/--/-- (04/13 0449)  Assessment: 67 y.o. s/p Procedure(s): LAPAROSCOPY DIAGNOSTIC XI ROBOTIC ASSISTED TOTAL HYSTERECTOMY BILATERAL SALPINGO OOPHORECTOMY WITH OMENTECTOMY AND DEBULKING VERSUS OPEN HYSTERECTOMY ABDOMINAL WITH SALPINGO-OOPHORECTOMY, CYSTOSCOPY, COLON RESECTION SIGMOID: stable  Pain:  Pain is well-controlled on PRN medications.   Heme: Mild postop anemia   CV: H/O chronic HTN.  Borderline BP elevation on Avapro   GI:  Tolerating soft diet   FEN: No electrolyte  disturbances   Prophylaxis: SCDs. Lovenox ordered.  Plan: Continue soft diet, nutritional supplements Encouraged OOB, IS use Continue plan of care per Dr. Pricilla Holm   LOS: 3 days    Antionette Char 08/19/2022, 10:06 AM

## 2022-08-20 MED ORDER — AMPHETAMINE-DEXTROAMPHET ER 10 MG PO CP24
20.0000 mg | ORAL_CAPSULE | Freq: Every day | ORAL | Status: DC
  Administered 2022-08-20: 20 mg via ORAL
  Filled 2022-08-20: qty 2
  Filled 2022-08-20: qty 1
  Filled 2022-08-20: qty 2

## 2022-08-20 NOTE — Progress Notes (Signed)
Dr.Tucker aware JP bulb damaged and not creating suction at incisional wound. Offered to remove tube-not draining a lot. Dr.Tucker instructed me to leave drain until her team came in the morning. Pt reassured.

## 2022-08-20 NOTE — Progress Notes (Signed)
Gynecologic Oncology Progress Note  4 Days Post-Op Procedure(s) (LRB): LAPAROSCOPY DIAGNOSTIC (N/A) XI ROBOTIC ASSISTED TOTAL HYSTERECTOMY BILATERAL SALPINGO OOPHORECTOMY WITH OMENTECTOMY AND DEBULKING VERSUS OPEN (Bilateral) HYSTERECTOMY ABDOMINAL WITH SALPINGO-OOPHORECTOMY CYSTOSCOPY (N/A) COLON RESECTION SIGMOID  Subjective: -N/V. +flatus. C/O brain fog Objective: Vital signs in last 24 hours: Temp:  [97.6 F (36.4 C)-98.7 F (37.1 C)] 98.5 F (36.9 C) (04/14 0929) Pulse Rate:  [90-95] 94 (04/14 0929) Resp:  [15-18] 18 (04/14 0929) BP: (115-176)/(70-107) 115/72 (04/14 0929) SpO2:  [97 %-100 %] 99 % (04/14 0929) Last BM Date : 08/15/22  Intake/Output from previous day: 04/13 0701 - 04/14 0700 In: 480 [P.O.:480] Out: 2355 [Urine:2350; Drains:5]  Physical Examination: General: alert, cooperative, and grimacing intermittently due to pain, appears restless Resp: clear to auscultation bilaterally Cardio: regular rate and rhythm, S1, S2 normal, no murmur, click, rub or gallop GI: Hypoactive BS; incision: midline abdominal incision with op site dressing in place with staples with no drainage noted underneath, lap sites intact with dermabond and abdomen soft, mild tenderness, mildly distended Extremities: extremities normal, atraumatic, no cyanosis or edema JP drain with serosanguinous drainage.    Assessment: 67 y.o. s/p Procedure(s): LAPAROSCOPY DIAGNOSTIC XI ROBOTIC ASSISTED TOTAL HYSTERECTOMY BILATERAL SALPINGO OOPHORECTOMY WITH OMENTECTOMY AND DEBULKING VERSUS OPEN HYSTERECTOMY ABDOMINAL WITH SALPINGO-OOPHORECTOMY, CYSTOSCOPY, COLON RESECTION SIGMOID: stable  Pain:  Pain is well-controlled on PRN medications.   CV: H/O chronic HTN.  BPs controlled on Avapro   GI:  Tolerating soft diet   Prophylaxis: SCDs. Lovenox ordered.  Plan: Advance diet Resume Adderall  Encouraged OOB, IS use Discharge planning   LOS: 4 days    [name] 08/20/2022, 10:48  AM

## 2022-08-21 LAB — BASIC METABOLIC PANEL
Anion gap: 8 (ref 5–15)
BUN: 10 mg/dL (ref 8–23)
CO2: 25 mmol/L (ref 22–32)
Calcium: 8.7 mg/dL — ABNORMAL LOW (ref 8.9–10.3)
Chloride: 101 mmol/L (ref 98–111)
Creatinine, Ser: 0.76 mg/dL (ref 0.44–1.00)
GFR, Estimated: >60 mL/min (ref >60)
Glucose, Bld: 133 mg/dL — ABNORMAL HIGH (ref 70–99)
Potassium: 3.9 mmol/L (ref 3.5–5.1)
Sodium: 134 mmol/L — ABNORMAL LOW (ref 135–145)

## 2022-08-21 LAB — CBC
HCT: 31.3 % — ABNORMAL LOW (ref 36.0–46.0)
Hemoglobin: 10.6 g/dL — ABNORMAL LOW (ref 12.0–15.0)
MCH: 32.9 pg (ref 26.0–34.0)
MCHC: 33.9 g/dL (ref 30.0–36.0)
MCV: 97.2 fL (ref 80.0–100.0)
Platelets: 221 10*3/uL (ref 150–400)
RBC: 3.22 MIL/uL — ABNORMAL LOW (ref 3.87–5.11)
RDW: 14 % (ref 11.5–15.5)
WBC: 5.1 10*3/uL (ref 4.0–10.5)
nRBC: 0 % (ref 0.0–0.2)

## 2022-08-21 LAB — PHOSPHORUS: Phosphorus: 3.9 mg/dL (ref 2.5–4.6)

## 2022-08-21 LAB — MAGNESIUM: Magnesium: 1.9 mg/dL (ref 1.7–2.4)

## 2022-08-21 NOTE — Addendum Note (Signed)
Encounter addended by: Arby Barrette on: 08/21/2022 10:21 AM  Actions taken: Imaging Exam ended

## 2022-08-21 NOTE — Progress Notes (Signed)
Gynecologic Oncology Progress Note  5 Days Post-Op Procedure(s) (LRB): LAPAROSCOPY DIAGNOSTIC (N/A) XI ROBOTIC ASSISTED TOTAL HYSTERECTOMY BILATERAL SALPINGO OOPHORECTOMY WITH OMENTECTOMY AND DEBULKING VERSUS OPEN (Bilateral) HYSTERECTOMY ABDOMINAL WITH SALPINGO-OOPHORECTOMY CYSTOSCOPY (N/A) COLON RESECTION SIGMOID  Subjective: Patient reports feeling much better this am and passing flatus this am. Belching intermittently. No BM reported. Tolerating diet with no nausea or emesis. Feels steady when out of bed. Voiding without difficulty. Pain is manageable. She wants to try holding her OxyContin today to see if this helps with her bowels. She took adderall yesterday due to symptoms of brain fog then after felt this was not the best decision to resume at this time. Denies chest pain, dyspnea. Pt states JP drain was fixed after switching out the bulb. No needs voiced.   Objective: Vital signs in last 24 hours: Temp:  [98.2 F (36.8 C)-98.7 F (37.1 C)] 98.7 F (37.1 C) (04/15 0635) Pulse Rate:  [82-105] 82 (04/15 0635) Resp:  [16-18] 17 (04/15 0635) BP: (115-133)/(72-90) 129/90 (04/15 0635) SpO2:  [97 %-99 %] 98 % (04/15 0745) Last BM Date : 08/15/22  Intake/Output from previous day: 04/14 0701 - 04/15 0700 In: 840 [P.O.:840] Out: 2065 [Urine:2050; Drains:15]  Physical Examination: General: alert, cooperative, and no distress Resp: clear to auscultation bilaterally Cardio: regular rate and rhythm, S1, S2 normal, no murmur, click, rub or gallop GI: incision: midline abdominal incision with op site dressing in place with staples with no drainage noted underneath, lap sites intact with dermabond and abdomen soft, active bowel sounds, less distended compared to 4/12 assessment Extremities: extremities normal, atraumatic, no cyanosis or edema JP drain with serosanguinous drainage. Drain stripped with minimal drainage present. No issues visualized with drain, adequate suction  present.  Labs: WBC/Hgb/Hct/Plts:  5.1/10.6/31.3/221 (04/15 9678) BUN/Cr/glu/ALT/AST/amyl/lip:  10/0.76/--/--/--/--/-- (04/15 9381)  Assessment: 67 y.o. s/p Procedure(s): LAPAROSCOPY DIAGNOSTIC XI ROBOTIC ASSISTED TOTAL HYSTERECTOMY BILATERAL SALPINGO OOPHORECTOMY WITH OMENTECTOMY AND DEBULKING VERSUS OPEN HYSTERECTOMY ABDOMINAL WITH SALPINGO-OOPHORECTOMY, CYSTOSCOPY, COLON RESECTION SIGMOID: stable  Pain:  Pain is well-controlled on PRN medications. Chronic pain medication regimen has been ordered.  Heme: Hgb 10.6 and Hct 31.3 this am. Given 2 units pRBCs intra-op. Surgical EBL around 1100 cc.   ID: WBC 5.1 this am. Given decadron intra-op along with IV antibiotic. No evidence of infection at this time.  CV: BP elevated at times. Avapro ordered.   GI:  Tolerating po: yes. Antiemetics ordered if needed.  GU: Adequate urine documented overnight. Creatinine 0.76 this am.    FEN: No critical values on am labs.  Endo: Hx prediabetes. Random glucose on Bmet 133 this am.  Prophylaxis: SCDs. Lovenox ordered.  Plan: Diet as tolerated Will need to have BM before discharge per Dr. Pricilla Holm JP drain to be removed at discharge Encourage increasing mobility, IS use Continue plan of care per Dr. Pricilla Holm   LOS: 5 days    [name] 08/21/2022, 8:10 AM

## 2022-08-21 NOTE — Addendum Note (Signed)
Encounter addended by: Arby Barrette on: 08/21/2022 10:12 AM  Actions taken: Imaging Exam ended, Charge Capture section accepted

## 2022-08-22 LAB — CBC WITH DIFFERENTIAL/PLATELET
Abs Immature Granulocytes: 0.03 10*3/uL (ref 0.00–0.07)
Basophils Absolute: 0 10*3/uL (ref 0.0–0.1)
Basophils Relative: 0 %
Eosinophils Absolute: 0.5 10*3/uL (ref 0.0–0.5)
Eosinophils Relative: 10 %
HCT: 33 % — ABNORMAL LOW (ref 36.0–46.0)
Hemoglobin: 11 g/dL — ABNORMAL LOW (ref 12.0–15.0)
Immature Granulocytes: 1 %
Lymphocytes Relative: 25 %
Lymphs Abs: 1.2 10*3/uL (ref 0.7–4.0)
MCH: 33 pg (ref 26.0–34.0)
MCHC: 33.3 g/dL (ref 30.0–36.0)
MCV: 99.1 fL (ref 80.0–100.0)
Monocytes Absolute: 0.8 10*3/uL (ref 0.1–1.0)
Monocytes Relative: 17 %
Neutro Abs: 2.1 10*3/uL (ref 1.7–7.7)
Neutrophils Relative %: 47 %
Platelets: 275 10*3/uL (ref 150–400)
RBC: 3.33 MIL/uL — ABNORMAL LOW (ref 3.87–5.11)
RDW: 14.3 % (ref 11.5–15.5)
WBC: 4.6 10*3/uL (ref 4.0–10.5)
nRBC: 0 % (ref 0.0–0.2)

## 2022-08-22 LAB — BASIC METABOLIC PANEL
Anion gap: 7 (ref 5–15)
BUN: 10 mg/dL (ref 8–23)
CO2: 27 mmol/L (ref 22–32)
Calcium: 8.9 mg/dL (ref 8.9–10.3)
Chloride: 102 mmol/L (ref 98–111)
Creatinine, Ser: 0.73 mg/dL (ref 0.44–1.00)
GFR, Estimated: >60 mL/min (ref >60)
Glucose, Bld: 150 mg/dL — ABNORMAL HIGH (ref 70–99)
Potassium: 3.5 mmol/L (ref 3.5–5.1)
Sodium: 136 mmol/L (ref 135–145)

## 2022-08-22 LAB — MAGNESIUM: Magnesium: 2 mg/dL (ref 1.7–2.4)

## 2022-08-22 MED ORDER — DOCUSATE SODIUM 100 MG PO CAPS: 100.0000 mg | ORAL_CAPSULE | Freq: Two times a day (BID) | ORAL | 0 refills | Status: DC

## 2022-08-22 MED ORDER — APIXABAN 2.5 MG PO TABS: 2.5000 mg | ORAL_TABLET | Freq: Two times a day (BID) | ORAL | 11 refills | Status: DC

## 2022-08-22 NOTE — Progress Notes (Signed)
Mobility Specialist - Progress Note   08/22/22 1349  Mobility  Activity Ambulated with assistance in hallway  Level of Assistance Modified independent, requires aide device or extra time  Assistive Device Other (Comment) (IV Pole)  Distance Ambulated (ft) 500 ft  Activity Response Tolerated well  Mobility Referral Yes  $Mobility charge 1 Mobility   Pt received in bed and agreeable to mobility. No complaints during session. Pt to recliner after session with all needs met.  [facility]

## 2022-08-22 NOTE — Plan of Care (Signed)
  Problem: Education: Goal: Knowledge of General Education information will improve Description: Including pain rating scale, medication(s)/side effects and non-pharmacologic comfort measures Outcome: Adequate for Discharge   Problem: Health Behavior/Discharge Planning: Goal: Ability to manage health-related needs will improve Outcome: Adequate for Discharge   Problem: Clinical Measurements: Goal: Ability to maintain clinical measurements within normal limits will improve Outcome: Adequate for Discharge Goal: Will remain free from infection Outcome: Adequate for Discharge Goal: Diagnostic test results will improve Outcome: Adequate for Discharge Goal: Respiratory complications will improve Outcome: Adequate for Discharge Goal: Cardiovascular complication will be avoided Outcome: Adequate for Discharge   Problem: Activity: Goal: Risk for activity intolerance will decrease Outcome: Adequate for Discharge   Problem: Nutrition: Goal: Adequate nutrition will be maintained Outcome: Adequate for Discharge   Problem: Coping: Goal: Level of anxiety will decrease Outcome: Adequate for Discharge   Problem: Elimination: Goal: Will not experience complications related to bowel motility Outcome: Adequate for Discharge Goal: Will not experience complications related to urinary retention Outcome: Adequate for Discharge   Problem: Pain Managment: Goal: General experience of comfort will improve Outcome: Adequate for Discharge   Problem: Safety: Goal: Ability to remain free from injury will improve Outcome: Adequate for Discharge   Problem: Skin Integrity: Goal: Risk for impaired skin integrity will decrease Outcome: Adequate for Discharge   Problem: Education: Goal: Knowledge of the prescribed therapeutic regimen will improve Outcome: Adequate for Discharge Goal: Understanding of sexual limitations or changes related to disease process or condition will improve Outcome: Adequate  for Discharge Goal: Individualized Educational Video(s) Outcome: Adequate for Discharge   Problem: Self-Concept: Goal: Communication of feelings regarding changes in body function or appearance will improve Outcome: Adequate for Discharge   Problem: Skin Integrity: Goal: Demonstration of wound healing without infection will improve Outcome: Adequate for Discharge   

## 2022-08-22 NOTE — Discharge Summary (Signed)
Physician Discharge Summary  Patient ID: [name] MRN: [id number] DOB/AGE: 07/18/1955 67 y.o.  Admit date: 08/16/2022 Discharge date: 08/22/2022  Admission Diagnoses: Gynecologic malignancy  Discharge Diagnoses:  Principal Problem:   Gynecologic malignancy   Discharged Condition:  The patient is in good condition and stable for discharge.    Hospital Course: On 08/16/2022, the patient underwent the following: Procedure(s): LAPAROSCOPY DIAGNOSTIC, XI ROBOTIC ASSISTED TOTAL HYSTERECTOMY BILATERAL SALPINGO OOPHORECTOMY WITH OMENTECTOMY AND DEBULKING VERSUS OPEN HYSTERECTOMY ABDOMINAL WITH SALPINGO-OOPHORECTOMY CYSTOSCOPY COLON RESECTION SIGMOID. The postoperative course was uneventful.  She was discharged to home on postoperative day 6 tolerating a regular diet, having bowel movements and passing flatus, pain controlled with chronic pain regimen, ambulating without difficulty, voiding, labs stable, JP removed by RN at discharge.   Consults: None  Significant Diagnostic Studies: Labs  Treatments: surgery: see above  Discharge Exam: Blood pressure 117/73, pulse 86, temperature 98.3 F (36.8 C), temperature source Oral, resp. rate 15, height  (1.753 m), weight 175 lb (79.4 kg), SpO2 100 %. General appearance: alert, cooperative, and no distress Resp: clear to auscultation bilaterally Cardio: regular rate and rhythm, S1, S2 normal, no murmur, click, rub or gallop GI: active bowel sounds, more than am assessment, mildly distended, soft, non tender  Extremities: extremities normal, atraumatic, no cyanosis or edema Incision/Wound: midline abdominal incision with op site dressing in place with staples with no drainage noted underneath, lap sites intact with dermabond  JP with minimal amount of serosanguinous drainage  Disposition: Discharge disposition: 01-Home or Self Care       Discharge Instructions     Call MD for:  difficulty breathing, headache or visual  disturbances   Complete by: As directed    Call MD for:  extreme fatigue   Complete by: As directed    Call MD for:  hives   Complete by: As directed    Call MD for:  persistant dizziness or light-headedness   Complete by: As directed    Call MD for:  persistant nausea and vomiting   Complete by: As directed    Call MD for:  redness, tenderness, or signs of infection (pain, swelling, redness, odor or green/yellow discharge around incision site)   Complete by: As directed    Call MD for:  severe uncontrolled pain   Complete by: As directed    Call MD for:  temperature >100.4   Complete by: As directed    Change dressing   Complete by: As directed    Remove op site dressing. Can leave open to air. Place dry dressing over JP drain site   Diet - low sodium heart healthy   Complete by: As directed    Driving Restrictions   Complete by: As directed    No driving for 2 week(s).  Do not take narcotics and drive. You need to make sure your reaction time has returned.   Increase activity slowly   Complete by: As directed    Lifting restrictions   Complete by: As directed    No lifting greater than 10 lbs, pushing, pulling, straining for 6 weeks.   Sexual Activity Restrictions   Complete by: As directed    No sexual activity, nothing in the vagina, for 10-12 weeks.      Allergies as of 08/22/2022       Reactions   Cymbalta [duloxetine Hcl] Shortness Of Breath, Swelling, Other (See Comments)   Tongue and leg swelling   Cyclobenzaprine Hcl Other (See Comments)  Immobility   Celexa  [citalopram Hydrobromide] Swelling, Other (See Comments)   Tongue swelling   Fluticasone Furoate-vilanterol Swelling, Other (See Comments)   BREO ELLIPTA- Tongue swelling, but breathing not affected   Gabapentin Other (See Comments)   Feels awful when taking, drowsy    Opana  [oxymorphone Hcl] Itching, Other (See Comments)   Loss of hair, also   Tramadol Other (See Comments)   Dizziness         Medication List     STOP taking these medications    metroNIDAZOLE 500 MG tablet Commonly known as: FLAGYL   TURMERIC PO       TAKE these medications    albuterol 108 (90 Base) MCG/ACT inhaler Commonly known as: VENTOLIN HFA Inhale 2 puffs into the lungs every 6 (six) hours as needed for shortness of breath.   amphetamine-dextroamphetamine 20 MG tablet Commonly known as: ADDERALL Take 10-20 mg by mouth See admin instructions. Take 20 mg by mouth in the morning and 10 mg between 1-2 PM daily   apixaban 2.5 MG Tabs tablet Commonly known as: ELIQUIS Take 1 tablet (2.5 mg total) by mouth 2 (two) times daily. Start taking on: August 23, 2022   ASHWAGANDHA PO Take 1 capsule by mouth daily.   BLACK CURRANT SEED OIL PO Take 1 capsule by mouth daily.   dexamethasone 4 MG tablet Commonly known as: DECADRON Take 2 tabs at the night before and 2 tab the morning of chemotherapy, every 3 weeks, by mouth x 6 cycles   diclofenac Sodium 1 % Gel Commonly known as: VOLTAREN Apply 2 g topically 2 (two) times daily.   irbesartan 300 MG tablet Commonly known as: AVAPRO Take 300 mg by mouth daily.   lidocaine-prilocaine cream Commonly known as: EMLA Apply to affected area once What changed:  how much to take how to take this when to take this reasons to take this   LORazepam 0.5 MG tablet Commonly known as: ATIVAN Take 0.5 mg by mouth 3 (three) times daily as needed for anxiety.   magic mouthwash w/lidocaine Soln Take 5 mLs by mouth 4 (four) times daily. Take 5 mls 4 x day for 7 days, swish and swallow.   meclizine 12.5 MG tablet Commonly known as: ANTIVERT Take 12.5 mg by mouth 3 (three) times daily as needed for dizziness.   Melatonin 10 MG Tabs Take 10 mg by mouth at bedtime as needed (sleep).   multivitamin with minerals Tabs tablet Take 1 tablet by mouth daily.   mupirocin ointment 2 % Commonly known as: BACTROBAN Apply 1 Application topically 3 (three) times  daily as needed (HS flares).   Narcan 4 MG/0.1ML Liqd nasal spray kit Generic drug: naloxone Place 1 spray into the nose as needed (opioid overdose).   ondansetron 8 MG tablet Commonly known as: ZOFRAN Take 8 mg by mouth every 8 (eight) hours as needed for nausea or vomiting.   OVER THE COUNTER MEDICATION Take 2 capsules by mouth daily. Sea Moss advanced supplement   Oxycodone HCl 20 MG Tabs Take 20 mg by mouth 5 (five) times daily as needed (pain).   polyethylene glycol 17 g packet Commonly known as: MIRALAX / GLYCOLAX Take 17 g by mouth daily.   predniSONE 20 MG tablet Commonly known as: DELTASONE Take 20 mg by mouth daily as needed (joint pain). May take up to 3 days at a time   prochlorperazine 10 MG tablet Commonly known as: COMPAZINE Take 10 mg by mouth every 6 (  six) hours as needed for nausea or vomiting.   Vitamin A 2400 MCG (8000 UT) Caps Take 2,400 mcg by mouth daily.   vitamin C 1000 MG tablet Take 1,000 mg by mouth daily.   Vitamin D (Ergocalciferol) 1.25 MG (50000 UNIT) Caps capsule Commonly known as: DRISDOL Take 50,000 Units by mouth every Sunday.   Wixela Inhub 250-50 MCG/ACT Aepb Generic drug: fluticasone-salmeterol Inhale 1 puff into the lungs daily.   Xtampza ER 13.5 MG C12a Generic drug: oxyCODONE ER Take 13.5 mg by mouth in the morning, at noon, and at bedtime.   zinc gluconate 50 MG tablet Take 50 mg by mouth daily.               Discharge Care Instructions  (From admission, onward)           Start     Ordered   08/22/22 0000  Change dressing       Comments: Remove op site dressing. Can leave open to air. Place dry dressing over JP drain site   08/22/22 1626            Follow-up Information     Carver Fila, MD Follow up on 08/23/2022.   Specialty: Gynecologic Oncology Why: around 4:20pm will be a PHONE visit to check in and discuss pathology. This appt may be adjusted if your pathology is not back. IN PERSON  visit will be on 09/14/2022 at 2:45pm at the [facility]. Contact information: [address] [place] [address] [phone]         [facility] Gynecological Oncology Follow up on 08/28/2022.   Specialty: Gynecologic Oncology Why: at 1 pm at the [facility] for staple removal Contact information: [address] [id number] [place] [phone]                Greater than thirty minutes were spend for face to face discharge instructions and discharge orders/summary in EPIC.   Signed: Doylene Bode 08/22/2022, 4:48 PM

## 2022-08-22 NOTE — Progress Notes (Signed)
Gynecologic Oncology Progress Note  6 Days Post-Op Procedure(s) (LRB): LAPAROSCOPY DIAGNOSTIC (N/A) XI ROBOTIC ASSISTED TOTAL HYSTERECTOMY BILATERAL SALPINGO OOPHORECTOMY WITH OMENTECTOMY AND DEBULKING VERSUS OPEN (Bilateral) HYSTERECTOMY ABDOMINAL WITH SALPINGO-OOPHORECTOMY CYSTOSCOPY (N/A) COLON RESECTION SIGMOID  Subjective: Patient reports feelings of needing to have a bowel movement this morning with no results yet.  She is continuing to pass flatus.  She states that she had a good bowel movement yesterday.  Tolerating diet with no nausea or emesis.  Feels slightly unsteady this morning when ambulating.  She states she woke up with a low-grade fever this morning.  When her temperature was initially checked it was 100 and then on repeat 99.1.  She states she could tell that she was having a low-grade fever due to sweating.  This morning she also has a new left-sided sharp abdominal pain.  She feels this may be related to gas pains.  She is emptying her bladder without difficulty and denies dysuria.  She is slightly concerned about the increase in the amount of drainage from the JP drain.  She denies chest pain or dyspnea.  Objective: Vital signs in last 24 hours: Temp:  [98.1 F (36.7 C)-99.1 F (37.3 C)] 99.1 F (37.3 C) (04/16 0435) Pulse Rate:  [80-92] 82 (04/16 0435) Resp:  [16-20] 20 (04/16 0435) BP: (117-149)/(84-88) 126/87 (04/16 0435) SpO2:  [100 %] 100 % (04/16 0435) Last BM Date : 08/21/22  Intake/Output from previous day: 04/15 0701 - 04/16 0700 In: 1200 [P.O.:1200] Out: 2912 [Urine:2881; Drains:30; Stool:1]  Physical Examination: General: alert, cooperative, and no distress Resp: clear to auscultation bilaterally Cardio: regular rate and rhythm, S1, S2 normal, no murmur, click, rub or gallop GI: incision: midline abdominal incision with op site dressing in place with staples with no drainage noted underneath, lap sites intact with dermabond and abdomen soft, less  active bowel sounds compared with 4/15 assessment, slightly distended compared to 4/15 assessment Extremities: extremities normal, atraumatic, no cyanosis or edema JP drain with serosanguinous drainage. Drain stripped. No issues visualized with drain, adequate suction present.  Labs: Ordered for this am. Last on 4/15  Assessment: 67 y.o. s/p Procedure(s): LAPAROSCOPY DIAGNOSTIC XI ROBOTIC ASSISTED TOTAL HYSTERECTOMY BILATERAL SALPINGO OOPHORECTOMY WITH OMENTECTOMY AND DEBULKING VERSUS OPEN HYSTERECTOMY ABDOMINAL WITH SALPINGO-OOPHORECTOMY, CYSTOSCOPY, COLON RESECTION SIGMOID: stable  Pain:  Pain is well-controlled on PRN medications. Chronic pain medication regimen has been ordered.  Heme: Hgb 10.6 and Hct 31.3 4/15 am. Given 2 units pRBCs intra-op. Surgical EBL around 1100 cc.   ID: WBC 5.1 4/15 am. Plan for repeat given increased temp, pt symptomatic. Given decadron intra-op along with IV antibiotic. No evidence of infection at this time.  CV: BP elevated at times. Avapro ordered.   GI:  Tolerating po: yes. Antiemetics ordered if needed.  GU: Adequate urine documented overnight. Creatinine 0.76 08/21/22 am.    FEN: No critical values on am labs.  Endo: Hx prediabetes. Random glucose on Bmet 133 08/21/22 am.  Prophylaxis: SCDs. Lovenox ordered.  Plan: Plan for labs this am  Diet as tolerated Encourage increasing mobility, IS use Continue plan of care per Dr. Pricilla Holm   LOS: 6 days    Doni Widmer D Billey Wojciak 08/22/2022, 8:25 AM

## 2022-08-22 NOTE — Progress Notes (Signed)
Mobility Specialist - Progress Note   08/22/22 0947  Mobility  Activity Ambulated with assistance in hallway  Level of Assistance Independent after set-up  Assistive Device Other (Comment) (IV Pole)  Distance Ambulated (ft) 500 ft  Activity Response Tolerated well  Mobility Referral Yes  $Mobility charge 1 Mobility   Pt received in room standing after using the bathroom & attempting a BM. Pt was agreeable to mobility. Upon returning to room c/o feeling "winded". No other complaints during session. Pt to recliner after session with all needs met.    [facility]

## 2022-08-22 NOTE — Progress Notes (Signed)
Discharge teaching done with patient. JP drained removed. Sharrell Ku RN

## 2022-08-23 ENCOUNTER — Other Ambulatory Visit: Payer: Self-pay

## 2022-08-23 ENCOUNTER — Inpatient Hospital Stay: Payer: 59 | Admitting: Gynecologic Oncology

## 2022-08-23 ENCOUNTER — Telehealth: Payer: Self-pay

## 2022-08-23 NOTE — Telephone Encounter (Signed)
Spoke with [name] this morning. She states she is eating, drinking and urinating well. She has not had a BM yet but is passing gas. She is taking senokot as prescribed and encouraged her to drink plenty of water. She denies fever or chills. Incisions are dry and intact. She rates her pain 5/10. Her pain is controlled with Tylenol and Oxycodone 20mg     Instructed to call office with any fever, chills, purulent drainage, uncontrolled pain or any other questions or concerns. Patient verbalizes understanding.   Pt aware of post op appointments as well as the office number [phone] and after hours number [phone] to call if she has any questions or concerns

## 2022-08-24 ENCOUNTER — Other Ambulatory Visit: Payer: Self-pay | Admitting: Hematology and Oncology

## 2022-08-24 ENCOUNTER — Telehealth: Payer: Self-pay | Admitting: Hematology and Oncology

## 2022-08-24 NOTE — Telephone Encounter (Signed)
Spoke with patient confirming upcoming appointments  

## 2022-08-25 ENCOUNTER — Other Ambulatory Visit: Payer: Self-pay

## 2022-08-25 LAB — SURGICAL PATHOLOGY

## 2022-08-28 ENCOUNTER — Encounter: Payer: Self-pay | Admitting: Oncology

## 2022-08-28 ENCOUNTER — Other Ambulatory Visit: Payer: Self-pay

## 2022-08-28 ENCOUNTER — Encounter: Payer: Self-pay | Admitting: Hematology and Oncology

## 2022-08-28 ENCOUNTER — Inpatient Hospital Stay (HOSPITAL_BASED_OUTPATIENT_CLINIC_OR_DEPARTMENT_OTHER): Payer: 59 | Admitting: Gynecologic Oncology

## 2022-08-28 ENCOUNTER — Telehealth: Payer: Self-pay | Admitting: Oncology

## 2022-08-28 ENCOUNTER — Inpatient Hospital Stay: Payer: 59 | Attending: Gynecologic Oncology

## 2022-08-28 ENCOUNTER — Encounter: Payer: Self-pay | Admitting: Gynecologic Oncology

## 2022-08-28 VITALS — BP 156/82 | HR 88 | Temp 98.1°F | Wt 170.0 lb

## 2022-08-28 DIAGNOSIS — Z90722 Acquired absence of ovaries, bilateral: Secondary | ICD-10-CM | POA: Diagnosis not present

## 2022-08-28 DIAGNOSIS — C563 Malignant neoplasm of bilateral ovaries: Secondary | ICD-10-CM

## 2022-08-28 DIAGNOSIS — C786 Secondary malignant neoplasm of retroperitoneum and peritoneum: Secondary | ICD-10-CM | POA: Diagnosis not present

## 2022-08-28 DIAGNOSIS — Z90710 Acquired absence of both cervix and uterus: Secondary | ICD-10-CM | POA: Insufficient documentation

## 2022-08-28 DIAGNOSIS — Z9079 Acquired absence of other genital organ(s): Secondary | ICD-10-CM | POA: Insufficient documentation

## 2022-08-28 DIAGNOSIS — C5700 Malignant neoplasm of unspecified fallopian tube: Secondary | ICD-10-CM

## 2022-08-28 DIAGNOSIS — Z7189 Other specified counseling: Secondary | ICD-10-CM

## 2022-08-28 NOTE — Telephone Encounter (Signed)
Called [facility] and scheduled genetic counseling for 09/05/2022 at 2:00.  She verbalized understanding of appointment.

## 2022-08-28 NOTE — Patient Instructions (Addendum)
Hello, it was good to see you today. Today, we removed the staples from your incision. Your incision looks great and is healing as it should. You can shower and get it wet. Pat to dry. Do not apply any ointments, creams or lotions to your incision. Your steri-strips will fall off on their own, within a week or two. Do not peel them off. Monitor for any redness, swelling, drainage, or separation of your incision and call our office at [phone] if this occurs.

## 2022-08-28 NOTE — Progress Notes (Signed)
Patient came in today for staple removal. All staples removed and patient tolerated the procedure well. Incision site clean, dry, and intact. Betadine and steri-strips applied to site. Patient advised that she can shower and get the area wet, and to pat dry. Advised not to use any lotions, ointments, or creams on incision site and to call our office if there are any signs of redness, swelling, drainage, or separation of her incision. Patient verbalized understanding and had no concerns at this time.

## 2022-08-28 NOTE — Progress Notes (Signed)
Referral placed for genetic counseling.

## 2022-08-28 NOTE — Progress Notes (Signed)
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  Margins of this mass are irregular.  There is small fluid in the cul-de-sac as well as fullness of soft tissue about the right adnexa.  Discrete uterine structure is not identified.  Left upper quadrant nodules beneath the left hemidiaphragm (1 anterior to the spleen and the other to the stomach).  Omental/peritoneal nodularity and multiple perihepatic nodules are noted    04/18/2022 Tumor Marker   Patient's tumor was tested for the following markers: CA-125. Results of the tumor marker test revealed 123.   04/19/2022 Initial Diagnosis   Carcinomatosis (HCC)   04/28/2022 Imaging   1. Bulky bilateral ovarian masses and nodules, left-greater-than-right, which are essentially confluent with adjacent pelvic soft tissue nodularity and not significantly changed compared to prior examination dated 04/11/2022. 2. Extensive pelvic peritoneal thickening and nodularity. Multiple small peritoneal nodules throughout the abdomen and pelvis. Findings are consistent with peritoneal metastatic disease and likewise not significantly changed. 3. Small volume of loculated appearing fluid in the low pelvis. 4. 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 The mid to distal right ureter is obstructed by right ovarian mass and or soft tissue nodularity. 5. Calculus within the most distal left ureterovesicular junction or just within the bladder lumen measuring 0.7 cm, unchanged. No associated left-sided hydronephrosis. 6. Soft tissue attenuation nodule of the body of the  right adrenal gland. Notably, this was also soft tissue attenuation on prior noncontrast examination (i.e. not definitively macroscopic fat containing) although unchanged compared to prior examinations dating back to 2018 and almost certainly a benign adenoma. Attention on follow-up. 7. No evidence of metastatic disease in the chest. 8. Mild diffuse bilateral bronchial wall thickening. Background of very fine centrilobular nodularity, most concentrated in the lung apices. Findings are most consistent with smoking-related respiratory bronchiolitis.     05/18/2022 Procedure   Ultrasound-guided core biopsy performed of a 10 mm soft tissue peritoneal mass in the right upper quadrant just superficial to the right lobe of the liver. The procedure was performed under general anesthesia immediately following Port-A-Cath placement.   05/18/2022 Procedure   Placement of single lumen port a cath via right internal jugular vein. The catheter tip lies at the cavo-atrial junction. A power injectable port a cath was placed and is ready for immediate use.     05/18/2022 Pathology Results   A. 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  The findings support the above interpretation.  This case was reviewed with Dr. Kenard Gower who agrees with the above diagnosis.  A p16 stain is pending and will be reported in an addendum.    05/23/2022 Cancer Staging   Staging form: Ovary, Fallopian Tube, and Primary Peritoneal Carcinoma, AJCC 8th Edition - Clinical stage from 05/23/2022: FIGO Stage IIIC (cT3c, cN0, cM0) - Signed by Artis Delay, MD on 05/23/2022 Stage prefix: Initial diagnosis   05/29/2022 Imaging   1. Complex bilateral adnexal masses, as described above, consistent with the patient's known malignancy. 2. Subsequent  marked severity mass effect on the  distal bilateral common iliac veins, increased in severity when compared to the prior exam. 3. Additional findings consistent with peritoneal metastasis within the pelvis. 4. Findings consistent with pelvic congestion syndrome. 5. Postoperative changes within the mid and lower lumbar spine. 6. Aortic atherosclerosis. 7. No evidence of venous thrombus within the abdomen, pelvis or proximal bilateral lower extremities.   Aortic Atherosclerosis (ICD10-I70.0).   06/02/2022 -  Chemotherapy   Patient is on Treatment Plan : OVARIAN Carboplatin (AUC 6) + Paclitaxel (175) q21d X 6 Cycles     06/26/2022 Tumor Marker   Patient's tumor was tested for the following markers: CA-125. Results of the tumor marker test revealed 134.   08/04/2022 Imaging   CT ABDOMEN PELVIS W CONTRAST  Result Date: 08/04/2022 CLINICAL DATA:  Ovarian cancer, chemotherapy in progress, for restaging EXAM: CT ABDOMEN AND PELVIS WITH CONTRAST TECHNIQUE: Multidetector CT imaging of the abdomen and pelvis was performed using the standard protocol following bolus administration of intravenous contrast. RADIATION DOSE REDUCTION: This exam was performed according to the departmental dose-optimization program which includes automated exposure control, adjustment of the mA and/or kV according to patient size and/or use of iterative reconstruction technique. CONTRAST:  OMNIPAQUE IOHEXOL 300 MG/ML  SOLN COMPARISON:  CT venogram abdomen/pelvis dated 05/29/2022. CT abdomen/pelvis dated 04/26/2022. FINDINGS: Lower chest: Lung bases are clear. Hepatobiliary: Liver is within normal limits. Gallbladder is unremarkable. No intrahepatic or extrahepatic ductal dilatation. Pancreas: Within normal limits. Spleen: Within normal limits. Adrenals/Urinary Tract: 2.4 cm right adrenal nodule (series 2/image 17), previously 2.0 cm in 2018, likely reflecting a benign adrenal adenoma. Left adrenal gland is within normal limits. Left kidney is within normal  limits. 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 Associated extrinsic compression at the level of the right adnexal mass (described below). Mildly thick-walled bladder, although underdistended. Stable 6 mm left UVJ calcification (series 2/image 68). Stomach/Bowel: Stomach is within normal limits. No evidence of bowel obstruction. Normal appendix (series 2/image 47). Left adnexal mass (described below) is favored to abut and directly invade the left lateral wall of the sigmoid colon (series 2/image 57). Vascular/Lymphatic: No evidence of abdominal aortic aneurysm. Atherosclerotic calcifications of the abdominal aorta and branch vessels. No suspicious abdominopelvic lymphadenopathy. Reproductive: Heterogeneous uterus with associated surface nodularity (series 2/image 65), likely reflecting peritoneal disease. This appearance is improved from the prior. 3.9 x 3.3 cm left adnexal mass (series 2/image 60), previously 5.3 x 3.9 cm, with extension along the sigmoid mesocolon (series 2/image 56) and suspected involvement of the sigmoid colon (series 2/image 57). 5.6 x 2.7 cm mixed cystic/solid right adnexal mass (series 2/image 89), previously 6.9 x 4.3 cm when measured in a similar fashion. Other: No abdominopelvic ascites. Scattered minimal peritoneal nodularity beneath the anterior abdominal wall, including a 5 mm nodule beneath the left anterior abdominal wall (series 2/image 27) which previously measured 12 mm. Additional mild nodularity on series 2/images 32, 33, and 50. Musculoskeletal: Status post PLIF at L4-S1. Status post lateral fixation at L4-5. Status post anterior fixation at L5-S1. Mild degenerative changes of the lower thoracic spine. IMPRESSION: Improving bilateral adnexa masses in this patient with known ovarian cancer, as above. Suspected involvement of the sigmoid colon. No evidence of bowel obstruction.  Improving peritoneal nodularity, as above. Additional ancillary findings as above. Electronically Signed   By: Charline Bills M.D.   On: 08/04/2022 01:27      08/16/2022 Pathology  Results   CASE: [id number] PATIENT: Donnamae Jude Surgical Pathology Report  Clinical History: Advanced gyn malignancy (crm)  FINAL MICROSCOPIC DIAGNOSIS:  A. OMENTUM, RESECTION: - Positive for carcinoma  B. LIVER ADHESION, EXCISION: - Benign fibroadipose tissue - Negative for carcinoma  C. SMALL BOWEL MESENTERIC IMPLANT, EXCISION: - Positive for carcinoma with extensive necrosis  D. UTERUS, CERVIX, BILATERAL FALLOPIAN TUBES AND OVARIES, AND APPENDIX, RESECTION: - Uterine serosa: Tumor deposits present - Cervix: Benign, nabothian cyst - Endometrium: Benign inactive endometrium, benign endometrial polyp - Myometrium: Leiomyoma - Right fallopian tube: Metastatic tumor deposits present - Right ovary: High-grade serous carcinoma - Left ovary: High-grade serous carcinoma - Benign appendix - See oncology table  E. COLON, RECTOSIGMOID, RESECTION: - Benign colonic mucosa - Margins free of carcinoma - Adherent fallopian tube with metastatic tumor deposits - 2 benign lymph nodes  F. RECTAL TUMOR IMPLANT, EXCISION: - Metastatic high-grade serous carcinoma   G. COLON, RECTOSIGMOID ANASTOMOTIC RINGS: - Benign anastomotic donuts  ONCOLOGY TABLE:   OVARY or FALLOPIAN TUBE or PRIMARY PERITONEUM: Resection  Procedure: Total abdominal hysterectomy, bilateral salpingo-oophorectomy, appendectomy, rectosigmoid resection, total omentectomy, small bowel nodule resection Specimen Integrity: Intact Tumor Site: Bilateral Tumor Size: Right ovary: 3.9 cm, left ovary: Approximately 1.2 cm Histologic Type: High-grade serous carcinoma Histologic Grade: High-grade Ovarian Surface Involvement: Present Fallopian Tube Surface Involvement: Present Implants: Present, small bowel mesenteric and rectovaginal  septum Lymphatic and/or Vascular Invasion: Not identified Other Tissue/ Organ Involvement: Fallopian tube, bilateral Largest Extrapelvic Peritoneal Focus: 1.4 cm, small bowel mesentery Peritoneal/Ascitic Fluid Involvement: Not applicable Chemotherapy Response Score (CRS): Not applicable, no known presurgical therapy Regional Lymph Nodes: Not applicable (no lymph nodes submitted or found) Distant Metastasis:      Distant Site(s) Involved: Not applicable Pathologic Stage Classification (pTNM, AJCC 8th Edition): pT3b, pN[not assigned] Ancillary Studies: Can be performed upon request Representative Tumor Block: D8 Comment(s): Tumor is staged as pT3 given the presence of extrapelvic peritoneal deposits on small bowel mesentery less than 2cm (1.4 cm) (v1.3.0.1)      Interval History: Patient reports doing very well.  She presented to clinic for staple removal Sor phone visit was done in person.  She endorses tolerating intake without nausea or emesis.  Is eating small meals.  Endorses flatus as well as bowel movements, more recently has been using MiraLAX.  Denies any urinary symptoms.  Denies any vaginal bleeding or discharge.  Some discomfort, no significant abdominal pain.  Feels better each day.  Past Medical/Surgical History: Past Medical History:  Diagnosis Date   Anxiety    Asthma    Back problem    disc disease, chronic low back pain, right leg pain, s/p discectomy 99   Cancer    ovarian cancer   Depression    Diastolic dysfunction    with elevated LVEDP at cath   Endometriosis    Fibroids    hx of    GERD (gastroesophageal reflux disease)    Heart murmur    Hepatitis C    treated 8-9 years ago   Hidradenitis suppurativa    History of kidney stones    Hypertension    MS (multiple sclerosis)    Neuromuscular disorder    Mulitple sclerosis   Osteoarthritis    Pre-diabetes    Prediabetes    RA (rheumatoid arthritis)    Rotator cuff tear    right shoulder     Syncope     Past Surgical History:  Procedure Laterality Date   ANGIOPLASTY  BACK SURGERY     lumbar surgery x 2 done in [place] and [place]   COLON RESECTION SIGMOID  08/16/2022   Procedure: COLON RESECTION SIGMOID;  Surgeon: Carver Fila, MD;  Location: [facility];  Service: Gynecology;;   COLONOSCOPY  07/2010   tubular adenoma   COLONOSCOPY  08/2015   CYSTOSCOPY N/A 08/16/2022   Procedure: CYSTOSCOPY;  Surgeon: Carver Fila, MD;  Location: [facility];  Service: Gynecology;  Laterality: N/A;   DISKECTOMY     HYSTERECTOMY ABDOMINAL WITH SALPINGO-OOPHORECTOMY  08/16/2022   Procedure: HYSTERECTOMY ABDOMINAL WITH SALPINGO-OOPHORECTOMY;  Surgeon: Carver Fila, MD;  Location: [facility];  Service: Gynecology;;   IR IMAGING GUIDED PORT INSERTION  05/18/2022   IR US GUIDE BX ASP/DRAIN  05/18/2022   LAPAROSCOPY N/A 08/16/2022   Procedure: LAPAROSCOPY DIAGNOSTIC;  Surgeon: Carver Fila, MD;  Location: [facility];  Service: Gynecology;  Laterality: N/A;   laparoscopy for endometriosis     left shoulder scope     RADIOLOGY WITH ANESTHESIA N/A 05/18/2022   Procedure: IR WITH ANESTHESIA PORT AND BIOPSY;  Surgeon: Irish Lack, MD;  Location: [facility];  Service: Radiology;  Laterality: N/A;   ROTATOR CUFF REPAIR Left    Spinal Fusion  12/2016   L4-S1    Family History  Problem Relation Age of Onset   Heart disease Mother        Died with MI 15, chest pain 30s   Hypertension Mother    Pancreatitis Father    Atrial fibrillation Sister    Breast cancer Maternal Aunt    Multiple sclerosis Cousin     Social History   Socioeconomic History   Marital status: Single    Spouse name: Not on file   Number of children: 1   Years of education: Not on file   Highest education level: Not on file  Occupational History   Not on file  Tobacco Use   Smoking status: Some Days    Packs/day: 0.10    Years: 27.00    Additional pack years: 0.00    Total pack years: 2.70    Types:  Cigarettes    Last attempt to quit: 2023    Years since quitting: 1.3   Smokeless tobacco: Never  Vaping Use   Vaping Use: Never used  Substance and Sexual Activity   Alcohol use: Yes    Comment: Occasional    Drug use: Yes    Types: Marijuana   Sexual activity: Yes  Other Topics Concern   Not on file  Social History Narrative   Lives alone   Right handed   Caffeine: none    Social Determinants of Health   Financial Resource Strain: Not on file  Food Insecurity: No Food Insecurity (08/17/2022)   Hunger Vital Sign    Worried About Running Out of Food in the Last Year: Never true    Ran Out of Food in the Last Year: Never true  Transportation Needs: No Transportation Needs (08/17/2022)   PRAPARE - Administrator, Civil Service (Medical): No    Lack of Transportation (Non-Medical): No  Physical Activity: Not on file  Stress: Not on file  Social Connections: Not on file    Current Medications:  Current Outpatient Medications:    albuterol (VENTOLIN HFA) 108 (90 Base) MCG/ACT inhaler, Inhale 2 puffs into the lungs every 6 (six) hours as needed for shortness of breath., Disp: , Rfl:    amphetamine-dextroamphetamine (ADDERALL) 20  MG tablet, Take 10-20 mg by mouth See admin instructions. Take 20 mg by mouth in the morning and 10 mg between 1-2 PM daily, Disp: , Rfl:    apixaban (ELIQUIS) 2.5 MG TABS tablet, Take 1 tablet (2.5 mg total) by mouth 2 (two) times daily., Disp: 60 tablet, Rfl: 11   Ascorbic Acid (VITAMIN C) 1000 MG tablet, Take 1,000 mg by mouth daily., Disp: , Rfl:    ASHWAGANDHA PO, Take 1 capsule by mouth daily., Disp: , Rfl:    BLACK CURRANT SEED OIL PO, Take 1 capsule by mouth daily., Disp: , Rfl:    dexamethasone (DECADRON) 4 MG tablet, Take 2 tabs at the night before and 2 tab the morning of chemotherapy, every 3 weeks, by mouth x 6 cycles, Disp: 24 tablet, Rfl: 6   diclofenac Sodium (VOLTAREN) 1 % GEL, Apply 2 g topically 2 (two) times daily., Disp: ,  Rfl:    docusate sodium (COLACE) 100 MG capsule, Take 1 capsule (100 mg total) by mouth 2 (two) times daily. Do not take if having diarrhea, Disp: 30 capsule, Rfl: 0   irbesartan (AVAPRO) 300 MG tablet, Take 300 mg by mouth daily., Disp: , Rfl: 3   lidocaine-prilocaine (EMLA) cream, Apply to affected area once (Patient taking differently: Apply 1 Application topically daily as needed (port access). Apply to affected area once), Disp: 30 g, Rfl: 3   LORazepam (ATIVAN) 0.5 MG tablet, Take 0.5 mg by mouth 3 (three) times daily as needed for anxiety., Disp: , Rfl:    magic mouthwash w/lidocaine SOLN, Take 5 mLs by mouth 4 (four) times daily. Take 5 mls 4 x day for 7 days, swish and swallow., Disp: 160 mL, Rfl: 0   meclizine (ANTIVERT) 12.5 MG tablet, Take 12.5 mg by mouth 3 (three) times daily as needed for dizziness., Disp: , Rfl:    Melatonin 10 MG TABS, Take 10 mg by mouth at bedtime as needed (sleep)., Disp: , Rfl:    Multiple Vitamin (MULTIVITAMIN WITH MINERALS) TABS tablet, Take 1 tablet by mouth daily., Disp: , Rfl:    mupirocin ointment (BACTROBAN) 2 %, Apply 1 Application topically 3 (three) times daily as needed (HS flares)., Disp: , Rfl:    naloxone (NARCAN) nasal spray 4 mg/0.1 mL, Place 1 spray into the nose as needed (opioid overdose)., Disp: , Rfl:    ondansetron (ZOFRAN) 8 MG tablet, Take 8 mg by mouth every 8 (eight) hours as needed for nausea or vomiting., Disp: , Rfl:    OVER THE COUNTER MEDICATION, Take 2 capsules by mouth daily. [place] advanced supplement, Disp: , Rfl:    oxyCODONE ER (XTAMPZA ER) 13.5 MG C12A, Take 13.5 mg by mouth in the morning, at noon, and at bedtime., Disp: , Rfl:    Oxycodone HCl 20 MG TABS, Take 20 mg by mouth 5 (five) times daily as needed (pain)., Disp: , Rfl:    polyethylene glycol (MIRALAX / GLYCOLAX) 17 g packet, Take 17 g by mouth daily., Disp: , Rfl:    predniSONE (DELTASONE) 20 MG tablet, Take 20 mg by mouth daily as needed (joint pain). May take up  to 3 days at a time, Disp: , Rfl:    prochlorperazine (COMPAZINE) 10 MG tablet, Take 10 mg by mouth every 6 (six) hours as needed for nausea or vomiting., Disp: , Rfl:    Vitamin A 2400 MCG (8000 UT) CAPS, Take 2,400 mcg by mouth daily., Disp: , Rfl:    Vitamin D, Ergocalciferol, (  DRISDOL) 1.25 MG (50000 UNIT) CAPS capsule, Take 50,000 Units by mouth every Sunday., Disp: , Rfl:    WIXELA INHUB 250-50 MCG/ACT AEPB, Inhale 1 puff into the lungs daily., Disp: , Rfl:    zinc gluconate 50 MG tablet, Take 50 mg by mouth daily., Disp: , Rfl:   Review of Systems: Pertinent positives as per HPI.  Physical Exam: There were no vitals taken for this visit. General: Alert, oriented, no acute distress. Pulmonary: Unlabored breathing on room air.  Laboratory & Radiologic Studies: A. OMENTUM, RESECTION: - Positive for carcinoma  B. LIVER ADHESION, EXCISION: - Benign fibroadipose tissue - Negative for carcinoma  C. SMALL BOWEL MESENTERIC IMPLANT, EXCISION: - Positive for carcinoma with extensive necrosis  D. UTERUS, CERVIX, BILATERAL FALLOPIAN TUBES AND OVARIES, AND APPENDIX, RESECTION: - Uterine serosa: Tumor deposits present - Cervix: Benign, nabothian cyst - Endometrium: Benign inactive endometrium, benign endometrial polyp - Myometrium: Leiomyoma - Right fallopian tube: Metastatic tumor deposits present - Right ovary: High-grade serous carcinoma - Left ovary: High-grade serous carcinoma - Benign appendix - See oncology table  E. COLON, RECTOSIGMOID, RESECTION: - Benign colonic mucosa - Margins free of carcinoma - Adherent fallopian tube with metastatic tumor deposits - 2 benign lymph nodes  F. RECTAL TUMOR IMPLANT, EXCISION: - Metastatic high-grade serous carcinoma   G. COLON, RECTOSIGMOID ANASTOMOTIC RINGS: - Benign anastomotic donuts  ONCOLOGY TABLE:   OVARY or FALLOPIAN TUBE or PRIMARY PERITONEUM: Resection  Procedure: Total abdominal hysterectomy,  bilateral salpingo-oophorectomy, appendectomy, rectosigmoid resection, total omentectomy, small bowel nodule resection Specimen Integrity: Intact Tumor Site: Bilateral Tumor Size: Right ovary: 3.9 cm, left ovary: Approximately 1.2 cm Histologic Type: High-grade serous carcinoma Histologic Grade: High-grade Ovarian Surface Involvement: Present Fallopian Tube Surface Involvement: Present Implants: Present, small bowel mesenteric and rectovaginal septum Lymphatic and/or Vascular Invasion: Not identified Other Tissue/ Organ Involvement: Fallopian tube, bilateral Largest Extrapelvic Peritoneal Focus: 1.4 cm, small bowel mesentery Peritoneal/Ascitic Fluid Involvement: Not applicable Chemotherapy Response Score (CRS): Not applicable, no known presurgical therapy Regional Lymph Nodes: Not applicable (no lymph nodes submitted or found) Distant Metastasis:      Distant Site(s) Involved: Not applicable Pathologic Stage Classification (pTNM, AJCC 8th Edition): pT3b, pN[not assigned] Ancillary Studies: Can be performed upon request Representative Tumor Block: D8 Comment(s): Tumor is staged as pT3 given the presence of extrapelvic peritoneal deposits on small bowel mesentery less than 2cm (1.4 cm) (v1.3.0.1)   ADDENDUM:  - Upon additional review, a microscopic focus of serous tubal  intraepithelial carcinoma is identified in the fallopian tube.  This  finding along with the presence of residual normal ovarian parenchyma  fevers or tubal origin of the high-grade serous carcinoma.  The  diagnosis and staging is unchanged.  This case was reviewed with Dr.  Luisa Hart and Dr. Reynolds Bowl who agree with the above interpretation.   Assessment & Plan: [name] is a 67 y.o. 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  The patient is overall doing very well and meeting postoperative milestones.  Discussed stopping  MiraLAX and using stool softener with fiber as needed given recent colon resection.  Reviewed postoperative expectations and restrictions.  We discussed in detail her pathology report.  The patient was given a copy of the report today.  Based on pathology findings, it appears that this was a cancer that arose in her fallopian tube.  Discussed need for germline and genomic testing.  10 minutes of total  time was spent for this patient encounter, including preparation, face-to-face counseling with the patient and coordination of care, and documentation of the encounter.  Eugene Garnet, MD  Division of Gynecologic Oncology  Department of Obstetrics and Gynecology  [facility]

## 2022-09-01 ENCOUNTER — Encounter: Payer: Self-pay | Admitting: Hematology and Oncology

## 2022-09-05 ENCOUNTER — Inpatient Hospital Stay: Payer: 59 | Admitting: Genetic Counselor

## 2022-09-07 ENCOUNTER — Other Ambulatory Visit: Payer: Self-pay

## 2022-09-14 ENCOUNTER — Other Ambulatory Visit: Payer: Self-pay

## 2022-09-14 ENCOUNTER — Encounter: Payer: Self-pay | Admitting: Gynecologic Oncology

## 2022-09-14 ENCOUNTER — Inpatient Hospital Stay: Payer: 59 | Attending: Gynecologic Oncology | Admitting: Gynecologic Oncology

## 2022-09-14 VITALS — BP 135/93 | HR 92 | Temp 98.8°F | Ht 68.9 in | Wt 169.4 lb

## 2022-09-14 DIAGNOSIS — C5700 Malignant neoplasm of unspecified fallopian tube: Secondary | ICD-10-CM | POA: Insufficient documentation

## 2022-09-14 DIAGNOSIS — Z90722 Acquired absence of ovaries, bilateral: Secondary | ICD-10-CM

## 2022-09-14 DIAGNOSIS — C786 Secondary malignant neoplasm of retroperitoneum and peritoneum: Secondary | ICD-10-CM | POA: Insufficient documentation

## 2022-09-14 DIAGNOSIS — Z7189 Other specified counseling: Secondary | ICD-10-CM

## 2022-09-14 DIAGNOSIS — C785 Secondary malignant neoplasm of large intestine and rectum: Secondary | ICD-10-CM

## 2022-09-14 DIAGNOSIS — C784 Secondary malignant neoplasm of small intestine: Secondary | ICD-10-CM

## 2022-09-14 DIAGNOSIS — C563 Malignant neoplasm of bilateral ovaries: Secondary | ICD-10-CM

## 2022-09-14 DIAGNOSIS — Z90710 Acquired absence of both cervix and uterus: Secondary | ICD-10-CM

## 2022-09-14 DIAGNOSIS — C7982 Secondary malignant neoplasm of genital organs: Secondary | ICD-10-CM

## 2022-09-14 DIAGNOSIS — Z5111 Encounter for antineoplastic chemotherapy: Secondary | ICD-10-CM | POA: Insufficient documentation

## 2022-09-14 DIAGNOSIS — D701 Agranulocytosis secondary to cancer chemotherapy: Secondary | ICD-10-CM | POA: Insufficient documentation

## 2022-09-14 MED FILL — Fosaprepitant Dimeglumine For IV Infusion 150 MG (Base Eq): INTRAVENOUS | Qty: 5 | Status: AC

## 2022-09-14 MED FILL — Dexamethasone Sodium Phosphate Inj 100 MG/10ML: INTRAMUSCULAR | Qty: 1 | Status: AC

## 2022-09-14 NOTE — Patient Instructions (Signed)
It was good to see you today.  You are healing very well from surgery.  Remember, no heavy lifting for at least 6 weeks after surgery and nothing in the vagina for at least 10 weeks.  Please stop the fiber.  You can add a stool softener or MiraLAX if you need for bowel function.  We will have you see the genetic counselor to get genetic testing.  If this comes back negative, then we will send your tumor for special testing.  This helps [place] determine treatments that you might benefit from in the future.  As always, if you develop any new and concerning symptoms before your next visit, please call to see me sooner.

## 2022-09-14 NOTE — Progress Notes (Signed)
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  Margins of this mass are irregular.  There is small fluid in the cul-de-sac as well as fullness of soft tissue about the right adnexa.  Discrete uterine structure is not identified.  Left upper quadrant nodules beneath the left hemidiaphragm (1 anterior to the spleen and the other to the stomach).  Omental/peritoneal nodularity and multiple perihepatic nodules are noted    04/18/2022 Tumor Marker   Patient's tumor was tested for the following markers: CA-125. Results of the tumor marker test revealed 123.   04/19/2022 Initial Diagnosis   Carcinomatosis (HCC)   04/28/2022 Imaging   1. Bulky bilateral ovarian masses and nodules, left-greater-than-right, which are essentially confluent with adjacent pelvic soft tissue nodularity and not significantly changed compared to prior examination dated 04/11/2022. 2. Extensive pelvic peritoneal thickening and nodularity. Multiple small peritoneal nodules throughout the abdomen and pelvis. Findings are consistent with peritoneal metastatic disease and likewise not significantly changed. 3. Small volume of loculated appearing fluid in the low pelvis. 4. 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 The mid to distal right ureter is obstructed by right ovarian mass and or soft tissue nodularity. 5. Calculus within the most distal left ureterovesicular junction or just within the bladder lumen measuring 0.7 cm, unchanged. No associated left-sided hydronephrosis. 6. Soft tissue attenuation nodule of the body of  the right adrenal gland. Notably, this was also soft tissue attenuation on prior noncontrast examination (i.e. not definitively macroscopic fat containing) although unchanged compared to prior examinations dating back to 2018 and almost certainly a benign adenoma. Attention on follow-up. 7. No evidence of metastatic disease in the chest. 8. Mild diffuse bilateral bronchial wall thickening. Background of very fine centrilobular nodularity, most concentrated in the lung apices. Findings are most consistent with smoking-related respiratory bronchiolitis.     05/18/2022 Procedure   Ultrasound-guided core biopsy performed of a 10 mm soft tissue peritoneal mass in the right upper quadrant just superficial to the right lobe of the liver. The procedure was performed under general anesthesia immediately following Port-A-Cath placement.   05/18/2022 Procedure   Placement of single lumen port a cath via right internal jugular vein. The catheter tip lies at the cavo-atrial junction. A power injectable port a cath was placed and is ready for immediate use.     05/18/2022 Pathology Results   A. 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  The findings support the above interpretation.  This case was reviewed with Dr. Kenard Gower who agrees with the above diagnosis.  A p16 stain is pending and will be reported in an addendum.    05/23/2022 Cancer Staging   Staging form: Ovary, Fallopian Tube, and Primary Peritoneal Carcinoma, AJCC 8th Edition - Clinical stage from 05/23/2022: FIGO Stage IIIC (cT3c, cN0, cM0) - Signed by Artis Delay, MD on 05/23/2022 Stage prefix: Initial diagnosis   05/29/2022 Imaging   1. Complex bilateral adnexal masses, as described above, consistent with the patient's known malignancy. 2. Subsequent  marked severity mass effect on  the distal bilateral common iliac veins, increased in severity when compared to the prior exam. 3. Additional findings consistent with peritoneal metastasis within the pelvis. 4. Findings consistent with pelvic congestion syndrome. 5. Postoperative changes within the mid and lower lumbar spine. 6. Aortic atherosclerosis. 7. No evidence of venous thrombus within the abdomen, pelvis or proximal bilateral lower extremities.   Aortic Atherosclerosis (ICD10-I70.0).   06/02/2022 -  Chemotherapy   Patient is on Treatment Plan : OVARIAN Carboplatin (AUC 6) + Paclitaxel (175) q21d X 6 Cycles     06/26/2022 Tumor Marker   Patient's tumor was tested for the following markers: CA-125. Results of the tumor marker test revealed 134.   08/04/2022 Imaging   CT ABDOMEN PELVIS W CONTRAST  Result Date: 08/04/2022 CLINICAL DATA:  Ovarian cancer, chemotherapy in progress, for restaging EXAM: CT ABDOMEN AND PELVIS WITH CONTRAST TECHNIQUE: Multidetector CT imaging of the abdomen and pelvis was performed using the standard protocol following bolus administration of intravenous contrast. RADIATION DOSE REDUCTION: This exam was performed according to the departmental dose-optimization program which includes automated exposure control, adjustment of the mA and/or kV according to patient size and/or use of iterative reconstruction technique. CONTRAST:  OMNIPAQUE IOHEXOL 300 MG/ML  SOLN COMPARISON:  CT venogram abdomen/pelvis dated 05/29/2022. CT abdomen/pelvis dated 04/26/2022. FINDINGS: Lower chest: Lung bases are clear. Hepatobiliary: Liver is within normal limits. Gallbladder is unremarkable. No intrahepatic or extrahepatic ductal dilatation. Pancreas: Within normal limits. Spleen: Within normal limits. Adrenals/Urinary Tract: 2.4 cm right adrenal nodule (series 2/image 17), previously 2.0 cm in 2018, likely reflecting a benign adrenal adenoma. Left adrenal gland is within normal limits. Left kidney is within normal  limits. 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 Associated extrinsic compression at the level of the right adnexal mass (described below). Mildly thick-walled bladder, although underdistended. Stable 6 mm left UVJ calcification (series 2/image 68). Stomach/Bowel: Stomach is within normal limits. No evidence of bowel obstruction. Normal appendix (series 2/image 47). Left adnexal mass (described below) is favored to abut and directly invade the left lateral wall of the sigmoid colon (series 2/image 57). Vascular/Lymphatic: No evidence of abdominal aortic aneurysm. Atherosclerotic calcifications of the abdominal aorta and branch vessels. No suspicious abdominopelvic lymphadenopathy. Reproductive: Heterogeneous uterus with associated surface nodularity (series 2/image 65), likely reflecting peritoneal disease. This appearance is improved from the prior. 3.9 x 3.3 cm left adnexal mass (series 2/image 60), previously 5.3 x 3.9 cm, with extension along the sigmoid mesocolon (series 2/image 56) and suspected involvement of the sigmoid colon (series 2/image 57). 5.6 x 2.7 cm mixed cystic/solid right adnexal mass (series 2/image 89), previously 6.9 x 4.3 cm when measured in a similar fashion. Other: No abdominopelvic ascites. Scattered minimal peritoneal nodularity beneath the anterior abdominal wall, including a 5 mm nodule beneath the left anterior abdominal wall (series 2/image 27) which previously measured 12 mm. Additional mild nodularity on series 2/images 32, 33, and 50. Musculoskeletal: Status post PLIF at L4-S1. Status post lateral fixation at L4-5. Status post anterior fixation at L5-S1. Mild degenerative changes of the lower thoracic spine. IMPRESSION: Improving bilateral adnexa masses in this patient with known ovarian cancer, as above. Suspected involvement of the sigmoid colon. No evidence of bowel obstruction.  Improving peritoneal nodularity, as above. Additional ancillary findings as above. Electronically Signed   By: Charline Bills M.D.   On: 08/04/2022 01:27      08/16/2022 Pathology  Results   CASE: [id number] PATIENT: [name] Surgical Pathology Report  Clinical History: Advanced gyn malignancy (crm)  FINAL MICROSCOPIC DIAGNOSIS:  A. OMENTUM, RESECTION: - Positive for carcinoma  B. LIVER ADHESION, EXCISION: - Benign fibroadipose tissue - Negative for carcinoma  C. SMALL BOWEL MESENTERIC IMPLANT, EXCISION: - Positive for carcinoma with extensive necrosis  D. UTERUS, CERVIX, BILATERAL FALLOPIAN TUBES AND OVARIES, AND APPENDIX, RESECTION: - Uterine serosa: Tumor deposits present - Cervix: Benign, nabothian cyst - Endometrium: Benign inactive endometrium, benign endometrial polyp - Myometrium: Leiomyoma - Right fallopian tube: Metastatic tumor deposits present - Right ovary: High-grade serous carcinoma - Left ovary: High-grade serous carcinoma - Benign appendix - See oncology table  E. COLON, RECTOSIGMOID, RESECTION: - Benign colonic mucosa - Margins free of carcinoma - Adherent fallopian tube with metastatic tumor deposits - 2 benign lymph nodes  F. RECTAL TUMOR IMPLANT, EXCISION: - Metastatic high-grade serous carcinoma   G. COLON, RECTOSIGMOID ANASTOMOTIC RINGS: - Benign anastomotic donuts  ONCOLOGY TABLE:   OVARY or FALLOPIAN TUBE or PRIMARY PERITONEUM: Resection  Procedure: Total abdominal hysterectomy, bilateral salpingo-oophorectomy, appendectomy, rectosigmoid resection, total omentectomy, small bowel nodule resection Specimen Integrity: Intact Tumor Site: Bilateral Tumor Size: Right ovary: 3.9 cm, left ovary: Approximately 1.2 cm Histologic Type: High-grade serous carcinoma Histologic Grade: High-grade Ovarian Surface Involvement: Present Fallopian Tube Surface Involvement: Present Implants: Present, small bowel mesenteric and rectovaginal  septum Lymphatic and/or Vascular Invasion: Not identified Other Tissue/ Organ Involvement: Fallopian tube, bilateral Largest Extrapelvic Peritoneal Focus: 1.4 cm, small bowel mesentery Peritoneal/Ascitic Fluid Involvement: Not applicable Chemotherapy Response Score (CRS): Not applicable, no known presurgical therapy Regional Lymph Nodes: Not applicable (no lymph nodes submitted or found) Distant Metastasis:      Distant Site(s) Involved: Not applicable Pathologic Stage Classification (pTNM, AJCC 8th Edition): pT3b, pN[not assigned] Ancillary Studies: Can be performed upon request Representative Tumor Block: D8 Comment(s): Tumor is staged as pT3 given the presence of extrapelvic peritoneal deposits on small bowel mesentery less than 2cm (1.4 cm) (v1.3.0.1)      Interval History: Doing well.  Denies any significant abdominal pain.  Has some intermittent burning sensation on the right on her skin since surgery.  She occasionally notes that her urine is slow at times, unchanged since prior to surgery.  Denies any other urinary symptoms.  Denies any vaginal bleeding or discharge.  Reports normal bowel function, has continued to take Metamucil regularly.  Notes her appetite is improving although still decreased.  Bloating is overall better, some cramping today.   She just recently moved.   Past Medical/Surgical History: Past Medical History:  Diagnosis Date   Anxiety    Asthma    Back problem    disc disease, chronic low back pain, right leg pain, s/p discectomy 99   Cancer (HCC)    ovarian cancer   Depression    Diastolic dysfunction    with elevated LVEDP at cath   Endometriosis    Fibroids    hx of    GERD (gastroesophageal reflux disease)    Heart murmur    Hepatitis C    treated 8-9 years ago   Hidradenitis suppurativa    History of kidney stones    Hypertension    MS (multiple sclerosis) (HCC)    Neuromuscular disorder (HCC)    Mulitple sclerosis   Osteoarthritis     Pre-diabetes    Prediabetes    RA (rheumatoid arthritis) (HCC)    Rotator cuff tear    right shoulder  Syncope     Past Surgical History:  Procedure Laterality Date   ANGIOPLASTY     BACK SURGERY     lumbar surgery x 2 done in [place] and [place]   COLON RESECTION SIGMOID  08/16/2022   Procedure: COLON RESECTION SIGMOID;  Surgeon: Carver Fila, MD;  Location: [facility];  Service: Gynecology;;   COLONOSCOPY  07/2010   tubular adenoma   COLONOSCOPY  08/2015   CYSTOSCOPY N/A 08/16/2022   Procedure: CYSTOSCOPY;  Surgeon: Carver Fila, MD;  Location: [facility];  Service: Gynecology;  Laterality: N/A;   DISKECTOMY     HYSTERECTOMY ABDOMINAL WITH SALPINGO-OOPHORECTOMY  08/16/2022   Procedure: HYSTERECTOMY ABDOMINAL WITH SALPINGO-OOPHORECTOMY;  Surgeon: Carver Fila, MD;  Location: [facility];  Service: Gynecology;;   IR IMAGING GUIDED PORT INSERTION  05/18/2022   IR US GUIDE BX ASP/DRAIN  05/18/2022   LAPAROSCOPY N/A 08/16/2022   Procedure: LAPAROSCOPY DIAGNOSTIC;  Surgeon: Carver Fila, MD;  Location: [facility];  Service: Gynecology;  Laterality: N/A;   laparoscopy for endometriosis     left shoulder scope     RADIOLOGY WITH ANESTHESIA N/A 05/18/2022   Procedure: IR WITH ANESTHESIA PORT AND BIOPSY;  Surgeon: Irish Lack, MD;  Location: [facility];  Service: Radiology;  Laterality: N/A;   ROTATOR CUFF REPAIR Left    Spinal Fusion  12/2016   L4-S1    Family History  Problem Relation Age of Onset   Heart disease Mother        Died with MI 3, chest pain 30s   Hypertension Mother    Pancreatitis Father    Atrial fibrillation Sister    Breast cancer Maternal Aunt    Multiple sclerosis Cousin     Social History   Socioeconomic History   Marital status: Single    Spouse name: Not on file   Number of children: 1   Years of education: Not on file   Highest education level: Not on file  Occupational History   Not on file  Tobacco Use   Smoking status: Some  Days    Packs/day: 0.10    Years: 27.00    Additional pack years: 0.00    Total pack years: 2.70    Types: Cigarettes    Last attempt to quit: 2023    Years since quitting: 1.3   Smokeless tobacco: Never  Vaping Use   Vaping Use: Never used  Substance and Sexual Activity   Alcohol use: Yes    Comment: Occasional    Drug use: Yes    Types: Marijuana   Sexual activity: Yes  Other Topics Concern   Not on file  Social History Narrative   Lives alone   Right handed   Caffeine: none    Social Determinants of Health   Financial Resource Strain: Not on file  Food Insecurity: No Food Insecurity (08/17/2022)   Hunger Vital Sign    Worried About Running Out of Food in the Last Year: Never true    Ran Out of Food in the Last Year: Never true  Transportation Needs: No Transportation Needs (08/17/2022)   PRAPARE - Administrator, Civil Service (Medical): No    Lack of Transportation (Non-Medical): No  Physical Activity: Not on file  Stress: Not on file  Social Connections: Not on file    Current Medications:  Current Outpatient Medications:    albuterol (VENTOLIN HFA) 108 (90 Base) MCG/ACT inhaler, Inhale 2 puffs into the lungs  every 6 (six) hours as needed for shortness of breath., Disp: , Rfl:    amphetamine-dextroamphetamine (ADDERALL) 20 MG tablet, Take 10-20 mg by mouth See admin instructions. Take 20 mg by mouth in the morning and 10 mg between 1-2 PM daily, Disp: , Rfl:    apixaban (ELIQUIS) 2.5 MG TABS tablet, Take 1 tablet (2.5 mg total) by mouth 2 (two) times daily., Disp: 60 tablet, Rfl: 11   Ascorbic Acid (VITAMIN C) 1000 MG tablet, Take 1,000 mg by mouth daily., Disp: , Rfl:    ASHWAGANDHA PO, Take 1 capsule by mouth daily., Disp: , Rfl:    BLACK CURRANT SEED OIL PO, Take 1 capsule by mouth daily., Disp: , Rfl:    dexamethasone (DECADRON) 4 MG tablet, Take 2 tabs at the night before and 2 tab the morning of chemotherapy, every 3 weeks, by mouth x 6 cycles,  Disp: 24 tablet, Rfl: 6   diclofenac Sodium (VOLTAREN) 1 % GEL, Apply 2 g topically 2 (two) times daily., Disp: , Rfl:    docusate sodium (COLACE) 100 MG capsule, Take 1 capsule (100 mg total) by mouth 2 (two) times daily. Do not take if having diarrhea, Disp: 30 capsule, Rfl: 0   lidocaine-prilocaine (EMLA) cream, Apply to affected area once (Patient taking differently: Apply 1 Application topically daily as needed (port access). Apply to affected area once), Disp: 30 g, Rfl: 3   LORazepam (ATIVAN) 0.5 MG tablet, Take 0.5 mg by mouth 3 (three) times daily as needed for anxiety., Disp: , Rfl:    magic mouthwash w/lidocaine SOLN, Take 5 mLs by mouth 4 (four) times daily. Take 5 mls 4 x day for 7 days, swish and swallow., Disp: 160 mL, Rfl: 0   meclizine (ANTIVERT) 12.5 MG tablet, Take 12.5 mg by mouth 3 (three) times daily as needed for dizziness., Disp: , Rfl:    Melatonin 10 MG TABS, Take 10 mg by mouth at bedtime as needed (sleep)., Disp: , Rfl:    Multiple Vitamin (MULTIVITAMIN WITH MINERALS) TABS tablet, Take 1 tablet by mouth daily., Disp: , Rfl:    mupirocin ointment (BACTROBAN) 2 %, Apply 1 Application topically 3 (three) times daily as needed (HS flares)., Disp: , Rfl:    naloxone (NARCAN) nasal spray 4 mg/0.1 mL, Place 1 spray into the nose as needed (opioid overdose)., Disp: , Rfl:    ondansetron (ZOFRAN) 8 MG tablet, Take 8 mg by mouth every 8 (eight) hours as needed for nausea or vomiting., Disp: , Rfl:    OVER THE COUNTER MEDICATION, Take 2 capsules by mouth daily. [place] advanced supplement, Disp: , Rfl:    oxyCODONE ER (XTAMPZA ER) 13.5 MG C12A, Take 13.5 mg by mouth in the morning, at noon, and at bedtime., Disp: , Rfl:    Oxycodone HCl 20 MG TABS, Take 20 mg by mouth 5 (five) times daily as needed (pain)., Disp: , Rfl:    polyethylene glycol (MIRALAX / GLYCOLAX) 17 g packet, Take 17 g by mouth daily., Disp: , Rfl:    prochlorperazine (COMPAZINE) 10 MG tablet, Take 10 mg by mouth  every 6 (six) hours as needed for nausea or vomiting., Disp: , Rfl:    Vitamin D, Ergocalciferol, (DRISDOL) 1.25 MG (50000 UNIT) CAPS capsule, Take 50,000 Units by mouth every Sunday., Disp: , Rfl:    WIXELA INHUB 250-50 MCG/ACT AEPB, Inhale 1 puff into the lungs daily., Disp: , Rfl:    Vitamin A 2400 MCG (8000 UT) CAPS, Take 2,400  mcg by mouth daily. (Patient not taking: Reported on 09/12/2022), Disp: , Rfl:    zinc gluconate 50 MG tablet, Take 50 mg by mouth daily. (Patient not taking: Reported on 09/12/2022), Disp: , Rfl:   Review of Systems: + Decreased appetite, fatigue, bloating, burning sensation on abdomen, joint pain, numbness, anxiety. Denies fevers, chills, unexplained weight changes. Denies hearing loss, neck lumps or masses, mouth sores, ringing in ears or voice changes. Denies cough or wheezing.  Denies shortness of breath. Denies chest pain or palpitations. Denies leg swelling. Denies blood in stools, constipation, diarrhea, nausea, vomiting, or early satiety. Denies pain with intercourse, dysuria, frequency, hematuria or incontinence. Denies hot flashes, pelvic pain, vaginal bleeding or vaginal discharge.   Denies back pain or muscle pain/cramps. Denies itching, rash, or wounds. Denies dizziness, headaches or seizures. Denies swollen lymph nodes or glands, denies easy bruising or bleeding. Denies depression, confusion, or decreased concentration.  Physical Exam: BP (!) 135/93 (BP Location: Left Arm, Patient Position: Sitting)   Pulse 92   Temp 98.8 F (37.1 C) (Oral)   Ht 5' 8.9" (1.75 m)   Wt 169 lb 6.4 oz (76.8 kg)   SpO2 99%   BMI 25.09 kg/m  General: Alert, oriented, no acute distress. HEENT: Normocephalic, atraumatic, sclera anicteric. Chest: Unlabored breathing on room air. Abdomen: soft, nontender.  Normoactive bowel sounds.  No masses or hepatosplenomegaly appreciated.  Well-healed incision. Extremities: Grossly normal range of motion.  Warm, well perfused.  No  edema bilaterally. GU: Normal appearing external genitalia without erythema, excoriation, or lesions.  Speculum exam reveals cuff intact, suture visible.  Bimanual exam reveals cuff intact, no fluctuance or tenderness with palpation.   Laboratory & Radiologic Studies: A. OMENTUM, RESECTION: - Positive for carcinoma  B. LIVER ADHESION, EXCISION: - Benign fibroadipose tissue - Negative for carcinoma  C. SMALL BOWEL MESENTERIC IMPLANT, EXCISION: - Positive for carcinoma with extensive necrosis  D. UTERUS, CERVIX, BILATERAL FALLOPIAN TUBES AND OVARIES, AND APPENDIX, RESECTION: - Uterine serosa: Tumor deposits present - Cervix: Benign, nabothian cyst - Endometrium: Benign inactive endometrium, benign endometrial polyp - Myometrium: Leiomyoma - Right fallopian tube: Metastatic tumor deposits present - Right ovary: High-grade serous carcinoma - Left ovary: High-grade serous carcinoma - Benign appendix - See oncology table  E. COLON, RECTOSIGMOID, RESECTION: - Benign colonic mucosa - Margins free of carcinoma - Adherent fallopian tube with metastatic tumor deposits - 2 benign lymph nodes  F. RECTAL TUMOR IMPLANT, EXCISION: - Metastatic high-grade serous carcinoma   G. COLON, RECTOSIGMOID ANASTOMOTIC RINGS: - Benign anastomotic donuts  ONCOLOGY TABLE:   OVARY or FALLOPIAN TUBE or PRIMARY PERITONEUM: Resection  Procedure: Total abdominal hysterectomy, bilateral salpingo-oophorectomy, appendectomy, rectosigmoid resection, total omentectomy, small bowel nodule resection Specimen Integrity: Intact Tumor Site: Bilateral Tumor Size: Right ovary: 3.9 cm, left ovary: Approximately 1.2 cm Histologic Type: High-grade serous carcinoma Histologic Grade: High-grade Ovarian Surface Involvement: Present Fallopian Tube Surface Involvement: Present Implants: Present, small bowel mesenteric and rectovaginal septum Lymphatic and/or Vascular Invasion: Not identified Other Tissue/ Organ  Involvement: Fallopian tube, bilateral Largest Extrapelvic Peritoneal Focus: 1.4 cm, small bowel mesentery Peritoneal/Ascitic Fluid Involvement: Not applicable Chemotherapy Response Score (CRS): Not applicable, no known presurgical therapy Regional Lymph Nodes: Not applicable (no lymph nodes submitted or found) Distant Metastasis:      Distant Site(s) Involved: Not applicable Pathologic Stage Classification (pTNM, AJCC 8th Edition): pT3b, pN[not assigned] Ancillary Studies: Can be performed upon request Representative Tumor Block: D8 Comment(s): Tumor is staged as pT3 given the presence of extrapelvic  peritoneal deposits on small bowel mesentery less than 2cm (1.4 cm) (v1.3.0.1)    ADDENDUM:  - Upon additional review, a microscopic focus of serous tubal  intraepithelial carcinoma is identified in the fallopian tube.  This  finding along with the presence of residual normal ovarian parenchyma  fevers or tubal origin of the high-grade serous carcinoma.  The  diagnosis and staging is unchanged.  This case was reviewed with Dr.  Luisa Hart and Dr. Reynolds Bowl who agree with the above interpretation.   Assessment & Plan: [name] is a 67 y.o. 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   The patient is overall doing very well.  Reviewed continued expectations and restrictions.  Given some cramping and fiber intake, discussed stopping Metamucil and transitioning to stool softener or MiraLAX if needed for bowel function.  Discussed that burning sensation is likely related to nerve damage from surgery and will hopefully get better over the coming weeks.   We discussed in detail her pathology report.  The patient was given a copy of the report again today.  Based on pathology findings, it appears that this was a cancer that arose in her fallopian tube.  Discussed need for germline  and genomic testing.  Referral was placed for genetics.  I spoke with Clydie Braun, our nurse navigator.  If genetic testing comes back negative for mutation such as BRCA, we will plan for genomic testing.  The patient sees Dr. Bertis Ruddy tomorrow and will restart chemotherapy.  28 minutes of total time was spent for this patient encounter, including preparation, face-to-face counseling with the patient and coordination of care, and documentation of the encounter.  Eugene Garnet, MD  Division of Gynecologic Oncology  Department of Obstetrics and Gynecology  [facility]

## 2022-09-15 ENCOUNTER — Other Ambulatory Visit: Payer: Self-pay

## 2022-09-15 ENCOUNTER — Encounter: Payer: Self-pay | Admitting: Hematology and Oncology

## 2022-09-15 ENCOUNTER — Inpatient Hospital Stay: Payer: 59

## 2022-09-15 ENCOUNTER — Inpatient Hospital Stay (HOSPITAL_BASED_OUTPATIENT_CLINIC_OR_DEPARTMENT_OTHER): Payer: 59 | Admitting: Hematology and Oncology

## 2022-09-15 VITALS — BP 153/82 | HR 84 | Temp 97.6°F | Resp 18 | Wt 176.0 lb

## 2022-09-15 VITALS — BP 163/95 | HR 86 | Temp 98.4°F | Resp 16

## 2022-09-15 DIAGNOSIS — C5700 Malignant neoplasm of unspecified fallopian tube: Secondary | ICD-10-CM

## 2022-09-15 DIAGNOSIS — C579 Malignant neoplasm of female genital organ, unspecified: Secondary | ICD-10-CM

## 2022-09-15 DIAGNOSIS — D701 Agranulocytosis secondary to cancer chemotherapy: Secondary | ICD-10-CM

## 2022-09-15 DIAGNOSIS — C786 Secondary malignant neoplasm of retroperitoneum and peritoneum: Secondary | ICD-10-CM | POA: Diagnosis not present

## 2022-09-15 DIAGNOSIS — G620 Drug-induced polyneuropathy: Secondary | ICD-10-CM

## 2022-09-15 DIAGNOSIS — C563 Malignant neoplasm of bilateral ovaries: Secondary | ICD-10-CM

## 2022-09-15 DIAGNOSIS — T451X5A Adverse effect of antineoplastic and immunosuppressive drugs, initial encounter: Secondary | ICD-10-CM | POA: Diagnosis not present

## 2022-09-15 DIAGNOSIS — Z5111 Encounter for antineoplastic chemotherapy: Secondary | ICD-10-CM | POA: Diagnosis not present

## 2022-09-15 DIAGNOSIS — Z95828 Presence of other vascular implants and grafts: Secondary | ICD-10-CM

## 2022-09-15 LAB — CBC WITH DIFFERENTIAL (CANCER CENTER ONLY)
Abs Immature Granulocytes: 0.01 10*3/uL (ref 0.00–0.07)
Basophils Absolute: 0 10*3/uL (ref 0.0–0.1)
Basophils Relative: 0 %
Eosinophils Absolute: 0 10*3/uL (ref 0.0–0.5)
Eosinophils Relative: 0 %
HCT: 35.2 % — ABNORMAL LOW (ref 36.0–46.0)
Hemoglobin: 12.3 g/dL (ref 12.0–15.0)
Immature Granulocytes: 0 %
Lymphocytes Relative: 20 %
Lymphs Abs: 0.6 10*3/uL — ABNORMAL LOW (ref 0.7–4.0)
MCH: 34.2 pg — ABNORMAL HIGH (ref 26.0–34.0)
MCHC: 34.9 g/dL (ref 30.0–36.0)
MCV: 97.8 fL (ref 80.0–100.0)
Monocytes Absolute: 0.2 10*3/uL (ref 0.1–1.0)
Monocytes Relative: 6 %
Neutro Abs: 2.3 10*3/uL (ref 1.7–7.7)
Neutrophils Relative %: 74 %
Platelet Count: 275 10*3/uL (ref 150–400)
RBC: 3.6 MIL/uL — ABNORMAL LOW (ref 3.87–5.11)
RDW: 13.2 % (ref 11.5–15.5)
WBC Count: 3.1 10*3/uL — ABNORMAL LOW (ref 4.0–10.5)
nRBC: 0 % (ref 0.0–0.2)

## 2022-09-15 LAB — CMP (CANCER CENTER ONLY)
ALT: 21 U/L (ref 0–44)
AST: 17 U/L (ref 15–41)
Albumin: 4.4 g/dL (ref 3.5–5.0)
Alkaline Phosphatase: 80 U/L (ref 38–126)
Anion gap: 7 (ref 5–15)
BUN: 19 mg/dL (ref 8–23)
CO2: 26 mmol/L (ref 22–32)
Calcium: 9.3 mg/dL (ref 8.9–10.3)
Chloride: 103 mmol/L (ref 98–111)
Creatinine: 0.78 mg/dL (ref 0.44–1.00)
GFR, Estimated: >60 mL/min (ref >60)
Glucose, Bld: 139 mg/dL — ABNORMAL HIGH (ref 70–99)
Potassium: 3.8 mmol/L (ref 3.5–5.1)
Sodium: 136 mmol/L (ref 135–145)
Total Bilirubin: 0.4 mg/dL (ref 0.3–1.2)
Total Protein: 7.4 g/dL (ref 6.5–8.1)

## 2022-09-15 MED ORDER — HEPARIN SOD (PORK) LOCK FLUSH 100 UNIT/ML IV SOLN
500.0000 [IU] | Freq: Once | INTRAVENOUS | Status: AC | PRN
  Administered 2022-09-15: 500 [IU]

## 2022-09-15 MED ORDER — SODIUM CHLORIDE 0.9 % IV SOLN: Freq: Once | INTRAVENOUS | Status: AC

## 2022-09-15 MED ORDER — FAMOTIDINE 20 MG IN NS 100 ML IVPB
20.0000 mg | Freq: Once | INTRAVENOUS | Status: AC
  Administered 2022-09-15: 20 mg via INTRAVENOUS
  Filled 2022-09-15: qty 100

## 2022-09-15 MED ORDER — SODIUM CHLORIDE 0.9 % IV SOLN
150.0000 mg | Freq: Once | INTRAVENOUS | Status: AC
  Administered 2022-09-15: 150 mg via INTRAVENOUS
  Filled 2022-09-15 (×2): qty 5
  Filled 2022-09-15: qty 150

## 2022-09-15 MED ORDER — SODIUM CHLORIDE 0.9% FLUSH
10.0000 mL | Freq: Once | INTRAVENOUS | Status: AC
  Administered 2022-09-15: 10 mL

## 2022-09-15 MED ORDER — PALONOSETRON HCL INJECTION 0.25 MG/5ML
0.2500 mg | Freq: Once | INTRAVENOUS | Status: AC
  Administered 2022-09-15: 0.25 mg via INTRAVENOUS
  Filled 2022-09-15: qty 5

## 2022-09-15 MED ORDER — SODIUM CHLORIDE 0.9 % IV SOLN
131.2500 mg/m2 | Freq: Once | INTRAVENOUS | Status: AC
  Administered 2022-09-15: 258 mg via INTRAVENOUS
  Filled 2022-09-15: qty 43

## 2022-09-15 MED ORDER — SODIUM CHLORIDE 0.9 % IV SOLN
10.0000 mg | Freq: Once | INTRAVENOUS | Status: AC
  Administered 2022-09-15: 10 mg via INTRAVENOUS
  Filled 2022-09-15: qty 10
  Filled 2022-09-15: qty 1

## 2022-09-15 MED ORDER — SODIUM CHLORIDE 0.9% FLUSH
10.0000 mL | INTRAVENOUS | Status: DC | PRN
  Administered 2022-09-15: 10 mL

## 2022-09-15 MED ORDER — CETIRIZINE HCL 10 MG/ML IV SOLN
10.0000 mg | Freq: Once | INTRAVENOUS | Status: AC
  Administered 2022-09-15: 10 mg via INTRAVENOUS
  Filled 2022-09-15: qty 1

## 2022-09-15 MED ORDER — SODIUM CHLORIDE 0.9 % IV SOLN
681.0000 mg | Freq: Once | INTRAVENOUS | Status: AC
  Administered 2022-09-15: 680 mg via INTRAVENOUS
  Filled 2022-09-15: qty 68

## 2022-09-15 NOTE — Assessment & Plan Note (Signed)
She is recovering well from surgery We will resume chemotherapy for 3 more cycle She is scheduled for genetic counseling and genetic testing and we discussed implication of genetic testing

## 2022-09-15 NOTE — Assessment & Plan Note (Signed)
This is stable ?Observe closely ?

## 2022-09-15 NOTE — Assessment & Plan Note (Signed)
This is likely due to recent treatment. The patient denies recent history of fevers, cough, chills, diarrhea or dysuria. She is asymptomatic from the leukopenia. I will observe for now.    

## 2022-09-15 NOTE — Progress Notes (Signed)
Received staff message from patient regarding financial assistance. RN provided my card to patient and advised her to contact me on Mon 5/13 per my request.

## 2022-09-15 NOTE — Progress Notes (Signed)
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 The patient denies recent history of fevers, cough, chills, diarrhea or dysuria. She is asymptomatic from the leukopenia. I will observe for now.    No orders of the defined types were placed in this encounter.   All questions were answered. The patient knows to call the clinic with any problems, questions or concerns. The total time spent in the appointment was 20 minutes encounter with patients including review of chart and various tests results, discussions about plan of care and coordination of care plan   Artis Delay, MD 09/15/2022 9:29 AM  INTERVAL HISTORY: Please see below for problem oriented charting. she returns for treatment follow-up to resume treatment after recent surgery She is doing well and healing well She has no worsening neuropathy We discussed future follow-up  REVIEW OF SYSTEMS:   Constitutional: Denies fevers, chills or abnormal weight loss Eyes: Denies blurriness of vision Ears, nose, mouth, throat, and face: Denies mucositis or sore throat Respiratory: Denies cough, dyspnea or wheezes Cardiovascular: Denies palpitation, chest discomfort or lower extremity swelling Gastrointestinal:  Denies nausea, heartburn or change in bowel habits Skin: Denies abnormal skin rashes Lymphatics: Denies new lymphadenopathy or easy bruising Behavioral/Psych: Mood is stable, no new  changes  All other systems were reviewed with the patient and are negative.  I have reviewed the past medical history, past surgical history, social history and family history with the patient and they are unchanged from previous note.  ALLERGIES:  is allergic to cymbalta [duloxetine hcl], cyclobenzaprine hcl, celexa  [citalopram hydrobromide], fluticasone furoate-vilanterol, gabapentin, opana  [oxymorphone hcl], and tramadol.  MEDICATIONS:  Current Outpatient Medications  Medication Sig Dispense Refill   albuterol (VENTOLIN HFA) 108 (90 Base) MCG/ACT inhaler Inhale 2 puffs into the lungs every 6 (six) hours as needed for shortness of breath.     amphetamine-dextroamphetamine (ADDERALL) 20 MG tablet Take 10-20 mg by mouth See admin instructions. Take 20 mg by mouth in the morning and 10 mg between 1-2 PM daily     apixaban (ELIQUIS) 2.5 MG TABS tablet Take 1 tablet (2.5 mg total) by mouth 2 (two) times daily. 60 tablet 11   Ascorbic Acid (VITAMIN C) 1000 MG tablet Take 1,000 mg by mouth daily.     ASHWAGANDHA PO Take 1 capsule by mouth daily.     BLACK CURRANT SEED OIL PO Take 1 capsule by mouth daily.     dexamethasone (DECADRON) 4 MG tablet Take 2 tabs at the night before and 2 tab the morning of chemotherapy, every 3 weeks, by mouth x 6 cycles 24 tablet 6   diclofenac Sodium (VOLTAREN) 1 % GEL Apply 2 g topically 2 (two) times daily.     docusate sodium (COLACE) 100 MG capsule Take 1 capsule (100 mg total) by mouth 2 (two) times daily. Do not take if having diarrhea 30 capsule 0   lidocaine-prilocaine (EMLA) cream Apply to affected area once (Patient taking differently: Apply 1 Application topically daily as needed (port  access). Apply to affected area once) 30 g 3   LORazepam (ATIVAN) 0.5 MG tablet Take 0.5 mg by mouth 3 (three) times daily as needed for anxiety.     magic mouthwash w/lidocaine SOLN Take 5 mLs by mouth 4 (four) times daily. Take 5 mls 4 x day for 7 days, swish and swallow.  160 mL 0   meclizine (ANTIVERT) 12.5 MG tablet Take 12.5 mg by mouth 3 (three) times daily as needed for dizziness.     Melatonin 10 MG TABS Take 10 mg by mouth at bedtime as needed (sleep).     Multiple Vitamin (MULTIVITAMIN WITH MINERALS) TABS tablet Take 1 tablet by mouth daily.     mupirocin ointment (BACTROBAN) 2 % Apply 1 Application topically 3 (three) times daily as needed (HS flares).     naloxone (NARCAN) nasal spray 4 mg/0.1 mL Place 1 spray into the nose as needed (opioid overdose).     ondansetron (ZOFRAN) 8 MG tablet Take 8 mg by mouth every 8 (eight) hours as needed for nausea or vomiting.     OVER THE COUNTER MEDICATION Take 2 capsules by mouth daily. [place] advanced supplement     oxyCODONE ER (XTAMPZA ER) 13.5 MG C12A Take 13.5 mg by mouth in the morning, at noon, and at bedtime.     Oxycodone HCl 20 MG TABS Take 20 mg by mouth 5 (five) times daily as needed (pain).     polyethylene glycol (MIRALAX / GLYCOLAX) 17 g packet Take 17 g by mouth daily.     prochlorperazine (COMPAZINE) 10 MG tablet Take 10 mg by mouth every 6 (six) hours as needed for nausea or vomiting.     Vitamin A 2400 MCG (8000 UT) CAPS Take 2,400 mcg by mouth daily. (Patient not taking: Reported on 09/12/2022)     Vitamin D, Ergocalciferol, (DRISDOL) 1.25 MG (50000 UNIT) CAPS capsule Take 50,000 Units by mouth every Sunday.     WIXELA INHUB 250-50 MCG/ACT AEPB Inhale 1 puff into the lungs daily.     zinc gluconate 50 MG tablet Take 50 mg by mouth daily. (Patient not taking: Reported on 09/12/2022)     No current facility-administered medications for this visit.    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  Margins of this mass are irregular.  There is small fluid in the cul-de-sac as well as fullness of  soft tissue about the right adnexa.  Discrete uterine structure is not identified.  Left upper quadrant nodules beneath the left hemidiaphragm (1 anterior to the spleen and the other to the stomach).  Omental/peritoneal nodularity and multiple perihepatic nodules are noted    04/18/2022 Tumor Marker   Patient's tumor was tested for the following markers: CA-125. Results of the tumor marker test revealed 123.   04/19/2022 Initial Diagnosis   Carcinomatosis (HCC)   04/28/2022 Imaging   1. Bulky bilateral ovarian masses and nodules, left-greater-than-right, which are essentially confluent with adjacent pelvic soft tissue nodularity and not significantly changed compared to prior examination dated 04/11/2022. 2. Extensive pelvic peritoneal thickening and nodularity. Multiple small peritoneal nodules throughout the abdomen and pelvis. Findings are consistent with peritoneal metastatic disease and likewise not significantly changed. 3. Small volume of loculated appearing fluid in the low pelvis. 4. 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 The mid to distal right ureter is obstructed by right ovarian mass and or soft tissue nodularity. 5. Calculus within the most distal left ureterovesicular junction or just within the bladder lumen measuring 0.7 cm, unchanged. No associated left-sided hydronephrosis. 6. Soft tissue attenuation nodule of the body of the right adrenal gland. Notably, this was also soft tissue attenuation on prior noncontrast examination (i.e. not definitively macroscopic fat containing) although unchanged compared to prior examinations dating back to 2018 and almost certainly a benign adenoma. Attention on follow-up. 7. No evidence of metastatic disease in the chest. 8. Mild diffuse bilateral bronchial wall thickening. Background of very fine centrilobular nodularity, most concentrated in  the lung apices. Findings are most consistent with smoking-related respiratory bronchiolitis.     05/18/2022 Procedure   Ultrasound-guided core biopsy performed of a 10 mm soft tissue peritoneal mass in the right upper quadrant just superficial to the right lobe of the liver. The procedure was performed under general anesthesia immediately following Port-A-Cath placement.   05/18/2022 Procedure   Placement of single lumen port a cath via right internal jugular vein. The catheter tip lies at the cavo-atrial junction. A power injectable port a cath was placed and is ready for immediate use.     05/18/2022 Pathology Results   A. 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  The findings support the above interpretation.  This case was reviewed with Dr. Kenard Gower who agrees with the above diagnosis.  A p16 stain is pending and will be reported in an addendum.    05/23/2022 Cancer Staging   Staging form: Ovary, Fallopian Tube, and Primary Peritoneal Carcinoma, AJCC 8th Edition - Clinical stage from 05/23/2022: FIGO Stage IIIC (cT3c, cN0, cM0) - Signed by Artis Delay, MD on 05/23/2022 Stage prefix: Initial diagnosis   05/29/2022 Imaging   1. Complex bilateral adnexal masses, as described above, consistent with the patient's known malignancy. 2. Subsequent marked severity mass effect on the distal bilateral common iliac veins, increased in severity when compared to the prior exam. 3. Additional findings consistent with peritoneal metastasis within the pelvis. 4. Findings consistent with pelvic congestion syndrome. 5. Postoperative changes within the mid and lower lumbar spine. 6. Aortic atherosclerosis. 7. No evidence of venous thrombus within the abdomen, pelvis or proximal bilateral lower extremities.   Aortic Atherosclerosis  (ICD10-I70.0).   06/02/2022 -  Chemotherapy   Patient is on Treatment Plan : OVARIAN Carboplatin (AUC 6) + Paclitaxel (175) q21d X 6 Cycles     06/26/2022 Tumor Marker   Patient's tumor was tested for the following markers: CA-125. Results of the tumor marker test revealed 134.   08/04/2022 Imaging   CT ABDOMEN PELVIS W CONTRAST  Result Date: 08/04/2022 CLINICAL DATA:  Ovarian cancer, chemotherapy in progress, for restaging EXAM: CT ABDOMEN AND PELVIS WITH CONTRAST TECHNIQUE: Multidetector CT imaging of the abdomen and pelvis was performed using the standard protocol following bolus administration of intravenous contrast. RADIATION DOSE REDUCTION: This exam was performed according to the departmental dose-optimization program which includes automated exposure control, adjustment of the mA and/or kV according to patient size and/or use of iterative reconstruction technique. CONTRAST:  OMNIPAQUE IOHEXOL 300 MG/ML  SOLN COMPARISON:  CT venogram abdomen/pelvis dated 05/29/2022. CT abdomen/pelvis dated 04/26/2022. FINDINGS: Lower  chest: Lung bases are clear. Hepatobiliary: Liver is within normal limits. Gallbladder is unremarkable. No intrahepatic or extrahepatic ductal dilatation. Pancreas: Within normal limits. Spleen: Within normal limits. Adrenals/Urinary Tract: 2.4 cm right adrenal nodule (series 2/image 17), previously 2.0 cm in 2018, likely reflecting a benign adrenal adenoma. Left adrenal gland is within normal limits. Left kidney is within normal limits. 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 Associated extrinsic compression at the level of the right adnexal mass (described below). Mildly thick-walled bladder, although underdistended. Stable 6 mm left UVJ calcification (series 2/image 68). Stomach/Bowel: Stomach is within normal limits. No evidence of bowel obstruction. Normal appendix (series 2/image  47). Left adnexal mass (described below) is favored to abut and directly invade the left lateral wall of the sigmoid colon (series 2/image 57). Vascular/Lymphatic: No evidence of abdominal aortic aneurysm. Atherosclerotic calcifications of the abdominal aorta and branch vessels. No suspicious abdominopelvic lymphadenopathy. Reproductive: Heterogeneous uterus with associated surface nodularity (series 2/image 65), likely reflecting peritoneal disease. This appearance is improved from the prior. 3.9 x 3.3 cm left adnexal mass (series 2/image 60), previously 5.3 x 3.9 cm, with extension along the sigmoid mesocolon (series 2/image 56) and suspected involvement of the sigmoid colon (series 2/image 57). 5.6 x 2.7 cm mixed cystic/solid right adnexal mass (series 2/image 89), previously 6.9 x 4.3 cm when measured in a similar fashion. Other: No abdominopelvic ascites. Scattered minimal peritoneal nodularity beneath the anterior abdominal wall, including a 5 mm nodule beneath the left anterior abdominal wall (series 2/image 27) which previously measured 12 mm. Additional mild nodularity on series 2/images 32, 33, and 50. Musculoskeletal: Status post PLIF at L4-S1. Status post lateral fixation at L4-5. Status post anterior fixation at L5-S1. Mild degenerative changes of the lower thoracic spine. IMPRESSION: Improving bilateral adnexa masses in this patient with known ovarian cancer, as above. Suspected involvement of the sigmoid colon. No evidence of bowel obstruction. Improving peritoneal nodularity, as above. Additional ancillary findings as above. Electronically Signed   By: Charline Bills M.D.   On: 08/04/2022 01:27      08/16/2022 Pathology Results   CASE: [id number] PATIENT: Donnamae Jude Surgical Pathology Report  Clinical History: Advanced gyn malignancy (crm)  FINAL MICROSCOPIC DIAGNOSIS:  A. OMENTUM, RESECTION: - Positive for carcinoma  B. LIVER ADHESION, EXCISION: - Benign fibroadipose  tissue - Negative for carcinoma  C. SMALL BOWEL MESENTERIC IMPLANT, EXCISION: - Positive for carcinoma with extensive necrosis  D. UTERUS, CERVIX, BILATERAL FALLOPIAN TUBES AND OVARIES, AND APPENDIX, RESECTION: - Uterine serosa: Tumor deposits present - Cervix: Benign, nabothian cyst - Endometrium: Benign inactive endometrium, benign endometrial polyp - Myometrium: Leiomyoma - Right fallopian tube: Metastatic tumor deposits present - Right ovary: High-grade serous carcinoma - Left ovary: High-grade serous carcinoma - Benign appendix - See oncology table  E. COLON, RECTOSIGMOID, RESECTION: - Benign colonic mucosa - Margins free of carcinoma - Adherent fallopian tube with metastatic tumor deposits - 2 benign lymph nodes  F. RECTAL TUMOR IMPLANT, EXCISION: - Metastatic high-grade serous carcinoma   G. COLON, RECTOSIGMOID ANASTOMOTIC RINGS: - Benign anastomotic donuts  ONCOLOGY TABLE:   OVARY or FALLOPIAN TUBE or PRIMARY PERITONEUM: Resection  Procedure: Total abdominal hysterectomy, bilateral salpingo-oophorectomy, appendectomy, rectosigmoid resection, total omentectomy, small bowel nodule resection Specimen Integrity: Intact Tumor Site: Bilateral Tumor Size: Right ovary: 3.9 cm, left ovary: Approximately 1.2 cm Histologic Type: High-grade serous carcinoma Histologic Grade: High-grade Ovarian Surface Involvement: Present Fallopian Tube Surface Involvement: Present Implants:  Present, small bowel mesenteric and rectovaginal septum Lymphatic and/or Vascular Invasion: Not identified Other Tissue/ Organ Involvement: Fallopian tube, bilateral Largest Extrapelvic Peritoneal Focus: 1.4 cm, small bowel mesentery Peritoneal/Ascitic Fluid Involvement: Not applicable Chemotherapy Response Score (CRS): Not applicable, no known presurgical therapy Regional Lymph Nodes: Not applicable (no lymph nodes submitted or found) Distant Metastasis:      Distant Site(s) Involved: Not  applicable Pathologic Stage Classification (pTNM, AJCC 8th Edition): pT3b, pN[not assigned] Ancillary Studies: Can be performed upon request Representative Tumor Block: D8 Comment(s): Tumor is staged as pT3 given the presence of extrapelvic peritoneal deposits on small bowel mesentery less than 2cm (1.4 cm) ([id number])      PHYSICAL EXAMINATION: ECOG PERFORMANCE STATUS: 1 - Symptomatic but completely ambulatory  Vitals:   09/15/22 0905  BP: (!) 153/82  Pulse: 84  Resp: 18  Temp: 97.6 F (36.4 C)  SpO2: 100%   Filed Weights   09/15/22 0905  Weight: 176 lb (79.8 kg)    GENERAL:alert, no distress and comfortable SKIN: skin color, texture, turgor are normal, no rashes or significant lesions EYES: normal, Conjunctiva are pink and non-injected, sclera clear OROPHARYNX:no exudate, no erythema and lips, buccal mucosa, and tongue normal  NECK: supple, thyroid normal size, non-tender, without nodularity LYMPH:  no palpable lymphadenopathy in the cervical, axillary or inguinal LUNGS: clear to auscultation and percussion with normal breathing effort HEART: regular rate & rhythm and no murmurs and no lower extremity edema ABDOMEN:abdomen soft, non-tender and normal bowel sounds.  Noted well-healed surgical scar Musculoskeletal:no cyanosis of digits and no clubbing  NEURO: alert & oriented x 3 with fluent speech, no focal motor/sensory deficits  LABORATORY DATA:  I have reviewed the data as listed    Component Value Date/Time   NA 136 08/22/2022 0844   K 3.5 08/22/2022 0844   CL 102 08/22/2022 0844   CO2 27 08/22/2022 0844   GLUCOSE 150 (H) 08/22/2022 0844   BUN 10 08/22/2022 0844   CREATININE 0.73 08/22/2022 0844   CREATININE 0.82 07/17/2022 0936   CALCIUM 8.9 08/22/2022 0844   PROT 7.5 08/09/2022 1444   ALBUMIN 4.3 08/09/2022 1444   AST 23 08/09/2022 1444   AST 16 07/17/2022 0936   ALT 31 08/09/2022 1444   ALT 20 07/17/2022 0936   ALKPHOS 73 08/09/2022 1444   BILITOT  0.5 08/09/2022 1444   BILITOT 0.4 07/17/2022 0936   GFRNONAA >60 08/22/2022 0844   GFRNONAA >60 07/17/2022 0936   GFRAA >60 08/20/2019 1408    No results found for: "SPEP", "UPEP"  Lab Results  Component Value Date   WBC 3.1 (L) 09/15/2022   NEUTROABS 2.3 09/15/2022   HGB 12.3 09/15/2022   HCT 35.2 (L) 09/15/2022   MCV 97.8 09/15/2022   PLT 275 09/15/2022      Chemistry      Component Value Date/Time   NA 136 08/22/2022 0844   K 3.5 08/22/2022 0844   CL 102 08/22/2022 0844   CO2 27 08/22/2022 0844   BUN 10 08/22/2022 0844   CREATININE 0.73 08/22/2022 0844   CREATININE 0.82 07/17/2022 0936      Component Value Date/Time   CALCIUM 8.9 08/22/2022 0844   ALKPHOS 73 08/09/2022 1444   AST 23 08/09/2022 1444   AST 16 07/17/2022 0936   ALT 31 08/09/2022 1444   ALT 20 07/17/2022 0936   BILITOT 0.5 08/09/2022 1444   BILITOT 0.4 07/17/2022 0936       RADIOGRAPHIC STUDIES: I have personally reviewed  the radiological images as listed and agreed with the findings in the report. VAS [place] UPPER EXTREMITY VENOUS DUPLEX  Result Date: 08/19/2022 UPPER VENOUS STUDY  Patient Name:  RAHEEL PANGALLO  Date of Exam:   08/18/2022 Medical Rec #: [id number]        Accession #:    [id number] Date of Birth: 20-Jun-1955       Patient Gender: F Patient Age:   67 years Exam Location:  [facility] Procedure:      VAS [place] UPPER EXTREMITY VENOUS DUPLEX Referring Phys: MELISSA CROSS --------------------------------------------------------------------------------  Indications: Pain Risk Factors: Cancer patient / chemotherapy. Anticoagulation: Eliquis - stopped 3 days prior to surgery on 08/16/22. Comparison Study: No previous exams Performing Technologist: Jody Hill RVT, RDMS  Examination Guidelines: A complete evaluation includes B-mode imaging, spectral Doppler, color Doppler, and power Doppler as needed of all accessible portions of each vessel. Bilateral testing is considered an integral part of a  complete examination. Limited examinations for reoccurring indications may be performed as noted.  Right Findings: +----------+------------+---------+-----------+----------+-------+ RIGHT     CompressiblePhasicitySpontaneousPropertiesSummary +----------+------------+---------+-----------+----------+-------+ IJV           Full       Yes       Yes                      +----------+------------+---------+-----------+----------+-------+ Subclavian    Full       Yes       Yes                      +----------+------------+---------+-----------+----------+-------+ Axillary      Full       Yes       Yes                      +----------+------------+---------+-----------+----------+-------+ Brachial      Full       Yes       Yes                      +----------+------------+---------+-----------+----------+-------+ Radial        Full                                          +----------+------------+---------+-----------+----------+-------+ Ulnar         Full                                          +----------+------------+---------+-----------+----------+-------+ Cephalic      Full                                          +----------+------------+---------+-----------+----------+-------+ Basilic       Full       Yes       Yes                      +----------+------------+---------+-----------+----------+-------+  Left Findings: +----------+------------+---------+-----------+----------+-------+ LEFT      CompressiblePhasicitySpontaneousPropertiesSummary +----------+------------+---------+-----------+----------+-------+ Subclavian    Full       Yes       Yes                      +----------+------------+---------+-----------+----------+-------+  Summary:  Right: No evidence of deep vein thrombosis in the upper extremity. No evidence of superficial vein thrombosis in the upper extremity.  Left: No evidence of thrombosis in the subclavian.  *See  table(s) above for measurements and observations.  Diagnosing physician: Gerarda Fraction Electronically signed by Gerarda Fraction on 08/19/2022 at 9:10:51 AM.    Final

## 2022-09-15 NOTE — Patient Instructions (Signed)
[facility]  Discharge Instructions: Thank you for choosing [facility] to provide your oncology and hematology care.   If you have a lab appointment with the Cancer Center, please go directly to the Cancer Center and check in at the registration area.   Wear comfortable clothing and clothing appropriate for easy access to any Portacath or PICC line.   We strive to give you quality time with your provider. You may need to reschedule your appointment if you arrive late (15 or more minutes).  Arriving late affects you and other patients whose appointments are after yours.  Also, if you miss three or more appointments without notifying the office, you may be dismissed from the clinic at the provider's discretion.      For prescription refill requests, have your pharmacy contact our office and allow 72 hours for refills to be completed.    Today you received the following chemotherapy and/or immunotherapy agents: Paclitaxel, Carboplatin.       To help prevent nausea and vomiting after your treatment, we encourage you to take your nausea medication as directed.  BELOW ARE SYMPTOMS THAT SHOULD BE REPORTED IMMEDIATELY: *FEVER GREATER THAN 100.4 F (38 C) OR HIGHER *CHILLS OR SWEATING *NAUSEA AND VOMITING THAT IS NOT CONTROLLED WITH YOUR NAUSEA MEDICATION *UNUSUAL SHORTNESS OF BREATH *UNUSUAL BRUISING OR BLEEDING *URINARY PROBLEMS (pain or burning when urinating, or frequent urination) *BOWEL PROBLEMS (unusual diarrhea, constipation, pain near the anus) TENDERNESS IN MOUTH AND THROAT WITH OR WITHOUT PRESENCE OF ULCERS (sore throat, sores in mouth, or a toothache) UNUSUAL RASH, SWELLING OR PAIN  UNUSUAL VAGINAL DISCHARGE OR ITCHING   Items with * indicate a potential emergency and should be followed up as soon as possible or go to the Emergency Department if any problems should occur.  Please show the CHEMOTHERAPY ALERT CARD or IMMUNOTHERAPY  ALERT CARD at check-in to the Emergency Department and triage nurse.  Should you have questions after your visit or need to cancel or reschedule your appointment, please contact [facility]  Dept: [phone]  and follow the prompts.  Office hours are 8:00 a.m. to 4:30 p.m. Monday - Friday. Please note that voicemails left after 4:00 p.m. may not be returned until the following business day.  We are closed weekends and major holidays. You have access to a nurse at all times for urgent questions. Please call the main number to the clinic Dept: [phone] and follow the prompts.   For any non-urgent questions, you may also contact your provider using MyChart. We now offer e-Visits for anyone 18 and older to request care online for non-urgent symptoms. For details visit [email or web address].   Also download the MyChart app! Go to the app store, search "MyChart", open the app, select , and log in with your MyChart username and password.   

## 2022-09-16 LAB — CA 125: Cancer Antigen (CA) 125: 20.1 U/mL (ref 0.0–38.1)

## 2022-09-18 ENCOUNTER — Inpatient Hospital Stay: Payer: 59 | Admitting: Licensed Clinical Social Worker

## 2022-09-18 NOTE — Progress Notes (Signed)
[facility] Clinical Social Work  Clinical Social Work was referred by nurse for assessment of psychosocial needs.  Clinical Social Worker contacted patient by phone to offer support and assess for needs.   Per pt, she is having trouble with her rent for this month due to increased expenses while moving (two rents this month, moving costs). She has already tried [employer] and is not able to receive assistance from them.  CSW discussed potential assistance through cancer foundations, although pt will need to apply and they will take time. Pt was agreeable to this and asked for information to be e-mailed. CSW sent [employer], [employer], and [employer] information to patient. Also encouraged pt to reach out to S. Yetta Flock to determine if she can receive other assistance.  Pt encouraged to contact CSW for assistance applying or for other questions.     Rosann Gorum E Verdun Rackley, LCSW  Clinical Social Worker [facility]

## 2022-09-19 ENCOUNTER — Other Ambulatory Visit: Payer: Self-pay

## 2022-09-19 ENCOUNTER — Emergency Department (HOSPITAL_COMMUNITY): Payer: 59

## 2022-09-19 ENCOUNTER — Emergency Department (HOSPITAL_COMMUNITY)
Admission: EM | Admit: 2022-09-19 | Discharge: 2022-09-19 | Disposition: A | Discharge status: Home / Self Care | Payer: 59 | Attending: Emergency Medicine | Admitting: Emergency Medicine

## 2022-09-19 ENCOUNTER — Encounter (HOSPITAL_COMMUNITY): Payer: Self-pay

## 2022-09-19 DIAGNOSIS — Z7901 Long term (current) use of anticoagulants: Secondary | ICD-10-CM | POA: Diagnosis not present

## 2022-09-19 DIAGNOSIS — D72819 Decreased white blood cell count, unspecified: Secondary | ICD-10-CM | POA: Insufficient documentation

## 2022-09-19 DIAGNOSIS — R519 Headache, unspecified: Secondary | ICD-10-CM | POA: Diagnosis not present

## 2022-09-19 DIAGNOSIS — R079 Chest pain, unspecified: Secondary | ICD-10-CM | POA: Diagnosis not present

## 2022-09-19 DIAGNOSIS — R42 Dizziness and giddiness: Secondary | ICD-10-CM | POA: Diagnosis not present

## 2022-09-19 DIAGNOSIS — R0789 Other chest pain: Secondary | ICD-10-CM | POA: Diagnosis not present

## 2022-09-19 DIAGNOSIS — R252 Cramp and spasm: Secondary | ICD-10-CM | POA: Diagnosis not present

## 2022-09-19 DIAGNOSIS — D649 Anemia, unspecified: Secondary | ICD-10-CM | POA: Insufficient documentation

## 2022-09-19 LAB — COMPREHENSIVE METABOLIC PANEL
ALT: 26 U/L (ref 0–44)
AST: 21 U/L (ref 15–41)
Albumin: 4 g/dL (ref 3.5–5.0)
Alkaline Phosphatase: 71 U/L (ref 38–126)
Anion gap: 8 (ref 5–15)
BUN: 19 mg/dL (ref 8–23)
CO2: 27 mmol/L (ref 22–32)
Calcium: 9.3 mg/dL (ref 8.9–10.3)
Chloride: 101 mmol/L (ref 98–111)
Creatinine, Ser: 0.71 mg/dL (ref 0.44–1.00)
GFR, Estimated: >60 mL/min (ref >60)
Glucose, Bld: 101 mg/dL — ABNORMAL HIGH (ref 70–99)
Potassium: 3.8 mmol/L (ref 3.5–5.1)
Sodium: 136 mmol/L (ref 135–145)
Total Bilirubin: 0.9 mg/dL (ref 0.3–1.2)
Total Protein: 7.2 g/dL (ref 6.5–8.1)

## 2022-09-19 LAB — CBC WITH DIFFERENTIAL/PLATELET
Abs Immature Granulocytes: 0 10*3/uL (ref 0.00–0.07)
Basophils Absolute: 0 10*3/uL (ref 0.0–0.1)
Basophils Relative: 1 %
Eosinophils Absolute: 0.1 10*3/uL (ref 0.0–0.5)
Eosinophils Relative: 2 %
HCT: 34.5 % — ABNORMAL LOW (ref 36.0–46.0)
Hemoglobin: 11.5 g/dL — ABNORMAL LOW (ref 12.0–15.0)
Immature Granulocytes: 0 %
Lymphocytes Relative: 39 %
Lymphs Abs: 1 10*3/uL (ref 0.7–4.0)
MCH: 33.2 pg (ref 26.0–34.0)
MCHC: 33.3 g/dL (ref 30.0–36.0)
MCV: 99.7 fL (ref 80.0–100.0)
Monocytes Absolute: 0.2 10*3/uL (ref 0.1–1.0)
Monocytes Relative: 7 %
Neutro Abs: 1.3 10*3/uL — ABNORMAL LOW (ref 1.7–7.7)
Neutrophils Relative %: 51 %
Platelets: 214 10*3/uL (ref 150–400)
RBC: 3.46 MIL/uL — ABNORMAL LOW (ref 3.87–5.11)
RDW: 13.2 % (ref 11.5–15.5)
WBC: 2.6 10*3/uL — ABNORMAL LOW (ref 4.0–10.5)
nRBC: 0 % (ref 0.0–0.2)

## 2022-09-19 LAB — CK: Total CK: 107 U/L (ref 38–234)

## 2022-09-19 LAB — MAGNESIUM: Magnesium: 2.1 mg/dL (ref 1.7–2.4)

## 2022-09-19 LAB — TROPONIN I (HIGH SENSITIVITY)
Troponin I (High Sensitivity): 2 ng/L (ref <18)
Troponin I (High Sensitivity): <2 ng/L (ref <18)

## 2022-09-19 LAB — D-DIMER, QUANTITATIVE: D-Dimer, Quant: 0.39 ug/mL-FEU (ref 0.00–0.50)

## 2022-09-19 MED ORDER — LACTATED RINGERS IV BOLUS
1000.0000 mL | Freq: Once | INTRAVENOUS | Status: AC
  Administered 2022-09-19: 1000 mL via INTRAVENOUS

## 2022-09-19 NOTE — ED Notes (Signed)
RN assisted patient to bathroom. She was able to ambulate independently without complaints of respiratory issues, no SOB, no chest tightness her only complaint was muscle spasms, MD in hallway and aware.

## 2022-09-19 NOTE — ED Notes (Signed)
Pt d/c with instructions, all questions answered. Stable condition. NAD at time of d/c

## 2022-09-19 NOTE — Discharge Instructions (Signed)
If you develop new or worsening headache, dizziness, chest pain, shortness of breath, or any other new/concerning symptoms then return to the ER or call 911.

## 2022-09-19 NOTE — ED Triage Notes (Signed)
C/o nausea dizziness muscle spasms throughout the night and CP started this morning. Denies emesis or diarrhea. Hx CA

## 2022-09-19 NOTE — ED Notes (Signed)
Patient transported to CT 

## 2022-09-19 NOTE — ED Provider Notes (Signed)
[facility] Provider Note   CSN: [id number] Arrival date & time: 09/19/22  0920     History  Chief Complaint  Patient presents with   Chest Pain   Dizziness    [name] is a 67 y.o. female.  HPI 67 year old female presents with a chief complaint of dizziness.  She has multiple complaints and her symptoms seem to start last night.  She started having bilateral leg cramping last night.  She went to bed around 9 PM last night.  When she woke up (cannot remember what time she woke up) she was feeling dizzy.  Feels like things are spinning but she also felt like she was lightheaded.  She has a history of vertigo and this does feel somewhat similar but may be more severe.  She has not had any trouble walking.  She denies any new visual complaints or weakness/numbness in her extremities.  She does have pain/discomfort to the base of her head/upper neck.  She has also been feeling some chest pressure since she woke up and was transiently short of breath though this is gone.  No pleuritic symptoms.  She most recently had chemotherapy 3 days ago but has not typically had symptoms similar to this after chemotherapy.  No vomiting, diarrhea or abdominal pain.  A little over a month ago she had a laparotomy by Dr. Pricilla Holm with multiple resections.  Home Medications Prior to Admission medications   Medication Sig Start Date End Date Taking? Authorizing Provider  albuterol (VENTOLIN HFA) 108 (90 Base) MCG/ACT inhaler Inhale 2 puffs into the lungs every 6 (six) hours as needed for shortness of breath. 07/30/21   [provider]  amphetamine-dextroamphetamine (ADDERALL) 20 MG tablet Take 10-20 mg by mouth See admin instructions. Take 20 mg by mouth in the morning and 10 mg between 1-2 PM daily    [provider]  apixaban (ELIQUIS) 2.5 MG TABS tablet Take 1 tablet (2.5 mg total) by mouth 2 (two) times daily. 08/23/22   Warner Mccreedy D, NP   Ascorbic Acid (VITAMIN C) 1000 MG tablet Take 1,000 mg by mouth daily.    [provider]  ASHWAGANDHA PO Take 1 capsule by mouth daily.    [provider]  BLACK CURRANT SEED OIL PO Take 1 capsule by mouth daily.    [provider]  dexamethasone (DECADRON) 4 MG tablet Take 2 tabs at the night before and 2 tab the morning of chemotherapy, every 3 weeks, by mouth x 6 cycles 05/23/22   Artis Delay, MD  diclofenac Sodium (VOLTAREN) 1 % GEL Apply 2 g topically 2 (two) times daily. 08/12/19   [provider]  docusate sodium (COLACE) 100 MG capsule Take 1 capsule (100 mg total) by mouth 2 (two) times daily. Do not take if having diarrhea 08/22/22   Warner Mccreedy D, NP  lidocaine-prilocaine (EMLA) cream Apply to affected area once Patient taking differently: Apply 1 Application topically daily as needed (port access). Apply to affected area once 05/23/22   Artis Delay, MD  LORazepam (ATIVAN) 0.5 MG tablet Take 0.5 mg by mouth 3 (three) times daily as needed for anxiety.    [provider]  magic mouthwash w/lidocaine SOLN Take 5 mLs by mouth 4 (four) times daily. Take 5 mls 4 x day for 7 days, swish and swallow. 06/30/22   Artis Delay, MD  meclizine (ANTIVERT) 12.5 MG tablet Take 12.5 mg by mouth 3 (three) times daily  as needed for dizziness. 08/02/21   [provider]  Melatonin 10 MG TABS Take 10 mg by mouth at bedtime as needed (sleep).    [provider]  Multiple Vitamin (MULTIVITAMIN WITH MINERALS) TABS tablet Take 1 tablet by mouth daily.    [provider]  mupirocin ointment (BACTROBAN) 2 % Apply 1 Application topically 3 (three) times daily as needed (HS flares). 08/02/21   [provider]  naloxone [facility]) nasal spray 4 mg/0.1 mL Place 1 spray into the nose as needed (opioid overdose).    [provider]  ondansetron (ZOFRAN) 8 MG tablet Take 8 mg by mouth every 8 (eight) hours as needed for nausea or vomiting.     [provider]  OVER THE COUNTER MEDICATION Take 2 capsules by mouth daily. [place] advanced supplement    [provider]  oxyCODONE ER (XTAMPZA ER) 13.5 MG C12A Take 13.5 mg by mouth in the morning, at noon, and at bedtime.    [provider]  Oxycodone HCl 20 MG TABS Take 20 mg by mouth 5 (five) times daily as needed (pain).    [provider]  polyethylene glycol (MIRALAX / GLYCOLAX) 17 g packet Take 17 g by mouth daily.    [provider]  prochlorperazine (COMPAZINE) 10 MG tablet Take 10 mg by mouth every 6 (six) hours as needed for nausea or vomiting.    [provider]  Vitamin A 2400 MCG (8000 UT) CAPS Take 2,400 mcg by mouth daily. Patient not taking: Reported on 09/12/2022    [provider]  Vitamin D, Ergocalciferol, (DRISDOL) 1.25 MG (50000 UNIT) CAPS capsule Take 50,000 Units by mouth every Sunday.    [provider]  Monte Fantasia INHUB 250-50 MCG/ACT AEPB Inhale 1 puff into the lungs daily.    [provider]  zinc gluconate 50 MG tablet Take 50 mg by mouth daily. Patient not taking: Reported on 09/12/2022    [provider]      Allergies    Cymbalta [duloxetine hcl], Cyclobenzaprine hcl, Celexa  [citalopram hydrobromide], Fluticasone furoate-vilanterol, Gabapentin, Opana  [oxymorphone hcl], and Tramadol    Review of Systems   Review of Systems  Eyes:  Negative for visual disturbance.  Cardiovascular:  Positive for chest pain.  Gastrointestinal:  Negative for abdominal pain, diarrhea and vomiting.  Genitourinary:  Negative for dysuria.  Musculoskeletal:  Positive for myalgias.  Neurological:  Positive for headaches. Negative for weakness and numbness.    Physical Exam Updated Vital Signs BP (!) 152/91 (BP Location: Left Arm)   Pulse 89   Temp 98.2 F (36.8 C) (Oral)   Resp 17   SpO2 100%  Physical Exam Vitals and nursing note reviewed.  Constitutional:      General: She is not  in acute distress.    Appearance: She is well-developed. She is not ill-appearing or diaphoretic.  HENT:     Head: Normocephalic and atraumatic.  Eyes:     Extraocular Movements: Extraocular movements intact.     Pupils: Pupils are equal, round, and reactive to light.  Cardiovascular:     Rate and Rhythm: Normal rate and regular rhythm.     Heart sounds: Normal heart sounds.  Pulmonary:     Effort: Pulmonary effort is normal.     Breath sounds: Normal breath sounds.  Abdominal:     Palpations: Abdomen is soft.     Tenderness: There is no abdominal tenderness.  Musculoskeletal:     Right  lower leg: No tenderness. No edema.     Left lower leg: No tenderness. No edema.     Comments: No significant swelling or tenderness to bilateral lower legs/thighs.  Skin:    General: Skin is warm and dry.  Neurological:     Mental Status: She is alert.     Comments: CN 3-12 grossly intact. 5/5 strength in all 4 extremities. Grossly normal sensation. Normal finger to nose. Normal gait.     ED Results / Procedures / Treatments   Labs (all labs ordered are listed, but only abnormal results are displayed) Labs Reviewed  COMPREHENSIVE METABOLIC PANEL - Abnormal; Notable for the following components:      Result Value   Glucose, Bld 101 (*)    All other components within normal limits  CBC WITH DIFFERENTIAL/PLATELET - Abnormal; Notable for the following components:   WBC 2.6 (*)    RBC 3.46 (*)    Hemoglobin 11.5 (*)    HCT 34.5 (*)    Neutro Abs 1.3 (*)    All other components within normal limits  CK  MAGNESIUM  D-DIMER, QUANTITATIVE  TROPONIN I (HIGH SENSITIVITY)  TROPONIN I (HIGH SENSITIVITY)    EKG EKG Interpretation  Date/Time:  Tuesday Sep 19 2022 10:03:00 EDT Ventricular Rate:  73 PR Interval:  146 QRS Duration: 81 QT Interval:  370 QTC Calculation: 408 R Axis:   67 Text Interpretation: Sinus rhythm nonspecific ST segments Confirmed by Pricilla Loveless [phone]) on 09/19/2022  10:38:21 AM  Radiology CT Head Wo Contrast  Result Date: 09/19/2022 CLINICAL DATA:  Headache EXAM: CT HEAD WITHOUT CONTRAST TECHNIQUE: Contiguous axial images were obtained from the base of the skull through the vertex without intravenous contrast. RADIATION DOSE REDUCTION: This exam was performed according to the departmental dose-optimization program which includes automated exposure control, adjustment of the mA and/or kV according to patient size and/or use of iterative reconstruction technique. COMPARISON:  CT Head 08/12/14, MR Head 10/18/21 FINDINGS: Brain: No evidence of acute infarction, hemorrhage, hydrocephalus, extra-axial collection or mass lesion/mass effect. There are subcortical hypodensities, which are nonspecific and could represent changes related to patient's history demyelinating disease or chronic microvascular ischemic change. Vascular: No hyperdense vessel or unexpected calcification. Skull: Normal. Negative for fracture or focal lesion. Sinuses/Orbits: No middle ear or mastoid effusion. Paranasal sinuses are clear. Orbits are unremarkable. Other: None. IMPRESSION: 1. No acute intracranial abnormality. 2. Multiple subcortical hypodensities are nonspecific and could represent changes related to patient's history demyelinating disease or chronic microvascular ischemic change. Electronically Signed   By: Lorenza Cambridge M.D.   On: 09/19/2022 11:08   DG Chest 2 View  Result Date: 09/19/2022 CLINICAL DATA:  Chest pain EXAM: CHEST - 2 VIEW COMPARISON:  04/27/2021 FINDINGS: Right-sided chest port in position with distal tip terminating at the superior cavoatrial junction. Heart size is normal. No focal airspace consolidation, pleural effusion, or pneumothorax. No acute osseous abnormality. IMPRESSION: No active cardiopulmonary disease. Electronically Signed   By: Duanne Guess D.O.   On: 09/19/2022 10:39    Procedures Procedures    Medications Ordered in ED Medications  lactated  ringers bolus 1,000 mL (0 mLs Intravenous Stopped 09/19/22 1153)    ED Course/ Medical Decision Making/ A&P                             Medical Decision Making Amount and/or Complexity of Data Reviewed Labs: ordered.    Details: Normal troponin x  2. Leukopenia a little worse than last time, but no infectious source. Stable anemia. Unremarkable electrolytes.  Radiology: ordered and independent interpretation performed.    Details: CT head - no head bleed CXR - no pneumonia ECG/medicine tests: ordered and independent interpretation performed.    Details: No ischemia   Patient's chief complaint is dizziness.She feels better after fluids and was able get up and walk without any current dizziness. Given this, I have a low suspicion that she is having an acute CNS emergency such as stroke.  I doubt that this is even vertigo. Troponins negative x 2. 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 She has some on and off leg cramps but feels well enough for discharge. I did discuss we could consider MRI given her MS history and dizziness. No focal neuro deficits. She would prefer to hold off, and given she's ambulatory and better symptomatically I think this is reasonable. Will d/c home with return precautions.         Final Clinical Impression(s) / ED Diagnoses Final diagnoses:  Dizziness  Muscle cramp  Nonspecific chest pain    Rx / DC Orders ED Discharge Orders     None         Pricilla Loveless, MD 09/19/22 1453

## 2022-09-20 ENCOUNTER — Encounter: Payer: Self-pay | Admitting: Hematology and Oncology

## 2022-09-20 NOTE — Progress Notes (Signed)
Received call from patient after receiving my card regarding financial concerns.  Introduced myself as Dance movement psychotherapist and to offer available resources. Discussed one-time $1000 Marketing executive to assist with personal expenses while going through treatment. Advised what is needed to apply as well as what will be needed for the concerned expense. Advised how to submit information and then documents will be sent via docusign.  She has my card for any additional financial questions or concerns.

## 2022-09-22 ENCOUNTER — Encounter: Payer: Self-pay | Admitting: Hematology and Oncology

## 2022-09-22 DIAGNOSIS — N201 Calculus of ureter: Secondary | ICD-10-CM | POA: Diagnosis not present

## 2022-09-22 DIAGNOSIS — N130 Hydronephrosis with ureteropelvic junction obstruction: Secondary | ICD-10-CM | POA: Diagnosis not present

## 2022-09-22 NOTE — Progress Notes (Signed)
Received patient income by email for [employer].  Will have patient sign grant documents on 5/23 at next visit at check-in and be given grant packet and my card for any additional financial questions or concerns.

## 2022-09-28 ENCOUNTER — Inpatient Hospital Stay: Payer: 59

## 2022-09-28 ENCOUNTER — Encounter: Payer: Self-pay | Admitting: Genetic Counselor

## 2022-09-28 ENCOUNTER — Inpatient Hospital Stay (HOSPITAL_BASED_OUTPATIENT_CLINIC_OR_DEPARTMENT_OTHER): Payer: 59 | Admitting: Genetic Counselor

## 2022-09-28 DIAGNOSIS — Z803 Family history of malignant neoplasm of breast: Secondary | ICD-10-CM

## 2022-09-28 DIAGNOSIS — C563 Malignant neoplasm of bilateral ovaries: Secondary | ICD-10-CM

## 2022-09-28 NOTE — Progress Notes (Signed)
REFERRING PROVIDER: Carver Fila, MD [address] [place],  [place] [address]  PRIMARY PROVIDER:  Johny Blamer, MD  PRIMARY REASON FOR VISIT:  Encounter Diagnoses  Name Primary?   Bilateral primary ovarian cancer (HCC) Yes   Family history of breast cancer     HISTORY OF PRESENT ILLNESS:   [name], a 67 y.o. female, was seen for a [place] cancer genetics consultation at the request of Dr. Pricilla Holm due to a personal history of ovarian cancer.  [name] presents to clinic today to discuss the possibility of a hereditary predisposition to cancer, to discuss genetic testing, and to further clarify her future cancer risks, as well as potential cancer risks for family members.   In January 2024, at the age of 67, [name] was diagnosed with high grade serous ovarian cancer (bilateral).   CANCER HISTORY:  Oncology History Overview Note  High grade serous   Bilateral primary ovarian cancer (HCC)  04/11/2022 Imaging   CT of the abdomen and pelvis on 04/11/2022 reveals a left adnexal masslike area measuring 5.9 x 3.6 cm.  Margins of this mass are irregular.  There is small fluid in the cul-de-sac as well as fullness of soft tissue about the right adnexa.  Discrete uterine structure is not identified.  Left upper quadrant nodules beneath the left hemidiaphragm (1 anterior to the spleen and the other to the stomach).  Omental/peritoneal nodularity and multiple perihepatic nodules are noted    04/18/2022 Tumor Marker   Patient's tumor was tested for the following markers: CA-125. Results of the tumor marker test revealed 123.   04/19/2022 Initial Diagnosis   Carcinomatosis (HCC)   04/28/2022 Imaging   1. Bulky bilateral ovarian masses and nodules, left-greater-than-right, which are essentially confluent with adjacent pelvic soft tissue nodularity and not significantly changed compared to prior examination dated 04/11/2022. 2. Extensive pelvic peritoneal thickening and  nodularity. Multiple small peritoneal nodules throughout the abdomen and pelvis. Findings are consistent with peritoneal metastatic disease and likewise not significantly changed. 3. Small volume of loculated appearing fluid in the low pelvis. 4. 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 The mid to distal right ureter is obstructed by right ovarian mass and or soft tissue nodularity. 5. Calculus within the most distal left ureterovesicular junction or just within the bladder lumen measuring 0.7 cm, unchanged. No associated left-sided hydronephrosis. 6. Soft tissue attenuation nodule of the body of the right adrenal gland. Notably, this was also soft tissue attenuation on prior noncontrast examination (i.e. not definitively macroscopic fat containing) although unchanged compared to prior examinations dating back to 2018 and almost certainly a benign adenoma. Attention on follow-up. 7. No evidence of metastatic disease in the chest. 8. Mild diffuse bilateral bronchial wall thickening. Background of very fine centrilobular nodularity, most concentrated in the lung apices. Findings are most consistent with smoking-related respiratory bronchiolitis.     05/18/2022 Procedure   Ultrasound-guided core biopsy performed of a 10 mm soft tissue peritoneal mass in the right upper quadrant just superficial to the right lobe of the liver. The procedure was performed under general anesthesia immediately following Port-A-Cath placement.   05/18/2022 Procedure   Placement of single lumen port a cath via right internal jugular vein. The catheter tip lies at the cavo-atrial junction. A power injectable port a cath was placed and is ready for immediate use.     05/18/2022 Pathology Results   A. PERITONEAL  MASS, RIGHT, BIOPSY:  - Metastatic high grade serous carcinoma (see comment)   COMMENT:   Appropriately  controlled immunohistochemical stains reveal tumor cells are positive for PAX8, WT1 and p53.  The findings support the above interpretation.  This case was reviewed with Dr. Kenard Gower who agrees with the above diagnosis.  A p16 stain is pending and will be reported in an addendum.    05/23/2022 Cancer Staging   Staging form: Ovary, Fallopian Tube, and Primary Peritoneal Carcinoma, AJCC 8th Edition - Clinical stage from 05/23/2022: FIGO Stage IIIC (cT3c, cN0, cM0) - Signed by Artis Delay, MD on 05/23/2022 Stage prefix: Initial diagnosis   05/29/2022 Imaging   1. Complex bilateral adnexal masses, as described above, consistent with the patient's known malignancy. 2. Subsequent marked severity mass effect on the distal bilateral common iliac veins, increased in severity when compared to the prior exam. 3. Additional findings consistent with peritoneal metastasis within the pelvis. 4. Findings consistent with pelvic congestion syndrome. 5. Postoperative changes within the mid and lower lumbar spine. 6. Aortic atherosclerosis. 7. No evidence of venous thrombus within the abdomen, pelvis or proximal bilateral lower extremities.   Aortic Atherosclerosis (ICD10-I70.0).   06/02/2022 -  Chemotherapy   Patient is on Treatment Plan : OVARIAN Carboplatin (AUC 6) + Paclitaxel (175) q21d X 6 Cycles     06/26/2022 Tumor Marker   Patient's tumor was tested for the following markers: CA-125. Results of the tumor marker test revealed 134.   08/04/2022 Imaging   CT ABDOMEN PELVIS W CONTRAST  Result Date: 08/04/2022 CLINICAL DATA:  Ovarian cancer, chemotherapy in progress, for restaging EXAM: CT ABDOMEN AND PELVIS WITH CONTRAST TECHNIQUE: Multidetector CT imaging of the abdomen and pelvis was performed using the standard protocol following bolus administration of intravenous contrast. RADIATION DOSE REDUCTION: This exam was performed according to the departmental dose-optimization program which includes automated  exposure control, adjustment of the mA and/or kV according to patient size and/or use of iterative reconstruction technique. CONTRAST:  OMNIPAQUE IOHEXOL 300 MG/ML  SOLN COMPARISON:  CT venogram abdomen/pelvis dated 05/29/2022. CT abdomen/pelvis dated 04/26/2022. FINDINGS: Lower chest: Lung bases are clear. Hepatobiliary: Liver is within normal limits. Gallbladder is unremarkable. No intrahepatic or extrahepatic ductal dilatation. Pancreas: Within normal limits. Spleen: Within normal limits. Adrenals/Urinary Tract: 2.4 cm right adrenal nodule (series 2/image 17), previously 2.0 cm in 2018, likely reflecting a benign adrenal adenoma. Left adrenal gland is within normal limits. Left kidney is within normal limits. 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 Associated extrinsic compression at the level of the right adnexal mass (described below). Mildly thick-walled bladder, although underdistended. Stable 6 mm left UVJ calcification (series 2/image 68). Stomach/Bowel: Stomach is within normal limits. No evidence of bowel obstruction. Normal appendix (series 2/image 47). Left adnexal mass (described below) is favored to abut and directly invade the left lateral wall of the sigmoid colon (series 2/image 57). Vascular/Lymphatic: No evidence of abdominal aortic aneurysm. Atherosclerotic calcifications of the abdominal aorta and branch vessels. No suspicious abdominopelvic lymphadenopathy. Reproductive: Heterogeneous uterus with associated surface nodularity (series 2/image 65), likely reflecting peritoneal disease. This appearance is improved from the prior. 3.9 x 3.3 cm left adnexal mass (series 2/image 60), previously 5.3 x 3.9 cm, with extension along the sigmoid mesocolon (series 2/image 56) and suspected involvement of the sigmoid colon (series 2/image 57). 5.6 x 2.7 cm mixed cystic/solid right adnexal mass (series  2/image 89), previously 6.9 x 4.3 cm  when measured in a similar fashion. Other: No abdominopelvic ascites. Scattered minimal peritoneal nodularity beneath the anterior abdominal wall, including a 5 mm nodule beneath the left anterior abdominal wall (series 2/image 27) which previously measured 12 mm. Additional mild nodularity on series 2/images 32, 33, and 50. Musculoskeletal: Status post PLIF at L4-S1. Status post lateral fixation at L4-5. Status post anterior fixation at L5-S1. Mild degenerative changes of the lower thoracic spine. IMPRESSION: Improving bilateral adnexa masses in this patient with known ovarian cancer, as above. Suspected involvement of the sigmoid colon. No evidence of bowel obstruction. Improving peritoneal nodularity, as above. Additional ancillary findings as above. Electronically Signed   By: Charline Bills M.D.   On: 08/04/2022 01:27      08/16/2022 Pathology Results   CASE: [id number] PATIENT: Donnamae Jude Surgical Pathology Report  Clinical History: Advanced gyn malignancy (crm)  FINAL MICROSCOPIC DIAGNOSIS:  A. OMENTUM, RESECTION: - Positive for carcinoma  B. LIVER ADHESION, EXCISION: - Benign fibroadipose tissue - Negative for carcinoma  C. SMALL BOWEL MESENTERIC IMPLANT, EXCISION: - Positive for carcinoma with extensive necrosis  D. UTERUS, CERVIX, BILATERAL FALLOPIAN TUBES AND OVARIES, AND APPENDIX, RESECTION: - Uterine serosa: Tumor deposits present - Cervix: Benign, nabothian cyst - Endometrium: Benign inactive endometrium, benign endometrial polyp - Myometrium: Leiomyoma - Right fallopian tube: Metastatic tumor deposits present - Right ovary: High-grade serous carcinoma - Left ovary: High-grade serous carcinoma - Benign appendix - See oncology table  E. COLON, RECTOSIGMOID, RESECTION: - Benign colonic mucosa - Margins free of carcinoma - Adherent fallopian tube with metastatic tumor deposits - 2 benign lymph nodes  F. RECTAL TUMOR  IMPLANT, EXCISION: - Metastatic high-grade serous carcinoma   G. COLON, RECTOSIGMOID ANASTOMOTIC RINGS: - Benign anastomotic donuts  ONCOLOGY TABLE:   OVARY or FALLOPIAN TUBE or PRIMARY PERITONEUM: Resection  Procedure: Total abdominal hysterectomy, bilateral salpingo-oophorectomy, appendectomy, rectosigmoid resection, total omentectomy, small bowel nodule resection Specimen Integrity: Intact Tumor Site: Bilateral Tumor Size: Right ovary: 3.9 cm, left ovary: Approximately 1.2 cm Histologic Type: High-grade serous carcinoma Histologic Grade: High-grade Ovarian Surface Involvement: Present Fallopian Tube Surface Involvement: Present Implants: Present, small bowel mesenteric and rectovaginal septum Lymphatic and/or Vascular Invasion: Not identified Other Tissue/ Organ Involvement: Fallopian tube, bilateral Largest Extrapelvic Peritoneal Focus: 1.4 cm, small bowel mesentery Peritoneal/Ascitic Fluid Involvement: Not applicable Chemotherapy Response Score (CRS): Not applicable, no known presurgical therapy Regional Lymph Nodes: Not applicable (no lymph nodes submitted or found) Distant Metastasis:      Distant Site(s) Involved: Not applicable Pathologic Stage Classification (pTNM, AJCC 8th Edition): pT3b, pN[not assigned] Ancillary Studies: Can be performed upon request Representative Tumor Block: D8 Comment(s): Tumor is staged as pT3 given the presence of extrapelvic peritoneal deposits on small bowel mesentery less than 2cm (1.4 cm) ([id number])    09/18/2022 Tumor Marker   Patient's tumor was tested for the following markers: CA-125. Results of the tumor marker test revealed 36.8.     RISK FACTORS:  Breast biopies: 0. Colonoscopy: yes; less than 10 polyps total Hysterectomy: yes.  Ovaries intact: no.  Menarche was at age 7.  First live birth at age 58.  HRT use: 0 years. Dermatology screening: as needed for skin tags   Past Medical History:  Diagnosis Date    Anxiety    Asthma    Back problem    disc disease, chronic low back pain, right leg pain, s/p discectomy 99   Cancer (HCC)    ovarian cancer   Depression    Diastolic dysfunction  with elevated LVEDP at cath   Endometriosis    Fibroids    hx of    GERD (gastroesophageal reflux disease)    Heart murmur    Hepatitis C    treated 8-9 years ago   Hidradenitis suppurativa    History of kidney stones    Hypertension    MS (multiple sclerosis) (HCC)    Neuromuscular disorder (HCC)    Mulitple sclerosis   Osteoarthritis    Pre-diabetes    Prediabetes    RA (rheumatoid arthritis) (HCC)    Rotator cuff tear    right shoulder    Syncope     Past Surgical History:  Procedure Laterality Date   ANGIOPLASTY     BACK SURGERY     lumbar surgery x 2 done in [place] and [place]   COLON RESECTION SIGMOID  08/16/2022   Procedure: COLON RESECTION SIGMOID;  Surgeon: Carver Fila, MD;  Location: [facility];  Service: Gynecology;;   COLONOSCOPY  07/2010   tubular adenoma   COLONOSCOPY  08/2015   CYSTOSCOPY N/A 08/16/2022   Procedure: CYSTOSCOPY;  Surgeon: Carver Fila, MD;  Location: [facility];  Service: Gynecology;  Laterality: N/A;   DISKECTOMY     HYSTERECTOMY ABDOMINAL WITH SALPINGO-OOPHORECTOMY  08/16/2022   Procedure: HYSTERECTOMY ABDOMINAL WITH SALPINGO-OOPHORECTOMY;  Surgeon: Carver Fila, MD;  Location: [facility];  Service: Gynecology;;   IR IMAGING GUIDED PORT INSERTION  05/18/2022   IR US GUIDE BX ASP/DRAIN  05/18/2022   LAPAROSCOPY N/A 08/16/2022   Procedure: LAPAROSCOPY DIAGNOSTIC;  Surgeon: Carver Fila, MD;  Location: [facility];  Service: Gynecology;  Laterality: N/A;   laparoscopy for endometriosis     left shoulder scope     RADIOLOGY WITH ANESTHESIA N/A 05/18/2022   Procedure: IR WITH ANESTHESIA PORT AND BIOPSY;  Surgeon: Irish Lack, MD;  Location: [facility];  Service: Radiology;  Laterality: N/A;   ROTATOR CUFF REPAIR Left    Spinal Fusion  12/2016    L4-S1    FAMILY HISTORY:  We obtained a detailed, 4-generation family history.  Significant diagnoses are listed below: Family History  Problem Relation Age of Onset   Breast cancer Maternal Aunt        dx <50   Stomach cancer Maternal Grandfather        mets to liver? dx after 73   Breast cancer Cousin        mat female cousin; dx unknown age   Colon cancer Cousin 47       mat female cousin; mets   Leukemia Cousin 46       mat female cousin     [name] is unaware of previous family history of genetic testing for hereditary cancer risks. There is no reported Ashkenazi Jewish ancestry. There is no known consanguinity.  GENETIC COUNSELING ASSESSMENT: [name] is a 67 y.o. female with a personal history of ovarian cancer which is somewhat suggestive of a hereditary cancer syndrome. We, therefore, discussed and recommended the following at today's visit.   DISCUSSION: We discussed that 5 - 10% of cancer is hereditary.  Most cases of ovarian cancers are associated with mutations in BRCA1/2.  There are other genes that can be associated with hereditary ovarian cancer syndromes.  We discussed that testing is beneficial for several reasons including knowing how to follow individuals for their cancer risks, identifying whether potential treatment options, such as PARP inhibitors, would be beneficial, and understanding if other family members  could be at risk for cancer and allowing them to undergo genetic testing.   We reviewed the characteristics, features and inheritance patterns of hereditary cancer syndromes. We also discussed genetic testing, including the appropriate family members to test, the process of testing, insurance coverage and turn-around-time for results. We discussed the implications of a negative, positive, carrier and/or variant of uncertain significant result. We recommended [name] pursue genetic testing for a panel that includes genes associated with breast,  ovarian, colon, and other cancers.   The Multi-Cancer + RNA Panel offered by [facility] includes sequencing and/or deletion/duplication analysis of the following 70 genes:  AIP*, ALK, APC*, ATM*, AXIN2*, BAP1*, BARD1*, BLM*, BMPR1A*, BRCA1*, BRCA2*, BRIP1*, CDC73*, CDH1*, CDK4, CDKN1B*, CDKN2A, CHEK2*, CTNNA1*, DICER1*, EPCAM (del/dup only), EGFR, FH*, FLCN*, GREM1 (promoter dup only), HOXB13, KIT, LZTR1, MAX*, MBD4, MEN1*, MET, MITF, MLH1*, MSH2*, MSH3*, MSH6*, MUTYH*, NF1*, NF2*, NTHL1*, PALB2*, PDGFRA, PMS2*, POLD1*, POLE*, POT1*, PRKAR1A*, PTCH1*, PTEN*, RAD51C*, RAD51D*, RB1*, RET, SDHA* (sequencing only), SDHAF2*, SDHB*, SDHC*, SDHD*, SMAD4*, SMARCA4*, SMARCB1*, SMARCE1*, STK11*, SUFU*, TMEM127*, TP53*, TSC1*, TSC2*, VHL*. RNA analysis is performed for * genes.  Based on [name] personal history of ovarian cancer, she meets medical criteria for genetic testing. Despite that she meets criteria, she may still have an out of pocket cost. We discussed that if her out of pocket cost for testing is over $100, the laboratory should contact her and discuss the self-pay prices and/or patient pay assistance programs.    PLAN: After considering the risks, benefits, and limitations, [name] provided informed consent to pursue genetic testing and.  Blood sample will be collected during her port flush/lab appt on 5/31 and sent to [facility] for analysis of the Multi-Cancer +RNA Panel. Results should be available within approximately 3 weeks' time, at which point they will be disclosed by telephone to [name], as will any additional recommendations warranted by these results. [name] will receive a summary of her genetic counseling visit and a copy of her results once available. This information will also be available in Epic.   [name] questions were answered to her satisfaction today. Our contact information was provided should additional questions or concerns arise. Thank you for the  referral and allowing [place] to share in the care of your patient.   Dominyk Law M. Rennie Plowman, MS, [facility] Genetic Counselor [email or web address] (P) [phone]  The patient was seen for a total of 30 minutes in face-to-face genetic counseling.  The patient was seen alone.  Drs. Pamelia Love and/or Mosetta Putt were available to discuss this case as needed.    _______________________________________________________________________ For Office Staff:  Number of people involved in session: 1 Was an Intern/ student involved with case: no

## 2022-10-05 MED FILL — Fosaprepitant Dimeglumine For IV Infusion 150 MG (Base Eq): INTRAVENOUS | Qty: 5 | Status: AC

## 2022-10-05 MED FILL — Dexamethasone Sodium Phosphate Inj 100 MG/10ML: INTRAMUSCULAR | Qty: 1 | Status: AC

## 2022-10-06 ENCOUNTER — Inpatient Hospital Stay (HOSPITAL_BASED_OUTPATIENT_CLINIC_OR_DEPARTMENT_OTHER): Payer: 59 | Admitting: Hematology and Oncology

## 2022-10-06 ENCOUNTER — Inpatient Hospital Stay: Payer: 59

## 2022-10-06 ENCOUNTER — Encounter: Payer: Self-pay | Admitting: Hematology and Oncology

## 2022-10-06 VITALS — BP 161/90 | HR 68 | Resp 16

## 2022-10-06 VITALS — BP 152/91 | HR 82 | Temp 97.9°F | Resp 19 | Wt 176.2 lb

## 2022-10-06 DIAGNOSIS — Z5111 Encounter for antineoplastic chemotherapy: Secondary | ICD-10-CM | POA: Diagnosis not present

## 2022-10-06 DIAGNOSIS — C563 Malignant neoplasm of bilateral ovaries: Secondary | ICD-10-CM | POA: Diagnosis not present

## 2022-10-06 DIAGNOSIS — G620 Drug-induced polyneuropathy: Secondary | ICD-10-CM | POA: Diagnosis not present

## 2022-10-06 DIAGNOSIS — D701 Agranulocytosis secondary to cancer chemotherapy: Secondary | ICD-10-CM

## 2022-10-06 DIAGNOSIS — T451X5A Adverse effect of antineoplastic and immunosuppressive drugs, initial encounter: Secondary | ICD-10-CM | POA: Diagnosis not present

## 2022-10-06 DIAGNOSIS — C786 Secondary malignant neoplasm of retroperitoneum and peritoneum: Secondary | ICD-10-CM | POA: Diagnosis not present

## 2022-10-06 DIAGNOSIS — Z803 Family history of malignant neoplasm of breast: Secondary | ICD-10-CM | POA: Diagnosis not present

## 2022-10-06 LAB — CBC WITH DIFFERENTIAL (CANCER CENTER ONLY)
Abs Immature Granulocytes: 0.02 10*3/uL (ref 0.00–0.07)
Basophils Absolute: 0 10*3/uL (ref 0.0–0.1)
Basophils Relative: 0 %
Eosinophils Absolute: 0 10*3/uL (ref 0.0–0.5)
Eosinophils Relative: 0 %
HCT: 36.5 % (ref 36.0–46.0)
Hemoglobin: 12.5 g/dL (ref 12.0–15.0)
Immature Granulocytes: 1 %
Lymphocytes Relative: 16 %
Lymphs Abs: 0.4 10*3/uL — ABNORMAL LOW (ref 0.7–4.0)
MCH: 33.9 pg (ref 26.0–34.0)
MCHC: 34.2 g/dL (ref 30.0–36.0)
MCV: 98.9 fL (ref 80.0–100.0)
Monocytes Absolute: 0.1 10*3/uL (ref 0.1–1.0)
Monocytes Relative: 2 %
Neutro Abs: 2.1 10*3/uL (ref 1.7–7.7)
Neutrophils Relative %: 81 %
Platelet Count: 221 10*3/uL (ref 150–400)
RBC: 3.69 MIL/uL — ABNORMAL LOW (ref 3.87–5.11)
RDW: 13 % (ref 11.5–15.5)
WBC Count: 2.6 10*3/uL — ABNORMAL LOW (ref 4.0–10.5)
nRBC: 0 % (ref 0.0–0.2)

## 2022-10-06 LAB — CMP (CANCER CENTER ONLY)
ALT: 22 U/L (ref 0–44)
AST: 17 U/L (ref 15–41)
Albumin: 4.2 g/dL (ref 3.5–5.0)
Alkaline Phosphatase: 101 U/L (ref 38–126)
Anion gap: 7 (ref 5–15)
BUN: 19 mg/dL (ref 8–23)
CO2: 26 mmol/L (ref 22–32)
Calcium: 9.5 mg/dL (ref 8.9–10.3)
Chloride: 106 mmol/L (ref 98–111)
Creatinine: 0.79 mg/dL (ref 0.44–1.00)
GFR, Estimated: >60 mL/min (ref >60)
Glucose, Bld: 164 mg/dL — ABNORMAL HIGH (ref 70–99)
Potassium: 4.2 mmol/L (ref 3.5–5.1)
Sodium: 139 mmol/L (ref 135–145)
Total Bilirubin: 0.3 mg/dL (ref 0.3–1.2)
Total Protein: 7.1 g/dL (ref 6.5–8.1)

## 2022-10-06 MED ORDER — SODIUM CHLORIDE 0.9 % IV SOLN
150.0000 mg | Freq: Once | INTRAVENOUS | Status: AC
  Administered 2022-10-06: 150 mg via INTRAVENOUS
  Filled 2022-10-06: qty 150

## 2022-10-06 MED ORDER — SODIUM CHLORIDE 0.9 % IV SOLN: Freq: Once | INTRAVENOUS | Status: AC

## 2022-10-06 MED ORDER — SODIUM CHLORIDE 0.9 % IV SOLN
681.0000 mg | Freq: Once | INTRAVENOUS | Status: AC
  Administered 2022-10-06: 680 mg via INTRAVENOUS
  Filled 2022-10-06: qty 68

## 2022-10-06 MED ORDER — SODIUM CHLORIDE 0.9% FLUSH
10.0000 mL | INTRAVENOUS | Status: DC | PRN
  Administered 2022-10-06: 10 mL

## 2022-10-06 MED ORDER — PALONOSETRON HCL INJECTION 0.25 MG/5ML
0.2500 mg | Freq: Once | INTRAVENOUS | Status: AC
  Administered 2022-10-06: 0.25 mg via INTRAVENOUS
  Filled 2022-10-06: qty 5

## 2022-10-06 MED ORDER — SODIUM CHLORIDE 0.9 % IV SOLN
10.0000 mg | Freq: Once | INTRAVENOUS | Status: AC
  Administered 2022-10-06: 10 mg via INTRAVENOUS
  Filled 2022-10-06: qty 10

## 2022-10-06 MED ORDER — FAMOTIDINE IN NACL 20-0.9 MG/50ML-% IV SOLN
20.0000 mg | Freq: Once | INTRAVENOUS | Status: AC
  Administered 2022-10-06: 20 mg via INTRAVENOUS
  Filled 2022-10-06: qty 50

## 2022-10-06 MED ORDER — SODIUM CHLORIDE 0.9 % IV SOLN
131.2500 mg/m2 | Freq: Once | INTRAVENOUS | Status: AC
  Administered 2022-10-06: 258 mg via INTRAVENOUS
  Filled 2022-10-06: qty 43

## 2022-10-06 MED ORDER — HEPARIN SOD (PORK) LOCK FLUSH 100 UNIT/ML IV SOLN
500.0000 [IU] | Freq: Once | INTRAVENOUS | Status: AC | PRN
  Administered 2022-10-06: 500 [IU]

## 2022-10-06 MED ORDER — CETIRIZINE HCL 10 MG/ML IV SOLN
10.0000 mg | Freq: Once | INTRAVENOUS | Status: AC
  Administered 2022-10-06: 10 mg via INTRAVENOUS
  Filled 2022-10-06: qty 1

## 2022-10-06 NOTE — Assessment & Plan Note (Signed)
This is likely due to recent treatment. The patient denies recent history of fevers, cough, chills, diarrhea or dysuria. She is asymptomatic from the leukopenia. I will observe for now.    

## 2022-10-06 NOTE — Assessment & Plan Note (Signed)
She tolerated treatment well except for unexplained dizziness and brief episode of transient neuropathy that has since improved back to baseline We will proceed with treatment without delay

## 2022-10-06 NOTE — Patient Instructions (Signed)
[facility]  Discharge Instructions: Thank you for choosing [facility] to provide your oncology and hematology care.   If you have a lab appointment with the Cancer Center, please go directly to the Cancer Center and check in at the registration area.   Wear comfortable clothing and clothing appropriate for easy access to any Portacath or PICC line.   We strive to give you quality time with your provider. You may need to reschedule your appointment if you arrive late (15 or more minutes).  Arriving late affects you and other patients whose appointments are after yours.  Also, if you miss three or more appointments without notifying the office, you may be dismissed from the clinic at the provider's discretion.      For prescription refill requests, have your pharmacy contact our office and allow 72 hours for refills to be completed.    Today you received the following chemotherapy and/or immunotherapy agents: Paclitaxel, Carboplatin.       To help prevent nausea and vomiting after your treatment, we encourage you to take your nausea medication as directed.  BELOW ARE SYMPTOMS THAT SHOULD BE REPORTED IMMEDIATELY: *FEVER GREATER THAN 100.4 F (38 C) OR HIGHER *CHILLS OR SWEATING *NAUSEA AND VOMITING THAT IS NOT CONTROLLED WITH YOUR NAUSEA MEDICATION *UNUSUAL SHORTNESS OF BREATH *UNUSUAL BRUISING OR BLEEDING *URINARY PROBLEMS (pain or burning when urinating, or frequent urination) *BOWEL PROBLEMS (unusual diarrhea, constipation, pain near the anus) TENDERNESS IN MOUTH AND THROAT WITH OR WITHOUT PRESENCE OF ULCERS (sore throat, sores in mouth, or a toothache) UNUSUAL RASH, SWELLING OR PAIN  UNUSUAL VAGINAL DISCHARGE OR ITCHING   Items with * indicate a potential emergency and should be followed up as soon as possible or go to the Emergency Department if any problems should occur.  Please show the CHEMOTHERAPY ALERT CARD or IMMUNOTHERAPY  ALERT CARD at check-in to the Emergency Department and triage nurse.  Should you have questions after your visit or need to cancel or reschedule your appointment, please contact [facility]  Dept: [phone]  and follow the prompts.  Office hours are 8:00 a.m. to 4:30 p.m. Monday - Friday. Please note that voicemails left after 4:00 p.m. may not be returned until the following business day.  We are closed weekends and major holidays. You have access to a nurse at all times for urgent questions. Please call the main number to the clinic Dept: [phone] and follow the prompts.   For any non-urgent questions, you may also contact your provider using MyChart. We now offer e-Visits for anyone 18 and older to request care online for non-urgent symptoms. For details visit [email or web address].   Also download the MyChart app! Go to the app store, search "MyChart", open the app, select [facility], and log in with your MyChart username and password.   

## 2022-10-06 NOTE — Assessment & Plan Note (Signed)
This is stable ?Observe closely ?

## 2022-10-06 NOTE — Progress Notes (Signed)
Per Bertis Ruddy, MD okay to release Premeds pending lab results.

## 2022-10-06 NOTE — Progress Notes (Signed)
[facility] OFFICE PROGRESS NOTE  Patient Care Team: Johny Blamer, MD as PCP - General (Family Medicine) Noel Christmas (Physician Assistant)  ASSESSMENT & PLAN:  Bilateral primary ovarian cancer [facility]) She tolerated treatment well except for unexplained dizziness and brief episode of transient neuropathy that has since improved back to baseline We will proceed with treatment without delay  Leukopenia due to antineoplastic chemotherapy [facility]) This is likely due to recent treatment. The patient denies recent history of fevers, cough, chills, diarrhea or dysuria. She is asymptomatic from the leukopenia. I will observe for now.    Peripheral neuropathy due to chemotherapy [facility]) This is stable Observe closely  No orders of the defined types were placed in this encounter.   All questions were answered. The patient knows to call the clinic with any problems, questions or concerns. The total time spent in the appointment was 20 minutes encounter with patients including review of chart and various tests results, discussions about plan of care and coordination of care plan   Artis Delay, MD 10/06/2022 8:58 AM  INTERVAL HISTORY: Please see below for problem oriented charting. she returns for treatment follow-up seen prior to chemotherapy She had a trip to the ER recently due to transient dizziness that has since very improved She also have slight neuropathy that was worse but now back to baseline She had transient constipation that has since resolved  REVIEW OF SYSTEMS:   Constitutional: Denies fevers, chills or abnormal weight loss Eyes: Denies blurriness of vision Ears, nose, mouth, throat, and face: Denies mucositis or sore throat Respiratory: Denies cough, dyspnea or wheezes Cardiovascular: Denies palpitation, chest discomfort or lower extremity swelling Gastrointestinal:  Denies nausea, heartburn or change in bowel habits Skin: Denies abnormal skin  rashes Lymphatics: Denies new lymphadenopathy or easy bruising  Behavioral/Psych: Mood is stable, no new changes  All other systems were reviewed with the patient and are negative.  I have reviewed the past medical history, past surgical history, social history and family history with the patient and they are unchanged from previous note.  ALLERGIES:  is allergic to cymbalta [duloxetine hcl], cyclobenzaprine hcl, celexa  [citalopram hydrobromide], fluticasone furoate-vilanterol, gabapentin, opana  [oxymorphone hcl], and tramadol.  MEDICATIONS:  Current Outpatient Medications  Medication Sig Dispense Refill   albuterol (VENTOLIN HFA) 108 (90 Base) MCG/ACT inhaler Inhale 2 puffs into the lungs every 6 (six) hours as needed for shortness of breath.     amphetamine-dextroamphetamine (ADDERALL) 20 MG tablet Take 10-20 mg by mouth See admin instructions. Take 20 mg by mouth in the morning and 10 mg between 1-2 PM daily     apixaban (ELIQUIS) 2.5 MG TABS tablet Take 1 tablet (2.5 mg total) by mouth 2 (two) times daily. 60 tablet 11   Ascorbic Acid (VITAMIN C) 1000 MG tablet Take 1,000 mg by mouth daily.     ASHWAGANDHA PO Take 1 capsule by mouth daily.     BLACK CURRANT SEED OIL PO Take 1 capsule by mouth daily.     dexamethasone (DECADRON) 4 MG tablet Take 2 tabs at the night before and 2 tab the morning of chemotherapy, every 3 weeks, by mouth x 6 cycles 24 tablet 6   diclofenac Sodium (VOLTAREN) 1 % GEL Apply 2 g topically 2 (two) times daily.     docusate sodium (COLACE) 100 MG capsule Take 1 capsule (100 mg total) by mouth 2 (two) times daily. Do not take if having diarrhea 30 capsule 0   lidocaine-prilocaine (  EMLA) cream Apply to affected area once (Patient taking differently: Apply 1 Application topically daily as needed (port access). Apply to affected area once) 30 g 3   LORazepam (ATIVAN) 0.5 MG tablet Take 0.5 mg by mouth 3 (three) times daily as needed for anxiety.     meclizine  (ANTIVERT) 12.5 MG tablet Take 12.5 mg by mouth 3 (three) times daily as needed for dizziness.     Melatonin 10 MG TABS Take 10 mg by mouth at bedtime as needed (sleep).     Multiple Vitamin (MULTIVITAMIN WITH MINERALS) TABS tablet Take 1 tablet by mouth daily.     mupirocin ointment (BACTROBAN) 2 % Apply 1 Application topically 3 (three) times daily as needed (HS flares).     naloxone (NARCAN) nasal spray 4 mg/0.1 mL Place 1 spray into the nose as needed (opioid overdose).     ondansetron (ZOFRAN) 8 MG tablet Take 8 mg by mouth every 8 (eight) hours as needed for nausea or vomiting.     OVER THE COUNTER MEDICATION Take 2 capsules by mouth daily. Sea Moss advanced supplement     oxyCODONE ER (XTAMPZA ER) 13.5 MG C12A Take 13.5 mg by mouth in the morning, at noon, and at bedtime.     Oxycodone HCl 20 MG TABS Take 20 mg by mouth 5 (five) times daily as needed (pain).     polyethylene glycol (MIRALAX / GLYCOLAX) 17 g packet Take 17 g by mouth daily.     prochlorperazine (COMPAZINE) 10 MG tablet Take 10 mg by mouth every 6 (six) hours as needed for nausea or vomiting.     Vitamin D, Ergocalciferol, (DRISDOL) 1.25 MG (50000 UNIT) CAPS capsule Take 50,000 Units by mouth every Sunday.     WIXELA INHUB 250-50 MCG/ACT AEPB Inhale 1 puff into the lungs daily.     No current facility-administered medications for this visit.    SUMMARY OF ONCOLOGIC HISTORY: Oncology History Overview Note  High grade serous   Bilateral primary ovarian cancer (HCC)  04/11/2022 Imaging   CT of the abdomen and pelvis on 04/11/2022 reveals a left adnexal masslike area measuring 5.9 x 3.6 cm.  Margins of this mass are irregular.  There is small fluid in the cul-de-sac as well as fullness of soft tissue about the right adnexa.  Discrete uterine structure is not identified.  Left upper quadrant nodules beneath the left hemidiaphragm (1 anterior to the spleen and the other to the stomach).  Omental/peritoneal nodularity and multiple  perihepatic nodules are noted    04/18/2022 Tumor Marker   Patient's tumor was tested for the following markers: CA-125. Results of the tumor marker test revealed 123.   04/19/2022 Initial Diagnosis   Carcinomatosis (HCC)   04/28/2022 Imaging   1. Bulky bilateral ovarian masses and nodules, left-greater-than-right, which are essentially confluent with adjacent pelvic soft tissue nodularity and not significantly changed compared to prior examination dated 04/11/2022. 2. Extensive pelvic peritoneal thickening and nodularity. Multiple small peritoneal nodules throughout the abdomen and pelvis. Findings are consistent with peritoneal metastatic disease and likewise not significantly changed. 3. Small volume of loculated appearing fluid in the low pelvis. 4. 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 The mid to distal right ureter is obstructed by right ovarian mass and or soft tissue nodularity. 5. Calculus within the most distal left ureterovesicular junction or just within the bladder lumen measuring 0.7 cm, unchanged. No associated left-sided hydronephrosis.  6. Soft tissue attenuation nodule of the body of the right adrenal gland. Notably, this was also soft tissue attenuation on prior noncontrast examination (i.e. not definitively macroscopic fat containing) although unchanged compared to prior examinations dating back to 2018 and almost certainly a benign adenoma. Attention on follow-up. 7. No evidence of metastatic disease in the chest. 8. Mild diffuse bilateral bronchial wall thickening. Background of very fine centrilobular nodularity, most concentrated in the lung apices. Findings are most consistent with smoking-related respiratory bronchiolitis.     05/18/2022 Procedure   Ultrasound-guided core biopsy performed of a 10 mm soft tissue peritoneal mass in the right upper quadrant just  superficial to the right lobe of the liver. The procedure was performed under general anesthesia immediately following Port-A-Cath placement.   05/18/2022 Procedure   Placement of single lumen port a cath via right internal jugular vein. The catheter tip lies at the cavo-atrial junction. A power injectable port a cath was placed and is ready for immediate use.     05/18/2022 Pathology Results   A. 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  The findings support the above interpretation.  This case was reviewed with Dr. Kenard Gower who agrees with the above diagnosis.  A p16 stain is pending and will be reported in an addendum.    05/23/2022 Cancer Staging   Staging form: Ovary, Fallopian Tube, and Primary Peritoneal Carcinoma, AJCC 8th Edition - Clinical stage from 05/23/2022: FIGO Stage IIIC (cT3c, cN0, cM0) - Signed by Artis Delay, MD on 05/23/2022 Stage prefix: Initial diagnosis   05/29/2022 Imaging   1. Complex bilateral adnexal masses, as described above, consistent with the patient's known malignancy. 2. Subsequent marked severity mass effect on the distal bilateral common iliac veins, increased in severity when compared to the prior exam. 3. Additional findings consistent with peritoneal metastasis within the pelvis. 4. Findings consistent with pelvic congestion syndrome. 5. Postoperative changes within the mid and lower lumbar spine. 6. Aortic atherosclerosis. 7. No evidence of venous thrombus within the abdomen, pelvis or proximal bilateral lower extremities.   Aortic Atherosclerosis (ICD10-I70.0).   06/02/2022 -  Chemotherapy   Patient is on Treatment Plan : OVARIAN Carboplatin (AUC 6) + Paclitaxel (175) q21d X 6 Cycles     06/26/2022 Tumor Marker   Patient's tumor was tested for the following markers: CA-125. Results of  the tumor marker test revealed 134.   08/04/2022 Imaging   CT ABDOMEN PELVIS W CONTRAST  Result Date: 08/04/2022 CLINICAL DATA:  Ovarian cancer, chemotherapy in progress, for restaging EXAM: CT ABDOMEN AND PELVIS WITH CONTRAST TECHNIQUE: Multidetector CT imaging of the abdomen and pelvis was performed using the standard protocol following bolus administration of intravenous contrast. RADIATION DOSE REDUCTION: This exam was performed according to the departmental dose-optimization program which includes automated exposure control, adjustment of the mA and/or kV according to patient size and/or use of iterative reconstruction technique. CONTRAST:  OMNIPAQUE IOHEXOL 300 MG/ML  SOLN COMPARISON:  CT venogram abdomen/pelvis dated 05/29/2022. CT abdomen/pelvis dated 04/26/2022. FINDINGS: Lower chest: Lung bases are clear. Hepatobiliary: Liver is within normal limits. Gallbladder is unremarkable. No intrahepatic or extrahepatic ductal dilatation. Pancreas: Within normal limits. Spleen: Within normal limits. Adrenals/Urinary Tract: 2.4 cm right adrenal nodule (series 2/image 17), previously 2.0 cm in 2018, likely reflecting a benign adrenal adenoma. Left adrenal gland is within normal limits. Left  kidney is within normal limits. 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 Associated extrinsic compression at the level of the right adnexal mass (described below). Mildly thick-walled bladder, although underdistended. Stable 6 mm left UVJ calcification (series 2/image 68). Stomach/Bowel: Stomach is within normal limits. No evidence of bowel obstruction. Normal appendix (series 2/image 47). Left adnexal mass (described below) is favored to abut and directly invade the left lateral wall of the sigmoid colon (series 2/image 57). Vascular/Lymphatic: No evidence of abdominal aortic aneurysm. Atherosclerotic calcifications of the  abdominal aorta and branch vessels. No suspicious abdominopelvic lymphadenopathy. Reproductive: Heterogeneous uterus with associated surface nodularity (series 2/image 65), likely reflecting peritoneal disease. This appearance is improved from the prior. 3.9 x 3.3 cm left adnexal mass (series 2/image 60), previously 5.3 x 3.9 cm, with extension along the sigmoid mesocolon (series 2/image 56) and suspected involvement of the sigmoid colon (series 2/image 57). 5.6 x 2.7 cm mixed cystic/solid right adnexal mass (series 2/image 89), previously 6.9 x 4.3 cm when measured in a similar fashion. Other: No abdominopelvic ascites. Scattered minimal peritoneal nodularity beneath the anterior abdominal wall, including a 5 mm nodule beneath the left anterior abdominal wall (series 2/image 27) which previously measured 12 mm. Additional mild nodularity on series 2/images 32, 33, and 50. Musculoskeletal: Status post PLIF at L4-S1. Status post lateral fixation at L4-5. Status post anterior fixation at L5-S1. Mild degenerative changes of the lower thoracic spine. IMPRESSION: Improving bilateral adnexa masses in this patient with known ovarian cancer, as above. Suspected involvement of the sigmoid colon. No evidence of bowel obstruction. Improving peritoneal nodularity, as above. Additional ancillary findings as above. Electronically Signed   By: Charline Bills M.D.   On: 08/04/2022 01:27      08/16/2022 Pathology Results   CASE: [id number] PATIENT: Donnamae Jude Surgical Pathology Report  Clinical History: Advanced gyn malignancy (crm)  FINAL MICROSCOPIC DIAGNOSIS:  A. OMENTUM, RESECTION: - Positive for carcinoma  B. LIVER ADHESION, EXCISION: - Benign fibroadipose tissue - Negative for carcinoma  C. SMALL BOWEL MESENTERIC IMPLANT, EXCISION: - Positive for carcinoma with extensive necrosis  D. UTERUS, CERVIX, BILATERAL FALLOPIAN TUBES AND OVARIES, AND APPENDIX, RESECTION: - Uterine serosa: Tumor deposits  present - Cervix: Benign, nabothian cyst - Endometrium: Benign inactive endometrium, benign endometrial polyp - Myometrium: Leiomyoma - Right fallopian tube: Metastatic tumor deposits present - Right ovary: High-grade serous carcinoma - Left ovary: High-grade serous carcinoma - Benign appendix - See oncology table  E. COLON, RECTOSIGMOID, RESECTION: - Benign colonic mucosa - Margins free of carcinoma - Adherent fallopian tube with metastatic tumor deposits - 2 benign lymph nodes  F. RECTAL TUMOR IMPLANT, EXCISION: - Metastatic high-grade serous carcinoma   G. COLON, RECTOSIGMOID ANASTOMOTIC RINGS: - Benign anastomotic donuts  ONCOLOGY TABLE:   OVARY or FALLOPIAN TUBE or PRIMARY PERITONEUM: Resection  Procedure: Total abdominal hysterectomy, bilateral salpingo-oophorectomy, appendectomy, rectosigmoid resection, total omentectomy, small bowel nodule resection Specimen Integrity: Intact Tumor Site: Bilateral Tumor Size: Right ovary: 3.9 cm, left ovary: Approximately 1.2 cm Histologic Type: High-grade serous carcinoma Histologic Grade: High-grade Ovarian Surface Involvement: Present Fallopian Tube Surface Involvement: Present Implants: Present, small bowel mesenteric and rectovaginal septum Lymphatic and/or Vascular Invasion: Not identified Other Tissue/ Organ Involvement: Fallopian tube, bilateral Largest Extrapelvic Peritoneal Focus: 1.4 cm, small bowel mesentery Peritoneal/Ascitic Fluid Involvement: Not applicable Chemotherapy Response Score (CRS): Not applicable, no known presurgical therapy Regional Lymph Nodes: Not applicable (no lymph nodes submitted or found) Distant Metastasis:  Distant Site(s) Involved: Not applicable Pathologic Stage Classification (pTNM, AJCC 8th Edition): pT3b, pN[not assigned] Ancillary Studies: Can be performed upon request Representative Tumor Block: D8 Comment(s): Tumor is staged as pT3 given the presence of extrapelvic peritoneal  deposits on small bowel mesentery less than 2cm (1.4 cm) ([id number])    09/18/2022 Tumor Marker   Patient's tumor was tested for the following markers: CA-125. Results of the tumor marker test revealed 36.8.     PHYSICAL EXAMINATION: ECOG PERFORMANCE STATUS: 1 - Symptomatic but completely ambulatory  Vitals:   10/06/22 0843  BP: (!) 152/91  Pulse: 82  Resp: 19  Temp: 97.9 F (36.6 C)  SpO2: 100%   Filed Weights   10/06/22 0843  Weight: 176 lb 3.2 oz (79.9 kg)    GENERAL:alert, no distress and comfortable  NEURO: alert & oriented x 3 with fluent speech, no focal motor/sensory deficits  LABORATORY DATA:  I have reviewed the data as listed    Component Value Date/Time   NA 136 09/19/2022 1012   K 3.8 09/19/2022 1012   CL 101 09/19/2022 1012   CO2 27 09/19/2022 1012   GLUCOSE 101 (H) 09/19/2022 1012   BUN 19 09/19/2022 1012   CREATININE 0.71 09/19/2022 1012   CREATININE 0.78 09/15/2022 0844   CALCIUM 9.3 09/19/2022 1012   PROT 7.2 09/19/2022 1012   ALBUMIN 4.0 09/19/2022 1012   AST 21 09/19/2022 1012   AST 17 09/15/2022 0844   ALT 26 09/19/2022 1012   ALT 21 09/15/2022 0844   ALKPHOS 71 09/19/2022 1012   BILITOT 0.9 09/19/2022 1012   BILITOT 0.4 09/15/2022 0844   GFRNONAA >60 09/19/2022 1012   GFRNONAA >60 09/15/2022 0844   GFRAA >60 08/20/2019 1408    No results found for: "SPEP", "UPEP"  Lab Results  Component Value Date   WBC 2.6 (L) 10/06/2022   NEUTROABS 2.1 10/06/2022   HGB 12.5 10/06/2022   HCT 36.5 10/06/2022   MCV 98.9 10/06/2022   PLT 221 10/06/2022      Chemistry      Component Value Date/Time   NA 136 09/19/2022 1012   K 3.8 09/19/2022 1012   CL 101 09/19/2022 1012   CO2 27 09/19/2022 1012   BUN 19 09/19/2022 1012   CREATININE 0.71 09/19/2022 1012   CREATININE 0.78 09/15/2022 0844      Component Value Date/Time   CALCIUM 9.3 09/19/2022 1012   ALKPHOS 71 09/19/2022 1012   AST 21 09/19/2022 1012   AST 17 09/15/2022 0844   ALT  26 09/19/2022 1012   ALT 21 09/15/2022 0844   BILITOT 0.9 09/19/2022 1012   BILITOT 0.4 09/15/2022 0844       RADIOGRAPHIC STUDIES: I have personally reviewed the radiological images as listed and agreed with the findings in the report. CT Head Wo Contrast  Result Date: 09/19/2022 CLINICAL DATA:  Headache EXAM: CT HEAD WITHOUT CONTRAST TECHNIQUE: Contiguous axial images were obtained from the base of the skull through the vertex without intravenous contrast. RADIATION DOSE REDUCTION: This exam was performed according to the departmental dose-optimization program which includes automated exposure control, adjustment of the mA and/or kV according to patient size and/or use of iterative reconstruction technique. COMPARISON:  CT Head 08/12/14, MR Head 10/18/21 FINDINGS: Brain: No evidence of acute infarction, hemorrhage, hydrocephalus, extra-axial collection or mass lesion/mass effect. There are subcortical hypodensities, which are nonspecific and could represent changes related to patient's history demyelinating disease or chronic microvascular ischemic change. Vascular: No hyperdense vessel  or unexpected calcification. Skull: Normal. Negative for fracture or focal lesion. Sinuses/Orbits: No middle ear or mastoid effusion. Paranasal sinuses are clear. Orbits are unremarkable. Other: None. IMPRESSION: 1. No acute intracranial abnormality. 2. Multiple subcortical hypodensities are nonspecific and could represent changes related to patient's history demyelinating disease or chronic microvascular ischemic change. Electronically Signed   By: Lorenza Cambridge M.D.   On: 09/19/2022 11:08   DG Chest 2 View  Result Date: 09/19/2022 CLINICAL DATA:  Chest pain EXAM: CHEST - 2 VIEW COMPARISON:  04/27/2021 FINDINGS: Right-sided chest port in position with distal tip terminating at the superior cavoatrial junction. Heart size is normal. No focal airspace consolidation, pleural effusion, or pneumothorax. No acute osseous  abnormality. IMPRESSION: No active cardiopulmonary disease. Electronically Signed   By: Duanne Guess D.O.   On: 09/19/2022 10:39

## 2022-10-11 DIAGNOSIS — J4520 Mild intermittent asthma, uncomplicated: Secondary | ICD-10-CM | POA: Diagnosis not present

## 2022-10-11 DIAGNOSIS — C762 Malignant neoplasm of abdomen: Secondary | ICD-10-CM | POA: Diagnosis not present

## 2022-10-11 DIAGNOSIS — G35 Multiple sclerosis: Secondary | ICD-10-CM | POA: Diagnosis not present

## 2022-10-11 DIAGNOSIS — G620 Drug-induced polyneuropathy: Secondary | ICD-10-CM | POA: Diagnosis not present

## 2022-10-11 DIAGNOSIS — M069 Rheumatoid arthritis, unspecified: Secondary | ICD-10-CM | POA: Diagnosis not present

## 2022-10-12 ENCOUNTER — Other Ambulatory Visit: Payer: Self-pay | Admitting: Hematology and Oncology

## 2022-10-16 ENCOUNTER — Telehealth: Payer: Self-pay | Admitting: Genetic Counselor

## 2022-10-16 ENCOUNTER — Ambulatory Visit: Payer: Self-pay | Admitting: Genetic Counselor

## 2022-10-16 ENCOUNTER — Encounter: Payer: Self-pay | Admitting: Physician Assistant

## 2022-10-16 ENCOUNTER — Encounter: Payer: Self-pay | Admitting: Genetic Counselor

## 2022-10-16 DIAGNOSIS — Z1379 Encounter for other screening for genetic and chromosomal anomalies: Secondary | ICD-10-CM | POA: Insufficient documentation

## 2022-10-16 DIAGNOSIS — C563 Malignant neoplasm of bilateral ovaries: Secondary | ICD-10-CM

## 2022-10-16 DIAGNOSIS — Z803 Family history of malignant neoplasm of breast: Secondary | ICD-10-CM

## 2022-10-16 NOTE — Telephone Encounter (Signed)
Disclosed negative genetics and VUS in POLD[facility].

## 2022-10-17 ENCOUNTER — Other Ambulatory Visit: Payer: Self-pay | Admitting: Physician Assistant

## 2022-10-17 DIAGNOSIS — G35 Multiple sclerosis: Secondary | ICD-10-CM

## 2022-10-18 ENCOUNTER — Encounter: Payer: Self-pay | Admitting: Oncology

## 2022-10-18 NOTE — Progress Notes (Signed)
Requested [employer] testing since genetic testing was negative on accession [id number] with [facility] via email.

## 2022-10-26 ENCOUNTER — Other Ambulatory Visit: Payer: 59

## 2022-10-26 ENCOUNTER — Ambulatory Visit
Admission: RE | Admit: 2022-10-26 | Discharge: 2022-10-26 | Disposition: A | Discharge status: Home / Self Care | Payer: 59 | Source: Ambulatory Visit | Attending: Physician Assistant | Admitting: Physician Assistant

## 2022-10-26 DIAGNOSIS — G35 Multiple sclerosis: Secondary | ICD-10-CM

## 2022-10-26 MED ORDER — GADOPICLENOL 0.5 MMOL/ML IV SOLN
8.0000 mL | Freq: Once | INTRAVENOUS | Status: AC | PRN
  Administered 2022-10-26: 8 mL via INTRAVENOUS

## 2022-10-31 ENCOUNTER — Encounter (HOSPITAL_COMMUNITY): Payer: Self-pay | Admitting: Gynecologic Oncology

## 2022-11-02 ENCOUNTER — Telehealth: Payer: Self-pay

## 2022-11-02 MED FILL — Dexamethasone Sodium Phosphate Inj 100 MG/10ML: INTRAMUSCULAR | Qty: 1 | Status: AC

## 2022-11-02 MED FILL — Fosaprepitant Dimeglumine For IV Infusion 150 MG (Base Eq): INTRAVENOUS | Qty: 5 | Status: AC

## 2022-11-02 NOTE — Telephone Encounter (Signed)
Called back and reviewed appts. She verbalized understanding. 

## 2022-11-03 ENCOUNTER — Inpatient Hospital Stay: Payer: 59

## 2022-11-03 ENCOUNTER — Encounter: Payer: Self-pay | Admitting: Hematology and Oncology

## 2022-11-03 ENCOUNTER — Inpatient Hospital Stay: Payer: 59 | Attending: Gynecologic Oncology | Admitting: Hematology and Oncology

## 2022-11-03 VITALS — BP 149/90 | HR 93 | Temp 97.4°F | Resp 18 | Ht 68.9 in | Wt 177.2 lb

## 2022-11-03 DIAGNOSIS — C563 Malignant neoplasm of bilateral ovaries: Secondary | ICD-10-CM | POA: Diagnosis not present

## 2022-11-03 DIAGNOSIS — Z5111 Encounter for antineoplastic chemotherapy: Secondary | ICD-10-CM | POA: Insufficient documentation

## 2022-11-03 DIAGNOSIS — G620 Drug-induced polyneuropathy: Secondary | ICD-10-CM | POA: Diagnosis not present

## 2022-11-03 DIAGNOSIS — D701 Agranulocytosis secondary to cancer chemotherapy: Secondary | ICD-10-CM | POA: Diagnosis not present

## 2022-11-03 DIAGNOSIS — Z95828 Presence of other vascular implants and grafts: Secondary | ICD-10-CM

## 2022-11-03 DIAGNOSIS — T451X5A Adverse effect of antineoplastic and immunosuppressive drugs, initial encounter: Secondary | ICD-10-CM

## 2022-11-03 LAB — CBC WITH DIFFERENTIAL (CANCER CENTER ONLY)
Abs Immature Granulocytes: 0.01 10*3/uL (ref 0.00–0.07)
Basophils Absolute: 0 10*3/uL (ref 0.0–0.1)
Basophils Relative: 0 %
Eosinophils Absolute: 0 10*3/uL (ref 0.0–0.5)
Eosinophils Relative: 0 %
HCT: 36.2 % (ref 36.0–46.0)
Hemoglobin: 12.5 g/dL (ref 12.0–15.0)
Immature Granulocytes: 0 %
Lymphocytes Relative: 17 %
Lymphs Abs: 0.5 10*3/uL — ABNORMAL LOW (ref 0.7–4.0)
MCH: 34.6 pg — ABNORMAL HIGH (ref 26.0–34.0)
MCHC: 34.5 g/dL (ref 30.0–36.0)
MCV: 100.3 fL — ABNORMAL HIGH (ref 80.0–100.0)
Monocytes Absolute: 0 10*3/uL — ABNORMAL LOW (ref 0.1–1.0)
Monocytes Relative: 2 %
Neutro Abs: 2.2 10*3/uL (ref 1.7–7.7)
Neutrophils Relative %: 81 %
Platelet Count: 269 10*3/uL (ref 150–400)
RBC: 3.61 MIL/uL — ABNORMAL LOW (ref 3.87–5.11)
RDW: 13.3 % (ref 11.5–15.5)
Smear Review: NORMAL
WBC Count: 2.8 10*3/uL — ABNORMAL LOW (ref 4.0–10.5)
nRBC: 0 % (ref 0.0–0.2)

## 2022-11-03 LAB — CMP (CANCER CENTER ONLY)
ALT: 30 U/L (ref 0–44)
AST: 29 U/L (ref 15–41)
Albumin: 4.2 g/dL (ref 3.5–5.0)
Alkaline Phosphatase: 92 U/L (ref 38–126)
Anion gap: 8 (ref 5–15)
BUN: 20 mg/dL (ref 8–23)
CO2: 26 mmol/L (ref 22–32)
Calcium: 9.7 mg/dL (ref 8.9–10.3)
Chloride: 103 mmol/L (ref 98–111)
Creatinine: 0.78 mg/dL (ref 0.44–1.00)
GFR, Estimated: >60 mL/min (ref >60)
Glucose, Bld: 151 mg/dL — ABNORMAL HIGH (ref 70–99)
Potassium: 4.1 mmol/L (ref 3.5–5.1)
Sodium: 137 mmol/L (ref 135–145)
Total Bilirubin: 0.6 mg/dL (ref 0.3–1.2)
Total Protein: 7.6 g/dL (ref 6.5–8.1)

## 2022-11-03 MED ORDER — HEPARIN SOD (PORK) LOCK FLUSH 100 UNIT/ML IV SOLN
500.0000 [IU] | Freq: Once | INTRAVENOUS | Status: AC | PRN
  Administered 2022-11-03: 500 [IU]

## 2022-11-03 MED ORDER — FAMOTIDINE IN NACL 20-0.9 MG/50ML-% IV SOLN
20.0000 mg | Freq: Once | INTRAVENOUS | Status: AC
  Administered 2022-11-03: 20 mg via INTRAVENOUS
  Filled 2022-11-03: qty 50

## 2022-11-03 MED ORDER — SODIUM CHLORIDE 0.9% FLUSH
10.0000 mL | Freq: Once | INTRAVENOUS | Status: AC
  Administered 2022-11-03: 10 mL

## 2022-11-03 MED ORDER — SODIUM CHLORIDE 0.9 % IV SOLN
131.2500 mg/m2 | Freq: Once | INTRAVENOUS | Status: AC
  Administered 2022-11-03: 258 mg via INTRAVENOUS
  Filled 2022-11-03: qty 43

## 2022-11-03 MED ORDER — SODIUM CHLORIDE 0.9 % IV SOLN
10.0000 mg | Freq: Once | INTRAVENOUS | Status: AC
  Administered 2022-11-03: 10 mg via INTRAVENOUS
  Filled 2022-11-03: qty 10

## 2022-11-03 MED ORDER — SODIUM CHLORIDE 0.9 % IV SOLN
681.0000 mg | Freq: Once | INTRAVENOUS | Status: AC
  Administered 2022-11-03: 680 mg via INTRAVENOUS
  Filled 2022-11-03: qty 68

## 2022-11-03 MED ORDER — SODIUM CHLORIDE 0.9 % IV SOLN
150.0000 mg | Freq: Once | INTRAVENOUS | Status: AC
  Administered 2022-11-03: 150 mg via INTRAVENOUS
  Filled 2022-11-03: qty 150

## 2022-11-03 MED ORDER — SODIUM CHLORIDE 0.9 % IV SOLN: Freq: Once | INTRAVENOUS | Status: AC

## 2022-11-03 MED ORDER — PALONOSETRON HCL INJECTION 0.25 MG/5ML
0.2500 mg | Freq: Once | INTRAVENOUS | Status: AC
  Administered 2022-11-03: 0.25 mg via INTRAVENOUS
  Filled 2022-11-03: qty 5

## 2022-11-03 MED ORDER — CETIRIZINE HCL 10 MG/ML IV SOLN
10.0000 mg | Freq: Once | INTRAVENOUS | Status: AC
  Administered 2022-11-03: 10 mg via INTRAVENOUS
  Filled 2022-11-03: qty 1

## 2022-11-03 MED ORDER — SODIUM CHLORIDE 0.9% FLUSH
10.0000 mL | INTRAVENOUS | Status: DC | PRN
  Administered 2022-11-03: 10 mL

## 2022-11-03 NOTE — Progress Notes (Signed)
[name] OFFICE PROGRESS NOTE  Patient Care Team: Noberto Retort, MD as PCP - General (Family Medicine) Noel Christmas (Physician Assistant)  ASSESSMENT & PLAN:  Bilateral primary ovarian cancer [facility]) She tolerated last cycle therapy well We will proceed with final treatment today Plan to order imaging study next month for further follow-up  Peripheral neuropathy due to chemotherapy [facility]) Denies worsening neuropathy She will continue her medications as directed  Leukopenia due to antineoplastic chemotherapy [facility]) This is likely due to recent treatment. The patient denies recent history of fevers, cough, chills, diarrhea or dysuria. She is asymptomatic from the leukopenia. I will observe for now.    Orders Placed This Encounter  Procedures   CT ABDOMEN PELVIS W CONTRAST    Standing Status:   Future    Standing Expiration Date:   11/03/2023    Scheduling Instructions:     No need oral contrast    Order Specific Question:   If indicated for the ordered procedure, I authorize the administration of contrast media per Radiology protocol    Answer:   Yes    Order Specific Question:   Does the patient have a contrast media/X-ray dye allergy?    Answer:   No    Order Specific Question:   Preferred imaging location?    Answer:   [facility]    Order Specific Question:   If indicated for the ordered procedure, I authorize the administration of oral contrast media per Radiology protocol    Answer:   Yes    All questions were answered. The patient knows to call the clinic with any problems, questions or concerns. The total time spent in the appointment was 20 minutes encounter with patients including review of chart and various tests results, discussions about plan of care and coordination of care plan   Artis Delay, MD 11/03/2022 1:37 PM  INTERVAL HISTORY: Please see below for problem oriented charting. she returns for treatment follow-up and seen  prior to last cycle of treatment She tolerated last cycle well Denies nausea or constipation Neuropathy is stable  REVIEW OF SYSTEMS:   Constitutional: Denies fevers, chills or abnormal weight loss Eyes: Denies blurriness of vision Ears, nose, mouth, throat, and face: Denies mucositis or sore throat Respiratory: Denies cough, dyspnea or wheezes Cardiovascular: Denies palpitation, chest discomfort or lower extremity swelling Gastrointestinal:  Denies nausea, heartburn or change in bowel habits Skin: Denies abnormal skin rashes Lymphatics: Denies new lymphadenopathy or easy bruising Neurological:Denies numbness, tingling or new weaknesses Behavioral/Psych: Mood is stable, no new changes  All other systems were reviewed with the patient and are negative.  I have reviewed the past medical history, past surgical history, social history and family history with the patient and they are unchanged from previous note.  ALLERGIES:  is allergic to cymbalta [duloxetine hcl], cyclobenzaprine hcl, celexa  [citalopram hydrobromide], fluticasone furoate-vilanterol, gabapentin, opana  [oxymorphone hcl], and tramadol.  MEDICATIONS:  Current Outpatient Medications  Medication Sig Dispense Refill   albuterol (VENTOLIN HFA) 108 (90 Base) MCG/ACT inhaler Inhale 2 puffs into the lungs every 6 (six) hours as needed for shortness of breath.     amphetamine-dextroamphetamine (ADDERALL) 20 MG tablet Take 10-20 mg by mouth See admin instructions. Take 20 mg by mouth in the morning and 10 mg between 1-2 PM daily     apixaban (ELIQUIS) 2.5 MG TABS tablet Take 1 tablet (2.5 mg total) by mouth 2 (two) times daily. 60 tablet 11  Ascorbic Acid (VITAMIN C) 1000 MG tablet Take 1,000 mg by mouth daily.     ASHWAGANDHA PO Take 1 capsule by mouth daily.     BLACK CURRANT SEED OIL PO Take 1 capsule by mouth daily.     diclofenac Sodium (VOLTAREN) 1 % GEL Apply 2 g topically 2 (two) times daily.     docusate sodium (COLACE)  100 MG capsule Take 1 capsule (100 mg total) by mouth 2 (two) times daily. Do not take if having diarrhea 30 capsule 0   lidocaine-prilocaine (EMLA) cream Apply to affected area once (Patient taking differently: Apply 1 Application topically daily as needed (port access). Apply to affected area once) 30 g 3   LORazepam (ATIVAN) 0.5 MG tablet Take 0.5 mg by mouth 3 (three) times daily as needed for anxiety.     meclizine (ANTIVERT) 12.5 MG tablet Take 12.5 mg by mouth 3 (three) times daily as needed for dizziness.     Melatonin 10 MG TABS Take 10 mg by mouth at bedtime as needed (sleep).     Multiple Vitamin (MULTIVITAMIN WITH MINERALS) TABS tablet Take 1 tablet by mouth daily.     mupirocin ointment (BACTROBAN) 2 % Apply 1 Application topically 3 (three) times daily as needed (HS flares).     naloxone (NARCAN) nasal spray 4 mg/0.1 mL Place 1 spray into the nose as needed (opioid overdose).     ondansetron (ZOFRAN) 8 MG tablet Take 8 mg by mouth every 8 (eight) hours as needed for nausea or vomiting.     OVER THE COUNTER MEDICATION Take 2 capsules by mouth daily. [place] advanced supplement     oxyCODONE ER (XTAMPZA ER) 13.5 MG C12A Take 13.5 mg by mouth in the morning, at noon, and at bedtime.     Oxycodone HCl 20 MG TABS Take 20 mg by mouth 5 (five) times daily as needed (pain).     polyethylene glycol (MIRALAX / GLYCOLAX) 17 g packet Take 17 g by mouth daily.     prochlorperazine (COMPAZINE) 10 MG tablet Take 10 mg by mouth every 6 (six) hours as needed for nausea or vomiting.     Vitamin D, Ergocalciferol, (DRISDOL) 1.25 MG (50000 UNIT) CAPS capsule Take 50,000 Units by mouth every Sunday.     WIXELA INHUB 250-50 MCG/ACT AEPB Inhale 1 puff into the lungs daily.     No current facility-administered medications for this visit.   Facility-Administered Medications Ordered in Other Visits  Medication Dose Route Frequency Provider Last Rate Last Admin   CARBOplatin (PARAPLATIN) 680 mg in sodium  chloride 0.9 % 250 mL chemo infusion  680 mg Intravenous Once Bertis Ruddy, Ruthia Person, MD       heparin lock flush 100 unit/mL  500 Units Intracatheter Once PRN Bertis Ruddy, Daliyah Sramek, MD       PACLitaxel (TAXOL) 258 mg in sodium chloride 0.9 % 250 mL chemo infusion (> 80mg /m2)  131.25 mg/m2 (Treatment Plan Recorded) Intravenous Once Bertis Ruddy, Bari Leib, MD 98 mL/hr at 11/03/22 1121 258 mg at 11/03/22 1121   sodium chloride flush (NS) 0.9 % injection 10 mL  10 mL Intracatheter PRN Artis Delay, MD        SUMMARY OF ONCOLOGIC HISTORY: Oncology History Overview Note  High grade serous, p53 mutated Neg genetics, HRD not detected, MSI stable, low TMB 2   Bilateral primary ovarian cancer (HCC)  04/11/2022 Imaging   CT of the abdomen and pelvis on 04/11/2022 reveals a left adnexal masslike area measuring 5.9 x 3.6  cm.  Margins of this mass are irregular.  There is small fluid in the cul-de-sac as well as fullness of soft tissue about the right adnexa.  Discrete uterine structure is not identified.  Left upper quadrant nodules beneath the left hemidiaphragm (1 anterior to the spleen and the other to the stomach).  Omental/peritoneal nodularity and multiple perihepatic nodules are noted    04/18/2022 Tumor Marker   Patient's tumor was tested for the following markers: CA-125. Results of the tumor marker test revealed 123.   04/19/2022 Initial Diagnosis   Carcinomatosis (HCC)   04/28/2022 Imaging   1. Bulky bilateral ovarian masses and nodules, left-greater-than-right, which are essentially confluent with adjacent pelvic soft tissue nodularity and not significantly changed compared to prior examination dated 04/11/2022. 2. Extensive pelvic peritoneal thickening and nodularity. Multiple small peritoneal nodules throughout the abdomen and pelvis. Findings are consistent with peritoneal metastatic disease and likewise not significantly changed. 3. Small volume of loculated appearing fluid in the low pelvis. 4. 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 The mid to distal right ureter is obstructed by right ovarian mass and or soft tissue nodularity. 5. Calculus within the most distal left ureterovesicular junction or just within the bladder lumen measuring 0.7 cm, unchanged. No associated left-sided hydronephrosis. 6. Soft tissue attenuation nodule of the body of the right adrenal gland. Notably, this was also soft tissue attenuation on prior noncontrast examination (i.e. not definitively macroscopic fat containing) although unchanged compared to prior examinations dating back to 2018 and almost certainly a benign adenoma. Attention on follow-up. 7. No evidence of metastatic disease in the chest. 8. Mild diffuse bilateral bronchial wall thickening. Background of very fine centrilobular nodularity, most concentrated in the lung apices. Findings are most consistent with smoking-related respiratory bronchiolitis.     05/18/2022 Procedure   Ultrasound-guided core biopsy performed of a 10 mm soft tissue peritoneal mass in the right upper quadrant just superficial to the right lobe of the liver. The procedure was performed under general anesthesia immediately following Port-A-Cath placement.   05/18/2022 Procedure   Placement of single lumen port a cath via right internal jugular vein. The catheter tip lies at the cavo-atrial junction. A power injectable port a cath was placed and is ready for immediate use.     05/18/2022 Pathology Results   A. 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  The findings support the above interpretation.  This case was reviewed with Dr. Kenard Gower who agrees with the above diagnosis.  A p16 stain is pending and will be reported in an addendum.     05/23/2022 Cancer Staging   Staging form: Ovary, Fallopian Tube, and Primary Peritoneal Carcinoma, AJCC 8th Edition - Clinical stage from 05/23/2022: FIGO Stage IIIC (cT3c, cN0, cM0) - Signed by Artis Delay, MD on 05/23/2022 Stage prefix: Initial diagnosis   05/29/2022 Imaging   1. Complex bilateral adnexal masses, as described above, consistent with the patient's known malignancy. 2. Subsequent marked severity mass effect on the distal bilateral common iliac veins, increased in severity when compared to the prior exam. 3. Additional findings consistent with peritoneal metastasis within the pelvis. 4. Findings consistent with pelvic congestion syndrome. 5. Postoperative changes within the mid and lower lumbar spine. 6. Aortic atherosclerosis. 7. No evidence of venous thrombus within  the abdomen, pelvis or proximal bilateral lower extremities.   Aortic Atherosclerosis (ICD10-I70.0).   06/02/2022 -  Chemotherapy   Patient is on Treatment Plan : OVARIAN Carboplatin (AUC 6) + Paclitaxel (175) q21d X 6 Cycles     06/26/2022 Tumor Marker   Patient's tumor was tested for the following markers: CA-125. Results of the tumor marker test revealed 134.   08/04/2022 Imaging   CT ABDOMEN PELVIS W CONTRAST  Result Date: 08/04/2022 CLINICAL DATA:  Ovarian cancer, chemotherapy in progress, for restaging EXAM: CT ABDOMEN AND PELVIS WITH CONTRAST TECHNIQUE: Multidetector CT imaging of the abdomen and pelvis was performed using the standard protocol following bolus administration of intravenous contrast. RADIATION DOSE REDUCTION: This exam was performed according to the departmental dose-optimization program which includes automated exposure control, adjustment of the mA and/or kV according to patient size and/or use of iterative reconstruction technique. CONTRAST:  OMNIPAQUE IOHEXOL 300 MG/ML  SOLN COMPARISON:  CT venogram abdomen/pelvis dated 05/29/2022. CT abdomen/pelvis dated 04/26/2022. FINDINGS: Lower  chest: Lung bases are clear. Hepatobiliary: Liver is within normal limits. Gallbladder is unremarkable. No intrahepatic or extrahepatic ductal dilatation. Pancreas: Within normal limits. Spleen: Within normal limits. Adrenals/Urinary Tract: 2.4 cm right adrenal nodule (series 2/image 17), previously 2.0 cm in 2018, likely reflecting a benign adrenal adenoma. Left adrenal gland is within normal limits. Left kidney is within normal limits. 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 Associated extrinsic compression at the level of the right adnexal mass (described below). Mildly thick-walled bladder, although underdistended. Stable 6 mm left UVJ calcification (series 2/image 68). Stomach/Bowel: Stomach is within normal limits. No evidence of bowel obstruction. Normal appendix (series 2/image 47). Left adnexal mass (described below) is favored to abut and directly invade the left lateral wall of the sigmoid colon (series 2/image 57). Vascular/Lymphatic: No evidence of abdominal aortic aneurysm. Atherosclerotic calcifications of the abdominal aorta and branch vessels. No suspicious abdominopelvic lymphadenopathy. Reproductive: Heterogeneous uterus with associated surface nodularity (series 2/image 65), likely reflecting peritoneal disease. This appearance is improved from the prior. 3.9 x 3.3 cm left adnexal mass (series 2/image 60), previously 5.3 x 3.9 cm, with extension along the sigmoid mesocolon (series 2/image 56) and suspected involvement of the sigmoid colon (series 2/image 57). 5.6 x 2.7 cm mixed cystic/solid right adnexal mass (series 2/image 89), previously 6.9 x 4.3 cm when measured in a similar fashion. Other: No abdominopelvic ascites. Scattered minimal peritoneal nodularity beneath the anterior abdominal wall, including a 5 mm nodule beneath the left anterior abdominal wall (series 2/image 27) which previously  measured 12 mm. Additional mild nodularity on series 2/images 32, 33, and 50. Musculoskeletal: Status post PLIF at L4-S1. Status post lateral fixation at L4-5. Status post anterior fixation at L5-S1. Mild degenerative changes of the lower thoracic spine. IMPRESSION: Improving bilateral adnexa masses in this patient with known ovarian cancer, as above. Suspected involvement of the sigmoid colon. No evidence of bowel obstruction. Improving peritoneal nodularity, as above. Additional ancillary findings as above. Electronically Signed   By: Charline Bills M.D.   On: 08/04/2022 01:27      08/16/2022 Pathology Results   CASE: [id number] PATIENT: [name] Surgical Pathology Report  Clinical History: Advanced gyn malignancy (crm)  FINAL MICROSCOPIC DIAGNOSIS:  A. OMENTUM, RESECTION: - Positive for carcinoma  B. LIVER ADHESION, EXCISION: - Benign fibroadipose tissue - Negative for carcinoma  C. SMALL BOWEL MESENTERIC IMPLANT, EXCISION: - Positive for carcinoma with extensive necrosis  D. UTERUS, CERVIX, BILATERAL FALLOPIAN TUBES AND OVARIES, AND APPENDIX, RESECTION: - Uterine serosa: Tumor deposits present - Cervix: Benign, nabothian cyst - Endometrium: Benign inactive endometrium, benign endometrial polyp - Myometrium: Leiomyoma - Right fallopian tube: Metastatic tumor deposits present - Right ovary: High-grade serous carcinoma - Left ovary: High-grade serous carcinoma - Benign appendix - See oncology table  E. COLON, RECTOSIGMOID, RESECTION: - Benign colonic mucosa - Margins free of carcinoma - Adherent fallopian tube with metastatic tumor deposits - 2 benign lymph nodes  F. RECTAL TUMOR IMPLANT, EXCISION: - Metastatic high-grade serous carcinoma   G. COLON, RECTOSIGMOID ANASTOMOTIC RINGS: - Benign anastomotic donuts  ONCOLOGY TABLE:   OVARY or FALLOPIAN TUBE or PRIMARY PERITONEUM: Resection  Procedure: Total abdominal hysterectomy,  bilateral salpingo-oophorectomy, appendectomy, rectosigmoid resection, total omentectomy, small bowel nodule resection Specimen Integrity: Intact Tumor Site: Bilateral Tumor Size: Right ovary: 3.9 cm, left ovary: Approximately 1.2 cm Histologic Type: High-grade serous carcinoma Histologic Grade: High-grade Ovarian Surface Involvement: Present Fallopian Tube Surface Involvement: Present Implants: Present, small bowel mesenteric and rectovaginal septum Lymphatic and/or Vascular Invasion: Not identified Other Tissue/ Organ Involvement: Fallopian tube, bilateral Largest Extrapelvic Peritoneal Focus: 1.4 cm, small bowel mesentery Peritoneal/Ascitic Fluid Involvement: Not applicable Chemotherapy Response Score (CRS): Not applicable, no known presurgical therapy Regional Lymph Nodes: Not applicable (no lymph nodes submitted or found) Distant Metastasis:      Distant Site(s) Involved: Not applicable Pathologic Stage Classification (pTNM, AJCC 8th Edition): pT3b, pN[not assigned] Ancillary Studies: Can be performed upon request Representative Tumor Block: D8 Comment(s): Tumor is staged as pT3 given the presence of extrapelvic peritoneal deposits on small bowel mesentery less than 2cm (1.4 cm) (v1.3.0.1)    09/18/2022 Tumor Marker   Patient's tumor was tested for the following markers: CA-125. Results of the tumor marker test revealed 36.8.   10/15/2022 Genetic Testing   Negative [facility] Multi-Cancer +RNA Panel.  VUS in POLD1 at c.1322C>T (p.Thr441Met). Report date is 10/15/2022.   The Multi-Cancer + RNA Panel offered by [facility] includes sequencing and/or deletion/duplication analysis of the following 70 genes:  AIP*, ALK, APC*, ATM*, AXIN2*, BAP1*, BARD1*, BLM*, BMPR1A*, BRCA1*, BRCA2*, BRIP1*, CDC73*, CDH1*, CDK4, CDKN1B*, CDKN2A, CHEK2*, CTNNA1*, DICER1*, EPCAM (del/dup only), EGFR, FH*, FLCN*, GREM1 (promoter dup only), HOXB13, KIT, LZTR1, MAX*, MBD4, MEN1*, MET, MITF, MLH1*, MSH2*, MSH3*,  MSH6*, MUTYH*, NF1*, NF2*, NTHL1*, PALB2*, PDGFRA, PMS2*, POLD1*, POLE*, POT1*, PRKAR1A*, PTCH1*, PTEN*, RAD51C*, RAD51D*, RB1*, RET, SDHA* (sequencing only), SDHAF2*, SDHB*, SDHC*, SDHD*, SMAD4*, SMARCA4*, SMARCB1*, SMARCE1*, STK11*, SUFU*, TMEM127*, TP53*, TSC1*, TSC2*, VHL*. RNA analysis is performed for * genes.     PHYSICAL EXAMINATION: ECOG PERFORMANCE STATUS: 1 - Symptomatic but completely ambulatory  Vitals:   11/03/22 0846  BP: (!) 149/90  Pulse: 93  Resp: 18  Temp: (!) 97.4 F (36.3 C)  SpO2: 100%   Filed Weights   11/03/22 0846  Weight: 177 lb 3.2 oz (80.4 kg)    GENERAL:alert, no distress and comfortable NEURO: alert & oriented x 3 with fluent speech, no focal motor/sensory deficits  LABORATORY DATA:  I have reviewed the data as listed    Component Value Date/Time   NA 137 11/03/2022 0818   K 4.1 11/03/2022 0818   CL 103 11/03/2022 0818   CO2 26 11/03/2022 0818   GLUCOSE 151 (H) 11/03/2022 0818   BUN 20 11/03/2022 0818   CREATININE 0.78 11/03/2022 0818   CALCIUM 9.7 11/03/2022 0818   PROT 7.6 11/03/2022 0818   ALBUMIN 4.2 11/03/2022 0818   AST 29 11/03/2022  0818   ALT 30 11/03/2022 0818   ALKPHOS 92 11/03/2022 0818   BILITOT 0.6 11/03/2022 0818   GFRNONAA >60 11/03/2022 0818   GFRAA >60 08/20/2019 1408    No results found for: "SPEP", "UPEP"  Lab Results  Component Value Date   WBC 2.8 (L) 11/03/2022   NEUTROABS 2.2 11/03/2022   HGB 12.5 11/03/2022   HCT 36.2 11/03/2022   MCV 100.3 (H) 11/03/2022   PLT 269 11/03/2022      Chemistry      Component Value Date/Time   NA 137 11/03/2022 0818   K 4.1 11/03/2022 0818   CL 103 11/03/2022 0818   CO2 26 11/03/2022 0818   BUN 20 11/03/2022 0818   CREATININE 0.78 11/03/2022 0818      Component Value Date/Time   CALCIUM 9.7 11/03/2022 0818   ALKPHOS 92 11/03/2022 0818   AST 29 11/03/2022 0818   ALT 30 11/03/2022 0818   BILITOT 0.6 11/03/2022 0818

## 2022-11-03 NOTE — Assessment & Plan Note (Signed)
This is likely due to recent treatment. The patient denies recent history of fevers, cough, chills, diarrhea or dysuria. She is asymptomatic from the leukopenia. I will observe for now.    

## 2022-11-03 NOTE — Patient Instructions (Signed)
[facility]  Discharge Instructions: Thank you for choosing [facility] to provide your oncology and hematology care.   If you have a lab appointment with the Cancer Center, please go directly to the Cancer Center and check in at the registration area.   Wear comfortable clothing and clothing appropriate for easy access to any Portacath or PICC line.   We strive to give you quality time with your provider. You may need to reschedule your appointment if you arrive late (15 or more minutes).  Arriving late affects you and other patients whose appointments are after yours.  Also, if you miss three or more appointments without notifying the office, you may be dismissed from the clinic at the provider's discretion.      For prescription refill requests, have your pharmacy contact our office and allow 72 hours for refills to be completed.    Today you received the following chemotherapy and/or immunotherapy agents - Taxol/Carboplatin      To help prevent nausea and vomiting after your treatment, we encourage you to take your nausea medication as directed.  BELOW ARE SYMPTOMS THAT SHOULD BE REPORTED IMMEDIATELY: *FEVER GREATER THAN 100.4 F (38 C) OR HIGHER *CHILLS OR SWEATING *NAUSEA AND VOMITING THAT IS NOT CONTROLLED WITH YOUR NAUSEA MEDICATION *UNUSUAL SHORTNESS OF BREATH *UNUSUAL BRUISING OR BLEEDING *URINARY PROBLEMS (pain or burning when urinating, or frequent urination) *BOWEL PROBLEMS (unusual diarrhea, constipation, pain near the anus) TENDERNESS IN MOUTH AND THROAT WITH OR WITHOUT PRESENCE OF ULCERS (sore throat, sores in mouth, or a toothache) UNUSUAL RASH, SWELLING OR PAIN  UNUSUAL VAGINAL DISCHARGE OR ITCHING   Items with * indicate a potential emergency and should be followed up as soon as possible or go to the Emergency Department if any problems should occur.  Please show the CHEMOTHERAPY ALERT CARD or IMMUNOTHERAPY ALERT CARD  at check-in to the Emergency Department and triage nurse.  Should you have questions after your visit or need to cancel or reschedule your appointment, please contact [facility]  Dept: [phone]  and follow the prompts.  Office hours are 8:00 a.m. to 4:30 p.m. Monday - Friday. Please note that voicemails left after 4:00 p.m. may not be returned until the following business day.  We are closed weekends and major holidays. You have access to a nurse at all times for urgent questions. Please call the main number to the clinic Dept: [phone] and follow the prompts.   For any non-urgent questions, you may also contact your provider using MyChart. We now offer e-Visits for anyone 18 and older to request care online for non-urgent symptoms. For details visit [email or web address].   Also download the MyChart app! Go to the app store, search "MyChart", open the app, select [facility], and log in with your MyChart username and password.  

## 2022-11-03 NOTE — Assessment & Plan Note (Signed)
She tolerated last cycle therapy well We will proceed with final treatment today Plan to order imaging study next month for further follow-up

## 2022-11-03 NOTE — Assessment & Plan Note (Signed)
Denies worsening neuropathy She will continue her medications as directed

## 2022-11-04 LAB — CA 125: Cancer Antigen (CA) 125: 16.8 U/mL (ref 0.0–38.1)

## 2022-11-19 ENCOUNTER — Encounter: Payer: Self-pay | Admitting: Hematology and Oncology

## 2022-11-19 NOTE — Progress Notes (Signed)
HPI:   Ms. Shakoor was previously seen in the [facility] due to a personal history of ovarian cancer and concerns regarding a hereditary predisposition to cancer.    Ms. Frothingham recent genetic test results were disclosed to her by telephone. These results and recommendations are discussed in more detail below.  CANCER HISTORY:  Oncology History Overview Note  High grade serous, p53 mutated Neg genetics, HRD not detected, MSI stable, low TMB 2   Bilateral primary ovarian cancer (HCC)  04/11/2022 Imaging   CT of the abdomen and pelvis on 04/11/2022 reveals a left adnexal masslike area measuring 5.9 x 3.6 cm.  Margins of this mass are irregular.  There is small fluid in the cul-de-sac as well as fullness of soft tissue about the right adnexa.  Discrete uterine structure is not identified.  Left upper quadrant nodules beneath the left hemidiaphragm (1 anterior to the spleen and the other to the stomach).  Omental/peritoneal nodularity and multiple perihepatic nodules are noted    04/18/2022 Tumor Marker   Patient's tumor was tested for the following markers: CA-125. Results of the tumor marker test revealed 123.   04/19/2022 Initial Diagnosis   Carcinomatosis (HCC)   04/28/2022 Imaging   1. Bulky bilateral ovarian masses and nodules, left-greater-than-right, which are essentially confluent with adjacent pelvic soft tissue nodularity and not significantly changed compared to prior examination dated 04/11/2022. 2. Extensive pelvic peritoneal thickening and nodularity. Multiple small peritoneal nodules throughout the abdomen and pelvis. Findings are consistent with peritoneal metastatic disease and likewise not significantly changed. 3. Small volume of loculated appearing fluid in the low pelvis. 4. 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 The mid to distal right ureter is  obstructed by right ovarian mass and or soft tissue nodularity. 5. Calculus within the most distal left ureterovesicular junction or just within the bladder lumen measuring 0.7 cm, unchanged. No associated left-sided hydronephrosis. 6. Soft tissue attenuation nodule of the body of the right adrenal gland. Notably, this was also soft tissue attenuation on prior noncontrast examination (i.e. not definitively macroscopic fat containing) although unchanged compared to prior examinations dating back to 2018 and almost certainly a benign adenoma. Attention on follow-up. 7. No evidence of metastatic disease in the chest. 8. Mild diffuse bilateral bronchial wall thickening. Background of very fine centrilobular nodularity, most concentrated in the lung apices. Findings are most consistent with smoking-related respiratory bronchiolitis.     05/18/2022 Procedure   Ultrasound-guided core biopsy performed of a 10 mm soft tissue peritoneal mass in the right upper quadrant just superficial to the right lobe of the liver. The procedure was performed under general anesthesia immediately following Port-A-Cath placement.   05/18/2022 Procedure   Placement of single lumen port a cath via right internal jugular vein. The catheter tip lies at the cavo-atrial junction. A power injectable port a cath was placed and is ready for immediate use.     05/18/2022 Pathology Results   A. 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  The findings support the above interpretation.  This case was reviewed with Dr. Kenard Gower who agrees with the above diagnosis.  A p16 stain is pending and will be reported in an addendum.    05/23/2022 Cancer Staging   Staging form: Ovary, Fallopian Tube,  and Primary Peritoneal Carcinoma, AJCC 8th Edition - Clinical stage from 05/23/2022:  FIGO Stage IIIC (cT3c, cN0, cM0) - Signed by Artis Delay, MD on 05/23/2022 Stage prefix: Initial diagnosis   05/29/2022 Imaging   1. Complex bilateral adnexal masses, as described above, consistent with the patient's known malignancy. 2. Subsequent marked severity mass effect on the distal bilateral common iliac veins, increased in severity when compared to the prior exam. 3. Additional findings consistent with peritoneal metastasis within the pelvis. 4. Findings consistent with pelvic congestion syndrome. 5. Postoperative changes within the mid and lower lumbar spine. 6. Aortic atherosclerosis. 7. No evidence of venous thrombus within the abdomen, pelvis or proximal bilateral lower extremities.   Aortic Atherosclerosis (ICD10-I70.0).   06/02/2022 -  Chemotherapy   Patient is on Treatment Plan : OVARIAN Carboplatin (AUC 6) + Paclitaxel (175) q21d X 6 Cycles     06/26/2022 Tumor Marker   Patient's tumor was tested for the following markers: CA-125. Results of the tumor marker test revealed 134.   08/04/2022 Imaging   CT ABDOMEN PELVIS W CONTRAST  Result Date: 08/04/2022 CLINICAL DATA:  Ovarian cancer, chemotherapy in progress, for restaging EXAM: CT ABDOMEN AND PELVIS WITH CONTRAST TECHNIQUE: Multidetector CT imaging of the abdomen and pelvis was performed using the standard protocol following bolus administration of intravenous contrast. RADIATION DOSE REDUCTION: This exam was performed according to the departmental dose-optimization program which includes automated exposure control, adjustment of the mA and/or kV according to patient size and/or use of iterative reconstruction technique. CONTRAST:  OMNIPAQUE IOHEXOL 300 MG/ML  SOLN COMPARISON:  CT venogram abdomen/pelvis dated 05/29/2022. CT abdomen/pelvis dated 04/26/2022. FINDINGS: Lower chest: Lung bases are clear. Hepatobiliary: Liver is within normal limits. Gallbladder is unremarkable. No intrahepatic or extrahepatic ductal  dilatation. Pancreas: Within normal limits. Spleen: Within normal limits. Adrenals/Urinary Tract: 2.4 cm right adrenal nodule (series 2/image 17), previously 2.0 cm in 2018, likely reflecting a benign adrenal adenoma. Left adrenal gland is within normal limits. Left kidney is within normal limits. 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 Associated extrinsic compression at the level of the right adnexal mass (described below). Mildly thick-walled bladder, although underdistended. Stable 6 mm left UVJ calcification (series 2/image 68). Stomach/Bowel: Stomach is within normal limits. No evidence of bowel obstruction. Normal appendix (series 2/image 47). Left adnexal mass (described below) is favored to abut and directly invade the left lateral wall of the sigmoid colon (series 2/image 57). Vascular/Lymphatic: No evidence of abdominal aortic aneurysm. Atherosclerotic calcifications of the abdominal aorta and branch vessels. No suspicious abdominopelvic lymphadenopathy. Reproductive: Heterogeneous uterus with associated surface nodularity (series 2/image 65), likely reflecting peritoneal disease. This appearance is improved from the prior. 3.9 x 3.3 cm left adnexal mass (series 2/image 60), previously 5.3 x 3.9 cm, with extension along the sigmoid mesocolon (series 2/image 56) and suspected involvement of the sigmoid colon (series 2/image 57). 5.6 x 2.7 cm mixed cystic/solid right adnexal mass (series 2/image 89), previously 6.9 x 4.3 cm when measured in a similar fashion. Other: No abdominopelvic ascites. Scattered minimal peritoneal nodularity beneath the anterior abdominal wall, including a 5 mm nodule beneath the left anterior abdominal wall (series 2/image 27) which previously measured 12 mm. Additional mild nodularity on series 2/images 32, 33, and 50. Musculoskeletal: Status post PLIF at L4-S1. Status post lateral  fixation at L4-5. Status post anterior fixation at L5-S1. Mild degenerative changes of the lower thoracic spine. IMPRESSION: Improving bilateral adnexa  masses in this patient with known ovarian cancer, as above. Suspected involvement of the sigmoid colon. No evidence of bowel obstruction. Improving peritoneal nodularity, as above. Additional ancillary findings as above. Electronically Signed   By: Charline Bills M.D.   On: 08/04/2022 01:27      08/16/2022 Pathology Results   CASE: [id number] PATIENT: Donnamae Jude Surgical Pathology Report  Clinical History: Advanced gyn malignancy (crm)  FINAL MICROSCOPIC DIAGNOSIS:  A. OMENTUM, RESECTION: - Positive for carcinoma  B. LIVER ADHESION, EXCISION: - Benign fibroadipose tissue - Negative for carcinoma  C. SMALL BOWEL MESENTERIC IMPLANT, EXCISION: - Positive for carcinoma with extensive necrosis  D. UTERUS, CERVIX, BILATERAL FALLOPIAN TUBES AND OVARIES, AND APPENDIX, RESECTION: - Uterine serosa: Tumor deposits present - Cervix: Benign, nabothian cyst - Endometrium: Benign inactive endometrium, benign endometrial polyp - Myometrium: Leiomyoma - Right fallopian tube: Metastatic tumor deposits present - Right ovary: High-grade serous carcinoma - Left ovary: High-grade serous carcinoma - Benign appendix - See oncology table  E. COLON, RECTOSIGMOID, RESECTION: - Benign colonic mucosa - Margins free of carcinoma - Adherent fallopian tube with metastatic tumor deposits - 2 benign lymph nodes  F. RECTAL TUMOR IMPLANT, EXCISION: - Metastatic high-grade serous carcinoma   G. COLON, RECTOSIGMOID ANASTOMOTIC RINGS: - Benign anastomotic donuts  ONCOLOGY TABLE:   OVARY or FALLOPIAN TUBE or PRIMARY PERITONEUM: Resection  Procedure: Total abdominal hysterectomy, bilateral salpingo-oophorectomy, appendectomy, rectosigmoid resection, total omentectomy, small bowel nodule resection Specimen Integrity: Intact Tumor Site:  Bilateral Tumor Size: Right ovary: 3.9 cm, left ovary: Approximately 1.2 cm Histologic Type: High-grade serous carcinoma Histologic Grade: High-grade Ovarian Surface Involvement: Present Fallopian Tube Surface Involvement: Present Implants: Present, small bowel mesenteric and rectovaginal septum Lymphatic and/or Vascular Invasion: Not identified Other Tissue/ Organ Involvement: Fallopian tube, bilateral Largest Extrapelvic Peritoneal Focus: 1.4 cm, small bowel mesentery Peritoneal/Ascitic Fluid Involvement: Not applicable Chemotherapy Response Score (CRS): Not applicable, no known presurgical therapy Regional Lymph Nodes: Not applicable (no lymph nodes submitted or found) Distant Metastasis:      Distant Site(s) Involved: Not applicable Pathologic Stage Classification (pTNM, AJCC 8th Edition): pT3b, pN[not assigned] Ancillary Studies: Can be performed upon request Representative Tumor Block: D8 Comment(s): Tumor is staged as pT3 given the presence of extrapelvic peritoneal deposits on small bowel mesentery less than 2cm (1.4 cm) (v1.3.0.1)    09/18/2022 Tumor Marker   Patient's tumor was tested for the following markers: CA-125. Results of the tumor marker test revealed 36.8.   10/15/2022 Genetic Testing   Negative [facility] Multi-Cancer +RNA Panel.  VUS in POLD1 at c.1322C>T (p.Thr441Met). Report date is 10/15/2022.   The Multi-Cancer + RNA Panel offered by [facility] includes sequencing and/or deletion/duplication analysis of the following 70 genes:  AIP*, ALK, APC*, ATM*, AXIN2*, BAP1*, BARD1*, BLM*, BMPR1A*, BRCA1*, BRCA2*, BRIP1*, CDC73*, CDH1*, CDK4, CDKN1B*, CDKN2A, CHEK2*, CTNNA1*, DICER1*, EPCAM (del/dup only), EGFR, FH*, FLCN*, GREM1 (promoter dup only), HOXB13, KIT, LZTR1, MAX*, MBD4, MEN1*, MET, MITF, MLH1*, MSH2*, MSH3*, MSH6*, MUTYH*, NF1*, NF2*, NTHL1*, PALB2*, PDGFRA, PMS2*, POLD1*, POLE*, POT1*, PRKAR1A*, PTCH1*, PTEN*, RAD51C*, RAD51D*, RB1*, RET, SDHA* (sequencing only),  SDHAF2*, SDHB*, SDHC*, SDHD*, SMAD4*, SMARCA4*, SMARCB1*, SMARCE1*, STK11*, SUFU*, TMEM127*, TP53*, TSC1*, TSC2*, VHL*. RNA analysis is performed for * genes.   11/06/2022 Tumor Marker   Patient's tumor was tested for the following markers: CA-125. Results of the tumor marker test revealed 16.8.     FAMILY HISTORY:  We obtained a detailed, 4-generation family history.  Significant diagnoses are listed below:      Family History  Problem Relation Age of Onset   Breast cancer Maternal Aunt          dx <50   Stomach cancer Maternal Grandfather          mets to liver? dx after 29   Breast cancer Cousin          mat female cousin; dx unknown age   Colon cancer Cousin 87        mat female cousin; mets   Leukemia Cousin 84        mat female cousin       Ms. Moser is unaware of previous family history of genetic testing for hereditary cancer risks. There is no reported Ashkenazi Jewish ancestry. There is no known consanguinity.  GENETIC TEST RESULTS:  The [facility] Multi-Cancer +RNA Panel found no pathogenic mutations.   The Multi-Cancer + RNA Panel offered by [facility] includes sequencing and/or deletion/duplication analysis of the following 70 genes:  AIP*, ALK, APC*, ATM*, AXIN2*, BAP1*, BARD1*, BLM*, BMPR1A*, BRCA1*, BRCA2*, BRIP1*, CDC73*, CDH1*, CDK4, CDKN1B*, CDKN2A, CHEK2*, CTNNA1*, DICER1*, EPCAM (del/dup only), EGFR, FH*, FLCN*, GREM1 (promoter dup only), HOXB13, KIT, LZTR1, MAX*, MBD4, MEN1*, MET, MITF, MLH1*, MSH2*, MSH3*, MSH6*, MUTYH*, NF1*, NF2*, NTHL1*, PALB2*, PDGFRA, PMS2*, POLD1*, POLE*, POT1*, PRKAR1A*, PTCH1*, PTEN*, RAD51C*, RAD51D*, RB1*, RET, SDHA* (sequencing only), SDHAF2*, SDHB*, SDHC*, SDHD*, SMAD4*, SMARCA4*, SMARCB1*, SMARCE1*, STK11*, SUFU*, TMEM127*, TP53*, TSC1*, TSC2*, VHL*. RNA analysis is performed for * genes. .   The test report has been scanned into EPIC and is located under the Molecular Pathology section of the Results Review tab.  A portion of the result  report is included below for reference. Genetic testing reported out on October 15, 2022.     Genetic testing identified a variant of uncertain significance (VUS) in the POLD1 gene called c.1322C>T (p.Thr441Met).  At this time, it is unknown if this variant is associated with an increased risk for cancer or if it is benign, but most uncertain variants are reclassified to benign. It should not be used to make medical management decisions. With time, we suspect the laboratory will determine the significance of this variant, if any. If the laboratory reclassifies this variant, we will attempt to contact Ms. Orders to discuss it further.   Even though a pathogenic variant was not identified, possible explanations for the cancer in the family may include: There may be no hereditary risk for cancer in the family. The cancers in Ms. Hollinghead and/or her family may be sporadic/familial or due to other genetic and environmental factors. There may be a gene mutation in one of these genes that current testing methods cannot detect but that chance is small. There could be another gene that has not yet been discovered, or that we have not yet tested, that is responsible for the cancer diagnoses in the family.  It is also possible there is a hereditary cause for the cancer in the family that Ms. Friess did not inherit.   Therefore, it is important to remain in touch with cancer genetics in the future so that we can continue to offer Ms. Foston the most up to date genetic testing.   ADDITIONAL GENETIC TESTING:   Ms. Stute genetic testing was fairly extensive.  If there are additional relevant genes identified to increase cancer risk that can be analyzed in the future, we would be happy to discuss and coordinate this testing at that time.      CANCER SCREENING RECOMMENDATIONS:  Ms. Moghadam test result is considered  negative (normal).  This means that we have not identified a hereditary cause for her personal  history of ovarian cancer at this time.   An individual's cancer risk and medical management are not determined by genetic test results alone. Overall cancer risk assessment incorporates additional factors, including personal medical history, family history, and any available genetic information that may result in a personalized plan for cancer prevention and surveillance. Therefore, it is recommended she continue to follow the cancer management and screening guidelines provided by her oncology and primary healthcare provider.  RECOMMENDATIONS FOR FAMILY MEMBERS:   Since she did not inherit a identifiable mutation in a cancer predisposition gene included on this panel, her children could not have inherited a known mutation from her in one of these genes. Individuals in this family might be at some increased risk of developing cancer, over the general population risk, due to the family history of cancer.  Individuals in the family should notify their providers of the family history of cancer. We recommend women in this family have a yearly mammogram beginning at age 74, or 26 years younger than the earliest onset of cancer, an annual clinical breast exam, and perform monthly breast self-exams.  Risk models that take into account family history and hormonal history may be helpful in determining appropriate breast cancer screening options for family members.  Other members of the family may still carry a pathogenic variant in one of these genes that Ms. Rizzolo did not inherit. Based on the family history, we recommend her maternal cousin with a history of breast cancer  have genetic counseling and testing. Ms. Henard can let us know if we can be of any assistance in coordinating genetic counseling and/or testing for these family member.   We do not recommend familial testing for the POLD1 variant of uncertain significance (VUS).  FOLLOW-UP:  Cancer genetics is a rapidly advancing field and it is possible  that new genetic tests will be appropriate for her and/or her family members in the future. We encourage Ms. Deline to remain in contact with cancer genetics, so we can update her personal and family histories and let her know of advances in cancer genetics that may benefit this family.   Our contact number was provided.  She knows she is welcome to call us at anytime with additional questions or concerns.   Ahsley Attwood M. Rennie Plowman, MS, [facility] Genetic Counselor [email or web address] (P) [phone]

## 2022-11-29 ENCOUNTER — Telehealth: Payer: Self-pay

## 2022-11-29 NOTE — Telephone Encounter (Signed)
Reviewed and confirmed upcoming appointments with patient.  Patient informed RN that her CT scan had been postponed to 8/2, however patient is still scheduled to see Dr. Bertis Ruddy on 8/1.  Patient informed that this appointment may be changed to a later date so that Dr. Bertis Ruddy can review CT scan results with her. Patient agreeable and knows that she may be called with updated appointment date and time.  Message sent to confirm change in appointment with Dr. Bertis Ruddy.

## 2022-11-30 ENCOUNTER — Telehealth: Payer: Self-pay | Admitting: Hematology and Oncology

## 2022-11-30 NOTE — Telephone Encounter (Signed)
Called twice; Left a message regarding upcoming appointment times/dates

## 2022-12-04 ENCOUNTER — Other Ambulatory Visit: Payer: 59

## 2022-12-05 ENCOUNTER — Telehealth: Payer: Self-pay

## 2022-12-05 NOTE — Telephone Encounter (Signed)
Called and moved port lab/ flush appt to 8/2 at 0830. She is aware of appt.

## 2022-12-07 ENCOUNTER — Other Ambulatory Visit: Payer: Self-pay

## 2022-12-07 ENCOUNTER — Other Ambulatory Visit: Payer: 59

## 2022-12-07 ENCOUNTER — Ambulatory Visit: Payer: 59 | Admitting: Hematology and Oncology

## 2022-12-07 DIAGNOSIS — C563 Malignant neoplasm of bilateral ovaries: Secondary | ICD-10-CM

## 2022-12-08 ENCOUNTER — Inpatient Hospital Stay: Payer: 59 | Attending: Gynecologic Oncology

## 2022-12-08 ENCOUNTER — Other Ambulatory Visit: Payer: Self-pay

## 2022-12-08 ENCOUNTER — Ambulatory Visit (HOSPITAL_COMMUNITY)
Admission: RE | Admit: 2022-12-08 | Discharge: 2022-12-08 | Disposition: A | Discharge status: Home / Self Care | Payer: 59 | Source: Ambulatory Visit | Attending: Hematology and Oncology | Admitting: Hematology and Oncology

## 2022-12-08 DIAGNOSIS — G620 Drug-induced polyneuropathy: Secondary | ICD-10-CM | POA: Insufficient documentation

## 2022-12-08 DIAGNOSIS — C563 Malignant neoplasm of bilateral ovaries: Secondary | ICD-10-CM | POA: Insufficient documentation

## 2022-12-08 DIAGNOSIS — Q630 Accessory kidney: Secondary | ICD-10-CM | POA: Diagnosis not present

## 2022-12-08 DIAGNOSIS — K5909 Other constipation: Secondary | ICD-10-CM | POA: Diagnosis not present

## 2022-12-08 DIAGNOSIS — Z95828 Presence of other vascular implants and grafts: Secondary | ICD-10-CM

## 2022-12-08 DIAGNOSIS — N1330 Unspecified hydronephrosis: Secondary | ICD-10-CM | POA: Diagnosis not present

## 2022-12-08 LAB — CBC WITH DIFFERENTIAL (CANCER CENTER ONLY)
Abs Immature Granulocytes: 0.01 10*3/uL (ref 0.00–0.07)
Basophils Absolute: 0 10*3/uL (ref 0.0–0.1)
Basophils Relative: 1 %
Eosinophils Absolute: 0.2 10*3/uL (ref 0.0–0.5)
Eosinophils Relative: 4 %
HCT: 34.3 % — ABNORMAL LOW (ref 36.0–46.0)
Hemoglobin: 12 g/dL (ref 12.0–15.0)
Immature Granulocytes: 0 %
Lymphocytes Relative: 42 %
Lymphs Abs: 1.8 10*3/uL (ref 0.7–4.0)
MCH: 35.6 pg — ABNORMAL HIGH (ref 26.0–34.0)
MCHC: 35 g/dL (ref 30.0–36.0)
MCV: 101.8 fL — ABNORMAL HIGH (ref 80.0–100.0)
Monocytes Absolute: 0.7 10*3/uL (ref 0.1–1.0)
Monocytes Relative: 16 %
Neutro Abs: 1.6 10*3/uL — ABNORMAL LOW (ref 1.7–7.7)
Neutrophils Relative %: 37 %
Platelet Count: 211 10*3/uL (ref 150–400)
RBC: 3.37 MIL/uL — ABNORMAL LOW (ref 3.87–5.11)
RDW: 14.1 % (ref 11.5–15.5)
WBC Count: 4.3 10*3/uL (ref 4.0–10.5)
nRBC: 0 % (ref 0.0–0.2)

## 2022-12-08 LAB — CMP (CANCER CENTER ONLY)
ALT: 22 U/L (ref 0–44)
AST: 18 U/L (ref 15–41)
Albumin: 4.2 g/dL (ref 3.5–5.0)
Alkaline Phosphatase: 79 U/L (ref 38–126)
Anion gap: 6 (ref 5–15)
BUN: 25 mg/dL — ABNORMAL HIGH (ref 8–23)
CO2: 27 mmol/L (ref 22–32)
Calcium: 9.1 mg/dL (ref 8.9–10.3)
Chloride: 107 mmol/L (ref 98–111)
Creatinine: 1.01 mg/dL — ABNORMAL HIGH (ref 0.44–1.00)
GFR, Estimated: >60 mL/min (ref >60)
Glucose, Bld: 105 mg/dL — ABNORMAL HIGH (ref 70–99)
Potassium: 3.5 mmol/L (ref 3.5–5.1)
Sodium: 140 mmol/L (ref 135–145)
Total Bilirubin: 0.3 mg/dL (ref 0.3–1.2)
Total Protein: 6.9 g/dL (ref 6.5–8.1)

## 2022-12-08 MED ORDER — IOHEXOL 9 MG/ML PO SOLN
1000.0000 mL | Freq: Once | ORAL | Status: AC
  Administered 2022-12-08: 1000 mL via ORAL

## 2022-12-08 MED ORDER — SODIUM CHLORIDE (PF) 0.9 % IJ SOLN
INTRAMUSCULAR | Status: AC
  Filled 2022-12-08: qty 50

## 2022-12-08 MED ORDER — HEPARIN SOD (PORK) LOCK FLUSH 100 UNIT/ML IV SOLN
INTRAVENOUS | Status: AC
  Filled 2022-12-08: qty 5

## 2022-12-08 MED ORDER — IOHEXOL 9 MG/ML PO SOLN
ORAL | Status: AC
  Filled 2022-12-08: qty 1000

## 2022-12-08 MED ORDER — HEPARIN SOD (PORK) LOCK FLUSH 100 UNIT/ML IV SOLN
500.0000 [IU] | Freq: Once | INTRAVENOUS | Status: AC
  Administered 2022-12-08: 500 [IU] via INTRAVENOUS

## 2022-12-08 MED ORDER — SODIUM CHLORIDE 0.9% FLUSH
10.0000 mL | Freq: Once | INTRAVENOUS | Status: AC
  Administered 2022-12-08: 10 mL

## 2022-12-08 MED ORDER — IOHEXOL 300 MG/ML  SOLN
100.0000 mL | Freq: Once | INTRAMUSCULAR | Status: AC | PRN
  Administered 2022-12-08: 100 mL via INTRAVENOUS

## 2022-12-14 ENCOUNTER — Encounter: Payer: Self-pay | Admitting: Hematology and Oncology

## 2022-12-14 ENCOUNTER — Inpatient Hospital Stay (HOSPITAL_BASED_OUTPATIENT_CLINIC_OR_DEPARTMENT_OTHER): Payer: 59 | Admitting: Hematology and Oncology

## 2022-12-14 ENCOUNTER — Other Ambulatory Visit: Payer: Self-pay

## 2022-12-14 VITALS — BP 169/90 | HR 81 | Temp 98.6°F | Resp 18 | Ht 68.9 in | Wt 180.4 lb

## 2022-12-14 DIAGNOSIS — C563 Malignant neoplasm of bilateral ovaries: Secondary | ICD-10-CM

## 2022-12-14 DIAGNOSIS — G620 Drug-induced polyneuropathy: Secondary | ICD-10-CM | POA: Diagnosis not present

## 2022-12-14 DIAGNOSIS — K5909 Other constipation: Secondary | ICD-10-CM | POA: Diagnosis not present

## 2022-12-14 DIAGNOSIS — T451X5A Adverse effect of antineoplastic and immunosuppressive drugs, initial encounter: Secondary | ICD-10-CM | POA: Diagnosis not present

## 2022-12-14 MED ORDER — PREGABALIN 25 MG PO CAPS: 25.0000 mg | ORAL_CAPSULE | Freq: Every day | ORAL | 1 refills | Status: DC

## 2022-12-14 NOTE — Progress Notes (Signed)
[facility] OFFICE PROGRESS NOTE  Patient Care Team: Noberto Retort, MD as PCP - General (Family Medicine) Noel Christmas (Physician Assistant)  ASSESSMENT & PLAN:  Bilateral primary ovarian cancer [facility]) She completed her treatment, complicated by persistent neuropathy We discussed future follow-up The patient wants a port to be removed She will be seen by GYN surgeon in 3 months and I will see her in 6 months with repeat imaging study We discussed treatment options for neuropathy  Peripheral neuropathy due to chemotherapy [facility]) This is worse since completion of treatment Anticipate improvement with time away from chemotherapy We discussed risk and benefits of trial of Lyrica and she is in agreement to proceed I plan to call her in about 2 weeks to assess response to treatment  Other constipation We discussed importance of aggressive laxative therapy  Orders Placed This Encounter  Procedures   IR REMOVAL TUN ACCESS W/ PORT W/O FL MOD SED    Standing Status:   Future    Standing Expiration Date:   12/14/2023    Order Specific Question:   Reason for exam:    Answer:   no need port    Order Specific Question:   Preferred Imaging Location?    Answer:   [facility]   CT ABDOMEN PELVIS W CONTRAST    Standing Status:   Future    Standing Expiration Date:   12/14/2023    Order Specific Question:   If indicated for the ordered procedure, I authorize the administration of contrast media per Radiology protocol    Answer:   Yes    Order Specific Question:   Does the patient have a contrast media/X-ray dye allergy?    Answer:   No    Order Specific Question:   Preferred imaging location?    Answer:   [facility]    Order Specific Question:   If indicated for the ordered procedure, I authorize the administration of oral contrast media per Radiology protocol    Answer:   Yes   CBC with Differential/Platelet    Standing Status:   Standing     Number of Occurrences:   22    Standing Expiration Date:   12/14/2023   Comprehensive metabolic panel    Standing Status:   Standing    Number of Occurrences:   33    Standing Expiration Date:   12/14/2023    All questions were answered. The patient knows to call the clinic with any problems, questions or concerns. The total time spent in the appointment was 40 minutes encounter with patients including review of chart and various tests results, discussions about plan of care and coordination of care plan   Artis Delay, MD 12/14/2022 10:37 AM  INTERVAL HISTORY: Please see below for problem oriented charting. she returns for review of test results She has worsening neuropathy since the last treatment She also have significant constipation We reviewed imaging studies extensively and discussed future follow-up  REVIEW OF SYSTEMS:   Constitutional: Denies fevers, chills or abnormal weight loss Eyes: Denies blurriness of vision Ears, nose, mouth, throat, and face: Denies mucositis or sore throat Respiratory: Denies cough, dyspnea or wheezes Cardiovascular: Denies palpitation, chest discomfort or lower extremity swelling Skin: Denies abnormal skin rashes Lymphatics: Denies new lymphadenopathy or easy bruising Behavioral/Psych: Mood is stable, no new changes  All other systems were reviewed with the patient and are negative.  I have reviewed the past medical history, past surgical  history, social history and family history with the patient and they are unchanged from previous note.  ALLERGIES:  is allergic to cymbalta [duloxetine hcl], cyclobenzaprine hcl, celexa  [citalopram hydrobromide], fluticasone furoate-vilanterol, gabapentin, opana  [oxymorphone hcl], and tramadol.  MEDICATIONS:  Current Outpatient Medications  Medication Sig Dispense Refill   pregabalin (LYRICA) 25 MG capsule Take 1 capsule (25 mg total) by mouth daily. 30 capsule 1   albuterol (VENTOLIN HFA) 108 (90 Base) MCG/ACT  inhaler Inhale 2 puffs into the lungs every 6 (six) hours as needed for shortness of breath.     amphetamine-dextroamphetamine (ADDERALL) 20 MG tablet Take 10-20 mg by mouth See admin instructions. Take 20 mg by mouth in the morning and 10 mg between 1-2 PM daily     Ascorbic Acid (VITAMIN C) 1000 MG tablet Take 1,000 mg by mouth daily.     ASHWAGANDHA PO Take 1 capsule by mouth daily.     BLACK CURRANT SEED OIL PO Take 1 capsule by mouth daily.     diclofenac Sodium (VOLTAREN) 1 % GEL Apply 2 g topically 2 (two) times daily.     LORazepam (ATIVAN) 0.5 MG tablet Take 0.5 mg by mouth 3 (three) times daily as needed for anxiety.     Melatonin 10 MG TABS Take 10 mg by mouth at bedtime as needed (sleep).     Multiple Vitamin (MULTIVITAMIN WITH MINERALS) TABS tablet Take 1 tablet by mouth daily.     mupirocin ointment (BACTROBAN) 2 % Apply 1 Application topically 3 (three) times daily as needed (HS flares).     naloxone (NARCAN) nasal spray 4 mg/0.1 mL Place 1 spray into the nose as needed (opioid overdose).     OVER THE COUNTER MEDICATION Take 2 capsules by mouth daily. Sea Moss advanced supplement     oxyCODONE ER (XTAMPZA ER) 13.5 MG C12A Take 13.5 mg by mouth in the morning, at noon, and at bedtime.     Oxycodone HCl 20 MG TABS Take 20 mg by mouth 5 (five) times daily as needed (pain).     polyethylene glycol (MIRALAX / GLYCOLAX) 17 g packet Take 17 g by mouth daily.     Vitamin D, Ergocalciferol, (DRISDOL) 1.25 MG (50000 UNIT) CAPS capsule Take 50,000 Units by mouth every Sunday.     WIXELA INHUB 250-50 MCG/ACT AEPB Inhale 1 puff into the lungs daily.     No current facility-administered medications for this visit.    SUMMARY OF ONCOLOGIC HISTORY: Oncology History Overview Note  High grade serous, p53 mutated Neg genetics, HRD not detected, MSI stable, low TMB 2   Bilateral primary ovarian cancer (HCC)  04/11/2022 Imaging   CT of the abdomen and pelvis on 04/11/2022 reveals a left adnexal  masslike area measuring 5.9 x 3.6 cm.  Margins of this mass are irregular.  There is small fluid in the cul-de-sac as well as fullness of soft tissue about the right adnexa.  Discrete uterine structure is not identified.  Left upper quadrant nodules beneath the left hemidiaphragm (1 anterior to the spleen and the other to the stomach).  Omental/peritoneal nodularity and multiple perihepatic nodules are noted    04/18/2022 Tumor Marker   Patient's tumor was tested for the following markers: CA-125. Results of the tumor marker test revealed 123.   04/19/2022 Initial Diagnosis   Carcinomatosis (HCC)   04/28/2022 Imaging   1. Bulky bilateral ovarian masses and nodules, left-greater-than-right, which are essentially confluent with adjacent pelvic soft tissue nodularity and not  significantly changed compared to prior examination dated 04/11/2022. 2. Extensive pelvic peritoneal thickening and nodularity. Multiple small peritoneal nodules throughout the abdomen and pelvis. Findings are consistent with peritoneal metastatic disease and likewise not significantly changed. 3. Small volume of loculated appearing fluid in the low pelvis. 4. 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 The mid to distal right ureter is obstructed by right ovarian mass and or soft tissue nodularity. 5. Calculus within the most distal left ureterovesicular junction or just within the bladder lumen measuring 0.7 cm, unchanged. No associated left-sided hydronephrosis. 6. Soft tissue attenuation nodule of the body of the right adrenal gland. Notably, this was also soft tissue attenuation on prior noncontrast examination (i.e. not definitively macroscopic fat containing) although unchanged compared to prior examinations dating back to 2018 and almost certainly a benign adenoma. Attention on follow-up. 7. No evidence of metastatic disease  in the chest. 8. Mild diffuse bilateral bronchial wall thickening. Background of very fine centrilobular nodularity, most concentrated in the lung apices. Findings are most consistent with smoking-related respiratory bronchiolitis.     05/18/2022 Procedure   Ultrasound-guided core biopsy performed of a 10 mm soft tissue peritoneal mass in the right upper quadrant just superficial to the right lobe of the liver. The procedure was performed under general anesthesia immediately following Port-A-Cath placement.   05/18/2022 Procedure   Placement of single lumen port a cath via right internal jugular vein. The catheter tip lies at the cavo-atrial junction. A power injectable port a cath was placed and is ready for immediate use.     05/18/2022 Pathology Results   A. 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  The findings support the above interpretation.  This case was reviewed with Dr. Kenard Gower who agrees with the above diagnosis.  A p16 stain is pending and will be reported in an addendum.    05/23/2022 Cancer Staging   Staging form: Ovary, Fallopian Tube, and Primary Peritoneal Carcinoma, AJCC 8th Edition - Clinical stage from 05/23/2022: FIGO Stage IIIC (cT3c, cN0, cM0) - Signed by Artis Delay, MD on 05/23/2022 Stage prefix: Initial diagnosis   05/29/2022 Imaging   1. Complex bilateral adnexal masses, as described above, consistent with the patient's known malignancy. 2. Subsequent marked severity mass effect on the distal bilateral common iliac veins, increased in severity when compared to the prior exam. 3. Additional findings consistent with peritoneal metastasis within the pelvis. 4. Findings consistent with pelvic congestion syndrome. 5. Postoperative changes within the mid and lower lumbar spine. 6. Aortic  atherosclerosis. 7. No evidence of venous thrombus within the abdomen, pelvis or proximal bilateral lower extremities.   Aortic Atherosclerosis (ICD10-I70.0).   06/02/2022 - 11/03/2022 Chemotherapy   Patient is on Treatment Plan : OVARIAN Carboplatin (AUC 6) + Paclitaxel (175) q21d X 6 Cycles     06/26/2022 Tumor Marker   Patient's tumor was tested for the following markers: CA-125. Results of the tumor marker test revealed 134.   08/04/2022 Imaging   CT ABDOMEN PELVIS W CONTRAST  Result Date: 08/04/2022 CLINICAL DATA:  Ovarian cancer, chemotherapy in progress, for restaging EXAM: CT ABDOMEN AND PELVIS WITH CONTRAST TECHNIQUE: Multidetector CT imaging of the abdomen and pelvis was performed using the standard protocol following bolus administration of intravenous contrast. RADIATION DOSE REDUCTION: This exam was  performed according to the departmental dose-optimization program which includes automated exposure control, adjustment of the mA and/or kV according to patient size and/or use of iterative reconstruction technique. CONTRAST:  OMNIPAQUE IOHEXOL 300 MG/ML  SOLN COMPARISON:  CT venogram abdomen/pelvis dated 05/29/2022. CT abdomen/pelvis dated 04/26/2022. FINDINGS: Lower chest: Lung bases are clear. Hepatobiliary: Liver is within normal limits. Gallbladder is unremarkable. No intrahepatic or extrahepatic ductal dilatation. Pancreas: Within normal limits. Spleen: Within normal limits. Adrenals/Urinary Tract: 2.4 cm right adrenal nodule (series 2/image 17), previously 2.0 cm in 2018, likely reflecting a benign adrenal adenoma. Left adrenal gland is within normal limits. Left kidney is within normal limits. 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 Associated extrinsic compression at the level of the right adnexal mass (described below). Mildly thick-walled bladder, although underdistended. Stable 6 mm  left UVJ calcification (series 2/image 68). Stomach/Bowel: Stomach is within normal limits. No evidence of bowel obstruction. Normal appendix (series 2/image 47). Left adnexal mass (described below) is favored to abut and directly invade the left lateral wall of the sigmoid colon (series 2/image 57). Vascular/Lymphatic: No evidence of abdominal aortic aneurysm. Atherosclerotic calcifications of the abdominal aorta and branch vessels. No suspicious abdominopelvic lymphadenopathy. Reproductive: Heterogeneous uterus with associated surface nodularity (series 2/image 65), likely reflecting peritoneal disease. This appearance is improved from the prior. 3.9 x 3.3 cm left adnexal mass (series 2/image 60), previously 5.3 x 3.9 cm, with extension along the sigmoid mesocolon (series 2/image 56) and suspected involvement of the sigmoid colon (series 2/image 57). 5.6 x 2.7 cm mixed cystic/solid right adnexal mass (series 2/image 89), previously 6.9 x 4.3 cm when measured in a similar fashion. Other: No abdominopelvic ascites. Scattered minimal peritoneal nodularity beneath the anterior abdominal wall, including a 5 mm nodule beneath the left anterior abdominal wall (series 2/image 27) which previously measured 12 mm. Additional mild nodularity on series 2/images 32, 33, and 50. Musculoskeletal: Status post PLIF at L4-S1. Status post lateral fixation at L4-5. Status post anterior fixation at L5-S1. Mild degenerative changes of the lower thoracic spine. IMPRESSION: Improving bilateral adnexa masses in this patient with known ovarian cancer, as above. Suspected involvement of the sigmoid colon. No evidence of bowel obstruction. Improving peritoneal nodularity, as above. Additional ancillary findings as above. Electronically Signed   By: Charline Bills M.D.   On: 08/04/2022 01:27      08/16/2022 Pathology Results   CASE: [id number] PATIENT: Donnamae Jude Surgical Pathology Report  Clinical History: Advanced gyn  malignancy (crm)  FINAL MICROSCOPIC DIAGNOSIS:  A. OMENTUM, RESECTION: - Positive for carcinoma  B. LIVER ADHESION, EXCISION: - Benign fibroadipose tissue - Negative for carcinoma  C. SMALL BOWEL MESENTERIC IMPLANT, EXCISION: - Positive for carcinoma with extensive necrosis  D. UTERUS, CERVIX, BILATERAL FALLOPIAN TUBES AND OVARIES, AND APPENDIX, RESECTION: - Uterine serosa: Tumor deposits present - Cervix: Benign, nabothian cyst - Endometrium: Benign inactive endometrium, benign endometrial polyp - Myometrium: Leiomyoma - Right fallopian tube: Metastatic tumor deposits present - Right ovary: High-grade serous carcinoma - Left ovary: High-grade serous carcinoma - Benign appendix - See oncology table  E. COLON, RECTOSIGMOID, RESECTION: - Benign colonic mucosa - Margins free of carcinoma - Adherent fallopian tube with metastatic tumor deposits - 2 benign lymph nodes  F. RECTAL TUMOR IMPLANT, EXCISION: - Metastatic high-grade serous carcinoma   G. COLON, RECTOSIGMOID ANASTOMOTIC RINGS: - Benign anastomotic donuts  ONCOLOGY TABLE:   OVARY or FALLOPIAN TUBE or PRIMARY PERITONEUM: Resection  Procedure: Total abdominal hysterectomy, bilateral salpingo-oophorectomy, appendectomy, rectosigmoid resection, total omentectomy, small bowel nodule resection Specimen Integrity: Intact Tumor Site: Bilateral Tumor Size: Right ovary: 3.9 cm, left ovary: Approximately 1.2 cm Histologic Type: High-grade serous carcinoma Histologic Grade: High-grade Ovarian Surface Involvement: Present Fallopian Tube Surface Involvement: Present Implants: Present, small bowel mesenteric and rectovaginal septum Lymphatic and/or Vascular Invasion: Not identified Other Tissue/ Organ Involvement: Fallopian tube, bilateral Largest Extrapelvic Peritoneal Focus: 1.4 cm, small bowel mesentery Peritoneal/Ascitic Fluid Involvement: Not applicable Chemotherapy Response Score (CRS): Not applicable, no known  presurgical therapy Regional Lymph Nodes: Not applicable (no lymph nodes submitted or found) Distant Metastasis:      Distant Site(s) Involved: Not applicable Pathologic Stage Classification (pTNM, AJCC 8th Edition): pT3b, pN[not assigned] Ancillary Studies: Can be performed upon request Representative Tumor Block: D8 Comment(s): Tumor is staged as pT3 given the presence of extrapelvic peritoneal deposits on small bowel mesentery less than 2cm (1.4 cm) ([id number])    09/18/2022 Tumor Marker   Patient's tumor was tested for the following markers: CA-125. Results of the tumor marker test revealed 36.8.   10/15/2022 Genetic Testing   Negative [facility] Multi-Cancer +RNA Panel.  VUS in POLD1 at c.1322C>T (p.Thr441Met). Report date is 10/15/2022.   The Multi-Cancer + RNA Panel offered by [facility] includes sequencing and/or deletion/duplication analysis of the following 70 genes:  AIP*, ALK, APC*, ATM*, AXIN2*, BAP1*, BARD1*, BLM*, BMPR1A*, BRCA1*, BRCA2*, BRIP1*, CDC73*, CDH1*, CDK4, CDKN1B*, CDKN2A, CHEK2*, CTNNA1*, DICER1*, EPCAM (del/dup only), EGFR, FH*, FLCN*, GREM1 (promoter dup only), HOXB13, KIT, LZTR1, MAX*, MBD4, MEN1*, MET, MITF, MLH1*, MSH2*, MSH3*, MSH6*, MUTYH*, NF1*, NF2*, NTHL1*, PALB2*, PDGFRA, PMS2*, POLD1*, POLE*, POT1*, PRKAR1A*, PTCH1*, PTEN*, RAD51C*, RAD51D*, RB1*, RET, SDHA* (sequencing only), SDHAF2*, SDHB*, SDHC*, SDHD*, SMAD4*, SMARCA4*, SMARCB1*, SMARCE1*, STK11*, SUFU*, TMEM127*, TP53*, TSC1*, TSC2*, VHL*. RNA analysis is performed for * genes.   11/06/2022 Tumor Marker   Patient's tumor was tested for the following markers: CA-125. Results of the tumor marker test revealed 16.8.   12/11/2022 Tumor Marker   Patient's tumor was tested for the following markers: CA-125. Results of the tumor marker test revealed 12.5.   12/14/2022 Imaging   CT ABDOMEN PELVIS W CONTRAST  Result Date: 12/13/2022 CLINICAL DATA:  Ovarian cancer restaging * Tracking Code: BO * EXAM: CT ABDOMEN AND  PELVIS WITH CONTRAST TECHNIQUE: Multidetector CT imaging of the abdomen and pelvis was performed using the standard protocol following bolus administration of intravenous contrast. RADIATION DOSE REDUCTION: This exam was performed according to the departmental dose-optimization program which includes automated exposure control, adjustment of the mA and/or kV according to patient size and/or use of iterative reconstruction technique. CONTRAST:  OMNIPAQUE IOHEXOL 300 MG/ML  SOLN COMPARISON:  08/02/2022 FINDINGS: Lower chest: Unremarkable Hepatobiliary: Unremarkable Pancreas: Unremarkable Spleen: Unremarkable Adrenals/Urinary Tract: 2.6 by 2.1 cm right adrenal mass, internal density 55 Hounsfield units on portal venous phase images, relative washout of 13% which is indeterminate. Back in 2016 I measure this lesion at 2.2 by 1.6 cm, on the more recent comparison of 07/18/2022 this lesion measured 2.6 by 2.1 cm similar to today. Accordingly this lesion is stable from March and only very minimally increased in size from 2016, most likely benign. This can likely be surveilled in the context of the patient's ongoing oncology CT examinations. 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 The ureters transition to relatively normal caliber in the vicinity of the iliac vessel cross  over, a specific cause for the transition is not identified. There is also a duplicated left renal collecting system without hydroureter on the left Stable 7 mm calcification or stone in the vicinity of the left ureterovesical junction. Stomach/Bowel: Prominent stool throughout the colon favors constipation. Anastomotic staple line in the sigmoid colon. No dilated small bowel. Interval appendectomy. Previous nodularity along bowel including the sigmoid colon no longer identified. Vascular/Lymphatic: Atherosclerosis is present,  including aortoiliac atherosclerotic disease. No pathologic adenopathy. Reproductive: Interval hysterectomy with salpingo-oophorectomy. Other: Interval omentectomy. No current compelling findings of peritoneal tumor deposits. No ascites. Musculoskeletal: Prior fusion at L4-L5-S1. IMPRESSION: 1. Interval hysterectomy, omentectomy, and appendectomy. No compelling findings of recurrent or residual peritoneal tumor deposits. 2. 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 A specific cause for the transition in caliber is not identified. 3. Stable 7 mm calcification or stone in the vicinity of the left ureterovesical junction. Duplicated left renal collecting system. 4. Prominent stool throughout the colon favors constipation. 5. Chronically stable but otherwise nonspecific right adrenal mass, likely benign. 6. Aortic atherosclerosis. Aortic Atherosclerosis (ICD10-I70.0). Electronically Signed   By: Gaylyn Rong M.D.   On: 12/13/2022 15:16        PHYSICAL EXAMINATION: ECOG PERFORMANCE STATUS: 1 - Symptomatic but completely ambulatory  Vitals:   12/14/22 0825  BP: (!) 169/90  Pulse: 81  Resp: 18  Temp: 98.6 F (37 C)  SpO2: 100%   Filed Weights   12/14/22 0825  Weight: 180 lb 6.4 oz (81.8 kg)    GENERAL:alert, no distress and comfortable NEURO: alert & oriented x 3 with fluent speech, no focal motor/sensory deficits  LABORATORY DATA:  I have reviewed the data as listed    Component Value Date/Time   NA 140 12/08/2022 0822   K 3.5 12/08/2022 0822   CL 107 12/08/2022 0822   CO2 27 12/08/2022 0822   GLUCOSE 105 (H) 12/08/2022 0822   BUN 25 (H) 12/08/2022 0822   CREATININE 1.01 (H) 12/08/2022 0822   CALCIUM 9.1 12/08/2022 0822   PROT 6.9 12/08/2022 0822   ALBUMIN 4.2 12/08/2022 0822   AST 18 12/08/2022 0822   ALT 22 12/08/2022  0822   ALKPHOS 79 12/08/2022 0822   BILITOT 0.3 12/08/2022 0822   GFRNONAA >60 12/08/2022 0822   GFRAA >60 08/20/2019 1408    No results found for: "SPEP", "UPEP"  Lab Results  Component Value Date   WBC 4.3 12/08/2022   NEUTROABS 1.6 (L) 12/08/2022   HGB 12.0 12/08/2022   HCT 34.3 (L) 12/08/2022   MCV 101.8 (H) 12/08/2022   PLT 211 12/08/2022      Chemistry      Component Value Date/Time   NA 140 12/08/2022 0822   K 3.5 12/08/2022 0822   CL 107 12/08/2022 0822   CO2 27 12/08/2022 0822   BUN 25 (H) 12/08/2022 0822   CREATININE 1.01 (H) 12/08/2022 0822      Component Value Date/Time   CALCIUM 9.1 12/08/2022 0822   ALKPHOS 79 12/08/2022 0822   AST 18 12/08/2022 0822   ALT 22 12/08/2022 0822   BILITOT 0.3 12/08/2022 6045       RADIOGRAPHIC STUDIES: I reviewed multiple imaging studies with the patient I have personally reviewed the radiological images as listed and agreed with the findings in the report. CT ABDOMEN PELVIS W CONTRAST  Result Date:  12/13/2022 CLINICAL DATA:  Ovarian cancer restaging * Tracking Code: BO * EXAM: CT ABDOMEN AND PELVIS WITH CONTRAST TECHNIQUE: Multidetector CT imaging of the abdomen and pelvis was performed using the standard protocol following bolus administration of intravenous contrast. RADIATION DOSE REDUCTION: This exam was performed according to the departmental dose-optimization program which includes automated exposure control, adjustment of the mA and/or kV according to patient size and/or use of iterative reconstruction technique. CONTRAST:  OMNIPAQUE IOHEXOL 300 MG/ML  SOLN COMPARISON:  08/02/2022 FINDINGS: Lower chest: Unremarkable Hepatobiliary: Unremarkable Pancreas: Unremarkable Spleen: Unremarkable Adrenals/Urinary Tract: 2.6 by 2.1 cm right adrenal mass, internal density 55 Hounsfield units on portal venous phase images, relative washout of 13% which is indeterminate. Back in 2016 I measure this lesion at 2.2 by 1.6 cm, on the  more recent comparison of 07/18/2022 this lesion measured 2.6 by 2.1 cm similar to today. Accordingly this lesion is stable from March and only very minimally increased in size from 2016, most likely benign. This can likely be surveilled in the context of the patient's ongoing oncology CT examinations. 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 The ureters transition to relatively normal caliber in the vicinity of the iliac vessel cross over, a specific cause for the transition is not identified. There is also a duplicated left renal collecting system without hydroureter on the left Stable 7 mm calcification or stone in the vicinity of the left ureterovesical junction. Stomach/Bowel: Prominent stool throughout the colon favors constipation. Anastomotic staple line in the sigmoid colon. No dilated small bowel. Interval appendectomy. Previous nodularity along bowel including the sigmoid colon no longer identified. Vascular/Lymphatic: Atherosclerosis is present, including aortoiliac atherosclerotic disease. No pathologic adenopathy. Reproductive: Interval hysterectomy with salpingo-oophorectomy. Other: Interval omentectomy. No current compelling findings of peritoneal tumor deposits. No ascites. Musculoskeletal: Prior fusion at L4-L5-S1. IMPRESSION: 1. Interval hysterectomy, omentectomy, and appendectomy. No compelling findings of recurrent or residual peritoneal tumor deposits. 2. 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 A specific cause for the transition in caliber is not identified. 3. Stable 7 mm calcification or stone in the vicinity of the left ureterovesical junction. Duplicated left renal collecting system. 4. Prominent stool throughout the  colon favors constipation. 5. Chronically stable but otherwise nonspecific right adrenal mass, likely benign. 6. Aortic atherosclerosis. Aortic Atherosclerosis (ICD10-I70.0). Electronically Signed   By: Gaylyn Rong M.D.   On: 12/13/2022 15:16

## 2022-12-14 NOTE — Assessment & Plan Note (Signed)
This is worse since completion of treatment Anticipate improvement with time away from chemotherapy We discussed risk and benefits of trial of Lyrica and she is in agreement to proceed I plan to call her in about 2 weeks to assess response to treatment

## 2022-12-14 NOTE — Assessment & Plan Note (Signed)
She completed her treatment, complicated by persistent neuropathy We discussed future follow-up The patient wants a port to be removed She will be seen by GYN surgeon in 3 months and I will see her in 6 months with repeat imaging study We discussed treatment options for neuropathy

## 2022-12-14 NOTE — Assessment & Plan Note (Signed)
We discussed importance of aggressive laxative therapy 

## 2022-12-20 ENCOUNTER — Ambulatory Visit (HOSPITAL_COMMUNITY)
Admission: RE | Admit: 2022-12-20 | Discharge: 2022-12-20 | Disposition: A | Discharge status: Home / Self Care | Payer: 59 | Source: Ambulatory Visit | Attending: Hematology and Oncology | Admitting: Hematology and Oncology

## 2022-12-20 DIAGNOSIS — C563 Malignant neoplasm of bilateral ovaries: Secondary | ICD-10-CM | POA: Insufficient documentation

## 2022-12-20 DIAGNOSIS — Z452 Encounter for adjustment and management of vascular access device: Secondary | ICD-10-CM | POA: Diagnosis not present

## 2022-12-20 HISTORY — PX: IR REMOVAL TUN ACCESS W/ PORT W/O FL MOD SED: IMG2290

## 2022-12-20 MED ORDER — LIDOCAINE-EPINEPHRINE 1 %-1:100000 IJ SOLN
INTRAMUSCULAR | Status: AC
  Filled 2022-12-20: qty 1

## 2022-12-20 MED ORDER — LIDOCAINE-EPINEPHRINE 1 %-1:100000 IJ SOLN
20.0000 mL | Freq: Once | INTRAMUSCULAR | Status: AC
  Administered 2022-12-20: 10 mL via INTRADERMAL

## 2022-12-20 NOTE — Procedures (Signed)
Vascular and Interventional Radiology Procedure Note  Patient: [name] DOB: [birth year] Medical Record Number: [id number] Note Date/Time: 12/20/22 9:46 AM   Performing Physician: [name], MD Assistant(s): None  Diagnosis: Ovarian cancer  Procedure: PORT REMOVAL  Anesthesia: Local Anesthetic Complications: None Estimated Blood Loss: Minimal Specimens:  None  Findings:  Successful removal of a right-sided venous port. Primary incision closure. Dermabond at skin.  See detailed procedure note with images in PACS. The patient tolerated the procedure well without incident or complication and was returned to Recovery in stable condition.    [name], MD [facility] Radiology   Pager. [phone] Clinic. [phone]

## 2022-12-20 NOTE — Progress Notes (Signed)
Vascular and Interventional Radiology  PRE PROCEDURE H&P  Assessment  Plan:   [name] is a 67 y.o. year old female who will undergo PORT CATHETER REMOVAL in Interventional Radiology.  The procedure has been fully reviewed with the patient/patient's authorized representative. The risks, benefits and alternatives have been explained, and the patient/patient's authorized representative has consented to the procedure.   HPI: [name] is a 67 y.o. year old female w PMHx significant for ovarian CA s/p VIR R chest port in 05/18/22 [name]). Pt represents after completion of therapy and recommended for removal. Reports no concern with the port, other than some slight pulling at her shoulder. Port skin site c/d/I.   Review of Systems: A 12-point ROS discussed, and pertinent positives are indicated in the HPI above.  All other systems are negative.  Informed consent was obtained, witnessed and placed in the patient's chart.  Allergies:  Allergies  Allergen Reactions   Cymbalta [Duloxetine Hcl] Shortness Of Breath, Swelling and Other (See Comments)    Tongue and leg swelling   Cyclobenzaprine Hcl Other (See Comments)    Immobility   Celexa  [Citalopram Hydrobromide] Swelling and Other (See Comments)    Tongue swelling   Fluticasone Furoate-Vilanterol Swelling and Other (See Comments)    BREO ELLIPTA- Tongue swelling, but breathing not affected   Gabapentin Other (See Comments)    Feels awful when taking, drowsy    Opana  [Oxymorphone Hcl] Itching and Other (See Comments)    Loss of hair, also   Tramadol Other (See Comments)    Dizziness     Medications:  Current Outpatient Medications on File Prior to Encounter  Medication Sig Dispense Refill   albuterol (VENTOLIN HFA) 108 (90 Base) MCG/ACT inhaler Inhale 2 puffs into the lungs every 6 (six) hours as needed for shortness of breath.     amphetamine-dextroamphetamine (ADDERALL) 20 MG tablet Take 10-20 mg by mouth  See admin instructions. Take 20 mg by mouth in the morning and 10 mg between 1-2 PM daily     Ascorbic Acid (VITAMIN C) 1000 MG tablet Take 1,000 mg by mouth daily.     ASHWAGANDHA PO Take 1 capsule by mouth daily.     BLACK CURRANT SEED OIL PO Take 1 capsule by mouth daily.     diclofenac Sodium (VOLTAREN) 1 % GEL Apply 2 g topically 2 (two) times daily.     LORazepam (ATIVAN) 0.5 MG tablet Take 0.5 mg by mouth 3 (three) times daily as needed for anxiety.     Melatonin 10 MG TABS Take 10 mg by mouth at bedtime as needed (sleep).     Multiple Vitamin (MULTIVITAMIN WITH MINERALS) TABS tablet Take 1 tablet by mouth daily.     mupirocin ointment (BACTROBAN) 2 % Apply 1 Application topically 3 (three) times daily as needed (HS flares).     naloxone (NARCAN) nasal spray 4 mg/0.1 mL Place 1 spray into the nose as needed (opioid overdose).     OVER THE COUNTER MEDICATION Take 2 capsules by mouth daily. Sea Moss advanced supplement     oxyCODONE ER (XTAMPZA ER) 13.5 MG C12A Take 13.5 mg by mouth in the morning, at noon, and at bedtime.     Oxycodone HCl 20 MG TABS Take 20 mg by mouth 5 (five) times daily as needed (pain).     polyethylene glycol (MIRALAX / GLYCOLAX) 17 g packet Take 17 g by mouth daily.     pregabalin (LYRICA) 25  MG capsule Take 1 capsule (25 mg total) by mouth daily. 30 capsule 1   Vitamin D, Ergocalciferol, (DRISDOL) 1.25 MG (50000 UNIT) CAPS capsule Take 50,000 Units by mouth every Sunday.     WIXELA INHUB 250-50 MCG/ACT AEPB Inhale 1 puff into the lungs daily.     No current facility-administered medications on file prior to encounter.    PSH:  Past Surgical History:  Procedure Laterality Date   ANGIOPLASTY     BACK SURGERY     lumbar surgery x 2 done in [place] and [place]   COLON RESECTION SIGMOID  08/16/2022   Procedure: COLON RESECTION SIGMOID;  Surgeon: Carver Fila, MD;  Location: [facility];  Service: Gynecology;;   COLONOSCOPY  07/2010   tubular adenoma    COLONOSCOPY  08/2015   CYSTOSCOPY N/A 08/16/2022   Procedure: CYSTOSCOPY;  Surgeon: Carver Fila, MD;  Location: [facility];  Service: Gynecology;  Laterality: N/A;   DISKECTOMY     HYSTERECTOMY ABDOMINAL WITH SALPINGO-OOPHORECTOMY  08/16/2022   Procedure: HYSTERECTOMY ABDOMINAL WITH SALPINGO-OOPHORECTOMY;  Surgeon: Carver Fila, MD;  Location: [facility];  Service: Gynecology;;   IR IMAGING GUIDED PORT INSERTION  05/18/2022   IR US GUIDE BX ASP/DRAIN  05/18/2022   LAPAROSCOPY N/A 08/16/2022   Procedure: LAPAROSCOPY DIAGNOSTIC;  Surgeon: Carver Fila, MD;  Location: [facility];  Service: Gynecology;  Laterality: N/A;   laparoscopy for endometriosis     left shoulder scope     RADIOLOGY WITH ANESTHESIA N/A 05/18/2022   Procedure: IR WITH ANESTHESIA PORT AND BIOPSY;  Surgeon: Irish Lack, MD;  Location: [facility];  Service: Radiology;  Laterality: N/A;   ROTATOR CUFF REPAIR Left    Spinal Fusion  12/2016   L4-S1    PMH:  Past Medical History:  Diagnosis Date   Anxiety    Asthma    Back problem    disc disease, chronic low back pain, right leg pain, s/p discectomy 99   Cancer (HCC)    ovarian cancer   Depression    Diastolic dysfunction    with elevated LVEDP at cath   Endometriosis    Fibroids    hx of    GERD (gastroesophageal reflux disease)    Heart murmur    Hepatitis C    treated 8-9 years ago   Hidradenitis suppurativa    History of kidney stones    Hypertension    MS (multiple sclerosis) (HCC)    Neuromuscular disorder (HCC)    Mulitple sclerosis   Osteoarthritis    Pre-diabetes    Prediabetes    RA (rheumatoid arthritis) (HCC)    Rotator cuff tear    right shoulder    Syncope     Brief Physical Examination: There were no vitals filed for this visit. General: WD, WN female in NAD HEENT: Normocephalic, atraumatic Lungs: Respirations non-labored   Yolanda Banning, MD [facility]iology [facility] Radiology   Pager.  [phone] Clinic. [phone]

## 2022-12-28 ENCOUNTER — Telehealth: Payer: Self-pay

## 2022-12-28 NOTE — Telephone Encounter (Signed)
She called back to give update. She stopped the Lyrica after 3 days due excessive sleepiness, her head not feeling okay and it almost makes her nerves feel numb. She is willing to try something else for the neuropathy.  She is going to see ortho for some type of injection for arthritis pain. FYI

## 2022-12-28 NOTE — Telephone Encounter (Signed)
Called and left below message. Ask her to call the office back. 

## 2022-12-28 NOTE — Telephone Encounter (Signed)
Pls delete lyrica off her list

## 2022-12-28 NOTE — Telephone Encounter (Signed)
-----   Message from Artis Delay sent at 12/28/2022  9:15 AM EDT ----- Can you call her? I prescribed lyrica for her recently Is that helpful? Does she need to continue same dose or higher dose?

## 2023-01-01 DIAGNOSIS — M25512 Pain in left shoulder: Secondary | ICD-10-CM | POA: Diagnosis not present

## 2023-01-01 DIAGNOSIS — M25511 Pain in right shoulder: Secondary | ICD-10-CM | POA: Diagnosis not present

## 2023-01-03 DIAGNOSIS — M25511 Pain in right shoulder: Secondary | ICD-10-CM | POA: Diagnosis not present

## 2023-01-03 DIAGNOSIS — M5412 Radiculopathy, cervical region: Secondary | ICD-10-CM | POA: Diagnosis not present

## 2023-01-03 DIAGNOSIS — M6281 Muscle weakness (generalized): Secondary | ICD-10-CM | POA: Diagnosis not present

## 2023-01-03 DIAGNOSIS — M25512 Pain in left shoulder: Secondary | ICD-10-CM | POA: Diagnosis not present

## 2023-01-09 DIAGNOSIS — M792 Neuralgia and neuritis, unspecified: Secondary | ICD-10-CM | POA: Diagnosis not present

## 2023-01-09 DIAGNOSIS — M62838 Other muscle spasm: Secondary | ICD-10-CM | POA: Diagnosis not present

## 2023-01-09 DIAGNOSIS — G35 Multiple sclerosis: Secondary | ICD-10-CM | POA: Diagnosis not present

## 2023-01-18 DIAGNOSIS — M25512 Pain in left shoulder: Secondary | ICD-10-CM | POA: Diagnosis not present

## 2023-01-18 DIAGNOSIS — M5412 Radiculopathy, cervical region: Secondary | ICD-10-CM | POA: Diagnosis not present

## 2023-01-18 DIAGNOSIS — M25511 Pain in right shoulder: Secondary | ICD-10-CM | POA: Diagnosis not present

## 2023-01-18 DIAGNOSIS — M6281 Muscle weakness (generalized): Secondary | ICD-10-CM | POA: Diagnosis not present

## 2023-01-25 DIAGNOSIS — M6281 Muscle weakness (generalized): Secondary | ICD-10-CM | POA: Diagnosis not present

## 2023-01-25 DIAGNOSIS — M25512 Pain in left shoulder: Secondary | ICD-10-CM | POA: Diagnosis not present

## 2023-01-25 DIAGNOSIS — M5412 Radiculopathy, cervical region: Secondary | ICD-10-CM | POA: Diagnosis not present

## 2023-01-25 DIAGNOSIS — M25511 Pain in right shoulder: Secondary | ICD-10-CM | POA: Diagnosis not present

## 2023-01-29 DIAGNOSIS — M25511 Pain in right shoulder: Secondary | ICD-10-CM | POA: Diagnosis not present

## 2023-01-29 DIAGNOSIS — M6281 Muscle weakness (generalized): Secondary | ICD-10-CM | POA: Diagnosis not present

## 2023-01-29 DIAGNOSIS — M25512 Pain in left shoulder: Secondary | ICD-10-CM | POA: Diagnosis not present

## 2023-01-29 DIAGNOSIS — M5412 Radiculopathy, cervical region: Secondary | ICD-10-CM | POA: Diagnosis not present

## 2023-02-09 DIAGNOSIS — M25511 Pain in right shoulder: Secondary | ICD-10-CM | POA: Diagnosis not present

## 2023-02-09 DIAGNOSIS — M6281 Muscle weakness (generalized): Secondary | ICD-10-CM | POA: Diagnosis not present

## 2023-02-09 DIAGNOSIS — M5412 Radiculopathy, cervical region: Secondary | ICD-10-CM | POA: Diagnosis not present

## 2023-02-09 DIAGNOSIS — M25512 Pain in left shoulder: Secondary | ICD-10-CM | POA: Diagnosis not present

## 2023-02-12 ENCOUNTER — Encounter: Payer: Self-pay | Admitting: Gynecologic Oncology

## 2023-02-12 DIAGNOSIS — M6281 Muscle weakness (generalized): Secondary | ICD-10-CM | POA: Diagnosis not present

## 2023-02-12 DIAGNOSIS — M25512 Pain in left shoulder: Secondary | ICD-10-CM | POA: Diagnosis not present

## 2023-02-12 DIAGNOSIS — M5412 Radiculopathy, cervical region: Secondary | ICD-10-CM | POA: Diagnosis not present

## 2023-02-12 DIAGNOSIS — M25511 Pain in right shoulder: Secondary | ICD-10-CM | POA: Diagnosis not present

## 2023-02-15 ENCOUNTER — Inpatient Hospital Stay: Payer: 59 | Admitting: Gynecologic Oncology

## 2023-02-15 ENCOUNTER — Telehealth: Payer: Self-pay

## 2023-02-15 DIAGNOSIS — C563 Malignant neoplasm of bilateral ovaries: Secondary | ICD-10-CM

## 2023-02-15 NOTE — Telephone Encounter (Signed)
Pt called this morning stating she is sick and needs to reschedule her appointment with Dr.Tucker today.   Pt is rescheduled to 11/8 @ 4:15. She agrees to date/time

## 2023-02-21 ENCOUNTER — Other Ambulatory Visit: Payer: Self-pay | Admitting: Family Medicine

## 2023-02-21 DIAGNOSIS — Z1231 Encounter for screening mammogram for malignant neoplasm of breast: Secondary | ICD-10-CM

## 2023-02-22 DIAGNOSIS — J4521 Mild intermittent asthma with (acute) exacerbation: Secondary | ICD-10-CM | POA: Diagnosis not present

## 2023-03-14 DIAGNOSIS — M25511 Pain in right shoulder: Secondary | ICD-10-CM | POA: Diagnosis not present

## 2023-03-14 DIAGNOSIS — M19012 Primary osteoarthritis, left shoulder: Secondary | ICD-10-CM | POA: Diagnosis not present

## 2023-03-16 ENCOUNTER — Encounter: Payer: Self-pay | Admitting: Gynecologic Oncology

## 2023-03-16 ENCOUNTER — Inpatient Hospital Stay: Payer: 59 | Attending: Gynecologic Oncology | Admitting: Gynecologic Oncology

## 2023-03-16 ENCOUNTER — Inpatient Hospital Stay: Payer: 59

## 2023-03-16 VITALS — BP 156/84 | HR 84 | Temp 98.7°F | Resp 20 | Wt 182.6 lb

## 2023-03-16 DIAGNOSIS — Z08 Encounter for follow-up examination after completed treatment for malignant neoplasm: Secondary | ICD-10-CM | POA: Diagnosis not present

## 2023-03-16 DIAGNOSIS — Z8543 Personal history of malignant neoplasm of ovary: Secondary | ICD-10-CM

## 2023-03-16 DIAGNOSIS — C563 Malignant neoplasm of bilateral ovaries: Secondary | ICD-10-CM

## 2023-03-16 DIAGNOSIS — Z9221 Personal history of antineoplastic chemotherapy: Secondary | ICD-10-CM | POA: Diagnosis not present

## 2023-03-16 DIAGNOSIS — Z90710 Acquired absence of both cervix and uterus: Secondary | ICD-10-CM | POA: Insufficient documentation

## 2023-03-16 DIAGNOSIS — Z90722 Acquired absence of ovaries, bilateral: Secondary | ICD-10-CM | POA: Insufficient documentation

## 2023-03-16 LAB — CBC WITH DIFFERENTIAL/PLATELET
Abs Immature Granulocytes: 0.02 10*3/uL (ref 0.00–0.07)
Basophils Absolute: 0 10*3/uL (ref 0.0–0.1)
Basophils Relative: 1 %
Eosinophils Absolute: 0.1 10*3/uL (ref 0.0–0.5)
Eosinophils Relative: 1 %
HCT: 41.8 % (ref 36.0–46.0)
Hemoglobin: 15.4 g/dL — ABNORMAL HIGH (ref 12.0–15.0)
Immature Granulocytes: 0 %
Lymphocytes Relative: 34 %
Lymphs Abs: 1.5 10*3/uL (ref 0.7–4.0)
MCH: 36.8 pg — ABNORMAL HIGH (ref 26.0–34.0)
MCHC: 36.8 g/dL — ABNORMAL HIGH (ref 30.0–36.0)
MCV: 99.8 fL (ref 80.0–100.0)
Monocytes Absolute: 0.6 10*3/uL (ref 0.1–1.0)
Monocytes Relative: 14 %
Neutro Abs: 2.3 10*3/uL (ref 1.7–7.7)
Neutrophils Relative %: 50 %
Platelets: 279 10*3/uL (ref 150–400)
RBC: 4.19 MIL/uL (ref 3.87–5.11)
RDW: 11.2 % — ABNORMAL LOW (ref 11.5–15.5)
WBC: 4.5 10*3/uL (ref 4.0–10.5)
nRBC: 0 % (ref 0.0–0.2)

## 2023-03-16 LAB — COMPREHENSIVE METABOLIC PANEL
ALT: 22 U/L (ref 0–44)
AST: 19 U/L (ref 15–41)
Albumin: 4.3 g/dL (ref 3.5–5.0)
Alkaline Phosphatase: 87 U/L (ref 38–126)
Anion gap: 6 (ref 5–15)
BUN: 14 mg/dL (ref 8–23)
CO2: 27 mmol/L (ref 22–32)
Calcium: 9.7 mg/dL (ref 8.9–10.3)
Chloride: 107 mmol/L (ref 98–111)
Creatinine, Ser: 0.88 mg/dL (ref 0.44–1.00)
GFR, Estimated: >60 mL/min (ref >60)
Glucose, Bld: 115 mg/dL — ABNORMAL HIGH (ref 70–99)
Potassium: 4.2 mmol/L (ref 3.5–5.1)
Sodium: 140 mmol/L (ref 135–145)
Total Bilirubin: 0.4 mg/dL (ref <1.2)
Total Protein: 7.4 g/dL (ref 6.5–8.1)

## 2023-03-16 NOTE — Progress Notes (Signed)
Gynecologic Oncology Return Clinic Visit  03/16/23  Reason for Visit: follow-up  Treatment History: Oncology History Overview Note  High grade serous, p53 mutated Neg genetics, HRD not detected, MSI stable, low TMB 2   Bilateral primary ovarian cancer (HCC)  04/11/2022 Imaging   CT of the abdomen and pelvis on 04/11/2022 reveals a left adnexal masslike area measuring 5.9 x 3.6 cm.  Margins of this mass are irregular.  There is small fluid in the cul-de-sac as well as fullness of soft tissue about the right adnexa.  Discrete uterine structure is not identified.  Left upper quadrant nodules beneath the left hemidiaphragm (1 anterior to the spleen and the other to the stomach).  Omental/peritoneal nodularity and multiple perihepatic nodules are noted    04/18/2022 Tumor Marker   Patient's tumor was tested for the following markers: CA-125. Results of the tumor marker test revealed 123.   04/19/2022 Initial Diagnosis   Carcinomatosis (HCC)   04/28/2022 Imaging   1. Bulky bilateral ovarian masses and nodules, left-greater-than-right, which are essentially confluent with adjacent pelvic soft tissue nodularity and not significantly changed compared to prior examination dated 04/11/2022. 2. Extensive pelvic peritoneal thickening and nodularity. Multiple small peritoneal nodules throughout the abdomen and pelvis. Findings are consistent with peritoneal metastatic disease and likewise not significantly changed. 3. Small volume of loculated appearing fluid in the low pelvis. 4. 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 The mid to distal right ureter is obstructed by right ovarian mass and or soft tissue nodularity. 5. Calculus within the most distal left ureterovesicular junction or just within the bladder lumen measuring 0.7 cm, unchanged. No associated left-sided hydronephrosis. 6. Soft tissue  attenuation nodule of the body of the right adrenal gland. Notably, this was also soft tissue attenuation on prior noncontrast examination (i.e. not definitively macroscopic fat containing) although unchanged compared to prior examinations dating back to 2018 and almost certainly a benign adenoma. Attention on follow-up. 7. No evidence of metastatic disease in the chest. 8. Mild diffuse bilateral bronchial wall thickening. Background of very fine centrilobular nodularity, most concentrated in the lung apices. Findings are most consistent with smoking-related respiratory bronchiolitis.     05/18/2022 Procedure   Ultrasound-guided core biopsy performed of a 10 mm soft tissue peritoneal mass in the right upper quadrant just superficial to the right lobe of the liver. The procedure was performed under general anesthesia immediately following Port-A-Cath placement.   05/18/2022 Procedure   Placement of single lumen port a cath via right internal jugular vein. The catheter tip lies at the cavo-atrial junction. A power injectable port a cath was placed and is ready for immediate use.     05/18/2022 Pathology Results   A. 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  The findings support the above interpretation.  This case was reviewed with Dr. Kenard Gower who agrees with the above diagnosis.  A p16 stain is pending and will be reported in an addendum.    05/23/2022 Cancer Staging   Staging form: Ovary, Fallopian Tube, and Primary Peritoneal Carcinoma, AJCC 8th Edition - Clinical stage from 05/23/2022: FIGO Stage IIIC (cT3c, cN0, cM0) - Signed by Artis Delay, MD on 05/23/2022 Stage prefix: Initial diagnosis   05/29/2022 Imaging   1. Complex bilateral adnexal masses, as described above, consistent  with the patient's known malignancy. 2. Subsequent  marked severity mass effect on the distal bilateral common iliac veins, increased in severity when compared to the prior exam. 3. Additional findings consistent with peritoneal metastasis within the pelvis. 4. Findings consistent with pelvic congestion syndrome. 5. Postoperative changes within the mid and lower lumbar spine. 6. Aortic atherosclerosis. 7. No evidence of venous thrombus within the abdomen, pelvis or proximal bilateral lower extremities.   Aortic Atherosclerosis (ICD10-I70.0).   06/02/2022 - 11/03/2022 Chemotherapy   Patient is on Treatment Plan : OVARIAN Carboplatin (AUC 6) + Paclitaxel (175) q21d X 6 Cycles     06/26/2022 Tumor Marker   Patient's tumor was tested for the following markers: CA-125. Results of the tumor marker test revealed 134.   08/04/2022 Imaging   CT ABDOMEN PELVIS W CONTRAST  Result Date: 08/04/2022 CLINICAL DATA:  Ovarian cancer, chemotherapy in progress, for restaging EXAM: CT ABDOMEN AND PELVIS WITH CONTRAST TECHNIQUE: Multidetector CT imaging of the abdomen and pelvis was performed using the standard protocol following bolus administration of intravenous contrast. RADIATION DOSE REDUCTION: This exam was performed according to the departmental dose-optimization program which includes automated exposure control, adjustment of the mA and/or kV according to patient size and/or use of iterative reconstruction technique. CONTRAST:  OMNIPAQUE IOHEXOL 300 MG/ML  SOLN COMPARISON:  CT venogram abdomen/pelvis dated 05/29/2022. CT abdomen/pelvis dated 04/26/2022. FINDINGS: Lower chest: Lung bases are clear. Hepatobiliary: Liver is within normal limits. Gallbladder is unremarkable. No intrahepatic or extrahepatic ductal dilatation. Pancreas: Within normal limits. Spleen: Within normal limits. Adrenals/Urinary Tract: 2.4 cm right adrenal nodule (series 2/image 17), previously 2.0 cm in 2018, likely reflecting a benign adrenal adenoma. Left adrenal gland is within normal  limits. Left kidney is within normal limits. 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 Associated extrinsic compression at the level of the right adnexal mass (described below). Mildly thick-walled bladder, although underdistended. Stable 6 mm left UVJ calcification (series 2/image 68). Stomach/Bowel: Stomach is within normal limits. No evidence of bowel obstruction. Normal appendix (series 2/image 47). Left adnexal mass (described below) is favored to abut and directly invade the left lateral wall of the sigmoid colon (series 2/image 57). Vascular/Lymphatic: No evidence of abdominal aortic aneurysm. Atherosclerotic calcifications of the abdominal aorta and branch vessels. No suspicious abdominopelvic lymphadenopathy. Reproductive: Heterogeneous uterus with associated surface nodularity (series 2/image 65), likely reflecting peritoneal disease. This appearance is improved from the prior. 3.9 x 3.3 cm left adnexal mass (series 2/image 60), previously 5.3 x 3.9 cm, with extension along the sigmoid mesocolon (series 2/image 56) and suspected involvement of the sigmoid colon (series 2/image 57). 5.6 x 2.7 cm mixed cystic/solid right adnexal mass (series 2/image 89), previously 6.9 x 4.3 cm when measured in a similar fashion. Other: No abdominopelvic ascites. Scattered minimal peritoneal nodularity beneath the anterior abdominal wall, including a 5 mm nodule beneath the left anterior abdominal wall (series 2/image 27) which previously measured 12 mm. Additional mild nodularity on series 2/images 32, 33, and 50. Musculoskeletal: Status post PLIF at L4-S1. Status post lateral fixation at L4-5. Status post anterior fixation at L5-S1. Mild degenerative changes of the lower thoracic spine. IMPRESSION: Improving bilateral adnexa masses in this patient with known ovarian cancer, as above. Suspected involvement of the sigmoid colon. No  evidence of bowel obstruction. Improving peritoneal nodularity, as above. Additional ancillary findings as above. Electronically Signed   By: Charline Bills M.D.   On: 08/04/2022 01:27  08/16/2022 Pathology Results   CASE: [id number] PATIENT: [name] Surgical Pathology Report  Clinical History: Advanced gyn malignancy (crm)  FINAL MICROSCOPIC DIAGNOSIS:  A. OMENTUM, RESECTION: - Positive for carcinoma  B. LIVER ADHESION, EXCISION: - Benign fibroadipose tissue - Negative for carcinoma  C. SMALL BOWEL MESENTERIC IMPLANT, EXCISION: - Positive for carcinoma with extensive necrosis  D. UTERUS, CERVIX, BILATERAL FALLOPIAN TUBES AND OVARIES, AND APPENDIX, RESECTION: - Uterine serosa: Tumor deposits present - Cervix: Benign, nabothian cyst - Endometrium: Benign inactive endometrium, benign endometrial polyp - Myometrium: Leiomyoma - Right fallopian tube: Metastatic tumor deposits present - Right ovary: High-grade serous carcinoma - Left ovary: High-grade serous carcinoma - Benign appendix - See oncology table  E. COLON, RECTOSIGMOID, RESECTION: - Benign colonic mucosa - Margins free of carcinoma - Adherent fallopian tube with metastatic tumor deposits - 2 benign lymph nodes  F. RECTAL TUMOR IMPLANT, EXCISION: - Metastatic high-grade serous carcinoma   G. COLON, RECTOSIGMOID ANASTOMOTIC RINGS: - Benign anastomotic donuts  ONCOLOGY TABLE:   OVARY or FALLOPIAN TUBE or PRIMARY PERITONEUM: Resection  Procedure: Total abdominal hysterectomy, bilateral salpingo-oophorectomy, appendectomy, rectosigmoid resection, total omentectomy, small bowel nodule resection Specimen Integrity: Intact Tumor Site: Bilateral Tumor Size: Right ovary: 3.9 cm, left ovary: Approximately 1.2 cm Histologic Type: High-grade serous carcinoma Histologic Grade: High-grade Ovarian Surface Involvement: Present Fallopian Tube Surface Involvement: Present Implants: Present, small bowel  mesenteric and rectovaginal septum Lymphatic and/or Vascular Invasion: Not identified Other Tissue/ Organ Involvement: Fallopian tube, bilateral Largest Extrapelvic Peritoneal Focus: 1.4 cm, small bowel mesentery Peritoneal/Ascitic Fluid Involvement: Not applicable Chemotherapy Response Score (CRS): Not applicable, no known presurgical therapy Regional Lymph Nodes: Not applicable (no lymph nodes submitted or found) Distant Metastasis:      Distant Site(s) Involved: Not applicable Pathologic Stage Classification (pTNM, AJCC 8th Edition): pT3b, pN[not assigned] Ancillary Studies: Can be performed upon request Representative Tumor Block: D8 Comment(s): Tumor is staged as pT3 given the presence of extrapelvic peritoneal deposits on small bowel mesentery less than 2cm (1.4 cm) ([id number])    09/18/2022 Tumor Marker   Patient's tumor was tested for the following markers: CA-125. Results of the tumor marker test revealed 36.8.   10/15/2022 Genetic Testing   Negative [facility] Multi-Cancer +RNA Panel.  VUS in POLD1 at c.1322C>T (p.Thr441Met). Report date is 10/15/2022.   The Multi-Cancer + RNA Panel offered by [facility] includes sequencing and/or deletion/duplication analysis of the following 70 genes:  AIP*, ALK, APC*, ATM*, AXIN2*, BAP1*, BARD1*, BLM*, BMPR1A*, BRCA1*, BRCA2*, BRIP1*, CDC73*, CDH1*, CDK4, CDKN1B*, CDKN2A, CHEK2*, CTNNA1*, DICER1*, EPCAM (del/dup only), EGFR, FH*, FLCN*, GREM1 (promoter dup only), HOXB13, KIT, LZTR1, MAX*, MBD4, MEN1*, MET, MITF, MLH1*, MSH2*, MSH3*, MSH6*, MUTYH*, NF1*, NF2*, NTHL1*, PALB2*, PDGFRA, PMS2*, POLD1*, POLE*, POT1*, PRKAR1A*, PTCH1*, PTEN*, RAD51C*, RAD51D*, RB1*, RET, SDHA* (sequencing only), SDHAF2*, SDHB*, SDHC*, SDHD*, SMAD4*, SMARCA4*, SMARCB1*, SMARCE1*, STK11*, SUFU*, TMEM127*, TP53*, TSC1*, TSC2*, VHL*. RNA analysis is performed for * genes.   11/06/2022 Tumor Marker   Patient's tumor was tested for the following markers: CA-125. Results of the tumor  marker test revealed 16.8.   12/11/2022 Tumor Marker   Patient's tumor was tested for the following markers: CA-125. Results of the tumor marker test revealed 12.5.   12/14/2022 Imaging   CT ABDOMEN PELVIS W CONTRAST  Result Date: 12/13/2022 CLINICAL DATA:  Ovarian cancer restaging * Tracking Code: BO * EXAM: CT ABDOMEN AND PELVIS WITH CONTRAST TECHNIQUE: Multidetector CT imaging of the abdomen and pelvis was performed using the standard protocol following bolus administration of intravenous  contrast. RADIATION DOSE REDUCTION: This exam was performed according to the departmental dose-optimization program which includes automated exposure control, adjustment of the mA and/or kV according to patient size and/or use of iterative reconstruction technique. CONTRAST:  OMNIPAQUE IOHEXOL 300 MG/ML  SOLN COMPARISON:  08/02/2022 FINDINGS: Lower chest: Unremarkable Hepatobiliary: Unremarkable Pancreas: Unremarkable Spleen: Unremarkable Adrenals/Urinary Tract: 2.6 by 2.1 cm right adrenal mass, internal density 55 Hounsfield units on portal venous phase images, relative washout of 13% which is indeterminate. Back in 2016 I measure this lesion at 2.2 by 1.6 cm, on the more recent comparison of 07/18/2022 this lesion measured 2.6 by 2.1 cm similar to today. Accordingly this lesion is stable from March and only very minimally increased in size from 2016, most likely benign. This can likely be surveilled in the context of the patient's ongoing oncology CT examinations. 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 The ureters transition to relatively normal caliber in the vicinity of the iliac vessel cross over, a specific cause for the transition is not identified. There is also a duplicated left renal collecting system without hydroureter on the left Stable 7 mm calcification or stone in the vicinity of the  left ureterovesical junction. Stomach/Bowel: Prominent stool throughout the colon favors constipation. Anastomotic staple line in the sigmoid colon. No dilated small bowel. Interval appendectomy. Previous nodularity along bowel including the sigmoid colon no longer identified. Vascular/Lymphatic: Atherosclerosis is present, including aortoiliac atherosclerotic disease. No pathologic adenopathy. Reproductive: Interval hysterectomy with salpingo-oophorectomy. Other: Interval omentectomy. No current compelling findings of peritoneal tumor deposits. No ascites. Musculoskeletal: Prior fusion at L4-L5-S1. IMPRESSION: 1. Interval hysterectomy, omentectomy, and appendectomy. No compelling findings of recurrent or residual peritoneal tumor deposits. 2. 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 A specific cause for the transition in caliber is not identified. 3. Stable 7 mm calcification or stone in the vicinity of the left ureterovesical junction. Duplicated left renal collecting system. 4. Prominent stool throughout the colon favors constipation. 5. Chronically stable but otherwise nonspecific right adrenal mass, likely benign. 6. Aortic atherosclerosis. Aortic Atherosclerosis (ICD10-I70.0). Electronically Signed   By: Gaylyn Rong M.D.   On: 12/13/2022 15:16      12/20/2022 Procedure   Successful removal of a RIGHT chest implanted Port-A-Cath.      Interval History: Doing well.  Denies any abdominal or pelvic pain.  Does have some cramping sensation with bowel movements.  Endorses regular, soft bowel movements.  Denies pain outside of when she is having a bowel movement.  Denies any vaginal bleeding or discharge.  Reports some slower initiation of her urine stream, otherwise denies any urinary symptoms.  Appetite is good, has gained some weight.  Past  Medical/Surgical History: Past Medical History:  Diagnosis Date   Anxiety    Asthma    Back problem    disc disease, chronic low back pain, right leg pain, s/p discectomy 99   Cancer (HCC)    ovarian cancer   Depression    Diastolic dysfunction    with elevated LVEDP at cath   Endometriosis    Fibroids    hx of    GERD (gastroesophageal reflux disease)    Heart murmur    Hepatitis C    treated 8-9 years ago   Hidradenitis suppurativa  History of kidney stones    Hypertension    MS (multiple sclerosis) (HCC)    Neuromuscular disorder (HCC)    Mulitple sclerosis   Osteoarthritis    Pre-diabetes    Prediabetes    RA (rheumatoid arthritis) (HCC)    Rotator cuff tear    right shoulder    Syncope     Past Surgical History:  Procedure Laterality Date   ANGIOPLASTY     BACK SURGERY     lumbar surgery x 2 done in [place] and [place]   COLON RESECTION SIGMOID  08/16/2022   Procedure: COLON RESECTION SIGMOID;  Surgeon: Carver Fila, MD;  Location: [facility];  Service: Gynecology;;   COLONOSCOPY  07/2010   tubular adenoma   COLONOSCOPY  08/2015   CYSTOSCOPY N/A 08/16/2022   Procedure: CYSTOSCOPY;  Surgeon: Carver Fila, MD;  Location: [facility];  Service: Gynecology;  Laterality: N/A;   DISKECTOMY     HYSTERECTOMY ABDOMINAL WITH SALPINGO-OOPHORECTOMY  08/16/2022   Procedure: HYSTERECTOMY ABDOMINAL WITH SALPINGO-OOPHORECTOMY;  Surgeon: Carver Fila, MD;  Location: [facility];  Service: Gynecology;;   IR IMAGING GUIDED PORT INSERTION  05/18/2022   IR REMOVAL TUN ACCESS W/ PORT W/O FL MOD SED  12/20/2022   IR US GUIDE BX ASP/DRAIN  05/18/2022   LAPAROSCOPY N/A 08/16/2022   Procedure: LAPAROSCOPY DIAGNOSTIC;  Surgeon: Carver Fila, MD;  Location: [facility];  Service: Gynecology;  Laterality: N/A;   laparoscopy for endometriosis     left shoulder scope     RADIOLOGY WITH ANESTHESIA N/A 05/18/2022   Procedure: IR WITH ANESTHESIA PORT AND BIOPSY;  Surgeon:  Irish Lack, MD;  Location: [facility];  Service: Radiology;  Laterality: N/A;   ROTATOR CUFF REPAIR Left    Spinal Fusion  12/2016   L4-S1    Family History  Problem Relation Age of Onset   Heart disease Mother        Died with MI 3, chest pain 30s   Hypertension Mother    Pancreatitis Father    Atrial fibrillation Sister    Breast cancer Maternal Aunt        dx <50   Stomach cancer Maternal Grandfather        mets to liver? dx after 50   Multiple sclerosis Cousin    Breast cancer Cousin        mat female cousin; dx unknown age   Colon cancer Cousin 23       mat female cousin; mets   Leukemia Cousin 65       mat female cousin    Social History   Socioeconomic History   Marital status: Single    Spouse name: Not on file   Number of children: 1   Years of education: Not on file   Highest education level: Not on file  Occupational History   Not on file  Tobacco Use   Smoking status: Some Days    Current packs/day: 0.00    Average packs/day: 0.1 packs/day for 27.0 years (2.7 ttl pk-yrs)    Types: Cigarettes    Start date: 99    Last attempt to quit: 2023    Years since quitting: 1.8   Smokeless tobacco: Never  Vaping Use   Vaping status: Never Used  Substance and Sexual Activity   Alcohol use: Yes    Comment: Occasional    Drug use: Yes    Types: Marijuana   Sexual activity: Yes  Other Topics  Concern   Not on file  Social History Narrative   Lives alone   Right handed   Caffeine: none    Social Determinants of Health   Financial Resource Strain: Not on file  Food Insecurity: No Food Insecurity (08/17/2022)   Hunger Vital Sign    Worried About Running Out of Food in the Last Year: Never true    Ran Out of Food in the Last Year: Never true  Transportation Needs: No Transportation Needs (08/17/2022)   PRAPARE - Administrator, Civil Service (Medical): No    Lack of Transportation (Non-Medical): No  Physical Activity: Not on file  Stress: Not  on file  Social Connections: Not on file    Current Medications:  Current Outpatient Medications:    albuterol (VENTOLIN HFA) 108 (90 Base) MCG/ACT inhaler, Inhale 2 puffs into the lungs every 6 (six) hours as needed for shortness of breath., Disp: , Rfl:    amphetamine-dextroamphetamine (ADDERALL) 20 MG tablet, Take 10-20 mg by mouth See admin instructions. Take 20 mg by mouth in the morning and 10 mg between 1-2 PM daily, Disp: , Rfl:    Ascorbic Acid (VITAMIN C) 1000 MG tablet, Take 1,000 mg by mouth daily., Disp: , Rfl:    ASHWAGANDHA PO, Take 1 capsule by mouth daily., Disp: , Rfl:    BLACK CURRANT SEED OIL PO, Take 1 capsule by mouth daily., Disp: , Rfl:    diclofenac Sodium (VOLTAREN) 1 % GEL, Apply 2 g topically 2 (two) times daily., Disp: , Rfl:    irbesartan (AVAPRO) 300 MG tablet, Take 300 mg by mouth daily., Disp: , Rfl:    LORazepam (ATIVAN) 0.5 MG tablet, Take 0.5 mg by mouth 3 (three) times daily as needed for anxiety., Disp: , Rfl:    Melatonin 10 MG TABS, Take 10 mg by mouth at bedtime as needed (sleep)., Disp: , Rfl:    Multiple Vitamin (MULTIVITAMIN WITH MINERALS) TABS tablet, Take 1 tablet by mouth daily., Disp: , Rfl:    mupirocin ointment (BACTROBAN) 2 %, Apply 1 Application topically 3 (three) times daily as needed (HS flares)., Disp: , Rfl:    naloxone (NARCAN) nasal spray 4 mg/0.1 mL, Place 1 spray into the nose as needed (opioid overdose)., Disp: , Rfl:    omeprazole (PRILOSEC) 20 MG capsule, Take 20 mg by mouth daily as needed., Disp: , Rfl:    OVER THE COUNTER MEDICATION, Take 2 capsules by mouth daily. [place] advanced supplement, Disp: , Rfl:    oxyCODONE ER (XTAMPZA ER) 13.5 MG C12A, Take 13.5 mg by mouth in the morning, at noon, and at bedtime., Disp: , Rfl:    Oxycodone HCl 20 MG TABS, Take 20 mg by mouth 5 (five) times daily as needed (pain)., Disp: , Rfl:    polyethylene glycol (MIRALAX / GLYCOLAX) 17 g packet, Take 17 g by mouth daily., Disp: , Rfl:     Vitamin D, Ergocalciferol, (DRISDOL) 1.25 MG (50000 UNIT) CAPS capsule, Take 50,000 Units by mouth every Sunday., Disp: , Rfl:    WIXELA INHUB 250-50 MCG/ACT AEPB, Inhale 1 puff into the lungs daily., Disp: , Rfl:   Review of Systems: + joint pain Denies appetite changes, fevers, chills, fatigue, unexplained weight changes. Denies hearing loss, neck lumps or masses, mouth sores, ringing in ears or voice changes. Denies cough or wheezing.  Denies shortness of breath. Denies chest pain or palpitations. Denies leg swelling. Denies abdominal distention, pain, blood in stools, constipation, diarrhea,  nausea, vomiting, or early satiety. Denies pain with intercourse, dysuria, frequency, hematuria or incontinence. Denies hot flashes, pelvic pain, vaginal bleeding or vaginal discharge.   Denies back pain or muscle pain/cramps. Denies itching, rash, or wounds. Denies dizziness, headaches, numbness or seizures. Denies swollen lymph nodes or glands, denies easy bruising or bleeding. Denies anxiety, depression, confusion, or decreased concentration.  Physical Exam: BP (!) 161/93 (BP Location: Right Arm, Patient Position: Sitting) Comment: 152/89 rechecked notified rn  Pulse 84   Temp 98.7 F (37.1 C) (Oral)   Resp 20   Wt 182 lb 9.6 oz (82.8 kg)   SpO2 100%   BMI 27.04 kg/m  Blood pressure 156/84 General: Alert, oriented, no acute distress. HEENT: Normocephalic, atraumatic, sclera anicteric. Chest: Clear to auscultation bilaterally.  No wheezes or rhonchi. Cardiovascular: Regular rate and rhythm, no murmurs. Abdomen: soft, nontender.  Normoactive bowel sounds.  No masses or hepatosplenomegaly appreciated.  Well-healed scar. Extremities: Grossly normal range of motion.  Warm, well perfused.  No edema bilaterally. Skin: No rashes or lesions noted. Lymphatics: No cervical, supraclavicular, or inguinal adenopathy. GU: Normal appearing external genitalia without erythema, excoriation, or lesions.   Speculum exam reveals normal vaginal mucosa, no visible lesions, cuff intact.  There appears to be a clip at the top of the vagina.  This was passed with a ring forcep and indeed appears to be a large clip.  Very small amount of bleeding noted from the left vaginal apex where this was removed, treated with silver nitrate.  Bimanual exam reveals cuff intact, no nodularity or masses appreciated.  Rectovaginal exam confirms findings, I am not able to elicit pain in the location where she has cramping with bowel movements.  Laboratory & Radiologic Studies:         Component Ref Range & Units 3 mo ago 4 mo ago 6 mo ago 7 mo ago 8 mo ago 11 mo ago  Cancer Antigen (CA) 125 0.0 - 38.1 U/mL 12.5 16.8 CM 20.1 CM 36.8 CM 134.0 High  CM 123.0 High  CM     Assessment & Plan: [name] is a 67 y.o. 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  Germline testing negative. HRP.  Doing well.  She is NED on exam today.  Interestingly, there was a large clip in her vagina.  I think this must have worked its way through the apex of the vaginal cuff.  It did look more like a clip than a staple.  This was removed without any difficulty.   [facility] surveillance recommendations, we will continue with surveillance visits every 3 months.  She sees Dr. Bertis Ruddy for her next appointment in 3 months and I will see her back in 6.  CA125 will be drawn today.  We discussed signs and symptoms that should prompt a phone call before her neck scheduled visit.  20 minutes of total time was spent for this patient encounter, including preparation, face-to-face counseling with the patient and coordination of care, and documentation of the encounter.  Eugene Garnet, MD  Division of Gynecologic Oncology  Department of Obstetrics and Gynecology  [facility]

## 2023-03-16 NOTE — Patient Instructions (Signed)
It was good to see you today.  I do not see or feel any evidence of cancer recurrence on your exam.  I will see you for follow-up in 6 months.  As always, if you develop any new and concerning symptoms before your next visit, please call to see me sooner.   

## 2023-03-17 LAB — CA 125: Cancer Antigen (CA) 125: 19.7 U/mL (ref 0.0–38.1)

## 2023-03-21 ENCOUNTER — Ambulatory Visit
Admission: RE | Admit: 2023-03-21 | Discharge: 2023-03-21 | Disposition: A | Discharge status: Home / Self Care | Payer: 59 | Source: Ambulatory Visit | Attending: Family Medicine | Admitting: Family Medicine

## 2023-03-21 DIAGNOSIS — Z1231 Encounter for screening mammogram for malignant neoplasm of breast: Secondary | ICD-10-CM

## 2023-04-10 DIAGNOSIS — M542 Cervicalgia: Secondary | ICD-10-CM | POA: Diagnosis not present

## 2023-04-19 DIAGNOSIS — Z23 Encounter for immunization: Secondary | ICD-10-CM | POA: Diagnosis not present

## 2023-04-19 DIAGNOSIS — J4520 Mild intermittent asthma, uncomplicated: Secondary | ICD-10-CM | POA: Diagnosis not present

## 2023-04-19 DIAGNOSIS — R7303 Prediabetes: Secondary | ICD-10-CM | POA: Diagnosis not present

## 2023-04-19 DIAGNOSIS — M069 Rheumatoid arthritis, unspecified: Secondary | ICD-10-CM | POA: Diagnosis not present

## 2023-04-19 DIAGNOSIS — G35 Multiple sclerosis: Secondary | ICD-10-CM | POA: Diagnosis not present

## 2023-04-19 DIAGNOSIS — G620 Drug-induced polyneuropathy: Secondary | ICD-10-CM | POA: Diagnosis not present

## 2023-04-19 DIAGNOSIS — E7800 Pure hypercholesterolemia, unspecified: Secondary | ICD-10-CM | POA: Diagnosis not present

## 2023-04-19 DIAGNOSIS — F17200 Nicotine dependence, unspecified, uncomplicated: Secondary | ICD-10-CM | POA: Diagnosis not present

## 2023-04-19 DIAGNOSIS — I10 Essential (primary) hypertension: Secondary | ICD-10-CM | POA: Diagnosis not present

## 2023-04-19 DIAGNOSIS — Z0000 Encounter for general adult medical examination without abnormal findings: Secondary | ICD-10-CM | POA: Diagnosis not present

## 2023-04-23 ENCOUNTER — Telehealth: Payer: Self-pay

## 2023-04-23 NOTE — Telephone Encounter (Signed)
Called and left a message asking her to call radiology scheduling to schedule CT on 2/7 after lab appt. Left radiology scheduling # and ask her to call the office for questions.

## 2023-05-22 DIAGNOSIS — M542 Cervicalgia: Secondary | ICD-10-CM | POA: Diagnosis not present

## 2023-05-22 DIAGNOSIS — M25512 Pain in left shoulder: Secondary | ICD-10-CM | POA: Diagnosis not present

## 2023-05-22 DIAGNOSIS — M25511 Pain in right shoulder: Secondary | ICD-10-CM | POA: Diagnosis not present

## 2023-06-05 DIAGNOSIS — M542 Cervicalgia: Secondary | ICD-10-CM | POA: Diagnosis not present

## 2023-06-08 DIAGNOSIS — M542 Cervicalgia: Secondary | ICD-10-CM | POA: Diagnosis not present

## 2023-06-11 DIAGNOSIS — M5412 Radiculopathy, cervical region: Secondary | ICD-10-CM | POA: Diagnosis not present

## 2023-06-15 ENCOUNTER — Inpatient Hospital Stay: Payer: 59 | Attending: Gynecologic Oncology

## 2023-06-15 ENCOUNTER — Ambulatory Visit (HOSPITAL_COMMUNITY)
Admission: RE | Admit: 2023-06-15 | Discharge: 2023-06-15 | Disposition: A | Discharge status: Home / Self Care | Payer: 59 | Source: Ambulatory Visit | Attending: Hematology and Oncology | Admitting: Hematology and Oncology

## 2023-06-15 ENCOUNTER — Encounter (HOSPITAL_COMMUNITY): Payer: Self-pay

## 2023-06-15 DIAGNOSIS — C563 Malignant neoplasm of bilateral ovaries: Secondary | ICD-10-CM | POA: Insufficient documentation

## 2023-06-15 DIAGNOSIS — N134 Hydroureter: Secondary | ICD-10-CM | POA: Diagnosis not present

## 2023-06-15 DIAGNOSIS — C786 Secondary malignant neoplasm of retroperitoneum and peritoneum: Secondary | ICD-10-CM | POA: Diagnosis not present

## 2023-06-15 DIAGNOSIS — Z5111 Encounter for antineoplastic chemotherapy: Secondary | ICD-10-CM | POA: Diagnosis not present

## 2023-06-15 LAB — CBC WITH DIFFERENTIAL/PLATELET
Abs Immature Granulocytes: 0.01 10*3/uL (ref 0.00–0.07)
Basophils Absolute: 0 10*3/uL (ref 0.0–0.1)
Basophils Relative: 1 %
Eosinophils Absolute: 0.1 10*3/uL (ref 0.0–0.5)
Eosinophils Relative: 2 %
HCT: 44.9 % (ref 36.0–46.0)
Hemoglobin: 15.3 g/dL — ABNORMAL HIGH (ref 12.0–15.0)
Immature Granulocytes: 0 %
Lymphocytes Relative: 31 %
Lymphs Abs: 1.3 10*3/uL (ref 0.7–4.0)
MCH: 34.2 pg — ABNORMAL HIGH (ref 26.0–34.0)
MCHC: 34.1 g/dL (ref 30.0–36.0)
MCV: 100.2 fL — ABNORMAL HIGH (ref 80.0–100.0)
Monocytes Absolute: 0.6 10*3/uL (ref 0.1–1.0)
Monocytes Relative: 15 %
Neutro Abs: 2.2 10*3/uL (ref 1.7–7.7)
Neutrophils Relative %: 51 %
Platelets: 289 10*3/uL (ref 150–400)
RBC: 4.48 MIL/uL (ref 3.87–5.11)
RDW: 11.9 % (ref 11.5–15.5)
WBC: 4.3 10*3/uL (ref 4.0–10.5)
nRBC: 0 % (ref 0.0–0.2)

## 2023-06-15 LAB — COMPREHENSIVE METABOLIC PANEL
ALT: 21 U/L (ref 0–44)
AST: 19 U/L (ref 15–41)
Albumin: 4.5 g/dL (ref 3.5–5.0)
Alkaline Phosphatase: 94 U/L (ref 38–126)
Anion gap: 5 (ref 5–15)
BUN: 17 mg/dL (ref 8–23)
CO2: 30 mmol/L (ref 22–32)
Calcium: 9.5 mg/dL (ref 8.9–10.3)
Chloride: 104 mmol/L (ref 98–111)
Creatinine, Ser: 0.91 mg/dL (ref 0.44–1.00)
GFR, Estimated: >60 mL/min (ref >60)
Glucose, Bld: 130 mg/dL — ABNORMAL HIGH (ref 70–99)
Potassium: 3.9 mmol/L (ref 3.5–5.1)
Sodium: 139 mmol/L (ref 135–145)
Total Bilirubin: 0.4 mg/dL (ref 0.0–1.2)
Total Protein: 7.7 g/dL (ref 6.5–8.1)

## 2023-06-15 MED ORDER — IOHEXOL 300 MG/ML  SOLN
30.0000 mL | Freq: Once | INTRAMUSCULAR | Status: AC | PRN
  Administered 2023-06-15: 30 mL via ORAL

## 2023-06-15 MED ORDER — IOHEXOL 300 MG/ML  SOLN
100.0000 mL | Freq: Once | INTRAMUSCULAR | Status: AC | PRN
  Administered 2023-06-15: 100 mL via INTRAVENOUS

## 2023-06-17 LAB — CA 125: Cancer Antigen (CA) 125: 32.9 U/mL (ref 0.0–38.1)

## 2023-06-21 ENCOUNTER — Inpatient Hospital Stay (HOSPITAL_BASED_OUTPATIENT_CLINIC_OR_DEPARTMENT_OTHER): Payer: 59 | Admitting: Hematology and Oncology

## 2023-06-21 ENCOUNTER — Encounter: Payer: Self-pay | Admitting: Hematology and Oncology

## 2023-06-21 VITALS — BP 150/87 | HR 78 | Temp 97.4°F | Resp 18 | Ht 68.9 in | Wt 188.4 lb

## 2023-06-21 DIAGNOSIS — C563 Malignant neoplasm of bilateral ovaries: Secondary | ICD-10-CM

## 2023-06-21 DIAGNOSIS — K5909 Other constipation: Secondary | ICD-10-CM

## 2023-06-21 DIAGNOSIS — Z5111 Encounter for antineoplastic chemotherapy: Secondary | ICD-10-CM | POA: Diagnosis not present

## 2023-06-21 DIAGNOSIS — C786 Secondary malignant neoplasm of retroperitoneum and peritoneum: Secondary | ICD-10-CM | POA: Diagnosis not present

## 2023-06-21 NOTE — Assessment & Plan Note (Signed)
We discussed importance of aggressive laxative therapy

## 2023-06-21 NOTE — Progress Notes (Signed)
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 We discussed treatment options We discussed risk, benefits, side effects of carboplatin/gemcitabine/bevacizumab versus carboplatin/Doxil/bevacizumab I recommend consideration for port placement again We discussed the need for echocardiogram for treatment that require Doxil At the end of the discussion, she is undecided The patient will call back with her final decision latest by next week  Other constipation We discussed importance of aggressive laxative therapy  Orders Placed This Encounter  Procedures   CT ABDOMEN PELVIS W CONTRAST    Standing Status:   Future    Expected Date:   12/19/2023    Expiration Date:   06/20/2024    If indicated for the ordered procedure, I authorize the administration of contrast media per Radiology protocol:   Yes    Does the patient have a contrast media/X-ray dye allergy?:   No    Preferred imaging location?:   [facility]    If indicated for the ordered procedure, I authorize the administration of oral contrast media per Radiology protocol:   Yes    All questions were answered. The patient knows to call the clinic with any problems, questions or concerns. The total time spent in the appointment was 40 minutes encounter with patients including review of chart and various tests results, discussions about plan of care and coordination of care plan   Artis Delay, MD 06/21/2023 12:53 PM  INTERVAL HISTORY: Please see below for problem oriented charting. she returns for surveillance follow-up We reviewed imaging study results She continues to have chronic pain and  chronic constipation She had residual neuropathy We discussed different treatment options From the cancer recurrence standpoint, she is not symptomatic  REVIEW OF SYSTEMS:   Constitutional: Denies fevers, chills or abnormal weight loss Eyes: Denies blurriness of vision Ears, nose, mouth, throat, and face: Denies mucositis or sore throat Respiratory: Denies cough, dyspnea or wheezes Cardiovascular: Denies palpitation, chest discomfort or lower extremity swelling Skin: Denies abnormal skin rashes Lymphatics: Denies new lymphadenopathy or easy bruising Neurological:Denies numbness, tingling or new weaknesses Behavioral/Psych: Mood is stable, no new changes  All other systems were reviewed with the patient and are negative.  I have reviewed the past medical history, past surgical history, social history and family history with the patient and they are unchanged from previous note.  ALLERGIES:  is allergic to cymbalta [duloxetine hcl], cyclobenzaprine hcl, celexa  [citalopram hydrobromide], fluticasone furoate-vilanterol, gabapentin, opana  [oxymorphone hcl], and tramadol.  MEDICATIONS:  Current Outpatient Medications  Medication Sig Dispense Refill   albuterol (VENTOLIN HFA) 108 (90 Base) MCG/ACT inhaler Inhale 2 puffs into the lungs every 6 (six) hours as needed for shortness of breath.     amphetamine-dextroamphetamine (ADDERALL) 20 MG tablet Take 10-20 mg by mouth See admin instructions. Take 20 mg by mouth in the morning and 10 mg between 1-2 PM daily     Ascorbic Acid (VITAMIN C) 1000 MG tablet Take 1,000 mg by mouth daily.     ASHWAGANDHA PO Take 1 capsule by mouth daily.     BLACK CURRANT SEED OIL PO Take 1 capsule by mouth daily.     diclofenac Sodium (VOLTAREN) 1 % GEL Apply 2 g topically 2 (two)  times daily.     irbesartan (AVAPRO) 300 MG tablet Take 300 mg by mouth daily.     LORazepam (ATIVAN) 0.5 MG tablet Take 0.5 mg by mouth 3 (three) times daily as needed for anxiety.      Melatonin 10 MG TABS Take 10 mg by mouth at bedtime as needed (sleep).     Multiple Vitamin (MULTIVITAMIN WITH MINERALS) TABS tablet Take 1 tablet by mouth daily.     mupirocin ointment (BACTROBAN) 2 % Apply 1 Application topically 3 (three) times daily as needed (HS flares).     naloxone (NARCAN) nasal spray 4 mg/0.1 mL Place 1 spray into the nose as needed (opioid overdose).     omeprazole (PRILOSEC) 20 MG capsule Take 20 mg by mouth daily as needed.     OVER THE COUNTER MEDICATION Take 2 capsules by mouth daily. Sea Moss advanced supplement     oxyCODONE ER (XTAMPZA ER) 13.5 MG C12A Take 13.5 mg by mouth in the morning, at noon, and at bedtime.     Oxycodone HCl 20 MG TABS Take 20 mg by mouth 5 (five) times daily as needed (pain).     polyethylene glycol (MIRALAX / GLYCOLAX) 17 g packet Take 17 g by mouth daily.     Vitamin D, Ergocalciferol, (DRISDOL) 1.25 MG (50000 UNIT) CAPS capsule Take 50,000 Units by mouth every Sunday.     WIXELA INHUB 250-50 MCG/ACT AEPB Inhale 1 puff into the lungs daily.     No current facility-administered medications for this visit.    SUMMARY OF ONCOLOGIC HISTORY: Oncology History Overview Note  High grade serous, p53 mutated Neg genetics, HRD not detected, MSI stable, low TMB 2   Bilateral primary ovarian cancer (HCC)  04/11/2022 Imaging   CT of the abdomen and pelvis on 04/11/2022 reveals a left adnexal masslike area measuring 5.9 x 3.6 cm.  Margins of this mass are irregular.  There is small fluid in the cul-de-sac as well as fullness of soft tissue about the right adnexa.  Discrete uterine structure is not identified.  Left upper quadrant nodules beneath the left hemidiaphragm (1 anterior to the spleen and the other to the stomach).  Omental/peritoneal nodularity and multiple perihepatic nodules are noted    04/18/2022 Tumor Marker   Patient's tumor was tested for the following markers: CA-125. Results of the tumor marker test revealed 123.   04/19/2022  Initial Diagnosis   Carcinomatosis (HCC)   04/28/2022 Imaging   1. Bulky bilateral ovarian masses and nodules, left-greater-than-right, which are essentially confluent with adjacent pelvic soft tissue nodularity and not significantly changed compared to prior examination dated 04/11/2022. 2. Extensive pelvic peritoneal thickening and nodularity. Multiple small peritoneal nodules throughout the abdomen and pelvis. Findings are consistent with peritoneal metastatic disease and likewise not significantly changed. 3. Small volume of loculated appearing fluid in the low pelvis. 4. 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 The mid to distal right ureter is obstructed by right ovarian mass and or soft tissue nodularity. 5. Calculus within the most distal left ureterovesicular junction or just within the bladder lumen measuring 0.7 cm, unchanged. No associated left-sided hydronephrosis. 6. Soft tissue attenuation nodule of the body of the right adrenal gland. Notably, this was also soft tissue attenuation on prior noncontrast examination (i.e. not definitively macroscopic fat containing) although unchanged compared to prior examinations dating back to 2018 and almost certainly a benign adenoma. Attention on follow-up.  7. No evidence of metastatic disease in the chest. 8. Mild diffuse bilateral bronchial wall thickening. Background of very fine centrilobular nodularity, most concentrated in the lung apices. Findings are most consistent with smoking-related respiratory bronchiolitis.     05/18/2022 Procedure   Ultrasound-guided core biopsy performed of a 10 mm soft tissue peritoneal mass in the right upper quadrant just superficial to the right lobe of the liver. The procedure was performed under general anesthesia immediately following Port-A-Cath placement.   05/18/2022 Procedure   Placement of single lumen  port a cath via right internal jugular vein. The catheter tip lies at the cavo-atrial junction. A power injectable port a cath was placed and is ready for immediate use.     05/18/2022 Pathology Results   A. 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  The findings support the above interpretation.  This case was reviewed with Dr. Kenard Gower who agrees with the above diagnosis.  A p16 stain is pending and will be reported in an addendum.    05/23/2022 Cancer Staging   Staging form: Ovary, Fallopian Tube, and Primary Peritoneal Carcinoma, AJCC 8th Edition - Clinical stage from 05/23/2022: FIGO Stage IIIC (cT3c, cN0, cM0) - Signed by Artis Delay, MD on 05/23/2022 Stage prefix: Initial diagnosis   05/29/2022 Imaging   1. Complex bilateral adnexal masses, as described above, consistent with the patient's known malignancy. 2. Subsequent marked severity mass effect on the distal bilateral common iliac veins, increased in severity when compared to the prior exam. 3. Additional findings consistent with peritoneal metastasis within the pelvis. 4. Findings consistent with pelvic congestion syndrome. 5. Postoperative changes within the mid and lower lumbar spine. 6. Aortic atherosclerosis. 7. No evidence of venous thrombus within the abdomen, pelvis or proximal bilateral lower extremities.   Aortic Atherosclerosis (ICD10-I70.0).   06/02/2022 - 11/03/2022 Chemotherapy   Patient is on Treatment Plan : OVARIAN Carboplatin (AUC 6) + Paclitaxel (175) q21d X 6 Cycles     06/26/2022 Tumor Marker   Patient's tumor was tested for the following markers: CA-125. Results of the tumor marker test revealed 134.   08/04/2022 Imaging   CT ABDOMEN PELVIS W CONTRAST  Result Date: 08/04/2022 CLINICAL DATA:  Ovarian cancer, chemotherapy in progress, for  restaging EXAM: CT ABDOMEN AND PELVIS WITH CONTRAST TECHNIQUE: Multidetector CT imaging of the abdomen and pelvis was performed using the standard protocol following bolus administration of intravenous contrast. RADIATION DOSE REDUCTION: This exam was performed according to the departmental dose-optimization program which includes automated exposure control, adjustment of the mA and/or kV according to patient size and/or use of iterative reconstruction technique. CONTRAST:  OMNIPAQUE IOHEXOL 300 MG/ML  SOLN COMPARISON:  CT venogram abdomen/pelvis dated 05/29/2022. CT abdomen/pelvis dated 04/26/2022. FINDINGS: Lower chest: Lung bases are clear. Hepatobiliary: Liver is within normal limits. Gallbladder is unremarkable. No intrahepatic or extrahepatic ductal dilatation. Pancreas: Within normal limits. Spleen: Within normal limits. Adrenals/Urinary Tract: 2.4 cm right adrenal nodule (series 2/image 17), previously 2.0 cm in 2018, likely reflecting a benign adrenal adenoma. Left adrenal gland is within normal limits. Left kidney is within normal limits. 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 Associated extrinsic compression at the level of the right adnexal mass (described below). Mildly thick-walled bladder, although underdistended. Stable 6 mm left UVJ calcification (series 2/image 68).  Stomach/Bowel: Stomach is within normal limits. No evidence of bowel obstruction. Normal appendix (series 2/image 47). Left adnexal mass (described below) is favored to abut and directly invade the left lateral wall of the sigmoid colon (series 2/image 57). Vascular/Lymphatic: No evidence of abdominal aortic aneurysm. Atherosclerotic calcifications of the abdominal aorta and branch vessels. No suspicious abdominopelvic lymphadenopathy. Reproductive: Heterogeneous uterus with associated surface nodularity (series 2/image 65), likely  reflecting peritoneal disease. This appearance is improved from the prior. 3.9 x 3.3 cm left adnexal mass (series 2/image 60), previously 5.3 x 3.9 cm, with extension along the sigmoid mesocolon (series 2/image 56) and suspected involvement of the sigmoid colon (series 2/image 57). 5.6 x 2.7 cm mixed cystic/solid right adnexal mass (series 2/image 89), previously 6.9 x 4.3 cm when measured in a similar fashion. Other: No abdominopelvic ascites. Scattered minimal peritoneal nodularity beneath the anterior abdominal wall, including a 5 mm nodule beneath the left anterior abdominal wall (series 2/image 27) which previously measured 12 mm. Additional mild nodularity on series 2/images 32, 33, and 50. Musculoskeletal: Status post PLIF at L4-S1. Status post lateral fixation at L4-5. Status post anterior fixation at L5-S1. Mild degenerative changes of the lower thoracic spine. IMPRESSION: Improving bilateral adnexa masses in this patient with known ovarian cancer, as above. Suspected involvement of the sigmoid colon. No evidence of bowel obstruction. Improving peritoneal nodularity, as above. Additional ancillary findings as above. Electronically Signed   By: Charline Bills M.D.   On: 08/04/2022 01:27      08/16/2022 Pathology Results   CASE: [id number] PATIENT: Donnamae Jude Surgical Pathology Report  Clinical History: Advanced gyn malignancy (crm)  FINAL MICROSCOPIC DIAGNOSIS:  A. OMENTUM, RESECTION: - Positive for carcinoma  B. LIVER ADHESION, EXCISION: - Benign fibroadipose tissue - Negative for carcinoma  C. SMALL BOWEL MESENTERIC IMPLANT, EXCISION: - Positive for carcinoma with extensive necrosis  D. UTERUS, CERVIX, BILATERAL FALLOPIAN TUBES AND OVARIES, AND APPENDIX, RESECTION: - Uterine serosa: Tumor deposits present - Cervix: Benign, nabothian cyst - Endometrium: Benign inactive endometrium, benign endometrial polyp - Myometrium: Leiomyoma - Right fallopian tube: Metastatic tumor  deposits present - Right ovary: High-grade serous carcinoma - Left ovary: High-grade serous carcinoma - Benign appendix - See oncology table  E. COLON, RECTOSIGMOID, RESECTION: - Benign colonic mucosa - Margins free of carcinoma - Adherent fallopian tube with metastatic tumor deposits - 2 benign lymph nodes  F. RECTAL TUMOR IMPLANT, EXCISION: - Metastatic high-grade serous carcinoma   G. COLON, RECTOSIGMOID ANASTOMOTIC RINGS: - Benign anastomotic donuts  ONCOLOGY TABLE:   OVARY or FALLOPIAN TUBE or PRIMARY PERITONEUM: Resection  Procedure: Total abdominal hysterectomy, bilateral salpingo-oophorectomy, appendectomy, rectosigmoid resection, total omentectomy, small bowel nodule resection Specimen Integrity: Intact Tumor Site: Bilateral Tumor Size: Right ovary: 3.9 cm, left ovary: Approximately 1.2 cm Histologic Type: High-grade serous carcinoma Histologic Grade: High-grade Ovarian Surface Involvement: Present Fallopian Tube Surface Involvement: Present Implants: Present, small bowel mesenteric and rectovaginal septum Lymphatic and/or Vascular Invasion: Not identified Other Tissue/ Organ Involvement: Fallopian tube, bilateral Largest Extrapelvic Peritoneal Focus: 1.4 cm, small bowel mesentery Peritoneal/Ascitic Fluid Involvement: Not applicable Chemotherapy Response Score (CRS): Not applicable, no known presurgical therapy Regional Lymph Nodes: Not applicable (no lymph nodes submitted or found) Distant Metastasis:      Distant Site(s) Involved: Not applicable Pathologic Stage Classification (pTNM, AJCC 8th Edition): pT3b, pN[not assigned] Ancillary Studies: Can be performed upon request Representative Tumor Block: D8 Comment(s): Tumor is staged as pT3 given the presence of extrapelvic peritoneal deposits on small bowel mesentery less  than 2cm (1.4 cm) ([id number])    09/18/2022 Tumor Marker   Patient's tumor was tested for the following markers: CA-125. Results of the  tumor marker test revealed 36.8.   10/15/2022 Genetic Testing   Negative [facility] Multi-Cancer +RNA Panel.  VUS in POLD1 at c.1322C>T (p.Thr441Met). Report date is 10/15/2022.   The Multi-Cancer + RNA Panel offered by [facility] includes sequencing and/or deletion/duplication analysis of the following 70 genes:  AIP*, ALK, APC*, ATM*, AXIN2*, BAP1*, BARD1*, BLM*, BMPR1A*, BRCA1*, BRCA2*, BRIP1*, CDC73*, CDH1*, CDK4, CDKN1B*, CDKN2A, CHEK2*, CTNNA1*, DICER1*, EPCAM (del/dup only), EGFR, FH*, FLCN*, GREM1 (promoter dup only), HOXB13, KIT, LZTR1, MAX*, MBD4, MEN1*, MET, MITF, MLH1*, MSH2*, MSH3*, MSH6*, MUTYH*, NF1*, NF2*, NTHL1*, PALB2*, PDGFRA, PMS2*, POLD1*, POLE*, POT1*, PRKAR1A*, PTCH1*, PTEN*, RAD51C*, RAD51D*, RB1*, RET, SDHA* (sequencing only), SDHAF2*, SDHB*, SDHC*, SDHD*, SMAD4*, SMARCA4*, SMARCB1*, SMARCE1*, STK11*, SUFU*, TMEM127*, TP53*, TSC1*, TSC2*, VHL*. RNA analysis is performed for * genes.   11/06/2022 Tumor Marker   Patient's tumor was tested for the following markers: CA-125. Results of the tumor marker test revealed 16.8.   12/11/2022 Tumor Marker   Patient's tumor was tested for the following markers: CA-125. Results of the tumor marker test revealed 12.5.   12/14/2022 Imaging   CT ABDOMEN PELVIS W CONTRAST  Result Date: 12/13/2022 CLINICAL DATA:  Ovarian cancer restaging * Tracking Code: BO * EXAM: CT ABDOMEN AND PELVIS WITH CONTRAST TECHNIQUE: Multidetector CT imaging of the abdomen and pelvis was performed using the standard protocol following bolus administration of intravenous contrast. RADIATION DOSE REDUCTION: This exam was performed according to the departmental dose-optimization program which includes automated exposure control, adjustment of the mA and/or kV according to patient size and/or use of iterative reconstruction technique. CONTRAST:  OMNIPAQUE IOHEXOL 300 MG/ML  SOLN COMPARISON:  08/02/2022 FINDINGS: Lower chest: Unremarkable Hepatobiliary: Unremarkable Pancreas:  Unremarkable Spleen: Unremarkable Adrenals/Urinary Tract: 2.6 by 2.1 cm right adrenal mass, internal density 55 Hounsfield units on portal venous phase images, relative washout of 13% which is indeterminate. Back in 2016 I measure this lesion at 2.2 by 1.6 cm, on the more recent comparison of 07/18/2022 this lesion measured 2.6 by 2.1 cm similar to today. Accordingly this lesion is stable from March and only very minimally increased in size from 2016, most likely benign. This can likely be surveilled in the context of the patient's ongoing oncology CT examinations. 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 The ureters transition to relatively normal caliber in the vicinity of the iliac vessel cross over, a specific cause for the transition is not identified. There is also a duplicated left renal collecting system without hydroureter on the left Stable 7 mm calcification or stone in the vicinity of the left ureterovesical junction. Stomach/Bowel: Prominent stool throughout the colon favors constipation. Anastomotic staple line in the sigmoid colon. No dilated small bowel. Interval appendectomy. Previous nodularity along bowel including the sigmoid colon no longer identified. Vascular/Lymphatic: Atherosclerosis is present, including aortoiliac atherosclerotic disease. No pathologic adenopathy. Reproductive: Interval hysterectomy with salpingo-oophorectomy. Other: Interval omentectomy. No current compelling findings of peritoneal tumor deposits. No ascites. Musculoskeletal: Prior fusion at L4-L5-S1. IMPRESSION: 1. Interval hysterectomy, omentectomy, and appendectomy. No compelling findings of recurrent or residual peritoneal tumor deposits. 2. 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 A specific cause  for the transition in caliber is not identified. 3. Stable 7 mm calcification or stone in the vicinity of the left ureterovesical junction. Duplicated left renal collecting system. 4. Prominent stool throughout the colon favors constipation. 5. Chronically stable but otherwise nonspecific right adrenal mass, likely benign. 6. Aortic atherosclerosis. Aortic Atherosclerosis (ICD10-I70.0). Electronically Signed   By: Gaylyn Rong M.D.   On: 12/13/2022 15:16      12/20/2022 Procedure   Successful removal of a RIGHT chest implanted Port-A-Cath.    03/19/2023 Tumor Marker   Patient's tumor was tested for the following markers: CA-125. Results of the tumor marker test revealed 19.7.   06/14/2023 Imaging   CT ABDOMEN PELVIS W CONTRAST Result Date: 06/21/2023 CLINICAL DATA:  Restaging ovarian cancer.  * Tracking Code: BO * EXAM: CT ABDOMEN AND PELVIS WITH CONTRAST TECHNIQUE: Multidetector CT imaging of the abdomen and pelvis was performed using the standard protocol following bolus administration of intravenous contrast. RADIATION DOSE REDUCTION: This exam was performed according to the departmental dose-optimization program which includes automated exposure control, adjustment of the mA and/or kV according to patient size and/or use of iterative reconstruction technique. CONTRAST:  OMNIPAQUE IOHEXOL 300 MG/ML  SOLN COMPARISON:  CT abdomen and pelvis dated 12/08/2022 FINDINGS: Lower chest: No focal consolidation or pulmonary nodule in the lung bases. No pleural effusion or pneumothorax demonstrated. Partially imaged heart size is normal. Hepatobiliary: No focal hepatic lesions. No intra or extrahepatic biliary ductal dilation. Normal gallbladder. Pancreas: No focal lesions or main ductal dilation. Spleen: Normal in size without focal abnormality. Adrenals/Urinary Tract: Unchanged 2.0 cm right adrenal nodule measuring 43 HU (2:16). No left adrenal  nodule. 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 Duplex left kidney. No hydronephrosis. Unchanged 7 mm calcification adjacent to the left ureterovesical junction. No focal bladder wall thickening. Stomach/Bowel: Normal appearance of the stomach. Postsurgical changes from rectosigmoid resection with patent anastomosis. No evidence of bowel wall thickening, distention, or inflammatory changes. Appendectomy. Vascular/Lymphatic: Aortic atherosclerosis. New and increased size of multi station lymphadenopathy and peritoneal nodules, for example: -12 mm cardiophrenic (2:2), previously partially imaged at 3 mm -new 12 mm midline epigastric anterior peritoneal (2:23) -new 3.1 x 2.9 cm peripancreatic (2:25) -14 mm right pelvic sidewall (2:56), previously 4 mm -11 mm right external iliac (2:60), previously 7 mm Reproductive: Status post hysterectomy and bilateral salpingo-oophorectomy. No adnexal masses. Increased lobulated soft tissue density adjacent to the right vaginal cuff measuring 2.9 x 1.8 cm (2:64), previously 2.4 x 1.7 cm Other: Status post total omentectomy. No free fluid, fluid collection, or free air. Musculoskeletal: No acute or abnormal lytic or blastic osseous lesions. Postsurgical changes of L4-S1 spinal fixation. Hardware appears intact. Postsurgical changes of the anterior abdominal wall. IMPRESSION: 1. New and increased size of multi station lymphadenopathy and peritoneal nodules, consistent with metastatic disease. 2. Increased lobulated soft tissue density adjacent to the right vaginal cuff measuring 2.9 x 1.8 cm, previously 2.4 x 1.7 cm, suspicious for recurrent disease. 3. Unchanged 2.0 cm right adrenal nodule, favored benign. Recommend continued attention on follow-up. 4. 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 Duplex left kidney. No hydronephrosis. Unchanged 7 mm calcification adjacent to the left ureterovesical junction. 5.  Aortic Atherosclerosis (ICD10-I70.0).  Electronically Signed   By: Agustin Cree M.D.   On: 06/21/2023 09:35      06/18/2023 Tumor Marker   Patient's tumor was tested for the following markers: CA-125. Results of the tumor marker test revealed 32.9.     PHYSICAL EXAMINATION:  ECOG PERFORMANCE STATUS: 1 - Symptomatic but completely ambulatory  Vitals:   06/21/23 0917  BP: (!) 150/87  Pulse: 78  Resp: 18  Temp: (!) 97.4 F (36.3 C)  SpO2: 100%   Filed Weights   06/21/23 0917  Weight: 188 lb 6.4 oz (85.5 kg)    GENERAL:alert, no distress and comfortable  NEURO: alert & oriented x 3 with fluent speech, no focal motor/sensory deficits  LABORATORY DATA:  I have reviewed the data as listed    Component Value Date/Time   NA 139 06/15/2023 0922   K 3.9 06/15/2023 0922   CL 104 06/15/2023 0922   CO2 30 06/15/2023 0922   GLUCOSE 130 (H) 06/15/2023 0922   BUN 17 06/15/2023 0922   CREATININE 0.91 06/15/2023 0922   CREATININE 1.01 (H) 12/08/2022 0822   CALCIUM 9.5 06/15/2023 0922   PROT 7.7 06/15/2023 0922   ALBUMIN 4.5 06/15/2023 0922   AST 19 06/15/2023 0922   AST 18 12/08/2022 0822   ALT 21 06/15/2023 0922   ALT 22 12/08/2022 0822   ALKPHOS 94 06/15/2023 0922   BILITOT 0.4 06/15/2023 0922   BILITOT 0.3 12/08/2022 0822   GFRNONAA >60 06/15/2023 0922   GFRNONAA >60 12/08/2022 0822   GFRAA >60 08/20/2019 1408    No results found for: "SPEP", "UPEP"  Lab Results  Component Value Date   WBC 4.3 06/15/2023   NEUTROABS 2.2 06/15/2023   HGB 15.3 (H) 06/15/2023   HCT 44.9 06/15/2023   MCV 100.2 (H) 06/15/2023   PLT 289 06/15/2023      Chemistry      Component Value Date/Time   NA 139 06/15/2023 0922   K 3.9 06/15/2023 0922   CL 104 06/15/2023 0922   CO2 30 06/15/2023 0922   BUN 17 06/15/2023 0922   CREATININE 0.91 06/15/2023 0922   CREATININE 1.01 (H) 12/08/2022 0822      Component Value Date/Time   CALCIUM 9.5 06/15/2023 0922   ALKPHOS 94 06/15/2023 0922   AST 19 06/15/2023 0922   AST 18  12/08/2022 0822   ALT 21 06/15/2023 0922   ALT 22 12/08/2022 0822   BILITOT 0.4 06/15/2023 0922   BILITOT 0.3 12/08/2022 5409       RADIOGRAPHIC STUDIES: I have reviewed imaging study with the patient I have personally reviewed the radiological images as listed and agreed with the findings in the report. CT ABDOMEN PELVIS W CONTRAST Result Date: 06/21/2023 CLINICAL DATA:  Restaging ovarian cancer.  * Tracking Code: BO * EXAM: CT ABDOMEN AND PELVIS WITH CONTRAST TECHNIQUE: Multidetector CT imaging of the abdomen and pelvis was performed using the standard protocol following bolus administration of intravenous contrast. RADIATION DOSE REDUCTION: This exam was performed according to the departmental dose-optimization program which includes automated exposure control, adjustment of the mA and/or kV according to patient size and/or use of iterative reconstruction technique. CONTRAST:  OMNIPAQUE IOHEXOL 300 MG/ML  SOLN COMPARISON:  CT abdomen and pelvis dated 12/08/2022 FINDINGS: Lower chest: No focal consolidation or pulmonary nodule in the lung bases. No pleural effusion or pneumothorax demonstrated. Partially imaged heart size is normal. Hepatobiliary: No focal hepatic lesions. No intra or extrahepatic biliary ductal dilation. Normal gallbladder. Pancreas: No focal lesions or main ductal dilation. Spleen: Normal in size without focal abnormality. Adrenals/Urinary Tract: Unchanged 2.0 cm right adrenal nodule measuring 43 HU (2:16). No left adrenal nodule. 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 Duplex left kidney. No hydronephrosis. Unchanged 7 mm calcification adjacent to  the left ureterovesical junction. No focal bladder wall thickening. Stomach/Bowel: Normal appearance of the stomach. Postsurgical changes from rectosigmoid resection with patent anastomosis. No evidence of bowel wall thickening, distention, or inflammatory changes. Appendectomy. Vascular/Lymphatic: Aortic  atherosclerosis. New and increased size of multi station lymphadenopathy and peritoneal nodules, for example: -12 mm cardiophrenic (2:2), previously partially imaged at 3 mm -new 12 mm midline epigastric anterior peritoneal (2:23) -new 3.1 x 2.9 cm peripancreatic (2:25) -14 mm right pelvic sidewall (2:56), previously 4 mm -11 mm right external iliac (2:60), previously 7 mm Reproductive: Status post hysterectomy and bilateral salpingo-oophorectomy. No adnexal masses. Increased lobulated soft tissue density adjacent to the right vaginal cuff measuring 2.9 x 1.8 cm (2:64), previously 2.4 x 1.7 cm Other: Status post total omentectomy. No free fluid, fluid collection, or free air. Musculoskeletal: No acute or abnormal lytic or blastic osseous lesions. Postsurgical changes of L4-S1 spinal fixation. Hardware appears intact. Postsurgical changes of the anterior abdominal wall. IMPRESSION: 1. New and increased size of multi station lymphadenopathy and peritoneal nodules, consistent with metastatic disease. 2. Increased lobulated soft tissue density adjacent to the right vaginal cuff measuring 2.9 x 1.8 cm, previously 2.4 x 1.7 cm, suspicious for recurrent disease. 3. Unchanged 2.0 cm right adrenal nodule, favored benign. Recommend continued attention on follow-up. 4. 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 Duplex left kidney. No hydronephrosis. Unchanged 7 mm calcification adjacent to the left ureterovesical junction. 5.  Aortic Atherosclerosis (ICD10-I70.0). Electronically Signed   By: Agustin Cree M.D.   On: 06/21/2023 09:35

## 2023-06-21 NOTE — Assessment & Plan Note (Signed)
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

## 2023-06-22 DIAGNOSIS — G35 Multiple sclerosis: Secondary | ICD-10-CM | POA: Diagnosis not present

## 2023-06-22 DIAGNOSIS — M792 Neuralgia and neuritis, unspecified: Secondary | ICD-10-CM | POA: Diagnosis not present

## 2023-06-22 DIAGNOSIS — C799 Secondary malignant neoplasm of unspecified site: Secondary | ICD-10-CM | POA: Diagnosis not present

## 2023-06-25 ENCOUNTER — Encounter: Payer: Self-pay | Admitting: Hematology and Oncology

## 2023-06-25 ENCOUNTER — Other Ambulatory Visit: Payer: Self-pay | Admitting: Hematology and Oncology

## 2023-06-25 ENCOUNTER — Telehealth: Payer: Self-pay

## 2023-06-25 DIAGNOSIS — C563 Malignant neoplasm of bilateral ovaries: Secondary | ICD-10-CM

## 2023-06-25 MED ORDER — LIDOCAINE-PRILOCAINE 2.5-2.5 % EX CREA
TOPICAL_CREAM | CUTANEOUS | 3 refills | Status: AC
Start: 2023-06-25 — End: ?

## 2023-06-25 MED ORDER — ONDANSETRON HCL 8 MG PO TABS: 8.0000 mg | ORAL_TABLET | Freq: Three times a day (TID) | ORAL | 1 refills | Status: DC | PRN

## 2023-06-25 MED ORDER — PROCHLORPERAZINE MALEATE 10 MG PO TABS: 10.0000 mg | ORAL_TABLET | Freq: Four times a day (QID) | ORAL | 1 refills | Status: DC | PRN

## 2023-06-25 NOTE — Telephone Encounter (Signed)
 I placed port order and created a chemo plan to start 2/26, will send LOS

## 2023-06-25 NOTE — Telephone Encounter (Signed)
 Returned her call. She has decided to get carboplatin/gemcitabine/bevacizumab treatment option and would like to get a port placed.

## 2023-06-25 NOTE — Progress Notes (Signed)
 Marland Kitchen

## 2023-06-25 NOTE — Progress Notes (Signed)
 DISCONTINUE ON PATHWAY REGIMEN - Ovarian     A cycle is every 21 days:     Paclitaxel      Carboplatin   **Always confirm dose/schedule in your pharmacy ordering system**  PRIOR TREATMENT: [id number]: Carboplatin AUC=6 + Paclitaxel 175 mg/m2 q21 Days x 2-4 Cycles  START OFF PATHWAY REGIMEN - Ovarian   [id number]:Bevacizumab 15 mg/kg D1 + Carboplatin AUC=4 D1 + Gemcitabine 1,000 mg/m2 D1,8 q21 Days:   A cycle is every 21 days:     Bevacizumab-xxxx      Gemcitabine      Carboplatin   **Always confirm dose/schedule in your pharmacy ordering system**  Patient Characteristics: Recurrent or Progressive Disease, Second Line, Platinum Sensitive and ? 6 Months Since Last Therapy, Not a Candidate for Secondary Debulking Surgery BRCA Mutation Status: Awaiting Test Results Therapeutic Status: Recurrent or Progressive Disease Line of Therapy: Second Line Intent of Therapy: Non-Curative / Palliative Intent, Discussed with Patient

## 2023-06-26 ENCOUNTER — Telehealth: Payer: Self-pay | Admitting: Hematology and Oncology

## 2023-06-26 ENCOUNTER — Other Ambulatory Visit: Payer: Self-pay

## 2023-06-26 NOTE — Progress Notes (Signed)
 Pharmacist Chemotherapy Monitoring - Initial Assessment    Anticipated start date: 07/04/23   The following has been reviewed per standard work regarding the patient's treatment regimen: The patient's diagnosis, treatment plan and drug doses, and organ/hematologic function Lab orders and baseline tests specific to treatment regimen  The treatment plan start date, drug sequencing, and pre-medications Prior authorization status  Patient's documented medication list, including drug-drug interaction screen and prescriptions for anti-emetics and supportive care specific to the treatment regimen The drug concentrations, fluid compatibility, administration routes, and timing of the medications to be used The patient's access for treatment and lifetime cumulative dose history, if applicable  The patient's medication allergies and previous infusion related reactions, if applicable   Changes made to treatment plan:  N/A  Follow up needed:  Pending authorization for treatment  and port placement   Drusilla Kanner, PharmD, MBA

## 2023-06-26 NOTE — Telephone Encounter (Signed)
 Patient is aware of rescheduled appointment times/dates for infusion as well as follow up

## 2023-06-27 ENCOUNTER — Other Ambulatory Visit (HOSPITAL_COMMUNITY): Payer: Self-pay

## 2023-07-02 ENCOUNTER — Other Ambulatory Visit: Payer: Self-pay | Admitting: Radiology

## 2023-07-02 NOTE — H&P (Signed)
 Chief Complaint: bilateral primary ovarian cancer - image guided port a catheter placement   Referring Provider(s): Artis Delay  Supervising Physician: Marliss Coots  Patient Status: [facility] - Out-pt  History of Present Illness: [name] is a 68 y.o. female with a history of depression, endometriosis, GERD, syncope, hypertension, multiple sclerosis, and hepatitis C.  Pt is with recurrent bilateral primary ovarian cancer that was initially diagnosed from a CT A/P on 04/11/22 revealing a left adnexal mass.  She followed with Gyn Onc Dr. Pricilla Holm and underwent chemotherapy treatment through 12/2022.  She was undergoing surveillance with Dr. Bertis Ruddy and relapsed on 06/21/23. Pt is known to [place] from previous right port palcement 05/18/22 with Dr. [name]. Pt will start chemotherapy on 07/04/23 and was referred to interventional radiology for image guided port a catheter placement.   *** Patient is Full Code  Past Medical History:  Diagnosis Date   Anxiety    Asthma    Back problem    disc disease, chronic low back pain, right leg pain, s/p discectomy 99   Cancer (HCC)    ovarian cancer   Depression    Diastolic dysfunction    with elevated LVEDP at cath   Endometriosis    Fibroids    hx of    GERD (gastroesophageal reflux disease)    Heart murmur    Hepatitis C    treated 8-9 years ago   Hidradenitis suppurativa    History of kidney stones    Hypertension    MS (multiple sclerosis) (HCC)    Neuromuscular disorder (HCC)    Mulitple sclerosis   Osteoarthritis    Pre-diabetes    Prediabetes    RA (rheumatoid arthritis) (HCC)    Rotator cuff tear    right shoulder    Syncope     Past Surgical History:  Procedure Laterality Date   ANGIOPLASTY     BACK SURGERY     lumbar surgery x 2 done in [place] and [place]   COLON RESECTION SIGMOID  08/16/2022   Procedure: COLON RESECTION SIGMOID;  Surgeon: Carver Fila, MD;  Location: [facility];  Service: Gynecology;;    COLONOSCOPY  07/2010   tubular adenoma   COLONOSCOPY  08/2015   CYSTOSCOPY N/A 08/16/2022   Procedure: CYSTOSCOPY;  Surgeon: Carver Fila, MD;  Location: [facility];  Service: Gynecology;  Laterality: N/A;   DISKECTOMY     HYSTERECTOMY ABDOMINAL WITH SALPINGO-OOPHORECTOMY  08/16/2022   Procedure: HYSTERECTOMY ABDOMINAL WITH SALPINGO-OOPHORECTOMY;  Surgeon: Carver Fila, MD;  Location: [facility];  Service: Gynecology;;   IR IMAGING GUIDED PORT INSERTION  05/18/2022   IR REMOVAL TUN ACCESS W/ PORT W/O FL MOD SED  12/20/2022   IR US GUIDE BX ASP/DRAIN  05/18/2022   LAPAROSCOPY N/A 08/16/2022   Procedure: LAPAROSCOPY DIAGNOSTIC;  Surgeon: Carver Fila, MD;  Location: [facility];  Service: Gynecology;  Laterality: N/A;   laparoscopy for endometriosis     left shoulder scope     RADIOLOGY WITH ANESTHESIA N/A 05/18/2022   Procedure: IR WITH ANESTHESIA PORT AND BIOPSY;  Surgeon: Irish Lack, MD;  Location: [facility];  Service: Radiology;  Laterality: N/A;   ROTATOR CUFF REPAIR Left    Spinal Fusion  12/2016   L4-S1    Allergies: Cymbalta [duloxetine hcl], Cyclobenzaprine hcl, Celexa  [citalopram hydrobromide], Fluticasone furoate-vilanterol, Gabapentin, Opana  [oxymorphone hcl], and Tramadol  Medications: Prior to Admission medications   Medication Sig Start Date End Date  Taking? Authorizing Provider  albuterol (VENTOLIN HFA) 108 (90 Base) MCG/ACT inhaler Inhale 2 puffs into the lungs every 6 (six) hours as needed for shortness of breath. 07/30/21   [provider]  amphetamine-dextroamphetamine (ADDERALL) 20 MG tablet Take 10-20 mg by mouth See admin instructions. Take 20 mg by mouth in the morning and 10 mg between 1-2 PM daily    [provider]  Ascorbic Acid (VITAMIN C) 1000 MG tablet Take 1,000 mg by mouth daily.    [provider]  ASHWAGANDHA PO Take 1 capsule by mouth daily.    [provider]  BLACK CURRANT SEED OIL PO Take 1 capsule by mouth  daily.    [provider]  diclofenac Sodium (VOLTAREN) 1 % GEL Apply 2 g topically 2 (two) times daily. 08/12/19   [provider]  irbesartan (AVAPRO) 300 MG tablet Take 300 mg by mouth daily. 12/08/22   [provider]  lidocaine-prilocaine (EMLA) cream Apply to affected area once 06/25/23   Artis Delay, MD  LORazepam (ATIVAN) 0.5 MG tablet Take 0.5 mg by mouth 3 (three) times daily as needed for anxiety.    [provider]  Melatonin 10 MG TABS Take 10 mg by mouth at bedtime as needed (sleep).    [provider]  Multiple Vitamin (MULTIVITAMIN WITH MINERALS) TABS tablet Take 1 tablet by mouth daily.    [provider]  mupirocin ointment (BACTROBAN) 2 % Apply 1 Application topically 3 (three) times daily as needed (HS flares). 08/02/21   [provider]  naloxone [facility]) nasal spray 4 mg/0.1 mL Place 1 spray into the nose as needed (opioid overdose).    [provider]  omeprazole (PRILOSEC) 20 MG capsule Take 20 mg by mouth daily as needed. 01/08/23   [provider]  ondansetron (ZOFRAN) 8 MG tablet Take 1 tablet (8 mg total) by mouth every 8 (eight) hours as needed for nausea or vomiting. Start on the third day after chemotherapy. 06/25/23   Artis Delay, MD  OVER THE COUNTER MEDICATION Take 2 capsules by mouth daily. [place] advanced supplement    [provider]  oxyCODONE ER (XTAMPZA ER) 13.5 MG C12A Take 13.5 mg by mouth in the morning, at noon, and at bedtime.    [provider]  Oxycodone HCl 20 MG TABS Take 20 mg by mouth 5 (five) times daily as needed (pain).    [provider]  polyethylene glycol (MIRALAX / GLYCOLAX) 17 g packet Take 17 g by mouth daily.    [provider]  prochlorperazine (COMPAZINE) 10 MG tablet Take 1 tablet (10 mg total) by mouth every 6 (six) hours as needed for nausea or vomiting. 06/25/23   Artis Delay, MD  Vitamin D, Ergocalciferol, (DRISDOL) 1.25 MG  (50000 UNIT) CAPS capsule Take 50,000 Units by mouth every Sunday.    [provider]  Monte Fantasia INHUB 250-50 MCG/ACT AEPB Inhale 1 puff into the lungs daily.    [provider]     Family History  Problem Relation Age of Onset   Heart disease Mother        Died with MI 16, chest pain 30s   Hypertension Mother    Pancreatitis Father    Atrial fibrillation Sister    Breast cancer Maternal Aunt        dx <50   Stomach cancer Maternal Grandfather        mets to liver? dx after 50   Multiple sclerosis  Cousin    Breast cancer Cousin        mat female cousin; dx unknown age   Colon cancer Cousin 10       mat female cousin; mets   Leukemia Cousin 35       mat female cousin    Social History   Socioeconomic History   Marital status: Single    Spouse name: Not on file   Number of children: 1   Years of education: Not on file   Highest education level: Not on file  Occupational History   Not on file  Tobacco Use   Smoking status: Some Days    Current packs/day: 0.00    Average packs/day: 0.1 packs/day for 27.0 years (2.7 ttl pk-yrs)    Types: Cigarettes    Start date: 15    Last attempt to quit: 2023    Years since quitting: 2.1   Smokeless tobacco: Never  Vaping Use   Vaping status: Never Used  Substance and Sexual Activity   Alcohol use: Yes    Comment: Occasional    Drug use: Yes    Types: Marijuana   Sexual activity: Yes  Other Topics Concern   Not on file  Social History Narrative   Lives alone   Right handed   Caffeine: none    Social Drivers of Corporate investment banker Strain: Not on file  Food Insecurity: No Food Insecurity (08/17/2022)   Hunger Vital Sign    Worried About Running Out of Food in the Last Year: Never true    Ran Out of Food in the Last Year: Never true  Transportation Needs: No Transportation Needs (08/17/2022)   PRAPARE - Administrator, Civil Service (Medical): No    Lack of Transportation (Non-Medical): No   Physical Activity: Not on file  Stress: Not on file  Social Connections: Not on file     Review of Systems: A 12 point ROS discussed and pertinent positives are indicated in the HPI above.  All other systems are negative.  Review of Systems  Vital Signs: There were no vitals taken for this visit.  Advance Care Plan: The advanced care place/surrogate decision maker was discussed at the time of visit and the patient did not wish to discuss or was not able to name a surrogate decision maker or provide an advance care plan.  Physical Exam  Imaging: CT ABDOMEN PELVIS W CONTRAST Result Date: 06/21/2023 CLINICAL DATA:  Restaging ovarian cancer.  * Tracking Code: BO * EXAM: CT ABDOMEN AND PELVIS WITH CONTRAST TECHNIQUE: Multidetector CT imaging of the abdomen and pelvis was performed using the standard protocol following bolus administration of intravenous contrast. RADIATION DOSE REDUCTION: This exam was performed according to the departmental dose-optimization program which includes automated exposure control, adjustment of the mA and/or kV according to patient size and/or use of iterative reconstruction technique. CONTRAST:  OMNIPAQUE IOHEXOL 300 MG/ML  SOLN COMPARISON:  CT abdomen and pelvis dated 12/08/2022 FINDINGS: Lower chest: No focal consolidation or pulmonary nodule in the lung bases. No pleural effusion or pneumothorax demonstrated. Partially imaged heart size is normal. Hepatobiliary: No focal hepatic lesions. No intra or extrahepatic biliary ductal dilation. Normal gallbladder. Pancreas: No focal lesions or main ductal dilation. Spleen: Normal in size without focal abnormality. Adrenals/Urinary Tract: Unchanged 2.0 cm right adrenal nodule measuring 43 HU (2:16). No left adrenal nodule. 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 Duplex left kidney. No hydronephrosis. Unchanged 7 mm calcification  adjacent to the left ureterovesical junction. No focal bladder wall  thickening. Stomach/Bowel: Normal appearance of the stomach. Postsurgical changes from rectosigmoid resection with patent anastomosis. No evidence of bowel wall thickening, distention, or inflammatory changes. Appendectomy. Vascular/Lymphatic: Aortic atherosclerosis. New and increased size of multi station lymphadenopathy and peritoneal nodules, for example: -12 mm cardiophrenic (2:2), previously partially imaged at 3 mm -new 12 mm midline epigastric anterior peritoneal (2:23) -new 3.1 x 2.9 cm peripancreatic (2:25) -14 mm right pelvic sidewall (2:56), previously 4 mm -11 mm right external iliac (2:60), previously 7 mm Reproductive: Status post hysterectomy and bilateral salpingo-oophorectomy. No adnexal masses. Increased lobulated soft tissue density adjacent to the right vaginal cuff measuring 2.9 x 1.8 cm (2:64), previously 2.4 x 1.7 cm Other: Status post total omentectomy. No free fluid, fluid collection, or free air. Musculoskeletal: No acute or abnormal lytic or blastic osseous lesions. Postsurgical changes of L4-S1 spinal fixation. Hardware appears intact. Postsurgical changes of the anterior abdominal wall. IMPRESSION: 1. New and increased size of multi station lymphadenopathy and peritoneal nodules, consistent with metastatic disease. 2. Increased lobulated soft tissue density adjacent to the right vaginal cuff measuring 2.9 x 1.8 cm, previously 2.4 x 1.7 cm, suspicious for recurrent disease. 3. Unchanged 2.0 cm right adrenal nodule, favored benign. Recommend continued attention on follow-up. 4. 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 Duplex left kidney. No hydronephrosis. Unchanged 7 mm calcification adjacent to the left ureterovesical junction. 5.  Aortic Atherosclerosis (ICD10-I70.0). Electronically Signed   By: Agustin Cree M.D.   On: 06/21/2023 09:35    Labs:  CBC: Recent Labs    11/03/22 0818 12/08/22 0822 03/16/23 1328 06/15/23 0922  WBC 2.8* 4.3 4.5 4.3  HGB 12.5 12.0  15.4* 15.3*  HCT 36.2 34.3* 41.8 44.9  PLT 269 211 279 289    COAGS: No results for input(s): "INR", "APTT" in the last 8760 hours.  BMP: Recent Labs    11/03/22 0818 12/08/22 0822 03/16/23 1328 06/15/23 0922  NA 137 140 140 139  K 4.1 3.5 4.2 3.9  CL 103 107 107 104  CO2 26 27 27 30   GLUCOSE 151* 105* 115* 130*  BUN 20 25* 14 17  CALCIUM 9.7 9.1 9.7 9.5  CREATININE 0.78 1.01* 0.88 0.91  GFRNONAA >60 >60 >60 >60    LIVER FUNCTION TESTS: Recent Labs    11/03/22 0818 12/08/22 0822 03/16/23 1328 06/15/23 0922  BILITOT 0.6 0.3 0.4 0.4  AST 29 18 19 19   ALT 30 22 22 21   ALKPHOS 92 79 87 94  PROT 7.6 6.9 7.4 7.7  ALBUMIN 4.2 4.2 4.3 4.5    TUMOR MARKERS: No results for input(s): "AFPTM", "CEA", "CA199", "CHROMGRNA" in the last 8760 hours.  Assessment and Plan:  Pt with recurrent bilateral primary ovarian cancer scheduled for image guided port a catheter placement 07/03/23.    Previous right port placement 05/18/22 with Dr. [name].    Risks and benefits of image guided port-a-catheter placement was discussed with the patient including, but not limited to bleeding, infection, pneumothorax, or fibrin sheath development and need for additional procedures.  All of the patient's questions were answered, patient is agreeable to proceed. Consent signed and in chart.  Thank you for allowing our service to participate in KALIYAH GLADMAN 's care.  Electronically Signed: Loman Brooklyn, PA-C   07/02/2023, 11:14 AM    I spent a total of    15 Minutes in face to face in clinical consultation, greater than 50%  of which was counseling/coordinating care for image guided port a catheter placement.

## 2023-07-03 ENCOUNTER — Ambulatory Visit (HOSPITAL_COMMUNITY)
Admission: RE | Admit: 2023-07-03 | Discharge: 2023-07-03 | Disposition: A | Discharge status: Home / Self Care | Payer: 59 | Source: Ambulatory Visit | Attending: Hematology and Oncology

## 2023-07-03 ENCOUNTER — Encounter (HOSPITAL_COMMUNITY): Payer: Self-pay

## 2023-07-03 ENCOUNTER — Ambulatory Visit (HOSPITAL_COMMUNITY)
Admission: RE | Admit: 2023-07-03 | Discharge: 2023-07-03 | Disposition: A | Discharge status: Home / Self Care | Payer: 59 | Source: Ambulatory Visit | Attending: Hematology and Oncology | Admitting: Hematology and Oncology

## 2023-07-03 ENCOUNTER — Other Ambulatory Visit: Payer: Self-pay

## 2023-07-03 DIAGNOSIS — I10 Essential (primary) hypertension: Secondary | ICD-10-CM | POA: Diagnosis not present

## 2023-07-03 DIAGNOSIS — G35 Multiple sclerosis: Secondary | ICD-10-CM | POA: Insufficient documentation

## 2023-07-03 DIAGNOSIS — F17210 Nicotine dependence, cigarettes, uncomplicated: Secondary | ICD-10-CM | POA: Diagnosis not present

## 2023-07-03 DIAGNOSIS — F32A Depression, unspecified: Secondary | ICD-10-CM | POA: Diagnosis not present

## 2023-07-03 DIAGNOSIS — K219 Gastro-esophageal reflux disease without esophagitis: Secondary | ICD-10-CM | POA: Insufficient documentation

## 2023-07-03 DIAGNOSIS — C563 Malignant neoplasm of bilateral ovaries: Secondary | ICD-10-CM | POA: Insufficient documentation

## 2023-07-03 HISTORY — PX: IR IMAGING GUIDED PORT INSERTION: IMG5740

## 2023-07-03 MED ORDER — DIPHENHYDRAMINE HCL 50 MG/ML IJ SOLN
INTRAMUSCULAR | Status: AC
  Filled 2023-07-03: qty 1

## 2023-07-03 MED ORDER — MIDAZOLAM HCL 2 MG/2ML IJ SOLN
INTRAMUSCULAR | Status: AC | PRN
  Administered 2023-07-03 (×4): 1 mg via INTRAVENOUS

## 2023-07-03 MED ORDER — ONDANSETRON HCL 4 MG/2ML IJ SOLN
INTRAMUSCULAR | Status: AC
  Filled 2023-07-03: qty 2

## 2023-07-03 MED ORDER — HEPARIN SOD (PORK) LOCK FLUSH 100 UNIT/ML IV SOLN
500.0000 [IU] | Freq: Once | INTRAVENOUS | Status: AC
  Administered 2023-07-03: 500 [IU] via INTRAVENOUS

## 2023-07-03 MED ORDER — HYDROMORPHONE HCL 1 MG/ML IJ SOLN
INTRAMUSCULAR | Status: AC | PRN
  Administered 2023-07-03: 1 mg via INTRAVENOUS

## 2023-07-03 MED ORDER — LIDOCAINE-EPINEPHRINE 1 %-1:100000 IJ SOLN
20.0000 mL | Freq: Once | INTRAMUSCULAR | Status: AC
  Administered 2023-07-03: 20 mL via INTRADERMAL

## 2023-07-03 MED ORDER — FENTANYL CITRATE (PF) 100 MCG/2ML IJ SOLN
INTRAMUSCULAR | Status: AC
  Filled 2023-07-03: qty 4

## 2023-07-03 MED ORDER — FENTANYL CITRATE (PF) 100 MCG/2ML IJ SOLN
INTRAMUSCULAR | Status: AC | PRN
  Administered 2023-07-03 (×3): 50 ug via INTRAVENOUS

## 2023-07-03 MED ORDER — HYDROMORPHONE HCL 1 MG/ML IJ SOLN
INTRAMUSCULAR | Status: AC
  Filled 2023-07-03: qty 1

## 2023-07-03 MED ORDER — MIDAZOLAM HCL 2 MG/2ML IJ SOLN
INTRAMUSCULAR | Status: AC
  Filled 2023-07-03: qty 4

## 2023-07-03 MED ORDER — LIDOCAINE-EPINEPHRINE 1 %-1:100000 IJ SOLN
INTRAMUSCULAR | Status: AC
  Filled 2023-07-03: qty 1

## 2023-07-03 MED ORDER — ONDANSETRON HCL 4 MG/2ML IJ SOLN
INTRAMUSCULAR | Status: AC | PRN
  Administered 2023-07-03: 4 mg via INTRAVENOUS

## 2023-07-03 MED ORDER — HEPARIN SOD (PORK) LOCK FLUSH 100 UNIT/ML IV SOLN
INTRAVENOUS | Status: AC
  Filled 2023-07-03: qty 5

## 2023-07-03 MED ORDER — DIPHENHYDRAMINE HCL 50 MG/ML IJ SOLN
INTRAMUSCULAR | Status: AC | PRN
  Administered 2023-07-03: 50 mg via INTRAVENOUS

## 2023-07-03 MED ORDER — SODIUM CHLORIDE 0.9 % IV SOLN: INTRAVENOUS | Status: DC

## 2023-07-03 MED FILL — Fosaprepitant Dimeglumine For IV Infusion 150 MG (Base Eq): INTRAVENOUS | Qty: 5 | Status: AC

## 2023-07-03 NOTE — Procedures (Signed)
 Interventional Radiology Procedure Note  Procedure: Single Lumen Power Port Placement    Access:  Right internal jugular vein  Findings: Catheter tip positioned at cavoatrial junction. Port is ready for immediate use.   Complications: None  EBL: < 10 mL  Recommendations:  - Ok to shower in 24 hours - Do not submerge for 7 days - Routine line care    Marliss Coots, MD

## 2023-07-03 NOTE — Discharge Instructions (Signed)
 For questions /concerns may call Interventional Radiology at [phone] or  Interventional Radiology clinic [phone]   You may remove your dressing and shower tomorrow afternoon  DO NOT use EMLA cream for 2 weeks after port placement as the cream will remove surgical glue on your incision.                                                                   Implanted Port Insertion, Care After The following information offers guidance on how to care for yourself after your procedure. Your health care provider may also give you more specific instructions. If you have problems or questions, contact your health care provider. What can I expect after the procedure? After the procedure, it is common to have: Discomfort at the port insertion site. Bruising on the skin over the port. This should improve over 3-4 days. Follow these instructions at home: [facility] care After your port is placed, you will get a manufacturer's information card. The card has information about your port. Keep this card with you at all times. Take care of the port as told by your health care provider. Ask your health care provider if you or a family member can get training for taking care of the port at home. A home health care nurse will be be available to help care for the port. Make sure to remember what type of port you have. Incision care  Follow instructions from your health care provider about how to take care of your port insertion site. Make sure you: Wash your hands with soap and water for at least 20 seconds before and after you change your bandage (dressing). If soap and water are not available, use hand sanitizer. Change your dressing as told by your health care provider. Leave stitches (sutures), skin glue, or adhesive strips in place. These skin closures may need to stay in place for 2 weeks or longer. If adhesive strip edges start to loosen and curl up, you may trim the loose edges. Do not remove adhesive  strips completely unless your health care provider tells you to do that. Check your port insertion site every day for signs of infection. Check for: Redness, swelling, or pain. Fluid or blood. Warmth. Pus or a bad smell. Activity Return to your normal activities as told by your health care provider. Ask your health care provider what activities are safe for you. You may have to avoid lifting. Ask your health care provider how much you can safely lift. General instructions Take over-the-counter and prescription medicines only as told by your health care provider. Do not take baths, swim, or use a hot tub until your health care provider approves. Ask your health care provider if you may take showers. You may only be allowed to take sponge baths. If you were given a sedative during the procedure, it can affect you for several hours. Do not drive or operate machinery until your health care provider says that it is safe. Wear a medical alert bracelet in case of an emergency. This will tell any health care providers that you have a port. Keep all follow-up visits. This is important. Contact a health care provider if: You cannot flush your port with saline as directed, or you cannot draw blood  from the port. You have a fever or chills. You have redness, swelling, or pain around your port insertion site. You have fluid or blood coming from your port insertion site. Your port insertion site feels warm to the touch. You have pus or a bad smell coming from the port insertion site. Get help right away if: You have chest pain or shortness of breath. You have bleeding from your port that you cannot control. These symptoms may be an emergency. Get help right away. Call 911. Do not wait to see if the symptoms will go away. Do not drive yourself to the hospital. Summary Take care of the port as told by your health care provider. Keep the manufacturer's information card with you at all times. Change your  dressing as told by your health care provider. Contact a health care provider if you have a fever or chills or if you have redness, swelling, or pain around your port insertion site. Keep all follow-up visits. This information is not intended to replace advice given to you by your health care provider. Make sure you discuss any questions you have with your health care provider. Document Revised: 10/26/2020 Document Reviewed: 10/26/2020 [employer] Patient Education  2024 [employer]                                                         Moderate Conscious Sedation, Adult, Care After After the procedure, it is common to have: Sleepiness for a few hours. Impaired judgment for a few hours. Trouble with balance. Nausea or vomiting if you eat too soon. Follow these instructions at home: For the time period you were told by your health care provider:  Rest. Do not participate in activities where you could fall or become injured. Do not drive or use machinery. Do not drink alcohol. Do not take sleeping pills or medicines that cause drowsiness. Do not make important decisions or sign legal documents. Do not take care of children on your own. Eating and drinking Follow instructions from your health care provider about what you may eat and drink. Drink enough fluid to keep your urine pale yellow. If you vomit: Drink clear fluids slowly and in small amounts as you are able. Clear fluids include water, ice chips, low-calorie sports drinks, and fruit juice that has water added to it (diluted fruit juice). Eat light and bland foods in small amounts as you are able. These foods include bananas, applesauce, rice, lean meats, toast, and crackers. General instructions Take over-the-counter and prescription medicines only as told by your health care provider. Have a responsible adult stay with you for the time you are told. Do not use any products that contain nicotine or tobacco. These products  include cigarettes, chewing tobacco, and vaping devices, such as e-cigarettes. If you need help quitting, ask your health care provider. Return to your normal activities as told by your health care provider. Ask your health care provider what activities are safe for you. Your health care provider may give you more instructions. Make sure you know what you can and cannot do. Contact a health care provider if: You are still sleepy or having trouble with balance after 24 hours. You feel light-headed. You vomit every time you eat or drink. You get a rash. You have a fever. You have redness  or swelling around the IV site. Get help right away if: You have trouble breathing. You start to feel confused at home. These symptoms may be an emergency. Get help right away. Call 911. Do not wait to see if the symptoms will go away. Do not drive yourself to the hospital. This information is not intended to replace advice given to you by your health care provider. Make sure you discuss any questions you have with your health care provider. Document Revised: 11/07/2021 Document Reviewed: 11/07/2021 [employer] Patient Education  2024 [employer].

## 2023-07-04 ENCOUNTER — Inpatient Hospital Stay: Payer: 59

## 2023-07-04 ENCOUNTER — Other Ambulatory Visit: Payer: Self-pay | Admitting: Hematology and Oncology

## 2023-07-04 VITALS — BP 158/99 | HR 81 | Temp 98.4°F | Resp 18

## 2023-07-04 DIAGNOSIS — Z5111 Encounter for antineoplastic chemotherapy: Secondary | ICD-10-CM | POA: Diagnosis not present

## 2023-07-04 DIAGNOSIS — Z95828 Presence of other vascular implants and grafts: Secondary | ICD-10-CM

## 2023-07-04 DIAGNOSIS — C563 Malignant neoplasm of bilateral ovaries: Secondary | ICD-10-CM

## 2023-07-04 DIAGNOSIS — C786 Secondary malignant neoplasm of retroperitoneum and peritoneum: Secondary | ICD-10-CM | POA: Diagnosis not present

## 2023-07-04 LAB — CMP (CANCER CENTER ONLY)
ALT: 26 U/L (ref 0–44)
AST: 19 U/L (ref 15–41)
Albumin: 4.2 g/dL (ref 3.5–5.0)
Alkaline Phosphatase: 82 U/L (ref 38–126)
Anion gap: 5 (ref 5–15)
BUN: 11 mg/dL (ref 8–23)
CO2: 28 mmol/L (ref 22–32)
Calcium: 9.5 mg/dL (ref 8.9–10.3)
Chloride: 104 mmol/L (ref 98–111)
Creatinine: 0.8 mg/dL (ref 0.44–1.00)
GFR, Estimated: >60 mL/min (ref >60)
Glucose, Bld: 173 mg/dL — ABNORMAL HIGH (ref 70–99)
Potassium: 3.6 mmol/L (ref 3.5–5.1)
Sodium: 137 mmol/L (ref 135–145)
Total Bilirubin: 0.5 mg/dL (ref 0.0–1.2)
Total Protein: 7.3 g/dL (ref 6.5–8.1)

## 2023-07-04 LAB — CBC WITH DIFFERENTIAL (CANCER CENTER ONLY)
Abs Immature Granulocytes: 0.01 10*3/uL (ref 0.00–0.07)
Basophils Absolute: 0 10*3/uL (ref 0.0–0.1)
Basophils Relative: 1 %
Eosinophils Absolute: 0.1 10*3/uL (ref 0.0–0.5)
Eosinophils Relative: 1 %
HCT: 41.5 % (ref 36.0–46.0)
Hemoglobin: 13.9 g/dL (ref 12.0–15.0)
Immature Granulocytes: 0 %
Lymphocytes Relative: 32 %
Lymphs Abs: 1.3 10*3/uL (ref 0.7–4.0)
MCH: 33.5 pg (ref 26.0–34.0)
MCHC: 33.5 g/dL (ref 30.0–36.0)
MCV: 100 fL (ref 80.0–100.0)
Monocytes Absolute: 0.4 10*3/uL (ref 0.1–1.0)
Monocytes Relative: 10 %
Neutro Abs: 2.4 10*3/uL (ref 1.7–7.7)
Neutrophils Relative %: 56 %
Platelet Count: 266 10*3/uL (ref 150–400)
RBC: 4.15 MIL/uL (ref 3.87–5.11)
RDW: 11.6 % (ref 11.5–15.5)
WBC Count: 4.3 10*3/uL (ref 4.0–10.5)
nRBC: 0 % (ref 0.0–0.2)

## 2023-07-04 LAB — TOTAL PROTEIN, URINE DIPSTICK: Protein, ur: NEGATIVE mg/dL

## 2023-07-04 MED ORDER — SODIUM CHLORIDE 0.9% FLUSH
10.0000 mL | INTRAVENOUS | Status: DC | PRN
  Administered 2023-07-04: 10 mL

## 2023-07-04 MED ORDER — FAMOTIDINE IN NACL 20-0.9 MG/50ML-% IV SOLN
20.0000 mg | Freq: Once | INTRAVENOUS | Status: AC
  Administered 2023-07-04: 20 mg via INTRAVENOUS
  Filled 2023-07-04: qty 50

## 2023-07-04 MED ORDER — SODIUM CHLORIDE 0.9 % IV SOLN
800.0000 mg/m2 | Freq: Once | INTRAVENOUS | Status: AC
  Administered 2023-07-04: 1634 mg via INTRAVENOUS
  Filled 2023-07-04: qty 42.97

## 2023-07-04 MED ORDER — SODIUM CHLORIDE 0.9 % IV SOLN
150.0000 mg | Freq: Once | INTRAVENOUS | Status: AC
  Administered 2023-07-04: 150 mg via INTRAVENOUS
  Filled 2023-07-04: qty 150

## 2023-07-04 MED ORDER — PALONOSETRON HCL INJECTION 0.25 MG/5ML
0.2500 mg | Freq: Once | INTRAVENOUS | Status: AC
  Administered 2023-07-04: 0.25 mg via INTRAVENOUS
  Filled 2023-07-04: qty 5

## 2023-07-04 MED ORDER — SODIUM CHLORIDE 0.9 % IV SOLN
402.8000 mg | Freq: Once | INTRAVENOUS | Status: AC
  Administered 2023-07-04: 400 mg via INTRAVENOUS
  Filled 2023-07-04: qty 40

## 2023-07-04 MED ORDER — CETIRIZINE HCL 10 MG/ML IV SOLN
10.0000 mg | Freq: Once | INTRAVENOUS | Status: AC
  Administered 2023-07-04: 10 mg via INTRAVENOUS
  Filled 2023-07-04: qty 1

## 2023-07-04 MED ORDER — SODIUM CHLORIDE 0.9 % IV SOLN: INTRAVENOUS | Status: DC

## 2023-07-04 MED ORDER — SODIUM CHLORIDE 0.9% FLUSH
10.0000 mL | Freq: Once | INTRAVENOUS | Status: AC
Start: 2023-07-04 — End: 2023-07-04
  Administered 2023-07-04: 10 mL

## 2023-07-04 MED ORDER — DEXAMETHASONE SODIUM PHOSPHATE 10 MG/ML IJ SOLN
10.0000 mg | Freq: Once | INTRAMUSCULAR | Status: AC
  Administered 2023-07-04: 10 mg via INTRAVENOUS
  Filled 2023-07-04: qty 1

## 2023-07-04 MED ORDER — HEPARIN SOD (PORK) LOCK FLUSH 100 UNIT/ML IV SOLN
500.0000 [IU] | Freq: Once | INTRAVENOUS | Status: AC | PRN
  Administered 2023-07-04: 500 [IU]

## 2023-07-04 MED ORDER — SODIUM CHLORIDE 0.9 % IV SOLN
15.0000 mg/kg | Freq: Once | INTRAVENOUS | Status: DC
Start: 2023-07-04 — End: 2023-07-04
  Filled 2023-07-04: qty 52

## 2023-07-04 NOTE — Patient Instructions (Signed)
 [facility] - A DEPT OF [facility]  Discharge Instructions: Thank you for choosing [facility] to provide your oncology and hematology care.   If you have a lab appointment with the Cancer Center, please go directly to the Cancer Center and check in at the registration area.   Wear comfortable clothing and clothing appropriate for easy access to any Portacath or PICC line.   We strive to give you quality time with your provider. You may need to reschedule your appointment if you arrive late (15 or more minutes).  Arriving late affects you and other patients whose appointments are after yours.  Also, if you miss three or more appointments without notifying the office, you may be dismissed from the clinic at the provider's discretion.      For prescription refill requests, have your pharmacy contact our office and allow 72 hours for refills to be completed.    Today you received the following chemotherapy and/or immunotherapy agents: CARBOplatin (PARAPLATIN) bevacizumab-awwb (MVASI), and gemcitabine (GEMZAR)    To help prevent nausea and vomiting after your treatment, we encourage you to take your nausea medication as directed.  BELOW ARE SYMPTOMS THAT SHOULD BE REPORTED IMMEDIATELY: *FEVER GREATER THAN 100.4 F (38 C) OR HIGHER *CHILLS OR SWEATING *NAUSEA AND VOMITING THAT IS NOT CONTROLLED WITH YOUR NAUSEA MEDICATION *UNUSUAL SHORTNESS OF BREATH *UNUSUAL BRUISING OR BLEEDING *URINARY PROBLEMS (pain or burning when urinating, or frequent urination) *BOWEL PROBLEMS (unusual diarrhea, constipation, pain near the anus) TENDERNESS IN MOUTH AND THROAT WITH OR WITHOUT PRESENCE OF ULCERS (sore throat, sores in mouth, or a toothache) UNUSUAL RASH, SWELLING OR PAIN  UNUSUAL VAGINAL DISCHARGE OR ITCHING   Items with * indicate a potential emergency and should be followed up as soon as possible or go to the Emergency Department if any problems should  occur.  Please show the CHEMOTHERAPY ALERT CARD or IMMUNOTHERAPY ALERT CARD at check-in to the Emergency Department and triage nurse.  Should you have questions after your visit or need to cancel or reschedule your appointment, please contact [facility] - A DEPT OF [facility]  Dept: [phone]  and follow the prompts.  Office hours are 8:00 a.m. to 4:30 p.m. Monday - Friday. Please note that voicemails left after 4:00 p.m. may not be returned until the following business day.  We are closed weekends and major holidays. You have access to a nurse at all times for urgent questions. Please call the main number to the clinic Dept: [phone] and follow the prompts.   For any non-urgent questions, you may also contact your provider using MyChart. We now offer e-Visits for anyone 65 and older to request care online for non-urgent symptoms. For details visit [email or web address].   Also download the MyChart app! Go to the app store, search "MyChart", open the app, select [facility], and log in with your MyChart username and password.  Carboplatin Injection What is this medication? CARBOPLATIN (KAR boe pla tin) treats some types of cancer. It works by slowing down the growth of cancer cells. This medicine may be used for other purposes; ask your health care provider or pharmacist if you have questions. COMMON BRAND NAME(S): Paraplatin What should I tell my care team before I take this medication? They need to know if you have any of these conditions: Blood disorders Hearing problems Kidney disease Recent or ongoing radiation therapy An unusual or allergic reaction  to carboplatin, cisplatin, other medications, foods, dyes, or preservatives Pregnant or trying to get pregnant Breast-feeding How should I use this medication? This medication is injected into a vein. It is given by your care team in a hospital or clinic setting. Talk to your care team about the  use of this medication in children. Special care may be needed. Overdosage: If you think you have taken too much of this medicine contact a poison control center or emergency room at once. NOTE: This medicine is only for you. Do not share this medicine with others. What if I miss a dose? Keep appointments for follow-up doses. It is important not to miss your dose. Call your care team if you are unable to keep an appointment. What may interact with this medication? Medications for seizures Some antibiotics, such as amikacin, gentamicin, neomycin, streptomycin, tobramycin Vaccines This list may not describe all possible interactions. Give your health care provider a list of all the medicines, herbs, non-prescription drugs, or dietary supplements you use. Also tell them if you smoke, drink alcohol, or use illegal drugs. Some items may interact with your medicine. What should I watch for while using this medication? Your condition will be monitored carefully while you are receiving this medication. You may need blood work while taking this medication. This medication may make you feel generally unwell. This is not uncommon, as chemotherapy can affect healthy cells as well as cancer cells. Report any side effects. Continue your course of treatment even though you feel ill unless your care team tells you to stop. In some cases, you may be given additional medications to help with side effects. Follow all directions for their use. This medication may increase your risk of getting an infection. Call your care team for advice if you get a fever, chills, sore throat, or other symptoms of a cold or flu. Do not treat yourself. Try to avoid being around people who are sick. Avoid taking medications that contain aspirin, acetaminophen, ibuprofen, naproxen, or ketoprofen unless instructed by your care team. These medications may hide a fever. Be careful brushing or flossing your teeth or using a toothpick because  you may get an infection or bleed more easily. If you have any dental work done, tell your dentist you are receiving this medication. Talk to your care team if you wish to become pregnant or think you might be pregnant. This medication can cause serious birth defects. Talk to your care team about effective forms of contraception. Do not breast-feed while taking this medication. What side effects may I notice from receiving this medication? Side effects that you should report to your care team as soon as possible: Allergic reactions--skin rash, itching, hives, swelling of the face, lips, tongue, or throat Infection--fever, chills, cough, sore throat, wounds that don't heal, pain or trouble when passing urine, general feeling of discomfort or being unwell Low red blood cell level--unusual weakness or fatigue, dizziness, headache, trouble breathing Pain, tingling, or numbness in the hands or feet, muscle weakness, change in vision, confusion or trouble speaking, loss of balance or coordination, trouble walking, seizures Unusual bruising or bleeding Side effects that usually do not require medical attention (report to your care team if they continue or are bothersome): Hair loss Nausea Unusual weakness or fatigue Vomiting This list may not describe all possible side effects. Call your doctor for medical advice about side effects. You may report side effects to FDA at 1-800-FDA-1088. Where should I keep my medication? This medication is given  in a hospital or clinic. It will not be stored at home. NOTE: This sheet is a summary. It may not cover all possible information. If you have questions about this medicine, talk to your doctor, pharmacist, or health care provider.  2024 Elsevier/Gold Standard (2021-08-16 00:00:00)  Bevacizumab Injection What is this medication? BEVACIZUMAB (be va SIZ yoo mab) treats some types of cancer. It works by blocking a protein that causes cancer cells to grow and  multiply. This helps to slow or stop the spread of cancer cells. It is a monoclonal antibody. This medicine may be used for other purposes; ask your health care provider or pharmacist if you have questions. COMMON BRAND NAME(S): Alymsys, Avastin, MVASI, [place] Lao What should I tell my care team before I take this medication? They need to know if you have any of these conditions: Blood clots Coughing up blood Having or recent surgery Heart failure High blood pressure History of a connection between 2 or more body parts that do not usually connect (fistula) History of a tear in your stomach or intestines Protein in your urine An unusual or allergic reaction to bevacizumab, other medications, foods, dyes, or preservatives Pregnant or trying to get pregnant Breast-feeding How should I use this medication? This medication is injected into a vein. It is given by your care team in a hospital or clinic setting. Talk to your care team the use of this medication in children. Special care may be needed. Overdosage: If you think you have taken too much of this medicine contact a poison control center or emergency room at once. NOTE: This medicine is only for you. Do not share this medicine with others. What if I miss a dose? Keep appointments for follow-up doses. It is important not to miss your dose. Call your care team if you are unable to keep an appointment. What may interact with this medication? Interactions are not expected. This list may not describe all possible interactions. Give your health care provider a list of all the medicines, herbs, non-prescription drugs, or dietary supplements you use. Also tell them if you smoke, drink alcohol, or use illegal drugs. Some items may interact with your medicine. What should I watch for while using this medication? Your condition will be monitored carefully while you are receiving this medication. You may need blood work while taking this  medication. This medication may make you feel generally unwell. This is not uncommon as chemotherapy can affect healthy cells as well as cancer cells. Report any side effects. Continue your course of treatment even though you feel ill unless your care team tells you to stop. This medication may increase your risk to bruise or bleed. Call your care team if you notice any unusual bleeding. Before having surgery, talk to your care team to make sure it is ok. This medication can increase the risk of poor healing of your surgical site or wound. You will need to stop this medication for 28 days before surgery. After surgery, wait at least 28 days before restarting this medication. Make sure the surgical site or wound is healed enough before restarting this medication. Talk to your care team if questions. Talk to your care team if you may be pregnant. Serious birth defects can occur if you take this medication during pregnancy and for 6 months after the last dose. Contraception is recommended while taking this medication and for 6 months after the last dose. Your care team can help you find the option that works  for you. Do not breastfeed while taking this medication and for 6 months after the last dose. This medication can cause infertility. Talk to your care team if you are concerned about your fertility. What side effects may I notice from receiving this medication? Side effects that you should report to your care team as soon as possible: Allergic reactions--skin rash, itching, hives, swelling of the face, lips, tongue, or throat Bleeding--bloody or black, tar-like stools, vomiting blood or brown material that looks like coffee grounds, red or dark brown urine, small red or purple spots on skin, unusual bruising or bleeding Blood clot--pain, swelling, or warmth in the leg, shortness of breath, chest pain Heart attack--pain or tightness in the chest, shoulders, arms, or jaw, nausea, shortness of breath, cold  or clammy skin, feeling faint or lightheaded Heart failure--shortness of breath, swelling of the ankles, feet, or hands, sudden weight gain, unusual weakness or fatigue Increase in blood pressure Infection--fever, chills, cough, sore throat, wounds that don't heal, pain or trouble when passing urine, general feeling of discomfort or being unwell Infusion reactions--chest pain, shortness of breath or trouble breathing, feeling faint or lightheaded Kidney injury--decrease in the amount of urine, swelling of the ankles, hands, or feet Stomach pain that is severe, does not go away, or gets worse Stroke--sudden numbness or weakness of the face, arm, or leg, trouble speaking, confusion, trouble walking, loss of balance or coordination, dizziness, severe headache, change in vision Sudden and severe headache, confusion, change in vision, seizures, which may be signs of posterior reversible encephalopathy syndrome (PRES) Side effects that usually do not require medical attention (report to your care team if they continue or are bothersome): Back pain Change in taste Diarrhea Dry skin Increased tears Nosebleed This list may not describe all possible side effects. Call your doctor for medical advice about side effects. You may report side effects to FDA at 1-800-FDA-1088. Where should I keep my medication? This medication is given in a hospital or clinic. It will not be stored at home. NOTE: This sheet is a summary. It may not cover all possible information. If you have questions about this medicine, talk to your doctor, pharmacist, or health care provider.  2024 Elsevier/Gold Standard (2021-09-09 00:00:00)  Gemcitabine Injection What is this medication? GEMCITABINE (jem SYE ta been) treats some types of cancer. It works by slowing down the growth of cancer cells. This medicine may be used for other purposes; ask your health care provider or pharmacist if you have questions. COMMON BRAND NAME(S):  Gemzar, Infugem What should I tell my care team before I take this medication? They need to know if you have any of these conditions: Blood disorders Infection Kidney disease Liver disease Lung or breathing disease, such as asthma or COPD Recent or ongoing radiation therapy An unusual or allergic reaction to gemcitabine, other medications, foods, dyes, or preservatives If you or your partner are pregnant or trying to get pregnant Breast-feeding How should I use this medication? This medication is injected into a vein. It is given by your care team in a hospital or clinic setting. Talk to your care team about the use of this medication in children. Special care may be needed. Overdosage: If you think you have taken too much of this medicine contact a poison control center or emergency room at once. NOTE: This medicine is only for you. Do not share this medicine with others. What if I miss a dose? Keep appointments for follow-up doses. It is important not to  miss your dose. Call your care team if you are unable to keep an appointment. What may interact with this medication? Interactions have not been studied. This list may not describe all possible interactions. Give your health care provider a list of all the medicines, herbs, non-prescription drugs, or dietary supplements you use. Also tell them if you smoke, drink alcohol, or use illegal drugs. Some items may interact with your medicine. What should I watch for while using this medication? Your condition will be monitored carefully while you are receiving this medication. This medication may make you feel generally unwell. This is not uncommon, as chemotherapy can affect healthy cells as well as cancer cells. Report any side effects. Continue your course of treatment even though you feel ill unless your care team tells you to stop. In some cases, you may be given additional medications to help with side effects. Follow all directions for  their use. This medication may increase your risk of getting an infection. Call your care team for advice if you get a fever, chills, sore throat, or other symptoms of a cold or flu. Do not treat yourself. Try to avoid being around people who are sick. This medication may increase your risk to bruise or bleed. Call your care team if you notice any unusual bleeding. Be careful brushing or flossing your teeth or using a toothpick because you may get an infection or bleed more easily. If you have any dental work done, tell your dentist you are receiving this medication. Avoid taking medications that contain aspirin, acetaminophen, ibuprofen, naproxen, or ketoprofen unless instructed by your care team. These medications may hide a fever. Talk to your care team if you or your partner wish to become pregnant or think you might be pregnant. This medication can cause serious birth defects if taken during pregnancy and for 6 months after the last dose. A negative pregnancy test is required before starting this medication. A reliable form of contraception is recommended while taking this medication and for 6 months after the last dose. Talk to your care team about effective forms of contraception. Do not father a child while taking this medication and for 3 months after the last dose. Use a condom while having sex during this time period. Do not breastfeed while taking this medication and for at least 1 week after the last dose. This medication may cause infertility. Talk to your care team if you are concerned about your fertility. What side effects may I notice from receiving this medication? Side effects that you should report to your care team as soon as possible: Allergic reactions--skin rash, itching, hives, swelling of the face, lips, tongue, or throat Capillary leak syndrome--stomach or muscle pain, unusual weakness or fatigue, feeling faint or lightheaded, decrease in the amount of urine, swelling of the  ankles, hands, or feet, trouble breathing Infection--fever, chills, cough, sore throat, wounds that don't heal, pain or trouble when passing urine, general feeling of discomfort or being unwell Liver injury--right upper belly pain, loss of appetite, nausea, light-colored stool, dark yellow or brown urine, yellowing skin or eyes, unusual weakness or fatigue Low red blood cell level--unusual weakness or fatigue, dizziness, headache, trouble breathing Lung injury--shortness of breath or trouble breathing, cough, spitting up blood, chest pain, fever Stomach pain, bloody diarrhea, pale skin, unusual weakness or fatigue, decrease in the amount of urine, which may be signs of hemolytic uremic syndrome Sudden and severe headache, confusion, change in vision, seizures, which may be signs of posterior  reversible encephalopathy syndrome (PRES) Unusual bruising or bleeding Side effects that usually do not require medical attention (report to your care team if they continue or are bothersome): Diarrhea Drowsiness Hair loss Nausea Pain, redness, or swelling with sores inside the mouth or throat Vomiting This list may not describe all possible side effects. Call your doctor for medical advice about side effects. You may report side effects to FDA at 1-800-FDA-1088. Where should I keep my medication? This medication is given in a hospital or clinic. It will not be stored at home. NOTE: This sheet is a summary. It may not cover all possible information. If you have questions about this medicine, talk to your doctor, pharmacist, or health care provider.  2024 Elsevier/Gold Standard (2021-08-30 00:00:00)

## 2023-07-05 ENCOUNTER — Telehealth: Payer: Self-pay

## 2023-07-05 NOTE — Telephone Encounter (Signed)
 [name] states that she is doing fine. She is eating, drinking, and urinating well. She knows to call the office at [phone] if she has any questions or concerns.

## 2023-07-05 NOTE — Telephone Encounter (Signed)
-----   Message from Nurse Jiles Crocker sent at 07/04/2023  4:51 PM EST ----- Regarding: Dr Bertis Ruddy first TX F/U Dr. Bertis Ruddy first time Gemzar/ 7th dose carbo- tolerated well

## 2023-07-11 ENCOUNTER — Telehealth: Payer: Self-pay

## 2023-07-11 ENCOUNTER — Encounter: Payer: Self-pay | Admitting: Hematology and Oncology

## 2023-07-11 NOTE — Telephone Encounter (Signed)
 Pt called and reported she noticed a rash forming to her left buttocks about 5 days ago. She states it only itches intermittently, and has started to spread down her lateral left thigh. Denies pain/burning or fever. Denies taking any oral or topical antihistamine/analgesics at this time.   Pt is coming to [facility] 3/6 for lab, MD visit and tx. Pt was advised to send MyChart photo of her leg, and use benadryl or hydrocortisone cream in the interim for sx relief until she can see Dr Bertis Ruddy tomorrow to further evaluate. She is agreeable to this.

## 2023-07-12 ENCOUNTER — Inpatient Hospital Stay: Payer: 59 | Attending: Gynecologic Oncology

## 2023-07-12 ENCOUNTER — Encounter: Payer: Self-pay | Admitting: Hematology and Oncology

## 2023-07-12 ENCOUNTER — Inpatient Hospital Stay: Payer: 59 | Admitting: Hematology and Oncology

## 2023-07-12 ENCOUNTER — Inpatient Hospital Stay: Payer: 59

## 2023-07-12 VITALS — BP 140/83 | HR 85 | Temp 97.9°F | Resp 18 | Ht 68.0 in | Wt 184.2 lb

## 2023-07-12 DIAGNOSIS — T451X5A Adverse effect of antineoplastic and immunosuppressive drugs, initial encounter: Secondary | ICD-10-CM | POA: Diagnosis not present

## 2023-07-12 DIAGNOSIS — D701 Agranulocytosis secondary to cancer chemotherapy: Secondary | ICD-10-CM

## 2023-07-12 DIAGNOSIS — R110 Nausea: Secondary | ICD-10-CM

## 2023-07-12 DIAGNOSIS — D6481 Anemia due to antineoplastic chemotherapy: Secondary | ICD-10-CM | POA: Insufficient documentation

## 2023-07-12 DIAGNOSIS — C563 Malignant neoplasm of bilateral ovaries: Secondary | ICD-10-CM | POA: Diagnosis not present

## 2023-07-12 DIAGNOSIS — Z5112 Encounter for antineoplastic immunotherapy: Secondary | ICD-10-CM | POA: Insufficient documentation

## 2023-07-12 DIAGNOSIS — Z5111 Encounter for antineoplastic chemotherapy: Secondary | ICD-10-CM | POA: Diagnosis not present

## 2023-07-12 DIAGNOSIS — R21 Rash and other nonspecific skin eruption: Secondary | ICD-10-CM | POA: Diagnosis not present

## 2023-07-12 DIAGNOSIS — Z95828 Presence of other vascular implants and grafts: Secondary | ICD-10-CM

## 2023-07-12 LAB — CMP (CANCER CENTER ONLY)
ALT: 18 U/L (ref 0–44)
AST: 15 U/L (ref 15–41)
Albumin: 4 g/dL (ref 3.5–5.0)
Alkaline Phosphatase: 86 U/L (ref 38–126)
Anion gap: 6 (ref 5–15)
BUN: 19 mg/dL (ref 8–23)
CO2: 28 mmol/L (ref 22–32)
Calcium: 9 mg/dL (ref 8.9–10.3)
Chloride: 102 mmol/L (ref 98–111)
Creatinine: 0.9 mg/dL (ref 0.44–1.00)
GFR, Estimated: >60 mL/min (ref >60)
Glucose, Bld: 210 mg/dL — ABNORMAL HIGH (ref 70–99)
Potassium: 3.8 mmol/L (ref 3.5–5.1)
Sodium: 136 mmol/L (ref 135–145)
Total Bilirubin: 0.3 mg/dL (ref 0.0–1.2)
Total Protein: 6.9 g/dL (ref 6.5–8.1)

## 2023-07-12 LAB — CBC WITH DIFFERENTIAL (CANCER CENTER ONLY)
Abs Immature Granulocytes: 0 10*3/uL (ref 0.00–0.07)
Basophils Absolute: 0 10*3/uL (ref 0.0–0.1)
Basophils Relative: 1 %
Eosinophils Absolute: 0.1 10*3/uL (ref 0.0–0.5)
Eosinophils Relative: 6 %
HCT: 36.6 % (ref 36.0–46.0)
Hemoglobin: 12.9 g/dL (ref 12.0–15.0)
Immature Granulocytes: 0 %
Lymphocytes Relative: 45 %
Lymphs Abs: 1 10*3/uL (ref 0.7–4.0)
MCH: 33.9 pg (ref 26.0–34.0)
MCHC: 35.2 g/dL (ref 30.0–36.0)
MCV: 96.1 fL (ref 80.0–100.0)
Monocytes Absolute: 0.3 10*3/uL (ref 0.1–1.0)
Monocytes Relative: 11 %
Neutro Abs: 0.9 10*3/uL — ABNORMAL LOW (ref 1.7–7.7)
Neutrophils Relative %: 37 %
Platelet Count: 169 10*3/uL (ref 150–400)
RBC: 3.81 MIL/uL — ABNORMAL LOW (ref 3.87–5.11)
RDW: 10.7 % — ABNORMAL LOW (ref 11.5–15.5)
WBC Count: 2.3 10*3/uL — ABNORMAL LOW (ref 4.0–10.5)
nRBC: 0 % (ref 0.0–0.2)

## 2023-07-12 MED ORDER — SODIUM CHLORIDE 0.9 % IV SOLN
600.0000 mg/m2 | Freq: Once | INTRAVENOUS | Status: AC
  Administered 2023-07-12: 1216 mg via INTRAVENOUS
  Filled 2023-07-12: qty 31.98

## 2023-07-12 MED ORDER — HEPARIN SOD (PORK) LOCK FLUSH 100 UNIT/ML IV SOLN
500.0000 [IU] | Freq: Once | INTRAVENOUS | Status: AC | PRN
Start: 2023-07-12 — End: 2023-07-12
  Administered 2023-07-12: 500 [IU]

## 2023-07-12 MED ORDER — SODIUM CHLORIDE 0.9% FLUSH
10.0000 mL | Freq: Once | INTRAVENOUS | Status: AC
  Administered 2023-07-12: 10 mL

## 2023-07-12 MED ORDER — SODIUM CHLORIDE 0.9 % IV SOLN
INTRAVENOUS | Status: DC
Start: 2023-07-12 — End: 2023-07-12

## 2023-07-12 MED ORDER — SODIUM CHLORIDE 0.9% FLUSH
10.0000 mL | INTRAVENOUS | Status: DC | PRN
Start: 2023-07-12 — End: 2023-07-12
  Administered 2023-07-12: 10 mL

## 2023-07-12 MED ORDER — PROCHLORPERAZINE MALEATE 10 MG PO TABS
10.0000 mg | ORAL_TABLET | Freq: Once | ORAL | Status: AC
  Administered 2023-07-12: 10 mg via ORAL
  Filled 2023-07-12: qty 1

## 2023-07-12 NOTE — Assessment & Plan Note (Addendum)
 I plan dose reduction today Monitor closely We discussed neutropenic precaution

## 2023-07-12 NOTE — Patient Instructions (Signed)

## 2023-07-12 NOTE — Assessment & Plan Note (Addendum)
 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

## 2023-07-12 NOTE — Assessment & Plan Note (Addendum)
 I do not believe this is due to drug reaction It is isolated on her left lower extremity only It is mild, nonspecific Recommend over-the-counter topical steroid cream as needed

## 2023-07-12 NOTE — Assessment & Plan Note (Addendum)
She will continue antiemetics as needed

## 2023-07-12 NOTE — Progress Notes (Signed)
 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, platinum sensitive High grade serous, p53 mutated, Neg genetics, HRD not detected, MSI stable, low TMB 2  She is started on combination chemotherapy with carboplatin, gemcitabine and bevacizumab Her treatment is complicated by worsening blood counts, mild nausea and unspecified rash We will proceed with treatment today with dose adjustment I plan to repeat imaging study after 3 cycles of treatment Leukopenia due to antineoplastic chemotherapy (HCC) I plan dose reduction today Monitor closely We discussed neutropenic precaution Nausea without vomiting She will continue antiemetics as needed Rash and nonspecific skin eruption I do not believe this is due to drug reaction It is isolated on her left lower extremity only It is mild, nonspecific Recommend over-the-counter topical steroid cream as needed  No orders of the defined types were placed in this encounter.    Artis Delay, MD  INTERVAL HISTORY: she returns for treatment follow-up Complications related to previous cycle of chemotherapy included leukopenia,, nausea with/without vomiting,, and rash, She denies recent constipation No recent infection The rash is localized on the left lower extremity  PHYSICAL EXAMINATION: ECOG PERFORMANCE STATUS: 1 - Symptomatic but completely ambulatory  Vitals:   07/12/23 0911  BP: (!) 140/83  Pulse: 85  Resp: 18  Temp: 97.9 F (36.6 C)  SpO2: 100%   Filed Weights   07/12/23 0911  Weight: 184 lb 3.2 oz (83.6 kg)   Examination of her body/skin revealed macular fine rash affecting her left lower extremity, nonspecific  Relevant data reviewed during this visit included CBC and CMP

## 2023-07-13 LAB — CA 125: Cancer Antigen (CA) 125: 33.2 U/mL (ref 0.0–38.1)

## 2023-07-18 ENCOUNTER — Telehealth: Payer: Self-pay | Admitting: *Deleted

## 2023-07-18 NOTE — Telephone Encounter (Signed)
 Pt called and wants to reschedule 3/21 appts due to having an a appt with the "spine doctor", per pt. Scheduling message sent for f/u with rescheduling.

## 2023-07-19 ENCOUNTER — Telehealth: Payer: Self-pay

## 2023-07-19 NOTE — Telephone Encounter (Signed)
 Returned her call. She needs to move 3/21 appts due to having a neurosurgeon appt that day. She can come in on 3/20 for appt if possible.  She has been having pain when she wakes up on the right side near her kidney and left side near her lung. She is exercising and thanks she may just be sore. When she takes her pain medication the pain gets better.

## 2023-07-19 NOTE — Telephone Encounter (Signed)
 Called and given below message. She verbalized understanding.

## 2023-07-19 NOTE — Telephone Encounter (Signed)
 I have already gotten message yesterday related to change in appt, Porsche sent scheduling msg Ok to take pain medications as needed

## 2023-07-23 NOTE — Assessment & Plan Note (Signed)
 She has recurrent ovarian cancer, platinum sensitive High grade serous, p53 mutated, Neg genetics, HRD not detected, MSI stable, low TMB 2  She is tolerated recent combination chemotherapy with carboplatin, gemcitabine and bevacizumab Her previous treatment was complicated by worsening blood counts, mild nausea and unspecified rash, resolved We will proceed with treatment today  I plan to repeat imaging study after 3 cycles of treatment, due end of April

## 2023-07-25 MED FILL — Fosaprepitant Dimeglumine For IV Infusion 150 MG (Base Eq): INTRAVENOUS | Qty: 5 | Status: AC

## 2023-07-26 ENCOUNTER — Inpatient Hospital Stay

## 2023-07-26 ENCOUNTER — Inpatient Hospital Stay: Admitting: Hematology and Oncology

## 2023-07-26 VITALS — BP 120/64 | HR 89 | Temp 97.7°F | Resp 16 | Ht 68.0 in | Wt 185.0 lb

## 2023-07-26 DIAGNOSIS — C563 Malignant neoplasm of bilateral ovaries: Secondary | ICD-10-CM

## 2023-07-26 DIAGNOSIS — D701 Agranulocytosis secondary to cancer chemotherapy: Secondary | ICD-10-CM | POA: Diagnosis not present

## 2023-07-26 DIAGNOSIS — D6481 Anemia due to antineoplastic chemotherapy: Secondary | ICD-10-CM | POA: Diagnosis not present

## 2023-07-26 DIAGNOSIS — Z5112 Encounter for antineoplastic immunotherapy: Secondary | ICD-10-CM | POA: Diagnosis not present

## 2023-07-26 DIAGNOSIS — T451X5A Adverse effect of antineoplastic and immunosuppressive drugs, initial encounter: Secondary | ICD-10-CM

## 2023-07-26 DIAGNOSIS — Z95828 Presence of other vascular implants and grafts: Secondary | ICD-10-CM

## 2023-07-26 DIAGNOSIS — Z5111 Encounter for antineoplastic chemotherapy: Secondary | ICD-10-CM | POA: Diagnosis not present

## 2023-07-26 LAB — CMP (CANCER CENTER ONLY)
ALT: 24 U/L (ref 0–44)
AST: 20 U/L (ref 15–41)
Albumin: 4.2 g/dL (ref 3.5–5.0)
Alkaline Phosphatase: 85 U/L (ref 38–126)
Anion gap: 4 — ABNORMAL LOW (ref 5–15)
BUN: 19 mg/dL (ref 8–23)
CO2: 32 mmol/L (ref 22–32)
Calcium: 9.2 mg/dL (ref 8.9–10.3)
Chloride: 103 mmol/L (ref 98–111)
Creatinine: 1.1 mg/dL — ABNORMAL HIGH (ref 0.44–1.00)
GFR, Estimated: 56 mL/min — ABNORMAL LOW (ref >60)
Glucose, Bld: 122 mg/dL — ABNORMAL HIGH (ref 70–99)
Potassium: 3.9 mmol/L (ref 3.5–5.1)
Sodium: 139 mmol/L (ref 135–145)
Total Bilirubin: 0.3 mg/dL (ref 0.0–1.2)
Total Protein: 6.9 g/dL (ref 6.5–8.1)

## 2023-07-26 LAB — CBC WITH DIFFERENTIAL (CANCER CENTER ONLY)
Abs Immature Granulocytes: 0.02 10*3/uL (ref 0.00–0.07)
Basophils Absolute: 0 10*3/uL (ref 0.0–0.1)
Basophils Relative: 1 %
Eosinophils Absolute: 0.1 10*3/uL (ref 0.0–0.5)
Eosinophils Relative: 2 %
HCT: 33.8 % — ABNORMAL LOW (ref 36.0–46.0)
Hemoglobin: 11.6 g/dL — ABNORMAL LOW (ref 12.0–15.0)
Immature Granulocytes: 1 %
Lymphocytes Relative: 44 %
Lymphs Abs: 1.9 10*3/uL (ref 0.7–4.0)
MCH: 33.9 pg (ref 26.0–34.0)
MCHC: 34.3 g/dL (ref 30.0–36.0)
MCV: 98.8 fL (ref 80.0–100.0)
Monocytes Absolute: 0.7 10*3/uL (ref 0.1–1.0)
Monocytes Relative: 15 %
Neutro Abs: 1.6 10*3/uL — ABNORMAL LOW (ref 1.7–7.7)
Neutrophils Relative %: 37 %
Platelet Count: 356 10*3/uL (ref 150–400)
RBC: 3.42 MIL/uL — ABNORMAL LOW (ref 3.87–5.11)
RDW: 12 % (ref 11.5–15.5)
WBC Count: 4.3 10*3/uL (ref 4.0–10.5)
nRBC: 0 % (ref 0.0–0.2)

## 2023-07-26 LAB — TOTAL PROTEIN, URINE DIPSTICK: Protein, ur: NEGATIVE mg/dL

## 2023-07-26 MED ORDER — PALONOSETRON HCL INJECTION 0.25 MG/5ML
0.2500 mg | Freq: Once | INTRAVENOUS | Status: AC
  Administered 2023-07-26: 0.25 mg via INTRAVENOUS
  Filled 2023-07-26: qty 5

## 2023-07-26 MED ORDER — DEXAMETHASONE SODIUM PHOSPHATE 10 MG/ML IJ SOLN
10.0000 mg | Freq: Once | INTRAMUSCULAR | Status: AC
  Administered 2023-07-26: 10 mg via INTRAVENOUS
  Filled 2023-07-26: qty 1

## 2023-07-26 MED ORDER — SODIUM CHLORIDE 0.9 % IV SOLN: INTRAVENOUS | Status: DC

## 2023-07-26 MED ORDER — SODIUM CHLORIDE 0.9% FLUSH
10.0000 mL | Freq: Once | INTRAVENOUS | Status: AC
  Administered 2023-07-26: 10 mL

## 2023-07-26 MED ORDER — SODIUM CHLORIDE 0.9 % IV SOLN
600.0000 mg/m2 | Freq: Once | INTRAVENOUS | Status: AC
  Administered 2023-07-26: 1216 mg via INTRAVENOUS
  Filled 2023-07-26: qty 31.98

## 2023-07-26 MED ORDER — SODIUM CHLORIDE 0.9 % IV SOLN
150.0000 mg | Freq: Once | INTRAVENOUS | Status: AC
  Administered 2023-07-26: 150 mg via INTRAVENOUS
  Filled 2023-07-26: qty 150

## 2023-07-26 MED ORDER — SODIUM CHLORIDE 0.9 % IV SOLN
15.0000 mg/kg | Freq: Once | INTRAVENOUS | Status: AC
  Administered 2023-07-26: 1300 mg via INTRAVENOUS
  Filled 2023-07-26: qty 4

## 2023-07-26 MED ORDER — CETIRIZINE HCL 10 MG/ML IV SOLN
10.0000 mg | Freq: Once | INTRAVENOUS | Status: AC
  Administered 2023-07-26: 10 mg via INTRAVENOUS
  Filled 2023-07-26: qty 1

## 2023-07-26 MED ORDER — FAMOTIDINE IN NACL 20-0.9 MG/50ML-% IV SOLN
20.0000 mg | Freq: Once | INTRAVENOUS | Status: AC
  Administered 2023-07-26: 20 mg via INTRAVENOUS
  Filled 2023-07-26: qty 50

## 2023-07-26 MED ORDER — SODIUM CHLORIDE 0.9 % IV SOLN
375.2000 mg | Freq: Once | INTRAVENOUS | Status: AC
  Administered 2023-07-26: 380 mg via INTRAVENOUS
  Filled 2023-07-26: qty 38

## 2023-07-26 NOTE — Progress Notes (Signed)
 [facility] OFFICE PROGRESS NOTE  Patient Care Team: Noberto Retort, MD as PCP - General (Family Medicine) Noel Christmas (Physician Assistant)  Assessment & Plan Bilateral primary ovarian cancer [facility]) She has recurrent ovarian cancer, platinum sensitive High grade serous, p53 mutated, Neg genetics, HRD not detected, MSI stable, low TMB 2  She is tolerated recent combination chemotherapy with carboplatin, gemcitabine and bevacizumab Her previous treatment was complicated by worsening blood counts, mild nausea and unspecified rash, resolved We will proceed with treatment today  I plan to repeat imaging study after 3 cycles of treatment, due end of April Anemia due to antineoplastic chemotherapy This is likely due to recent treatment. The patient denies recent history of bleeding such as epistaxis, hematuria or hematochezia. She is asymptomatic from the anemia. I will observe for now.  She does not require transfusion now. I will continue the chemotherapy at current dose without dosage adjustment.  If the anemia gets progressive worse in the future, I might have to delay her treatment or adjust the chemotherapy dose.   No orders of the defined types were placed in this encounter.    Artis Delay, MD  INTERVAL HISTORY: she returns for treatment follow-up Complications related to previous cycle of chemotherapy included anemia,  PHYSICAL EXAMINATION: ECOG PERFORMANCE STATUS: 1 - Symptomatic but completely ambulatory  Vitals:   07/26/23 1325  BP: 120/64  Pulse: 89  Resp: 16  Temp: 97.7 F (36.5 C)  SpO2: 99%   Filed Weights   07/26/23 1325  Weight: 185 lb (83.9 kg)    Relevant data reviewed during this visit included CBC and CMP

## 2023-07-26 NOTE — Assessment & Plan Note (Addendum)
 This is likely due to recent treatment. The patient denies recent history of bleeding such as epistaxis, hematuria or hematochezia. She is asymptomatic from the anemia. I will observe for now.  She does not require transfusion now. I will continue the chemotherapy at current dose without dosage adjustment.  If the anemia gets progressive worse in the future, I might have to delay her treatment or adjust the chemotherapy dose.

## 2023-07-26 NOTE — Patient Instructions (Signed)
 [facility] - A DEPT OF [facility]  Discharge Instructions: Thank you for choosing [facility] to provide your oncology and hematology care.   If you have a lab appointment with the Cancer Center, please go directly to the Cancer Center and check in at the registration area.   Wear comfortable clothing and clothing appropriate for easy access to any Portacath or PICC line.   We strive to give you quality time with your provider. You may need to reschedule your appointment if you arrive late (15 or more minutes).  Arriving late affects you and other patients whose appointments are after yours.  Also, if you miss three or more appointments without notifying the office, you may be dismissed from the clinic at the provider's discretion.      For prescription refill requests, have your pharmacy contact our office and allow 72 hours for refills to be completed.    Today you received the following chemotherapy and/or immunotherapy agents bevicizumab-awwb, gemcitabine, carboplatin       To help prevent nausea and vomiting after your treatment, we encourage you to take your nausea medication as directed.  BELOW ARE SYMPTOMS THAT SHOULD BE REPORTED IMMEDIATELY: *FEVER GREATER THAN 100.4 F (38 C) OR HIGHER *CHILLS OR SWEATING *NAUSEA AND VOMITING THAT IS NOT CONTROLLED WITH YOUR NAUSEA MEDICATION *UNUSUAL SHORTNESS OF BREATH *UNUSUAL BRUISING OR BLEEDING *URINARY PROBLEMS (pain or burning when urinating, or frequent urination) *BOWEL PROBLEMS (unusual diarrhea, constipation, pain near the anus) TENDERNESS IN MOUTH AND THROAT WITH OR WITHOUT PRESENCE OF ULCERS (sore throat, sores in mouth, or a toothache) UNUSUAL RASH, SWELLING OR PAIN  UNUSUAL VAGINAL DISCHARGE OR ITCHING   Items with * indicate a potential emergency and should be followed up as soon as possible or go to the Emergency Department if any problems should occur.  Please show the  CHEMOTHERAPY ALERT CARD or IMMUNOTHERAPY ALERT CARD at check-in to the Emergency Department and triage nurse.  Should you have questions after your visit or need to cancel or reschedule your appointment, please contact [facility] - A DEPT OF [facility]  Dept: [phone]  and follow the prompts.  Office hours are 8:00 a.m. to 4:30 p.m. Monday - Friday. Please note that voicemails left after 4:00 p.m. may not be returned until the following business day.  We are closed weekends and major holidays. You have access to a nurse at all times for urgent questions. Please call the main number to the clinic Dept: [phone] and follow the prompts.   For any non-urgent questions, you may also contact your provider using MyChart. We now offer e-Visits for anyone 71 and older to request care online for non-urgent symptoms. For details visit [email or web address].   Also download the MyChart app! Go to the app store, search "MyChart", open the app, select [facility], and log in with your MyChart username and password.

## 2023-07-27 ENCOUNTER — Other Ambulatory Visit: Payer: 59

## 2023-07-27 ENCOUNTER — Ambulatory Visit: Payer: 59 | Admitting: Hematology and Oncology

## 2023-07-27 ENCOUNTER — Ambulatory Visit: Payer: 59

## 2023-07-27 DIAGNOSIS — M5412 Radiculopathy, cervical region: Secondary | ICD-10-CM | POA: Diagnosis not present

## 2023-07-27 DIAGNOSIS — M47812 Spondylosis without myelopathy or radiculopathy, cervical region: Secondary | ICD-10-CM | POA: Diagnosis not present

## 2023-08-03 ENCOUNTER — Inpatient Hospital Stay: Payer: 59 | Admitting: Hematology and Oncology

## 2023-08-03 ENCOUNTER — Other Ambulatory Visit: Payer: Self-pay

## 2023-08-03 ENCOUNTER — Inpatient Hospital Stay: Payer: 59

## 2023-08-03 ENCOUNTER — Other Ambulatory Visit (HOSPITAL_COMMUNITY): Payer: Self-pay

## 2023-08-03 VITALS — BP 147/97 | HR 83 | Temp 98.5°F | Resp 18 | Ht 68.0 in | Wt 181.4 lb

## 2023-08-03 DIAGNOSIS — D61818 Other pancytopenia: Secondary | ICD-10-CM

## 2023-08-03 DIAGNOSIS — D6481 Anemia due to antineoplastic chemotherapy: Secondary | ICD-10-CM | POA: Diagnosis not present

## 2023-08-03 DIAGNOSIS — K1231 Oral mucositis (ulcerative) due to antineoplastic therapy: Secondary | ICD-10-CM | POA: Diagnosis not present

## 2023-08-03 DIAGNOSIS — C563 Malignant neoplasm of bilateral ovaries: Secondary | ICD-10-CM

## 2023-08-03 DIAGNOSIS — Z5112 Encounter for antineoplastic immunotherapy: Secondary | ICD-10-CM | POA: Diagnosis not present

## 2023-08-03 DIAGNOSIS — D701 Agranulocytosis secondary to cancer chemotherapy: Secondary | ICD-10-CM | POA: Diagnosis not present

## 2023-08-03 DIAGNOSIS — Z95828 Presence of other vascular implants and grafts: Secondary | ICD-10-CM

## 2023-08-03 DIAGNOSIS — Z5111 Encounter for antineoplastic chemotherapy: Secondary | ICD-10-CM | POA: Diagnosis not present

## 2023-08-03 LAB — CMP (CANCER CENTER ONLY)
ALT: 41 U/L (ref 0–44)
AST: 31 U/L (ref 15–41)
Albumin: 4.1 g/dL (ref 3.5–5.0)
Alkaline Phosphatase: 89 U/L (ref 38–126)
Anion gap: 6 (ref 5–15)
BUN: 15 mg/dL (ref 8–23)
CO2: 27 mmol/L (ref 22–32)
Calcium: 9.2 mg/dL (ref 8.9–10.3)
Chloride: 108 mmol/L (ref 98–111)
Creatinine: 0.93 mg/dL (ref 0.44–1.00)
GFR, Estimated: >60 mL/min (ref >60)
Glucose, Bld: 164 mg/dL — ABNORMAL HIGH (ref 70–99)
Potassium: 3.8 mmol/L (ref 3.5–5.1)
Sodium: 141 mmol/L (ref 135–145)
Total Bilirubin: 0.3 mg/dL (ref 0.0–1.2)
Total Protein: 6.9 g/dL (ref 6.5–8.1)

## 2023-08-03 LAB — CBC WITH DIFFERENTIAL (CANCER CENTER ONLY)
Abs Immature Granulocytes: 0.03 10*3/uL (ref 0.00–0.07)
Basophils Absolute: 0 10*3/uL (ref 0.0–0.1)
Basophils Relative: 1 %
Eosinophils Absolute: 0 10*3/uL (ref 0.0–0.5)
Eosinophils Relative: 1 %
HCT: 34.2 % — ABNORMAL LOW (ref 36.0–46.0)
Hemoglobin: 11.9 g/dL — ABNORMAL LOW (ref 12.0–15.0)
Immature Granulocytes: 1 %
Lymphocytes Relative: 45 %
Lymphs Abs: 1.3 10*3/uL (ref 0.7–4.0)
MCH: 34.2 pg — ABNORMAL HIGH (ref 26.0–34.0)
MCHC: 34.8 g/dL (ref 30.0–36.0)
MCV: 98.3 fL (ref 80.0–100.0)
Monocytes Absolute: 0.2 10*3/uL (ref 0.1–1.0)
Monocytes Relative: 8 %
Neutro Abs: 1.3 10*3/uL — ABNORMAL LOW (ref 1.7–7.7)
Neutrophils Relative %: 44 %
Platelet Count: 327 10*3/uL (ref 150–400)
RBC: 3.48 MIL/uL — ABNORMAL LOW (ref 3.87–5.11)
RDW: 11.9 % (ref 11.5–15.5)
WBC Count: 2.9 10*3/uL — ABNORMAL LOW (ref 4.0–10.5)
nRBC: 0 % (ref 0.0–0.2)

## 2023-08-03 MED ORDER — SODIUM CHLORIDE 0.9% FLUSH
10.0000 mL | INTRAVENOUS | Status: DC | PRN
Start: 2023-08-03 — End: 2023-08-03
  Administered 2023-08-03: 10 mL

## 2023-08-03 MED ORDER — SODIUM CHLORIDE 0.9 % IV SOLN
INTRAVENOUS | Status: DC
Start: 2023-08-03 — End: 2023-08-03

## 2023-08-03 MED ORDER — NYSTATIN 100000 UNIT/ML MT SUSP
5.0000 mL | Freq: Four times a day (QID) | OROMUCOSAL | 0 refills | Status: DC
  Filled 2023-08-03: qty 240, 12d supply, fill #0

## 2023-08-03 MED ORDER — SODIUM CHLORIDE 0.9 % IV SOLN
600.0000 mg/m2 | Freq: Once | INTRAVENOUS | Status: AC
  Administered 2023-08-03: 1216 mg via INTRAVENOUS
  Filled 2023-08-03: qty 31.98

## 2023-08-03 MED ORDER — SODIUM CHLORIDE 0.9% FLUSH
10.0000 mL | Freq: Once | INTRAVENOUS | Status: AC
  Administered 2023-08-03: 10 mL

## 2023-08-03 MED ORDER — HEPARIN SOD (PORK) LOCK FLUSH 100 UNIT/ML IV SOLN
500.0000 [IU] | Freq: Once | INTRAVENOUS | Status: AC | PRN
  Administered 2023-08-03: 500 [IU]

## 2023-08-03 MED ORDER — PROCHLORPERAZINE MALEATE 10 MG PO TABS
10.0000 mg | ORAL_TABLET | Freq: Once | ORAL | Status: AC
  Administered 2023-08-03: 10 mg via ORAL
  Filled 2023-08-03: qty 1

## 2023-08-03 NOTE — Progress Notes (Signed)
 [facility] OFFICE PROGRESS NOTE  Patient Care Team: Noberto Retort, MD as PCP - General (Family Medicine) Noel Christmas (Physician Assistant)  Assessment & Plan Bilateral primary ovarian cancer [facility]) She has recurrent ovarian cancer, platinum sensitive High grade serous, p53 mutated, Neg genetics, HRD not detected, MSI stable, low TMB 2  She is tolerated recent combination chemotherapy with carboplatin, gemcitabine and bevacizumab Her previous treatment was complicated by worsening blood counts, mild nausea and mucostis We will proceed with treatment today  I plan to repeat imaging study after 3 cycles of treatment, due end of April Pancytopenia, acquired [facility]) Due to recent treatment She is not symptomatic Monitor closely Mucositis due to chemotherapy Due to treatment but not debilitating We will prescribe Magic mouthwash with lidocaine swish and spit  Orders Placed This Encounter  Procedures   CT ABDOMEN PELVIS W CONTRAST    Standing Status:   Future    Expected Date:   08/30/2023    Expiration Date:   08/02/2024    Scheduling Instructions:     No need oral contrast    If indicated for the ordered procedure, I authorize the administration of contrast media per Radiology protocol:   Yes    Does the patient have a contrast media/X-ray dye allergy?:   No    Preferred imaging location?:   [facility]    If indicated for the ordered procedure, I authorize the administration of oral contrast media per Radiology protocol:   No    Reason for no oral contrast::   No need oral contrast     Artis Delay, MD  INTERVAL HISTORY: she returns for treatment follow-up Complications related to previous cycle of chemotherapy included pancytopenia, and mucositis,  PHYSICAL EXAMINATION: ECOG PERFORMANCE STATUS: 0 - Asymptomatic  Vitals:   08/03/23 0951  BP: (!) 147/97  Pulse: 83  Resp: 18  Temp: 98.5 F (36.9 C)  SpO2: 100%   Filed Weights    08/03/23 0951  Weight: 181 lb 6.4 oz (82.3 kg)    Relevant data reviewed during this visit included CBC and CMP

## 2023-08-03 NOTE — Patient Instructions (Signed)

## 2023-08-03 NOTE — Assessment & Plan Note (Addendum)
 She has recurrent ovarian cancer, platinum sensitive High grade serous, p53 mutated, Neg genetics, HRD not detected, MSI stable, low TMB 2  She is tolerated recent combination chemotherapy with carboplatin, gemcitabine and bevacizumab Her previous treatment was complicated by worsening blood counts, mild nausea and mucostis We will proceed with treatment today  I plan to repeat imaging study after 3 cycles of treatment, due end of April

## 2023-08-03 NOTE — Assessment & Plan Note (Addendum)
 Due to recent treatment She is not symptomatic Monitor closely

## 2023-08-03 NOTE — Assessment & Plan Note (Addendum)
 Due to treatment but not debilitating We will prescribe Magic mouthwash with lidocaine swish and spit

## 2023-08-06 ENCOUNTER — Other Ambulatory Visit (HOSPITAL_COMMUNITY): Payer: Self-pay

## 2023-08-10 DIAGNOSIS — M792 Neuralgia and neuritis, unspecified: Secondary | ICD-10-CM | POA: Diagnosis not present

## 2023-08-10 DIAGNOSIS — G35 Multiple sclerosis: Secondary | ICD-10-CM | POA: Diagnosis not present

## 2023-08-10 DIAGNOSIS — G4700 Insomnia, unspecified: Secondary | ICD-10-CM | POA: Diagnosis not present

## 2023-08-16 MED FILL — Fosaprepitant Dimeglumine For IV Infusion 150 MG (Base Eq): INTRAVENOUS | Qty: 5 | Status: AC

## 2023-08-17 ENCOUNTER — Inpatient Hospital Stay: Payer: 59 | Attending: Gynecologic Oncology

## 2023-08-17 ENCOUNTER — Inpatient Hospital Stay (HOSPITAL_BASED_OUTPATIENT_CLINIC_OR_DEPARTMENT_OTHER): Payer: 59 | Admitting: Hematology and Oncology

## 2023-08-17 ENCOUNTER — Encounter: Payer: Self-pay | Admitting: Hematology and Oncology

## 2023-08-17 ENCOUNTER — Inpatient Hospital Stay: Payer: 59

## 2023-08-17 VITALS — BP 155/82 | HR 82 | Temp 98.0°F | Resp 18 | Ht 68.0 in | Wt 181.4 lb

## 2023-08-17 DIAGNOSIS — C563 Malignant neoplasm of bilateral ovaries: Secondary | ICD-10-CM

## 2023-08-17 DIAGNOSIS — D61818 Other pancytopenia: Secondary | ICD-10-CM

## 2023-08-17 DIAGNOSIS — Z5112 Encounter for antineoplastic immunotherapy: Secondary | ICD-10-CM | POA: Insufficient documentation

## 2023-08-17 DIAGNOSIS — I10 Essential (primary) hypertension: Secondary | ICD-10-CM

## 2023-08-17 DIAGNOSIS — Z5111 Encounter for antineoplastic chemotherapy: Secondary | ICD-10-CM | POA: Insufficient documentation

## 2023-08-17 DIAGNOSIS — Z95828 Presence of other vascular implants and grafts: Secondary | ICD-10-CM

## 2023-08-17 LAB — CBC WITH DIFFERENTIAL (CANCER CENTER ONLY)
Abs Immature Granulocytes: 0.02 10*3/uL (ref 0.00–0.07)
Basophils Absolute: 0 10*3/uL (ref 0.0–0.1)
Basophils Relative: 1 %
Eosinophils Absolute: 0.1 10*3/uL (ref 0.0–0.5)
Eosinophils Relative: 2 %
HCT: 34.8 % — ABNORMAL LOW (ref 36.0–46.0)
Hemoglobin: 12.4 g/dL (ref 12.0–15.0)
Immature Granulocytes: 1 %
Lymphocytes Relative: 38 %
Lymphs Abs: 1.3 10*3/uL (ref 0.7–4.0)
MCH: 34.5 pg — ABNORMAL HIGH (ref 26.0–34.0)
MCHC: 35.6 g/dL (ref 30.0–36.0)
MCV: 96.9 fL (ref 80.0–100.0)
Monocytes Absolute: 0.8 10*3/uL (ref 0.1–1.0)
Monocytes Relative: 24 %
Neutro Abs: 1.1 10*3/uL — ABNORMAL LOW (ref 1.7–7.7)
Neutrophils Relative %: 34 %
Platelet Count: 269 10*3/uL (ref 150–400)
RBC: 3.59 MIL/uL — ABNORMAL LOW (ref 3.87–5.11)
RDW: 14 % (ref 11.5–15.5)
WBC Count: 3.3 10*3/uL — ABNORMAL LOW (ref 4.0–10.5)
nRBC: 0 % (ref 0.0–0.2)

## 2023-08-17 LAB — CMP (CANCER CENTER ONLY)
ALT: 20 U/L (ref 0–44)
AST: 18 U/L (ref 15–41)
Albumin: 4.3 g/dL (ref 3.5–5.0)
Alkaline Phosphatase: 84 U/L (ref 38–126)
Anion gap: 5 (ref 5–15)
BUN: 17 mg/dL (ref 8–23)
CO2: 28 mmol/L (ref 22–32)
Calcium: 9.3 mg/dL (ref 8.9–10.3)
Chloride: 104 mmol/L (ref 98–111)
Creatinine: 0.87 mg/dL (ref 0.44–1.00)
GFR, Estimated: >60 mL/min (ref >60)
Glucose, Bld: 121 mg/dL — ABNORMAL HIGH (ref 70–99)
Potassium: 3.9 mmol/L (ref 3.5–5.1)
Sodium: 137 mmol/L (ref 135–145)
Total Bilirubin: 0.3 mg/dL (ref 0.0–1.2)
Total Protein: 7.3 g/dL (ref 6.5–8.1)

## 2023-08-17 LAB — TOTAL PROTEIN, URINE DIPSTICK: Protein, ur: NEGATIVE mg/dL

## 2023-08-17 MED ORDER — SODIUM CHLORIDE 0.9 % IV SOLN: INTRAVENOUS | Status: DC

## 2023-08-17 MED ORDER — SODIUM CHLORIDE 0.9 % IV SOLN
600.0000 mg/m2 | Freq: Once | INTRAVENOUS | Status: AC
  Administered 2023-08-17: 1216 mg via INTRAVENOUS
  Filled 2023-08-17: qty 31.98

## 2023-08-17 MED ORDER — SODIUM CHLORIDE 0.9 % IV SOLN
10.0000 mg/kg | Freq: Once | INTRAVENOUS | Status: AC
  Administered 2023-08-17: 800 mg via INTRAVENOUS
  Filled 2023-08-17: qty 32

## 2023-08-17 MED ORDER — SODIUM CHLORIDE 0.9 % IV SOLN
397.2000 mg | Freq: Once | INTRAVENOUS | Status: AC
  Administered 2023-08-17: 400 mg via INTRAVENOUS
  Filled 2023-08-17: qty 40

## 2023-08-17 MED ORDER — DEXAMETHASONE SODIUM PHOSPHATE 10 MG/ML IJ SOLN
10.0000 mg | Freq: Once | INTRAMUSCULAR | Status: AC
  Administered 2023-08-17: 10 mg via INTRAVENOUS
  Filled 2023-08-17: qty 1

## 2023-08-17 MED ORDER — SODIUM CHLORIDE 0.9 % IV SOLN
150.0000 mg | Freq: Once | INTRAVENOUS | Status: AC
  Administered 2023-08-17: 150 mg via INTRAVENOUS
  Filled 2023-08-17: qty 150

## 2023-08-17 MED ORDER — ATENOLOL 25 MG PO TABS: 25.0000 mg | ORAL_TABLET | Freq: Every day | ORAL | 1 refills | Status: DC

## 2023-08-17 MED ORDER — PALONOSETRON HCL INJECTION 0.25 MG/5ML
0.2500 mg | Freq: Once | INTRAVENOUS | Status: AC
Start: 2023-08-17 — End: 2023-08-17
  Administered 2023-08-17: 0.25 mg via INTRAVENOUS
  Filled 2023-08-17: qty 5

## 2023-08-17 MED ORDER — CETIRIZINE HCL 10 MG/ML IV SOLN
10.0000 mg | Freq: Once | INTRAVENOUS | Status: AC
  Administered 2023-08-17: 10 mg via INTRAVENOUS
  Filled 2023-08-17: qty 1

## 2023-08-17 MED ORDER — FAMOTIDINE IN NACL 20-0.9 MG/50ML-% IV SOLN
20.0000 mg | Freq: Once | INTRAVENOUS | Status: AC
  Administered 2023-08-17: 20 mg via INTRAVENOUS
  Filled 2023-08-17: qty 50

## 2023-08-17 MED ORDER — SODIUM CHLORIDE 0.9% FLUSH
10.0000 mL | Freq: Once | INTRAVENOUS | Status: AC
  Administered 2023-08-17: 10 mL

## 2023-08-17 NOTE — Progress Notes (Signed)
 [name] OFFICE PROGRESS NOTE  Patient Care Team: Noberto Retort, MD as PCP - General (Family Medicine) Noel Christmas (Physician Assistant)  Assessment & Plan Bilateral primary ovarian cancer [facility]) She has recurrent ovarian cancer, platinum sensitive High grade serous, p53 mutated, Neg genetics, HRD not detected, MSI stable, low TMB 2  She is tolerated recent combination chemotherapy with carboplatin, gemcitabine and bevacizumab Her previous treatment was complicated by worsening blood counts, and hypertension We will proceed with treatment today  I plan to repeat imaging study after 3 cycles of treatment, due end of April I recommend spacing out her treatment so that she get extra break in between day 1 and day 8 Her next treatment will be 2 weeks from now I also recommend additional medication to treat her hypertension Pancytopenia, acquired (HCC) Due to recent treatment She is not symptomatic Monitor closely We will proceed with treatment without delay but for her next cycle, I plan to give her additional week of break to allow bone marrow recovery Essential hypertension Blood pressure is elevated likely due to bevacizumab I recommend addition of atenolol to be taken at nighttime to get her blood pressure better controlled while she is on bevacizumab Urine protein is negative  No orders of the defined types were placed in this encounter.    Artis Delay, MD  INTERVAL HISTORY: she returns for treatment follow-up Complications related to previous cycle of chemotherapy included pancytopenia,, cancer associated pain,, and elevated BP With documented blood pressure at home has been running in the 140s and 150s She has chronic pain syndrome prior to diagnosis and it seems to be exacerbated while she is on this current treatment No recent infection Denies recent bleeding  PHYSICAL EXAMINATION: ECOG PERFORMANCE STATUS: 1 - Symptomatic but completely  ambulatory  Vitals:   08/17/23 0950  BP: (!) 155/82  Pulse: 82  Resp: 18  Temp: 98 F (36.7 C)  SpO2: 100%   Filed Weights   08/17/23 0950  Weight: 181 lb 6.4 oz (82.3 kg)    Relevant data reviewed during this visit included CBC, CMP and urine protein

## 2023-08-17 NOTE — Assessment & Plan Note (Addendum)
 She has recurrent ovarian cancer, platinum sensitive High grade serous, p53 mutated, Neg genetics, HRD not detected, MSI stable, low TMB 2  She is tolerated recent combination chemotherapy with carboplatin, gemcitabine and bevacizumab Her previous treatment was complicated by worsening blood counts, and hypertension We will proceed with treatment today  I plan to repeat imaging study after 3 cycles of treatment, due end of April I recommend spacing out her treatment so that she get extra break in between day 1 and day 8 Her next treatment will be 2 weeks from now I also recommend additional medication to treat her hypertension

## 2023-08-17 NOTE — Assessment & Plan Note (Addendum)
 Blood pressure is elevated likely due to bevacizumab I recommend addition of atenolol to be taken at nighttime to get her blood pressure better controlled while she is on bevacizumab Urine protein is negative

## 2023-08-17 NOTE — Assessment & Plan Note (Addendum)
 Due to recent treatment She is not symptomatic Monitor closely We will proceed with treatment without delay but for her next cycle, I plan to give her additional week of break to allow bone marrow recovery

## 2023-08-18 LAB — CA 125: Cancer Antigen (CA) 125: 16.1 U/mL (ref 0.0–38.1)

## 2023-08-27 DIAGNOSIS — H25813 Combined forms of age-related cataract, bilateral: Secondary | ICD-10-CM | POA: Diagnosis not present

## 2023-08-27 DIAGNOSIS — H35033 Hypertensive retinopathy, bilateral: Secondary | ICD-10-CM | POA: Diagnosis not present

## 2023-08-27 DIAGNOSIS — G35 Multiple sclerosis: Secondary | ICD-10-CM | POA: Diagnosis not present

## 2023-08-27 DIAGNOSIS — R7309 Other abnormal glucose: Secondary | ICD-10-CM | POA: Diagnosis not present

## 2023-08-27 DIAGNOSIS — H04123 Dry eye syndrome of bilateral lacrimal glands: Secondary | ICD-10-CM | POA: Diagnosis not present

## 2023-08-30 ENCOUNTER — Ambulatory Visit (HOSPITAL_COMMUNITY)
Admission: RE | Admit: 2023-08-30 | Discharge: 2023-08-30 | Disposition: A | Discharge status: Home / Self Care | Source: Ambulatory Visit | Attending: Hematology and Oncology | Admitting: Hematology and Oncology

## 2023-08-30 ENCOUNTER — Inpatient Hospital Stay

## 2023-08-30 DIAGNOSIS — N132 Hydronephrosis with renal and ureteral calculous obstruction: Secondary | ICD-10-CM | POA: Diagnosis not present

## 2023-08-30 DIAGNOSIS — C563 Malignant neoplasm of bilateral ovaries: Secondary | ICD-10-CM | POA: Insufficient documentation

## 2023-08-30 DIAGNOSIS — Z95828 Presence of other vascular implants and grafts: Secondary | ICD-10-CM

## 2023-08-30 DIAGNOSIS — N134 Hydroureter: Secondary | ICD-10-CM | POA: Diagnosis not present

## 2023-08-30 LAB — COMPREHENSIVE METABOLIC PANEL WITH GFR
ALT: 22 U/L (ref 0–44)
AST: 20 U/L (ref 15–41)
Albumin: 4.2 g/dL (ref 3.5–5.0)
Alkaline Phosphatase: 84 U/L (ref 38–126)
Anion gap: 3 — ABNORMAL LOW (ref 5–15)
BUN: 10 mg/dL (ref 8–23)
CO2: 31 mmol/L (ref 22–32)
Calcium: 9.2 mg/dL (ref 8.9–10.3)
Chloride: 105 mmol/L (ref 98–111)
Creatinine, Ser: 0.82 mg/dL (ref 0.44–1.00)
GFR, Estimated: >60 mL/min (ref >60)
Glucose, Bld: 119 mg/dL — ABNORMAL HIGH (ref 70–99)
Potassium: 4.2 mmol/L (ref 3.5–5.1)
Sodium: 139 mmol/L (ref 135–145)
Total Bilirubin: 0.3 mg/dL (ref 0.0–1.2)
Total Protein: 7 g/dL (ref 6.5–8.1)

## 2023-08-30 LAB — CBC WITH DIFFERENTIAL/PLATELET
Abs Immature Granulocytes: 0 10*3/uL (ref 0.00–0.07)
Basophils Absolute: 0 10*3/uL (ref 0.0–0.1)
Basophils Relative: 1 %
Eosinophils Absolute: 0.1 10*3/uL (ref 0.0–0.5)
Eosinophils Relative: 2 %
HCT: 34.1 % — ABNORMAL LOW (ref 36.0–46.0)
Hemoglobin: 11.9 g/dL — ABNORMAL LOW (ref 12.0–15.0)
Immature Granulocytes: 0 %
Lymphocytes Relative: 37 %
Lymphs Abs: 1.2 10*3/uL (ref 0.7–4.0)
MCH: 34.8 pg — ABNORMAL HIGH (ref 26.0–34.0)
MCHC: 34.9 g/dL (ref 30.0–36.0)
MCV: 99.7 fL (ref 80.0–100.0)
Monocytes Absolute: 0.6 10*3/uL (ref 0.1–1.0)
Monocytes Relative: 19 %
Neutro Abs: 1.4 10*3/uL — ABNORMAL LOW (ref 1.7–7.7)
Neutrophils Relative %: 41 %
Platelets: 169 10*3/uL (ref 150–400)
RBC: 3.42 MIL/uL — ABNORMAL LOW (ref 3.87–5.11)
RDW: 15 % (ref 11.5–15.5)
WBC: 3.3 10*3/uL — ABNORMAL LOW (ref 4.0–10.5)
nRBC: 0 % (ref 0.0–0.2)

## 2023-08-30 MED ORDER — IOHEXOL 9 MG/ML PO SOLN
ORAL | Status: AC
  Filled 2023-08-30: qty 1000

## 2023-08-30 MED ORDER — IOHEXOL 9 MG/ML PO SOLN
1000.0000 mL | Freq: Once | ORAL | Status: AC
  Administered 2023-08-30: 1000 mL via ORAL

## 2023-08-30 MED ORDER — IOHEXOL 300 MG/ML  SOLN
100.0000 mL | Freq: Once | INTRAMUSCULAR | Status: AC | PRN
  Administered 2023-08-30: 100 mL via INTRAVENOUS

## 2023-08-30 MED ORDER — SODIUM CHLORIDE 0.9% FLUSH
10.0000 mL | Freq: Once | INTRAVENOUS | Status: AC
  Administered 2023-08-30: 10 mL

## 2023-08-30 MED ORDER — HEPARIN SOD (PORK) LOCK FLUSH 100 UNIT/ML IV SOLN
500.0000 [IU] | Freq: Once | INTRAVENOUS | Status: AC
  Administered 2023-08-30: 500 [IU] via INTRAVENOUS

## 2023-08-30 NOTE — Assessment & Plan Note (Addendum)
 She has recurrent ovarian cancer, platinum sensitive High grade serous, p53 mutated, Neg genetics, HRD not detected, MSI stable, low TMB 2  She is tolerated recent combination chemotherapy with carboplatin , gemcitabine  and bevacizumab  Her previous treatment was complicated by worsening blood counts, mucositis and hypertension  I reviewed CT imaging from 08/30/2023 with the patient Overall, she has stable disease control, will proceed with treatment today Even though some of the areas appear larger, overall, does not meet the criteria for progression Plan to continue treatment for 3 more cycles with plan to repeat imaging study end of June Due to mucositis and low blood count, I plan further dose reduction of gemcitabine 

## 2023-08-31 ENCOUNTER — Inpatient Hospital Stay (HOSPITAL_BASED_OUTPATIENT_CLINIC_OR_DEPARTMENT_OTHER): Admitting: Hematology and Oncology

## 2023-08-31 ENCOUNTER — Inpatient Hospital Stay

## 2023-08-31 ENCOUNTER — Other Ambulatory Visit (HOSPITAL_COMMUNITY): Payer: Self-pay

## 2023-08-31 ENCOUNTER — Other Ambulatory Visit: Payer: Self-pay

## 2023-08-31 ENCOUNTER — Encounter: Payer: Self-pay | Admitting: Hematology and Oncology

## 2023-08-31 VITALS — Resp 20

## 2023-08-31 VITALS — BP 168/96 | HR 68 | Temp 97.5°F | Resp 18 | Ht 68.0 in | Wt 183.2 lb

## 2023-08-31 DIAGNOSIS — D61818 Other pancytopenia: Secondary | ICD-10-CM

## 2023-08-31 DIAGNOSIS — K1231 Oral mucositis (ulcerative) due to antineoplastic therapy: Secondary | ICD-10-CM

## 2023-08-31 DIAGNOSIS — C563 Malignant neoplasm of bilateral ovaries: Secondary | ICD-10-CM | POA: Diagnosis not present

## 2023-08-31 DIAGNOSIS — Z5111 Encounter for antineoplastic chemotherapy: Secondary | ICD-10-CM | POA: Diagnosis not present

## 2023-08-31 DIAGNOSIS — Z5112 Encounter for antineoplastic immunotherapy: Secondary | ICD-10-CM | POA: Diagnosis not present

## 2023-08-31 MED ORDER — PROCHLORPERAZINE MALEATE 10 MG PO TABS
10.0000 mg | ORAL_TABLET | Freq: Once | ORAL | Status: AC
  Administered 2023-08-31: 10 mg via ORAL
  Filled 2023-08-31: qty 1

## 2023-08-31 MED ORDER — SODIUM CHLORIDE 0.9 % IV SOLN
504.0000 mg/m2 | Freq: Once | INTRAVENOUS | Status: AC
  Administered 2023-08-31: 1026 mg via INTRAVENOUS
  Filled 2023-08-31: qty 26.98

## 2023-08-31 MED ORDER — SODIUM CHLORIDE 0.9% FLUSH
10.0000 mL | INTRAVENOUS | Status: DC | PRN
  Administered 2023-08-31: 10 mL

## 2023-08-31 MED ORDER — NYSTATIN 100000 UNIT/ML MT SUSP
5.0000 mL | Freq: Four times a day (QID) | OROMUCOSAL | 0 refills | Status: DC
  Filled 2023-08-31: qty 240, 12d supply, fill #0

## 2023-08-31 MED ORDER — HEPARIN SOD (PORK) LOCK FLUSH 100 UNIT/ML IV SOLN
500.0000 [IU] | Freq: Once | INTRAVENOUS | Status: AC | PRN
  Administered 2023-08-31: 500 [IU]

## 2023-08-31 MED ORDER — SODIUM CHLORIDE 0.9 % IV SOLN
INTRAVENOUS | Status: DC
Start: 2023-08-31 — End: 2023-08-31

## 2023-08-31 NOTE — Assessment & Plan Note (Addendum)
 Overall stable As above, I plan further dose reduction

## 2023-08-31 NOTE — Progress Notes (Signed)
 [facility] OFFICE PROGRESS NOTE  Patient Care Team: Roselind Congo, MD as PCP - General (Family Medicine) Oda Bence (Physician Assistant)  Assessment & Plan Bilateral primary ovarian cancer [facility]) She has recurrent ovarian cancer, platinum sensitive High grade serous, p53 mutated, Neg genetics, HRD not detected, MSI stable, low TMB 2  She is tolerated recent combination chemotherapy with carboplatin , gemcitabine  and bevacizumab  Her previous treatment was complicated by worsening blood counts, mucositis and hypertension  I reviewed CT imaging from 08/30/2023 with the patient Overall, she has stable disease control, will proceed with treatment today Even though some of the areas appear larger, overall, does not meet the criteria for progression Plan to continue treatment for 3 more cycles with plan to repeat imaging study end of June Due to mucositis and low blood count, I plan further dose reduction of gemcitabine  Pancytopenia, acquired (HCC) Overall stable As above, I plan further dose reduction Mucositis due to chemotherapy Due to treatment but not debilitating Plan dose reduction as above We will prescribe Magic mouthwash with lidocaine  swish and spit  Orders Placed This Encounter  Procedures   Total Protein, Urine dipstick    Standing Status:   Future    Expected Date:   09/14/2023    Expiration Date:   09/13/2024   CBC with Differential (Cancer Center Only)    Standing Status:   Future    Expected Date:   09/14/2023    Expiration Date:   09/13/2024   CMP (Cancer Center only)    Standing Status:   Future    Expected Date:   09/14/2023    Expiration Date:   09/13/2024   CBC with Differential (Cancer Center Only)    Standing Status:   Future    Expected Date:   09/28/2023    Expiration Date:   09/27/2024   CMP (Cancer Center only)    Standing Status:   Future    Expected Date:   09/28/2023    Expiration Date:   09/27/2024   Total Protein, Urine  dipstick    Standing Status:   Future    Expected Date:   10/12/2023    Expiration Date:   10/11/2024   CBC with Differential (Cancer Center Only)    Standing Status:   Future    Expected Date:   10/12/2023    Expiration Date:   10/11/2024   CMP (Cancer Center only)    Standing Status:   Future    Expected Date:   10/12/2023    Expiration Date:   10/11/2024   CBC with Differential (Cancer Center Only)    Standing Status:   Future    Expected Date:   10/26/2023    Expiration Date:   10/25/2024   CMP (Cancer Center only)    Standing Status:   Future    Expected Date:   10/26/2023    Expiration Date:   10/25/2024   Total Protein, Urine dipstick    Standing Status:   Future    Expected Date:   11/09/2023    Expiration Date:   11/08/2024   CBC with Differential (Cancer Center Only)    Standing Status:   Future    Expected Date:   11/09/2023    Expiration Date:   11/08/2024   CMP (Cancer Center only)    Standing Status:   Future    Expected Date:   11/09/2023    Expiration Date:   11/08/2024   CBC with Differential (  Cancer Center Only)    Standing Status:   Future    Expected Date:   11/23/2023    Expiration Date:   11/22/2024   CMP (Cancer Center only)    Standing Status:   Future    Expected Date:   11/23/2023    Expiration Date:   11/22/2024     Yolanda Jacobs, MD  INTERVAL HISTORY: she returns for treatment follow-up Complications related to previous cycle of chemotherapy included pancytopenia,, mucositis,, bone aches,, and elevated BP With recent treatment, she developed mucositis and bone aches No recent infection Denies nausea or changes in bowel habits She has lost some weight due to difficulties with eating We reviewed CT imaging today and discussed plan of care PHYSICAL EXAMINATION: ECOG PERFORMANCE STATUS: 1 - Symptomatic but completely ambulatory  Vitals:   08/31/23 0858  BP: (!) 168/96  Pulse: 68  Resp: 18  Temp: (!) 97.5 F (36.4 C)  SpO2: 100%   Filed Weights   08/31/23 0858   Weight: 183 lb 3.2 oz (83.1 kg)   On exam of her oropharynx, noted slight blister at the floor of her mouth.  No thrush Relevant data reviewed during this visit included CBC and CMP and CT imaging from August 30, 2023

## 2023-08-31 NOTE — Assessment & Plan Note (Addendum)
 Due to treatment but not debilitating Plan dose reduction as above We will prescribe Magic mouthwash with lidocaine  swish and spit

## 2023-09-01 ENCOUNTER — Other Ambulatory Visit (HOSPITAL_COMMUNITY): Payer: Self-pay

## 2023-09-11 ENCOUNTER — Other Ambulatory Visit: Payer: Self-pay | Admitting: Hematology and Oncology

## 2023-09-12 ENCOUNTER — Other Ambulatory Visit: Payer: Self-pay | Admitting: Hematology and Oncology

## 2023-09-13 ENCOUNTER — Other Ambulatory Visit: Payer: 59

## 2023-09-13 ENCOUNTER — Ambulatory Visit: Payer: 59 | Admitting: Gynecologic Oncology

## 2023-09-13 MED FILL — Fosaprepitant Dimeglumine For IV Infusion 150 MG (Base Eq): INTRAVENOUS | Qty: 5 | Status: AC

## 2023-09-14 ENCOUNTER — Other Ambulatory Visit: Payer: Self-pay | Admitting: Hematology and Oncology

## 2023-09-14 ENCOUNTER — Encounter: Payer: Self-pay | Admitting: Hematology and Oncology

## 2023-09-14 ENCOUNTER — Other Ambulatory Visit

## 2023-09-14 ENCOUNTER — Other Ambulatory Visit (HOSPITAL_COMMUNITY): Payer: Self-pay

## 2023-09-14 ENCOUNTER — Ambulatory Visit

## 2023-09-14 ENCOUNTER — Other Ambulatory Visit: Payer: Self-pay

## 2023-09-14 ENCOUNTER — Inpatient Hospital Stay: Attending: Gynecologic Oncology | Admitting: Hematology and Oncology

## 2023-09-14 VITALS — BP 155/104 | HR 73 | Temp 98.2°F | Resp 18 | Ht 68.0 in | Wt 178.2 lb

## 2023-09-14 DIAGNOSIS — Z5111 Encounter for antineoplastic chemotherapy: Secondary | ICD-10-CM | POA: Diagnosis not present

## 2023-09-14 DIAGNOSIS — K1231 Oral mucositis (ulcerative) due to antineoplastic therapy: Secondary | ICD-10-CM | POA: Diagnosis not present

## 2023-09-14 DIAGNOSIS — C563 Malignant neoplasm of bilateral ovaries: Secondary | ICD-10-CM | POA: Insufficient documentation

## 2023-09-14 DIAGNOSIS — D701 Agranulocytosis secondary to cancer chemotherapy: Secondary | ICD-10-CM | POA: Diagnosis not present

## 2023-09-14 MED ORDER — NYSTATIN 100000 UNIT/ML MT SUSP
5.0000 mL | Freq: Four times a day (QID) | OROMUCOSAL | 0 refills | Status: DC
  Filled 2023-09-14: qty 160, 8d supply, fill #0

## 2023-09-14 NOTE — Assessment & Plan Note (Addendum)
 Due to treatment but not debilitating We will prescribe Magic mouthwash with lidocaine swish and spit

## 2023-09-14 NOTE — Progress Notes (Signed)
 [facility] OFFICE PROGRESS NOTE  Patient Care Team: Roselind Congo, MD as PCP - General (Family Medicine) Oda Bence (Physician Assistant)  Assessment & Plan Bilateral primary ovarian cancer [facility]) She has recurrent ovarian cancer, platinum sensitive High grade serous, p53 mutated, Neg genetics, HRD not detected, MSI stable, low TMB 2 She has progressed on carboplatin , gemcitabine  and bevacizumab   Her previous treatment was complicated by worsening blood counts, mucositis and hypertension I reviewed CT imaging from 08/30/2023 with the patient and I believe she has true disease progression I recommend changing her treatment to either docetaxel, weekly paclitaxel  or liposomal doxorubicin We discussed risk, benefits, side effects of each option and she will call me next week for her final decision  Mucositis due to chemotherapy Due to treatment but not debilitating We will prescribe Magic mouthwash with lidocaine  swish and spit  No orders of the defined types were placed in this encounter.    Almeda Jacobs, MD  INTERVAL HISTORY: she returns for treatment follow-up Complications related to previous cycle of chemotherapy included pancytopenia,, mucositis,, and elevated BP  PHYSICAL EXAMINATION: ECOG PERFORMANCE STATUS: 1 - Symptomatic but completely ambulatory  Vitals:   09/14/23 1024  BP: (!) 155/104  Pulse: 73  Resp: 18  Temp: 98.2 F (36.8 C)  SpO2: 99%   Filed Weights   09/14/23 1024  Weight: 178 lb 3.2 oz (80.8 kg)    Relevant data reviewed during this visit included CT imaging from April 2025

## 2023-09-14 NOTE — Assessment & Plan Note (Addendum)
 She has recurrent ovarian cancer, platinum sensitive High grade serous, p53 mutated, Neg genetics, HRD not detected, MSI stable, low TMB 2 She has progressed on carboplatin , gemcitabine  and bevacizumab   Her previous treatment was complicated by worsening blood counts, mucositis and hypertension I reviewed CT imaging from 08/30/2023 with the patient and I believe she has true disease progression I recommend changing her treatment to either docetaxel, weekly paclitaxel  or liposomal doxorubicin We discussed risk, benefits, side effects of each option and she will call me next week for her final decision

## 2023-09-18 ENCOUNTER — Telehealth: Payer: Self-pay

## 2023-09-18 NOTE — Telephone Encounter (Signed)
 Called and left a message asking her to call the office back regarding her decision about treatment.

## 2023-09-19 ENCOUNTER — Other Ambulatory Visit: Payer: Self-pay | Admitting: Hematology and Oncology

## 2023-09-19 ENCOUNTER — Telehealth: Payer: Self-pay

## 2023-09-19 DIAGNOSIS — C563 Malignant neoplasm of bilateral ovaries: Secondary | ICD-10-CM

## 2023-09-19 NOTE — Telephone Encounter (Signed)
 Pt called to let us  know she has decided she would like to proceed with weekly taxol , as she had previously and states, "it seemed to work well for me last time." Advised pt I would make MD aware and we would be in touch to schedule. She verbalized thanks and understanding. Message forwarded to MD.

## 2023-09-19 NOTE — Telephone Encounter (Signed)
 Sent scheduling msg

## 2023-09-20 ENCOUNTER — Telehealth: Payer: Self-pay | Admitting: Hematology and Oncology

## 2023-09-20 NOTE — Telephone Encounter (Signed)
 [name] has been made aware of her upcoming appointments.

## 2023-09-27 ENCOUNTER — Encounter: Payer: Self-pay | Admitting: Hematology and Oncology

## 2023-09-27 ENCOUNTER — Inpatient Hospital Stay

## 2023-09-27 ENCOUNTER — Other Ambulatory Visit (HOSPITAL_COMMUNITY): Payer: Self-pay

## 2023-09-27 ENCOUNTER — Other Ambulatory Visit: Payer: Self-pay

## 2023-09-27 ENCOUNTER — Inpatient Hospital Stay (HOSPITAL_BASED_OUTPATIENT_CLINIC_OR_DEPARTMENT_OTHER): Admitting: Hematology and Oncology

## 2023-09-27 VITALS — BP 160/104 | HR 66 | Temp 97.7°F | Resp 18 | Ht 68.0 in | Wt 175.4 lb

## 2023-09-27 DIAGNOSIS — Z5111 Encounter for antineoplastic chemotherapy: Secondary | ICD-10-CM | POA: Diagnosis not present

## 2023-09-27 DIAGNOSIS — D701 Agranulocytosis secondary to cancer chemotherapy: Secondary | ICD-10-CM | POA: Diagnosis not present

## 2023-09-27 DIAGNOSIS — Z95828 Presence of other vascular implants and grafts: Secondary | ICD-10-CM

## 2023-09-27 DIAGNOSIS — C563 Malignant neoplasm of bilateral ovaries: Secondary | ICD-10-CM

## 2023-09-27 DIAGNOSIS — Z720 Tobacco use: Secondary | ICD-10-CM | POA: Diagnosis not present

## 2023-09-27 DIAGNOSIS — T451X5A Adverse effect of antineoplastic and immunosuppressive drugs, initial encounter: Secondary | ICD-10-CM | POA: Diagnosis not present

## 2023-09-27 LAB — CBC WITH DIFFERENTIAL (CANCER CENTER ONLY)
Abs Immature Granulocytes: 0.01 10*3/uL (ref 0.00–0.07)
Basophils Absolute: 0 10*3/uL (ref 0.0–0.1)
Basophils Relative: 1 %
Eosinophils Absolute: 0.1 10*3/uL (ref 0.0–0.5)
Eosinophils Relative: 4 %
HCT: 38.8 % (ref 36.0–46.0)
Hemoglobin: 13.4 g/dL (ref 12.0–15.0)
Immature Granulocytes: 0 %
Lymphocytes Relative: 31 %
Lymphs Abs: 1.1 10*3/uL (ref 0.7–4.0)
MCH: 34.9 pg — ABNORMAL HIGH (ref 26.0–34.0)
MCHC: 34.5 g/dL (ref 30.0–36.0)
MCV: 101 fL — ABNORMAL HIGH (ref 80.0–100.0)
Monocytes Absolute: 0.6 10*3/uL (ref 0.1–1.0)
Monocytes Relative: 18 %
Neutro Abs: 1.6 10*3/uL — ABNORMAL LOW (ref 1.7–7.7)
Neutrophils Relative %: 46 %
Platelet Count: 279 10*3/uL (ref 150–400)
RBC: 3.84 MIL/uL — ABNORMAL LOW (ref 3.87–5.11)
RDW: 15.4 % (ref 11.5–15.5)
WBC Count: 3.6 10*3/uL — ABNORMAL LOW (ref 4.0–10.5)
nRBC: 0 % (ref 0.0–0.2)

## 2023-09-27 LAB — CMP (CANCER CENTER ONLY)
ALT: 20 U/L (ref 0–44)
AST: 21 U/L (ref 15–41)
Albumin: 4.4 g/dL (ref 3.5–5.0)
Alkaline Phosphatase: 78 U/L (ref 38–126)
Anion gap: 7 (ref 5–15)
BUN: 20 mg/dL (ref 8–23)
CO2: 27 mmol/L (ref 22–32)
Calcium: 9.6 mg/dL (ref 8.9–10.3)
Chloride: 107 mmol/L (ref 98–111)
Creatinine: 0.97 mg/dL (ref 0.44–1.00)
GFR, Estimated: >60 mL/min (ref >60)
Glucose, Bld: 167 mg/dL — ABNORMAL HIGH (ref 70–99)
Potassium: 4 mmol/L (ref 3.5–5.1)
Sodium: 141 mmol/L (ref 135–145)
Total Bilirubin: 0.4 mg/dL (ref 0.0–1.2)
Total Protein: 7.6 g/dL (ref 6.5–8.1)

## 2023-09-27 MED ORDER — NYSTATIN 100000 UNIT/ML MT SUSP
5.0000 mL | Freq: Four times a day (QID) | OROMUCOSAL | 0 refills | Status: DC
  Filled 2023-09-27: qty 160, 8d supply, fill #0

## 2023-09-27 MED ORDER — SODIUM CHLORIDE 0.9 % IV SOLN
80.0000 mg/m2 | Freq: Once | INTRAVENOUS | Status: AC
  Administered 2023-09-27: 156 mg via INTRAVENOUS
  Filled 2023-09-27: qty 26

## 2023-09-27 MED ORDER — FAMOTIDINE IN NACL 20-0.9 MG/50ML-% IV SOLN
20.0000 mg | Freq: Once | INTRAVENOUS | Status: AC
  Administered 2023-09-27: 20 mg via INTRAVENOUS
  Filled 2023-09-27: qty 50

## 2023-09-27 MED ORDER — DEXAMETHASONE SODIUM PHOSPHATE 10 MG/ML IJ SOLN
10.0000 mg | Freq: Once | INTRAMUSCULAR | Status: AC
  Administered 2023-09-27: 10 mg via INTRAVENOUS
  Filled 2023-09-27: qty 1

## 2023-09-27 MED ORDER — SODIUM CHLORIDE 0.9% FLUSH
10.0000 mL | Freq: Once | INTRAVENOUS | Status: AC
  Administered 2023-09-27: 10 mL

## 2023-09-27 MED ORDER — HEPARIN SOD (PORK) LOCK FLUSH 100 UNIT/ML IV SOLN: 500.0000 [IU] | Freq: Once | INTRAVENOUS | Status: DC | PRN

## 2023-09-27 MED ORDER — NYSTATIN 100000 UNIT/ML MT SUSP: 5.0000 mL | Freq: Four times a day (QID) | OROMUCOSAL | 0 refills | Status: DC

## 2023-09-27 MED ORDER — SODIUM CHLORIDE 0.9 % IV SOLN: INTRAVENOUS | Status: DC

## 2023-09-27 MED ORDER — SODIUM CHLORIDE 0.9% FLUSH: 10.0000 mL | INTRAVENOUS | Status: DC | PRN

## 2023-09-27 NOTE — Assessment & Plan Note (Addendum)
 Will monitor closely She is not symptomatic

## 2023-09-27 NOTE — Progress Notes (Signed)
 Called patient to follow up on concerns after receiving chat message.  Re-introduced myself and addressed her concerns and provided feedback. Message sent to social worker for follow up as well. Emailed patient some information which she requested.  She was very Adult nurse.  Will have registration provide my card should she have any additional financial questions or concerns.

## 2023-09-27 NOTE — Assessment & Plan Note (Addendum)
 She has recurrent ovarian cancer, platinum sensitive High grade serous, p53 mutated, Neg genetics, HRD not detected, MSI stable, low TMB 2 She has progressed on carboplatin , gemcitabine  and bevacizumab   Her previous treatment was complicated by worsening blood counts, mucositis and hypertension I reviewed CT imaging from 08/30/2023 with the patient and I believe she has true disease progression She is starting cycle 1 of weekly Taxol  We discussed side effects to be expected

## 2023-09-27 NOTE — Progress Notes (Signed)
  [facility] OFFICE PROGRESS NOTE  Patient Care Team: Roselind Congo, MD as PCP - General (Family Medicine) Oda Bence (Physician Assistant)  Assessment & Plan Bilateral primary ovarian cancer [facility]) [name] has recurrent ovarian cancer, platinum sensitive High grade serous, p53 mutated, Neg genetics, HRD not detected, MSI stable, low TMB 2 [name] has progressed on carboplatin , gemcitabine  and bevacizumab   Her previous treatment was complicated by worsening blood counts, mucositis and hypertension I reviewed CT imaging from 08/30/2023 with the patient and I believe [name] has true disease progression [name] is starting cycle 1 of weekly Taxol  We discussed side effects to be expected  Leukopenia due to antineoplastic chemotherapy (HCC) Will monitor closely [name] is not symptomatic Tobacco abuse We discussed importance of smoking cessation  No orders of the defined types were placed in this encounter.    [facility]ol[facility] Jacobs, MD  INTERVAL HISTORY: [name] returns for treatment follow-up to start cycle 1 of Taxol  [name] complained of fatigue No recent infection [name] continues to smoke sporadically  PHYSICAL EXAMINATION: ECOG PERFORMANCE STATUS: 1 - Symptomatic but completely ambulatory  Lab Results  Component Value Date   CAN125 16.1 08/17/2023   CAN125 33.2 07/12/2023   CAN125 32.9 06/15/2023      Latest Ref Rng & Units 09/27/2023    8:16 AM 08/30/2023   11:27 AM 08/17/2023    9:17 AM  CBC  WBC 4.0 - 10.5 K/uL 3.6  3.3  3.3   Hemoglobin 12.0 - 15.0 g/dL 16.1  09.6  04.5   Hematocrit 36.0 - 46.0 % 38.8  34.1  34.8   Platelets 150 - 400 K/uL 279  169  269       Chemistry      Component Value Date/Time   NA 141 09/27/2023 0816   K 4.0 09/27/2023 0816   CL 107 09/27/2023 0816   CO2 27 09/27/2023 0816   BUN 20 09/27/2023 0816   CREATININE 0.97 09/27/2023 0816      Component Value Date/Time   CALCIUM 9.6 09/27/2023 0816   ALKPHOS 78 09/27/2023 0816   AST 21  09/27/2023 0816   ALT 20 09/27/2023 0816   BILITOT 0.4 09/27/2023 0816       Vitals:   09/27/23 0849  BP: (!) 160/104  Pulse: 66  Resp: 18  Temp: 97.7 F (36.5 C)  SpO2: 100%   Filed Weights   09/27/23 0849  Weight: 175 lb 6.4 oz (79.6 kg)   Other relevant data reviewed during this visit included CBC and CMP

## 2023-09-27 NOTE — Patient Instructions (Signed)

## 2023-09-27 NOTE — Assessment & Plan Note (Addendum)
We discussed importance of smoking cessation 

## 2023-09-28 ENCOUNTER — Other Ambulatory Visit

## 2023-09-28 ENCOUNTER — Ambulatory Visit

## 2023-09-28 ENCOUNTER — Ambulatory Visit: Admitting: Hematology and Oncology

## 2023-09-28 LAB — CA 125: Cancer Antigen (CA) 125: 29.8 U/mL (ref 0.0–38.1)

## 2023-10-03 ENCOUNTER — Encounter: Payer: Self-pay | Admitting: Hematology and Oncology

## 2023-10-04 ENCOUNTER — Inpatient Hospital Stay

## 2023-10-04 ENCOUNTER — Inpatient Hospital Stay: Admitting: Hematology and Oncology

## 2023-10-04 ENCOUNTER — Telehealth: Payer: Self-pay

## 2023-10-04 ENCOUNTER — Encounter: Payer: Self-pay | Admitting: Hematology and Oncology

## 2023-10-04 ENCOUNTER — Inpatient Hospital Stay: Admitting: Licensed Clinical Social Worker

## 2023-10-04 VITALS — BP 159/90 | HR 66 | Temp 97.4°F | Resp 18 | Ht 68.0 in | Wt 174.4 lb

## 2023-10-04 DIAGNOSIS — C563 Malignant neoplasm of bilateral ovaries: Secondary | ICD-10-CM

## 2023-10-04 DIAGNOSIS — Z95828 Presence of other vascular implants and grafts: Secondary | ICD-10-CM

## 2023-10-04 DIAGNOSIS — G620 Drug-induced polyneuropathy: Secondary | ICD-10-CM | POA: Diagnosis not present

## 2023-10-04 DIAGNOSIS — D61818 Other pancytopenia: Secondary | ICD-10-CM

## 2023-10-04 DIAGNOSIS — D701 Agranulocytosis secondary to cancer chemotherapy: Secondary | ICD-10-CM | POA: Diagnosis not present

## 2023-10-04 DIAGNOSIS — T451X5A Adverse effect of antineoplastic and immunosuppressive drugs, initial encounter: Secondary | ICD-10-CM

## 2023-10-04 DIAGNOSIS — Z5111 Encounter for antineoplastic chemotherapy: Secondary | ICD-10-CM | POA: Diagnosis not present

## 2023-10-04 LAB — CBC WITH DIFFERENTIAL (CANCER CENTER ONLY)
Abs Immature Granulocytes: 0.01 10*3/uL (ref 0.00–0.07)
Basophils Absolute: 0 10*3/uL (ref 0.0–0.1)
Basophils Relative: 1 %
Eosinophils Absolute: 0.2 10*3/uL (ref 0.0–0.5)
Eosinophils Relative: 6 %
HCT: 35.1 % — ABNORMAL LOW (ref 36.0–46.0)
Hemoglobin: 12 g/dL (ref 12.0–15.0)
Immature Granulocytes: 0 %
Lymphocytes Relative: 44 %
Lymphs Abs: 1.3 10*3/uL (ref 0.7–4.0)
MCH: 35 pg — ABNORMAL HIGH (ref 26.0–34.0)
MCHC: 34.2 g/dL (ref 30.0–36.0)
MCV: 102.3 fL — ABNORMAL HIGH (ref 80.0–100.0)
Monocytes Absolute: 0.4 10*3/uL (ref 0.1–1.0)
Monocytes Relative: 11 %
Neutro Abs: 1.2 10*3/uL — ABNORMAL LOW (ref 1.7–7.7)
Neutrophils Relative %: 38 %
Platelet Count: 188 10*3/uL (ref 150–400)
RBC: 3.43 MIL/uL — ABNORMAL LOW (ref 3.87–5.11)
RDW: 14.5 % (ref 11.5–15.5)
WBC Count: 3.1 10*3/uL — ABNORMAL LOW (ref 4.0–10.5)
nRBC: 0 % (ref 0.0–0.2)

## 2023-10-04 LAB — CMP (CANCER CENTER ONLY)
ALT: 25 U/L (ref 0–44)
AST: 19 U/L (ref 15–41)
Albumin: 4.2 g/dL (ref 3.5–5.0)
Alkaline Phosphatase: 71 U/L (ref 38–126)
Anion gap: 5 (ref 5–15)
BUN: 24 mg/dL — ABNORMAL HIGH (ref 8–23)
CO2: 30 mmol/L (ref 22–32)
Calcium: 9.4 mg/dL (ref 8.9–10.3)
Chloride: 107 mmol/L (ref 98–111)
Creatinine: 0.95 mg/dL (ref 0.44–1.00)
GFR, Estimated: >60 mL/min (ref >60)
Glucose, Bld: 122 mg/dL — ABNORMAL HIGH (ref 70–99)
Potassium: 4.1 mmol/L (ref 3.5–5.1)
Sodium: 142 mmol/L (ref 135–145)
Total Bilirubin: 0.4 mg/dL (ref 0.0–1.2)
Total Protein: 7.1 g/dL (ref 6.5–8.1)

## 2023-10-04 LAB — VITAMIN B12: Vitamin B-12: 959 pg/mL — ABNORMAL HIGH (ref 180–914)

## 2023-10-04 MED ORDER — SODIUM CHLORIDE 0.9 % IV SOLN: INTRAVENOUS | Status: DC

## 2023-10-04 MED ORDER — DEXAMETHASONE SODIUM PHOSPHATE 10 MG/ML IJ SOLN
10.0000 mg | Freq: Once | INTRAMUSCULAR | Status: AC
  Administered 2023-10-04: 10 mg via INTRAVENOUS
  Filled 2023-10-04: qty 1

## 2023-10-04 MED ORDER — SODIUM CHLORIDE 0.9% FLUSH
10.0000 mL | Freq: Once | INTRAVENOUS | Status: AC
  Administered 2023-10-04: 10 mL

## 2023-10-04 MED ORDER — FAMOTIDINE IN NACL 20-0.9 MG/50ML-% IV SOLN
20.0000 mg | Freq: Once | INTRAVENOUS | Status: AC
  Administered 2023-10-04: 20 mg via INTRAVENOUS
  Filled 2023-10-04: qty 50

## 2023-10-04 MED ORDER — SODIUM CHLORIDE 0.9% FLUSH
10.0000 mL | INTRAVENOUS | Status: DC | PRN
  Administered 2023-10-04: 10 mL

## 2023-10-04 MED ORDER — HEPARIN SOD (PORK) LOCK FLUSH 100 UNIT/ML IV SOLN
500.0000 [IU] | Freq: Once | INTRAVENOUS | Status: AC | PRN
  Administered 2023-10-04: 500 [IU]

## 2023-10-04 MED ORDER — SODIUM CHLORIDE 0.9 % IV SOLN
80.0000 mg/m2 | Freq: Once | INTRAVENOUS | Status: AC
  Administered 2023-10-04: 156 mg via INTRAVENOUS
  Filled 2023-10-04: qty 26

## 2023-10-04 NOTE — Assessment & Plan Note (Addendum)
 She has recurrent ovarian cancer, platinum sensitive High grade serous, p53 mutated, Neg genetics, HRD not detected, MSI stable, low TMB 2 She has progressed on carboplatin , gemcitabine  and bevacizumab   CT imaging from April showed worsening disease and her treatment is switched to paclitaxel  She tolerated paclitaxel  well without worsening neuropathy but she is now noted to have progressive severe pancytopenia We discussed risk and benefits of proceeding with treatment today and she agreed I will cancel her treatment next week as I anticipate worsening pancytopenia and I will see her in 2 weeks In the future, I will modify her treatment to allow treatment break after every 2 cycles of treatment

## 2023-10-04 NOTE — Progress Notes (Signed)
  Cancer Center OFFICE PROGRESS NOTE  Patient Care Team: Yolanda Congo, MD as PCP - General (Family Medicine) Oda Bence (Physician Assistant)  Assessment & Plan Bilateral primary ovarian cancer [facility]) She has recurrent ovarian cancer, platinum sensitive High grade serous, p53 mutated, Neg genetics, HRD not detected, MSI stable, low TMB 2 She has progressed on carboplatin , gemcitabine  and bevacizumab   CT imaging from April showed worsening disease and her treatment is switched to paclitaxel  She tolerated paclitaxel  well without worsening neuropathy but she is now noted to have progressive severe pancytopenia We discussed risk and benefits of proceeding with treatment today and she agreed I will cancel her treatment next week as I anticipate worsening pancytopenia and I will see her in 2 weeks In the future, I will modify her treatment to allow treatment break after every 2 cycles of treatment   Peripheral neuropathy due to chemotherapy [facility]) She denies worsening peripheral neuropathy Continue close surveillance Leukopenia due to antineoplastic chemotherapy [facility]) She has worsening neutropenia but not symptomatic We discussed risk and benefits of proceeding with treatment and she agreed She is aware of risk of infection We will cancel her treatment next week and modify future treatment In addition, I will order vitamin B12 level to rule out B12 deficiency 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  Orders Placed This Encounter  Procedures   Vitamin B12    Standing Status:   Future    Number of Occurrences:   1    Expected Date:   10/04/2023    Expiration Date:   10/03/2024   CBC with Differential (Cancer Center Only)    Standing Status:   Future    Expected Date:   12/13/2023    Expiration Date:   12/12/2024   CMP (Cancer Center only)    Standing Status:   Future    Expected Date:   12/13/2023    Expiration Date:   12/12/2024   CBC with Differential (Cancer Center  Only)    Standing Status:   Future    Expected Date:   12/20/2023    Expiration Date:   12/19/2024   CMP (Cancer Center only)    Standing Status:   Future    Expected Date:   12/20/2023    Expiration Date:   12/19/2024   CBC with Differential (Cancer Center Only)    Standing Status:   Future    Expected Date:   01/03/2024    Expiration Date:   01/02/2025   CMP (Cancer Center only)    Standing Status:   Future    Expected Date:   01/03/2024    Expiration Date:   01/02/2025   CBC with Differential (Cancer Center Only)    Standing Status:   Future    Expected Date:   01/10/2024    Expiration Date:   01/09/2025   CMP (Cancer Center only)    Standing Status:   Future    Expected Date:   01/10/2024    Expiration Date:   01/09/2025   CBC with Differential (Cancer Center Only)    Standing Status:   Future    Expected Date:   11/01/2023    Expiration Date:   10/31/2024   CMP (Cancer Center only)    Standing Status:   Future    Expected Date:   11/01/2023    Expiration Date:   10/31/2024   CBC with Differential (Cancer Center Only)    Standing Status:   Future  Expected Date:   11/08/2023    Expiration Date:   11/07/2024   CMP (Cancer Center only)    Standing Status:   Future    Expected Date:   11/08/2023    Expiration Date:   11/07/2024   CBC with Differential (Cancer Center Only)    Standing Status:   Future    Expected Date:   11/22/2023    Expiration Date:   11/21/2024   CMP (Cancer Center only)    Standing Status:   Future    Expected Date:   11/22/2023    Expiration Date:   11/21/2024   CBC with Differential (Cancer Center Only)    Standing Status:   Future    Expected Date:   11/29/2023    Expiration Date:   11/28/2024   CMP (Cancer Center only)    Standing Status:   Future    Expected Date:   11/29/2023    Expiration Date:   11/28/2024     Almeda Jacobs, MD  INTERVAL HISTORY: she returns for treatment follow-up Complications related to previous cycle of chemotherapy included leukopenia,,  peripheral neuropathy,, and fatigue, Her peripheral neuropathy is stable She denies recent infection  PHYSICAL EXAMINATION: ECOG PERFORMANCE STATUS: 1 - Symptomatic but completely ambulatory  Lab Results  Component Value Date   CAN125 29.8 09/27/2023   CAN125 16.1 08/17/2023   CAN125 33.2 07/12/2023      Latest Ref Rng & Units 10/04/2023    8:32 AM 09/27/2023    8:16 AM 08/30/2023   11:27 AM  CBC  WBC 4.0 - 10.5 K/uL 3.1  3.6  3.3   Hemoglobin 12.0 - 15.0 g/dL 11.9  14.7  82.9   Hematocrit 36.0 - 46.0 % 35.1  38.8  34.1   Platelets 150 - 400 K/uL 188  279  169       Chemistry      Component Value Date/Time   NA 141 09/27/2023 0816   K 4.0 09/27/2023 0816   CL 107 09/27/2023 0816   CO2 27 09/27/2023 0816   BUN 20 09/27/2023 0816   CREATININE 0.97 09/27/2023 0816      Component Value Date/Time   CALCIUM 9.6 09/27/2023 0816   ALKPHOS 78 09/27/2023 0816   AST 21 09/27/2023 0816   ALT 20 09/27/2023 0816   BILITOT 0.4 09/27/2023 0816       Vitals:   10/04/23 0851  BP: (!) 159/90  Pulse: 66  Resp: 18  Temp: (!) 97.4 F (36.3 C)  SpO2: 100%   Filed Weights   10/04/23 0851  Weight: 174 lb 6.4 oz (79.1 kg)   Other relevant data reviewed during this visit included CBC, CMP and vitamin B12

## 2023-10-04 NOTE — Assessment & Plan Note (Addendum)
 She denies worsening peripheral neuropathy Continue close surveillance

## 2023-10-04 NOTE — Telephone Encounter (Signed)
 Called and given below message. She verbalized understanding.

## 2023-10-04 NOTE — Assessment & Plan Note (Addendum)
 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

## 2023-10-04 NOTE — Patient Instructions (Signed)
 [facility] - A DEPT OF [facility]  Discharge Instructions: Thank you for choosing [facility] to provide your oncology and hematology care.   If you have a lab appointment with the Cancer Center, please go directly to the Cancer Center and check in at the registration area.   Wear comfortable clothing and clothing appropriate for easy access to any Portacath or PICC line.   We strive to give you quality time with your provider. You may need to reschedule your appointment if you arrive late (15 or more minutes).  Arriving late affects you and other patients whose appointments are after yours.  Also, if you miss three or more appointments without notifying the office, you may be dismissed from the clinic at the provider's discretion.      For prescription refill requests, have your pharmacy contact our office and allow 72 hours for refills to be completed.    Today you received the following chemotherapy and/or immunotherapy agents :  Paclitaxel      To help prevent nausea and vomiting after your treatment, we encourage you to take your nausea medication as directed.  BELOW ARE SYMPTOMS THAT SHOULD BE REPORTED IMMEDIATELY: *FEVER GREATER THAN 100.4 F (38 C) OR HIGHER *CHILLS OR SWEATING *NAUSEA AND VOMITING THAT IS NOT CONTROLLED WITH YOUR NAUSEA MEDICATION *UNUSUAL SHORTNESS OF BREATH *UNUSUAL BRUISING OR BLEEDING *URINARY PROBLEMS (pain or burning when urinating, or frequent urination) *BOWEL PROBLEMS (unusual diarrhea, constipation, pain near the anus) TENDERNESS IN MOUTH AND THROAT WITH OR WITHOUT PRESENCE OF ULCERS (sore throat, sores in mouth, or a toothache) UNUSUAL RASH, SWELLING OR PAIN  UNUSUAL VAGINAL DISCHARGE OR ITCHING   Items with * indicate a potential emergency and should be followed up as soon as possible or go to the Emergency Department if any problems should occur.  Please show the CHEMOTHERAPY ALERT CARD or  IMMUNOTHERAPY ALERT CARD at check-in to the Emergency Department and triage nurse.  Should you have questions after your visit or need to cancel or reschedule your appointment, please contact [facility] - A DEPT OF [facility]  Dept: [phone]  and follow the prompts.  Office hours are 8:00 a.m. to 4:30 p.m. Monday - Friday. Please note that voicemails left after 4:00 p.m. may not be returned until the following business day.  We are closed weekends and major holidays. You have access to a nurse at all times for urgent questions. Please call the main number to the clinic Dept: [phone] and follow the prompts.   For any non-urgent questions, you may also contact your provider using MyChart. We now offer e-Visits for anyone 45 and older to request care online for non-urgent symptoms. For details visit [email or web address].   Also download the MyChart app! Go to the app store, search "MyChart", open the app, select [place], and log in with your MyChart username and password.

## 2023-10-04 NOTE — Telephone Encounter (Signed)
-----   Message from [name] sent at 10/04/2023 11:07 AM EDT ----- Vitamin b12 level is good, please let her know Her low blood count is due to chemo

## 2023-10-04 NOTE — Progress Notes (Signed)
 [facility] Clinical Social Work  Initial Assessment   [name] is a 68 y.o. year old female presenting alone. Clinical Social Work was referred by self for financial assistance.  SDOH (Social Determinants of Health) assessments performed: Yes SDOH Interventions    Flowsheet Row Clinical Support from 10/04/2023 in [facility] - A Dept Of [place] Admission (Discharged) from 08/16/2022 in [facility] [place] [facility]  SDOH Interventions    Food Insecurity Interventions Community Resources Provided, Other (Comment)  [facility] food pantry] --  Housing Interventions Other (Comment)  [Cancer foundations] Intervention Not Indicated  Transportation Interventions Payor Benefit, SCAT (Specialized Community Area Transporation) --       SDOH Screenings   Food Insecurity: Food Insecurity Present (10/04/2023)  Housing: High Risk (10/04/2023)  Transportation Needs: Unmet Transportation Needs (10/04/2023)  Utilities: Not At Risk (08/17/2022)  Tobacco Use: High Risk (10/04/2023)     Distress Screen completed: No     No data to display            Family/Social Information:  Housing Arrangement: patient lives alone. She rents an apartment Family members/support persons in your life? Family Transportation concerns: yes, car needs repairs which she cannot afford (after already having significant repair work done)  Employment: Legally disabled .  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 Current coping skills/ strengths: Capable of independent living , Motivation for treatment/growth , Supportive family/friends , and Other: ability to access community resources    SUMMARY: Current SDOH Barriers:  Financial constraints related to increased expenses/ car repair needs  Clinical Social Work Clinical Goal(s):  Explore community resource options for unmet needs related to:  Financial Strain   Interventions: Provided CSW contact information and encouraged patient to call with any questions or concerns Referred patient to [employer] Began application to [employer] for [employer]- waiting on pt's proof of income to submit complete application Provided information about the Information systems manager and how to apply on the 1st business day of the month Provided bag of food from [employer]   Follow Up Plan: Patient will send CSW proof of income for [employer] [facility] application. Pt will notify CSW if she has questions about other applications Patient verbalizes understanding of plan: Yes    Lanyla Costello E Tu Shimmel, LCSW Clinical Social Worker [facility]

## 2023-10-05 ENCOUNTER — Encounter: Payer: Self-pay | Admitting: Licensed Clinical Social Worker

## 2023-10-05 NOTE — Progress Notes (Signed)
 [facility] Clinical Social Work  Pt sent proof of income to CSW. CSW submitted [employer] [facility] application with supporting documents. Reminded pt of [facility] application opening on Monday.   Marna Weniger E Desa Rech, LCSW

## 2023-10-08 ENCOUNTER — Encounter: Payer: Self-pay | Admitting: Licensed Clinical Social Worker

## 2023-10-08 NOTE — Progress Notes (Signed)
 [facility] CSW Progress Note  Clinical Child psychotherapist received TC from patient with confusion over applications for assistance. CSW provided clarification for patient and instructed on next steps.  [employer] for [facility]- referral was sent in by CSW on 10/05/23. They will contact pt once it has been reviewed. [employer]- pt will call at 1pm today to apply [employer]- pt received call from Graham with [employer] and application was e-mailed to her but she cannot access it. CSW gave pt phone number so she can apply over the phone or ask for a mailed hard copy.     Iesha Summerhill E Lilyauna Miedema, LCSW Clinical Social Worker [employer]

## 2023-10-11 ENCOUNTER — Ambulatory Visit: Admitting: Hematology and Oncology

## 2023-10-11 ENCOUNTER — Ambulatory Visit

## 2023-10-11 ENCOUNTER — Other Ambulatory Visit

## 2023-10-12 ENCOUNTER — Ambulatory Visit

## 2023-10-12 ENCOUNTER — Other Ambulatory Visit

## 2023-10-12 ENCOUNTER — Ambulatory Visit: Admitting: Hematology and Oncology

## 2023-10-18 ENCOUNTER — Inpatient Hospital Stay

## 2023-10-18 ENCOUNTER — Encounter: Payer: Self-pay | Admitting: Hematology and Oncology

## 2023-10-18 ENCOUNTER — Inpatient Hospital Stay: Attending: Gynecologic Oncology | Admitting: Hematology and Oncology

## 2023-10-18 DIAGNOSIS — D701 Agranulocytosis secondary to cancer chemotherapy: Secondary | ICD-10-CM | POA: Insufficient documentation

## 2023-10-18 DIAGNOSIS — G620 Drug-induced polyneuropathy: Secondary | ICD-10-CM | POA: Insufficient documentation

## 2023-10-18 DIAGNOSIS — K122 Cellulitis and abscess of mouth: Secondary | ICD-10-CM

## 2023-10-18 DIAGNOSIS — C563 Malignant neoplasm of bilateral ovaries: Secondary | ICD-10-CM | POA: Diagnosis not present

## 2023-10-18 DIAGNOSIS — Z5111 Encounter for antineoplastic chemotherapy: Secondary | ICD-10-CM | POA: Diagnosis not present

## 2023-10-18 DIAGNOSIS — T451X5A Adverse effect of antineoplastic and immunosuppressive drugs, initial encounter: Secondary | ICD-10-CM | POA: Diagnosis not present

## 2023-10-18 DIAGNOSIS — Z95828 Presence of other vascular implants and grafts: Secondary | ICD-10-CM

## 2023-10-18 LAB — CMP (CANCER CENTER ONLY)
ALT: 23 U/L (ref 0–44)
AST: 25 U/L (ref 15–41)
Albumin: 3.9 g/dL (ref 3.5–5.0)
Alkaline Phosphatase: 80 U/L (ref 38–126)
Anion gap: 10 (ref 5–15)
BUN: 15 mg/dL (ref 8–23)
CO2: 25 mmol/L (ref 22–32)
Calcium: 8.8 mg/dL — ABNORMAL LOW (ref 8.9–10.3)
Chloride: 101 mmol/L (ref 98–111)
Creatinine: 0.98 mg/dL (ref 0.44–1.00)
GFR, Estimated: >60 mL/min (ref >60)
Glucose, Bld: 129 mg/dL — ABNORMAL HIGH (ref 70–99)
Potassium: 3.8 mmol/L (ref 3.5–5.1)
Sodium: 136 mmol/L (ref 135–145)
Total Bilirubin: 0.7 mg/dL (ref 0.0–1.2)
Total Protein: 6.8 g/dL (ref 6.5–8.1)

## 2023-10-18 LAB — CBC WITH DIFFERENTIAL (CANCER CENTER ONLY)
Abs Immature Granulocytes: 0.01 10*3/uL (ref 0.00–0.07)
Basophils Absolute: 0 10*3/uL (ref 0.0–0.1)
Basophils Relative: 1 %
Eosinophils Absolute: 0.1 10*3/uL (ref 0.0–0.5)
Eosinophils Relative: 2 %
HCT: 33.6 % — ABNORMAL LOW (ref 36.0–46.0)
Hemoglobin: 11.5 g/dL — ABNORMAL LOW (ref 12.0–15.0)
Immature Granulocytes: 0 %
Lymphocytes Relative: 38 %
Lymphs Abs: 1.1 10*3/uL (ref 0.7–4.0)
MCH: 35.1 pg — ABNORMAL HIGH (ref 26.0–34.0)
MCHC: 34.2 g/dL (ref 30.0–36.0)
MCV: 102.4 fL — ABNORMAL HIGH (ref 80.0–100.0)
Monocytes Absolute: 0.6 10*3/uL (ref 0.1–1.0)
Monocytes Relative: 20 %
Neutro Abs: 1.2 10*3/uL — ABNORMAL LOW (ref 1.7–7.7)
Neutrophils Relative %: 39 %
Platelet Count: 241 10*3/uL (ref 150–400)
RBC: 3.28 MIL/uL — ABNORMAL LOW (ref 3.87–5.11)
RDW: 13.5 % (ref 11.5–15.5)
WBC Count: 3 10*3/uL — ABNORMAL LOW (ref 4.0–10.5)
nRBC: 0 % (ref 0.0–0.2)

## 2023-10-18 MED ORDER — AMOXICILLIN 500 MG PO TABS: 500.0000 mg | ORAL_TABLET | Freq: Two times a day (BID) | ORAL | 0 refills | Status: DC

## 2023-10-18 MED ORDER — SODIUM CHLORIDE 0.9% FLUSH
10.0000 mL | Freq: Once | INTRAVENOUS | Status: AC
  Administered 2023-10-18: 10 mL

## 2023-10-18 NOTE — Progress Notes (Signed)
 [facility] OFFICE PROGRESS NOTE  Patient Care Team: Roselind Congo, MD as PCP - General (Family Medicine) Oda Bence (Physician Assistant)  Assessment & Plan Bilateral primary ovarian cancer [facility]) She has recurrent ovarian cancer, platinum sensitive High grade serous, p53 mutated, Neg genetics, HRD not detected, MSI stable, low TMB 2 She has progressed on carboplatin , gemcitabine  and bevacizumab   CT imaging from April showed worsening disease and her treatment is switched to paclitaxel  She tolerated paclitaxel  well without worsening neuropathy but she is now noted to have progressive severe pancytopenia Treatment was recently modified to every other week Unfortunately, now she developed oral infection We will cancel treatment today I will prescribe antibiotics and will delay her treatment for another 2 weeks   Oral infection She has signs of oral bacterial infection I recommend canceling treatment today Will put her on a course of amoxicillin for a week If it is not improved by Monday next week, she will call us  and also schedule an appointment to see her dentist Peripheral neuropathy due to chemotherapy [facility]) She denies worsening peripheral neuropathy Continue close surveillance  No orders of the defined types were placed in this encounter.    Almeda Jacobs, MD  INTERVAL HISTORY: she returns for treatment follow-up Complications related to previous cycle of chemotherapy included pancytopenia,, peripheral neuropathy,, and infection, She denies worsening peripheral neuropathy Approximately a week ago, she noted worsening pain, blister and swelling in her gums that appears to be spreading to her in the jaw area It is associated with pain She denies fever or chills  PHYSICAL EXAMINATION: ECOG PERFORMANCE STATUS: 1 - Symptomatic but completely ambulatory  Lab Results  Component Value Date   CAN125 29.8 09/27/2023   CAN125 16.1 08/17/2023    CAN125 33.2 07/12/2023      Latest Ref Rng & Units 10/18/2023   12:12 PM 10/04/2023    8:32 AM 09/27/2023    8:16 AM  CBC  WBC 4.0 - 10.5 K/uL 3.0  3.1  3.6   Hemoglobin 12.0 - 15.0 g/dL 96.0  45.4  09.8   Hematocrit 36.0 - 46.0 % 33.6  35.1  38.8   Platelets 150 - 400 K/uL 241  188  279       Chemistry      Component Value Date/Time   NA 142 10/04/2023 0832   K 4.1 10/04/2023 0832   CL 107 10/04/2023 0832   CO2 30 10/04/2023 0832   BUN 24 (H) 10/04/2023 0832   CREATININE 0.95 10/04/2023 0832      Component Value Date/Time   CALCIUM 9.4 10/04/2023 0832   ALKPHOS 71 10/04/2023 0832   AST 19 10/04/2023 0832   ALT 25 10/04/2023 0832   BILITOT 0.4 10/04/2023 1191     Examination of her oral cavity revealed signs of blister and swelling of her lower gum consistent with oral infection  Other relevant data reviewed during this visit included CBC and CMP

## 2023-10-18 NOTE — Assessment & Plan Note (Addendum)
 She has recurrent ovarian cancer, platinum sensitive High grade serous, p53 mutated, Neg genetics, HRD not detected, MSI stable, low TMB 2 She has progressed on carboplatin , gemcitabine  and bevacizumab   CT imaging from April showed worsening disease and her treatment is switched to paclitaxel  She tolerated paclitaxel  well without worsening neuropathy but she is now noted to have progressive severe pancytopenia Treatment was recently modified to every other week Unfortunately, now she developed oral infection We will cancel treatment today I will prescribe antibiotics and will delay her treatment for another 2 weeks

## 2023-10-18 NOTE — Assessment & Plan Note (Addendum)
 She has signs of oral bacterial infection I recommend canceling treatment today Will put her on a course of amoxicillin for a week If it is not improved by Monday next week, she will call us  and also schedule an appointment to see her dentist

## 2023-10-18 NOTE — Assessment & Plan Note (Addendum)
 She denies worsening peripheral neuropathy Continue close surveillance

## 2023-10-22 ENCOUNTER — Encounter: Payer: Self-pay | Admitting: Hematology and Oncology

## 2023-10-22 ENCOUNTER — Telehealth: Payer: Self-pay

## 2023-10-22 ENCOUNTER — Encounter: Payer: Self-pay | Admitting: Licensed Clinical Social Worker

## 2023-10-22 NOTE — Telephone Encounter (Signed)
 Returned her call. She is feeling better and the antibiotics are working. She will call the office back for questions/concerns.  Emailed last office note on 6/12 per her request, verified email. She needs office note for cancer society to help with her car.

## 2023-10-22 NOTE — Progress Notes (Signed)
 [facility] Clinical Social Work  Estate manager/land agent and Medicaid special assistance letter to this CSW with request to send to [place] with [employer]  Documents sent on pt's behalf.   Dylana Shaw E Raea Magallon, LCSW

## 2023-10-23 ENCOUNTER — Encounter: Payer: Self-pay | Admitting: Licensed Clinical Social Worker

## 2023-10-23 DIAGNOSIS — Z8619 Personal history of other infectious and parasitic diseases: Secondary | ICD-10-CM | POA: Diagnosis not present

## 2023-10-23 DIAGNOSIS — K219 Gastro-esophageal reflux disease without esophagitis: Secondary | ICD-10-CM | POA: Diagnosis not present

## 2023-10-23 DIAGNOSIS — R7303 Prediabetes: Secondary | ICD-10-CM | POA: Diagnosis not present

## 2023-10-23 DIAGNOSIS — J4520 Mild intermittent asthma, uncomplicated: Secondary | ICD-10-CM | POA: Diagnosis not present

## 2023-10-23 DIAGNOSIS — G35 Multiple sclerosis: Secondary | ICD-10-CM | POA: Diagnosis not present

## 2023-10-23 DIAGNOSIS — M069 Rheumatoid arthritis, unspecified: Secondary | ICD-10-CM | POA: Diagnosis not present

## 2023-10-23 DIAGNOSIS — G620 Drug-induced polyneuropathy: Secondary | ICD-10-CM | POA: Diagnosis not present

## 2023-10-23 DIAGNOSIS — E7800 Pure hypercholesterolemia, unspecified: Secondary | ICD-10-CM | POA: Diagnosis not present

## 2023-10-23 DIAGNOSIS — G894 Chronic pain syndrome: Secondary | ICD-10-CM | POA: Diagnosis not present

## 2023-10-23 DIAGNOSIS — I10 Essential (primary) hypertension: Secondary | ICD-10-CM | POA: Diagnosis not present

## 2023-10-23 NOTE — Progress Notes (Signed)
 [facility] Clinical Social Work  Brief letter written for pt confirming diagnosis and treatment for [employer] application. Letter sent to pt.   Sidhant Helderman E Sherlon Nied, LCSW

## 2023-10-26 ENCOUNTER — Ambulatory Visit

## 2023-10-26 ENCOUNTER — Other Ambulatory Visit

## 2023-10-26 ENCOUNTER — Ambulatory Visit: Admitting: Hematology and Oncology

## 2023-11-01 ENCOUNTER — Telehealth: Payer: Self-pay

## 2023-11-01 NOTE — Telephone Encounter (Signed)
 Called and reviewed appts. She is aware of appts tomorrow.

## 2023-11-02 ENCOUNTER — Other Ambulatory Visit: Payer: Self-pay | Admitting: Hematology and Oncology

## 2023-11-02 ENCOUNTER — Inpatient Hospital Stay

## 2023-11-02 ENCOUNTER — Encounter: Payer: Self-pay | Admitting: Hematology and Oncology

## 2023-11-02 ENCOUNTER — Inpatient Hospital Stay (HOSPITAL_BASED_OUTPATIENT_CLINIC_OR_DEPARTMENT_OTHER): Admitting: Hematology and Oncology

## 2023-11-02 VITALS — BP 176/96 | HR 77 | Temp 98.1°F | Resp 16

## 2023-11-02 VITALS — BP 157/88 | HR 82 | Temp 97.4°F | Resp 18 | Ht 68.0 in | Wt 170.0 lb

## 2023-11-02 DIAGNOSIS — Z5111 Encounter for antineoplastic chemotherapy: Secondary | ICD-10-CM | POA: Diagnosis not present

## 2023-11-02 DIAGNOSIS — C563 Malignant neoplasm of bilateral ovaries: Secondary | ICD-10-CM | POA: Diagnosis not present

## 2023-11-02 DIAGNOSIS — D701 Agranulocytosis secondary to cancer chemotherapy: Secondary | ICD-10-CM

## 2023-11-02 DIAGNOSIS — K122 Cellulitis and abscess of mouth: Secondary | ICD-10-CM

## 2023-11-02 DIAGNOSIS — Z95828 Presence of other vascular implants and grafts: Secondary | ICD-10-CM

## 2023-11-02 DIAGNOSIS — T451X5A Adverse effect of antineoplastic and immunosuppressive drugs, initial encounter: Secondary | ICD-10-CM

## 2023-11-02 DIAGNOSIS — G620 Drug-induced polyneuropathy: Secondary | ICD-10-CM | POA: Diagnosis not present

## 2023-11-02 LAB — CBC WITH DIFFERENTIAL (CANCER CENTER ONLY)
Abs Immature Granulocytes: 0 10*3/uL (ref 0.00–0.07)
Basophils Absolute: 0 10*3/uL (ref 0.0–0.1)
Basophils Relative: 1 %
Eosinophils Absolute: 0.1 10*3/uL (ref 0.0–0.5)
Eosinophils Relative: 4 %
HCT: 38.5 % (ref 36.0–46.0)
Hemoglobin: 13.4 g/dL (ref 12.0–15.0)
Immature Granulocytes: 0 %
Lymphocytes Relative: 34 %
Lymphs Abs: 1.3 10*3/uL (ref 0.7–4.0)
MCH: 35.1 pg — ABNORMAL HIGH (ref 26.0–34.0)
MCHC: 34.8 g/dL (ref 30.0–36.0)
MCV: 100.8 fL — ABNORMAL HIGH (ref 80.0–100.0)
Monocytes Absolute: 0.7 10*3/uL (ref 0.1–1.0)
Monocytes Relative: 18 %
Neutro Abs: 1.6 10*3/uL — ABNORMAL LOW (ref 1.7–7.7)
Neutrophils Relative %: 43 %
Platelet Count: 205 10*3/uL (ref 150–400)
RBC: 3.82 MIL/uL — ABNORMAL LOW (ref 3.87–5.11)
RDW: 12.8 % (ref 11.5–15.5)
WBC Count: 3.7 10*3/uL — ABNORMAL LOW (ref 4.0–10.5)
nRBC: 0 % (ref 0.0–0.2)

## 2023-11-02 LAB — CMP (CANCER CENTER ONLY)
ALT: 16 U/L (ref 0–44)
AST: 17 U/L (ref 15–41)
Albumin: 4.2 g/dL (ref 3.5–5.0)
Alkaline Phosphatase: 72 U/L (ref 38–126)
Anion gap: 6 (ref 5–15)
BUN: 21 mg/dL (ref 8–23)
CO2: 29 mmol/L (ref 22–32)
Calcium: 9.5 mg/dL (ref 8.9–10.3)
Chloride: 106 mmol/L (ref 98–111)
Creatinine: 0.9 mg/dL (ref 0.44–1.00)
GFR, Estimated: >60 mL/min (ref >60)
Glucose, Bld: 138 mg/dL — ABNORMAL HIGH (ref 70–99)
Potassium: 3.8 mmol/L (ref 3.5–5.1)
Sodium: 141 mmol/L (ref 135–145)
Total Bilirubin: 0.5 mg/dL (ref 0.0–1.2)
Total Protein: 7.2 g/dL (ref 6.5–8.1)

## 2023-11-02 MED ORDER — OXYCODONE HCL 5 MG PO TABS
20.0000 mg | ORAL_TABLET | Freq: Once | ORAL | Status: AC
  Administered 2023-11-02: 20 mg via ORAL
  Filled 2023-11-02: qty 4

## 2023-11-02 MED ORDER — SODIUM CHLORIDE 0.9% FLUSH
10.0000 mL | INTRAVENOUS | Status: DC | PRN
  Administered 2023-11-02: 10 mL

## 2023-11-02 MED ORDER — SODIUM CHLORIDE 0.9 % IV SOLN
80.0000 mg/m2 | Freq: Once | INTRAVENOUS | Status: AC
  Administered 2023-11-02: 156 mg via INTRAVENOUS
  Filled 2023-11-02: qty 26

## 2023-11-02 MED ORDER — SODIUM CHLORIDE 0.9% FLUSH
10.0000 mL | Freq: Once | INTRAVENOUS | Status: AC
Start: 2023-11-02 — End: 2023-11-02
  Administered 2023-11-02: 10 mL

## 2023-11-02 MED ORDER — HEPARIN SOD (PORK) LOCK FLUSH 100 UNIT/ML IV SOLN
500.0000 [IU] | Freq: Once | INTRAVENOUS | Status: AC | PRN
  Administered 2023-11-02: 500 [IU]

## 2023-11-02 MED ORDER — DEXAMETHASONE SODIUM PHOSPHATE 10 MG/ML IJ SOLN
10.0000 mg | Freq: Once | INTRAMUSCULAR | Status: AC
  Administered 2023-11-02: 10 mg via INTRAVENOUS

## 2023-11-02 MED ORDER — SODIUM CHLORIDE 0.9 % IV SOLN: INTRAVENOUS | Status: DC

## 2023-11-02 MED ORDER — FAMOTIDINE IN NACL 20-0.9 MG/50ML-% IV SOLN
20.0000 mg | Freq: Once | INTRAVENOUS | Status: AC
  Administered 2023-11-02: 20 mg via INTRAVENOUS
  Filled 2023-11-02: qty 50

## 2023-11-02 MED ORDER — AMOXICILLIN 500 MG PO TABS: ORAL_TABLET | ORAL | 0 refills | Status: DC

## 2023-11-02 NOTE — Assessment & Plan Note (Addendum)
 She denies worsening peripheral neuropathy Continue close surveillance

## 2023-11-02 NOTE — Assessment & Plan Note (Addendum)
 Per request from her dentist, I will prescribe oral antibiotics for her to take before her dental procedure

## 2023-11-02 NOTE — Progress Notes (Signed)
 [name] OFFICE PROGRESS NOTE  Patient Care Team: Arloa Elsie SAUNDERS, MD as PCP - General (Family Medicine) [name] (Physician Assistant)  Assessment & Plan Bilateral primary ovarian cancer [facility]) She has recurrent ovarian cancer, platinum sensitive High grade serous, p53 mutated, Neg genetics, HRD not detected, MSI stable, low TMB 2 She has progressed on carboplatin , gemcitabine  and bevacizumab   CT imaging from April showed worsening disease and her treatment is switched to paclitaxel  She tolerated paclitaxel  well without worsening neuropathy but she complicated by severe pancytopenia Treatment was recently modified to 2 weeks on, 1 week off and then another week break due to recent dental infection She will need deep cleaning in the near future We will proceed with treatment today Her pancytopenia has improved Next week, due to patient preference to be out of town, I will proceed to modify her treatment to delay her next treatment by 1 week  Leukopenia due to antineoplastic chemotherapy [facility]) This has improved Continue active surveillance Oral infection Per request from her dentist, I will prescribe oral antibiotics for her to take before her dental procedure Peripheral neuropathy due to chemotherapy [facility]) She denies worsening peripheral neuropathy Continue close surveillance  No orders of the defined types were placed in this encounter.    [name], MD  INTERVAL HISTORY: she returns for treatment follow-up Complications related to previous cycle of chemotherapy included pancytopenia,, peripheral neuropathy,, fatigue,, and infection, Her dental infection has improved Her dentist recommended deep cleaning in the near future Her neuropathy is stable Her energy level has improved since we take a break from treatment  PHYSICAL EXAMINATION: ECOG PERFORMANCE STATUS: 1 - Symptomatic but completely ambulatory  Lab Results  Component Value Date    CAN125 29.8 09/27/2023   CAN125 16.1 08/17/2023   CAN125 33.2 07/12/2023      Latest Ref Rng & Units 11/02/2023   12:10 PM 10/18/2023   12:12 PM 10/04/2023    8:32 AM  CBC  WBC 4.0 - 10.5 K/uL 3.7  3.0  3.1   Hemoglobin 12.0 - 15.0 g/dL 86.5  88.4  87.9   Hematocrit 36.0 - 46.0 % 38.5  33.6  35.1   Platelets 150 - 400 K/uL 205  241  188       Chemistry      Component Value Date/Time   NA 136 10/18/2023 1212   K 3.8 10/18/2023 1212   CL 101 10/18/2023 1212   CO2 25 10/18/2023 1212   BUN 15 10/18/2023 1212   CREATININE 0.98 10/18/2023 1212      Component Value Date/Time   CALCIUM 8.8 (L) 10/18/2023 1212   ALKPHOS 80 10/18/2023 1212   AST 25 10/18/2023 1212   ALT 23 10/18/2023 1212   BILITOT 0.7 10/18/2023 1212       Vitals:   11/02/23 1229  BP: (!) 157/88  Pulse: 82  Resp: 18  Temp: (!) 97.4 F (36.3 C)  SpO2: 100%   Filed Weights   11/02/23 1229  Weight: 170 lb (77.1 kg)   Other relevant data reviewed during this visit included CBC and CMP

## 2023-11-02 NOTE — Assessment & Plan Note (Addendum)
 This has improved Continue active surveillance

## 2023-11-02 NOTE — Patient Instructions (Signed)
 [facility] - A DEPT OF [facility]  Discharge Instructions: Thank you for choosing [facility] to provide your oncology and hematology care.   If you have a lab appointment with the Cancer Center, please go directly to the Cancer Center and check in at the registration area.   Wear comfortable clothing and clothing appropriate for easy access to any Portacath or PICC line.   We strive to give you quality time with your provider. You may need to reschedule your appointment if you arrive late (15 or more minutes).  Arriving late affects you and other patients whose appointments are after yours.  Also, if you miss three or more appointments without notifying the office, you may be dismissed from the clinic at the provider's discretion.      For prescription refill requests, have your pharmacy contact our office and allow 72 hours for refills to be completed.    Today you received the following chemotherapy and/or immunotherapy agent: Paclitaxel (Taxol).      To help prevent nausea and vomiting after your treatment, we encourage you to take your nausea medication as directed.  BELOW ARE SYMPTOMS THAT SHOULD BE REPORTED IMMEDIATELY: *FEVER GREATER THAN 100.4 F (38 C) OR HIGHER *CHILLS OR SWEATING *NAUSEA AND VOMITING THAT IS NOT CONTROLLED WITH YOUR NAUSEA MEDICATION *UNUSUAL SHORTNESS OF BREATH *UNUSUAL BRUISING OR BLEEDING *URINARY PROBLEMS (pain or burning when urinating, or frequent urination) *BOWEL PROBLEMS (unusual diarrhea, constipation, pain near the anus) TENDERNESS IN MOUTH AND THROAT WITH OR WITHOUT PRESENCE OF ULCERS (sore throat, sores in mouth, or a toothache) UNUSUAL RASH, SWELLING OR PAIN  UNUSUAL VAGINAL DISCHARGE OR ITCHING   Items with * indicate a potential emergency and should be followed up as soon as possible or go to the Emergency Department if any problems should occur.  Please show the CHEMOTHERAPY ALERT CARD or  IMMUNOTHERAPY ALERT CARD at check-in to the Emergency Department and triage nurse.  Should you have questions after your visit or need to cancel or reschedule your appointment, please contact [facility] - A DEPT OF [facility]  Dept: [phone]  and follow the prompts.  Office hours are 8:00 a.m. to 4:30 p.m. Monday - Friday. Please note that voicemails left after 4:00 p.m. may not be returned until the following business day.  We are closed weekends and major holidays. You have access to a nurse at all times for urgent questions. Please call the main number to the clinic Dept: [phone] and follow the prompts.   For any non-urgent questions, you may also contact your provider using MyChart. We now offer e-Visits for anyone 108 and older to request care online for non-urgent symptoms. For details visit [email or web address].   Also download the MyChart app! Go to the app store, search "MyChart", open the app, select [facility], and log in with your MyChart username and password.

## 2023-11-02 NOTE — Assessment & Plan Note (Addendum)
 She has recurrent ovarian cancer, platinum sensitive High grade serous, p53 mutated, Neg genetics, HRD not detected, MSI stable, low TMB 2 She has progressed on carboplatin , gemcitabine  and bevacizumab   CT imaging from April showed worsening disease and her treatment is switched to paclitaxel  She tolerated paclitaxel  well without worsening neuropathy but she complicated by severe pancytopenia Treatment was recently modified to 2 weeks on, 1 week off and then another week break due to recent dental infection She will need deep cleaning in the near future We will proceed with treatment today Her pancytopenia has improved Next week, due to patient preference to be out of town, I will proceed to modify her treatment to delay her next treatment by 1 week

## 2023-11-03 LAB — CA 125: Cancer Antigen (CA) 125: 40 U/mL — ABNORMAL HIGH (ref 0.0–38.1)

## 2023-11-07 ENCOUNTER — Telehealth: Payer: Self-pay

## 2023-11-07 NOTE — Telephone Encounter (Signed)
 Received VM from pt expressing she has mouth pain and is asking for refill on magic mouthwash previously ordered 09/27/23. She is requesting refill to be sent to [facility] on [address], although most retail pharmacies do not fill compounds.  Pt also states in VM that she is experiencing burning/tingling and stabbing pains in her back and is asking if this could be neuropathy. Attempted to call pt to discuss concerns. LVM for call back. Pt had visit with MD 6/27 and next f/u is 7/10. This message forwarded to MD.

## 2023-11-08 ENCOUNTER — Other Ambulatory Visit: Payer: Self-pay

## 2023-11-08 ENCOUNTER — Encounter: Payer: Self-pay | Admitting: Hematology and Oncology

## 2023-11-08 ENCOUNTER — Ambulatory Visit: Admitting: Hematology and Oncology

## 2023-11-08 ENCOUNTER — Ambulatory Visit

## 2023-11-08 ENCOUNTER — Other Ambulatory Visit

## 2023-11-08 MED ORDER — MAGIC MOUTHWASH W/LIDOCAINE: 5.0000 mL | Freq: Four times a day (QID) | ORAL | 0 refills | Status: DC | PRN

## 2023-11-08 NOTE — Telephone Encounter (Signed)
 Pt insists her phx, [facility] on [address] has filled this before for her. I called them and s/w pharmacist who confirmed he is able to compound your recipe. This was called in per your verbal order. Pt is aware and was given instructions to use baking soda and salt water  until they can get her magic mouthwash filled. She was also advised her back pain/burning/tingling is neuropathy and this would be discussed at her next visit. She has no questions at this time.

## 2023-11-08 NOTE — Telephone Encounter (Signed)
 Her symptoms are due to neuropathy; we can discuss dose reduction in her next visit Tell her local pharmacy will not compound magic mouth wash, if she declines go to [facility], then tell her to use baking soda/salt water  gargle If she agrees with [facility], then call in MMW with lidocaine  swish and spit 4 x per day with no refills

## 2023-11-14 NOTE — Progress Notes (Signed)
 [facility] exam Note  [name] is a 68 y.o. female identified by name who presents to the [facility] for a limited exam.   SUBJECTIVE Patient denies changes to medical history or medication list. Patient denies recent history of pain, swelling, bleeding, infection.  [Past Medical History]  [Past Medical History] Diagnosis Date  . Age related osteoporosis   . Anxiety    history of panic attacks and claustrophobia  . Arthritis   . Asthma (HHS-HCC)    mild-rare use of inhaler; when she was younger was a problem  . Bronchitis   . Chronic low back pain   . DDD (degenerative disc disease), lumbar   . 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 History of hepatitis C 03/2015  . Hypertension   . Kidney stone on right side   . Multiple sclerosis      Diagnosed in 2021  . Ovarian cancer      Was diagnosed stage IV in December 2023  . PONV (postoperative nausea and vomiting)    nausea  . Rheumatoid arthritis   08/31/2012  . Spinal stenosis of lumbar region     [Past Surgical History]  [Past Surgical History] Procedure Laterality Date  . DIAGNOSTIC LAPAROSCOPY     for endometriosis  . HYSTERECTOMY    . LIVER BIOPSY     Was completed about 12 years prior for hepatitis C diagnosis. Patient states she is now cleared.  . LUMBAR DISC SURGERY  1999   L4L5 herniated disc  . PR ALLOGRAFT FOR SPINE SURGERY ONLY MORSELIZED N/A 12/25/2016   Procedure: ALLOGRAFT FOR SPINE SURGERY ONLY; MORSELIZED;  Surgeon: Royden Elsie Schneider, MD;  Location: OR [facility];  Service: Orthopedics  . PR ARTHRODESIS ANT INTERBODY MIN DISCECTOMY,EA ADDL N/A 12/25/2016   Procedure: ARTHRODESIS, ANTERIOR INTERBODY TECHNIQUE, W/MINIMAL DISKECTOMY TO PREP INTERSPACE; EACH ADD`L INTERSPACE;  Surgeon: Royden Elsie Schneider, MD;  Location: OR [facility];  Service: Orthopedics  . PR ARTHRODESIS ANT INTERBODY MIN DISCECTOMY,LUMBAR N/A 12/25/2016   Procedure:  ANTERIOR/POSTERIOR L4-S1, INSTRUMENTED FUSION WITH ALLOGRAFT;  Surgeon: Royden Elsie Schneider, MD;  Location: OR [facility];  Service: Orthopedics  . PR ARTHRODESIS POSTERIOR/PSTLAT TQ 1NTRSPC LUMBAR N/A 12/25/2016   Procedure: ARTHRODESIS, POSTERIOR OR POSTEROLATERAL TECH, SINGLE LEVEL; LUMBAR(LATERAL TRANSVERSE TECHNIQUE IF DONE);  Surgeon: Royden Elsie Schneider, MD;  Location: OR [facility];  Service: Orthopedics  . PR ARTHRODESIS PST/PSTLAT TQ 1NTRSPC EA ADDL NTRSPC N/A 12/25/2016   Procedure: ARTHRODESIS, POSTERIOR OR POSTEROLATERAL TECHNIQUE, SINGLE LEVEL; EACH ADDITIONAL VERTEBRAL SEGMENT;  Surgeon: Royden Elsie Schneider, MD;  Location: OR [facility];  Service: Orthopedics  . PR AUTOGRAFT SPINE SURGERY LOCAL FROM SAME INCISION N/A 12/25/2016   Procedure: AUTOGRAFT/SPINE SURG ONLY (W/HARVEST GRAFT); LOCAL (EG, RIB/SPINOUS PROC, LAM FRGMT) OBTAIN FROM SAME INCIS;  Surgeon: Royden Elsie Schneider, MD;  Location: OR [facility];  Service: Orthopedics  . PR BONE MARROW ASPIRATION BONE GRFG SPI SURG ONLY N/A 12/25/2016   Procedure: BONE MARROW ASPIRATION FOR BONE GRAFTING, SPINE SURGERY ONLY, THROUGH SEPARATE SKIN OR FASCIAL INCISION (LIST SEPARATELY IN ADDITION TO CODE FOR PRIMARY PROCEDURE);  Surgeon: Royden Elsie Schneider, MD;  Location: OR [facility];  Service: Orthopedics  . PR INSJ BIOMCHN DEV INTERVERTEBRAL DSC SPC W/ARTHRD N/A 12/25/2016   Procedure: INSERT INTERBODY BIOMECHANICAL DEVICE(S) WITH INTEGRAL ANTERIOR INSTRUMENT FOR DEVICE ANCHORING, WHEN PERFORMED, TO INTERVERTEBRAL DISC SPACE IN CONJUNCTION WITH INTERBODY ARTHRODESIS, EACH INTERSPACE;  Surgeon: Royden Elsie Schneider, MD;  Location: OR [facility];  Service:  Orthopedics  . PR POSTERIOR SEGMENTAL INSTRUMENTATION 3-6 VRT SEG N/A 12/25/2016   Procedure: POSTERIOR SEGMENT INSTRUMENT (EG, PEDICLE FIX, DUAL ROD W/MULT HOOK/SUBLAMINAL WIRE); 3 TO 6 VERTEB SEGMENT;  Surgeon: Royden Elsie Schneider, MD;  Location: OR [facility];  Service: Orthopedics  . PR STEREOTACTIC COMP ASSIST PROC,SPINAL N/A 12/25/2016    Procedure: STEREOTACTIC COMPUTER-ASSISTED (NAVIGATIONAL) PROCEDURE; SPINE;  Surgeon: Royden Elsie Schneider, MD;  Location: OR [facility];  Service: Orthopedics  . ROTATOR CUFF REPAIR Right 2013   [Allergies] Allergen Reactions  . Adhesive Swelling and Rash    Local skin swelling at site Paper take OK  . Celexa [Citalopram] Swelling  . Duloxetine Shortness Of Breath, Swelling and Other (See Comments)    Tongue and leg swelling  . Fluticasone Furoate-Vilanterol Swelling  . Opana [Oxymorphone] Swelling    tongue  . Cyclobenzaprine Hcl Other (See Comments)    Extreme lethargy  . Gabapentin Nausea Only    lethargy    [Current Medications]  [Current Medications]  Current Outpatient Medications:  .  albuterol  (PROVENTIL  HFA;VENTOLIN  HFA) 90 mcg/actuation inhaler, Inhale 2 puffs every six (6) hours as needed for wheezing., Disp: , Rfl:  .  ALPRAZolam (XANAX) 1 MG tablet, Take 1 mg by mouth nightly as needed for sleep. (Patient not taking: Reported on 05/16/2023), Disp: , Rfl:  .  baclofen (LIORESAL) 10 MG tablet, Take 1 tablet (10 mg total) by mouth Three (3) times a day., Disp: , Rfl:  .  dextroamphetamine -amphetamine  (ADDERALL  XR) 20 MG 24 hr capsule, Take 1 capsule (20 mg total) by mouth two (2) times a day., Disp: , Rfl:  .  fluticasone propion-salmeterol (WIXELA INHUB) 250-50 mcg/dose diskus, Inhale 1 puff two (2) times a day., Disp: , Rfl:  .  fluticasone-salmeterol (ADVAIR HFA) 230-21 mcg/actuation inhaler, Inhale 2 puffs Two (2) times a day. (Patient not taking: Reported on 05/16/2023), Disp: , Rfl:  .  irbesartan  (AVAPRO ) 300 MG tablet, Take 1 tablet (300 mg total) by mouth daily., Disp: , Rfl:  .  lisinopril (PRINIVIL,ZESTRIL) 30 MG tablet, Take 30 mg by mouth daily. (Patient not taking: Reported on 05/16/2023), Disp: , Rfl:  .  LORazepam  (ATIVAN ) 2 MG tablet, Take 10 tablets (20 mg total) by mouth in the morning., Disp: , Rfl:  .  methocarbamol (ROBAXIN) 500 MG tablet, Take 1 tablet (500 mg  total) by mouth 4 (four) times a day as needed (muscle spasms). (Patient not taking: Reported on 05/16/2023), Disp: 120 tablet, Rfl: 0 .  morphine  sulfate (MORPHINE  ORAL), Take 15 mg by mouth continuous as needed. (Patient not taking: Reported on 05/16/2023), Disp: , Rfl:  .  olopatadine (PATANOL) 0.1 % ophthalmic solution, INSTILL 1 DROP INTO OU DAILY-PRN (Patient not taking: Reported on 05/16/2023), Disp: , Rfl:  .  omeprazole (PRILOSEC) 20 MG capsule, Take 1 capsule (20 mg total) by mouth daily. (Patient not taking: Reported on 07/02/2023), Disp: , Rfl:  .  ondansetron  (ZOFRAN ) 8 MG tablet, Take 1 tablet (8 mg total) by mouth., Disp: , Rfl:  .  oxyCODONE  (ROXICODONE ) 15 MG immediate release tablet, Take 20 mg by mouth two (2) times a day., Disp: , Rfl:  .  oxyCODONE  (XTAMPZA  ER) 13.5 mg CSpT 12 hr ER capsule sprinkle, Take 1 capsule (13.5 mg total) by mouth every twelve (12) hours., Disp: , Rfl:  .  prochlorperazine  (COMPAZINE ) 10 MG tablet, Take 1 tablet (10 mg total) by mouth., Disp: , Rfl:  .  varenicline (CHANTIX) 1 mg tablet, Take 1 tablet (1  mg total) by mouth daily., Disp: , Rfl:  Magic Mouthwash  Social History   Tobacco Use  . Smoking status: Some Days    Types: Cigarettes  . Smokeless tobacco: Never  . Tobacco comments:    Currently smokes 1 pack per week.  Substance Use Topics  . Alcohol use: Yes    Comment: glass of wine rarely    [Family History] Problem Relation Age of Onset  . Hypertension Mother   . Arthritis Mother   . Pancreatitis Father   . Anxiety disorder Sister   . Arthritis Sister   . Anxiety disorder Sister   . Arthritis Sister   . Anxiety disorder Sister   . Arthritis Sister    OBJECTIVE Review of Systems: ROS Vitals:   11/14/23 1407  BP: 136/87  BP Site: R Arm  BP Position: Sitting  Pulse: 63   Chief Complaint:  Patient presents to office due to pain in lower jaw. Pain is more severe in lower right and mandibular anterior regions. Lump in back right  and bottom right front. Patient reports a Lump comes and goes.  One week rx antibiotic from oncologist 1 month ago. Amoxicillin  500 mg. Pain in tooth came back and feels pain in tongue - get swollen and ridgy. Nighttime after brushing and flossing, goes to sleep then tooth starts hurting. Deep and feels like its in the root, dull. 10/10 pain.  Two front teeth are also causing pain. Describes pain as sharp and gum soreness.   Prescribed 2 g of amoxicillin  prior to probing and disturbing periodontium. Per oncologist, recommends prophylactic antibiotic prior to prophylaxis. 2:16 administered   Chief Complaint: Patient presents with pain localized to the lower jaw, primarily affecting the lower right and mandibular anterior regions. History of Present Illness: The patient reports intermittent swelling and a lump in the posterior right and anterior right mandibular regions. These lumps tend to appear and resolve spontaneously. Approximately one month ago, the patient was prescribed a 1-week course of amoxicillin  500 mg by their oncologist due to swelling in lower right and swelling of face, per [name]. Although symptoms initially improved, the pain has since returned. The patient describes the current pain as deep, dull, and originating from the root of the affected tooth, particularly severe at night following brushing and flossing. The discomfort wakes her from sleep and is rated as 10/10 in intensity. The patient also reports pain in the tongue, which feels swollen and has a ridgy texture during flare-ups. Additionally, the mandibular central incisors are causing sharp pain with associated gingival tenderness.  Medical Management: Per oncologist's recommendation, the patient was premedicated with 2 grams of amoxicillin  prior to any periodontal probing or manipulation of the periodontium, to minimize infection risk. Medications Administered: Amoxicillin  2 g, administered at 2:16 PM prior to  examination.  Examination: Intraoral Examination: Buccal-to-lingual swelling noted in the region of teeth #26 and #27. Clinical photos obtained for documentation. Inflammatory papillary hyperplasia observed in the edentulous area corresponding to missing teeth #24 and #25. Generalized tenderness to palpation and percussion throughout the mandibular dentition. Tooth mobility: Class II mobility noted for teeth #23 and #26. Patient wears an ill-fitting mandibular RPD and reports that it places excessive pressure on the anterior mandibular teeth. Radiographic and Diagnostic Findings: Periapical radiographs obtained for teeth #23, #26, and #31. Asymmetric widening of the periodontal ligament (PDL) space noted on #26, with progression compared to [facility] from February 2025. Widening of the PDL also observed on #23. Slight periapical radiolucency  noted at the apex of #31. Endodontic testing performed; all tested teeth are vital, and there is no current indication for root canal therapy. Endodontic testing shown below:   Cold/ EPT Percussion Palpation Mobility Swelling  #8 Normal response Negative Negative - -  #26 Positive, normal response Positive Positive Class II +, suppuration  #31 Positive, normal response Positive Positive - -  #23 Positive, normal response Positive Positive Class II -   Probing depths around #26:  Mesiobuccal: 3 mm Midbuccal: 3 mm Distobuccal: 4 mm Distolingual: 4 mm Midlingual: 6 mm Mesiolingual: 3 mm  Probing depths around #31::  Mesiobuccal: 3 mm Midbuccal: 3 mm Distobuccal: 6 mm Distolingual: 4 mm Midlingual: 4 mm Mesiolingual: 4 mm  Endodontic consult: Dr. Sherry   Assessment and Plan: Swelling, mobility, and radiographic findings at #26 are concerning for possible localized pathology not of endodontic origin. Due to persistent symptoms, progressive PDL widening, and swelling, recommend extraction of tooth #26 and biopsy of associated soft  tissue. Continue to monitor radiolucency at #31 and PDL widening at #23. Presented patient with dental clearance form to bring to oncologist. Patient reports having appointment tomorrow. Requested patient to deliver form to oncologist and return to our office for antibiotic prophylaxis recommendation for extraction and biopsy.   Provider: Therisa Morrow, DDS Candidate [phone] Attending: Dr. Charlanne  Procedure completed today: Limited Exam Next Visit: Extraction #26 and biopsy of surround tissue/ bone; SRP

## 2023-11-15 ENCOUNTER — Encounter: Payer: Self-pay | Admitting: Hematology and Oncology

## 2023-11-15 ENCOUNTER — Inpatient Hospital Stay: Admitting: Hematology and Oncology

## 2023-11-15 ENCOUNTER — Inpatient Hospital Stay

## 2023-11-15 ENCOUNTER — Inpatient Hospital Stay: Attending: Gynecologic Oncology

## 2023-11-15 VITALS — BP 124/85 | HR 71 | Temp 98.6°F | Resp 18 | Ht 68.0 in | Wt 177.0 lb

## 2023-11-15 DIAGNOSIS — G620 Drug-induced polyneuropathy: Secondary | ICD-10-CM | POA: Diagnosis not present

## 2023-11-15 DIAGNOSIS — C563 Malignant neoplasm of bilateral ovaries: Secondary | ICD-10-CM | POA: Insufficient documentation

## 2023-11-15 DIAGNOSIS — Z5111 Encounter for antineoplastic chemotherapy: Secondary | ICD-10-CM | POA: Insufficient documentation

## 2023-11-15 DIAGNOSIS — T451X5A Adverse effect of antineoplastic and immunosuppressive drugs, initial encounter: Secondary | ICD-10-CM | POA: Insufficient documentation

## 2023-11-15 DIAGNOSIS — D61818 Other pancytopenia: Secondary | ICD-10-CM

## 2023-11-15 DIAGNOSIS — D701 Agranulocytosis secondary to cancer chemotherapy: Secondary | ICD-10-CM | POA: Insufficient documentation

## 2023-11-15 DIAGNOSIS — K122 Cellulitis and abscess of mouth: Secondary | ICD-10-CM

## 2023-11-15 DIAGNOSIS — Z95828 Presence of other vascular implants and grafts: Secondary | ICD-10-CM

## 2023-11-15 LAB — CBC WITH DIFFERENTIAL (CANCER CENTER ONLY)
Abs Immature Granulocytes: 0.01 K/uL (ref 0.00–0.07)
Basophils Absolute: 0.1 K/uL (ref 0.0–0.1)
Basophils Relative: 1 %
Eosinophils Absolute: 0.2 K/uL (ref 0.0–0.5)
Eosinophils Relative: 5 %
HCT: 34.4 % — ABNORMAL LOW (ref 36.0–46.0)
Hemoglobin: 11.7 g/dL — ABNORMAL LOW (ref 12.0–15.0)
Immature Granulocytes: 0 %
Lymphocytes Relative: 46 %
Lymphs Abs: 1.8 K/uL (ref 0.7–4.0)
MCH: 34.5 pg — ABNORMAL HIGH (ref 26.0–34.0)
MCHC: 34 g/dL (ref 30.0–36.0)
MCV: 101.5 fL — ABNORMAL HIGH (ref 80.0–100.0)
Monocytes Absolute: 0.7 K/uL (ref 0.1–1.0)
Monocytes Relative: 18 %
Neutro Abs: 1.2 K/uL — ABNORMAL LOW (ref 1.7–7.7)
Neutrophils Relative %: 30 %
Platelet Count: 222 K/uL (ref 150–400)
RBC: 3.39 MIL/uL — ABNORMAL LOW (ref 3.87–5.11)
RDW: 12.1 % (ref 11.5–15.5)
WBC Count: 4 K/uL (ref 4.0–10.5)
nRBC: 0 % (ref 0.0–0.2)

## 2023-11-15 LAB — CMP (CANCER CENTER ONLY)
ALT: 18 U/L (ref 0–44)
AST: 18 U/L (ref 15–41)
Albumin: 3.9 g/dL (ref 3.5–5.0)
Alkaline Phosphatase: 82 U/L (ref 38–126)
Anion gap: 4 — ABNORMAL LOW (ref 5–15)
BUN: 21 mg/dL (ref 8–23)
CO2: 32 mmol/L (ref 22–32)
Calcium: 9.3 mg/dL (ref 8.9–10.3)
Chloride: 105 mmol/L (ref 98–111)
Creatinine: 0.96 mg/dL (ref 0.44–1.00)
GFR, Estimated: >60 mL/min (ref >60)
Glucose, Bld: 103 mg/dL — ABNORMAL HIGH (ref 70–99)
Potassium: 4.3 mmol/L (ref 3.5–5.1)
Sodium: 141 mmol/L (ref 135–145)
Total Bilirubin: 0.3 mg/dL (ref 0.0–1.2)
Total Protein: 6.8 g/dL (ref 6.5–8.1)

## 2023-11-15 MED ORDER — SODIUM CHLORIDE 0.9% FLUSH
10.0000 mL | INTRAVENOUS | Status: DC | PRN
  Administered 2023-11-15: 10 mL

## 2023-11-15 MED ORDER — SODIUM CHLORIDE 0.9 % IV SOLN
80.0000 mg/m2 | Freq: Once | INTRAVENOUS | Status: AC
  Administered 2023-11-15: 156 mg via INTRAVENOUS
  Filled 2023-11-15: qty 26

## 2023-11-15 MED ORDER — SODIUM CHLORIDE 0.9% FLUSH
10.0000 mL | Freq: Once | INTRAVENOUS | Status: AC
Start: 2023-11-15 — End: 2023-11-15
  Administered 2023-11-15: 10 mL

## 2023-11-15 MED ORDER — FAMOTIDINE IN NACL 20-0.9 MG/50ML-% IV SOLN
20.0000 mg | Freq: Once | INTRAVENOUS | Status: AC
  Administered 2023-11-15: 20 mg via INTRAVENOUS
  Filled 2023-11-15: qty 50

## 2023-11-15 MED ORDER — DEXAMETHASONE SODIUM PHOSPHATE 10 MG/ML IJ SOLN
10.0000 mg | Freq: Once | INTRAMUSCULAR | Status: AC
  Administered 2023-11-15: 10 mg via INTRAVENOUS
  Filled 2023-11-15: qty 1

## 2023-11-15 MED ORDER — HEPARIN SOD (PORK) LOCK FLUSH 100 UNIT/ML IV SOLN
500.0000 [IU] | Freq: Once | INTRAVENOUS | Status: AC | PRN
  Administered 2023-11-15: 500 [IU]

## 2023-11-15 MED ORDER — SODIUM CHLORIDE 0.9 % IV SOLN: INTRAVENOUS | Status: DC

## 2023-11-15 NOTE — Assessment & Plan Note (Addendum)
 She denies worsening peripheral neuropathy Continue close surveillance

## 2023-11-15 NOTE — Progress Notes (Signed)
 [facility] OFFICE PROGRESS NOTE  Patient Care Team: Arloa Elsie SAUNDERS, MD as PCP - General (Family Medicine) [name] (Physician Assistant)  Assessment & Plan Bilateral primary ovarian cancer [facility]) She has recurrent ovarian cancer, platinum sensitive High grade serous, p53 mutated, Neg genetics, HRD not detected, MSI stable, low TMB 2 She has progressed on carboplatin , gemcitabine  and bevacizumab   CT imaging from April showed worsening disease and her treatment is switched to paclitaxel  She tolerated paclitaxel  well without worsening neuropathy but she complicated by severe pancytopenia Treatment was recently modified to 2 weeks on, 1 week off and then another week break due to recent dental infection She is mildly pancytopenic but is willing to proceed with treatment After today's treatment, I plan to order CT imaging for objective assessment of response to therapy Pancytopenia, acquired (HCC) Overall stable We will proceed with treatment with mildly reduced ANC Oral infection She was evaluated by her dentist recently We discussed plan of care If CT imaging next week shows stability of disease control, we will hold her treatment until she can complete all her dental work Peripheral neuropathy due to chemotherapy [facility]) She denies worsening peripheral neuropathy Continue close surveillance  Orders Placed This Encounter  Procedures   CT ABDOMEN PELVIS W CONTRAST    Standing Status:   Future    Expected Date:   11/22/2023    Expiration Date:   11/14/2024    Scheduling Instructions:     No need oral contrast    If indicated for the ordered procedure, I authorize the administration of contrast media per Radiology protocol:   Yes    Does the patient have a contrast media/X-ray dye allergy?:   No    Preferred imaging location?:   [facility]    If indicated for the ordered procedure, I authorize the administration of oral contrast media per  Radiology protocol:   No    Reason for no oral contrast::   No need oral contrast     [name], MD  INTERVAL HISTORY: she returns for treatment follow-up Complications related to previous cycle of chemotherapy included pancytopenia,, peripheral neuropathy,, and infection, She saw her dentist Apparently, further dental work needs to be done including possible dental extraction in the future She denies worsening peripheral neuropathy  PHYSICAL EXAMINATION: ECOG PERFORMANCE STATUS: 1 - Symptomatic but completely ambulatory  Lab Results  Component Value Date   CAN125 40.0 (H) 11/02/2023   CAN125 29.8 09/27/2023   CAN125 16.1 08/17/2023      Latest Ref Rng & Units 11/15/2023    1:21 PM 11/02/2023   12:10 PM 10/18/2023   12:12 PM  CBC  WBC 4.0 - 10.5 K/uL 4.0  3.7  3.0   Hemoglobin 12.0 - 15.0 g/dL 88.2  86.5  88.4   Hematocrit 36.0 - 46.0 % 34.4  38.5  33.6   Platelets 150 - 400 K/uL 222  205  241       Chemistry      Component Value Date/Time   NA 141 11/15/2023 1321   K 4.3 11/15/2023 1321   CL 105 11/15/2023 1321   CO2 32 11/15/2023 1321   BUN 21 11/15/2023 1321   CREATININE 0.96 11/15/2023 1321      Component Value Date/Time   CALCIUM 9.3 11/15/2023 1321   ALKPHOS 82 11/15/2023 1321   AST 18 11/15/2023 1321   ALT 18 11/15/2023 1321   BILITOT 0.3 11/15/2023 1321  Vitals:   11/15/23 1359  BP: 124/85  Pulse: 71  Resp: 18  Temp: 98.6 F (37 C)  SpO2: 100%   Filed Weights   11/15/23 1359  Weight: 177 lb (80.3 kg)   Other relevant data reviewed during this visit included CBC and CMP

## 2023-11-15 NOTE — Assessment & Plan Note (Addendum)
 She has recurrent ovarian cancer, platinum sensitive High grade serous, p53 mutated, Neg genetics, HRD not detected, MSI stable, low TMB 2 She has progressed on carboplatin , gemcitabine  and bevacizumab   CT imaging from April showed worsening disease and her treatment is switched to paclitaxel  She tolerated paclitaxel  well without worsening neuropathy but she complicated by severe pancytopenia Treatment was recently modified to 2 weeks on, 1 week off and then another week break due to recent dental infection She is mildly pancytopenic but is willing to proceed with treatment After today's treatment, I plan to order CT imaging for objective assessment of response to therapy

## 2023-11-15 NOTE — Patient Instructions (Signed)
 [facility] - A DEPT OF [facility]  Discharge Instructions: Thank you for choosing [facility] to provide your oncology and hematology care.   If you have a lab appointment with the Cancer Center, please go directly to the Cancer Center and check in at the registration area.   Wear comfortable clothing and clothing appropriate for easy access to any Portacath or PICC line.   We strive to give you quality time with your provider. You may need to reschedule your appointment if you arrive late (15 or more minutes).  Arriving late affects you and other patients whose appointments are after yours.  Also, if you miss three or more appointments without notifying the office, you may be dismissed from the clinic at the provider's discretion.      For prescription refill requests, have your pharmacy contact our office and allow 72 hours for refills to be completed.    Today you received the following chemotherapy and/or immunotherapy agent: Paclitaxel (Taxol).      To help prevent nausea and vomiting after your treatment, we encourage you to take your nausea medication as directed.  BELOW ARE SYMPTOMS THAT SHOULD BE REPORTED IMMEDIATELY: *FEVER GREATER THAN 100.4 F (38 C) OR HIGHER *CHILLS OR SWEATING *NAUSEA AND VOMITING THAT IS NOT CONTROLLED WITH YOUR NAUSEA MEDICATION *UNUSUAL SHORTNESS OF BREATH *UNUSUAL BRUISING OR BLEEDING *URINARY PROBLEMS (pain or burning when urinating, or frequent urination) *BOWEL PROBLEMS (unusual diarrhea, constipation, pain near the anus) TENDERNESS IN MOUTH AND THROAT WITH OR WITHOUT PRESENCE OF ULCERS (sore throat, sores in mouth, or a toothache) UNUSUAL RASH, SWELLING OR PAIN  UNUSUAL VAGINAL DISCHARGE OR ITCHING   Items with * indicate a potential emergency and should be followed up as soon as possible or go to the Emergency Department if any problems should occur.  Please show the CHEMOTHERAPY ALERT CARD or  IMMUNOTHERAPY ALERT CARD at check-in to the Emergency Department and triage nurse.  Should you have questions after your visit or need to cancel or reschedule your appointment, please contact [facility] - A DEPT OF [facility]  Dept: [phone]  and follow the prompts.  Office hours are 8:00 a.m. to 4:30 p.m. Monday - Friday. Please note that voicemails left after 4:00 p.m. may not be returned until the following business day.  We are closed weekends and major holidays. You have access to a nurse at all times for urgent questions. Please call the main number to the clinic Dept: [phone] and follow the prompts.   For any non-urgent questions, you may also contact your provider using MyChart. We now offer e-Visits for anyone 108 and older to request care online for non-urgent symptoms. For details visit [email or web address].   Also download the MyChart app! Go to the app store, search "MyChart", open the app, select [facility], and log in with your MyChart username and password.

## 2023-11-15 NOTE — Assessment & Plan Note (Addendum)
 Overall stable We will proceed with treatment with mildly reduced ANC

## 2023-11-15 NOTE — Assessment & Plan Note (Addendum)
 She was evaluated by her dentist recently We discussed plan of care If CT imaging next week shows stability of disease control, we will hold her treatment until she can complete all her dental work

## 2023-11-16 LAB — CA 125: Cancer Antigen (CA) 125: 32.3 U/mL (ref 0.0–38.1)

## 2023-11-22 ENCOUNTER — Ambulatory Visit (HOSPITAL_COMMUNITY)
Admission: RE | Admit: 2023-11-22 | Discharge: 2023-11-22 | Disposition: A | Discharge status: Home / Self Care | Source: Ambulatory Visit | Attending: Hematology and Oncology | Admitting: Hematology and Oncology

## 2023-11-22 DIAGNOSIS — C772 Secondary and unspecified malignant neoplasm of intra-abdominal lymph nodes: Secondary | ICD-10-CM | POA: Diagnosis not present

## 2023-11-22 DIAGNOSIS — C7889 Secondary malignant neoplasm of other digestive organs: Secondary | ICD-10-CM | POA: Diagnosis not present

## 2023-11-22 DIAGNOSIS — C563 Malignant neoplasm of bilateral ovaries: Secondary | ICD-10-CM | POA: Insufficient documentation

## 2023-11-22 DIAGNOSIS — D3501 Benign neoplasm of right adrenal gland: Secondary | ICD-10-CM | POA: Diagnosis not present

## 2023-11-22 MED ORDER — SODIUM CHLORIDE (PF) 0.9 % IJ SOLN
INTRAMUSCULAR | Status: AC
  Filled 2023-11-22: qty 50

## 2023-11-22 MED ORDER — IOHEXOL 300 MG/ML  SOLN
100.0000 mL | Freq: Once | INTRAMUSCULAR | Status: AC | PRN
  Administered 2023-11-22: 100 mL via INTRAVENOUS

## 2023-11-22 MED ORDER — HEPARIN SOD (PORK) LOCK FLUSH 100 UNIT/ML IV SOLN
INTRAVENOUS | Status: AC
  Filled 2023-11-22: qty 5

## 2023-11-22 MED ORDER — IOHEXOL 9 MG/ML PO SOLN
ORAL | Status: AC
  Filled 2023-11-22: qty 1000

## 2023-11-22 MED ORDER — IOHEXOL 9 MG/ML PO SOLN
1000.0000 mL | Freq: Once | ORAL | Status: AC
  Administered 2023-11-22: 1000 mL via ORAL

## 2023-11-23 ENCOUNTER — Encounter: Payer: Self-pay | Admitting: Hematology and Oncology

## 2023-11-26 ENCOUNTER — Telehealth: Payer: Self-pay | Admitting: *Deleted

## 2023-11-26 NOTE — Telephone Encounter (Signed)
 Patient called checking on the status of dental clearance.  Still on review with Dr Lonn.  Patient asks to give call when she is done with it as she is wanting to get hard copy to bring to dentist.

## 2023-11-27 ENCOUNTER — Telehealth: Payer: Self-pay

## 2023-11-27 ENCOUNTER — Encounter: Payer: Self-pay | Admitting: Hematology and Oncology

## 2023-11-27 NOTE — Telephone Encounter (Signed)
 Called pt to let her know we are finished with her dental clearance and have faxed it and all supporting documents to Therisa Morrow at [phone]. Pt would like to pick up original copy. This was placed in an envelope and given to front desk staff.

## 2023-11-27 NOTE — Telephone Encounter (Signed)
 Error - duplicate

## 2023-11-29 ENCOUNTER — Encounter: Payer: Self-pay | Admitting: Hematology and Oncology

## 2023-11-29 ENCOUNTER — Inpatient Hospital Stay: Admitting: Hematology and Oncology

## 2023-11-29 ENCOUNTER — Inpatient Hospital Stay

## 2023-11-29 VITALS — BP 147/93 | HR 71 | Temp 97.4°F | Resp 18 | Ht 68.0 in | Wt 172.2 lb

## 2023-11-29 DIAGNOSIS — T451X5A Adverse effect of antineoplastic and immunosuppressive drugs, initial encounter: Secondary | ICD-10-CM | POA: Diagnosis not present

## 2023-11-29 DIAGNOSIS — D701 Agranulocytosis secondary to cancer chemotherapy: Secondary | ICD-10-CM | POA: Diagnosis not present

## 2023-11-29 DIAGNOSIS — K122 Cellulitis and abscess of mouth: Secondary | ICD-10-CM | POA: Diagnosis not present

## 2023-11-29 DIAGNOSIS — C563 Malignant neoplasm of bilateral ovaries: Secondary | ICD-10-CM

## 2023-11-29 DIAGNOSIS — Z95828 Presence of other vascular implants and grafts: Secondary | ICD-10-CM

## 2023-11-29 DIAGNOSIS — Z5111 Encounter for antineoplastic chemotherapy: Secondary | ICD-10-CM | POA: Diagnosis not present

## 2023-11-29 LAB — CBC WITH DIFFERENTIAL (CANCER CENTER ONLY)
Abs Immature Granulocytes: 0.01 K/uL (ref 0.00–0.07)
Basophils Absolute: 0 K/uL (ref 0.0–0.1)
Basophils Relative: 1 %
Eosinophils Absolute: 0.1 K/uL (ref 0.0–0.5)
Eosinophils Relative: 2 %
HCT: 38.2 % (ref 36.0–46.0)
Hemoglobin: 13 g/dL (ref 12.0–15.0)
Immature Granulocytes: 0 %
Lymphocytes Relative: 51 %
Lymphs Abs: 2 K/uL (ref 0.7–4.0)
MCH: 34.5 pg — ABNORMAL HIGH (ref 26.0–34.0)
MCHC: 34 g/dL (ref 30.0–36.0)
MCV: 101.3 fL — ABNORMAL HIGH (ref 80.0–100.0)
Monocytes Absolute: 0.7 K/uL (ref 0.1–1.0)
Monocytes Relative: 17 %
Neutro Abs: 1.1 K/uL — ABNORMAL LOW (ref 1.7–7.7)
Neutrophils Relative %: 29 %
Platelet Count: 270 K/uL (ref 150–400)
RBC: 3.77 MIL/uL — ABNORMAL LOW (ref 3.87–5.11)
RDW: 12.6 % (ref 11.5–15.5)
WBC Count: 3.9 K/uL — ABNORMAL LOW (ref 4.0–10.5)
nRBC: 0 % (ref 0.0–0.2)

## 2023-11-29 LAB — CMP (CANCER CENTER ONLY)
ALT: 19 U/L (ref 0–44)
AST: 17 U/L (ref 15–41)
Albumin: 4 g/dL (ref 3.5–5.0)
Alkaline Phosphatase: 70 U/L (ref 38–126)
Anion gap: 6 (ref 5–15)
BUN: 22 mg/dL (ref 8–23)
CO2: 27 mmol/L (ref 22–32)
Calcium: 9 mg/dL (ref 8.9–10.3)
Chloride: 107 mmol/L (ref 98–111)
Creatinine: 1 mg/dL (ref 0.44–1.00)
GFR, Estimated: >60 mL/min (ref >60)
Glucose, Bld: 126 mg/dL — ABNORMAL HIGH (ref 70–99)
Potassium: 4 mmol/L (ref 3.5–5.1)
Sodium: 140 mmol/L (ref 135–145)
Total Bilirubin: 0.3 mg/dL (ref 0.0–1.2)
Total Protein: 7 g/dL (ref 6.5–8.1)

## 2023-11-29 MED ORDER — SODIUM CHLORIDE 0.9% FLUSH
10.0000 mL | Freq: Once | INTRAVENOUS | Status: AC
Start: 2023-11-29 — End: 2023-11-29
  Administered 2023-11-29: 10 mL

## 2023-11-29 MED ORDER — HEPARIN SOD (PORK) LOCK FLUSH 100 UNIT/ML IV SOLN
500.0000 [IU] | Freq: Once | INTRAVENOUS | Status: AC
Start: 2023-11-29 — End: 2023-11-29
  Administered 2023-11-29: 500 [IU]

## 2023-11-29 NOTE — Assessment & Plan Note (Addendum)
 She has recurrent ovarian cancer, platinum sensitive High grade serous, p53 mutated, Neg genetics, HRD not detected, MSI stable, low TMB 2  CT imaging from April showed worsening disease and her treatment is switched to paclitaxel  Treatment course was complicated by intermittent pancytopenia, peripheral neuropathy and tooth infection I reviewed CT imaging from July 2025 which showed worsening disease control She has progressed on carboplatin , gemcitabine , bevacizumab  and paclitaxel   I reviewed the NCCN guidelines Other treatment options included liposomal doxorubicin or topotecan The risk, benefits, side effects of these agents were discussed with the patient and she is undecided She will call me tomorrow with final decision She is aware that treatment goal is no longer considered curable

## 2023-11-29 NOTE — Assessment & Plan Note (Addendum)
 I have completed form and sent back to her dentist to proceed with tooth extraction before we proceed with chemotherapy

## 2023-11-29 NOTE — Assessment & Plan Note (Addendum)
 She is not neutropenic She will proceed with dental clearance

## 2023-11-29 NOTE — Progress Notes (Signed)
 [facility] OFFICE PROGRESS NOTE  Patient Care Team: Arloa Elsie SAUNDERS, MD as PCP - General (Family Medicine) [name] (Physician Assistant)  Assessment & Plan Bilateral primary ovarian cancer [facility]) She has recurrent ovarian cancer, platinum sensitive High grade serous, p53 mutated, Neg genetics, HRD not detected, MSI stable, low TMB 2  CT imaging from April showed worsening disease and her treatment is switched to paclitaxel  Treatment course was complicated by intermittent pancytopenia, peripheral neuropathy and tooth infection I reviewed CT imaging from July 2025 which showed worsening disease control She has progressed on carboplatin , gemcitabine , bevacizumab  and paclitaxel   I reviewed the NCCN guidelines Other treatment options included liposomal doxorubicin or topotecan The risk, benefits, side effects of these agents were discussed with the patient and she is undecided She will call me tomorrow with final decision She is aware that treatment goal is no longer considered curable Oral infection I have completed form and sent back to her dentist to proceed with tooth extraction before we proceed with chemotherapy Leukopenia due to antineoplastic chemotherapy [employer]) She is not neutropenic She will proceed with dental clearance  No orders of the defined types were placed in this encounter.    Yolanda Bedford, MD  INTERVAL HISTORY: she returns for surveillance follow-up and review of test results When I saw the patient last, she is in the process of getting dental extraction That has not been accomplished We spent majority of time reviewing multiple imaging studies and discussing other treatment options She is not symptomatic from regional lymphadenopathy  PHYSICAL EXAMINATION: ECOG PERFORMANCE STATUS: 1 - Symptomatic but completely ambulatory  Vitals:   11/29/23 0825  BP: (!) 147/93  Pulse: 71  Resp: 18  Temp: (!) 97.4 F (36.3 C)  SpO2: 100%    Filed Weights   11/29/23 0825  Weight: 172 lb 3.2 oz (78.1 kg)    Relevant data reviewed during this visit included CBC, CMP, CT imaging from April 2025 and July 2025

## 2023-11-30 ENCOUNTER — Telehealth: Payer: Self-pay | Admitting: Oncology

## 2023-11-30 NOTE — Telephone Encounter (Signed)
 Called [name] regarding her treatment decision. She is still thinking about it and would like to talk with her sisters this weekend.  She will call back Monday with a decision.

## 2023-12-03 ENCOUNTER — Encounter: Payer: Self-pay | Admitting: Hematology and Oncology

## 2023-12-03 NOTE — Telephone Encounter (Signed)
 Her disease will progress to the point of no return without treatment in 3 months Please schedule another visit next week to discuss

## 2023-12-03 NOTE — Telephone Encounter (Signed)
 Called [name] back and scheduled an appointment on 12/13/2023 at 11:00 to discuss her decision with Dr. Lonn.

## 2023-12-03 NOTE — Telephone Encounter (Signed)
 [name] called back and would like to take a treatment break for 3 months.  She said she has to do this for her spiritual health.  She is wondering if she could continue to have blood work monthly and then a CT scan in 3 months.

## 2023-12-04 DIAGNOSIS — M792 Neuralgia and neuritis, unspecified: Secondary | ICD-10-CM | POA: Diagnosis not present

## 2023-12-04 DIAGNOSIS — G35 Multiple sclerosis: Secondary | ICD-10-CM | POA: Diagnosis not present

## 2023-12-04 DIAGNOSIS — C799 Secondary malignant neoplasm of unspecified site: Secondary | ICD-10-CM | POA: Diagnosis not present

## 2023-12-06 ENCOUNTER — Other Ambulatory Visit

## 2023-12-06 ENCOUNTER — Ambulatory Visit: Admitting: Hematology and Oncology

## 2023-12-06 ENCOUNTER — Ambulatory Visit

## 2023-12-12 ENCOUNTER — Telehealth: Payer: Self-pay

## 2023-12-12 NOTE — Telephone Encounter (Signed)
 Pt called, frantic thinking that she missed her MD appt today. She was advised that her appt is tomorrow at 1100. She verbalized understanding and confirmed she will be here.

## 2023-12-13 ENCOUNTER — Telehealth: Payer: Self-pay

## 2023-12-13 ENCOUNTER — Inpatient Hospital Stay: Admitting: Licensed Clinical Social Worker

## 2023-12-13 ENCOUNTER — Encounter: Payer: Self-pay | Admitting: Hematology and Oncology

## 2023-12-13 ENCOUNTER — Inpatient Hospital Stay: Attending: Gynecologic Oncology | Admitting: Hematology and Oncology

## 2023-12-13 VITALS — BP 135/100 | HR 93 | Temp 97.8°F | Resp 18 | Ht 68.0 in | Wt 180.0 lb

## 2023-12-13 DIAGNOSIS — Z7189 Other specified counseling: Secondary | ICD-10-CM | POA: Diagnosis not present

## 2023-12-13 DIAGNOSIS — Z515 Encounter for palliative care: Secondary | ICD-10-CM | POA: Insufficient documentation

## 2023-12-13 DIAGNOSIS — C563 Malignant neoplasm of bilateral ovaries: Secondary | ICD-10-CM | POA: Insufficient documentation

## 2023-12-13 NOTE — Progress Notes (Signed)
  [facility] OFFICE PROGRESS NOTE  Patient Care Team: Arloa Elsie SAUNDERS, MD as PCP - General (Family Medicine) [name] (Physician Assistant)  Assessment & Plan Bilateral primary ovarian cancer [facility]) She has recurrent ovarian cancer, platinum sensitive High grade serous, p53 mutated, Neg genetics, HRD not detected, MSI stable, low TMB 2  CT imaging from April showed worsening disease and her treatment is switched to paclitaxel  Treatment course was complicated by intermittent pancytopenia, peripheral neuropathy and tooth infection I reviewed CT imaging from July 2025 which showed worsening disease control She has progressed on carboplatin , gemcitabine , bevacizumab  and paclitaxel   The patient is somewhat reluctant to commit to further systemic treatment at this point Since discontinuation of chemotherapy, she felt better and have better quality of life She is contemplating seeking alternative treatment at this point We discussed risk and benefits of withholding treatment for a few months The patient is aware of the risks I educated the patient signs and symptoms to watch out for disease progression I will see her again in the month for further follow-up with repeat labs Goals of care, counseling/discussion I briefly touched on goals of care today She is aware that there is a risk that she might not feel good enough to return back to treatment in the future  Orders Placed This Encounter  Procedures   Ambulatory Referral to [facility] Nutrition    Referral Priority:   Routine    Referral Type:   Consultation    Referral Reason:   Specialty Services Required    Number of Visits Requested:   1     [name], MD  INTERVAL HISTORY: she returns for further discussions about goals of care Since her last visit, she is contemplating alternative medicine She is already feeling better since discontinuation of chemotherapy She would like to eat healthier and going  on organic produce She wants a visit with the dietitian She is researching online supplements that could help delay cancer progression Currently, she denies abdominal pain, nausea, changes in bowel habits or urination  PHYSICAL EXAMINATION: ECOG PERFORMANCE STATUS: 0 - Asymptomatic  Vitals:   12/13/23 1122  BP: (!) 135/100  Pulse: 93  Resp: 18  Temp: 97.8 F (36.6 C)  SpO2: 100%   Filed Weights   12/13/23 1122  Weight: 180 lb (81.6 kg)

## 2023-12-13 NOTE — Assessment & Plan Note (Addendum)
 She has recurrent ovarian cancer, platinum sensitive High grade serous, p53 mutated, Neg genetics, HRD not detected, MSI stable, low TMB 2  CT imaging from April showed worsening disease and her treatment is switched to paclitaxel  Treatment course was complicated by intermittent pancytopenia, peripheral neuropathy and tooth infection I reviewed CT imaging from July 2025 which showed worsening disease control She has progressed on carboplatin , gemcitabine , bevacizumab  and paclitaxel   The patient is somewhat reluctant to commit to further systemic treatment at this point Since discontinuation of chemotherapy, she felt better and have better quality of life She is contemplating seeking alternative treatment at this point We discussed risk and benefits of withholding treatment for a few months The patient is aware of the risks I educated the patient signs and symptoms to watch out for disease progression I will see her again in the month for further follow-up with repeat labs

## 2023-12-13 NOTE — Assessment & Plan Note (Addendum)
 I briefly touched on goals of care today She is aware that there is a risk that she might not feel good enough to return back to treatment in the future

## 2023-12-13 NOTE — Telephone Encounter (Signed)
 Attempted to call patient on primary number regarding today's appointment. No answer. Left voicemail providing patient with clinic number to reschedule appointments.  MD aware.

## 2023-12-13 NOTE — Progress Notes (Signed)
 [facility] CSW Progress Note  Clinical Child psychotherapist contacted patient by phone to follow-up on question about resources.  Pt previously received a [employer] card and was wondering about who to contact for additional.  CSW shared information for [employer] and informed pt that they may not have any more as their funding was running low.    Follow Up Plan:  Patient will contact CSW with any support or resource needs    Parks Czajkowski E Lillis Nuttle, LCSW Clinical Social Worker [facility]

## 2023-12-18 ENCOUNTER — Other Ambulatory Visit: Payer: 59

## 2023-12-18 ENCOUNTER — Encounter: Payer: Self-pay | Admitting: Hematology and Oncology

## 2023-12-25 ENCOUNTER — Ambulatory Visit: Payer: 59 | Admitting: Hematology and Oncology

## 2024-01-03 NOTE — Progress Notes (Signed)
 Nutrition  Referral placed for nutrition assessment  Called patient but no answer.  Left message requesting call back.    Tamara Kenyon B. Dasie SOLON, CSO, LDN Registered Dietitian [phone]

## 2024-01-08 ENCOUNTER — Telehealth: Payer: Self-pay | Admitting: Hematology and Oncology

## 2024-01-08 NOTE — Telephone Encounter (Signed)
 Called to schedule an appointment per referral. Left VM for the patient to call back to schedule an appointment.

## 2024-01-11 ENCOUNTER — Encounter: Payer: Self-pay | Admitting: Hematology and Oncology

## 2024-01-11 ENCOUNTER — Inpatient Hospital Stay: Attending: Gynecologic Oncology

## 2024-01-11 ENCOUNTER — Inpatient Hospital Stay (HOSPITAL_BASED_OUTPATIENT_CLINIC_OR_DEPARTMENT_OTHER): Admitting: Hematology and Oncology

## 2024-01-11 VITALS — BP 136/77 | HR 72 | Temp 97.9°F | Resp 18 | Ht 68.0 in | Wt 169.4 lb

## 2024-01-11 DIAGNOSIS — G620 Drug-induced polyneuropathy: Secondary | ICD-10-CM

## 2024-01-11 DIAGNOSIS — C563 Malignant neoplasm of bilateral ovaries: Secondary | ICD-10-CM | POA: Insufficient documentation

## 2024-01-11 DIAGNOSIS — D61818 Other pancytopenia: Secondary | ICD-10-CM | POA: Insufficient documentation

## 2024-01-11 DIAGNOSIS — T451X5A Adverse effect of antineoplastic and immunosuppressive drugs, initial encounter: Secondary | ICD-10-CM | POA: Diagnosis not present

## 2024-01-11 LAB — COMPREHENSIVE METABOLIC PANEL WITH GFR
ALT: 16 U/L (ref 0–44)
AST: 17 U/L (ref 15–41)
Albumin: 4.4 g/dL (ref 3.5–5.0)
Alkaline Phosphatase: 75 U/L (ref 38–126)
Anion gap: 3 — ABNORMAL LOW (ref 5–15)
BUN: 19 mg/dL (ref 8–23)
CO2: 29 mmol/L (ref 22–32)
Calcium: 9.4 mg/dL (ref 8.9–10.3)
Chloride: 105 mmol/L (ref 98–111)
Creatinine, Ser: 0.92 mg/dL (ref 0.44–1.00)
GFR, Estimated: >60 mL/min (ref >60)
Glucose, Bld: 114 mg/dL — ABNORMAL HIGH (ref 70–99)
Potassium: 4.5 mmol/L (ref 3.5–5.1)
Sodium: 137 mmol/L (ref 135–145)
Total Bilirubin: 0.3 mg/dL (ref 0.0–1.2)
Total Protein: 7.2 g/dL (ref 6.5–8.1)

## 2024-01-11 LAB — CBC WITH DIFFERENTIAL/PLATELET
Abs Immature Granulocytes: 0.01 K/uL (ref 0.00–0.07)
Basophils Absolute: 0.1 K/uL (ref 0.0–0.1)
Basophils Relative: 1 %
Eosinophils Absolute: 0.2 K/uL (ref 0.0–0.5)
Eosinophils Relative: 5 %
HCT: 37.6 % (ref 36.0–46.0)
Hemoglobin: 12.6 g/dL (ref 12.0–15.0)
Immature Granulocytes: 0 %
Lymphocytes Relative: 40 %
Lymphs Abs: 1.8 K/uL (ref 0.7–4.0)
MCH: 33.2 pg (ref 26.0–34.0)
MCHC: 33.5 g/dL (ref 30.0–36.0)
MCV: 99.2 fL (ref 80.0–100.0)
Monocytes Absolute: 0.7 K/uL (ref 0.1–1.0)
Monocytes Relative: 16 %
Neutro Abs: 1.7 K/uL (ref 1.7–7.7)
Neutrophils Relative %: 38 %
Platelets: 230 K/uL (ref 150–400)
RBC: 3.79 MIL/uL — ABNORMAL LOW (ref 3.87–5.11)
RDW: 12.3 % (ref 11.5–15.5)
WBC: 4.5 K/uL (ref 4.0–10.5)
nRBC: 0 % (ref 0.0–0.2)

## 2024-01-11 NOTE — Assessment & Plan Note (Addendum)
 She has recurrent ovarian cancer, platinum sensitive High grade serous, p53 mutated, Neg genetics, HRD not detected, MSI stable, low TMB 2  CT imaging from April showed worsening disease and her treatment is switched to paclitaxel  Treatment course was complicated by intermittent pancytopenia, peripheral neuropathy and tooth infection I reviewed CT imaging from July 2025 which showed worsening disease control She has progressed on carboplatin , gemcitabine , bevacizumab   Since discontinuation of chemotherapy, she felt better and have better quality of life She is contemplating seeking alternative treatment at this point We discussed risk and benefits of withholding treatment for a few months The patient is aware of the risks I educated the patient signs and symptoms to watch out for disease progression We discussed risk and benefits of imaging study The patient is interested to pursue further chemotherapy if imaging studies show worsening disease I will see her next month for further follow-up will repeat imaging study

## 2024-01-11 NOTE — Progress Notes (Signed)
 [facility] OFFICE PROGRESS NOTE  Patient Care Team: Arloa Elsie SAUNDERS, MD as PCP - General (Family Medicine) [name] (Physician Assistant)  Assessment & Plan Bilateral primary ovarian cancer [name]) She has recurrent ovarian cancer, platinum sensitive High grade serous, p53 mutated, Neg genetics, HRD not detected, MSI stable, low TMB 2  CT imaging from April showed worsening disease and her treatment is switched to paclitaxel  Treatment course was complicated by intermittent pancytopenia, peripheral neuropathy and tooth infection I reviewed CT imaging from July 2025 which showed worsening disease control She has progressed on carboplatin , gemcitabine , bevacizumab   Since discontinuation of chemotherapy, she felt better and have better quality of life She is contemplating seeking alternative treatment at this point We discussed risk and benefits of withholding treatment for a few months The patient is aware of the risks I educated the patient signs and symptoms to watch out for disease progression We discussed risk and benefits of imaging study The patient is interested to pursue further chemotherapy if imaging studies show worsening disease I will see her next month for further follow-up will repeat imaging study 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:   [facility]    If indicated for the ordered procedure, I authorize the administration of oral contrast media per Radiology protocol:   No    Reason for no oral contrast::   No need oral contrast     [name], MD  INTERVAL HISTORY: she returns for surveillance follow-up for recurrent ovarian cancer She is here accompanied by her cousin She is pursuing alternative treatment by herself She denies abdominal pain, nausea or changes in bowel habits It appears that she has lost some weight but she felt great She denies sensation of bloating  PHYSICAL EXAMINATION: ECOG PERFORMANCE STATUS: 0 - Asymptomatic  Vitals:   01/11/24 1058  BP: 136/77  Pulse: 72  Resp: 18  Temp: 97.9 F (36.6 C)  SpO2: 100%   Filed Weights   01/11/24 1058  Weight: 169 lb 6.4 oz (76.8 kg)    Relevant data reviewed during this visit included CBC CMP

## 2024-01-11 NOTE — Assessment & Plan Note (Addendum)
 Overall stable She does not need additional treatment for this

## 2024-01-11 NOTE — Assessment & Plan Note (Addendum)
 This has resolved with discontinuation of treatment

## 2024-01-17 ENCOUNTER — Other Ambulatory Visit: Payer: Self-pay | Admitting: Physician Assistant

## 2024-01-17 DIAGNOSIS — G35 Multiple sclerosis: Secondary | ICD-10-CM

## 2024-01-22 DIAGNOSIS — M79672 Pain in left foot: Secondary | ICD-10-CM | POA: Diagnosis not present

## 2024-01-22 DIAGNOSIS — M1990 Unspecified osteoarthritis, unspecified site: Secondary | ICD-10-CM | POA: Diagnosis not present

## 2024-01-22 DIAGNOSIS — M25512 Pain in left shoulder: Secondary | ICD-10-CM | POA: Diagnosis not present

## 2024-01-22 DIAGNOSIS — Z7969 Long term (current) use of other immunomodulators and immunosuppressants: Secondary | ICD-10-CM | POA: Diagnosis not present

## 2024-01-22 DIAGNOSIS — M0609 Rheumatoid arthritis without rheumatoid factor, multiple sites: Secondary | ICD-10-CM | POA: Diagnosis not present

## 2024-01-22 DIAGNOSIS — C762 Malignant neoplasm of abdomen: Secondary | ICD-10-CM | POA: Diagnosis not present

## 2024-01-22 DIAGNOSIS — M79641 Pain in right hand: Secondary | ICD-10-CM | POA: Diagnosis not present

## 2024-01-22 DIAGNOSIS — M25522 Pain in left elbow: Secondary | ICD-10-CM | POA: Diagnosis not present

## 2024-01-22 DIAGNOSIS — G35 Multiple sclerosis: Secondary | ICD-10-CM | POA: Diagnosis not present

## 2024-01-22 DIAGNOSIS — M79671 Pain in right foot: Secondary | ICD-10-CM | POA: Diagnosis not present

## 2024-01-22 DIAGNOSIS — M79642 Pain in left hand: Secondary | ICD-10-CM | POA: Diagnosis not present

## 2024-01-31 ENCOUNTER — Encounter: Payer: Self-pay | Admitting: Hematology and Oncology

## 2024-02-14 ENCOUNTER — Encounter: Payer: Self-pay | Admitting: Hematology and Oncology

## 2024-02-14 ENCOUNTER — Inpatient Hospital Stay
Admission: RE | Admit: 2024-02-14 | Discharge: 2024-02-14 | Attending: Physician Assistant | Admitting: Physician Assistant

## 2024-02-14 DIAGNOSIS — G35D Multiple sclerosis, unspecified: Secondary | ICD-10-CM

## 2024-02-14 MED ORDER — GADOPICLENOL 0.5 MMOL/ML IV SOLN
8.0000 mL | Freq: Once | INTRAVENOUS | Status: AC | PRN
  Administered 2024-02-14: 8 mL via INTRAVENOUS

## 2024-02-22 ENCOUNTER — Inpatient Hospital Stay: Attending: Gynecologic Oncology

## 2024-02-22 ENCOUNTER — Ambulatory Visit (HOSPITAL_COMMUNITY)
Admission: RE | Admit: 2024-02-22 | Discharge: 2024-02-22 | Disposition: A | Discharge status: Home / Self Care | Source: Ambulatory Visit | Attending: Hematology and Oncology | Admitting: Hematology and Oncology

## 2024-02-22 DIAGNOSIS — R110 Nausea: Secondary | ICD-10-CM | POA: Insufficient documentation

## 2024-02-22 DIAGNOSIS — C563 Malignant neoplasm of bilateral ovaries: Secondary | ICD-10-CM | POA: Insufficient documentation

## 2024-02-22 DIAGNOSIS — N201 Calculus of ureter: Secondary | ICD-10-CM | POA: Insufficient documentation

## 2024-02-22 DIAGNOSIS — Q630 Accessory kidney: Secondary | ICD-10-CM | POA: Diagnosis not present

## 2024-02-22 DIAGNOSIS — R634 Abnormal weight loss: Secondary | ICD-10-CM | POA: Insufficient documentation

## 2024-02-22 DIAGNOSIS — K8689 Other specified diseases of pancreas: Secondary | ICD-10-CM | POA: Diagnosis not present

## 2024-02-22 LAB — COMPREHENSIVE METABOLIC PANEL WITH GFR
ALT: 13 U/L (ref 0–44)
AST: 15 U/L (ref 15–41)
Albumin: 4.2 g/dL (ref 3.5–5.0)
Alkaline Phosphatase: 77 U/L (ref 38–126)
Anion gap: 4 — ABNORMAL LOW (ref 5–15)
BUN: 15 mg/dL (ref 8–23)
CO2: 28 mmol/L (ref 22–32)
Calcium: 9.7 mg/dL (ref 8.9–10.3)
Chloride: 104 mmol/L (ref 98–111)
Creatinine, Ser: 0.91 mg/dL (ref 0.44–1.00)
GFR, Estimated: >60 mL/min (ref >60)
Glucose, Bld: 123 mg/dL — ABNORMAL HIGH (ref 70–99)
Potassium: 3.8 mmol/L (ref 3.5–5.1)
Sodium: 136 mmol/L (ref 135–145)
Total Bilirubin: 0.3 mg/dL (ref 0.0–1.2)
Total Protein: 7.1 g/dL (ref 6.5–8.1)

## 2024-02-22 LAB — CBC WITH DIFFERENTIAL/PLATELET
Abs Immature Granulocytes: 0.01 K/uL (ref 0.00–0.07)
Basophils Absolute: 0 K/uL (ref 0.0–0.1)
Basophils Relative: 1 %
Eosinophils Absolute: 0.1 K/uL (ref 0.0–0.5)
Eosinophils Relative: 2 %
HCT: 36.6 % (ref 36.0–46.0)
Hemoglobin: 12.9 g/dL (ref 12.0–15.0)
Immature Granulocytes: 0 %
Lymphocytes Relative: 29 %
Lymphs Abs: 1.2 K/uL (ref 0.7–4.0)
MCH: 33.2 pg (ref 26.0–34.0)
MCHC: 35.2 g/dL (ref 30.0–36.0)
MCV: 94.1 fL (ref 80.0–100.0)
Monocytes Absolute: 0.5 K/uL (ref 0.1–1.0)
Monocytes Relative: 12 %
Neutro Abs: 2.3 K/uL (ref 1.7–7.7)
Neutrophils Relative %: 56 %
Platelets: 244 K/uL (ref 150–400)
RBC: 3.89 MIL/uL (ref 3.87–5.11)
RDW: 11.9 % (ref 11.5–15.5)
WBC: 4.1 K/uL (ref 4.0–10.5)
nRBC: 0 % (ref 0.0–0.2)

## 2024-02-22 MED ORDER — HEPARIN SOD (PORK) LOCK FLUSH 100 UNIT/ML IV SOLN
500.0000 [IU] | Freq: Once | INTRAVENOUS | Status: AC
  Administered 2024-02-22: 500 [IU] via INTRAVENOUS

## 2024-02-22 MED ORDER — IOHEXOL 300 MG/ML  SOLN
100.0000 mL | Freq: Once | INTRAMUSCULAR | Status: AC | PRN
  Administered 2024-02-22: 100 mL via INTRAVENOUS

## 2024-02-26 NOTE — Progress Notes (Signed)
 [facility] Scaling and Root Planing Appointment  [name] is a 68 y.o. female identified by name who presents to the [facility] for scaling & root planing the following quadrants: upper left per treatment plan.   Summary:   SUBJECTIVE Patient denies changes to medical history or medication list. Patient denies recent history of pain, swelling, bleeding, infection.  Per patient's doctors recommendation, 2 g of amoxicillin  was given 1 hour prior to starting SRP.  Past Medical History[1]  Past Surgical History[2]  Allergies[3]  Current Medications[4]  Social History   Tobacco Use  . Smoking status: Some Days    Types: Cigarettes  . Smokeless tobacco: Never  . Tobacco comments:    Currently smokes 1 pack per week.  Substance Use Topics  . Alcohol use: Yes    Comment: glass of wine rarely    Family History[5]  ASA classification: ASA III  OBJECTIVE There were no vitals filed for this visit. Oral hygiene fair   Periodontal diagnosis: Stage III, Grade B periodontitis Presence of generalized Subgingival Calculus Plaque present: Yes  moderate Calculus present: Yes  moderate  Procedure:  Anesthesia: Topical anesthesia (Benzocaine 20%) applied.  Local Anesthesia:  2 carpule Septocaine 4% with epinephrine  1:100,000 via infiltration Aspiration negative  Ultrasonic with green tip cavitron and selective hand scaling completed in UL quadrant. Reviewed [facility].  Extraoral findings: 2 mm x 2mm red lesion on right upper [place] border of lip; asked patient about lesion and patient states this is a birth mark. Intraoral findings: 1) Grey 3 mm x 3 mm present in lower right buccal mucosa apical to #31; clinical impression is amalgam tattoo. 2) Palate appears red and tongue appears atrophic and red in center. Clinical findings consistent with rhomboid glossitis. Asked patient if she has felt sick recently; patient reports runny  eyes and states that her medications cause her to be immunosuppressed. Patient has appointment with Doctor in 3 days. Recommended patient to talk with doctor and ask if he medications cause her to be immunosuppressed or if she is anemic. Patient was given chlorhexidine  rinse and instructed on use. Patient to return in 2 weeks for next appointment. We will re-evaluate tongue and palate to look for resolution of findings, if lesions are still present we will prescribe antifungal.   Questions invited and answered. Patient tolerated the treatment well and left in stable condition satisfied with today's appointment.  Provider: Therisa Morrow, DDS Candidate [phone] Attending: Dr. LIZZIE   Procedure completed today: SRP UL quadrants Next Visit: Wax tooth try-in for acrylic RPD; SRP of LR and LL       [1] Past Medical History: Diagnosis Date  . Age related osteoporosis   . Anxiety    history of panic attacks and claustrophobia  . Arthritis   . Asthma (HHS-HCC)    mild-rare use of inhaler; when she was younger was a problem  . Bronchitis   . Chronic low back pain   . DDD (degenerative disc disease), lumbar   . 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 History of hepatitis C 03/2015  . Hypertension   . Kidney stone on right side   . Multiple sclerosis    Diagnosed in 2021  . Ovarian cancer    (CMS-HCC)    Was diagnosed stage IV in December 2023  . PONV (postoperative nausea and vomiting)    nausea  . Rheumatoid arthritis    (CMS-HCC) 08/31/2012  .  Spinal stenosis of lumbar region   [2] Past Surgical History: Procedure Laterality Date  . DIAGNOSTIC LAPAROSCOPY     for endometriosis  . HYSTERECTOMY    . LIVER BIOPSY     Was completed about 12 years prior for hepatitis C diagnosis. Patient states she is now cleared.  . LUMBAR DISC SURGERY  1999   L4L5 herniated disc  . PR ALLOGRAFT FOR SPINE SURGERY ONLY MORSELIZED N/A 12/25/2016   Procedure: ALLOGRAFT FOR SPINE  SURGERY ONLY; MORSELIZED;  Surgeon: Royden Elsie Schneider, MD;  Location: OR [facility];  Service: Orthopedics  . PR ARTHRODESIS ANT INTERBODY MIN DISCECTOMY,EA ADDL N/A 12/25/2016   Procedure: ARTHRODESIS, ANTERIOR INTERBODY TECHNIQUE, W/MINIMAL DISKECTOMY TO PREP INTERSPACE; EACH ADD`L INTERSPACE;  Surgeon: Royden Elsie Schneider, MD;  Location: OR [facility];  Service: Orthopedics  . PR ARTHRODESIS ANT INTERBODY MIN DISCECTOMY,LUMBAR N/A 12/25/2016   Procedure: ANTERIOR/POSTERIOR L4-S1, INSTRUMENTED FUSION WITH ALLOGRAFT;  Surgeon: Royden Elsie Schneider, MD;  Location: OR [facility];  Service: Orthopedics  . PR ARTHRODESIS POSTERIOR/PSTLAT TQ 1NTRSPC LUMBAR N/A 12/25/2016   Procedure: ARTHRODESIS, POSTERIOR OR POSTEROLATERAL TECH, SINGLE LEVEL; LUMBAR(LATERAL TRANSVERSE TECHNIQUE IF DONE);  Surgeon: Royden Elsie Schneider, MD;  Location: OR [facility];  Service: Orthopedics  . PR ARTHRODESIS PST/PSTLAT TQ 1NTRSPC EA ADDL NTRSPC N/A 12/25/2016   Procedure: ARTHRODESIS, POSTERIOR OR POSTEROLATERAL TECHNIQUE, SINGLE LEVEL; EACH ADDITIONAL VERTEBRAL SEGMENT;  Surgeon: Royden Elsie Schneider, MD;  Location: OR [facility];  Service: Orthopedics  . PR AUTOGRAFT SPINE SURGERY LOCAL FROM SAME INCISION N/A 12/25/2016   Procedure: AUTOGRAFT/SPINE SURG ONLY (W/HARVEST GRAFT); LOCAL (EG, RIB/SPINOUS PROC, LAM FRGMT) OBTAIN FROM SAME INCIS;  Surgeon: Royden Elsie Schneider, MD;  Location: OR [facility];  Service: Orthopedics  . PR BONE MARROW ASPIRATION BONE GRFG SPI SURG ONLY N/A 12/25/2016   Procedure: BONE MARROW ASPIRATION FOR BONE GRAFTING, SPINE SURGERY ONLY, THROUGH SEPARATE SKIN OR FASCIAL INCISION (LIST SEPARATELY IN ADDITION TO CODE FOR PRIMARY PROCEDURE);  Surgeon: Royden Elsie Schneider, MD;  Location: OR [facility];  Service: Orthopedics  . PR INSJ BIOMCHN DEV INTERVERTEBRAL DSC SPC W/ARTHRD N/A 12/25/2016   Procedure: INSERT INTERBODY BIOMECHANICAL DEVICE(S) WITH INTEGRAL ANTERIOR INSTRUMENT FOR DEVICE ANCHORING, WHEN PERFORMED, TO INTERVERTEBRAL DISC SPACE IN CONJUNCTION  WITH INTERBODY ARTHRODESIS, EACH INTERSPACE;  Surgeon: Royden Elsie Schneider, MD;  Location: OR [facility];  Service: Orthopedics  . PR POSTERIOR SEGMENTAL INSTRUMENTATION 3-6 VRT SEG N/A 12/25/2016   Procedure: POSTERIOR SEGMENT INSTRUMENT (EG, PEDICLE FIX, DUAL ROD W/MULT HOOK/SUBLAMINAL WIRE); 3 TO 6 VERTEB SEGMENT;  Surgeon: Royden Elsie Schneider, MD;  Location: OR [facility];  Service: Orthopedics  . PR STEREOTACTIC COMP ASSIST PROC,SPINAL N/A 12/25/2016   Procedure: STEREOTACTIC COMPUTER-ASSISTED (NAVIGATIONAL) PROCEDURE; SPINE;  Surgeon: Royden Elsie Schneider, MD;  Location: OR [facility];  Service: Orthopedics  . ROTATOR CUFF REPAIR Right 2013  [3] Allergies Allergen Reactions  . Adhesive Swelling and Rash    Local skin swelling at site Paper take OK  . Celexa [Citalopram] Swelling  . Duloxetine Shortness Of Breath, Swelling and Other (See Comments)    Tongue and leg swelling  . Fluticasone Furoate-Vilanterol Swelling  . Opana [Oxymorphone] Swelling    tongue  . Cyclobenzaprine Hcl Other (See Comments)    Extreme lethargy  . Gabapentin Nausea Only    lethargy  [4]  Current Outpatient Medications:  .  hydroxychloroquine (PLAQUENIL) 200 mg tablet, Take 1 tablet (200 mg total) by mouth two (2) times a day., Disp: , Rfl:  .  albuterol  (PROVENTIL  HFA;VENTOLIN  HFA) 90 mcg/actuation inhaler, Inhale 2 puffs every  six (6) hours as needed for wheezing., Disp: , Rfl:  .  ALPRAZolam (XANAX) 1 MG tablet, Take 1 mg by mouth nightly as needed for sleep. (Patient not taking: Reported on 05/16/2023), Disp: , Rfl:  .  baclofen (LIORESAL) 10 MG tablet, Take 1 tablet (10 mg total) by mouth Three (3) times a day., Disp: , Rfl:  .  dextroamphetamine -amphetamine  (ADDERALL  XR) 20 MG 24 hr capsule, Take 1 capsule (20 mg total) by mouth two (2) times a day., Disp: , Rfl:  .  fluticasone propion-salmeterol (WIXELA INHUB) 250-50 mcg/dose diskus, Inhale 1 puff two (2) times a day., Disp: , Rfl:  .  fluticasone-salmeterol (ADVAIR HFA)  230-21 mcg/actuation inhaler, Inhale 2 puffs Two (2) times a day. (Patient not taking: Reported on 05/16/2023), Disp: , Rfl:  .  irbesartan  (AVAPRO ) 300 MG tablet, Take 1 tablet (300 mg total) by mouth daily., Disp: , Rfl:  .  lisinopril (PRINIVIL,ZESTRIL) 30 MG tablet, Take 30 mg by mouth daily. (Patient not taking: Reported on 05/16/2023), Disp: , Rfl:  .  LORazepam  (ATIVAN ) 2 MG tablet, Take 10 tablets (20 mg total) by mouth in the morning., Disp: , Rfl:  .  methocarbamol (ROBAXIN) 500 MG tablet, Take 1 tablet (500 mg total) by mouth 4 (four) times a day as needed (muscle spasms). (Patient not taking: Reported on 05/16/2023), Disp: 120 tablet, Rfl: 0 .  morphine  sulfate (MORPHINE  ORAL), Take 15 mg by mouth continuous as needed. (Patient not taking: Reported on 05/16/2023), Disp: , Rfl:  .  olopatadine (PATANOL) 0.1 % ophthalmic solution, INSTILL 1 DROP INTO OU DAILY-PRN (Patient not taking: Reported on 05/16/2023), Disp: , Rfl:  .  omeprazole (PRILOSEC) 20 MG capsule, Take 1 capsule (20 mg total) by mouth daily. (Patient not taking: Reported on 07/02/2023), Disp: , Rfl:  .  ondansetron  (ZOFRAN ) 8 MG tablet, Take 1 tablet (8 mg total) by mouth., Disp: , Rfl:  .  oxyCODONE  (ROXICODONE ) 15 MG immediate release tablet, Take 20 mg by mouth two (2) times a day., Disp: , Rfl:  .  oxyCODONE  (XTAMPZA  ER) 13.5 mg CSpT 12 hr ER capsule sprinkle, Take 1 capsule (13.5 mg total) by mouth every twelve (12) hours., Disp: , Rfl:  .  prochlorperazine  (COMPAZINE ) 10 MG tablet, Take 1 tablet (10 mg total) by mouth., Disp: , Rfl:  .  varenicline (CHANTIX) 1 mg tablet, Take 1 tablet (1 mg total) by mouth daily., Disp: , Rfl:  [5] Family History Problem Relation Age of Onset  . Hypertension Mother   . Arthritis Mother   . Pancreatitis Father   . Anxiety disorder Sister   . Arthritis Sister   . Anxiety disorder Sister   . Arthritis Sister   . Anxiety disorder Sister   . Arthritis Sister

## 2024-02-29 ENCOUNTER — Encounter: Payer: Self-pay | Admitting: Hematology and Oncology

## 2024-02-29 ENCOUNTER — Inpatient Hospital Stay (HOSPITAL_BASED_OUTPATIENT_CLINIC_OR_DEPARTMENT_OTHER): Admitting: Hematology and Oncology

## 2024-02-29 ENCOUNTER — Inpatient Hospital Stay: Admitting: Dietician

## 2024-02-29 VITALS — BP 153/100 | HR 70 | Temp 97.7°F | Resp 18 | Ht 68.0 in | Wt 165.4 lb

## 2024-02-29 DIAGNOSIS — I10 Essential (primary) hypertension: Secondary | ICD-10-CM | POA: Diagnosis not present

## 2024-02-29 DIAGNOSIS — C563 Malignant neoplasm of bilateral ovaries: Secondary | ICD-10-CM

## 2024-02-29 DIAGNOSIS — R110 Nausea: Secondary | ICD-10-CM | POA: Diagnosis not present

## 2024-02-29 MED ORDER — ONDANSETRON HCL 8 MG PO TABS: 8.0000 mg | ORAL_TABLET | Freq: Three times a day (TID) | ORAL | 1 refills | Status: AC | PRN

## 2024-02-29 MED ORDER — PROCHLORPERAZINE MALEATE 10 MG PO TABS
10.0000 mg | ORAL_TABLET | Freq: Four times a day (QID) | ORAL | 1 refills | Status: AC | PRN
Start: 2024-02-29 — End: ?

## 2024-02-29 MED ORDER — OMEPRAZOLE 20 MG PO CPDR: 20.0000 mg | DELAYED_RELEASE_CAPSULE | Freq: Every day | ORAL | 1 refills | Status: DC

## 2024-02-29 NOTE — Assessment & Plan Note (Addendum)
 She has recurrent ovarian cancer, platinum sensitive High grade serous, p53 mutated, Neg genetics, HRD not detected, MSI stable, low TMB 2  CT imaging from April showed worsening disease and her treatment is switched to paclitaxel  Treatment course was complicated by intermittent pancytopenia, peripheral neuropathy and tooth infection I reviewed CT imaging from July 2025 which showed worsening disease control She has progressed on carboplatin , gemcitabine , bevacizumab   Since discontinuation of chemotherapy, she felt better and have better quality of life Apart from symptomatic reflux, and recent weight loss, she felt fine I reviewed CT imaging from October 2025 in comparison from imaging from July 2025 She has signs of imminent small bowel obstruction with enlarging mass near the portal vein We discussed risk and benefits of pursuing chemotherapy She still have unresolved dental issues The patient is really reluctant to pursue chemotherapy due to side effects experience Ultimately, I readdressed goals of care with her and she is comfortable not to pursue treatment I will see her again in a month for further follow-up

## 2024-02-29 NOTE — Progress Notes (Signed)
 [name]  Patient Care Team: Arloa Elsie SAUNDERS, MD as PCP - General (Family Medicine) [name] (Physician Assistant)  Assessment & Plan Bilateral primary ovarian cancer [facility]) She has recurrent ovarian cancer, platinum sensitive High grade serous, p53 mutated, Neg genetics, HRD not detected, MSI stable, low TMB 2  CT imaging from April showed worsening disease and her treatment is switched to paclitaxel  Treatment course was complicated by intermittent pancytopenia, peripheral neuropathy and tooth infection I reviewed CT imaging from July 2025 which showed worsening disease control She has progressed on carboplatin , gemcitabine , bevacizumab   Since discontinuation of chemotherapy, she felt better and have better quality of life Apart from symptomatic reflux, and recent weight loss, she felt fine I reviewed CT imaging from October 2025 in comparison from imaging from July 2025 She has signs of imminent small bowel obstruction with enlarging mass near the portal vein We discussed risk and benefits of pursuing chemotherapy She still have unresolved dental issues The patient is really reluctant to pursue chemotherapy due to side effects experience Ultimately, I readdressed goals of care with her and she is comfortable not to pursue treatment I will see her again in a month for further follow-up Nausea without vomiting I believe she has intermittent nausea due to 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    [name], MD  INTERVAL HISTORY: she  returns for surveillance follow-up review recent test results Her dental issue has not resolved yet She had recent reflux/nausea sensation No recent constipation I reviewed imaging studies and we discussed plan of care  PHYSICAL EXAMINATION: ECOG PERFORMANCE STATUS: 1 - Symptomatic but completely ambulatory  Vitals:   02/29/24 0921  BP: (!) 153/100  Pulse: 70  Resp: 18  Temp: 97.7 F (36.5 C)  SpO2: 100%   Filed Weights   02/29/24 0921  Weight: 165 lb 6.4 oz (75 kg)    Relevant data reviewed during this visit included CBC, CMP, CT imaging from July 2025 and October 2025

## 2024-02-29 NOTE — Assessment & Plan Note (Addendum)
 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

## 2024-02-29 NOTE — Assessment & Plan Note (Addendum)
 Her blood pressure is elevated today likely due to anxiety I noticed she has lost weight I anticipate her blood pressure will drop in the future I encouraged the patient to continue to monitor her blood pressure closely at home

## 2024-03-06 ENCOUNTER — Other Ambulatory Visit: Payer: Self-pay | Admitting: Hematology and Oncology

## 2024-03-22 ENCOUNTER — Other Ambulatory Visit: Payer: Self-pay | Admitting: Hematology and Oncology

## 2024-03-24 ENCOUNTER — Encounter: Payer: Self-pay | Admitting: Hematology and Oncology

## 2024-03-28 ENCOUNTER — Telehealth: Payer: Self-pay

## 2024-03-28 NOTE — Telephone Encounter (Signed)
 Returned her call. Her bp yesterday was 97/? Today bp 87/? She rechecked bp and it came up to 97/53, pulse 86. She is complaining of being tired.  Per Dr. Lonn, instructed to stop Avapro  and take 1/2 tablet of Atenolol . Increase oral fluids to 64 oz per day and track. She verbalized understanding. She will check bp BID and call the office back Monday with update.

## 2024-03-29 ENCOUNTER — Other Ambulatory Visit: Payer: Self-pay

## 2024-03-29 ENCOUNTER — Inpatient Hospital Stay
Admission: EM | Admit: 2024-03-29 | Discharge: 2024-04-01 | DRG: 375 | Disposition: A | Discharge status: Home / Self Care | Attending: Hospitalist | Admitting: Hospitalist

## 2024-03-29 ENCOUNTER — Emergency Department

## 2024-03-29 ENCOUNTER — Inpatient Hospital Stay

## 2024-03-29 ENCOUNTER — Encounter: Payer: Self-pay | Admitting: Family Medicine

## 2024-03-29 DIAGNOSIS — K449 Diaphragmatic hernia without obstruction or gangrene: Secondary | ICD-10-CM | POA: Diagnosis present

## 2024-03-29 DIAGNOSIS — K5900 Constipation, unspecified: Secondary | ICD-10-CM | POA: Diagnosis present

## 2024-03-29 DIAGNOSIS — C7989 Secondary malignant neoplasm of other specified sites: Secondary | ICD-10-CM | POA: Diagnosis present

## 2024-03-29 DIAGNOSIS — K3189 Other diseases of stomach and duodenum: Secondary | ICD-10-CM | POA: Diagnosis present

## 2024-03-29 DIAGNOSIS — Z87442 Personal history of urinary calculi: Secondary | ICD-10-CM

## 2024-03-29 DIAGNOSIS — Z8543 Personal history of malignant neoplasm of ovary: Secondary | ICD-10-CM

## 2024-03-29 DIAGNOSIS — Z803 Family history of malignant neoplasm of breast: Secondary | ICD-10-CM

## 2024-03-29 DIAGNOSIS — K922 Gastrointestinal hemorrhage, unspecified: Secondary | ICD-10-CM | POA: Diagnosis present

## 2024-03-29 DIAGNOSIS — I10 Essential (primary) hypertension: Secondary | ICD-10-CM | POA: Diagnosis present

## 2024-03-29 DIAGNOSIS — M069 Rheumatoid arthritis, unspecified: Secondary | ICD-10-CM | POA: Diagnosis present

## 2024-03-29 DIAGNOSIS — K921 Melena: Secondary | ICD-10-CM | POA: Diagnosis present

## 2024-03-29 DIAGNOSIS — Z9221 Personal history of antineoplastic chemotherapy: Secondary | ICD-10-CM

## 2024-03-29 DIAGNOSIS — K432 Incisional hernia without obstruction or gangrene: Secondary | ICD-10-CM | POA: Diagnosis present

## 2024-03-29 DIAGNOSIS — Z800 Family history of malignant neoplasm of digestive organs: Secondary | ICD-10-CM

## 2024-03-29 DIAGNOSIS — G893 Neoplasm related pain (acute) (chronic): Secondary | ICD-10-CM | POA: Diagnosis present

## 2024-03-29 DIAGNOSIS — F909 Attention-deficit hyperactivity disorder, unspecified type: Secondary | ICD-10-CM | POA: Diagnosis present

## 2024-03-29 DIAGNOSIS — Z981 Arthrodesis status: Secondary | ICD-10-CM

## 2024-03-29 DIAGNOSIS — C563 Malignant neoplasm of bilateral ovaries: Secondary | ICD-10-CM | POA: Diagnosis present

## 2024-03-29 DIAGNOSIS — Z90710 Acquired absence of both cervix and uterus: Secondary | ICD-10-CM

## 2024-03-29 DIAGNOSIS — Z820 Family history of epilepsy and other diseases of the nervous system: Secondary | ICD-10-CM

## 2024-03-29 DIAGNOSIS — Z79899 Other long term (current) drug therapy: Secondary | ICD-10-CM

## 2024-03-29 DIAGNOSIS — C170 Malignant neoplasm of duodenum: Secondary | ICD-10-CM | POA: Diagnosis present

## 2024-03-29 DIAGNOSIS — F32A Depression, unspecified: Secondary | ICD-10-CM | POA: Diagnosis present

## 2024-03-29 DIAGNOSIS — Z87891 Personal history of nicotine dependence: Secondary | ICD-10-CM

## 2024-03-29 DIAGNOSIS — Z806 Family history of leukemia: Secondary | ICD-10-CM

## 2024-03-29 DIAGNOSIS — Z885 Allergy status to narcotic agent status: Secondary | ICD-10-CM

## 2024-03-29 DIAGNOSIS — D630 Anemia in neoplastic disease: Secondary | ICD-10-CM | POA: Diagnosis present

## 2024-03-29 DIAGNOSIS — J4489 Other specified chronic obstructive pulmonary disease: Secondary | ICD-10-CM | POA: Diagnosis present

## 2024-03-29 DIAGNOSIS — Z8249 Family history of ischemic heart disease and other diseases of the circulatory system: Secondary | ICD-10-CM

## 2024-03-29 DIAGNOSIS — F419 Anxiety disorder, unspecified: Secondary | ICD-10-CM | POA: Diagnosis present

## 2024-03-29 DIAGNOSIS — K219 Gastro-esophageal reflux disease without esophagitis: Secondary | ICD-10-CM | POA: Diagnosis present

## 2024-03-29 DIAGNOSIS — C784 Secondary malignant neoplasm of small intestine: Principal | ICD-10-CM | POA: Diagnosis present

## 2024-03-29 DIAGNOSIS — G35D Multiple sclerosis, unspecified: Secondary | ICD-10-CM | POA: Diagnosis present

## 2024-03-29 DIAGNOSIS — Z888 Allergy status to other drugs, medicaments and biological substances status: Secondary | ICD-10-CM

## 2024-03-29 LAB — BASIC METABOLIC PANEL WITH GFR
Anion gap: 9 (ref 5–15)
BUN: 19 mg/dL (ref 8–23)
CO2: 24 mmol/L (ref 22–32)
Calcium: 8.7 mg/dL — ABNORMAL LOW (ref 8.9–10.3)
Chloride: 103 mmol/L (ref 98–111)
Creatinine, Ser: 0.86 mg/dL (ref 0.44–1.00)
GFR, Estimated: >60 mL/min (ref >60)
Glucose, Bld: 105 mg/dL — ABNORMAL HIGH (ref 70–99)
Potassium: 3.7 mmol/L (ref 3.5–5.1)
Sodium: 136 mmol/L (ref 135–145)

## 2024-03-29 LAB — HEMOGLOBIN AND HEMATOCRIT, BLOOD
HCT: 20.6 % — ABNORMAL LOW (ref 36.0–46.0)
Hemoglobin: 6.9 g/dL — ABNORMAL LOW (ref 12.0–15.0)

## 2024-03-29 LAB — CBC
HCT: 18.4 % — ABNORMAL LOW (ref 36.0–46.0)
Hemoglobin: 5.8 g/dL — ABNORMAL LOW (ref 12.0–15.0)
MCH: 33.1 pg (ref 26.0–34.0)
MCHC: 31.5 g/dL (ref 30.0–36.0)
MCV: 105.1 fL — ABNORMAL HIGH (ref 80.0–100.0)
Platelets: 265 K/uL (ref 150–400)
RBC: 1.75 MIL/uL — ABNORMAL LOW (ref 3.87–5.11)
RDW: 16.3 % — ABNORMAL HIGH (ref 11.5–15.5)
WBC: 5.7 K/uL (ref 4.0–10.5)
nRBC: 0.4 % — ABNORMAL HIGH (ref 0.0–0.2)

## 2024-03-29 LAB — TROPONIN T, HIGH SENSITIVITY: Troponin T High Sensitivity: <15 ng/L (ref 0–19)

## 2024-03-29 LAB — PREPARE RBC (CROSSMATCH)

## 2024-03-29 MED ORDER — MORPHINE SULFATE (PF) 2 MG/ML IV SOLN
2.0000 mg | INTRAVENOUS | Status: DC | PRN
  Administered 2024-03-29 – 2024-04-01 (×3): 2 mg via INTRAVENOUS
  Filled 2024-03-29 (×3): qty 1

## 2024-03-29 MED ORDER — FLUTICASONE FUROATE-VILANTEROL 200-25 MCG/ACT IN AEPB: 1.0000 | INHALATION_SPRAY | Freq: Every day | RESPIRATORY_TRACT | Status: DC

## 2024-03-29 MED ORDER — LORAZEPAM 0.5 MG PO TABS
0.5000 mg | ORAL_TABLET | Freq: Three times a day (TID) | ORAL | Status: DC | PRN
  Administered 2024-03-29 – 2024-03-31 (×3): 0.5 mg via ORAL
  Filled 2024-03-29 (×3): qty 1

## 2024-03-29 MED ORDER — ALBUTEROL SULFATE (2.5 MG/3ML) 0.083% IN NEBU: 2.5000 mg | INHALATION_SOLUTION | Freq: Four times a day (QID) | RESPIRATORY_TRACT | Status: DC | PRN

## 2024-03-29 MED ORDER — IOHEXOL 350 MG/ML SOLN
100.0000 mL | Freq: Once | INTRAVENOUS | Status: AC | PRN
  Administered 2024-03-29: 100 mL via INTRAVENOUS

## 2024-03-29 MED ORDER — SODIUM CHLORIDE 0.9% IV SOLUTION
Freq: Once | INTRAVENOUS | Status: AC
Start: 2024-03-29 — End: 2024-03-29
  Filled 2024-03-29: qty 250

## 2024-03-29 MED ORDER — ACETAMINOPHEN 325 MG PO TABS
650.0000 mg | ORAL_TABLET | Freq: Four times a day (QID) | ORAL | Status: DC | PRN
Start: 2024-03-29 — End: 2024-04-01

## 2024-03-29 MED ORDER — FLUTICASONE-SALMETEROL 250-50 MCG/ACT IN AEPB
1.0000 | INHALATION_SPRAY | Freq: Two times a day (BID) | RESPIRATORY_TRACT | Status: DC
  Administered 2024-03-30 – 2024-04-01 (×3): 1 via RESPIRATORY_TRACT

## 2024-03-29 MED ORDER — OXYCODONE HCL 5 MG PO TABS
20.0000 mg | ORAL_TABLET | Freq: Every day | ORAL | Status: DC | PRN
  Administered 2024-03-29 – 2024-04-01 (×8): 20 mg via ORAL
  Filled 2024-03-29 (×8): qty 4

## 2024-03-29 MED ORDER — ACETAMINOPHEN 650 MG RE SUPP: 650.0000 mg | Freq: Four times a day (QID) | RECTAL | Status: DC | PRN

## 2024-03-29 MED ORDER — OXYCODONE HCL ER 15 MG PO T12A
15.0000 mg | EXTENDED_RELEASE_TABLET | Freq: Three times a day (TID) | ORAL | Status: DC
  Administered 2024-03-29 – 2024-04-01 (×8): 15 mg via ORAL
  Filled 2024-03-29 (×8): qty 1

## 2024-03-29 MED ORDER — PANTOPRAZOLE SODIUM 40 MG IV SOLR
40.0000 mg | Freq: Two times a day (BID) | INTRAVENOUS | Status: DC
  Administered 2024-03-30 – 2024-04-01 (×6): 40 mg via INTRAVENOUS
  Filled 2024-03-29 (×6): qty 10

## 2024-03-29 MED ORDER — BACLOFEN 10 MG PO TABS
10.0000 mg | ORAL_TABLET | Freq: Every evening | ORAL | Status: DC | PRN
  Administered 2024-03-29 – 2024-03-30 (×2): 10 mg via ORAL
  Filled 2024-03-29 (×2): qty 1

## 2024-03-29 MED ORDER — PANTOPRAZOLE SODIUM 40 MG IV SOLR
40.0000 mg | INTRAVENOUS | Status: AC
Start: 2024-03-29 — End: 2024-03-29
  Administered 2024-03-29: 40 mg via INTRAVENOUS
  Filled 2024-03-29: qty 10

## 2024-03-29 NOTE — ED Notes (Signed)
 This RN called pharmacy to see why there is a delay in medications. Pharmacy states they will have a med rec come to verify meds.

## 2024-03-29 NOTE — ED Notes (Signed)
 NURSE HAILEY RN INFORMED OF BED ASSIGNED

## 2024-03-29 NOTE — ED Provider Notes (Signed)
 [facility] Provider Note    Event Date/Time   First MD Initiated Contact with Patient 03/29/24 1329     (approximate)   History   No chief complaint on file.   HPI  [name] is a 68 y.o. female who presents today with concern of lightheadedness and black stool.  Apparently has been dealing with black stool for over 1 month now, and then 3 days ago started having some lightheadedness and increased frequency in the dark stool.  Also complaining of some mild epigastric discomfort that is been ongoing for a few days.  No prior history of GI bleeds. Is not on any blood thinners.  She was getting treated for underlying ovarian cancer, but due to side effects of the chemotherapy, no longer getting this treatment done for the last 4 to 5 months.  Denies other complaints, primarily symptomatic whenever she changes positions or tries to walk.    Physical Exam   Triage Vital Signs: ED Triage Vitals  Encounter Vitals Group     BP 03/29/24 1235 124/88     Girls Systolic BP Percentile --      Girls Diastolic BP Percentile --      Boys Systolic BP Percentile --      Boys Diastolic BP Percentile --      Pulse Rate 03/29/24 1235 98     Resp 03/29/24 1235 16     Temp 03/29/24 1235 98.2 F (36.8 C)     Temp Source 03/29/24 1235 Oral     SpO2 03/29/24 1235 100 %     Weight 03/29/24 1238 165 lb 5.5 oz (75 kg)     Height --      Head Circumference --      Peak Flow --      Pain Score 03/29/24 1237 7     Pain Loc --      Pain Education --      Exclude from Growth Chart --     Most recent vital signs: Vitals:   03/29/24 1325 03/29/24 1327  BP:  (!) 148/99  Pulse:  100  Resp:  19  Temp:  98 F (36.7 C)  SpO2: 100% 100%     General: Awake, no distress.  CV:  Good peripheral perfusion.  Resp:  Normal effort.  Abd:  No distention.  Mild discomfort along the epigastrium, no noted peritoneal findings Other:     ED Results / Procedures / Treatments    Labs (all labs ordered are listed, but only abnormal results are displayed) Labs Reviewed  BASIC METABOLIC PANEL WITH GFR - Abnormal; Notable for the following components:      Result Value   Glucose, Bld 105 (*)    Calcium 8.7 (*)    All other components within normal limits  CBC - Abnormal; Notable for the following components:   RBC 1.75 (*)    Hemoglobin 5.8 (*)    HCT 18.4 (*)    MCV 105.1 (*)    RDW 16.3 (*)    nRBC 0.4 (*)    All other components within normal limits  PREPARE RBC (CROSSMATCH)  TYPE AND SCREEN  TROPONIN T, HIGH SENSITIVITY  TROPONIN T, HIGH SENSITIVITY     EKG  Sinus rhythm with rate of about 100, axis of 80, intervals appear to be within normal limits, no obvious ischemia   RADIOLOGY No acute cardiopulmonary process appreciated  PROCEDURES:  Critical Care performed: Yes, see critical care procedure  note(s)  .Critical Care  Performed by: Fernand Rossie HERO, MD Authorized by: Fernand Rossie HERO, MD   Critical care provider statement:    Critical care time (minutes):  30   Critical care was time spent personally by me on the following activities:  Development of treatment plan with patient or surrogate, discussions with consultants, evaluation of patient's response to treatment, examination of patient, ordering and review of laboratory studies, ordering and review of radiographic studies, ordering and performing treatments and interventions, pulse oximetry, re-evaluation of patient's condition and review of old charts    MEDICATIONS ORDERED IN ED: Medications  0.9 %  sodium chloride  infusion (Manually program via Guardrails IV Fluids) (has no administration in time range)  pantoprazole  (PROTONIX ) injection 40 mg (has no administration in time range)    Followed by  pantoprazole  (PROTONIX ) injection 40 mg (has no administration in time range)     IMPRESSION / MDM / ASSESSMENT AND PLAN / ED COURSE  I reviewed the triage vital signs and the  nursing notes.                               Patient's presentation is most consistent with acute presentation with potential threat to life or bodily function.  68 year old female who presents today with concern of lightheadedness and dizziness.  Seems that she likely has a GI bleed, here with melanotic stool, and a hemoglobin of 5.8.  I obtained consent for blood transfusion, will reach out to medicine team for admission we will initiate workup for GI bleed.   Clinical Course as of 03/29/24 1500  Sat Mar 29, 2024  1429 Spoke with Dr. Perri from medicine team, has agreed to evaluate the patient, requested that we contact gastroenterology to make them aware of patient's presentation. [SK]  1432 Spoke with GI team, aware of the patient and will determine further work up accordingly. [SK]    Clinical Course User Index [SK] Fernand Rossie HERO, MD     FINAL CLINICAL IMPRESSION(S) / ED DIAGNOSES   Final diagnoses:  Gastrointestinal hemorrhage with melena     Rx / DC Orders   ED Discharge Orders     None        Note:  This document was prepared using Dragon voice recognition software and may include unintentional dictation errors.   Fernand Rossie HERO, MD 03/29/24 1501

## 2024-03-29 NOTE — ED Triage Notes (Signed)
 First nurse note: pt to ED [facility] from home for vertigo, generalized weakness x2 days. Pt has ovarian cancer with mets, not getting treatment. Also reports black stools this am.  500mL NS, 4mg  zofran  PTA.

## 2024-03-29 NOTE — ED Triage Notes (Addendum)
 C/O chest pain, dizziness, low blood pressure x 2 days.  Hx of ovarian cancer.  Chemo and Radiation treatments have stopped and patient is just monitoring quality of life'  Patient states x 2 days unable to walk to bathroom due to weakness and SOB.  AAOx3.  Skin warm and dry. NAD

## 2024-03-29 NOTE — H&P (Addendum)
 History and Physical    [name] FMW:[id number] DOB: Sep 06, 1955 DOA: 03/29/2024  PCP: Arloa Elsie SAUNDERS, MD  Patient coming from: home  I have personally briefly reviewed patient's old medical records in [facility]  Chief Complaint: fatigue  HPI: [name] is [name] 68 y.o. 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  She first noticed dark stools about 1 month ago.  Initially thought it was due to her diet (greens and blackberries).  Stool progressively got darker and darker.  Never noticed any bright red blood.  3-4 days ago, she noticed fatigue.  She felt really really tired.  It was hard to walk [name] few steps to the bathroom.  She noticed [name] fast heart rate and low blood pressure (as low as 92 systolic).  She notes epigastric discomfort that she describes as sharp, intermittent, coming and going.  Worse when she eats.  She denies etoh.  No recent NSAIDs.  No OTC pain meds.  No aspirin /goody powders.  Denies smoking.    ED Course: labs, imaging.  2 units pRBC ordered.  Admit to hospitalists for GI bleed.  Consulted GI.   Review of Systems: As per HPI otherwise all other systems reviewed and are negative.  Past Medical History:  Diagnosis Date   Anxiety    Asthma    Back problem    disc disease, chronic low back pain, right leg pain, s/p discectomy 99   Cancer (HCC)    ovarian cancer   Depression    Diastolic dysfunction    with elevated LVEDP at cath   Endometriosis    Fibroids    hx of    GERD (gastroesophageal reflux disease)    Heart murmur    Hepatitis C    treated 8-9 years ago   Hidradenitis suppurativa    History of kidney stones    Hypertension    MS (multiple sclerosis)    Neuromuscular disorder (HCC)    Mulitple sclerosis   Osteoarthritis    Pre-diabetes    Prediabetes    RA (rheumatoid arthritis) (HCC)     Rotator cuff tear    right shoulder    Syncope     Past Surgical History:  Procedure Laterality Date   ANGIOPLASTY     BACK SURGERY     lumbar surgery x 2 done in [place]  and [place]   COLON RESECTION SIGMOID  08/16/2022   Procedure: COLON RESECTION SIGMOID;  Surgeon: Viktoria Comer SAUNDERS, MD;  Location: [facility];  Service: Gynecology;;   COLONOSCOPY  07/2010   tubular adenoma   COLONOSCOPY  08/2015   CYSTOSCOPY N/Pancho Rushing 08/16/2022   Procedure: CYSTOSCOPY;  Surgeon: Viktoria Comer SAUNDERS, MD;  Location: [facility];  Service: Gynecology;  Laterality: N/Blu Mcglaun;   DISKECTOMY     HYSTERECTOMY ABDOMINAL WITH SALPINGO-OOPHORECTOMY  08/16/2022   Procedure: HYSTERECTOMY ABDOMINAL WITH SALPINGO-OOPHORECTOMY;  Surgeon: Viktoria Comer SAUNDERS, MD;  Location: [facility];  Service: Gynecology;;   IR IMAGING GUIDED PORT INSERTION  05/18/2022   IR IMAGING GUIDED PORT INSERTION  07/03/2023   IR REMOVAL TUN ACCESS W/ PORT W/O FL MOD SED  12/20/2022   IR US  GUIDE BX ASP/DRAIN  05/18/2022   LAPAROSCOPY N/Marnette Perkins 08/16/2022   Procedure: LAPAROSCOPY DIAGNOSTIC;  Surgeon: Viktoria Comer SAUNDERS, MD;  Location: [facility];  Service: Gynecology;  Laterality: N/Tallin Hart;   laparoscopy for endometriosis  left shoulder scope     RADIOLOGY WITH ANESTHESIA N/Naseem Adler 05/18/2022   Procedure: IR WITH ANESTHESIA PORT AND BIOPSY;  Surgeon: Luverne Aran, MD;  Location: [facility];  Service: Radiology;  Laterality: N/Kenedie Dirocco;   ROTATOR CUFF REPAIR Left    Spinal Fusion  12/2016   L4-S1    Social History  reports that she quit smoking about 2 years ago. Her smoking use included cigarettes. She started smoking about 29 years ago. She has [place]a 2.7 pack-year smoking history. She has never used smokeless tobacco. She reports current alcohol use. She reports current drug use. Drug: Marijuana.  Allergies  Allergen Reactions   Cymbalta [Duloxetine Hcl] Shortness Of Breath, Swelling and Other (See Comments)    Tongue and leg swelling   Cyclobenzaprine Hcl Other (See Comments)     Immobility   Celexa  [Citalopram Hydrobromide] Swelling and Other (See Comments)    Tongue swelling   Fluticasone  Furoate-Vilanterol Swelling and Other (See Comments)    BREO ELLIPTA - Tongue swelling, but breathing not affected   Gabapentin Other (See Comments)    Feels awful when taking, drowsy    Opana  [Oxymorphone Hcl] Itching and Other (See Comments)    Loss of hair, also   Tramadol Other (See Comments)    Dizziness     Family History  Problem Relation Age of Onset   Heart disease Mother        Died with MI 42, chest pain 30s   Hypertension Mother    Pancreatitis Father    Atrial fibrillation Sister    Breast cancer Maternal Aunt        dx <50   Stomach cancer Maternal Grandfather        mets to liver? dx after 50   Multiple sclerosis Cousin    Breast cancer Cousin        mat female cousin; dx unknown age   Colon cancer Cousin 92       mat female cousin; mets   Leukemia Cousin 56       mat female cousin   Prior to Admission medications   Medication Sig Start Date End Date Taking? Authorizing Provider  albuterol  (VENTOLIN  HFA) 108 (90 Base) MCG/ACT inhaler Inhale 2 puffs into the lungs every 6 (six) hours as needed for shortness of breath. 07/30/21   [provider]  amphetamine -dextroamphetamine  (ADDERALL ) 20 MG tablet Take 10-20 mg by mouth See admin instructions. Take 20 mg by mouth in the morning and 10 mg between 1-2 PM daily    [provider]  Ascorbic Acid (VITAMIN C) 1000 MG tablet Take 1,000 mg by mouth daily.    [provider]  ASHWAGANDHA PO Take 1 capsule by mouth daily.    [provider]  atenolol  (TENORMIN ) 25 MG tablet TAKE 1 TABLET (25 MG TOTAL) BY MOUTH DAILY. 03/06/24   Lonn Hicks, MD  BLACK CURRANT SEED OIL PO Take 1 capsule by mouth daily.    [provider]  diclofenac Sodium (VOLTAREN) 1 % GEL Apply 2 g topically 2 (two) times daily. 08/12/19   [provider]  lidocaine -prilocaine  (EMLA ) cream  Apply to affected area once 06/25/23   Lonn Hicks, MD  LORazepam  (ATIVAN ) 0.5 MG tablet Take 0.5 mg by mouth 3 (three) times daily as needed for anxiety.    [provider]  Melatonin 10 MG TABS Take 10 mg by mouth at bedtime as needed (sleep).    [provider]  Multiple Vitamin (MULTIVITAMIN WITH  MINERALS) TABS tablet Take 1 tablet by mouth daily.    [provider]  mupirocin ointment (BACTROBAN) 2 % Apply 1 Application topically 3 (three) times daily as needed (HS flares). 08/02/21   [provider]  naloxone  (NARCAN ) nasal spray 4 mg/0.1 mL Place 1 spray into the nose as needed (opioid overdose).    [provider]  omeprazole  (PRILOSEC) 20 MG capsule TAKE 1 CAPSULE BY MOUTH EVERY DAY 03/24/24   Lonn Hicks, MD  ondansetron  (ZOFRAN ) 8 MG tablet Take 1 tablet (8 mg total) by mouth every 8 (eight) hours as needed for nausea or vomiting. 02/29/24   Lonn Hicks, MD  OVER THE COUNTER MEDICATION Take 2 capsules by mouth daily. [place] advanced supplement    [provider]  oxyCODONE  ER (XTAMPZA  ER) 13.5 MG C12A Take 13.5 mg by mouth in the morning, at noon, and at bedtime.    [provider]  Oxycodone  HCl 20 MG TABS Take 20 mg by mouth 5 (five) times daily as needed (pain).    [provider]  polyethylene glycol (MIRALAX  / GLYCOLAX ) 17 g packet Take 17 g by mouth daily.    [provider]  prochlorperazine  (COMPAZINE ) 10 MG tablet Take 1 tablet (10 mg total) by mouth every 6 (six) hours as needed for nausea or vomiting. 02/29/24   Lonn Hicks, MD  Vitamin D, Ergocalciferol, (DRISDOL) 1.25 MG (50000 UNIT) CAPS capsule Take 50,000 Units by mouth every Sunday.    [provider]  NAPOLEON INHUB 250-50 MCG/ACT AEPB Inhale 1 puff into the lungs daily.    [provider]    Physical Exam: Vitals:   03/29/24 1235 03/29/24 1238 03/29/24 1325 03/29/24 1327  BP: 124/88   (!) 148/99  Pulse: 98   100  Resp:  16   19  Temp: 98.2 F (36.8 C)   98 F (36.7 C)  TempSrc: Oral   Oral  SpO2: 100%  100% 100%  Weight:  75 kg      Constitutional: NAD, calm, comfortable Vitals:   03/29/24 1235 03/29/24 1238 03/29/24 1325 03/29/24 1327  BP: 124/88   (!) 148/99  Pulse: 98   100  Resp: 16   19  Temp: 98.2 F (36.8 C)   98 F (36.7 C)  TempSrc: Oral   Oral  SpO2: 100%  100% 100%  Weight:  75 kg     Eyes: PERRL, lids and conjunctivae normal ENMT: Mucous membranes are moist Neck: normal, supple Respiratory: unlabored, CTAB Cardiovascular: RRR  Abdomen: epigastric and R and L lower quadrant TTP  Musculoskeletal: no clubbing / cyanosis. No joint deformity upper and lower extremities. Good ROM, no contractures. Normal muscle tone.  Skin: no rashes, lesions, ulcers. No induration Neurologic: CN 2-12 grossly intact. Moving all extremities Psychiatric: Normal judgment and insight. Alert and oriented x 3. Normal mood.   Labs on Admission: I have personally reviewed following labs and imaging studies  CBC: Recent Labs  Lab 03/29/24 1240  WBC 5.7  HGB 5.8*  HCT 18.4*  MCV 105.1*  PLT 265    Basic Metabolic Panel: Recent Labs  Lab 03/29/24 1240  NA 136  K 3.7  CL 103  CO2 24  GLUCOSE 105*  BUN 19  CREATININE 0.86  CALCIUM 8.7*    GFR: Estimated Creatinine Clearance: 65.8 mL/min (by C-G formula based on SCr of 0.86 mg/dL).  Liver Function Tests: No results for input(s): AST, ALT, ALKPHOS, BILITOT, PROT, ALBUMIN  in the last  168 hours.  Urine analysis:    Component Value Date/Time   PROTEINUR NEGATIVE 08/17/2023 0932    Radiological Exams on Admission: DG Chest 2 View Result Date: 03/29/2024 CLINICAL DATA:  Chest pain. EXAM: CHEST - 2 VIEW COMPARISON:  Chest CT dated 09/19/2022. FINDINGS: Right-sided [place]-Willard Madrigal-Cath with tip over central SVC. No focal consolidation, pleural effusion, pneumothorax. The cardiac silhouette is within normal limits. No acute osseous  pathology. Degenerative changes of the shoulders. IMPRESSION: No active cardiopulmonary disease. Electronically Signed   By: Vanetta Chou M.D.   On: 03/29/2024 13:01    EKG: Independently reviewed. Repeat EKG requested  Assessment/Plan Principal Problem:   GI bleed    Assessment and Plan:  Melena Suspected Upper GI Bleed Symptomatic Anemia Follow CT angio GI bleed (note mass b/n pancreatic head/distal stomach on recent CT scan) Hb down from 12.9 (02/2024) to 5.8 today 2 units pRBC ordered by EDP Maintain 2 PIV Trend Hb Clears for now, npo after midnight PPI BID GI consult, appreciate recommendation - they're planning to reevaluate in AM before adding her to schedule   Metastatic Ovarian Cancer Cancer Related Pain CT 02/22/2024 with interval increase in seizre of heterogenous enlarged LN/mass between pancreatic head and dsital stomach, interval increase in size of centrally necrotic pericardiophrenic LN, R external iliac LN's similar to slightly increased in size.  Asymmetric nodular soft tissue on R side of vaginal cuff. Follows with Dr. Lonn outpatient, at this time, not pursuing additional treatment for her cancer (see 02/29/2024 note from oncology)  Hypertension Hold atenolol  for now until transfusion completed  ADHD Hold adderall , may consider discontinuing long term as she notes occasional palpitations related to when she takes this  COPD Continue home inhalers, pharmacy substitute as needed  Cramping Takes baclofen  nightly prn   RA Plaquenil - this was not in her med list that we reviewed - follow final med rec  Hx MS Not on meds  PDMP reviewed, filled xtampza  and oxycodone  on 11/3 for 30 day supplies (also filled adderall  at that time)   DVT prophylaxis: SCD  Code Status:   full  Family Communication:  Sister at bedside  Disposition Plan:   Patient is from:  home  Anticipated DC to:  home  Anticipated DC date:  Pending improvement, GI  eval  Anticipated DC barriers: Pending GI eval, improvement in Hb  Consults called:  GI  Admission status:  inpatient   Severity of Illness: The appropriate patient status for this patient is INPATIENT. Inpatient status is judged to be reasonable and necessary in order to provide the required intensity of service to ensure the patient's safety. The patient's presenting symptoms, physical exam findings, and initial radiographic and laboratory data in the context of their chronic comorbidities is felt to place them at high risk for further clinical deterioration. Furthermore, it is not anticipated that the patient will be medically stable for discharge from the hospital within 2 midnights of admission.   * I certify that at the point of admission it is my clinical judgment that the patient will require inpatient hospital care spanning beyond 2 midnights from the point of admission due to high intensity of service, high risk for further deterioration and high frequency of surveillance required.DEWAINE Meliton Monte MD [facility]  How to contact the [facility] Attending or Consulting provider 7A - 7P or covering provider during after hours 7P -7A, for this patient?   Check the care team in [facility] and look for Brinna Divelbiss) attending/consulting  [facility] provider listed and b) the [facility] team listed Log into [email or web address] and use [facility] universal password to access. If you do not have the password, please contact the hospital operator. Locate the [facility] provider you are looking for under [facility] and page to Zimir Kittleson number that you can be directly reached. If you still have difficulty reaching the provider, please page the [facility] (Director on Call) for the Hospitalists listed on amion for assistance.  03/29/2024, 3:44 PM

## 2024-03-29 NOTE — Consult Note (Signed)
 Consultation  Referring Provider:   ED   Admit date: 03/29/2024 Consult date: 03/29/2024         Reason for Consultation: Melena              HPI:   [name] is a 68 y.o. 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 She had chemotherapy but stopped a few months ago due to side effects. She presents with melenic stool for a few weeks and fatigue. She does not take any NSAIDS or blood thinners. Her hemoglobin in the ED was 5.9. She had imaging done a few months ago with large lymph node/mass at the pancreatic head with extension to the distal stomach. Endorses nausea but no hematemesis.   Past Medical History:  Diagnosis Date   Anxiety    Asthma    Back problem    disc disease, chronic low back pain, right leg pain, s/p discectomy 99   Cancer (HCC)    ovarian cancer   Depression    Diastolic dysfunction    with elevated LVEDP at cath   Endometriosis    Fibroids    hx of    GERD (gastroesophageal reflux disease)    Heart murmur    Hepatitis C    treated 8-9 years ago   Hidradenitis suppurativa    History of kidney stones    Hypertension    MS (multiple sclerosis)    Neuromuscular disorder (HCC)    Mulitple sclerosis   Osteoarthritis    Pre-diabetes    Prediabetes    RA (rheumatoid arthritis) (HCC)    Rotator cuff tear    right shoulder    Syncope     Past Surgical History:  Procedure Laterality Date   ANGIOPLASTY     BACK SURGERY     lumbar surgery x 2 done in [place]  and [place]   COLON RESECTION SIGMOID  08/16/2022   Procedure: COLON RESECTION SIGMOID;  Surgeon: Viktoria Comer SAUNDERS, MD;  Location: [facility];  Service: Gynecology;;   COLONOSCOPY  07/2010   tubular adenoma   COLONOSCOPY  08/2015   CYSTOSCOPY N/A 08/16/2022   Procedure: CYSTOSCOPY;  Surgeon: Viktoria Comer SAUNDERS, MD;  Location: [facility];  Service:  Gynecology;  Laterality: N/A;   DISKECTOMY     HYSTERECTOMY ABDOMINAL WITH SALPINGO-OOPHORECTOMY  08/16/2022   Procedure: HYSTERECTOMY ABDOMINAL WITH SALPINGO-OOPHORECTOMY;  Surgeon: Viktoria Comer SAUNDERS, MD;  Location: [facility];  Service: Gynecology;;   IR IMAGING GUIDED PORT INSERTION  05/18/2022   IR IMAGING GUIDED PORT INSERTION  07/03/2023   IR REMOVAL TUN ACCESS W/ PORT W/O FL MOD SED  12/20/2022   IR US  GUIDE BX ASP/DRAIN  05/18/2022   LAPAROSCOPY N/A 08/16/2022   Procedure: LAPAROSCOPY DIAGNOSTIC;  Surgeon: Viktoria Comer SAUNDERS, MD;  Location: [facility];  Service: Gynecology;  Laterality: N/A;   laparoscopy for endometriosis     left shoulder scope     RADIOLOGY WITH ANESTHESIA N/A 05/18/2022   Procedure: IR WITH ANESTHESIA PORT AND BIOPSY;  Surgeon: Luverne Aran, MD;  Location: [facility];  Service: Radiology;  Laterality: N/A;   ROTATOR CUFF REPAIR Left    Spinal Fusion  12/2016   L4-S1    Family History  Problem Relation Age of Onset   Heart disease Mother        Died with MI 37, chest pain 30s   Hypertension Mother  Pancreatitis Father    Atrial fibrillation Sister    Breast cancer Maternal Aunt        dx <50   Stomach cancer Maternal Grandfather        mets to liver? dx after 50   Multiple sclerosis Cousin    Breast cancer Cousin        mat female cousin; dx unknown age   Colon cancer Cousin 10       mat female cousin; mets   Leukemia Cousin 68       mat female cousin    Social History   Tobacco Use   Smoking status: Former    Current packs/day: 0.00    Average packs/day: 0.1 packs/day for 27.0 years (2.7 ttl pk-yrs)    Types: Cigarettes    Start date: 71    Quit date: 2023    Years since quitting: 2.8   Smokeless tobacco: Never  Vaping Use   Vaping status: Never Used  Substance Use Topics   Alcohol use: Yes    Comment: Occasional    Drug use: Yes    Types: Marijuana    Prior to Admission medications   Medication Sig Start Date End Date Taking? Authorizing  Provider  albuterol  (VENTOLIN  HFA) 108 (90 Base) MCG/ACT inhaler Inhale 2 puffs into the lungs every 6 (six) hours as needed for shortness of breath. 07/30/21   [provider]  amphetamine -dextroamphetamine  (ADDERALL ) 20 MG tablet Take 10-20 mg by mouth See admin instructions. Take 20 mg by mouth in the morning and 10 mg between 1-2 PM daily    [provider]  Ascorbic Acid (VITAMIN C) 1000 MG tablet Take 1,000 mg by mouth daily.    [provider]  ASHWAGANDHA PO Take 1 capsule by mouth daily.    [provider]  atenolol  (TENORMIN ) 25 MG tablet TAKE 1 TABLET (25 MG TOTAL) BY MOUTH DAILY. 03/06/24   Lonn Hicks, MD  BLACK CURRANT SEED OIL PO Take 1 capsule by mouth daily.    [provider]  diclofenac Sodium (VOLTAREN) 1 % GEL Apply 2 g topically 2 (two) times daily. 08/12/19   [provider]  lidocaine -prilocaine  (EMLA ) cream Apply to affected area once 06/25/23   Lonn Hicks, MD  LORazepam  (ATIVAN ) 0.5 MG tablet Take 0.5 mg by mouth 3 (three) times daily as needed for anxiety.    [provider]  Melatonin 10 MG TABS Take 10 mg by mouth at bedtime as needed (sleep).    [provider]  Multiple Vitamin (MULTIVITAMIN WITH MINERALS) TABS tablet Take 1 tablet by mouth daily.    [provider]  mupirocin ointment (BACTROBAN) 2 % Apply 1 Application topically 3 (three) times daily as needed (HS flares). 08/02/21   [provider]  naloxone  (NARCAN ) nasal spray 4 mg/0.1 mL Place 1 spray into the nose as needed (opioid overdose).    [provider]  omeprazole  (PRILOSEC) 20 MG capsule TAKE 1 CAPSULE BY MOUTH EVERY DAY 03/24/24   Lonn Hicks, MD  ondansetron  (ZOFRAN ) 8 MG tablet Take 1 tablet (8 mg total) by mouth every 8 (eight) hours as needed for nausea or vomiting. 02/29/24   Lonn Hicks, MD  OVER THE COUNTER MEDICATION Take 2 capsules by mouth daily. Sea Moss advanced supplement    [provider]  oxyCODONE  ER (XTAMPZA  ER) 13.5 MG C12A Take 13.5 mg by mouth in the morning, at noon, and at bedtime.    [provider]  Oxycodone  HCl 20 MG TABS Take 20 mg by mouth 5 (five) times daily as needed (pain).    [provider]  polyethylene glycol (MIRALAX  / GLYCOLAX ) 17 g packet Take 17 g by mouth daily.    [provider]  prochlorperazine  (COMPAZINE ) 10 MG tablet Take 1 tablet (10 mg total) by mouth every 6 (six) hours as needed for nausea or vomiting. 02/29/24   Lonn Hicks, MD  Vitamin D, Ergocalciferol, (DRISDOL) 1.25 MG (50000 UNIT) CAPS capsule Take 50,000 Units by mouth every Sunday.    [provider]  NAPOLEON INHUB 250-50 MCG/ACT AEPB Inhale 1 puff into the lungs daily.    [provider]    Current Facility-Administered Medications  Medication Dose Route Frequency Provider Last Rate Last Admin   0.9 %  sodium chloride  infusion (Manually program via Guardrails IV Fluids)   Intravenous Once Fernand Rossie HERO, MD       [START ON 03/30/2024] pantoprazole  (PROTONIX ) injection 40 mg  40 mg Intravenous Q12H Fernand Rossie HERO, MD       Current Outpatient Medications  Medication Sig Dispense Refill   albuterol  (VENTOLIN  HFA) 108 (90 Base) MCG/ACT inhaler Inhale 2 puffs into the lungs every 6 (six) hours as needed for shortness of breath.     amphetamine -dextroamphetamine  (ADDERALL ) 20 MG tablet Take 10-20 mg by mouth See admin instructions. Take 20 mg by mouth in the morning and 10 mg between 1-2 PM daily     Ascorbic Acid (VITAMIN C) 1000 MG tablet Take 1,000 mg by mouth daily.     ASHWAGANDHA PO Take 1 capsule by mouth daily.     atenolol  (TENORMIN ) 25 MG tablet TAKE 1 TABLET (25 MG TOTAL) BY MOUTH DAILY. 90 tablet 1   BLACK CURRANT SEED OIL PO Take 1 capsule by mouth daily.     diclofenac Sodium (VOLTAREN) 1 % GEL Apply 2 g topically 2 (two) times daily.     lidocaine -prilocaine  (EMLA ) cream Apply to affected area once 30 g 3   LORazepam   (ATIVAN ) 0.5 MG tablet Take 0.5 mg by mouth 3 (three) times daily as needed for anxiety.     Melatonin 10 MG TABS Take 10 mg by mouth at bedtime as needed (sleep).     Multiple Vitamin (MULTIVITAMIN WITH MINERALS) TABS tablet Take 1 tablet by mouth daily.     mupirocin ointment (BACTROBAN) 2 % Apply 1 Application topically 3 (three) times daily as needed (HS flares).     naloxone  (NARCAN ) nasal spray 4 mg/0.1 mL Place 1 spray into the nose as needed (opioid overdose).     omeprazole  (PRILOSEC) 20 MG capsule TAKE 1 CAPSULE BY MOUTH EVERY DAY 90 capsule 1   ondansetron  (ZOFRAN ) 8 MG tablet Take 1 tablet (8 mg total) by mouth every 8 (eight) hours as needed for nausea or vomiting. 30 tablet 1   OVER THE COUNTER MEDICATION Take 2 capsules by mouth daily. Sea Moss advanced supplement     oxyCODONE  ER (XTAMPZA  ER) 13.5 MG C12A Take 13.5 mg by mouth in the morning, at noon, and at bedtime.     Oxycodone  HCl 20 MG TABS Take 20 mg by mouth 5 (five) times daily as needed (pain).     polyethylene glycol (MIRALAX  / GLYCOLAX ) 17 g packet Take 17 g by mouth daily.     prochlorperazine  (COMPAZINE ) 10 MG tablet Take 1 tablet (10 mg total) by mouth every 6 (six) hours as needed for nausea or vomiting. 30 tablet 1  Vitamin D, Ergocalciferol, (DRISDOL) 1.25 MG (50000 UNIT) CAPS capsule Take 50,000 Units by mouth every Sunday.     WIXELA INHUB 250-50 MCG/ACT AEPB Inhale 1 puff into the lungs daily.      Allergies as of 03/29/2024 - Review Complete 03/29/2024  Allergen Reaction Noted   Cymbalta [duloxetine hcl] Shortness Of Breath, Swelling, and Other (See Comments) 08/12/2014   Cyclobenzaprine hcl Other (See Comments)    Celexa  [citalopram hydrobromide] Swelling and Other (See Comments) 12/13/2017   Fluticasone  furoate-vilanterol Swelling and Other (See Comments) 09/09/2015   Gabapentin Other (See Comments) 06/02/2020   Opana  [oxymorphone hcl] Itching and Other (See Comments) 12/13/2017   Tramadol Other (See  Comments) 06/02/2020     Review of Systems:    All systems reviewed and negative except where noted in HPI.  Review of Systems  Constitutional:  Negative for chills and fever.  Respiratory:  Negative for shortness of breath.   Cardiovascular:  Negative for chest pain.  Gastrointestinal:  Positive for melena.  Musculoskeletal:  Negative for joint pain.  Skin:  Negative for rash.  Neurological:  Negative for focal weakness.  Psychiatric/Behavioral:  Negative for substance abuse.   All other systems reviewed and are negative.     Physical Exam:  Vital signs in last 24 hours: Temp:  [98 F (36.7 C)-98.2 F (36.8 C)] 98 F (36.7 C) (11/22 1327) Pulse Rate:  [98-100] 100 (11/22 1327) Resp:  [16-19] 19 (11/22 1327) BP: (124-148)/(88-99) 148/99 (11/22 1327) SpO2:  [100 %] 100 % (11/22 1327) Weight:  [75 kg] 75 kg (11/22 1238)   General:   Pleasant in NAD Head:  Normocephalic and atraumatic. Eyes:   No icterus.   Conjunctiva pink. Mouth: Mucosa pink moist, no lesions. Neck:  Supple; no masses felt Lungs: No respiratory distress Abdomen:   Flat, soft, nondistended, nontender Rectal:  Not performed.  Msk:  No clubbing or cyanosis. Strength 5/5. Symmetrical without gross deformities. Neurologic:  Alert and  oriented x4 Skin:  Warm, dry, pink without significant lesions or rashes. Psych:  Alert and cooperative. Normal affect.  LAB RESULTS: Recent Labs    03/29/24 1240  WBC 5.7  HGB 5.8*  HCT 18.4*  PLT 265   BMET Recent Labs    03/29/24 1240  NA 136  K 3.7  CL 103  CO2 24  GLUCOSE 105*  BUN 19  CREATININE 0.86  CALCIUM 8.7*   LFT No results for input(s): PROT, ALBUMIN , AST, ALT, ALKPHOS, BILITOT, BILIDIR, IBILI in the last 72 hours. PT/INR No results for input(s): LABPROT, INR in the last 72 hours.  STUDIES: DG Chest 2 View Result Date: 03/29/2024 CLINICAL DATA:  Chest pain. EXAM: CHEST - 2 VIEW COMPARISON:  Chest CT dated 09/19/2022.  FINDINGS: Right-sided Port-A-Cath with tip over central SVC. No focal consolidation, pleural effusion, pneumothorax. The cardiac silhouette is within normal limits. No acute osseous pathology. Degenerative changes of the shoulders. IMPRESSION: No active cardiopulmonary disease. Electronically Signed   By: Vanetta Chou M.D.   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 I'm somewhat concerned about potential erosion of mass into the stomach based on imaging in October. Regardless would benefit from EGD.  - clear liquids - NPO at midnight - PPI IV BID - transfuse to keep hemoglobin >  7 - trend CBC's - could consider repeat imaging to better assess mass - will re-evaluate tomorrow morning before adding her to the schedule  Frederic Schick MD, MPH [facility]

## 2024-03-29 NOTE — ED Notes (Signed)
hgb 5.8

## 2024-03-30 ENCOUNTER — Inpatient Hospital Stay: Admitting: Anesthesiology

## 2024-03-30 ENCOUNTER — Encounter
Admission: EM | Disposition: A | Discharge status: Home / Self Care | Payer: Self-pay | Source: Home / Self Care | Attending: Hospitalist

## 2024-03-30 DIAGNOSIS — K921 Melena: Secondary | ICD-10-CM | POA: Diagnosis not present

## 2024-03-30 HISTORY — PX: ESOPHAGOGASTRODUODENOSCOPY: SHX5428

## 2024-03-30 LAB — FOLATE: Folate: 14.4 ng/mL (ref >5.9)

## 2024-03-30 LAB — CBC
HCT: 25.2 % — ABNORMAL LOW (ref 36.0–46.0)
Hemoglobin: 8.4 g/dL — ABNORMAL LOW (ref 12.0–15.0)
MCH: 31.6 pg (ref 26.0–34.0)
MCHC: 33.3 g/dL (ref 30.0–36.0)
MCV: 94.7 fL (ref 80.0–100.0)
Platelets: 224 K/uL (ref 150–400)
RBC: 2.66 MIL/uL — ABNORMAL LOW (ref 3.87–5.11)
RDW: 18.6 % — ABNORMAL HIGH (ref 11.5–15.5)
WBC: 5.5 K/uL (ref 4.0–10.5)
nRBC: 0 % (ref 0.0–0.2)

## 2024-03-30 LAB — IRON AND TIBC
Iron: 36 ug/dL (ref 28–170)
Saturation Ratios: 11 % (ref 10.4–31.8)
TIBC: 328 ug/dL (ref 250–450)
UIBC: 291 ug/dL

## 2024-03-30 LAB — COMPREHENSIVE METABOLIC PANEL WITH GFR
ALT: 15 U/L (ref 0–44)
AST: 23 U/L (ref 15–41)
Albumin: 3.6 g/dL (ref 3.5–5.0)
Alkaline Phosphatase: 50 U/L (ref 38–126)
Anion gap: 8 (ref 5–15)
BUN: 10 mg/dL (ref 8–23)
CO2: 26 mmol/L (ref 22–32)
Calcium: 8.7 mg/dL — ABNORMAL LOW (ref 8.9–10.3)
Chloride: 104 mmol/L (ref 98–111)
Creatinine, Ser: 0.78 mg/dL (ref 0.44–1.00)
GFR, Estimated: >60 mL/min (ref >60)
Glucose, Bld: 128 mg/dL — ABNORMAL HIGH (ref 70–99)
Potassium: 3.8 mmol/L (ref 3.5–5.1)
Sodium: 138 mmol/L (ref 135–145)
Total Bilirubin: 0.3 mg/dL (ref 0.0–1.2)
Total Protein: 5.8 g/dL — ABNORMAL LOW (ref 6.5–8.1)

## 2024-03-30 LAB — HIV ANTIBODY (ROUTINE TESTING W REFLEX): HIV Screen 4th Generation wRfx: NONREACTIVE

## 2024-03-30 LAB — VITAMIN B12: Vitamin B-12: 714 pg/mL (ref 180–914)

## 2024-03-30 LAB — TROPONIN T, HIGH SENSITIVITY: Troponin T High Sensitivity: <15 ng/L (ref 0–19)

## 2024-03-30 LAB — FERRITIN: Ferritin: 86 ng/mL (ref 11–307)

## 2024-03-30 SURGERY — EGD (ESOPHAGOGASTRODUODENOSCOPY)
Anesthesia: General

## 2024-03-30 MED ORDER — AMPHETAMINE-DEXTROAMPHETAMINE 10 MG PO TABS: 10.0000 mg | ORAL_TABLET | Freq: Every day | ORAL | Status: DC

## 2024-03-30 MED ORDER — AMPHETAMINE-DEXTROAMPHETAMINE 10 MG PO TABS: 20.0000 mg | ORAL_TABLET | Freq: Every day | ORAL | Status: DC

## 2024-03-30 MED ORDER — PROPOFOL 10 MG/ML IV BOLUS
INTRAVENOUS | Status: DC | PRN
  Administered 2024-03-30: 80 mg via INTRAVENOUS
  Administered 2024-03-30: 40 mg via INTRAVENOUS
  Administered 2024-03-30: 30 mg via INTRAVENOUS

## 2024-03-30 MED ORDER — SODIUM CHLORIDE 0.9 % IV SOLN: INTRAVENOUS | Status: DC

## 2024-03-30 MED ORDER — LIDOCAINE 2% (20 MG/ML) 5 ML SYRINGE
INTRAMUSCULAR | Status: DC | PRN
  Administered 2024-03-30: 100 mg via INTRAVENOUS

## 2024-03-30 MED ORDER — MELATONIN 5 MG PO TABS
10.0000 mg | ORAL_TABLET | Freq: Every evening | ORAL | Status: DC | PRN
  Filled 2024-03-30: qty 2

## 2024-03-30 NOTE — Plan of Care (Signed)

## 2024-03-30 NOTE — Op Note (Signed)
 [facility]astro[facility] Patient Name: [name] Procedure Date: 03/30/2024 2:11 PM MRN: [id number] Account #: [id number] Date of Birth: [birth year] Admit Type: Inpatient Age: 68 Room: [facility] ENDO ROOM 4 Gender: Female Note Status: Finalized Instrument Name: Endoscope [id number] Procedure:             Upper GI endoscopy Indications:           Melena, Abnormal CT of the GI tract Providers:             Ole Schick MD, MD Medicines:             Monitored Anesthesia Care Complications:         No immediate complications. Estimated blood loss:                         Minimal. Procedure:             Pre-Anesthesia Assessment:                        - Prior to the procedure, a History and Physical was                         performed, and patient medications and allergies were                         reviewed. The patient is competent. The risks and                         benefits of the procedure and the sedation options and                         risks were discussed with the patient. All questions                         were answered and informed consent was obtained.                         Patient identification and proposed procedure were                         verified by the physician, the nurse, the                         anesthesiologist, the anesthetist and the technician                         in the endoscopy suite. Mental Status Examination:                         alert and oriented. Airway Examination: normal                         oropharyngeal airway and neck mobility. Respiratory                         Examination: clear to auscultation. CV Examination:                         normal. Prophylactic Antibiotics: The patient does not  require prophylactic antibiotics. Prior                         Anticoagulants: The patient has taken no anticoagulant                         or antiplatelet agents. ASA Grade  Assessment: III - A                         patient with severe systemic disease. After reviewing                         the risks and benefits, the patient was deemed in                         satisfactory condition to undergo the procedure. The                         anesthesia plan was to use monitored anesthesia care                         (MAC). Immediately prior to administration of                         medications, the patient was re-assessed for adequacy                         to receive sedatives. The heart rate, respiratory                         rate, oxygen saturations, blood pressure, adequacy of                         pulmonary ventilation, and response to care were                         monitored throughout the procedure. The physical                         status of the patient was re-assessed after the                         procedure.                        After obtaining informed consent, the endoscope was                         passed under direct vision. Throughout the procedure,                         the patient's blood pressure, pulse, and oxygen                         saturations were monitored continuously. The Endoscope                         was introduced through the mouth, and advanced to the  duodenal bulb. The upper GI endoscopy was somewhat                         difficult. The patient tolerated the procedure well. Findings:      A small hiatal hernia was present.      The exam of the esophagus was otherwise normal.      Extrinsic compression on the stomach was found in the gastric antrum.      A large fungating and ulcerated mass with stigmata of recent bleeding       was found in the duodenal bulb. Biopsies were taken with a cold forceps       for histology. Estimated blood loss was minimal.      I did not try to go around the mass into the second portion of the       duodenum as patient was having a significant  coughing fit. She has not       symptoms of obstruction. Impression:            - Small hiatal hernia.                        - Extrinsic compression in the gastric antrum.                        - Malignant duodenal mass. Biopsied. Recommendation:        - Return patient to hospital ward for ongoing care.                        - Advance diet as tolerated.                        - Continue present medications.                        - Await pathology results.                        - Will need to have discussion about next steps as                         there are no good options. Would involve palliative                         care/oncology about any options. 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 All rights reserved. The codes documented in this report are preliminary and upon coder review may  be revised to meet current compliance requirements. Ole Schick MD, MD 03/30/2024 3:02:32 PM Number of Addenda: 0 Note Initiated On: 03/30/2024 2:11 PM Estimated Blood Loss:  Estimated blood loss was minimal.      [facility]

## 2024-03-30 NOTE — Progress Notes (Signed)
  PROGRESS NOTE    [name]  FMW:[id number] DOB: 1955/05/25 DOA: 03/29/2024 PCP: Arloa Elsie SAUNDERS, MD  112A/112A-AA  LOS: 1 day   Brief hospital course:   Assessment & Plan: [name] is a 68 y.o. 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   She first noticed dark stools about 1 month ago.  Hgb on presentation 5.8.   Upper GI Bleed --EGD found cancer mass has invaded the small bowel, and is necrotic.  --consult oncology and palliative   Symptomatic Anemia --bleeding from necrotic cancer mass. --Hgb dropped from 12.9 to 5.8 in about 1 month. --s/p 2u pRBC --monitor Hgb and transfuse to keep Hgb >7   Metastatic Ovarian Cancer CT 02/22/2024 with interval increase in seizre of heterogenous enlarged LN/mass between pancreatic head and dsital stomach, interval increase in size of centrally necrotic pericardiophrenic LN, R external iliac LN's similar to slightly increased in size.  Asymmetric nodular soft tissue on R side of vaginal cuff. Follows with Dr. Lonn outpatient, at this time, not pursuing additional treatment for her cancer (see 02/29/2024 note from oncology) --consult oncology and palliative care  Cancer Related Pain --cont home opioid regimen   Hypertension --hold home atenolol     ADHD --hold home Adderall    COPD --cont bronchodilators   Cramping Takes baclofen  nightly prn    RA Plaquenil - this was not in her med list that we reviewed - follow final med rec   Hx MS Not on meds   DVT prophylaxis: SCD/Compression stockings Code Status: Full code  Family Communication: sister updated at bedside today Level of care: Med-Surg Dispo:   The patient is from: home Anticipated d/c is to: home Anticipated d/c date is: 1-2 days   Subjective and Interval History:  Pt reported feeling better.  No abdominal  pain.  EGD found cancer mass has invaded the small bowel, and is necrotic.     Objective: Vitals:   03/30/24 1434 03/30/24 1457 03/30/24 1507 03/30/24 1547  BP: (!) 158/86 116/70 (!) 165/92 (!) 148/91  Pulse:  97 85 84  Resp: 20 12 16 18   Temp: (!) 96.7 F (35.9 C)   98.2 F (36.8 C)  TempSrc: Tympanic   Oral  SpO2: 100% 100% 100% 100%  Weight: 75 kg     Height: 5' 9 (1.753 m)       Intake/Output Summary (Last 24 hours) at 03/30/2024 1802 Last data filed at 03/30/2024 0415 Gross per 24 hour  Intake 717.67 ml  Output --  Net 717.67 ml   Filed Weights   03/29/24 1238 03/30/24 1434  Weight: 75 kg 75 kg    Examination:   Constitutional: NAD, AAOx3 HEENT: conjunctivae and lids normal, EOMI CV: No cyanosis.   RESP: normal respiratory effort, on RA Neuro: II - XII grossly intact.   Psych: Normal mood and affect.  Appropriate judgement and reason   Data Reviewed: I have personally reviewed labs and imaging studies  Time spent: 50 minutes  Ellouise Haber, MD [facility] If 7PM-7AM, please contact night-coverage 03/30/2024, 6:02 PM

## 2024-03-30 NOTE — Anesthesia Postprocedure Evaluation (Signed)
 Anesthesia Post Note  Patient: Arin L Arviso  Procedure(s) Performed: EGD (ESOPHAGOGASTRODUODENOSCOPY)  Patient location during evaluation: Endoscopy Anesthesia Type: General Level of consciousness: awake and alert Pain management: pain level controlled Vital Signs Assessment: post-procedure vital signs reviewed and stable Respiratory status: spontaneous breathing, nonlabored ventilation, respiratory function stable and patient connected to nasal cannula oxygen Cardiovascular status: blood pressure returned to baseline and stable Postop Assessment: no apparent nausea or vomiting Anesthetic complications: no   No notable events documented.   Last Vitals:  Vitals:   03/30/24 1507 03/30/24 1547  BP: (!) 165/92 (!) 148/91  Pulse: 85 84  Resp: 16 18  Temp:  36.8 C  SpO2: 100% 100%    Last Pain:  Vitals:   03/30/24 1548  TempSrc:   PainSc: 6                  Debby Mines

## 2024-03-30 NOTE — Anesthesia Preprocedure Evaluation (Signed)
 Anesthesia Evaluation  Patient identified by MRN, date of birth, ID band Patient awake    Reviewed: Allergy & Precautions, NPO status , Patient's Chart, lab work & pertinent test results  Airway Mallampati: II  TM Distance: >3 FB Neck ROM: Full    Dental no notable dental hx. (+) Partial Lower   Pulmonary neg pulmonary ROS, asthma , Patient did not abstain from smoking., former smoker   Pulmonary exam normal breath sounds clear to auscultation       Cardiovascular hypertension, negative cardio ROS Normal cardiovascular exam Rhythm:Regular Rate:Normal     Neuro/Psych  PSYCHIATRIC DISORDERS Anxiety      Neuromuscular disease negative neurological ROS  negative psych ROS   GI/Hepatic negative GI ROS, Neg liver ROS,GERD  Medicated and Controlled,,(+) Hepatitis -  Endo/Other  negative endocrine ROS    Renal/GU negative Renal ROSLab Results      Component                Value               Date                      CREATININE               0.90                08/09/2022                  K                        3.5                 08/09/2022                negative genitourinary   Musculoskeletal   Abdominal   Peds  Hematology negative hematology ROS (+) Lab Results      Component                Value               Date                      WBC                      5.3                 08/09/2022                HGB                      13.1                08/09/2022                HCT                      38.6                08/09/2022                     PLT                      178                 08/09/2022  Anesthesia Other Findings Patient with a PMH of stage 4 ovarian cancer.   Past Medical History: No date: Anxiety No date: Asthma No date: Back problem     Comment:  disc disease, chronic low back pain, right leg pain, s/p              discectomy 99 No date: Cancer (HCC)     Comment:  ovarian  cancer No date: Depression No date: Diastolic dysfunction     Comment:  with elevated LVEDP at cath No date: Endometriosis No date: Fibroids     Comment:  hx of  No date: GERD (gastroesophageal reflux disease) No date: Heart murmur No date: Hepatitis C     Comment:  treated 8-9 years ago No date: Hidradenitis suppurativa No date: History of kidney stones No date: Hypertension No date: MS (multiple sclerosis) No date: Neuromuscular disorder (HCC)     Comment:  Mulitple sclerosis No date: Osteoarthritis No date: Pre-diabetes No date: Prediabetes No date: RA (rheumatoid arthritis) (HCC) No date: Rotator cuff tear     Comment:  right shoulder  No date: Syncope  Past Surgical History: No date: ANGIOPLASTY No date: BACK SURGERY     Comment:  lumbar surgery x 2 done in [place]  and [place] 08/16/2022: COLON RESECTION SIGMOID     Comment:  Procedure: COLON RESECTION SIGMOID;  Surgeon: Viktoria Comer SAUNDERS, MD;  Location: [facility];  Service:               Gynecology;; 07/2010: COLONOSCOPY     Comment:  tubular adenoma 08/2015: COLONOSCOPY 08/16/2022: PHYLLIS; N/A     Comment:  Procedure: CYSTOSCOPY;  Surgeon: Viktoria Comer SAUNDERS,               MD;  Location: [facility];  Service: Gynecology;  Laterality:              N/A; No date: DISKECTOMY 08/16/2022: HYSTERECTOMY ABDOMINAL WITH SALPINGO-OOPHORECTOMY     Comment:  Procedure: HYSTERECTOMY ABDOMINAL WITH               SALPINGO-OOPHORECTOMY;  Surgeon: Viktoria Comer SAUNDERS, MD;              Location: [facility];  Service: Gynecology;; 05/18/2022: IR IMAGING GUIDED PORT INSERTION 07/03/2023: IR IMAGING GUIDED PORT INSERTION 12/20/2022: IR REMOVAL TUN ACCESS W/ PORT W/O FL MOD SED 05/18/2022: IR US  GUIDE BX ASP/DRAIN 08/16/2022: LAPAROSCOPY; N/A     Comment:  Procedure: LAPAROSCOPY DIAGNOSTIC;  Surgeon: Viktoria Comer SAUNDERS, MD;  Location: [facility];  Service: Gynecology;              Laterality: N/A; No date:  laparoscopy for endometriosis No date: left shoulder scope 05/18/2022: RADIOLOGY WITH ANESTHESIA; N/A     Comment:  Procedure: IR WITH ANESTHESIA PORT AND BIOPSY;  Surgeon:              Luverne Aran, MD;  Location: [facility];  Service:               Radiology;  Laterality: N/A; No date: ROTATOR CUFF REPAIR; Left 12/2016: Spinal Fusion     Comment:  L4-S1  BMI    Body Mass Index: 24.42 kg/m      Reproductive/Obstetrics negative OB ROS  Anesthesia Physical Anesthesia Plan  ASA: 2  Anesthesia Plan: General   Post-op Pain Management: Minimal or no pain anticipated   Induction: Intravenous  PONV Risk Score and Plan: 2 and Propofol  infusion and TIVA  Airway Management Planned: Nasal Cannula  Additional Equipment: None  Intra-op Plan:   Post-operative Plan:   Informed Consent: I have reviewed the patients History and Physical, chart, labs and discussed the procedure including the risks, benefits and alternatives for the proposed anesthesia with the patient or authorized representative who has indicated his/her understanding and acceptance.     Dental advisory given  Plan Discussed with: CRNA and Surgeon  Anesthesia Plan Comments: (Discussed risks of anesthesia with patient, including possibility of difficulty with spontaneous ventilation under anesthesia necessitating airway intervention, PONV, and rare risks such as cardiac or respiratory or neurological events, and allergic reactions. Discussed the role of CRNA in patient's perioperative care. Patient understands.)        Anesthesia Quick Evaluation

## 2024-03-30 NOTE — Transfer of Care (Signed)
 Immediate Anesthesia Transfer of Care Note  Patient: [name]  Procedure(s) Performed: EGD (ESOPHAGOGASTRODUODENOSCOPY)  Patient Location: Endoscopy Unit  Anesthesia Type:General  Level of Consciousness: awake  Airway & Oxygen Therapy: Patient Spontanous Breathing and Patient connected to nasal cannula oxygen  Post-op Assessment: Report given to RN and Post -op Vital signs reviewed and stable  Post vital signs: Reviewed and stable  Last Vitals:  Vitals Value Taken Time  BP 143/76   Temp    Pulse 100   Resp 12   SpO2 98     Last Pain:  Vitals:   03/30/24 1434  TempSrc: Tympanic  PainSc: 0-No pain      Patients Stated Pain Goal: 1 (03/30/24 0431)  Complications: No notable events documented.

## 2024-03-31 ENCOUNTER — Encounter: Payer: Self-pay | Admitting: Gastroenterology

## 2024-03-31 ENCOUNTER — Telehealth: Payer: Self-pay

## 2024-03-31 ENCOUNTER — Inpatient Hospital Stay

## 2024-03-31 DIAGNOSIS — K3189 Other diseases of stomach and duodenum: Secondary | ICD-10-CM

## 2024-03-31 DIAGNOSIS — K921 Melena: Secondary | ICD-10-CM

## 2024-03-31 DIAGNOSIS — C563 Malignant neoplasm of bilateral ovaries: Secondary | ICD-10-CM

## 2024-03-31 DIAGNOSIS — K5900 Constipation, unspecified: Secondary | ICD-10-CM

## 2024-03-31 LAB — TYPE AND SCREEN
ABO/RH(D): O POS
Antibody Screen: NEGATIVE
Unit division: 0
Unit division: 0

## 2024-03-31 LAB — BPAM RBC
Blood Product Expiration Date: 202512232359
ISSUE DATE / TIME: 202511221607
ISSUE DATE / TIME: 202511230018
ISSUE DATE / TIME: 202512232359
Unit Type and Rh: 202512232359
Unit Type and Rh: 202512232359
Unit Type and Rh: 5100
Unit Type and Rh: 5100

## 2024-03-31 LAB — HEMOGLOBIN: Hemoglobin: 8.3 g/dL — ABNORMAL LOW (ref 12.0–15.0)

## 2024-03-31 MED ORDER — BACLOFEN 10 MG PO TABS
10.0000 mg | ORAL_TABLET | Freq: Every day | ORAL | Status: DC
  Administered 2024-03-31: 10 mg via ORAL
  Filled 2024-03-31: qty 1

## 2024-03-31 MED ORDER — BACLOFEN 10 MG PO TABS
5.0000 mg | ORAL_TABLET | Freq: Every day | ORAL | Status: DC
  Administered 2024-03-31 – 2024-04-01 (×2): 5 mg via ORAL
  Filled 2024-03-31 (×2): qty 1

## 2024-03-31 MED ORDER — DOCUSATE SODIUM 100 MG PO CAPS
100.0000 mg | ORAL_CAPSULE | Freq: Every day | ORAL | Status: DC
  Administered 2024-03-31 – 2024-04-01 (×2): 100 mg via ORAL
  Filled 2024-03-31 (×2): qty 1

## 2024-03-31 MED ORDER — LORAZEPAM 0.5 MG PO TABS
0.5000 mg | ORAL_TABLET | Freq: Three times a day (TID) | ORAL | Status: DC
  Administered 2024-03-31 – 2024-04-01 (×3): 0.5 mg via ORAL
  Filled 2024-03-31 (×3): qty 1

## 2024-03-31 NOTE — Telephone Encounter (Signed)
 I am sorry she is admitted We can reschedule her appt sooner if needed but she needs to be DC first We will call her next week

## 2024-03-31 NOTE — Consult Note (Signed)
 Hematology/Oncology Consult note [phone] [phone]      Patient Care Team: Arloa Elsie SAUNDERS, MD as PCP - General (Family Medicine) [name] (Physician Assistant)   Name of the patient: [name]  [id number]  [birth year]   REASON FOR COSULTATION:   History of presenting illness-  68 y.o. female with PMH listed at below who presents to ER for evaluation of fatigue and melena for about a month. She also endorses epigastric discomfort, sharp intermittent pain.  Denies any recent NSAIDs or anticoagulation.  Patient was found to have profoundly anemic with a hemoglobin of 5.8 at admission.  Status post 2 units of PRBC transfusion.  03/29/2024, CT angiogram GI bleeding showed 1. Enlarging necrotic soft tissue mass interposed between the head of the pancreas and proximal duodenum. There is likely invasion of the dorsal wall of the distal stomach and duodenum, and this could be a source for chronic blood loss given clinical history. There is no evidence of active hemorrhage on today's exam. 2. Progressive adenopathy within the pericardiophrenic region and right external iliac chain consistent with worsening metastatic disease. 3. Para incisional hernia in the right supraumbilical region, containing a short segment of small bowel. No incarceration or obstruction.    03/30/2024 EGD showed hiatal hernia, extrinsic compression of the stomach was found in the gastric atrium.  A large fungating and ulcerating mass with stigmata of recent bleeding was found in the duodenal bulb.  Biopsies were taken.    Oncologist was consulted. Patient has a history of recurrent ovarian high-grade serous carcinoma and she follows up with Dr.Gorsuch.  Previous oncology records were reviewed.  Patient is currently off chemotherapy.   she was last seen by her oncologist in October 2025 and was reluctant to resume treatments.  Her CT scan in October 2025 showed a  disease progression.  Today, patient is status post EGD yesterday.  She felt well.  Fatigue is slightly better.  She complains pain around her Mediport which is a chronic issue for her.  Reports constipation, last bowel movement was 2 to 3 days ago.  She requests to have stool softener.  No nausea vomiting.  Pain is well-controlled.  Allergies  Allergen Reactions   Cymbalta [Duloxetine Hcl] Shortness Of Breath, Swelling and Other (See Comments)    Tongue and leg swelling   Cyclobenzaprine Hcl Other (See Comments)    Immobility   Celexa  [Citalopram Hydrobromide] Swelling and Other (See Comments)    Tongue swelling   Fluticasone  Furoate-Vilanterol Swelling and Other (See Comments)    BREO ELLIPTA - Tongue swelling, but breathing not affected   Gabapentin Other (See Comments)    Feels awful when taking, drowsy    Opana  [Oxymorphone Hcl] Itching and Other (See Comments)    Loss of hair, also   Tramadol Other (See Comments)    Dizziness     Patient Active Problem List   Diagnosis Date Noted   GI bleed 03/29/2024   Goals of care, counseling/discussion 12/13/2023   Oral infection 10/18/2023   Pancytopenia, acquired (HCC) 08/03/2023   Mucositis due to chemotherapy 08/03/2023   Anemia due to antineoplastic chemotherapy 07/26/2023   Nausea without vomiting 07/12/2023   Rash and nonspecific skin eruption 07/12/2023   Genetic testing 10/16/2022   Port-A-Cath in place 09/15/2022   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   Leukopenia due to antineoplastic chemotherapy 07/17/2022   Bilateral leg edema 05/30/2022   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 05/30/2022   Chronic use of opiate drug for therapeutic purpose 05/30/2022   Other constipation 05/30/2022   Bilateral primary ovarian cancer (HCC) 04/19/2022   Ovarian mass, left 04/19/2022   Multiple sclerosis 04/19/2022   Lumbar stenosis with neurogenic claudication 06/02/2020    Weakness of both lower extremities 06/02/2020   Lumbosacral radiculopathy 06/02/2020   Chest pain 10/18/2017   Anxiety    Hepatitis C    Hypertension    Osteoarthritis    RA (rheumatoid arthritis) (HCC)    Palpitations 02/02/2016   SYNCOPE AND COLLAPSE 10/23/2008   FIBROIDS, UTERUS 10/13/2008   Anxiety state 10/13/2008   DEPRESSION 10/13/2008   Essential hypertension 10/13/2008   VENTRICULAR TACHYCARDIA 10/13/2008   UNSPECIFIED PAROXYSMAL TACHYCARDIA 10/13/2008   Asthma 10/13/2008   GERD 10/13/2008   Endometriosis 10/13/2008   Osteoarthritis 10/13/2008   TACHYCARDIA 10/13/2008     Past Medical History:  Diagnosis Date   Anxiety    Asthma    Back problem    disc disease, chronic low back pain, right leg pain, s/p discectomy 99   Cancer (HCC)    ovarian cancer   Depression    Diastolic dysfunction    with elevated LVEDP at cath   Endometriosis    Fibroids    hx of    GERD (gastroesophageal reflux disease)    Heart murmur    Hepatitis C    treated 8-9 years ago   Hidradenitis suppurativa    History of kidney stones    Hypertension    MS (multiple sclerosis)    Neuromuscular disorder (HCC)    Mulitple sclerosis   Osteoarthritis    Pre-diabetes    Prediabetes    RA (rheumatoid arthritis) (HCC)    Rotator cuff tear    right shoulder    Syncope      Past Surgical History:  Procedure Laterality Date   ANGIOPLASTY     BACK SURGERY     lumbar surgery x 2 done in [place]  and [place]   COLON RESECTION SIGMOID  08/16/2022   Procedure: COLON RESECTION SIGMOID;  Surgeon: Viktoria Comer SAUNDERS, MD;  Location: [facility];  Service: Gynecology;;   COLONOSCOPY  07/2010   tubular adenoma   COLONOSCOPY  08/2015   CYSTOSCOPY N/A 08/16/2022   Procedure: CYSTOSCOPY;  Surgeon: Viktoria Comer SAUNDERS, MD;  Location: [facility];  Service: Gynecology;  Laterality: N/A;   DISKECTOMY     ESOPHAGOGASTRODUODENOSCOPY N/A 03/30/2024   Procedure: EGD (ESOPHAGOGASTRODUODENOSCOPY);   Surgeon: Maryruth Ole DASEN, MD;  Location: [facility];  Service: Endoscopy;  Laterality: N/A;   HYSTERECTOMY ABDOMINAL WITH SALPINGO-OOPHORECTOMY  08/16/2022   Procedure: HYSTERECTOMY ABDOMINAL WITH SALPINGO-OOPHORECTOMY;  Surgeon: Viktoria Comer SAUNDERS, MD;  Location: [facility];  Service: Gynecology;;   IR IMAGING GUIDED PORT INSERTION  05/18/2022   IR IMAGING GUIDED PORT INSERTION  07/03/2023   IR REMOVAL TUN ACCESS W/ PORT W/O FL MOD SED  12/20/2022   IR US  GUIDE BX ASP/DRAIN  05/18/2022   LAPAROSCOPY N/A 08/16/2022   Procedure: LAPAROSCOPY DIAGNOSTIC;  Surgeon: Viktoria Comer SAUNDERS, MD;  Location: [facility];  Service: Gynecology;  Laterality: N/A;   laparoscopy for endometriosis     left shoulder scope     RADIOLOGY WITH ANESTHESIA N/A 05/18/2022   Procedure: IR WITH ANESTHESIA PORT AND BIOPSY;  Surgeon: Luverne Aran, MD;  Location: [facility];  Service: Radiology;  Laterality: N/A;   ROTATOR CUFF REPAIR Left    Spinal Fusion  12/2016  L4-S1    Social History   Socioeconomic History   Marital status: Single    Spouse name: Not on file   Number of children: 1   Years of education: Not on file   Highest education level: Not on file  Occupational History   Not on file  Tobacco Use   Smoking status: Former    Current packs/day: 0.00    Average packs/day: 0.1 packs/day for 27.0 years (2.7 ttl pk-yrs)    Types: Cigarettes    Start date: 17    Quit date: 2023    Years since quitting: 2.8   Smokeless tobacco: Never  Vaping Use   Vaping status: Never Used  Substance and Sexual Activity   Alcohol use: Yes    Comment: Occasional    Drug use: Yes    Types: Marijuana   Sexual activity: Yes  Other Topics Concern   Not on file  Social History Narrative   Lives alone   Right handed   Caffeine: none    Social Drivers of Health   Financial Resource Strain: Not on file  Food Insecurity: Patient Declined (03/29/2024)   Hunger Vital Sign    Worried About Running Out of Food in the  Last Year: Patient declined    Ran Out of Food in the Last Year: Patient declined  Transportation Needs: Patient Declined (03/29/2024)   PRAPARE - Administrator, Civil Service (Medical): Patient declined    Lack of Transportation (Non-Medical): Patient declined  Physical Activity: Not on file  Stress: Not on file  Social Connections: Patient Declined (03/29/2024)   Social Connection and Isolation Panel    Frequency of Communication with Friends and Family: Patient declined    Frequency of Social Gatherings with Friends and Family: Patient declined    Attends Religious Services: Patient declined    Active Member of Clubs or Organizations: Patient declined    Attends Banker Meetings: Patient declined    Marital Status: Patient declined  Intimate Partner Violence: Patient Declined (03/29/2024)   Humiliation, Afraid, Rape, and Kick questionnaire    Fear of Current or Ex-Partner: Patient declined    Emotionally Abused: Patient declined    Physically Abused: Patient declined    Sexually Abused: Patient declined     Family History  Problem Relation Age of Onset   Heart disease Mother        Died with MI 71, chest pain 30s   Hypertension Mother    Pancreatitis Father    Atrial fibrillation Sister    Breast cancer Maternal Aunt        dx <50   Stomach cancer Maternal Grandfather        mets to liver? dx after 50   Multiple sclerosis Cousin    Breast cancer Cousin        mat female cousin; dx unknown age   Colon cancer Cousin 27       mat female cousin; mets   Leukemia Cousin 82       mat female cousin     Current Facility-Administered Medications:    acetaminophen  (TYLENOL ) tablet 650 mg, 650 mg, Oral, Q6H PRN **OR** acetaminophen  (TYLENOL ) suppository 650 mg, 650 mg, Rectal, Q6H PRN, Perri DELENA Meliton Mickey., MD   albuterol  (PROVENTIL ) (2.5 MG/3ML) 0.083% nebulizer solution 2.5 mg, 2.5 mg, Inhalation, Q6H PRN, Perri DELENA Meliton Mickey., MD   baclofen   (LIORESAL ) tablet 10 mg, 10 mg, Oral, QHS, Awanda City, MD   baclofen  (LIORESAL )  tablet 5 mg, 5 mg, Oral, Daily, Awanda City, MD, 5 mg at 03/31/24 1423   docusate sodium  (COLACE) capsule 100 mg, 100 mg, Oral, Daily, Babara Call, MD   fluticasone -salmeterol (ADVAIR) 250-50 MCG/ACT inhaler 1 puff, 1 puff, Inhalation, BID, Perri DELENA Meliton Mickey., MD, 1 puff at 03/31/24 9092   LORazepam  (ATIVAN ) tablet 0.5 mg, 0.5 mg, Oral, TID, Awanda City, MD, 0.5 mg at 03/31/24 1624   melatonin tablet 10 mg, 10 mg, Oral, QHS PRN, Awanda City, MD   morphine  (PF) 2 MG/ML injection 2 mg, 2 mg, Intravenous, Q2H PRN, Perri DELENA Meliton Mickey., MD, 2 mg at 03/30/24 1026   oxyCODONE  (Oxy IR/ROXICODONE ) immediate release tablet 20 mg, 20 mg, Oral, 5 X Daily PRN, Perri DELENA Meliton Mickey., MD, 20 mg at 03/31/24 1233   oxyCODONE  (OXYCONTIN ) 12 hr tablet 15 mg, 15 mg, Oral, Q8H, Perri DELENA Meliton Mickey., MD, 15 mg at 03/31/24 1300   [EXPIRED] pantoprazole  (PROTONIX ) injection 40 mg, 40 mg, Intravenous, Q5 min, 40 mg at 03/29/24 1544 **FOLLOWED BY** pantoprazole  (PROTONIX ) injection 40 mg, 40 mg, Intravenous, Q12H, Fernand Rossie HERO, MD, 40 mg at 03/31/24 9097  Review of Systems  Constitutional:  Positive for appetite change and fatigue. Negative for chills and fever.  HENT:   Negative for hearing loss and voice change.   Eyes:  Negative for eye problems.  Respiratory:  Negative for chest tightness and cough.   Cardiovascular:  Negative for chest pain.  Gastrointestinal:  Positive for constipation. Negative for abdominal distention and abdominal pain.       Melena  Endocrine: Negative for hot flashes.  Genitourinary:  Negative for difficulty urinating and frequency.   Musculoskeletal:  Negative for arthralgias.  Skin:  Negative for itching and rash.  Neurological:  Negative for extremity weakness.  Hematological:  Negative for adenopathy.  Psychiatric/Behavioral:  Negative for confusion.     PHYSICAL EXAM Vitals:   03/31/24 0002  03/31/24 0430 03/31/24 0900 03/31/24 1314  BP: 131/70 109/73 122/82 (!) 141/94  Pulse: 95 81 79 86  Resp: 16 15 18 18   Temp: 98.1 F (36.7 C) (!) 97.4 F (36.3 C) 98 F (36.7 C) 97.9 F (36.6 C)  TempSrc: Oral Oral Oral Oral  SpO2: 100% 100% 100% 97%  Weight:      Height:       Physical Exam Constitutional:      General: She is not in acute distress.    Appearance: She is not diaphoretic.  HENT:     Head: Normocephalic and atraumatic.  Eyes:     General: No scleral icterus. Cardiovascular:     Rate and Rhythm: Normal rate and regular rhythm.  Pulmonary:     Effort: Pulmonary effort is normal. No respiratory distress.     Breath sounds: No wheezing.  Abdominal:     General: There is no distension.     Palpations: Abdomen is soft.     Tenderness: There is no abdominal tenderness.  Musculoskeletal:        General: Normal range of motion.     Cervical back: Normal range of motion and neck supple.  Skin:    General: Skin is warm and dry.     Findings: No erythema.     Comments: Right side anterior chest wall Mediport with no surrounding erythema, discharge or swelling.  Neurological:     Mental Status: She is alert and oriented to person, place, and time. Mental status is at baseline.  Motor: No abnormal muscle tone.  Psychiatric:        Mood and Affect: Mood and affect normal.       LABORATORY STUDIES    Latest Ref Rng & Units 03/31/2024    5:48 AM 03/30/2024    7:22 AM 03/29/2024    9:36 PM  CBC  WBC 4.0 - 10.5 K/uL  5.5    Hemoglobin 12.0 - 15.0 g/dL 8.3  8.4  6.9   Hematocrit 36.0 - 46.0 %  25.2  20.6   Platelets 150 - 400 K/uL  224        Latest Ref Rng & Units 03/30/2024    7:22 AM 03/29/2024   12:40 PM 02/22/2024    9:11 AM  CMP  Glucose 70 - 99 mg/dL 871  894  876   BUN 8 - 23 mg/dL 10  19  15    Creatinine 0.44 - 1.00 mg/dL 9.21  9.13  9.08   Sodium 135 - 145 mmol/L 138  136  136   Potassium 3.5 - 5.1 mmol/L 3.8  3.7  3.8   Chloride 98 - 111  mmol/L 104  103  104   CO2 22 - 32 mmol/L 26  24  28    Calcium 8.9 - 10.3 mg/dL 8.7  8.7  9.7   Total Protein 6.5 - 8.1 g/dL 5.8   7.1   Total Bilirubin 0.0 - 1.2 mg/dL 0.3   0.3   Alkaline Phos 38 - 126 U/L 50   77   AST 15 - 41 U/L 23   15   ALT 0 - 44 U/L 15   13      RADIOGRAPHIC STUDIES: I have personally reviewed the radiological images as listed and agreed with the findings in the report. CT ANGIO GI BLEED Result Date: 03/29/2024 CLINICAL DATA:  Lightheadedness, dark stool for 1 month, epigastric pain for several days, history of ovarian cancer EXAM: CTA ABDOMEN AND PELVIS WITHOUT AND WITH CONTRAST TECHNIQUE: Multidetector CT imaging of the abdomen and pelvis was performed using the standard protocol during bolus administration of intravenous contrast. Multiplanar reconstructed images and MIPs were obtained and reviewed to evaluate the vascular anatomy. RADIATION DOSE REDUCTION: This exam was performed according to the departmental dose-optimization program which includes automated exposure control, adjustment of the mA and/or kV according to patient size and/or use of iterative reconstruction technique. CONTRAST:  OMNIPAQUE  IOHEXOL  350 MG/ML SOLN COMPARISON:  02/22/2024 FINDINGS: VASCULAR Aorta: Normal caliber aorta without aneurysm, dissection, vasculitis or significant stenosis. Mild atherosclerosis. Celiac: Patent without evidence of aneurysm, dissection, vasculitis or significant stenosis. SMA: Patent without evidence of aneurysm, dissection, vasculitis or significant stenosis. Renals: Both renal arteries are patent without evidence of aneurysm, dissection, vasculitis, fibromuscular dysplasia or significant stenosis. IMA: Patent without evidence of aneurysm, dissection, vasculitis or significant stenosis. Inflow: Patent without evidence of aneurysm, dissection, vasculitis or significant stenosis. Evaluation is slightly limited due to streak artifact from lumbar orthopedic hardware.  Proximal Outflow: Bilateral common femoral and visualized portions of the superficial and profunda femoral arteries are patent without evidence of aneurysm, dissection, vasculitis or significant stenosis. Veins: Continue mass effect upon the portal vein confluence due to the large mass between the duodenum and pancreatic head. No other acute venous abnormalities. Review of the MIP images confirms the above findings. NON-VASCULAR Lower chest: Pericardiophrenic lymph node is again identified, measuring 2.4 x 2.2 cm, previously 2.5 x 2.0 cm. No acute airspace disease. Hepatobiliary: No focal liver abnormality is seen.  No gallstones, gallbladder wall thickening, or biliary dilatation. Pancreas: Unremarkable. No pancreatic ductal dilatation or surrounding inflammatory changes. Spleen: Normal in size without focal abnormality. Adrenals/Urinary Tract: Kidneys enhance normally. No urinary tract calculi. 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 No left-sided obstruction. 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 No specific follow-up is required. Left adrenal is unremarkable. Bladder is normal. Stomach/Bowel: No bowel obstruction or ileus. Prior sigmoid colon resection and reanastomosis. Previous appendectomy. There is a large necrotic 7.8 x 8.7 x 8.1 cm mass interposed between the head of the pancreas and duodenal ball. This mass displaces and likely invades the wall of the distal stomach and proximal duodenum, and may be the source for chronic blood loss. However, on today's exam, there is no evidence of contrast accumulation within the bowel lumen to suggest active hemorrhage. Lymphatic: Previous index enlarged lymph node in the right external iliac chain now measures 12 mm in short axis reference image 62/13, previously 9 mm. Stable right external iliac/inguinal lymph node image 69/13 measuring 6 mm in short axis. No new adenopathy is  identified. Reproductive: Stable postsurgical changes from hysterectomy. Other: No free fluid or free intraperitoneal gas. Enlarged para incisional hernia in the right supraumbilical region, now containing a short segment of small bowel. No evidence of incarceration or obstruction. Musculoskeletal: Stable postsurgical changes within the lower lumbar spine. There are no acute or destructive bony abnormalities. Reconstructed images demonstrate no additional findings. IMPRESSION: VASCULAR 1. No evidence of active gastrointestinal bleeding. 2.  Aortic Atherosclerosis (ICD10-I70.0). NON-VASCULAR 1. Enlarging necrotic soft tissue mass interposed between the head of the pancreas and proximal duodenum. There is likely invasion of the dorsal wall of the distal stomach and duodenum, and this could be a source for chronic blood loss given clinical history. There is no evidence of active hemorrhage on today's exam. 2. Progressive adenopathy within the pericardiophrenic region and right external iliac chain consistent with worsening metastatic disease. 3. Para incisional hernia in the right supraumbilical region, containing a short segment of small bowel. No incarceration or obstruction. Electronically Signed   By: Ozell Daring M.D.   On: 03/29/2024 16:19   DG Chest 2 View Result Date: 03/29/2024 CLINICAL DATA:  Chest pain. EXAM: CHEST - 2 VIEW COMPARISON:  Chest CT dated 09/19/2022. FINDINGS: Right-sided Port-A-Cath with tip over central SVC. No focal consolidation, pleural effusion, pneumothorax. The cardiac silhouette is within normal limits. No acute osseous pathology. Degenerative changes of the shoulders. IMPRESSION: No active cardiopulmonary disease. Electronically Signed   By: Vanetta Chou M.D.   On: 03/29/2024 13:01   CT ABDOMEN PELVIS W CONTRAST Result Date: 02/24/2024 CLINICAL DATA:  Ovarian cancer, monitor, off chemotherapy. * Tracking Code: BO * EXAM: CT ABDOMEN AND PELVIS WITH CONTRAST TECHNIQUE:  Multidetector CT imaging of the abdomen and pelvis was performed using the standard protocol following bolus administration of intravenous contrast. RADIATION DOSE REDUCTION: This exam was performed according to the departmental dose-optimization program which includes automated exposure control, adjustment of the mA and/or kV according to patient size and/or use of iterative reconstruction technique. CONTRAST:  OMNIPAQUE  IOHEXOL  300 MG/ML  SOLN COMPARISON:  Multiple priors including CT November 22, 2023 FINDINGS: Lower chest: No acute abnormality. Centrally necrotic pericardiophrenic lymph node measures 18 mm in short axis on image 3/2 previously 16 mm Hepatobiliary: No suspicious hepatic lesion. Gallbladder is unremarkable. No biliary ductal dilation. Pancreas: No pancreatic ductal dilation or evidence of acute inflammation. Spleen: No splenomegaly.  Adrenals/Urinary Tract: 2.1 cm right adrenal nodule on image 20/2 stable over multiple prior examinations and previously characterized as a benign adenoma. Left adrenal gland appears normal. 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 Similar prominence of the left ureter with 8 mm stone at the UVJ. Stomach/Bowel: Stomach is minimally distended limiting evaluation. No pathologic dilation of small or large bowel. No evidence of acute bowel inflammation. Vascular/Lymphatic: Normal caliber abdominal aorta. Smooth IVC contours. Narrowing of the portal vein and portal confluence by a heterogeneous enlarged lymph node/mass which measures 7.4 x 6.5 cm on image 27/2 previously 4.7 x 4.1 cm when remeasured for consistency, this sits between the pancreatic head and distal stomach. Previously indexed right external iliac lymph node measures 9 mm in short axis on image 58/2 previously 7 mm -additional right external iliac lymph node previously indexed measures 6 mm in short axis on image 66/2 previously 7 mm. Reproductive: Prior  hysterectomy with similar asymmetric nodular soft tissue on the right side of the vaginal cuff on image 67/2. no suspicious adnexal mass. Other: No significant abdominopelvic free fluid. Postsurgical change in the anterior abdominal wall with fat containing incisional hernia. Musculoskeletal: No aggressive lytic or blastic lesion of bone. Spinal fusion hardware. Diffuse demineralization of bone. Thoracolumbar spondylosis. IMPRESSION: 1. Interval increase in size of the heterogeneous enlarged lymph node/mass between the pancreatic head and distal stomach. 2. Slight interval increase in size of the centrally necrotic pericardiophrenic lymph node. 3. Previously indexed right external iliac lymph nodes are similar to slightly increased in size. 4. Prior hysterectomy with similar asymmetric nodular soft tissue on the right side of the vaginal cuff. 5. Similar prominence of the left ureter with 8 mm stone at the UVJ. 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 Electronically Signed   By: Reyes Holder M.D.   On: 02/24/2024 13:03   MR BRAIN W WO CONTRAST Result Date: 02/18/2024 CLINICAL DATA:  Multiple sclerosis. EXAM: MRI HEAD WITHOUT AND WITH CONTRAST TECHNIQUE: Multiplanar, multiecho pulse sequences of the brain and surrounding structures were obtained without and with intravenous contrast. CONTRAST:  8 mL [employer]way  COMPARISON:  Head MRI 10/26/2022 FINDINGS: Brain: There is no evidence of an acute infarct, intracranial hemorrhage, mass, midline shift, or extra-axial fluid collection. Cerebral volume is within normal limits for age. The ventricles are normal in size. T2 hyperintensities in the juxtacortical, periventricular, and deep cerebral white matter bilaterally and in the pons are unchanged and moderately advanced for age. None show enhancement or restricted diffusion. Mild T2 heterogeneity in the thalami and at most minimal in the cerebellum are unchanged. Vascular: Major  intracranial vascular flow voids are preserved. Skull and upper cervical spine: Unremarkable bone marrow signal. Sinuses/Orbits: Unremarkable orbits. Paranasal sinuses and mastoid air cells are clear. Other: None. IMPRESSION: Unchanged moderately advanced white matter disease, nonspecific but may reflect a combination chronic demyelinating disease and chronic small vessel ischemia. No evidence of active demyelination. Electronically Signed   By: Dasie Hamburg M.D.   On: 02/18/2024 08:06     Assessment and plan-   # Acute blood loss anemia secondary to duodenal mass. Hemoglobin initially 5.8, improved after 2 units of PRBC transfusion. Continue monitor hemoglobin. If hemoglobin drops  consider consult IR for possible embolization.  # Duodenal mass, status post biopsy.  Recurrent ovarian cancer This is likely due to local invasion of metastatic ovarian cancer. Check CA125. Currently off chemotherapy due to poor tolerance.  She is interested in  further discussion for her options. I recommend patient to follow-up with her primary oncologist for evaluation of chemotherapy treatment options-she has appointment next week. Prognosis is poor.   # Constipation, added Colace 100 mg daily.  Thank you for allowing me to participate in the care of this patient.   Zelphia Cap, MD, PhD Hematology Oncology 03/31/2024

## 2024-03-31 NOTE — Telephone Encounter (Signed)
 She called and is admitted to [facility] currently. She is asking if Dr. Lonn can look at here chart. Told her that she can have the MD reach out to Dr. Lonn if needed. She is complaining of port issues/ pain at port site. Told her to have her nurse look at the site and have the oncologist who rounds later today look at it. She verbalized understanding.

## 2024-03-31 NOTE — Progress Notes (Signed)
  PROGRESS NOTE    [name]  FMW:[id number] DOB: 1955/08/05 DOA: 03/29/2024 PCP: Arloa Elsie SAUNDERS, MD  [id number]  LOS: 2 days   Brief hospital course:   Assessment & Plan: [name] is a 68 y.o. 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   She first noticed dark stools about 1 month ago.  Hgb on presentation 5.8.   Upper GI Bleed --EGD found cancer mass has invaded the small bowel, and is necrotic.  --consult oncology with Dr. Babara  Symptomatic Anemia --bleeding from necrotic cancer mass. --Hgb dropped from 12.9 to 5.8 in about 1 month. --s/p 2u pRBC --monitor Hgb   Metastatic Ovarian Cancer CT 02/22/2024 with interval increase in seizre of heterogenous enlarged LN/mass between pancreatic head and dsital stomach, interval increase in size of centrally necrotic pericardiophrenic LN, R external iliac LN's similar to slightly increased in size.  Asymmetric nodular soft tissue on R side of vaginal cuff. Follows with Dr. Lonn outpatient, at this time, not pursuing additional treatment for her cancer (see 02/29/2024 note from oncology) --consult oncology with Dr. Babara   Cancer Related Pain --cont home opioid regimen  Anxiety on chronic benzo --cont home ativan  TID   Hypertension --hold home atenolol     ADHD --hold home Adderall    COPD --cont bronchodilators   Cramping --cont home baclofen     RA Plaquenil - this was not in her med list that we reviewed - follow final med rec   Hx MS Not on meds   DVT prophylaxis: SCD/Compression stockings Code Status: Full code  Family Communication:  Level of care: Med-Surg Dispo:   The patient is from: home Anticipated d/c is to: home Anticipated d/c date is: 1-2 days   Subjective and Interval History:  Pt was very anxious this morning, due to not receiving her home ativan   TID.  Pt later complained of pain in her left upper arm.   Objective: Vitals:   03/31/24 0002 03/31/24 0430 03/31/24 0900 03/31/24 1314  BP: 131/70 109/73 122/82 (!) 141/94  Pulse: 95 81 79 86  Resp: 16 15 18 18   Temp: 98.1 F (36.7 C) (!) 97.4 F (36.3 C) 98 F (36.7 C) 97.9 F (36.6 C)  TempSrc: Oral Oral Oral Oral  SpO2: 100% 100% 100% 97%  Weight:      Height:        Intake/Output Summary (Last 24 hours) at 03/31/2024 1836 Last data filed at 03/30/2024 1900 Gross per 24 hour  Intake 0 ml  Output --  Net 0 ml   Filed Weights   03/29/24 1238 03/30/24 1434  Weight: 75 kg 75 kg    Examination:   Constitutional: NAD, AAOx3 HEENT: conjunctivae and lids normal, EOMI CV: No cyanosis.   RESP: normal respiratory effort, on RA Neuro: II - XII grossly intact.   Psych: Normal mood and affect.  Appropriate judgement and reason    Data Reviewed: I have personally reviewed labs and imaging studies  Time spent: 35 minutes  Ellouise Haber, MD [facility] If 7PM-7AM, please contact night-coverage 03/31/2024, 6:36 PM

## 2024-03-31 NOTE — Telephone Encounter (Signed)
 Called and left below message. Ask her to call the office for questions. ?

## 2024-03-31 NOTE — Plan of Care (Signed)

## 2024-03-31 NOTE — Plan of Care (Signed)

## 2024-04-01 ENCOUNTER — Other Ambulatory Visit: Payer: Self-pay | Admitting: Hematology and Oncology

## 2024-04-01 ENCOUNTER — Telehealth: Payer: Self-pay

## 2024-04-01 DIAGNOSIS — K921 Melena: Secondary | ICD-10-CM | POA: Diagnosis not present

## 2024-04-01 DIAGNOSIS — C563 Malignant neoplasm of bilateral ovaries: Secondary | ICD-10-CM

## 2024-04-01 LAB — SURGICAL PATHOLOGY

## 2024-04-01 LAB — HEMOGLOBIN: Hemoglobin: 8.2 g/dL — ABNORMAL LOW (ref 12.0–15.0)

## 2024-04-01 LAB — CA 125: Cancer Antigen (CA) 125: 62.6 U/mL — ABNORMAL HIGH (ref 0.0–38.1)

## 2024-04-01 NOTE — Telephone Encounter (Signed)
 Called and moved appts to 12/2. She is aware of appts.

## 2024-04-01 NOTE — Telephone Encounter (Signed)
-----   Message from [name] sent at 04/01/2024 10:37 AM EST ----- She was DC this morning Can you move her labs/flush see me appt to 12/2? Thanks

## 2024-04-01 NOTE — Discharge Summary (Signed)
 Physician Discharge Summary   [name]  female DOB: 31-Jul-1955  FMW:[id number]  PCP: Arloa Elsie SAUNDERS, MD  Admit date: 03/29/2024 Discharge date: 04/01/2024  Admitted From: home Disposition:  home CODE STATUS: Full code   Hospital Course:  For full details, please see H&P, progress notes, consult notes and ancillary notes.  Briefly,  [name] is a 68 y.o. female with medical history significant of metastatic ovarian cancer (not currently undergoing treatment), RA, MS, COPD who presented with fatigue and DOE.   She first noticed dark stools about 1 month ago.  Hgb on presentation 5.8.   Upper GI Bleed --EGD found cancer mass has invaded the small bowel, and is necrotic.  --consulted oncology with Dr. Babara --pt's primary oncologist Dr. Lonn is aware, and will schedule her appt for next week.  Symptomatic Anemia --slow bleeding from necrotic cancer mass. --Hgb dropped from 12.9 to 5.8 in about 1 month. --s/p 2u pRBC --Hgb around 8's after transfusions.     Metastatic Ovarian Cancer CT 02/22/2024 with interval increase in seizre of heterogenous enlarged LN/mass between pancreatic head and dsital stomach, interval increase in size of centrally necrotic pericardiophrenic LN, R external iliac LN's similar to slightly increased in size.  Asymmetric nodular soft tissue on R side of vaginal cuff. Follows with Dr. Lonn outpatient, at this time, not pursuing additional treatment for her cancer (see 02/29/2024 note from oncology) --Dr. Lonn is aware, and will schedule her appt for next week.   Cancer Related Pain --cont home opioid regimen   Anxiety on chronic benzo --cont home ativan  TID   Hypertension, currently not active --Hold home atenolol  and irbesartan  because BP normal without antihypertensives, and also having ongoing slow GI bleeding.   ADHD --cont home Adderall    COPD --cont bronchodilators   Cramping --cont home baclofen     RA Cont  home Plaquenil    Hx MS Not on meds    Unless noted above, medications under STOP list are ones pt was not taking PTA.  Discharge Diagnoses:  Principal Problem:   GI bleed Active Problems:   Malignant neoplasm of both ovaries (HCC)   Constipation   Duodenal mass   30 Day Unplanned Readmission Risk Score    Flowsheet Row ED to Hosp-Admission (Current) from 03/29/2024 in [facility] 1C MEDICAL TELEMETRY  30 Day Unplanned Readmission Risk Score (%) 18.54 Filed at 04/01/2024 0801    This score is the patient's risk of an unplanned readmission within 30 days of being discharged (0 -100%). The score is based on dignosis, age, lab data, medications, orders, and past utilization.   Low:  0-14.9   Medium: 15-21.9   High: 22-29.9   Extreme: 30 and above         Discharge Instructions:  Allergies as of 04/01/2024       Reactions   Cymbalta [duloxetine Hcl] Shortness Of Breath, Swelling, Other (See Comments)   Tongue and leg swelling   Cyclobenzaprine Hcl Other (See Comments)   Immobility   Celexa  [citalopram Hydrobromide] Swelling, Other (See Comments)   Tongue swelling   Fluticasone  Furoate-vilanterol Swelling, Other (See Comments)   BREO ELLIPTA - Tongue swelling, but breathing not affected   Gabapentin Other (See Comments)   Feels awful when taking, drowsy    Opana  [oxymorphone Hcl] Itching, Other (See Comments)   Loss of hair, also   Tramadol Other (See Comments)   Dizziness        Medication List  PAUSE taking these medications    atenolol  25 MG tablet Wait to take this until your doctor or other care provider tells you to start again. BP normal without this med, and also having slow GI bleeding. Commonly known as: TENORMIN  TAKE 1 TABLET (25 MG TOTAL) BY MOUTH DAILY.   irbesartan  300 MG tablet Wait to take this until your doctor or other care provider tells you to start again. BP normal without this med, and also having slow GI  bleeding. Commonly known as: AVAPRO  Take 1 tablet by mouth daily.       STOP taking these medications    diclofenac Sodium 1 % Gel Commonly known as: VOLTAREN   Melatonin 10 MG Tabs   mupirocin ointment 2 % Commonly known as: BACTROBAN   tiZANidine 2 MG tablet Commonly known as: ZANAFLEX       TAKE these medications    albuterol  108 (90 Base) MCG/ACT inhaler Commonly known as: VENTOLIN  HFA Inhale 2 puffs into the lungs every 6 (six) hours as needed for shortness of breath.   amphetamine -dextroamphetamine  20 MG tablet Commonly known as: ADDERALL  Take 10-20 mg by mouth See admin instructions. Take 20 mg by mouth in the morning and 10 mg between 1-2 PM daily   ASHWAGANDHA PO Take 1 capsule by mouth daily.   baclofen  10 MG tablet Commonly known as: LIORESAL  Take 10 mg by mouth 3 (three) times daily as needed.   BLACK CURRANT SEED OIL PO Take 1 capsule by mouth daily.   hydroxychloroquine 200 MG tablet Commonly known as: PLAQUENIL Take 200 mg by mouth 2 (two) times daily.   lidocaine -prilocaine  cream Commonly known as: EMLA  Apply to affected area once   LORazepam  0.5 MG tablet Commonly known as: ATIVAN  Take 0.5 mg by mouth 3 (three) times daily as needed for anxiety.   multivitamin with minerals Tabs tablet Take 1 tablet by mouth daily.   Narcan  4 MG/0.1ML Liqd nasal spray kit Generic drug: naloxone  Place 1 spray into the nose as needed (opioid overdose).   omeprazole  20 MG capsule Commonly known as: PRILOSEC TAKE 1 CAPSULE BY MOUTH EVERY DAY   ondansetron  8 MG tablet Commonly known as: Zofran  Take 1 tablet (8 mg total) by mouth every 8 (eight) hours as needed for nausea or vomiting.   OVER THE COUNTER MEDICATION Take 2 capsules by mouth daily. Sea Moss advanced supplement   Oxycodone  HCl 20 MG Tabs Take 20 mg by mouth 5 (five) times daily as needed (pain).   polyethylene glycol 17 g packet Commonly known as: MIRALAX  / GLYCOLAX  Take 17 g by  mouth daily.   prochlorperazine  10 MG tablet Commonly known as: COMPAZINE  Take 1 tablet (10 mg total) by mouth every 6 (six) hours as needed for nausea or vomiting.   vitamin C 1000 MG tablet Take 1,000 mg by mouth daily.   Vitamin D (Ergocalciferol) 1.25 MG (50000 UNIT) Caps capsule Commonly known as: DRISDOL Take 50,000 Units by mouth every Sunday.   Wixela Inhub 250-50 MCG/ACT Aepb Generic drug: fluticasone -salmeterol Inhale 1 puff into the lungs daily.   Xtampza  ER 13.5 MG C12a Generic drug: oxyCODONE  ER Take 13.5 mg by mouth in the morning, at noon, and at bedtime.         Follow-up Information     Arloa Elsie SAUNDERS, MD Follow up.   Specialty: Family Medicine Why: hospital follow up Contact information: [address] [place] [address] [phone]  Lonn Hicks, MD Follow up in 1 week(s).   Specialty: Hematology and Oncology Contact information: [address] [place] [address] [phone]                 Allergies  Allergen Reactions   Cymbalta [Duloxetine Hcl] Shortness Of Breath, Swelling and Other (See Comments)    Tongue and leg swelling   Cyclobenzaprine Hcl Other (See Comments)    Immobility   Celexa  [Citalopram Hydrobromide] Swelling and Other (See Comments)    Tongue swelling   Fluticasone  Furoate-Vilanterol Swelling and Other (See Comments)    BREO ELLIPTA - Tongue swelling, but breathing not affected   Gabapentin Other (See Comments)    Feels awful when taking, drowsy    Opana  [Oxymorphone Hcl] Itching and Other (See Comments)    Loss of hair, also   Tramadol Other (See Comments)    Dizziness      The results of significant diagnostics from this hospitalization (including imaging, microbiology, ancillary and laboratory) are listed below for reference.   Consultations:   Procedures/Studies: US  Venous Img Upper Uni Left (DVT) Result Date: 04/01/2024 CLINICAL DATA:  LEFT upper  extremity pain. History of tobacco use and ovarian malignancy. EXAM: LEFT UPPER EXTREMITY VENOUS DOPPLER ULTRASOUND TECHNIQUE: Gray-scale sonography with graded compression, as well as color Doppler and duplex ultrasound were performed to evaluate the upper extremity deep venous system from the level of the subclavian vein and including the jugular, axillary, basilic, radial, ulnar and upper cephalic vein. Spectral Doppler was utilized to evaluate flow at rest and with distal augmentation maneuvers. COMPARISON:  None available FINDINGS: Internal Jugular Vein: No evidence of thrombus. Normal compressibility, respiratory phasicity and response to augmentation. Subclavian Vein: No evidence of thrombus. Normal compressibility, respiratory phasicity and response to augmentation. Axillary Vein: No evidence of thrombus. Normal compressibility, respiratory phasicity and response to augmentation. Cephalic Vein: Intramural filling defect with decreased flow and compressibility of the cephalic vein and adjacent varicose vein at the level of the mid upper arm is consistent with acute DVT. This also corresponds to the patient's palpable abnormality. Basilic Vein: No evidence of thrombus. Normal compressibility, respiratory phasicity and response to augmentation. Brachial Veins: No evidence of thrombus. Normal compressibility, respiratory phasicity and response to augmentation. Radial Veins: No evidence of thrombus. Normal compressibility, respiratory phasicity and response to augmentation. Ulnar Veins: No evidence of thrombus. Normal compressibility, respiratory phasicity and response to augmentation. Venous Reflux:  None visualized. Other Findings:  None visualized. IMPRESSION: 1. No DVT of the LEFT upper extremity. 2. Acute superficial venous thrombosis of the cephalic vein and adjacent varicose vein at the level of the mid upper arm. Electronically Signed   By: Aliene Lloyd M.D.   On: 04/01/2024 07:35   CT ANGIO GI  BLEED Result Date: 03/29/2024 CLINICAL DATA:  Lightheadedness, dark stool for 1 month, epigastric pain for several days, history of ovarian cancer EXAM: CTA ABDOMEN AND PELVIS WITHOUT AND WITH CONTRAST TECHNIQUE: Multidetector CT imaging of the abdomen and pelvis was performed using the standard protocol during bolus administration of intravenous contrast. Multiplanar reconstructed images and MIPs were obtained and reviewed to evaluate the vascular anatomy. RADIATION DOSE REDUCTION: This exam was performed according to the departmental dose-optimization program which includes automated exposure control, adjustment of the mA and/or kV according to patient size and/or use of iterative reconstruction technique. CONTRAST:  OMNIPAQUE  IOHEXOL  350 MG/ML SOLN COMPARISON:  02/22/2024 FINDINGS: VASCULAR Aorta: Normal caliber aorta without aneurysm, dissection, vasculitis or significant stenosis.  Mild atherosclerosis. Celiac: Patent without evidence of aneurysm, dissection, vasculitis or significant stenosis. SMA: Patent without evidence of aneurysm, dissection, vasculitis or significant stenosis. Renals: Both renal arteries are patent without evidence of aneurysm, dissection, vasculitis, fibromuscular dysplasia or significant stenosis. IMA: Patent without evidence of aneurysm, dissection, vasculitis or significant stenosis. Inflow: Patent without evidence of aneurysm, dissection, vasculitis or significant stenosis. Evaluation is slightly limited due to streak artifact from lumbar orthopedic hardware. Proximal Outflow: Bilateral common femoral and visualized portions of the superficial and profunda femoral arteries are patent without evidence of aneurysm, dissection, vasculitis or significant stenosis. Veins: Continue mass effect upon the portal vein confluence due to the large mass between the duodenum and pancreatic head. No other acute venous abnormalities. Review of the MIP images confirms the above findings.  NON-VASCULAR Lower chest: Pericardiophrenic lymph node is again identified, measuring 2.4 x 2.2 cm, previously 2.5 x 2.0 cm. No acute airspace disease. Hepatobiliary: No focal liver abnormality is seen. No gallstones, gallbladder wall thickening, or biliary dilatation. Pancreas: Unremarkable. No pancreatic ductal dilatation or surrounding inflammatory changes. Spleen: Normal in size without focal abnormality. Adrenals/Urinary Tract: Kidneys enhance normally. No urinary tract calculi. 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 No left-sided obstruction. 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 No specific follow-up is required. Left adrenal is unremarkable. Bladder is normal. Stomach/Bowel: No bowel obstruction or ileus. Prior sigmoid colon resection and reanastomosis. Previous appendectomy. There is a large necrotic 7.8 x 8.7 x 8.1 cm mass interposed between the head of the pancreas and duodenal ball. This mass displaces and likely invades the wall of the distal stomach and proximal duodenum, and may be the source for chronic blood loss. However, on today's exam, there is no evidence of contrast accumulation within the bowel lumen to suggest active hemorrhage. Lymphatic: Previous index enlarged lymph node in the right external iliac chain now measures 12 mm in short axis reference image 62/13, previously 9 mm. Stable right external iliac/inguinal lymph node image 69/13 measuring 6 mm in short axis. No new adenopathy is identified. Reproductive: Stable postsurgical changes from hysterectomy. Other: No free fluid or free intraperitoneal gas. Enlarged para incisional hernia in the right supraumbilical region, now containing a short segment of small bowel. No evidence of incarceration or obstruction. Musculoskeletal: Stable postsurgical changes within the lower lumbar spine. There are no acute or destructive bony abnormalities.  Reconstructed images demonstrate no additional findings. IMPRESSION: VASCULAR 1. No evidence of active gastrointestinal bleeding. 2.  Aortic Atherosclerosis (ICD10-I70.0). NON-VASCULAR 1. Enlarging necrotic soft tissue mass interposed between the head of the pancreas and proximal duodenum. There is likely invasion of the dorsal wall of the distal stomach and duodenum, and this could be a source for chronic blood loss given clinical history. There is no evidence of active hemorrhage on today's exam. 2. Progressive adenopathy within the pericardiophrenic region and right external iliac chain consistent with worsening metastatic disease. 3. Para incisional hernia in the right supraumbilical region, containing a short segment of small bowel. No incarceration or obstruction. Electronically Signed   By: Ozell Daring M.D.   On: 03/29/2024 16:19   DG Chest 2 View Result Date: 03/29/2024 CLINICAL DATA:  Chest pain. EXAM: CHEST - 2 VIEW COMPARISON:  Chest CT dated 09/19/2022. FINDINGS: Right-sided Port-A-Cath with tip over central SVC. No focal consolidation, pleural effusion, pneumothorax. The cardiac silhouette is within normal limits. No acute osseous pathology. Degenerative changes of the shoulders. IMPRESSION: No active cardiopulmonary disease.  Electronically Signed   By: Vanetta Chou M.D.   On: 03/29/2024 13:01      Labs: BNP (last 3 results) No results for input(s): BNP in the last 8760 hours. Basic Metabolic Panel: Recent Labs  Lab 03/29/24 1240 03/30/24 0722  NA 136 138  K 3.7 3.8  CL 103 104  CO2 24 26  GLUCOSE 105* 128*  BUN 19 10  CREATININE 0.86 0.78  CALCIUM 8.7* 8.7*   Liver Function Tests: Recent Labs  Lab 03/30/24 0722  AST 23  ALT 15  ALKPHOS 50  BILITOT 0.3  PROT 5.8*  ALBUMIN  3.6   No results for input(s): LIPASE, AMYLASE in the last 168 hours. No results for input(s): AMMONIA in the last 168 hours. CBC: Recent Labs  Lab 03/29/24 1240 03/29/24 2136  03/30/24 0722 03/31/24 0548 04/01/24 0345  WBC 5.7  --  5.5  --   --   HGB 5.8* 6.9* 8.4* 8.3* 8.2*  HCT 18.4* 20.6* 25.2*  --   --   MCV 105.1*  --  94.7  --   --   PLT 265  --  224  --   --    Cardiac Enzymes: No results for input(s): CKTOTAL, CKMB, CKMBINDEX, TROPONINI in the last 168 hours. BNP: Invalid input(s): POCBNP CBG: No results for input(s): GLUCAP in the last 168 hours. D-Dimer No results for input(s): DDIMER in the last 72 hours. Hgb A1c No results for input(s): HGBA1C in the last 72 hours. Lipid Profile No results for input(s): CHOL, HDL, LDLCALC, TRIG, CHOLHDL, LDLDIRECT in the last 72 hours. Thyroid  function studies No results for input(s): TSH, T4TOTAL, T3FREE, THYROIDAB in the last 72 hours.  Invalid input(s): FREET3 Anemia work up Recent Labs    03/30/24 0722  VITAMINB12 714  FOLATE 14.4  FERRITIN 86  TIBC 328  IRON 36   Urinalysis    Component Value Date/Time   PROTEINUR NEGATIVE 08/17/2023 0932   Sepsis Labs Recent Labs  Lab 03/29/24 1240 03/30/24 0722  WBC 5.7 5.5   Microbiology No results found for this or any previous visit (from the past 240 hours).   Total time spend on discharging this patient, including the last patient exam, discussing the hospital stay, instructions for ongoing care as it relates to all pertinent caregivers, as well as preparing the medical discharge records, prescriptions, and/or referrals as applicable, is 35 minutes.    Ellouise Haber, MD  [facility] 04/01/2024, 9:16 AM

## 2024-04-01 NOTE — Plan of Care (Signed)
  Problem: Health Behavior/Discharge Planning: Goal: Ability to manage health-related needs will improve Outcome: Progressing   Problem: Clinical Measurements: Goal: Will remain free from infection Outcome: Progressing   Problem: Coping: Goal: Level of anxiety will decrease Outcome: Progressing   Problem: Elimination: Goal: Will not experience complications related to bowel motility Outcome: Progressing

## 2024-04-08 ENCOUNTER — Telehealth: Payer: Self-pay

## 2024-04-08 ENCOUNTER — Inpatient Hospital Stay: Admitting: Hematology and Oncology

## 2024-04-08 ENCOUNTER — Inpatient Hospital Stay: Attending: Gynecologic Oncology

## 2024-04-08 NOTE — Telephone Encounter (Signed)
 My only available appt until my vacation is on 12/12 or 12/16

## 2024-04-08 NOTE — Telephone Encounter (Signed)
 Returned her call. She cannot come to appts today due to not feeling well and not sleeping last night. Appts canceled. She is available 12/4 or 12/5 to reschedule.

## 2024-04-08 NOTE — Telephone Encounter (Signed)
 Called and scheduled appts on 12/12. She is aware of appts.

## 2024-04-14 ENCOUNTER — Inpatient Hospital Stay

## 2024-04-14 ENCOUNTER — Inpatient Hospital Stay: Admitting: Hematology and Oncology

## 2024-04-18 ENCOUNTER — Inpatient Hospital Stay: Attending: Gynecologic Oncology | Admitting: Hematology and Oncology

## 2024-04-18 ENCOUNTER — Inpatient Hospital Stay

## 2024-04-18 ENCOUNTER — Encounter: Payer: Self-pay | Admitting: Hematology and Oncology

## 2024-04-18 VITALS — BP 116/71 | HR 96 | Temp 99.9°F | Resp 18 | Ht 69.0 in | Wt 157.0 lb

## 2024-04-18 DIAGNOSIS — K921 Melena: Secondary | ICD-10-CM | POA: Diagnosis not present

## 2024-04-18 DIAGNOSIS — C563 Malignant neoplasm of bilateral ovaries: Secondary | ICD-10-CM | POA: Diagnosis present

## 2024-04-18 DIAGNOSIS — R630 Anorexia: Secondary | ICD-10-CM | POA: Insufficient documentation

## 2024-04-18 DIAGNOSIS — D61818 Other pancytopenia: Secondary | ICD-10-CM | POA: Insufficient documentation

## 2024-04-18 LAB — CBC WITH DIFFERENTIAL/PLATELET
Abs Immature Granulocytes: 0.04 K/uL (ref 0.00–0.07)
Basophils Absolute: 0.1 K/uL (ref 0.0–0.1)
Basophils Relative: 1 %
Eosinophils Absolute: 0 K/uL (ref 0.0–0.5)
Eosinophils Relative: 0 %
HCT: 26.2 % — ABNORMAL LOW (ref 36.0–46.0)
Hemoglobin: 8.6 g/dL — ABNORMAL LOW (ref 12.0–15.0)
Immature Granulocytes: 0 %
Lymphocytes Relative: 13 %
Lymphs Abs: 1.1 K/uL (ref 0.7–4.0)
MCH: 30.6 pg (ref 26.0–34.0)
MCHC: 32.8 g/dL (ref 30.0–36.0)
MCV: 93.2 fL (ref 80.0–100.0)
Monocytes Absolute: 1.3 K/uL — ABNORMAL HIGH (ref 0.1–1.0)
Monocytes Relative: 15 %
Neutro Abs: 6.4 K/uL (ref 1.7–7.7)
Neutrophils Relative %: 71 %
Platelets: 342 K/uL (ref 150–400)
RBC: 2.81 MIL/uL — ABNORMAL LOW (ref 3.87–5.11)
RDW: 14.6 % (ref 11.5–15.5)
WBC: 9 K/uL (ref 4.0–10.5)
nRBC: 0 % (ref 0.0–0.2)

## 2024-04-18 LAB — COMPREHENSIVE METABOLIC PANEL WITH GFR
ALT: 12 U/L (ref 0–44)
AST: 18 U/L (ref 15–41)
Albumin: 3.8 g/dL (ref 3.5–5.0)
Alkaline Phosphatase: 74 U/L (ref 38–126)
Anion gap: 9 (ref 5–15)
BUN: 8 mg/dL (ref 8–23)
CO2: 25 mmol/L (ref 22–32)
Calcium: 9 mg/dL (ref 8.9–10.3)
Chloride: 100 mmol/L (ref 98–111)
Creatinine, Ser: 0.81 mg/dL (ref 0.44–1.00)
GFR, Estimated: >60 mL/min (ref >60)
Glucose, Bld: 121 mg/dL — ABNORMAL HIGH (ref 70–99)
Potassium: 4 mmol/L (ref 3.5–5.1)
Sodium: 134 mmol/L — ABNORMAL LOW (ref 135–145)
Total Bilirubin: 0.4 mg/dL (ref 0.0–1.2)
Total Protein: 6.8 g/dL (ref 6.5–8.1)

## 2024-04-18 MED ORDER — DEXAMETHASONE 4 MG PO TABS: 4.0000 mg | ORAL_TABLET | Freq: Every day | ORAL | 6 refills | Status: AC

## 2024-04-18 NOTE — Progress Notes (Signed)
 [facility] OFFICE PROGRESS NOTE  Patient Care Team: Arloa Elsie SAUNDERS, MD as PCP - General (Family Medicine) [name] (Physician Assistant)  Assessment & Plan Malignant neoplasm of both ovaries [facility]) She has recurrent ovarian cancer, platinum sensitive High grade serous, p53 mutated, Neg genetics, HRD not detected, MSI stable, low TMB 2  CT imaging from April showed worsening disease and her treatment is switched to paclitaxel  Treatment course was complicated by intermittent pancytopenia, peripheral neuropathy and tooth infection CT imaging from July 2025 showed worsening disease control She has progressed on carboplatin , gemcitabine , bevacizumab  and most recently paclitaxel  The patient has made informed decision to stop treatment and focus on quality of life She had recent hospitalization with repeat imaging study showed significant disease progression The patient is still at peace with the idea of not receiving further systemic chemotherapy I plan to see her once a month for supportive care  Gastrointestinal hemorrhage with melena She had recent GI bleed secondary to tumor invasion Upper endoscopy evaluation on November 23 revealed large fungating and ulcerating mass with stigmata of recent bleeding found in the duodenal bulb She has no signs or symptoms of duodenal obstruction I reviewed her CBC today which shows stability of her blood count The patient desire blood transfusion support I plan to see her again in a month for further follow-up with repeat CBC and transfusion support if needed Anorexia She complained of anorexia and poor appetite I recommend trial of low-dose steroid and she agreed We discussed side effects to be expected  No orders of the defined types were placed in this encounter.    Yolanda Bedford, MD  INTERVAL HISTORY: she returns for surveillance follow-up after recent discharge from the hospital with GI bleed I reviewed CT  imaging, EGD report and pathology report She is asymptomatic Denies nausea She did complain of poor appetite and joint pain which is not new for her She denies recent signs of GI bleed such as melena or hematochezia I reviewed CBC result today We discussed risk and benefits of future transfusion support and she agreed  PHYSICAL EXAMINATION: ECOG PERFORMANCE STATUS: 1 - Symptomatic but completely ambulatory  Vitals:   04/18/24 1124  BP: 116/71  Pulse: 96  Resp: 18  Temp: 99.9 F (37.7 C)  SpO2: 100%   Filed Weights   04/18/24 1124  Weight: 157 lb (71.2 kg)    Relevant data reviewed during this visit included CBC, EGD report, pathology report, CT imaging

## 2024-04-18 NOTE — Assessment & Plan Note (Addendum)
 She had recent GI bleed secondary to tumor invasion Upper endoscopy evaluation on November 23 revealed large fungating and ulcerating mass with stigmata of recent bleeding found in the duodenal bulb She has no signs or symptoms of duodenal obstruction I reviewed her CBC today which shows stability of her blood count The patient desire blood transfusion support I plan to see her again in a month for further follow-up with repeat CBC and transfusion support if needed

## 2024-04-18 NOTE — Assessment & Plan Note (Addendum)
 She has recurrent ovarian cancer, platinum sensitive High grade serous, p53 mutated, Neg genetics, HRD not detected, MSI stable, low TMB 2  CT imaging from April showed worsening disease and her treatment is switched to paclitaxel  Treatment course was complicated by intermittent pancytopenia, peripheral neuropathy and tooth infection CT imaging from July 2025 showed worsening disease control She has progressed on carboplatin , gemcitabine , bevacizumab  and most recently paclitaxel  The patient has made informed decision to stop treatment and focus on quality of life She had recent hospitalization with repeat imaging study showed significant disease progression The patient is still at peace with the idea of not receiving further systemic chemotherapy I plan to see her once a month for supportive care

## 2024-04-18 NOTE — Assessment & Plan Note (Addendum)
 She complained of anorexia and poor appetite I recommend trial of low-dose steroid and she agreed We discussed side effects to be expected

## 2024-04-21 ENCOUNTER — Telehealth: Payer: Self-pay

## 2024-04-21 NOTE — Telephone Encounter (Signed)
 Returned her call and told her Decadron  Rx has been sent to [facility]. She verbalized understanding.

## 2024-04-23 ENCOUNTER — Telehealth: Payer: Self-pay

## 2024-04-23 ENCOUNTER — Other Ambulatory Visit: Payer: Self-pay

## 2024-04-23 DIAGNOSIS — K921 Melena: Secondary | ICD-10-CM

## 2024-04-23 DIAGNOSIS — C563 Malignant neoplasm of bilateral ovaries: Secondary | ICD-10-CM

## 2024-04-23 NOTE — Progress Notes (Signed)
 Lab orders placed for PF w/ lab appt tomorrow per Dr. Lanny.

## 2024-04-23 NOTE — Telephone Encounter (Signed)
 Enter in error

## 2024-04-23 NOTE — Telephone Encounter (Signed)
 This RN followed up with Ms. Pribyl regarding message received concerning Pt feeling increased weakness, SOB, hypotension, abdominal pain & distention, black stools, and abdominal pain with coughing. Pt informed this RN that weakness never went away after hospital stay in November, but that is has worsened over the last 2 days. Pt also informed this RN that she has been having diarrhea (2 episodes on 04/22/24 and 1 episode today) that is black in color. Pt denied stools being tarry. Pt denied bright red blood in stool. Ms. Coghlan informed this RN that she has been hypotensive with BP around 92/58 and HR in the upper 90s. This RN made Dr. Lanny aware. Dr. Lanny recommended Pt to come in for repeat labs today and possible blood transfusion. Dr. Lanny recommended Pt go to ED only if s/s worsen overnight d/t GI bleed being r/t metastatic cancer. This RN made Pt aware of provider's recommendations. Pt informed this RN that she would be unable to come in today for repeat labs d/t not having a ride. Per Ms. Southers she can come in on 04/24/24 for repeat labs and possible transfusion at 11:00 AM as she scheduled a ride through her insurance for 10:15 AM. This RN educated Pt on strict ED precautions. Pt expressed concerns about being alone tonight, this RN inquired about having someone stay with her, per the Pt no one is able to stay with her overnight tonight. This RN re-educated Pt on strict ED precautions and expressed to the Pt that we do not want anything to happen to her overnight. Pt verbalized understanding, and was agreeable with plan, Pt stated If my symptoms worsen I will call EMS, that is what I am worried about, but I will do it if I need too. This RN scheduled Pt at 11:00 AM for repeat labs. This RN made Dr. Lanny aware Pt to come in tomorrow on 04/23/2024.

## 2024-04-23 NOTE — Telephone Encounter (Signed)
 Pt. Called in stating that she has been really weak for about 2-3 days. Low blood pressure 98/66, 9957, 92/58. Black stools for about 2 days. Abdomen pain and harding. Says when she cough it hurts really bad and after it is very sore. Please advise.

## 2024-04-24 ENCOUNTER — Other Ambulatory Visit: Payer: Self-pay

## 2024-04-24 ENCOUNTER — Inpatient Hospital Stay

## 2024-04-24 ENCOUNTER — Observation Stay
Admission: EM | Admit: 2024-04-24 | Discharge: 2024-04-26 | Disposition: A | Attending: Family Medicine | Admitting: Family Medicine

## 2024-04-24 DIAGNOSIS — Z79899 Other long term (current) drug therapy: Secondary | ICD-10-CM | POA: Diagnosis not present

## 2024-04-24 DIAGNOSIS — G893 Neoplasm related pain (acute) (chronic): Secondary | ICD-10-CM | POA: Insufficient documentation

## 2024-04-24 DIAGNOSIS — R531 Weakness: Secondary | ICD-10-CM | POA: Diagnosis present

## 2024-04-24 DIAGNOSIS — M069 Rheumatoid arthritis, unspecified: Secondary | ICD-10-CM | POA: Diagnosis present

## 2024-04-24 DIAGNOSIS — I10 Essential (primary) hypertension: Secondary | ICD-10-CM | POA: Diagnosis present

## 2024-04-24 DIAGNOSIS — C7960 Secondary malignant neoplasm of unspecified ovary: Secondary | ICD-10-CM | POA: Insufficient documentation

## 2024-04-24 DIAGNOSIS — D649 Anemia, unspecified: Secondary | ICD-10-CM | POA: Diagnosis not present

## 2024-04-24 DIAGNOSIS — F419 Anxiety disorder, unspecified: Secondary | ICD-10-CM | POA: Diagnosis not present

## 2024-04-24 DIAGNOSIS — G35D Multiple sclerosis, unspecified: Secondary | ICD-10-CM | POA: Diagnosis present

## 2024-04-24 DIAGNOSIS — J449 Chronic obstructive pulmonary disease, unspecified: Secondary | ICD-10-CM | POA: Insufficient documentation

## 2024-04-24 DIAGNOSIS — Z87891 Personal history of nicotine dependence: Secondary | ICD-10-CM | POA: Insufficient documentation

## 2024-04-24 DIAGNOSIS — K921 Melena: Secondary | ICD-10-CM | POA: Diagnosis not present

## 2024-04-24 DIAGNOSIS — C563 Malignant neoplasm of bilateral ovaries: Secondary | ICD-10-CM | POA: Diagnosis present

## 2024-04-24 DIAGNOSIS — U071 COVID-19: Secondary | ICD-10-CM | POA: Diagnosis not present

## 2024-04-24 DIAGNOSIS — F909 Attention-deficit hyperactivity disorder, unspecified type: Secondary | ICD-10-CM | POA: Diagnosis not present

## 2024-04-24 LAB — BASIC METABOLIC PANEL WITH GFR
Anion gap: 13 (ref 5–15)
BUN: 11 mg/dL (ref 8–23)
CO2: 22 mmol/L (ref 22–32)
Calcium: 8.4 mg/dL — ABNORMAL LOW (ref 8.9–10.3)
Chloride: 96 mmol/L — ABNORMAL LOW (ref 98–111)
Creatinine, Ser: 0.74 mg/dL (ref 0.44–1.00)
GFR, Estimated: >60 mL/min (ref >60)
Glucose, Bld: 140 mg/dL — ABNORMAL HIGH (ref 70–99)
Potassium: 3.8 mmol/L (ref 3.5–5.1)
Sodium: 131 mmol/L — ABNORMAL LOW (ref 135–145)

## 2024-04-24 LAB — URINALYSIS, ROUTINE W REFLEX MICROSCOPIC
Bilirubin Urine: NEGATIVE
Glucose, UA: NEGATIVE mg/dL
Ketones, ur: NEGATIVE mg/dL
Leukocytes,Ua: NEGATIVE
Nitrite: NEGATIVE
Protein, ur: NEGATIVE mg/dL
Specific Gravity, Urine: 1.004 — ABNORMAL LOW (ref 1.005–1.030)
pH: 6 (ref 5.0–8.0)

## 2024-04-24 LAB — HEPATIC FUNCTION PANEL
ALT: 10 U/L (ref 0–44)
AST: 18 U/L (ref 15–41)
Albumin: 3.1 g/dL — ABNORMAL LOW (ref 3.5–5.0)
Alkaline Phosphatase: 56 U/L (ref 38–126)
Bilirubin, Direct: 0.1 mg/dL (ref 0.0–0.2)
Indirect Bilirubin: 0.2 mg/dL — ABNORMAL LOW (ref 0.3–0.9)
Total Bilirubin: 0.3 mg/dL (ref 0.0–1.2)
Total Protein: 6.5 g/dL (ref 6.5–8.1)

## 2024-04-24 LAB — CBC WITH DIFFERENTIAL/PLATELET
Abs Immature Granulocytes: 0.06 K/uL (ref 0.00–0.07)
Basophils Absolute: 0 K/uL (ref 0.0–0.1)
Basophils Relative: 0 %
Eosinophils Absolute: 0.1 K/uL (ref 0.0–0.5)
Eosinophils Relative: 1 %
HCT: 22.9 % — ABNORMAL LOW (ref 36.0–46.0)
Hemoglobin: 7.2 g/dL — ABNORMAL LOW (ref 12.0–15.0)
Immature Granulocytes: 1 %
Lymphocytes Relative: 13 %
Lymphs Abs: 1.4 K/uL (ref 0.7–4.0)
MCH: 29.1 pg (ref 26.0–34.0)
MCHC: 31.4 g/dL (ref 30.0–36.0)
MCV: 92.7 fL (ref 80.0–100.0)
Monocytes Absolute: 1.6 K/uL — ABNORMAL HIGH (ref 0.1–1.0)
Monocytes Relative: 15 %
Neutro Abs: 7.6 K/uL (ref 1.7–7.7)
Neutrophils Relative %: 70 %
Platelets: 339 K/uL (ref 150–400)
RBC: 2.47 MIL/uL — ABNORMAL LOW (ref 3.87–5.11)
RDW: 15.3 % (ref 11.5–15.5)
WBC: 10.7 K/uL — ABNORMAL HIGH (ref 4.0–10.5)
nRBC: 0 % (ref 0.0–0.2)

## 2024-04-24 LAB — PREPARE RBC (CROSSMATCH)

## 2024-04-24 LAB — LIPASE, BLOOD: Lipase: 32 U/L (ref 11–51)

## 2024-04-24 MED ORDER — OXYCODONE HCL 5 MG PO TABS
20.0000 mg | ORAL_TABLET | Freq: Every day | ORAL | Status: DC | PRN
  Administered 2024-04-24 – 2024-04-25 (×2): 20 mg via ORAL
  Filled 2024-04-24 (×2): qty 4

## 2024-04-24 MED ORDER — PANTOPRAZOLE SODIUM 40 MG PO TBEC: 40.0000 mg | DELAYED_RELEASE_TABLET | Freq: Every day | ORAL | Status: DC

## 2024-04-24 MED ORDER — MELATONIN 5 MG PO TABS
5.0000 mg | ORAL_TABLET | Freq: Every evening | ORAL | Status: DC | PRN
  Filled 2024-04-24 (×2): qty 1

## 2024-04-24 MED ORDER — IPRATROPIUM-ALBUTEROL 0.5-2.5 (3) MG/3ML IN SOLN: 3.0000 mL | RESPIRATORY_TRACT | Status: DC | PRN

## 2024-04-24 MED ORDER — OXYCODONE HCL ER 15 MG PO T12A
15.0000 mg | EXTENDED_RELEASE_TABLET | Freq: Two times a day (BID) | ORAL | Status: DC
  Administered 2024-04-24 – 2024-04-26 (×4): 15 mg via ORAL
  Filled 2024-04-24 (×4): qty 1

## 2024-04-24 MED ORDER — ACETAMINOPHEN 650 MG RE SUPP: 650.0000 mg | Freq: Four times a day (QID) | RECTAL | Status: DC | PRN

## 2024-04-24 MED ORDER — ONDANSETRON HCL 4 MG PO TABS: 4.0000 mg | ORAL_TABLET | Freq: Four times a day (QID) | ORAL | Status: DC | PRN

## 2024-04-24 MED ORDER — ACETAMINOPHEN 325 MG PO TABS
650.0000 mg | ORAL_TABLET | Freq: Four times a day (QID) | ORAL | Status: DC | PRN
  Administered 2024-04-24 – 2024-04-25 (×2): 650 mg via ORAL
  Filled 2024-04-24 (×2): qty 2

## 2024-04-24 MED ORDER — SODIUM CHLORIDE 0.9 % IV SOLN
10.0000 mL/h | Freq: Once | INTRAVENOUS | Status: AC
  Administered 2024-04-24: 16:00:00 10 mL/h via INTRAVENOUS

## 2024-04-24 MED ORDER — ONDANSETRON HCL 4 MG/2ML IJ SOLN
4.0000 mg | Freq: Four times a day (QID) | INTRAMUSCULAR | Status: DC | PRN
  Administered 2024-04-25: 4 mg via INTRAVENOUS
  Filled 2024-04-24: qty 2

## 2024-04-24 MED ORDER — ONDANSETRON HCL 4 MG/2ML IJ SOLN
4.0000 mg | Freq: Once | INTRAMUSCULAR | Status: AC
  Administered 2024-04-24: 14:00:00 4 mg via INTRAVENOUS
  Filled 2024-04-24: qty 2

## 2024-04-24 MED ORDER — HYDROMORPHONE HCL 1 MG/ML IJ SOLN
1.0000 mg | Freq: Once | INTRAMUSCULAR | Status: AC
  Administered 2024-04-24: 14:00:00 1 mg via INTRAVENOUS
  Filled 2024-04-24: qty 1

## 2024-04-24 MED ORDER — OXYCODONE HCL ER 15 MG PO T12A: 15.0000 mg | EXTENDED_RELEASE_TABLET | Freq: Two times a day (BID) | ORAL | Status: DC

## 2024-04-24 MED ORDER — POLYETHYLENE GLYCOL 3350 17 G PO PACK
17.0000 g | PACK | Freq: Every day | ORAL | Status: DC | PRN
  Administered 2024-04-25: 17 g via ORAL
  Filled 2024-04-24 (×2): qty 1

## 2024-04-24 MED ORDER — PANTOPRAZOLE SODIUM 40 MG PO TBEC
40.0000 mg | DELAYED_RELEASE_TABLET | Freq: Two times a day (BID) | ORAL | Status: DC
  Administered 2024-04-24 – 2024-04-26 (×4): 40 mg via ORAL
  Filled 2024-04-24 (×4): qty 1

## 2024-04-24 MED ORDER — SODIUM CHLORIDE 0.9 % IV BOLUS
1000.0000 mL | Freq: Once | INTRAVENOUS | Status: AC
  Administered 2024-04-24: 14:00:00 1000 mL via INTRAVENOUS

## 2024-04-24 MED ORDER — MIRTAZAPINE 15 MG PO TABS
7.5000 mg | ORAL_TABLET | Freq: Every day | ORAL | Status: DC
  Administered 2024-04-24 – 2024-04-25 (×2): 7.5 mg via ORAL
  Filled 2024-04-24 (×2): qty 1

## 2024-04-24 MED ORDER — LORAZEPAM 0.5 MG PO TABS
0.5000 mg | ORAL_TABLET | Freq: Three times a day (TID) | ORAL | Status: DC | PRN
  Administered 2024-04-24: 20:00:00 0.5 mg via ORAL
  Filled 2024-04-24: qty 1

## 2024-04-24 NOTE — ED Provider Notes (Signed)
 [facility] Provider Note    Event Date/Time   First MD Initiated Contact with Patient 04/24/24 1314     (approximate)   History   Abdominal Pain, Weakness, and GI Bleeding   HPI  [name] is a 68 y.o. female with a history of ovarian cancer who presents with generalized weakness, worse over the last 4 days, persistent course, now feeling weak enough that she can barely walk.  She reports it feels similar to when she was recently anemic from a GI bleed.  She reports upper abdominal pain which is persistent and unchanged, in which she relates to her cancer.  She has had some intermittent diarrhea and states that her stools have been dark.  She reports nausea but no vomiting.  I reviewed the past medical records.  The patient was evaluated by Dr. Lova at the [facility] on 12/12 for follow-up of her ovarian cancer.  The patient is no longer receiving systemic chemotherapy.  She is admitted in November with an acute upper GI bleed found to be due to cancer invading the small bowel.   Physical Exam   Triage Vital Signs: ED Triage Vitals [04/24/24 1313]  Encounter Vitals Group     BP      Girls Systolic BP Percentile      Girls Diastolic BP Percentile      Boys Systolic BP Percentile      Boys Diastolic BP Percentile      Pulse      Resp      Temp      Temp src      SpO2 98 %     Weight      Height      Head Circumference      Peak Flow      Pain Score      Pain Loc      Pain Education      Exclude from Growth Chart     Most recent vital signs: Vitals:   04/24/24 1313 04/24/24 1317  BP:  127/71  Pulse:  95  Resp:  17  Temp:  98.4 F (36.9 C)  SpO2: 98% 100%     General: Awake, no distress.  CV:  Good peripheral perfusion.  Resp:  Normal effort.  Abd:  Soft with mild epigastric discomfort.  No distention.  Other:  Conjunctival pallor.   ED Results / Procedures / Treatments   Labs (all labs ordered are  listed, but only abnormal results are displayed) Labs Reviewed  CBC WITH DIFFERENTIAL/PLATELET - Abnormal; Notable for the following components:      Result Value   WBC 10.7 (*)    RBC 2.47 (*)    Hemoglobin 7.2 (*)    HCT 22.9 (*)    Monocytes Absolute 1.6 (*)    All other components within normal limits  BASIC METABOLIC PANEL WITH GFR - Abnormal; Notable for the following components:   Sodium 131 (*)    Chloride 96 (*)    Glucose, Bld 140 (*)    Calcium 8.4 (*)    All other components within normal limits  HEPATIC FUNCTION PANEL - Abnormal; Notable for the following components:   Albumin  3.1 (*)    Indirect Bilirubin 0.2 (*)    All other components within normal limits  URINALYSIS, ROUTINE W REFLEX MICROSCOPIC - Abnormal; Notable for the following components:   Color, Urine STRAW (*)    APPearance CLEAR (*)  Specific Gravity, Urine 1.004 (*)    Hgb urine dipstick MODERATE (*)    Bacteria, UA RARE (*)    All other components within normal limits  LIPASE, BLOOD  TYPE AND SCREEN  PREPARE RBC (CROSSMATCH)     EKG   RADIOLOGY   PROCEDURES:  Critical Care performed: No  Procedures   MEDICATIONS ORDERED IN ED: Medications  0.9 %  sodium chloride  infusion (has no administration in time range)  sodium chloride  0.9 % bolus 1,000 mL (1,000 mLs Intravenous New Bag/Given 04/24/24 1357)  HYDROmorphone  (DILAUDID ) injection 1 mg (1 mg Intravenous Given 04/24/24 1355)  ondansetron  (ZOFRAN ) injection 4 mg (4 mg Intravenous Given 04/24/24 1356)     IMPRESSION / MDM / ASSESSMENT AND PLAN / ED COURSE  I reviewed the triage vital signs and the nursing notes.  68 year old female with PMH as noted above presents with worsening generalized weakness over the last 4 days associated with dark stools, nausea, and upper abdominal pain.  Patient was recently admitted for GI bleed related to a cancerous mass involving her intestine.  On exam her vital signs are normal.  She appears  somewhat pale and weak but in no distress.  Differential diagnosis includes, but is not limited to, recurrent GI bleed, anemia, electrolyte abnormality, AKI, other metabolic cause, or worsening symptoms related to her cancer.  There is no clinical evidence of a bowel obstruction or other acute surgical emergency.  At this time there is no indication for imaging.  We will obtain lab workup, give analgesia and fluids, and reassess.  Patient's presentation is most consistent with acute presentation with potential threat to life or bodily function.  The patient is on the cardiac monitor to evaluate for evidence of arrhythmia and/or significant heart rate changes.   ----------------------------------------- 4:00 PM on 04/24/2024 -----------------------------------------  Hemoglobin is 7.2.  BMP shows no acute findings.  Some of the patient's weakness may be due to symptomatic anemia, versus just weakness related to her cancer.  I have ordered a unit of PRBCs due to the symptomatic anemia.  I offered the patient transfusion in the ED and discharge versus admission and she would prefer to stay in the hospital due to her weakness.  I consulted the hospitalist; based on our discussion she agrees to evaluate the patient for admission.   FINAL CLINICAL IMPRESSION(S) / ED DIAGNOSES   Final diagnoses:  Weakness  Symptomatic anemia     Rx / DC Orders   ED Discharge Orders     None        Note:  This document was prepared using Dragon voice recognition software and may include unintentional dictation errors.    Jacolyn Pae, MD 04/24/24 [phone]

## 2024-04-24 NOTE — Plan of Care (Signed)

## 2024-04-24 NOTE — ED Notes (Signed)
 This RN called lab to check status on blood work

## 2024-04-24 NOTE — ED Triage Notes (Addendum)
 Pt arrives from home via [facility] with c/o abdominal pain in the RUQ that has been going since they had a GI bleed 3 weeks ago, where they received a blood transfusion. Pt states that they had dark watery stools 2 days ago. And has been feeling week for 3-4 days. Pt has stage IV ovarian cancer and stopped treatment 6 months ago. Pt is A&Ox4 during triage.

## 2024-04-24 NOTE — Telephone Encounter (Signed)
 Hi [name],  I will be in touch once her labs are back All her symtpoms are from her cancer growing I saw her recently and talked about this She needs hospice

## 2024-04-24 NOTE — H&P (Signed)
 History and Physical    [name] FMW:[id number] DOB: [birth year] DOA: 04/24/2024  DOS: the patient was seen and examined on 04/24/2024  PCP: Arloa Elsie SAUNDERS, MD   Patient coming from: Home  I have personally briefly reviewed patient's old medical records in [facility] and CareEverywhere  HPI:  [name] is a 68 y.o. year old female with past medical history of depression, endometriosis, metastatic ovarian cancer (no longer undergoing treatment), RA, GERD, syncope, hypertension, multiple sclerosis.  She presents to [facility] regional ED with a 3 to 4-day history of worsening generalized weakness and ongoing dark, melanotic stools since recent admission in November for GI bleed.  She endorses abdominal pain related to her cancer.  Denies dizziness, shortness of breath, or pallor   ED Course: On arrival to [facility] regional ED patient was noted to be afebrile temp 36.9 C,  BP 06/03/1969, HR 95, RR 17, SpO2 100% room air. Labs notable for WBC 10.7, hemoglobin 7.2, sodium 131, glucose 140, albumin  3.1.  She was given Dilaudid , Zofran , and a 1 L normal saline bolus.  1 unit PRBCs ordered. [facility] contacted for admission.     Review of Systems: As mentioned in the history of present illness. All other systems reviewed and are negative.    Past Medical History:  Diagnosis Date   Anxiety    Asthma    Back problem    disc disease, chronic low back pain, right leg pain, s/p discectomy 99   Cancer (HCC)    ovarian cancer   Depression    Diastolic dysfunction    with elevated LVEDP at cath   Endometriosis    Fibroids    hx of    GERD (gastroesophageal reflux disease)    Heart murmur    Hepatitis C    treated 8-9 years ago   Hidradenitis suppurativa    History of kidney stones    Hypertension    MS (multiple sclerosis)    Neuromuscular disorder (HCC)    Mulitple sclerosis   Osteoarthritis    Pre-diabetes    Prediabetes    RA (rheumatoid arthritis) (HCC)     Rotator cuff tear    right shoulder    Syncope     Past Surgical History:  Procedure Laterality Date   ANGIOPLASTY     BACK SURGERY     lumbar surgery x 2 done in [place]  and [place]   COLON RESECTION SIGMOID  08/16/2022   Procedure: COLON RESECTION SIGMOID;  Surgeon: Viktoria Comer SAUNDERS, MD;  Location: [facility];  Service: Gynecology;;   COLONOSCOPY  07/2010   tubular adenoma   COLONOSCOPY  08/2015   CYSTOSCOPY N/A 08/16/2022   Procedure: CYSTOSCOPY;  Surgeon: Viktoria Comer SAUNDERS, MD;  Location: [facility];  Service: Gynecology;  Laterality: N/A;   DISKECTOMY     ESOPHAGOGASTRODUODENOSCOPY N/A 03/30/2024   Procedure: EGD (ESOPHAGOGASTRODUODENOSCOPY);  Surgeon: Maryruth Ole DASEN, MD;  Location: [facility] ENDOSCOPY;  Service: Endoscopy;  Laterality: N/A;   HYSTERECTOMY ABDOMINAL WITH SALPINGO-OOPHORECTOMY  08/16/2022   Procedure: HYSTERECTOMY ABDOMINAL WITH SALPINGO-OOPHORECTOMY;  Surgeon: Viktoria Comer SAUNDERS, MD;  Location: [facility];  Service: Gynecology;;   IR IMAGING GUIDED PORT INSERTION  05/18/2022   IR IMAGING GUIDED PORT INSERTION  07/03/2023   IR REMOVAL TUN ACCESS W/ PORT W/O FL MOD SED  12/20/2022   IR US  GUIDE BX ASP/DRAIN  05/18/2022   LAPAROSCOPY N/A 08/16/2022   Procedure: LAPAROSCOPY DIAGNOSTIC;  Surgeon: Viktoria Comer SAUNDERS, MD;  Location: [facility];  Service: Gynecology;  Laterality: N/A;   laparoscopy for endometriosis     left shoulder scope     RADIOLOGY WITH ANESTHESIA N/A 05/18/2022   Procedure: IR WITH ANESTHESIA PORT AND BIOPSY;  Surgeon: Luverne Aran, MD;  Location: [facility];  Service: Radiology;  Laterality: N/A;   ROTATOR CUFF REPAIR Left    Spinal Fusion  12/2016   L4-S1     reports that she quit smoking about 2 years ago. Her smoking use included cigarettes. She started smoking about 29 years ago. She has a 2.7 pack-year smoking history. She has never used smokeless tobacco. She reports current alcohol use. She reports current drug use. Drug:  Marijuana.  Allergies[1]  Family History  Problem Relation Age of Onset   Heart disease Mother        Died with MI 50, chest pain 30s   Hypertension Mother    Pancreatitis Father    Atrial fibrillation Sister    Breast cancer Maternal Aunt        dx <50   Stomach cancer Maternal Grandfather        mets to liver? dx after 50   Multiple sclerosis Cousin    Breast cancer Cousin        mat female cousin; dx unknown age   Colon cancer Cousin 57       mat female cousin; mets   Leukemia Cousin 49       mat female cousin    Prior to Admission medications  Medication Sig Start Date End Date Taking? Authorizing Provider  albuterol  (VENTOLIN  HFA) 108 (90 Base) MCG/ACT inhaler Inhale 2 puffs into the lungs every 6 (six) hours as needed for shortness of breath. 07/30/21   [provider]  amphetamine -dextroamphetamine  (ADDERALL ) 20 MG tablet Take 10-20 mg by mouth See admin instructions. Take 20 mg by mouth in the morning and 10 mg between 1-2 PM daily    [provider]  Ascorbic Acid (VITAMIN C) 1000 MG tablet Take 1,000 mg by mouth daily.    [provider]  ASHWAGANDHA PO Take 1 capsule by mouth daily.    [provider]  [Paused] atenolol  (TENORMIN ) 25 MG tablet TAKE 1 TABLET (25 MG TOTAL) BY MOUTH DAILY. Wait to take this until your doctor or other care provider tells you to start again. 03/06/24   Lonn Hicks, MD  baclofen  (LIORESAL ) 10 MG tablet Take 10 mg by mouth 3 (three) times daily as needed.    [provider]  BLACK CURRANT SEED OIL PO Take 1 capsule by mouth daily.    [provider]  dexamethasone  (DECADRON ) 4 MG tablet Take 1 tablet (4 mg total) by mouth daily. 04/18/24   Lonn Hicks, MD  hydroxychloroquine (PLAQUENIL) 200 MG tablet Take 200 mg by mouth 2 (two) times daily. 01/23/24   [provider]  [Paused] irbesartan  (AVAPRO ) 300 MG tablet Take 1 tablet by mouth daily. Wait to take this until your doctor or other  care provider tells you to start again. 12/08/22   [provider]  lidocaine -prilocaine  (EMLA ) cream Apply to affected area once 06/25/23   Lonn Hicks, MD  LORazepam  (ATIVAN ) 0.5 MG tablet Take 0.5 mg by mouth 3 (three) times daily as needed for anxiety.    [provider]  Multiple Vitamin (MULTIVITAMIN WITH MINERALS) TABS tablet Take 1 tablet by mouth daily.    [provider]  naloxone  (NARCAN ) nasal spray 4 mg/0.1 mL Place 1  spray into the nose as needed (opioid overdose).    [provider]  omeprazole  (PRILOSEC) 20 MG capsule TAKE 1 CAPSULE BY MOUTH EVERY DAY 03/24/24   Lonn Hicks, MD  ondansetron  (ZOFRAN ) 8 MG tablet Take 1 tablet (8 mg total) by mouth every 8 (eight) hours as needed for nausea or vomiting. 02/29/24   Lonn Hicks, MD  OVER THE COUNTER MEDICATION Take 2 capsules by mouth daily. [place] advanced supplement    [provider]  oxyCODONE  ER (XTAMPZA  ER) 13.5 MG C12A Take 13.5 mg by mouth in the morning, at noon, and at bedtime.    [provider]  Oxycodone  HCl 20 MG TABS Take 20 mg by mouth 5 (five) times daily as needed (pain).    [provider]  polyethylene glycol (MIRALAX  / GLYCOLAX ) 17 g packet Take 17 g by mouth daily.    [provider]  prochlorperazine  (COMPAZINE ) 10 MG tablet Take 1 tablet (10 mg total) by mouth every 6 (six) hours as needed for nausea or vomiting. 02/29/24   Lonn Hicks, MD  Vitamin D, Ergocalciferol, (DRISDOL) 1.25 MG (50000 UNIT) CAPS capsule Take 50,000 Units by mouth every Sunday.    [provider]  NAPOLEON INHUB 250-50 MCG/ACT AEPB Inhale 1 puff into the lungs daily.    [provider]    Physical Exam: Vitals:   04/24/24 1313 04/24/24 1316 04/24/24 1317  BP:   127/71  Pulse:   95  Resp:   17  Temp:   98.4 F (36.9 C)  TempSrc:   Oral  SpO2: 98%  100%  Weight:  70.3 kg   Height:  5' 9 (1.753 m)     Physical Exam Vitals and nursing note  reviewed.  HENT:     Head: Normocephalic.  Cardiovascular:     Rate and Rhythm: Normal rate and regular rhythm.     Pulses: Normal pulses.     Heart sounds: Normal heart sounds.  Pulmonary:     Effort: Pulmonary effort is normal.  Abdominal:     General: Abdomen is flat. Bowel sounds are normal.     Palpations: Abdomen is soft.     Tenderness: There is abdominal tenderness.  Skin:    General: Skin is warm and dry.     Capillary Refill: Capillary refill takes less than 2 seconds.  Neurological:     General: No focal deficit present.     Mental Status: She is alert and oriented to person, place, and time.       Labs on Admission: I have personally reviewed following labs and imaging studies  CBC: Recent Labs  Lab 04/18/24 1059 04/24/24 1323  WBC 9.0 10.7*  NEUTROABS 6.4 7.6  HGB 8.6* 7.2*  HCT 26.2* 22.9*  MCV 93.2 92.7  PLT 342 339   Basic Metabolic Panel: Recent Labs  Lab 04/18/24 1059 04/24/24 1323  NA 134* 131*  K 4.0 3.8  CL 100 96*  CO2 25 22  GLUCOSE 121* 140*  BUN 8 11  CREATININE 0.81 0.74  CALCIUM 9.0 8.4*   GFR: Estimated Creatinine Clearance: 73.3 mL/min (by C-G formula based on SCr of 0.74 mg/dL). Liver Function Tests: Recent Labs  Lab 04/18/24 1059 04/24/24 1323  AST 18 18  ALT 12 10  ALKPHOS 74 56  BILITOT 0.4 0.3  PROT 6.8 6.5  ALBUMIN  3.8 3.1*   Recent Labs  Lab 04/24/24 1323  LIPASE 32   No results for input(s): AMMONIA in  the last 168 hours. Coagulation Profile: No results for input(s): INR, PROTIME in the last 168 hours. Cardiac Enzymes: No results for input(s): CKTOTAL, CKMB, CKMBINDEX, TROPONINI, TROPONINIHS in the last 168 hours. BNP (last 3 results) No results for input(s): BNP in the last 8760 hours. HbA1C: No results for input(s): HGBA1C in the last 72 hours. CBG: No results for input(s): GLUCAP in the last 168 hours. Lipid Profile: No results for input(s): CHOL, HDL, LDLCALC, TRIG,  CHOLHDL, LDLDIRECT in the last 72 hours. Thyroid  Function Tests: No results for input(s): TSH, T4TOTAL, FREET4, T3FREE, THYROIDAB in the last 72 hours. Anemia Panel: No results for input(s): VITAMINB12, FOLATE, FERRITIN, TIBC, IRON, RETICCTPCT in the last 72 hours. Urine analysis:    Component Value Date/Time   COLORURINE STRAW (A) 04/24/2024 1508   APPEARANCEUR CLEAR (A) 04/24/2024 1508   LABSPEC 1.004 (L) 04/24/2024 1508   PHURINE 6.0 04/24/2024 1508   GLUCOSEU NEGATIVE 04/24/2024 1508   HGBUR MODERATE (A) 04/24/2024 1508   BILIRUBINUR NEGATIVE 04/24/2024 1508   KETONESUR NEGATIVE 04/24/2024 1508   PROTEINUR NEGATIVE 04/24/2024 1508   NITRITE NEGATIVE 04/24/2024 1508   LEUKOCYTESUR NEGATIVE 04/24/2024 1508    Radiological Exams on Admission: I have personally reviewed images No results found.   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 Lonn outpatient, at this time, not pursuing additional treatment for her cancer (see 02/29/2024 note from oncology)  - Palliative care consulted - PDMP reviewed, continue home twice daily Xtampza  or formulary equivalent and IR oxycodone  5 times daily  Hypertension - Patient not currently taking any outpatient medication  COPD - Continue outpatient inhalers  ADHD - Hold home Adderall   Rheumatoid arthritis - Not on outpatient medication  Multiple sclerosis - Not on outpatient medication  Anxiety - PDMP reviewed, continue home Ativan     VTE prophylaxis:  SCDs  GI prophylaxis: Protonix  Diet: Regular Access: PIV Lines: Port Code Status:  Full Code Telemetry: No  Admission status:  Observation, Med-Surg Patient is from: Home Anticipated d/c is to: Home Anticipated d/c date is: 1 to 2 days Patient currently: Receiving 1 unit PRBC  Family Communication: Sister at bedside  Consults called: N/A    Severity of Illness: The appropriate patient status for this patient is OBSERVATION. Observation status is judged to be reasonable and necessary in order to provide the required intensity of service to ensure the patient's safety. The patient's presenting symptoms, physical exam findings, and initial radiographic and laboratory data in the context of their medical condition is felt to place them at decreased risk for further clinical deterioration. Furthermore, it is anticipated that the patient will be medically stable for discharge from the hospital within 2 midnights of admission.   To reach the provider On-Call:   7AM- 7PM see care teams to locate the attending and reach out to them via [email or web address] Password: [email or web address] 7PM-7AM contact night-coverage If you still have difficulty reaching the appropriate provider, please page the [facility] (Director on Call) for [facility] on amion for assistance  This document was prepared using Conservation officer, historic buildings and may include unintentional dictation errors.  Rockie Rams FNP-BC, PMHNP-BC Nurse Practitioner [facility]      [  1]  Allergies Allergen Reactions   Cymbalta [Duloxetine Hcl] Shortness Of Breath, Swelling and Other (See Comments)    Tongue and leg swelling   Cyclobenzaprine Hcl Other (See Comments)    Immobility   Celexa  [Citalopram Hydrobromide] Swelling and Other (See Comments)    Tongue swelling   Fluticasone  Furoate-Vilanterol Swelling and Other (See Comments)    BREO ELLIPTA - Tongue swelling, but breathing not affected   Gabapentin Other (See Comments)    Feels awful when taking, drowsy    Opana  [Oxymorphone Hcl] Itching and Other (See Comments)    Loss of hair, also    Tramadol Other (See Comments)    Dizziness

## 2024-04-24 NOTE — ED Notes (Signed)
 Ice chips given with okay from MD

## 2024-04-24 NOTE — Hospital Course (Addendum)
 [name] is a 68 y.o. year old female with past medical history of depression, endometriosis, metastatic ovarian cancer (no longer undergoing treatment), RA, GERD, syncope, hypertension, multiple sclerosis.  She presents to [facility] regional ED with a 3 to 4-day history of worsening generalized weakness and ongoing dark, melanotic stools since recent admission in November for GI bleed.  She endorses abdominal pain related to her cancer.  Denies dizziness, shortness of breath, or pallor   ED Course: On arrival to [facility] regional ED patient was noted to be afebrile temp 36.9 C,  BP 06/03/1969, HR 95, RR 17, SpO2 100% room air. Labs notable for WBC 10.7, hemoglobin 7.2, sodium 131, glucose 140, albumin  3.1.  She was given Dilaudid , Zofran , and a 1 L normal saline bolus.  1 unit PRBCs ordered. [facility] contacted for admission.

## 2024-04-24 NOTE — Telephone Encounter (Signed)
 Called to check on her due to not showing up for appts today. She is not coming because she does not have a ride. She said she left a message on [facility] line. She is complaining of not feeling well and the same symptoms as yesterday. Instructed to go to the ER to be evaluated now. She verbalized understanding and will get her sister to take her to the ER in [place].

## 2024-04-25 DIAGNOSIS — D649 Anemia, unspecified: Secondary | ICD-10-CM | POA: Diagnosis not present

## 2024-04-25 DIAGNOSIS — F419 Anxiety disorder, unspecified: Secondary | ICD-10-CM | POA: Diagnosis not present

## 2024-04-25 DIAGNOSIS — M069 Rheumatoid arthritis, unspecified: Secondary | ICD-10-CM | POA: Diagnosis not present

## 2024-04-25 DIAGNOSIS — C563 Malignant neoplasm of bilateral ovaries: Secondary | ICD-10-CM | POA: Diagnosis not present

## 2024-04-25 DIAGNOSIS — U071 COVID-19: Secondary | ICD-10-CM | POA: Insufficient documentation

## 2024-04-25 LAB — RESP PANEL BY RT-PCR (RSV, FLU A&B, COVID)  RVPGX2
Influenza A by PCR: NEGATIVE
Influenza B by PCR: NEGATIVE
Resp Syncytial Virus by PCR: NEGATIVE
SARS Coronavirus 2 by RT PCR: POSITIVE — AB

## 2024-04-25 LAB — TYPE AND SCREEN
ABO/RH(D): O POS
Antibody Screen: NEGATIVE
Unit division: 0

## 2024-04-25 LAB — RESPIRATORY PANEL BY PCR

## 2024-04-25 LAB — CBC
HCT: 23.3 % — ABNORMAL LOW (ref 36.0–46.0)
Hemoglobin: 7.5 g/dL — ABNORMAL LOW (ref 12.0–15.0)
MCH: 29 pg (ref 26.0–34.0)
MCHC: 32.2 g/dL (ref 30.0–36.0)
MCV: 90 fL (ref 80.0–100.0)
Platelets: 295 K/uL (ref 150–400)
RBC: 2.59 MIL/uL — ABNORMAL LOW (ref 3.87–5.11)
RDW: 16.6 % — ABNORMAL HIGH (ref 11.5–15.5)
WBC: 10.7 K/uL — ABNORMAL HIGH (ref 4.0–10.5)
nRBC: 0 % (ref 0.0–0.2)

## 2024-04-25 LAB — BPAM RBC
Blood Product Expiration Date: 202601212359
ISSUE DATE / TIME: 202512181805
Unit Type and Rh: 5100

## 2024-04-25 LAB — BASIC METABOLIC PANEL WITH GFR
Anion gap: 8 (ref 5–15)
BUN: 8 mg/dL (ref 8–23)
CO2: 25 mmol/L (ref 22–32)
Calcium: 8 mg/dL — ABNORMAL LOW (ref 8.9–10.3)
Chloride: 101 mmol/L (ref 98–111)
Creatinine, Ser: 0.75 mg/dL (ref 0.44–1.00)
GFR, Estimated: >60 mL/min
Glucose, Bld: 125 mg/dL — ABNORMAL HIGH (ref 70–99)
Potassium: 3.7 mmol/L (ref 3.5–5.1)
Sodium: 135 mmol/L (ref 135–145)

## 2024-04-25 MED ORDER — OXYCODONE HCL 5 MG PO TABS
20.0000 mg | ORAL_TABLET | Freq: Every day | ORAL | Status: DC
  Administered 2024-04-25 – 2024-04-26 (×6): 20 mg via ORAL
  Filled 2024-04-25 (×6): qty 4

## 2024-04-25 MED ORDER — ENSURE PLUS HIGH PROTEIN PO LIQD
237.0000 mL | Freq: Two times a day (BID) | ORAL | Status: DC
  Administered 2024-04-25 – 2024-04-26 (×3): 237 mL via ORAL

## 2024-04-25 NOTE — Plan of Care (Signed)
  Problem: Education: Goal: Knowledge of General Education information will improve Description: Including pain rating scale, medication(s)/side effects and non-pharmacologic comfort measures Outcome: Progressing   Problem: Health Behavior/Discharge Planning: Goal: Ability to manage health-related needs will improve Outcome: Progressing   Problem: Clinical Measurements: Goal: Ability to maintain clinical measurements within normal limits will improve Outcome: Progressing Goal: Will remain free from infection Outcome: Progressing Goal: Diagnostic test results will improve Outcome: Progressing Goal: Respiratory complications will improve Outcome: Progressing Goal: Cardiovascular complication will be avoided Outcome: Progressing   Problem: Activity: Goal: Risk for activity intolerance will decrease Outcome: Progressing   Problem: Nutrition: Goal: Adequate nutrition will be maintained Outcome: Progressing   Problem: Coping: Goal: Level of anxiety will decrease Outcome: Progressing   Problem: Elimination: Goal: Will not experience complications related to bowel motility Outcome: Progressing Goal: Will not experience complications related to urinary retention Outcome: Progressing   Problem: Pain Managment: Goal: General experience of comfort will improve and/or be controlled Outcome: Progressing   Problem: Safety: Goal: Ability to remain free from injury will improve Outcome: Progressing   Problem: Skin Integrity: Goal: Risk for impaired skin integrity will decrease Outcome: Progressing   Problem: Education: Goal: Knowledge of risk factors and measures for prevention of condition will improve Outcome: Progressing   Problem: Coping: Goal: Psychosocial and spiritual needs will be supported Outcome: Progressing   Problem: Respiratory: Goal: Will maintain a patent airway Outcome: Progressing Goal: Complications related to the disease process, condition or treatment  will be avoided or minimized Outcome: Progressing

## 2024-04-25 NOTE — Progress Notes (Signed)
 " PROGRESS NOTE    [name]  FMW:[id number] DOB: 1955/06/15 DOA: 04/24/2024 PCP: Arloa Elsie SAUNDERS, MD  Chief Complaint  Patient presents with   Abdominal Pain   Weakness   GI Bleeding    Hospital Course:  [name] is a 68 year-old female with depression, endometriosis, rheumatoid arthritis, GERD, hypertension, multiple sclerosis, metastatic ovarian cancer unresponsive to treatment.  Patient's ovarian cancer has spread and she had recent endoscopy in November which revealed large fungating and ulcerating mass with stigmata of recent bleeding in the duodenal bulb.  She presents to the ED today for progressive weakness, melena, and acute anemia secondary to this source. Ultimately the patient is transitioning to hospice and is set up for outpatient hospice evaluation on Saturday.  We have discussed foregoing transfusion at this time in light of hospice transition but the patient would like to pursue 1 unit PRBC and observation over night to ensure resolution in her symptoms. On 12/19 patient developed a new fever and tested positive for COVID-19.  She now endorses significant weakness and feels unsafe going home.  Subjective: This morning patient reports overall achiness.  Reports that she feels worse than normal.  Reports her abdominal pain feels worse than normal as well. Fever 100.9 this morning   Objective: Vitals:   04/24/24 2209 04/25/24 0438 04/25/24 0627 04/25/24 0910  BP: 98/62 119/69 (!) 115/57 103/70  Pulse: 85 (!) 104 (!) 101 100  Resp: 16 20 16 18   Temp: 98.2 F (36.8 C) (!) 100.9 F (38.3 C) 100.2 F (37.9 C) 98.3 F (36.8 C)  TempSrc:  Oral Oral Oral  SpO2: 99% 100% 99% 100%  Weight:      Height:        Intake/Output Summary (Last 24 hours) at 04/25/2024 1413 Last data filed at 04/25/2024 0900 Gross per 24 hour  Intake 1706 ml  Output --  Net 1706 ml   Filed Weights   04/24/24 1316  Weight: 70.3 kg    Examination: General exam: Appears  uncomfortable Respiratory system: No work of breathing, symmetric chest wall expansion Cardiovascular system: S1 & S2 heard, RRR.  Gastrointestinal system: Abdomen is nondistended, diffusely tender to palpation Neuro: Alert and oriented. No focal neurological deficits. Psychiatry: Demonstrates appropriate judgement and insight. Mood & affect appropriate for situation.   Assessment & Plan:  Principal Problem:   Symptomatic anemia Active Problems:   Malignant neoplasm of both ovaries (HCC)   Essential hypertension   Anxiety   RA (rheumatoid arthritis) (HCC)   Multiple sclerosis   COVID-19 virus infection    COVID 19 - New fever today, generalized achiness - Patient is not hypoxic, continue with supportive care  Symptomatic anemia Metastatic ovarian cancer - Has fungating mass feeding through duodenum, recently evaluated by EGD in November which found that the cancer mass has invaded the small bowel and is necrotic.  This is a known source of GI bleed - Has received 1 unit PRBC with modest improvement and hemoglobin - Continue with twice daily PPI - Patient is no longer pursuing treatment for her malignancy, follows outpatient with Dr. Lonn - Palliative care has been consulted, patient is planning to transition to hospice starting on Monday - We did discuss in light of her current hospice transition that she could opt to proceed with symptomatic treatment only and no longer receive blood transfusions.  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  Patient  is on very high MME at home, have continued this while admitted  Hypertension - No meds at home  COPD - Continue outpatient inhalers  ADHD - As needed Adderall  at home  RA - Not on outpatient medication  Multiple sclerosis - On outpatient meds  Anxiety - Continue home dose Ativan   Body mass index is 22.89 kg/m. - Outpatient  follow up for lifestyle modification and risk factor management  Generalized weakness - Currently patient lives alone.  She does have some family support but nothing consistent or 24/7.  She is currently weaker than baseline given her worsening anemia, progressive cancer, and new COVID diagnosis.  She has requested to stay in house for an additional night to gain strength and have monitoring and will go home in the morning.  DVT prophylaxis: SCDss   Code Status: Full Code Disposition:  Obs, home in AM  Consultants:    Procedures:    Antimicrobials:  Anti-infectives (From admission, onward)    None       Data Reviewed: I have personally reviewed following labs and imaging studies CBC: Recent Labs  Lab 04/24/24 1323 04/25/24 0611  WBC 10.7* 10.7*  NEUTROABS 7.6  --   HGB 7.2* 7.5*  HCT 22.9* 23.3*  MCV 92.7 90.0  PLT 339 295   Basic Metabolic Panel: Recent Labs  Lab 04/24/24 1323 04/25/24 0611  NA 131* 135  K 3.8 3.7  CL 96* 101  CO2 22 25  GLUCOSE 140* 125*  BUN 11 8  CREATININE 0.74 0.75  CALCIUM 8.4* 8.0*   GFR: Estimated Creatinine Clearance: 72.3 mL/min (by C-G formula based on SCr of 0.75 mg/dL). Liver Function Tests: Recent Labs  Lab 04/24/24 1323  AST 18  ALT 10  ALKPHOS 56  BILITOT 0.3  PROT 6.5  ALBUMIN  3.1*   CBG: No results for input(s): GLUCAP in the last 168 hours.  Recent Results (from the past 240 hours)  Resp panel by RT-PCR (RSV, Flu A&B, Covid) Anterior Nasal Swab     Status: Abnormal   Collection Time: 04/25/24 11:39 AM   Specimen: Anterior Nasal Swab  Result Value Ref Range Status   SARS Coronavirus 2 by RT PCR POSITIVE (A) NEGATIVE Final    Comment: (NOTE) SARS-CoV-2 target nucleic acids are DETECTED.  The SARS-CoV-2 RNA is generally detectable in upper respiratory specimens during the acute phase of infection. Positive results are indicative of the presence of the identified virus, but do not rule out bacterial  infection or co-infection with other pathogens not detected by the test. Clinical correlation with patient history and other diagnostic information is necessary to determine patient infection status. The expected result is Negative.  Fact Sheet for Patients: bloggercourse.com  Fact Sheet for Healthcare Providers: seriousbroker.it  This test is not yet approved or cleared by the [place]  FDA and  has been authorized for detection and/or diagnosis of SARS-CoV-2 by FDA under an Emergency Use Authorization (EUA).  This EUA will remain in effect (meaning this test can be used) for the duration of  the COVID-19 declaration under Section 564(b)(1) of the A ct, 21 U.S.C. section 360bbb-3(b)(1), unless the authorization is terminated or revoked sooner.     Influenza A by PCR NEGATIVE NEGATIVE Final   Influenza B by PCR NEGATIVE NEGATIVE Final    Comment: (NOTE) The Xpert Xpress SARS-CoV-2/FLU/RSV plus assay is intended as an aid in the diagnosis of influenza from Nasopharyngeal swab specimens and should not be used as a sole basis for  treatment. Nasal washings and aspirates are unacceptable for Xpert Xpress SARS-CoV-2/FLU/RSV testing.  Fact Sheet for Patients: bloggercourse.com  Fact Sheet for Healthcare Providers: seriousbroker.it  This test is not yet approved or cleared by the [place]  FDA and has been authorized for detection and/or diagnosis of SARS-CoV-2 by FDA under an Emergency Use Authorization (EUA). This EUA will remain in effect (meaning this test can be used) for the duration of the COVID-19 declaration under Section 564(b)(1) of the Act, 21 U.S.C. section 360bbb-3(b)(1), unless the authorization is terminated or revoked.     Resp Syncytial Virus by PCR NEGATIVE NEGATIVE Final    Comment: (NOTE) Fact Sheet for  Patients: bloggercourse.com  Fact Sheet for Healthcare Providers: seriousbroker.it  This test is not yet approved or cleared by the [place]  FDA and has been authorized for detection and/or diagnosis of SARS-CoV-2 by FDA under an Emergency Use Authorization (EUA). This EUA will remain in effect (meaning this test can be used) for the duration of the COVID-19 declaration under Section 564(b)(1) of the Act, 21 U.S.C. section 360bbb-3(b)(1), unless the authorization is terminated or revoked.  Performed at [facility], [address], [place], [place] [address]      Radiology Studies: No results found.  Scheduled Meds:  feeding supplement  237 mL Oral BID BM   mirtazapine   7.5 mg Oral QHS   oxyCODONE   20 mg Oral 5 X Daily   oxyCODONE   15 mg Oral Q12H   pantoprazole   40 mg Oral BID   Continuous Infusions:   LOS: 0 days  MDM: Patient is high risk for one or more organ failure.  They necessitate ongoing hospitalization for continued IV therapies and subsequent lab monitoring. Total time spent interpreting labs and vitals, reviewing the medical record, coordinating care amongst consultants and care team members, directly assessing and discussing care with the patient and/or family: 55 min  Mikell Camp, DO [facility]  To contact the attending physician between 7A-7P please use Epic Chat. To contact the covering physician during after hours 7P-7A, please review Amion.  04/25/2024, 2:13 PM   *This document has been created with the assistance of dictation software. Please excuse typographical errors. *   "

## 2024-04-25 NOTE — TOC Initial Note (Signed)
 Transition of Care [facility]) - Initial/Assessment Note    Patient Details  Name: [name] MRN: [id number] Date of Birth: July 20, 1955  Transition of Care [facility]) CM/SW Contact:    Nathanael CHRISTELLA Ring, RN Phone Number: 04/25/2024, 1:48 PM  Clinical Narrative:                 CM met with patient at the bedside, introduced self and explained role in DC planning.  Patient lives alone in an apartment in [place].  She reports that she is having increasing trouble with doing certain things at home like taking the trash out to the curb.  She has a history of stage 4 ovarian cancer, she reports that they are just monitoring it now.  Her oncologist is in [place] but she is hoping to get her care moved to the cancer hospital here, told her to just call and ask to be referred to a provider at the cancer center center here.  She says that she drives, she has a walker and cane at home.  She reports that her sister should be able to pick her up at discharge.  She would like home health services at discharge, will send out referral for home health PT, OT, and SW  Expected Discharge Plan: Home w Home Health Services Barriers to Discharge: Continued Medical Work up   Patient Goals and CMS Choice   CMS Medicare.gov Compare Post Acute Care list provided to:: Patient Choice offered to / list presented to : Patient      Expected Discharge Plan and Services     Post Acute Care Choice: Home Health Living arrangements for the past 2 months: Apartment                 DME Arranged: N/A         HH Arranged: PT, OT, Social Work          Prior Living Arrangements/Services Living arrangements for the past 2 months: Apartment Lives with:: Self Patient language and need for interpreter reviewed:: Yes Do you feel safe going back to the place where you live?: Yes      Need for Family Participation in Patient Care: Yes (Comment) Care giver support system in place?: Yes (comment) Current home services: DME  (Walker, cane) Criminal Activity/Legal Involvement Pertinent to Current Situation/Hospitalization: No - Comment as needed  Activities of Daily Living   ADL Screening (condition at time of admission) Independently performs ADLs?: No Does the patient have a NEW difficulty with bathing/dressing/toileting/self-feeding that is expected to last >3 days?: Yes (Initiates electronic notice to provider for possible OT consult) Does the patient have a NEW difficulty with getting in/out of bed, walking, or climbing stairs that is expected to last >3 days?: Yes (Initiates electronic notice to provider for possible PT consult) Does the patient have a NEW difficulty with communication that is expected to last >3 days?: Yes (Initiates electronic notice to provider for possible SLP consult) Is the patient deaf or have difficulty hearing?: No Does the patient have difficulty seeing, even when wearing glasses/contacts?: No Does the patient have difficulty concentrating, remembering, or making decisions?: No  Permission Sought/Granted   Permission granted to share information with : Yes, Verbal Permission Granted     Permission granted to share info w AGENCY: Home health        Emotional Assessment Appearance:: Appears stated age Attitude/Demeanor/Rapport: Engaged Affect (typically observed): Accepting Orientation: : Oriented to Self, Oriented to Place, Oriented to  Time, Oriented  to Situation Alcohol / Substance Use: Not Applicable Psych Involvement: No (comment)  Admission diagnosis:  Weakness [R53.1] Symptomatic anemia [D64.9] Patient Active Problem List   Diagnosis Date Noted   Symptomatic anemia 04/24/2024   Anorexia 04/18/2024   Duodenal mass 03/31/2024   GI bleed 03/29/2024   Goals of care, counseling/discussion 12/13/2023   Oral infection 10/18/2023   Pancytopenia, acquired (HCC) 08/03/2023   Mucositis due to chemotherapy 08/03/2023   Anemia due to antineoplastic chemotherapy 07/26/2023    Nausea without vomiting 07/12/2023   Rash and nonspecific skin eruption 07/12/2023   Genetic testing 10/16/2022   Port-A-Cath in place 09/15/2022   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   Leukopenia due to antineoplastic chemotherapy 07/17/2022   Bilateral leg edema 05/30/2022   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 05/30/2022   Chronic use of opiate drug for therapeutic purpose 05/30/2022   Constipation 05/30/2022   Malignant neoplasm of both ovaries (HCC) 04/19/2022   Ovarian mass, left 04/19/2022   Multiple sclerosis 04/19/2022   Lumbar stenosis with neurogenic claudication 06/02/2020   Weakness of both lower extremities 06/02/2020   Lumbosacral radiculopathy 06/02/2020   Chest pain 10/18/2017   Anxiety    Hepatitis C    Hypertension    Osteoarthritis    RA (rheumatoid arthritis) (HCC)    Palpitations 02/02/2016   SYNCOPE AND COLLAPSE 10/23/2008   FIBROIDS, UTERUS 10/13/2008   Anxiety state 10/13/2008   DEPRESSION 10/13/2008   Essential hypertension 10/13/2008   VENTRICULAR TACHYCARDIA 10/13/2008   UNSPECIFIED PAROXYSMAL TACHYCARDIA 10/13/2008   Asthma 10/13/2008   GERD 10/13/2008   Endometriosis 10/13/2008   Osteoarthritis 10/13/2008   TACHYCARDIA 10/13/2008   PCP:  Arloa Elsie SAUNDERS, MD Pharmacy:   [id number] [address] [place], [place] - [address] [place] [address] Phone: [phone] Fax: [phone]  D[facility] LONG - [facility] [address] [place] [address] Phone: [phone] Fax: [phone]     Social Drivers of Health (SDOH) Social History: SDOH Screenings   Food Insecurity: No Food Insecurity (04/24/2024)  Housing: Unknown (04/24/2024)  Transportation Needs: No Transportation Needs (04/24/2024)  Utilities: Not At Risk (04/24/2024)  Social Connections: Socially Isolated (04/24/2024)  Tobacco Use: Medium Risk (04/24/2024)   SDOH  Interventions:     Readmission Risk Interventions     No data to display

## 2024-04-25 NOTE — Progress Notes (Signed)
" °   04/25/24 0438  Assess: MEWS Score  Temp (!) 100.9 F (38.3 C)  BP 119/69  MAP (mmHg) 85  Pulse Rate (!) 104  Resp 20  SpO2 100 %  O2 Device Room Air  Assess: MEWS Score  MEWS Temp 1  MEWS Systolic 0  MEWS Pulse 1  MEWS RR 0  MEWS LOC 0  MEWS Score 2  MEWS Score Color Yellow  Assess: if the MEWS score is Yellow or Red  Were vital signs accurate and taken at a resting state? Yes  Does the patient meet 2 or more of the SIRS criteria? No  MEWS guidelines implemented  Yes, yellow  Treat  MEWS Interventions Considered administering scheduled or prn medications/treatments as ordered  Take Vital Signs  Increase Vital Sign Frequency  Yellow: Q2hr x1, continue Q4hrs until patient remains green for 12hrs  Escalate  MEWS: Escalate Yellow: Discuss with charge nurse and consider notifying provider and/or RRT  Notify: Charge Nurse/RN  Name of Charge Nurse/RN Notified [name], RN  Provider Notification  Provider Name/Title Delayne Solian, MD  Date Provider Notified 04/25/24  Time Provider Notified [phone]  Method of Notification Page  Notification Reason Other (Comment) (Fever)  Provider response No new orders  Date of Provider Response 04/25/24  Time of Provider Response 0535  Assess: SIRS CRITERIA  SIRS Temperature  0  SIRS Respirations  0  SIRS Pulse 1  SIRS WBC 0  SIRS Score Sum  1    "

## 2024-04-26 DIAGNOSIS — D649 Anemia, unspecified: Secondary | ICD-10-CM | POA: Diagnosis not present

## 2024-04-26 MED ORDER — LACTULOSE 10 GM/15ML PO SOLN
20.0000 g | Freq: Two times a day (BID) | ORAL | Status: DC
  Administered 2024-04-26: 20 g via ORAL
  Filled 2024-04-26: qty 30

## 2024-04-26 NOTE — Plan of Care (Signed)
 Palliative-   Consult received- chart reviewed.  Patient is being discharged today and has been referred to hospice. Unable to complete consult prior to discharge.  Please call if any assistance from Palliative is needed.  Thank you for the consult.   Cassondra Stain, AGNP-C Palliative Medicine  No charge

## 2024-04-26 NOTE — Care Management Obs Status (Signed)
 MEDICARE OBSERVATION STATUS NOTIFICATION   Patient Details  Name: [name] MRN: [id number] Date of Birth: 1956/01/09   Medicare Observation Status Notification Given:  Yes will mail copy to patients home   [name] 04/26/2024, 12:14 PM

## 2024-04-26 NOTE — Discharge Summary (Signed)
 [address]    AZHAR YOGI FMW:[id number] DOB: 17-Oct-1955 DOA: 04/24/2024  PCP: Arloa Elsie SAUNDERS, MD  Admit date: 04/24/2024 Discharge date: 04/26/2024   Recommendations for Outpatient Follow-up:  Follow up with PCP in 1-2 weeks to continue chronic condition management  Hospital Course: [name] is a 68 year-old female with depression, endometriosis, rheumatoid arthritis, GERD, hypertension, multiple sclerosis, metastatic ovarian cancer unresponsive to treatment.  Patient's ovarian cancer has spread and she had recent endoscopy in November which revealed large fungating and ulcerating mass with stigmata of recent bleeding in the duodenal bulb.  She presents to the ED today for progressive weakness, melena, and acute anemia secondary to this source. Ultimately the patient is transitioning to hospice and is set up for outpatient hospice evaluation on Saturday.  We have discussed foregoing transfusion at this time in light of hospice transition but the patient would like to pursue 1 unit PRBC and observation over night to ensure resolution in her symptoms. On 12/19 patient developed a new fever and tested positive for COVID-19 and felt too unwell to go home. By reevaluation on 12/20 patient reported she was feeling much better.  Her abdominal pain has resolved to baseline, no further aches or fevers.  She endorses feeling ready to go home   COVID 19 - Patient is not hypoxic, continue with supportive care   Symptomatic anemia Metastatic ovarian cancer - Has fungating mass feeding through duodenum, recently evaluated by EGD in November which found that the cancer mass has invaded the small bowel and is necrotic.  This is a known source of GI bleed - Has received 1 unit PRBC with modest improvement and hemoglobin - Continue with twice daily PPI - Patient is no longer pursuing treatment for her malignancy, follows outpatient with Dr. Lonn - Patient is planning to  transition to hospice outpatient - We did discuss in light of her current hospice transition that she could opt to proceed with symptomatic treatment only and no longer receive blood transfusions.  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  Patient is on very high dose opioids at home.  This is necessary for her Malignancy associated pain and acceptable on hospice care.  Hypertension - No meds at home   COPD - Continue outpatient inhalers   ADHD - As needed Adderall  at home   RA - Not on outpatient medication   Multiple sclerosis - On outpatient meds   Anxiety - Continue home dose Ativan     Generalized weakness - Currently patient lives alone.  She does have some family support in the area but endorses feeling ready to go home today to her current living situation  Discharge Instructions  Discharge Instructions     Call MD for:  difficulty breathing, headache or visual disturbances   Complete by: As directed    Call MD for:  persistant dizziness or light-headedness   Complete by: As directed    Call MD for:  persistant nausea and vomiting   Complete by: As directed    Call MD for:  severe uncontrolled pain   Complete by: As directed    Call MD for:  temperature >100.4   Complete by: As directed    Diet general   Complete by: As directed    Discharge instructions   Complete by: As directed    Follow up with your primary care physician to discuss the medication changes during  this admission   Increase activity slowly   Complete by: As directed       Allergies as of 04/26/2024       Reactions   Cymbalta [duloxetine Hcl] Shortness Of Breath, Swelling, Other (See Comments)   Tongue and leg swelling   Cyclobenzaprine Hcl Other (See Comments)   Immobility   Celexa  [citalopram Hydrobromide] Swelling, Other (See Comments)   Tongue swelling   Fluticasone  Furoate-vilanterol  Swelling, Other (See Comments)   BREO ELLIPTA - Tongue swelling, but breathing not affected   Gabapentin Other (See Comments)   Feels awful when taking, drowsy    Opana  [oxymorphone Hcl] Itching, Other (See Comments)   Loss of hair, also   Tramadol Other (See Comments)   Dizziness        Medication List     STOP taking these medications    atenolol  25 MG tablet Commonly known as: TENORMIN    irbesartan  300 MG tablet Commonly known as: AVAPRO        TAKE these medications    albuterol  108 (90 Base) MCG/ACT inhaler Commonly known as: VENTOLIN  HFA Inhale 2 puffs into the lungs every 6 (six) hours as needed for shortness of breath.   amphetamine -dextroamphetamine  20 MG tablet Commonly known as: ADDERALL  Take 10-20 mg by mouth See admin instructions. Take 20 mg by mouth in the morning and 10 mg between 1-2 PM daily   ASHWAGANDHA PO Take 1 capsule by mouth daily.   baclofen  10 MG tablet Commonly known as: LIORESAL  Take 10 mg by mouth 3 (three) times daily as needed.   BLACK CURRANT SEED OIL PO Take 1 capsule by mouth daily.   dexamethasone  4 MG tablet Commonly known as: DECADRON  Take 1 tablet (4 mg total) by mouth daily.   hydroxychloroquine 200 MG tablet Commonly known as: PLAQUENIL Take 200 mg by mouth 2 (two) times daily.   lidocaine -prilocaine  cream Commonly known as: EMLA  Apply to affected area once   LORazepam  0.5 MG tablet Commonly known as: ATIVAN  Take 0.5 mg by mouth 3 (three) times daily as needed for anxiety.   mirtazapine  7.5 MG tablet Commonly known as: REMERON  Take 7.5 mg by mouth at bedtime.   multivitamin with minerals Tabs tablet Take 1 tablet by mouth daily.   Narcan  4 MG/0.1ML Liqd nasal spray kit Generic drug: naloxone  Place 1 spray into the nose as needed (opioid overdose).   omeprazole  20 MG capsule Commonly known as: PRILOSEC TAKE 1 CAPSULE BY MOUTH EVERY DAY   ondansetron  8 MG tablet Commonly known as: Zofran  Take 1 tablet  (8 mg total) by mouth every 8 (eight) hours as needed for nausea or vomiting.   OVER THE COUNTER MEDICATION Take 2 capsules by mouth daily. Sea Moss advanced supplement   Oxycodone  HCl 20 MG Tabs Take 20 mg by mouth 5 (five) times daily as needed (pain).   polyethylene glycol 17 g packet Commonly known as: MIRALAX  / GLYCOLAX  Take 17 g by mouth daily.   prochlorperazine  10 MG tablet Commonly known as: COMPAZINE  Take 1 tablet (10 mg total) by mouth every 6 (six) hours as needed for nausea or vomiting.   vitamin C 1000 MG tablet Take 1,000 mg by mouth daily.   Vitamin D (Ergocalciferol) 1.25 MG (50000 UNIT) Caps capsule Commonly known as: DRISDOL Take 50,000 Units by mouth every Sunday.   Wixela Inhub 250-50 MCG/ACT Aepb Generic drug: fluticasone -salmeterol Inhale 1 puff into the lungs daily.   Xtampza  ER 13.5 MG C12a Generic drug: oxyCODONE   ER Take 13.5 mg by mouth in the morning, at noon, and at bedtime.        Follow-up Information     Arloa Elsie SAUNDERS, MD Follow up.   Specialty: Family Medicine Why: hospital follow up Contact information: [address] [place] [address] [phone]                Allergies[1]  Consultations:    Procedures/Studies: US  Venous Img Upper Uni Left (DVT) Result Date: 04/01/2024 CLINICAL DATA:  LEFT upper extremity pain. History of tobacco use and ovarian malignancy. EXAM: LEFT UPPER EXTREMITY VENOUS DOPPLER ULTRASOUND TECHNIQUE: Gray-scale sonography with graded compression, as well as color Doppler and duplex ultrasound were performed to evaluate the upper extremity deep venous system from the level of the subclavian vein and including the jugular, axillary, basilic, radial, ulnar and upper cephalic vein. Spectral Doppler was utilized to evaluate flow at rest and with distal augmentation maneuvers. COMPARISON:  None available FINDINGS: Internal Jugular Vein: No evidence of thrombus. Normal  compressibility, respiratory phasicity and response to augmentation. Subclavian Vein: No evidence of thrombus. Normal compressibility, respiratory phasicity and response to augmentation. Axillary Vein: No evidence of thrombus. Normal compressibility, respiratory phasicity and response to augmentation. Cephalic Vein: Intramural filling defect with decreased flow and compressibility of the cephalic vein and adjacent varicose vein at the level of the mid upper arm is consistent with acute DVT. This also corresponds to the patient's palpable abnormality. Basilic Vein: No evidence of thrombus. Normal compressibility, respiratory phasicity and response to augmentation. Brachial Veins: No evidence of thrombus. Normal compressibility, respiratory phasicity and response to augmentation. Radial Veins: No evidence of thrombus. Normal compressibility, respiratory phasicity and response to augmentation. Ulnar Veins: No evidence of thrombus. Normal compressibility, respiratory phasicity and response to augmentation. Venous Reflux:  None visualized. Other Findings:  None visualized. IMPRESSION: 1. No DVT of the LEFT upper extremity. 2. Acute superficial venous thrombosis of the cephalic vein and adjacent varicose vein at the level of the mid upper arm. Electronically Signed   By: Aliene Lloyd M.D.   On: 04/01/2024 07:35   CT ANGIO GI BLEED Result Date: 03/29/2024 CLINICAL DATA:  Lightheadedness, dark stool for 1 month, epigastric pain for several days, history of ovarian cancer EXAM: CTA ABDOMEN AND PELVIS WITHOUT AND WITH CONTRAST TECHNIQUE: Multidetector CT imaging of the abdomen and pelvis was performed using the standard protocol during bolus administration of intravenous contrast. Multiplanar reconstructed images and MIPs were obtained and reviewed to evaluate the vascular anatomy. RADIATION DOSE REDUCTION: This exam was performed according to the departmental dose-optimization program which includes automated exposure  control, adjustment of the mA and/or kV according to patient size and/or use of iterative reconstruction technique. CONTRAST:  OMNIPAQUE  IOHEXOL  350 MG/ML SOLN COMPARISON:  02/22/2024 FINDINGS: VASCULAR Aorta: Normal caliber aorta without aneurysm, dissection, vasculitis or significant stenosis. Mild atherosclerosis. Celiac: Patent without evidence of aneurysm, dissection, vasculitis or significant stenosis. SMA: Patent without evidence of aneurysm, dissection, vasculitis or significant stenosis. Renals: Both renal arteries are patent without evidence of aneurysm, dissection, vasculitis, fibromuscular dysplasia or significant stenosis. IMA: Patent without evidence of aneurysm, dissection, vasculitis or significant stenosis. Inflow: Patent without evidence of aneurysm, dissection, vasculitis or significant stenosis. Evaluation is slightly limited due to streak artifact from lumbar orthopedic hardware. Proximal Outflow: Bilateral common femoral and visualized portions of the superficial and profunda femoral arteries are patent without evidence of aneurysm, dissection, vasculitis or significant stenosis. Veins: Continue mass effect upon the portal  vein confluence due to the large mass between the duodenum and pancreatic head. No other acute venous abnormalities. Review of the MIP images confirms the above findings. NON-VASCULAR Lower chest: Pericardiophrenic lymph node is again identified, measuring 2.4 x 2.2 cm, previously 2.5 x 2.0 cm. No acute airspace disease. Hepatobiliary: No focal liver abnormality is seen. No gallstones, gallbladder wall thickening, or biliary dilatation. Pancreas: Unremarkable. No pancreatic ductal dilatation or surrounding inflammatory changes. Spleen: Normal in size without focal abnormality. Adrenals/Urinary Tract: Kidneys enhance normally. No urinary tract calculi. 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 No left-sided obstruction.  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 No specific follow-up is required. Left adrenal is unremarkable. Bladder is normal. Stomach/Bowel: No bowel obstruction or ileus. Prior sigmoid colon resection and reanastomosis. Previous appendectomy. There is a large necrotic 7.8 x 8.7 x 8.1 cm mass interposed between the head of the pancreas and duodenal ball. This mass displaces and likely invades the wall of the distal stomach and proximal duodenum, and may be the source for chronic blood loss. However, on today's exam, there is no evidence of contrast accumulation within the bowel lumen to suggest active hemorrhage. Lymphatic: Previous index enlarged lymph node in the right external iliac chain now measures 12 mm in short axis reference image 62/13, previously 9 mm. Stable right external iliac/inguinal lymph node image 69/13 measuring 6 mm in short axis. No new adenopathy is identified. Reproductive: Stable postsurgical changes from hysterectomy. Other: No free fluid or free intraperitoneal gas. Enlarged para incisional hernia in the right supraumbilical region, now containing a short segment of small bowel. No evidence of incarceration or obstruction. Musculoskeletal: Stable postsurgical changes within the lower lumbar spine. There are no acute or destructive bony abnormalities. Reconstructed images demonstrate no additional findings. IMPRESSION: VASCULAR 1. No evidence of active gastrointestinal bleeding. 2.  Aortic Atherosclerosis (ICD10-I70.0). NON-VASCULAR 1. Enlarging necrotic soft tissue mass interposed between the head of the pancreas and proximal duodenum. There is likely invasion of the dorsal wall of the distal stomach and duodenum, and this could be a source for chronic blood loss given clinical history. There is no evidence of active hemorrhage on today's exam. 2. Progressive adenopathy within the pericardiophrenic region and right external iliac chain consistent with worsening  metastatic disease. 3. Para incisional hernia in the right supraumbilical region, containing a short segment of small bowel. No incarceration or obstruction. Electronically Signed   By: Ozell Daring M.D.   On: 03/29/2024 16:19   DG Chest 2 View Result Date: 03/29/2024 CLINICAL DATA:  Chest pain. EXAM: CHEST - 2 VIEW COMPARISON:  Chest CT dated 09/19/2022. FINDINGS: Right-sided Port-A-Cath with tip over central SVC. No focal consolidation, pleural effusion, pneumothorax. The cardiac silhouette is within normal limits. No acute osseous pathology. Degenerative changes of the shoulders. IMPRESSION: No active cardiopulmonary disease. Electronically Signed   By: Vanetta Chou M.D.   On: 03/29/2024 13:01      Discharge Exam: Vitals:   04/26/24 0436 04/26/24 0943  BP: 112/73 102/62  Pulse: 98 (!) 103  Resp: 18 17  Temp: 99.9 F (37.7 C) 97.8 F (36.6 C)  SpO2: 90% 99%   Vitals:   04/25/24 1637 04/25/24 2101 04/26/24 0436 04/26/24 0943  BP: 104/68 116/73 112/73 102/62  Pulse: 96 (!) 109 98 (!) 103  Resp: 18 18 18 17   Temp: 99.4 F (37.4 C) 100.3 F (37.9 C) 99.9 F (37.7 C) 97.8 F (36.6 C)  TempSrc:  Oral Oral Oral   SpO2: 98% 99% 90% 99%  Weight:      Height:        Constitutional:  Normal appearance. Non toxic-appearing.  HENT: Head Normocephalic and atraumatic.  Mucous membranes are moist.  Eyes:  Extraocular intact. Conjunctivae normal.  Cardiovascular: Rate and Rhythm: Normal rate and regular rhythm.  Pulmonary: Non labored, symmetric rise of chest wall.  Skin: warm and dry. not jaundiced.  Neurological: No focal deficit present. alert. Oriented.  Psychiatric: Mood and Affect congruent.    The results of significant diagnostics from this hospitalization (including imaging, microbiology, ancillary and laboratory) are listed below for reference.     Microbiology: Recent Results (from the past 240 hours)  Resp panel by RT-PCR (RSV, Flu A&B, Covid) Anterior Nasal Swab      Status: Abnormal   Collection Time: 04/25/24 11:39 AM   Specimen: Anterior Nasal Swab  Result Value Ref Range Status   SARS Coronavirus 2 by RT PCR POSITIVE (A) NEGATIVE Final    Comment: (NOTE) SARS-CoV-2 target nucleic acids are DETECTED.  The SARS-CoV-2 RNA is generally detectable in upper respiratory specimens during the acute phase of infection. Positive results are indicative of the presence of the identified virus, but do not rule out bacterial infection or co-infection with other pathogens not detected by the test. Clinical correlation with patient history and other diagnostic information is necessary to determine patient infection status. The expected result is Negative.  Fact Sheet for Patients: bloggercourse.com  Fact Sheet for Healthcare Providers: seriousbroker.it  This test is not yet approved or cleared by the [place]  FDA and  has been authorized for detection and/or diagnosis of SARS-CoV-2 by FDA under an Emergency Use Authorization (EUA).  This EUA will remain in effect (meaning this test can be used) for the duration of  the COVID-19 declaration under Section 564(b)(1) of the A ct, 21 U.S.C. section 360bbb-3(b)(1), unless the authorization is terminated or revoked sooner.     Influenza A by PCR NEGATIVE NEGATIVE Final   Influenza B by PCR NEGATIVE NEGATIVE Final    Comment: (NOTE) The Xpert Xpress SARS-CoV-2/FLU/RSV plus assay is intended as an aid in the diagnosis of influenza from Nasopharyngeal swab specimens and should not be used as a sole basis for treatment. Nasal washings and aspirates are unacceptable for Xpert Xpress SARS-CoV-2/FLU/RSV testing.  Fact Sheet for Patients: bloggercourse.com  Fact Sheet for Healthcare Providers: seriousbroker.it  This test is not yet approved or cleared by the [place]  FDA and has been authorized for  detection and/or diagnosis of SARS-CoV-2 by FDA under an Emergency Use Authorization (EUA). This EUA will remain in effect (meaning this test can be used) for the duration of the COVID-19 declaration under Section 564(b)(1) of the Act, 21 U.S.C. section 360bbb-3(b)(1), unless the authorization is terminated or revoked.     Resp Syncytial Virus by PCR NEGATIVE NEGATIVE Final    Comment: (NOTE) Fact Sheet for Patients: bloggercourse.com  Fact Sheet for Healthcare Providers: seriousbroker.it  This test is not yet approved or cleared by the [place]  FDA and has been authorized for detection and/or diagnosis of SARS-CoV-2 by FDA under an Emergency Use Authorization (EUA). This EUA will remain in effect (meaning this test can be used) for the duration of the COVID-19 declaration under Section 564(b)(1) of the Act, 21 U.S.C. section 360bbb-3(b)(1), unless the authorization is terminated or revoked.  Performed at [facility], [address], [place], [place] [address]   Respiratory (~20 pathogens)  panel by PCR     Status: None   Collection Time: 04/25/24 11:39 AM   Specimen: Nasopharyngeal Swab; Respiratory  Result Value Ref Range Status   Adenovirus NOT DETECTED NOT DETECTED Final   Coronavirus 229E NOT DETECTED NOT DETECTED Final    Comment: (NOTE) The Coronavirus on the Respiratory Panel, DOES NOT test for the novel  Coronavirus (2019 nCoV)    Coronavirus HKU1 NOT DETECTED NOT DETECTED Final   Coronavirus NL63 NOT DETECTED NOT DETECTED Final   Coronavirus OC43 NOT DETECTED NOT DETECTED Final   Metapneumovirus NOT DETECTED NOT DETECTED Final   Rhinovirus / Enterovirus NOT DETECTED NOT DETECTED Final   Influenza A NOT DETECTED NOT DETECTED Final   Influenza B NOT DETECTED NOT DETECTED Final   Parainfluenza Virus 1 NOT DETECTED NOT DETECTED Final   Parainfluenza Virus 2 NOT DETECTED NOT DETECTED Final    Parainfluenza Virus 3 NOT DETECTED NOT DETECTED Final   Parainfluenza Virus 4 NOT DETECTED NOT DETECTED Final   Respiratory Syncytial Virus NOT DETECTED NOT DETECTED Final   Bordetella pertussis NOT DETECTED NOT DETECTED Final   Bordetella Parapertussis NOT DETECTED NOT DETECTED Final   Chlamydophila pneumoniae NOT DETECTED NOT DETECTED Final   Mycoplasma pneumoniae NOT DETECTED NOT DETECTED Final    Comment: Performed at [facility], [address] [place], [place] [address]     Labs: BNP (last 3 results) No results for input(s): BNP in the last 8760 hours. Basic Metabolic Panel: Recent Labs  Lab 04/24/24 1323 04/25/24 0611  NA 131* 135  K 3.8 3.7  CL 96* 101  CO2 22 25  GLUCOSE 140* 125*  BUN 11 8  CREATININE 0.74 0.75  CALCIUM 8.4* 8.0*   Liver Function Tests: Recent Labs  Lab 04/24/24 1323  AST 18  ALT 10  ALKPHOS 56  BILITOT 0.3  PROT 6.5  ALBUMIN  3.1*   Recent Labs  Lab 04/24/24 1323  LIPASE 32   No results for input(s): AMMONIA in the last 168 hours. CBC: Recent Labs  Lab 04/24/24 1323 04/25/24 0611  WBC 10.7* 10.7*  NEUTROABS 7.6  --   HGB 7.2* 7.5*  HCT 22.9* 23.3*  MCV 92.7 90.0  PLT 339 295   Cardiac Enzymes: No results for input(s): CKTOTAL, CKMB, CKMBINDEX, TROPONINI in the last 168 hours. BNP: Invalid input(s): POCBNP CBG: No results for input(s): GLUCAP in the last 168 hours. D-Dimer No results for input(s): DDIMER in the last 72 hours. Hgb A1c No results for input(s): HGBA1C in the last 72 hours. Lipid Profile No results for input(s): CHOL, HDL, LDLCALC, TRIG, CHOLHDL, LDLDIRECT in the last 72 hours. Thyroid  function studies No results for input(s): TSH, T4TOTAL, T3FREE, THYROIDAB in the last 72 hours.  Invalid input(s): FREET3 Anemia work up No results for input(s): VITAMINB12, FOLATE, FERRITIN, TIBC, IRON, RETICCTPCT in the last 72 hours. Urinalysis    Component  Value Date/Time   COLORURINE STRAW (A) 04/24/2024 1508   APPEARANCEUR CLEAR (A) 04/24/2024 1508   LABSPEC 1.004 (L) 04/24/2024 1508   PHURINE 6.0 04/24/2024 1508   GLUCOSEU NEGATIVE 04/24/2024 1508   HGBUR MODERATE (A) 04/24/2024 1508   BILIRUBINUR NEGATIVE 04/24/2024 1508   KETONESUR NEGATIVE 04/24/2024 1508   PROTEINUR NEGATIVE 04/24/2024 1508   NITRITE NEGATIVE 04/24/2024 1508   LEUKOCYTESUR NEGATIVE 04/24/2024 1508   Sepsis Labs Recent Labs  Lab 04/24/24 1323 04/25/24 0611  WBC 10.7* 10.7*   Microbiology Recent Results (from the past 240 hours)  Resp panel  by RT-PCR (RSV, Flu A&B, Covid) Anterior Nasal Swab     Status: Abnormal   Collection Time: 04/25/24 11:39 AM   Specimen: Anterior Nasal Swab  Result Value Ref Range Status   SARS Coronavirus 2 by RT PCR POSITIVE (A) NEGATIVE Final    Comment: (NOTE) SARS-CoV-2 target nucleic acids are DETECTED.  The SARS-CoV-2 RNA is generally detectable in upper respiratory specimens during the acute phase of infection. Positive results are indicative of the presence of the identified virus, but do not rule out bacterial infection or co-infection with other pathogens not detected by the test. Clinical correlation with patient history and other diagnostic information is necessary to determine patient infection status. The expected result is Negative.  Fact Sheet for Patients: bloggercourse.com  Fact Sheet for Healthcare Providers: seriousbroker.it  This test is not yet approved or cleared by the [place]  FDA and  has been authorized for detection and/or diagnosis of SARS-CoV-2 by FDA under an Emergency Use Authorization (EUA).  This EUA will remain in effect (meaning this test can be used) for the duration of  the COVID-19 declaration under Section 564(b)(1) of the A ct, 21 U.S.C. section 360bbb-3(b)(1), unless the authorization is terminated or revoked sooner.      Influenza A by PCR NEGATIVE NEGATIVE Final   Influenza B by PCR NEGATIVE NEGATIVE Final    Comment: (NOTE) The Xpert Xpress SARS-CoV-2/FLU/RSV plus assay is intended as an aid in the diagnosis of influenza from Nasopharyngeal swab specimens and should not be used as a sole basis for treatment. Nasal washings and aspirates are unacceptable for Xpert Xpress SARS-CoV-2/FLU/RSV testing.  Fact Sheet for Patients: bloggercourse.com  Fact Sheet for Healthcare Providers: seriousbroker.it  This test is not yet approved or cleared by the [place]  FDA and has been authorized for detection and/or diagnosis of SARS-CoV-2 by FDA under an Emergency Use Authorization (EUA). This EUA will remain in effect (meaning this test can be used) for the duration of the COVID-19 declaration under Section 564(b)(1) of the Act, 21 U.S.C. section 360bbb-3(b)(1), unless the authorization is terminated or revoked.     Resp Syncytial Virus by PCR NEGATIVE NEGATIVE Final    Comment: (NOTE) Fact Sheet for Patients: bloggercourse.com  Fact Sheet for Healthcare Providers: seriousbroker.it  This test is not yet approved or cleared by the [place]  FDA and has been authorized for detection and/or diagnosis of SARS-CoV-2 by FDA under an Emergency Use Authorization (EUA). This EUA will remain in effect (meaning this test can be used) for the duration of the COVID-19 declaration under Section 564(b)(1) of the Act, 21 U.S.C. section 360bbb-3(b)(1), unless the authorization is terminated or revoked.  Performed at [facility], [address] [place], [place] [address]   Respiratory (~20 pathogens) panel by PCR     Status: None   Collection Time: 04/25/24 11:39 AM   Specimen: Nasopharyngeal Swab; Respiratory  Result Value Ref Range Status   Adenovirus NOT DETECTED NOT DETECTED Final   Coronavirus  229E NOT DETECTED NOT DETECTED Final    Comment: (NOTE) The Coronavirus on the Respiratory Panel, DOES NOT test for the novel  Coronavirus (2019 nCoV)    Coronavirus HKU1 NOT DETECTED NOT DETECTED Final   Coronavirus NL63 NOT DETECTED NOT DETECTED Final   Coronavirus OC43 NOT DETECTED NOT DETECTED Final   Metapneumovirus NOT DETECTED NOT DETECTED Final   Rhinovirus / Enterovirus NOT DETECTED NOT DETECTED Final   Influenza A NOT DETECTED NOT DETECTED Final   Influenza B NOT  DETECTED NOT DETECTED Final   Parainfluenza Virus 1 NOT DETECTED NOT DETECTED Final   Parainfluenza Virus 2 NOT DETECTED NOT DETECTED Final   Parainfluenza Virus 3 NOT DETECTED NOT DETECTED Final   Parainfluenza Virus 4 NOT DETECTED NOT DETECTED Final   Respiratory Syncytial Virus NOT DETECTED NOT DETECTED Final   Bordetella pertussis NOT DETECTED NOT DETECTED Final   Bordetella Parapertussis NOT DETECTED NOT DETECTED Final   Chlamydophila pneumoniae NOT DETECTED NOT DETECTED Final   Mycoplasma pneumoniae NOT DETECTED NOT DETECTED Final    Comment: Performed at [facility], [address], [place], [place] [address]     Time coordinating discharge: 32 min   SIGNED: Lorane Poland, DO [facility] 04/26/2024, 11:31 AM Pager   If 7PM-7AM, please contact night-coverage     [1]  Allergies Allergen Reactions   Cymbalta [Duloxetine Hcl] Shortness Of Breath, Swelling and Other (See Comments)    Tongue and leg swelling   Cyclobenzaprine Hcl Other (See Comments)    Immobility   Celexa  [Citalopram Hydrobromide] Swelling and Other (See Comments)    Tongue swelling   Fluticasone  Furoate-Vilanterol Swelling and Other (See Comments)    BREO ELLIPTA - Tongue swelling, but breathing not affected   Gabapentin Other (See Comments)    Feels awful when taking, drowsy    Opana  [Oxymorphone Hcl] Itching and Other (See Comments)    Loss of hair, also   Tramadol Other (See Comments)    Dizziness    "

## 2024-04-28 ENCOUNTER — Encounter: Payer: Self-pay | Admitting: Hematology and Oncology

## 2024-04-28 NOTE — Telephone Encounter (Signed)
 Enter in error

## 2024-04-29 ENCOUNTER — Telehealth: Payer: Self-pay

## 2024-04-29 NOTE — Telephone Encounter (Signed)
 The patient reported that she is no longer living in [place] and asked how she would obtain her bloodwork. Chart review indicates that the patient has a referral to hospice.

## 2024-05-05 ENCOUNTER — Telehealth: Payer: Self-pay | Admitting: Oncology

## 2024-05-05 NOTE — Telephone Encounter (Signed)
 Called [facility] and scheduled an appointment with Dr. Lonn tomorrow at 1:40 with labs before at 1:00.

## 2024-05-06 ENCOUNTER — Inpatient Hospital Stay: Admitting: Hematology and Oncology

## 2024-05-06 ENCOUNTER — Inpatient Hospital Stay

## 2024-05-09 ENCOUNTER — Other Ambulatory Visit: Payer: Self-pay

## 2024-05-09 ENCOUNTER — Emergency Department
Admission: EM | Admit: 2024-05-09 | Discharge: 2024-05-09 | Disposition: A | Discharge status: Home / Self Care | Source: Home / Self Care | Attending: Emergency Medicine | Admitting: Emergency Medicine

## 2024-05-09 ENCOUNTER — Telehealth: Payer: Self-pay

## 2024-05-09 ENCOUNTER — Other Ambulatory Visit: Payer: Self-pay | Admitting: Hematology and Oncology

## 2024-05-09 ENCOUNTER — Emergency Department

## 2024-05-09 DIAGNOSIS — R109 Unspecified abdominal pain: Secondary | ICD-10-CM | POA: Insufficient documentation

## 2024-05-09 DIAGNOSIS — K219 Gastro-esophageal reflux disease without esophagitis: Secondary | ICD-10-CM | POA: Diagnosis not present

## 2024-05-09 DIAGNOSIS — G8929 Other chronic pain: Secondary | ICD-10-CM | POA: Insufficient documentation

## 2024-05-09 DIAGNOSIS — R531 Weakness: Secondary | ICD-10-CM

## 2024-05-09 DIAGNOSIS — C563 Malignant neoplasm of bilateral ovaries: Secondary | ICD-10-CM

## 2024-05-09 DIAGNOSIS — Z8543 Personal history of malignant neoplasm of ovary: Secondary | ICD-10-CM | POA: Insufficient documentation

## 2024-05-09 DIAGNOSIS — D649 Anemia, unspecified: Secondary | ICD-10-CM | POA: Diagnosis present

## 2024-05-09 LAB — TROPONIN T, HIGH SENSITIVITY: Troponin T High Sensitivity: 15 ng/L (ref 0–19)

## 2024-05-09 LAB — CBC
HCT: 22.7 % — ABNORMAL LOW (ref 36.0–46.0)
Hemoglobin: 6.9 g/dL — ABNORMAL LOW (ref 12.0–15.0)
MCH: 28 pg (ref 26.0–34.0)
MCHC: 30.4 g/dL (ref 30.0–36.0)
MCV: 92.3 fL (ref 80.0–100.0)
Platelets: 498 K/uL — ABNORMAL HIGH (ref 150–400)
RBC: 2.46 MIL/uL — ABNORMAL LOW (ref 3.87–5.11)
RDW: 16.9 % — ABNORMAL HIGH (ref 11.5–15.5)
WBC: 7 K/uL (ref 4.0–10.5)
nRBC: 0 % (ref 0.0–0.2)

## 2024-05-09 LAB — BASIC METABOLIC PANEL WITH GFR
Anion gap: 14 (ref 5–15)
BUN: 12 mg/dL (ref 8–23)
CO2: 24 mmol/L (ref 22–32)
Calcium: 9.6 mg/dL (ref 8.9–10.3)
Chloride: 98 mmol/L (ref 98–111)
Creatinine, Ser: 0.81 mg/dL (ref 0.44–1.00)
GFR, Estimated: >60 mL/min
Glucose, Bld: 128 mg/dL — ABNORMAL HIGH (ref 70–99)
Potassium: 4.1 mmol/L (ref 3.5–5.1)
Sodium: 136 mmol/L (ref 135–145)

## 2024-05-09 LAB — PREPARE RBC (CROSSMATCH)

## 2024-05-09 MED ORDER — ONDANSETRON HCL 4 MG/2ML IJ SOLN
4.0000 mg | Freq: Once | INTRAMUSCULAR | Status: AC
  Administered 2024-05-09: 4 mg via INTRAVENOUS
  Filled 2024-05-09: qty 2

## 2024-05-09 MED ORDER — SODIUM CHLORIDE 0.9 % IV SOLN: 10.0000 mL/h | Freq: Once | INTRAVENOUS | Status: DC

## 2024-05-09 MED ORDER — MORPHINE SULFATE (PF) 4 MG/ML IV SOLN
4.0000 mg | Freq: Once | INTRAVENOUS | Status: AC
  Administered 2024-05-09: 4 mg via INTRAVENOUS
  Filled 2024-05-09: qty 1

## 2024-05-09 MED ORDER — LACTATED RINGERS IV BOLUS
1000.0000 mL | Freq: Once | INTRAVENOUS | Status: AC
  Administered 2024-05-09: 1000 mL via INTRAVENOUS

## 2024-05-09 NOTE — ED Triage Notes (Signed)
 C/o weakness and chest pressure that started yesterday. PMH: ovarian Ca and GI bleed 2 months ago. Pt often needs blood transfusions. Not currently receiving chemo at this time. GCS 15

## 2024-05-09 NOTE — ED Notes (Signed)
 Pt given DC instructions. Pt verbalized understanding of follow up care. Pt taken from ED in wheelchair by this RN.

## 2024-05-09 NOTE — Telephone Encounter (Signed)
 Returned her call and told her Dr. Lonn put in a order to transfer her care closer to her home as she requested. She verbalized understanding. She is not feeling well today and thinks she needs a blood transfusion. She is going to the ER at [facility] today for evaluation.  FYI

## 2024-05-09 NOTE — ED Provider Notes (Signed)
 "  [facility] Provider Note    Event Date/Time   First MD Initiated Contact with Patient 05/09/24 1305     (approximate)  History   Chief Complaint: Weakness and Chest Pain  HPI  [name] is a 69 y.o. female with a past medical history of anxiety, ovarian cancer, MS, gastric reflux, presents to the emergency department for pain and weakness.  Patient has a history of metastatic ovarian cancer and that has been unresponsive to treatment.  Patient has chronic pain due to this oncologic process, states recently ran out of pain medication is waiting for her prescription to be filled.  Also states she has been experiencing weakness over the last few days but seems somewhat worse today.  Patient has a history of requiring transfusions in the past.  Patient denies any fever.  No shortness of breath.  Physical Exam   Triage Vital Signs: ED Triage Vitals  Encounter Vitals Group     BP 05/09/24 1223 (!) 115/103     Girls Systolic BP Percentile --      Girls Diastolic BP Percentile --      Boys Systolic BP Percentile --      Boys Diastolic BP Percentile --      Pulse Rate 05/09/24 1223 90     Resp 05/09/24 1223 18     Temp 05/09/24 1223 98.8 F (37.1 C)     Temp Source 05/09/24 1223 Oral     SpO2 05/09/24 1223 100 %     Weight 05/09/24 1224 155 lb (70.3 kg)     Height 05/09/24 1224 5' 9 (1.753 m)     Head Circumference --      Peak Flow --      Pain Score 05/09/24 1224 6     Pain Loc --      Pain Education --      Exclude from Growth Chart --     Most recent vital signs: Vitals:   05/09/24 1223  BP: (!) 115/103  Pulse: 90  Resp: 18  Temp: 98.8 F (37.1 C)  SpO2: 100%    General: Awake, no distress.  CV:  Good peripheral perfusion.  Regular rate and rhythm  Resp:  Normal effort.  Equal breath sounds bilaterally.  Abd:  No distention.  Soft states mid abdominal pain which is chronic.  ED Results / Procedures / Treatments   RADIOLOGY  I  have reviewed interpret the chest x-ray images.  No obvious consolidation seen to my evaluation, right sided port is present. Radiology has read the x-ray is negative   MEDICATIONS ORDERED IN ED: Medications  lactated ringers  bolus 1,000 mL (has no administration in time range)  0.9 %  sodium chloride  infusion (has no administration in time range)  morphine  (PF) 4 MG/ML injection 4 mg (has no administration in time range)  ondansetron  (ZOFRAN ) injection 4 mg (has no administration in time range)     IMPRESSION / MDM / ASSESSMENT AND PLAN / ED COURSE  I reviewed the triage vital signs and the nursing notes.  Patient's presentation is most consistent with acute presentation with potential threat to life or bodily function.  Patient presents to the emergency department for acute on chronic pain as well as generalized weakness.  Patient has a history of anemia and intermittently requires transfusions.  Patient states she has been feeling more weak over the last few days and she thought her blood counts could be low.  Patient's lab  work today does show a hemoglobin of 6.9.  Discussed pros and cons of transfusion patient would like to proceed with 1 unit of packed red blood cells which I believe is reasonable.  Patient has recently changed her care for her ovarian cancer from [facility] to our local oncology practice.  Patient also states she has been experiencing increased pain in her abdomen which she states is chronic due to her ovarian cancer/mass.  Normally takes oxycodone  at home but ran out today.  Will dose pain medication.  Remainder the patient's lab work shows a reassuring chemistry negative troponin.  We will reassess after blood products and pain management.  I believe the patient could potentially be discharged home with outpatient follow-up at the oncology center as long as she is feeling well.  CRITICAL CARE Performed by: Franky Moores   Total critical care time: 30  minutes  Critical care time was exclusive of separately billable procedures and treating other patients.  Critical care was necessary to treat or prevent imminent or life-threatening deterioration.  Critical care was time spent personally by me on the following activities: development of treatment plan with patient and/or surrogate as well as nursing, discussions with consultants, evaluation of patient's response to treatment, examination of patient, obtaining history from patient or surrogate, ordering and performing treatments and interventions, ordering and review of laboratory studies, ordering and review of radiographic studies, pulse oximetry and re-evaluation of patient's condition.   FINAL CLINICAL IMPRESSION(S) / ED DIAGNOSES   Weakness Symptomatic anemia Chronic pain   Note:  This document was prepared using Dragon voice recognition software and may include unintentional dictation errors.   Moores Franky, MD 05/09/24 1417  "

## 2024-05-09 NOTE — ED Provider Notes (Signed)
" °  Physical Exam  BP 107/67   Pulse 85   Temp 98.6 F (37 C) (Oral)   Resp 19   Ht 5' 9 (1.753 m)   Wt 70.3 kg   SpO2 99%   BMI 22.89 kg/m   Physical Exam  Procedures  Procedures  ED Course / MDM    Medical Decision Making Amount and/or Complexity of Data Reviewed Labs: ordered. Radiology: ordered.  Risk Prescription drug management.   This patient's care was signed out to me pending completion of her blood transfusion.  After completion the patient reports that she is feeling significantly improved.  She states that she would like to be discharged to go home at this time.  Discussed follow-up with her primary care physician.  She will return to the emergency department for new or worsening symptoms.       Rexford Reche HERO, MD 05/09/24 1742  "

## 2024-05-10 LAB — TYPE AND SCREEN
ABO/RH(D): O POS
Antibody Screen: NEGATIVE
Unit division: 0

## 2024-05-10 LAB — BPAM RBC
Blood Product Expiration Date: 202601272359
ISSUE DATE / TIME: 202601021542
Unit Type and Rh: 5100

## 2024-05-13 ENCOUNTER — Telehealth: Payer: Self-pay

## 2024-05-13 DIAGNOSIS — C563 Malignant neoplasm of bilateral ovaries: Secondary | ICD-10-CM

## 2024-05-13 DIAGNOSIS — K921 Melena: Secondary | ICD-10-CM

## 2024-05-13 NOTE — Telephone Encounter (Signed)
 Called and spoke with [name]. She will come in early for labs on 1/9 at 0915. Chair held for 1230 for possible blood transfusion. She was agreeable to this time.

## 2024-05-15 ENCOUNTER — Inpatient Hospital Stay: Attending: Gynecologic Oncology

## 2024-05-15 DIAGNOSIS — C563 Malignant neoplasm of bilateral ovaries: Secondary | ICD-10-CM | POA: Diagnosis not present

## 2024-05-15 DIAGNOSIS — D500 Iron deficiency anemia secondary to blood loss (chronic): Secondary | ICD-10-CM | POA: Insufficient documentation

## 2024-05-15 DIAGNOSIS — C786 Secondary malignant neoplasm of retroperitoneum and peritoneum: Secondary | ICD-10-CM | POA: Insufficient documentation

## 2024-05-15 DIAGNOSIS — K922 Gastrointestinal hemorrhage, unspecified: Secondary | ICD-10-CM | POA: Insufficient documentation

## 2024-05-15 DIAGNOSIS — Z452 Encounter for adjustment and management of vascular access device: Secondary | ICD-10-CM | POA: Diagnosis not present

## 2024-05-15 DIAGNOSIS — K921 Melena: Secondary | ICD-10-CM

## 2024-05-15 LAB — IRON AND TIBC
Iron: 24 ug/dL — ABNORMAL LOW (ref 28–170)
Saturation Ratios: 7 % — ABNORMAL LOW (ref 10.4–31.8)
TIBC: 344 ug/dL (ref 250–450)
UIBC: 321 ug/dL

## 2024-05-15 LAB — CMP (CANCER CENTER ONLY)
ALT: 15 U/L (ref 0–44)
AST: 23 U/L (ref 15–41)
Albumin: 3.6 g/dL (ref 3.5–5.0)
Alkaline Phosphatase: 77 U/L (ref 38–126)
Anion gap: 11 (ref 5–15)
BUN: 17 mg/dL (ref 8–23)
CO2: 24 mmol/L (ref 22–32)
Calcium: 8.9 mg/dL (ref 8.9–10.3)
Chloride: 101 mmol/L (ref 98–111)
Creatinine: 0.74 mg/dL (ref 0.44–1.00)
GFR, Estimated: >60 mL/min
Glucose, Bld: 99 mg/dL (ref 70–99)
Potassium: 3.7 mmol/L (ref 3.5–5.1)
Sodium: 136 mmol/L (ref 135–145)
Total Bilirubin: <0.2 mg/dL (ref 0.0–1.2)
Total Protein: 6.9 g/dL (ref 6.5–8.1)

## 2024-05-15 LAB — CBC WITH DIFFERENTIAL (CANCER CENTER ONLY)
Abs Immature Granulocytes: 0.07 K/uL (ref 0.00–0.07)
Basophils Absolute: 0 K/uL (ref 0.0–0.1)
Basophils Relative: 0 %
Eosinophils Absolute: 0.1 K/uL (ref 0.0–0.5)
Eosinophils Relative: 1 %
HCT: 26.6 % — ABNORMAL LOW (ref 36.0–46.0)
Hemoglobin: 8.3 g/dL — ABNORMAL LOW (ref 12.0–15.0)
Immature Granulocytes: 1 %
Lymphocytes Relative: 34 %
Lymphs Abs: 2.5 K/uL (ref 0.7–4.0)
MCH: 28.4 pg (ref 26.0–34.0)
MCHC: 31.2 g/dL (ref 30.0–36.0)
MCV: 91.1 fL (ref 80.0–100.0)
Monocytes Absolute: 0.6 K/uL (ref 0.1–1.0)
Monocytes Relative: 7 %
Neutro Abs: 4.2 K/uL (ref 1.7–7.7)
Neutrophils Relative %: 57 %
Platelet Count: 362 K/uL (ref 150–400)
RBC: 2.92 MIL/uL — ABNORMAL LOW (ref 3.87–5.11)
RDW: 17 % — ABNORMAL HIGH (ref 11.5–15.5)
WBC Count: 7.5 K/uL (ref 4.0–10.5)
nRBC: 0 % (ref 0.0–0.2)

## 2024-05-15 LAB — SAMPLE TO BLOOD BANK

## 2024-05-15 LAB — FOLATE: Folate: 8.3 ng/mL

## 2024-05-15 LAB — FERRITIN: Ferritin: 79 ng/mL (ref 11–307)

## 2024-05-16 ENCOUNTER — Inpatient Hospital Stay

## 2024-05-16 ENCOUNTER — Inpatient Hospital Stay: Admitting: Oncology

## 2024-05-16 ENCOUNTER — Inpatient Hospital Stay: Admitting: Nurse Practitioner

## 2024-05-16 ENCOUNTER — Inpatient Hospital Stay: Admitting: Hospice and Palliative Medicine

## 2024-05-16 ENCOUNTER — Encounter: Payer: Self-pay | Admitting: Oncology

## 2024-05-16 VITALS — BP 141/83 | HR 87 | Temp 97.6°F | Resp 18 | Wt 154.3 lb

## 2024-05-16 DIAGNOSIS — K921 Melena: Secondary | ICD-10-CM | POA: Diagnosis not present

## 2024-05-16 DIAGNOSIS — D500 Iron deficiency anemia secondary to blood loss (chronic): Secondary | ICD-10-CM

## 2024-05-16 DIAGNOSIS — C563 Malignant neoplasm of bilateral ovaries: Secondary | ICD-10-CM

## 2024-05-16 DIAGNOSIS — T451X5A Adverse effect of antineoplastic and immunosuppressive drugs, initial encounter: Secondary | ICD-10-CM | POA: Diagnosis not present

## 2024-05-16 DIAGNOSIS — M069 Rheumatoid arthritis, unspecified: Secondary | ICD-10-CM

## 2024-05-16 DIAGNOSIS — G620 Drug-induced polyneuropathy: Secondary | ICD-10-CM | POA: Diagnosis not present

## 2024-05-16 NOTE — Progress Notes (Signed)
 "    Palliative Medicine [facility] at [facility] Telephone:([phone]) [phone] Fax:(336) [phone]   Name: [name] Date: 05/16/2024 MRN: [id number]  DOB: 11-16-1955  Patient Care Team: Arloa Elsie SAUNDERS, MD as PCP - General (Family Medicine) [name] (Physician Assistant) Maurie Rayfield BIRCH, RN as Oncology Nurse Navigator    REASON FOR CONSULTATION: [name] is a 69 y.o. female with multiple medical problems including RA, MS, and metastatic ovarian cancer unresponsive to previous treatment.  Patient was hospitalized in November and December 2025 with GI bleed.  She had EGD with findings of a bleeding mass, which is invading the small bowel.  Patient has required frequent transfusions. She was referred to palliative care to discuss goals.   SOCIAL HISTORY:     reports that she has been smoking cigarettes. She started smoking about 30 years ago. She has a 2.7 pack-year smoking history. She has never used smokeless tobacco. She reports that she does not currently use alcohol. She reports current drug use. Drug: Oxycodone .  Patient is married.  Lives at home alone.  Has a daughter in [place].  Has a sister who is involved.  She recently retired from [employer] where she worked in [employer].  ADVANCE DIRECTIVES:  On file  CODE STATUS: Full code  PAST MEDICAL HISTORY: Past Medical History:  Diagnosis Date   Anxiety    Asthma    Back problem    disc disease, chronic low back pain, right leg pain, s/p discectomy 99   Cancer (HCC)    ovarian cancer   Depression    Diastolic dysfunction    with elevated LVEDP at cath   Endometriosis    Fibroids    hx of    GERD (gastroesophageal reflux disease)    Heart murmur    Hepatitis C    treated 8-9 years ago   Hidradenitis suppurativa    History of kidney stones    Hypertension    MS (multiple sclerosis)    Neuromuscular disorder (HCC)    Mulitple sclerosis    Osteoarthritis    Pre-diabetes    Prediabetes    RA (rheumatoid arthritis) (HCC)    Rotator cuff tear    right shoulder    Syncope     PAST SURGICAL HISTORY:  Past Surgical History:  Procedure Laterality Date   ANGIOPLASTY     BACK SURGERY     lumbar surgery x 2 done in [place]  and [place]   COLON RESECTION SIGMOID  08/16/2022   Procedure: COLON RESECTION SIGMOID;  Surgeon: Viktoria Comer SAUNDERS, MD;  Location: [facility];  Service: Gynecology;;   COLONOSCOPY  07/2010   tubular adenoma   COLONOSCOPY  08/2015   CYSTOSCOPY N/A 08/16/2022   Procedure: CYSTOSCOPY;  Surgeon: Viktoria Comer SAUNDERS, MD;  Location: [facility];  Service: Gynecology;  Laterality: N/A;   DISKECTOMY     ESOPHAGOGASTRODUODENOSCOPY N/A 03/30/2024   Procedure: EGD (ESOPHAGOGASTRODUODENOSCOPY);  Surgeon: Maryruth Ole DASEN, MD;  Location: [facility] ENDOSCOPY;  Service: Endoscopy;  Laterality: N/A;   HYSTERECTOMY ABDOMINAL WITH SALPINGO-OOPHORECTOMY  08/16/2022   Procedure: HYSTERECTOMY ABDOMINAL WITH SALPINGO-OOPHORECTOMY;  Surgeon: Viktoria Comer SAUNDERS, MD;  Location: [facility];  Service: Gynecology;;   IR IMAGING GUIDED PORT INSERTION  05/18/2022   IR IMAGING GUIDED PORT INSERTION  07/03/2023   IR REMOVAL TUN ACCESS W/ PORT W/O FL MOD SED  12/20/2022   IR US  GUIDE BX ASP/DRAIN  05/18/2022   LAPAROSCOPY  N/A 08/16/2022   Procedure: LAPAROSCOPY DIAGNOSTIC;  Surgeon: Viktoria Comer SAUNDERS, MD;  Location: [facility];  Service: Gynecology;  Laterality: N/A;   laparoscopy for endometriosis     left shoulder scope     RADIOLOGY WITH ANESTHESIA N/A 05/18/2022   Procedure: IR WITH ANESTHESIA PORT AND BIOPSY;  Surgeon: Luverne Aran, MD;  Location: [facility];  Service: Radiology;  Laterality: N/A;   ROTATOR CUFF REPAIR Left    Spinal Fusion  12/2016   L4-S1    HEMATOLOGY/ONCOLOGY HISTORY:  Oncology History Overview Note  High grade serous, p53 mutated Neg genetics, HRD not detected, MSI stable, low TMB 2, progressed on carboplatin  and  gemcitabine , paclitaxel    Malignant neoplasm of both ovaries (HCC)  04/11/2022 Imaging   CT of the abdomen and pelvis on 04/11/2022 reveals a left adnexal masslike area measuring 5.9 x 3.6 cm.  Margins of this mass are irregular.  There is small fluid in the cul-de-sac as well as fullness of soft tissue about the right adnexa.  Discrete uterine structure is not identified.  Left upper quadrant nodules beneath the left hemidiaphragm (1 anterior to the spleen and the other to the stomach).  Omental/peritoneal nodularity and multiple perihepatic nodules are noted    04/18/2022 Tumor Marker   Patient's tumor was tested for the following markers: CA-125. Results of the tumor marker test revealed 123.   04/19/2022 Initial Diagnosis   Carcinomatosis (HCC)   04/28/2022 Imaging   1. Bulky bilateral ovarian masses and nodules, left-greater-than-right, which are essentially confluent with adjacent pelvic soft tissue nodularity and not significantly changed compared to prior examination dated 04/11/2022. 2. Extensive pelvic peritoneal thickening and nodularity. Multiple small peritoneal nodules throughout the abdomen and pelvis. Findings are consistent with peritoneal metastatic disease and likewise not significantly changed. 3. Small volume of loculated appearing fluid in the low pelvis. 4. 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 The mid to distal right ureter is obstructed by right ovarian mass and or soft tissue nodularity. 5. Calculus within the most distal left ureterovesicular junction or just within the bladder lumen measuring 0.7 cm, unchanged. No associated left-sided hydronephrosis. 6. Soft tissue attenuation nodule of the body of the right adrenal gland. Notably, this was also soft tissue attenuation on prior noncontrast examination (i.e. not definitively macroscopic fat containing) although  unchanged compared to prior examinations dating back to 2018 and almost certainly a benign adenoma. Attention on follow-up. 7. No evidence of metastatic disease in the chest. 8. Mild diffuse bilateral bronchial wall thickening. Background of very fine centrilobular nodularity, most concentrated in the lung apices. Findings are most consistent with smoking-related respiratory bronchiolitis.     05/18/2022 Procedure   Ultrasound-guided core biopsy performed of a 10 mm soft tissue peritoneal mass in the right upper quadrant just superficial to the right lobe of the liver. The procedure was performed under general anesthesia immediately following Port-A-Cath placement.   05/18/2022 Procedure   Placement of single lumen port a cath via right internal jugular vein. The catheter tip lies at the cavo-atrial junction. A power injectable port a cath was placed and is ready for immediate use.     05/18/2022 Pathology Results   A. 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  The findings support the above interpretation.  This  case was reviewed with Dr. Rebbecca who agrees with the above diagnosis.  A p16 stain is pending and will be reported in an addendum.    05/23/2022 Cancer Staging   Staging form: Ovary, Fallopian Tube, and Primary Peritoneal Carcinoma, AJCC 8th Edition - Clinical stage from 05/23/2022: FIGO Stage IIIC (cT3c, cN0, cM0) - Signed by Lonn Hicks, MD on 05/23/2022 Stage prefix: Initial diagnosis   05/29/2022 Imaging   1. Complex bilateral adnexal masses, as described above, consistent with the patient's known malignancy. 2. Subsequent marked severity mass effect on the distal bilateral common iliac veins, increased in severity when compared to the prior exam. 3. Additional findings consistent with peritoneal metastasis within  the pelvis. 4. Findings consistent with pelvic congestion syndrome. 5. Postoperative changes within the mid and lower lumbar spine. 6. Aortic atherosclerosis. 7. No evidence of venous thrombus within the abdomen, pelvis or proximal bilateral lower extremities.   Aortic Atherosclerosis (ICD10-I70.0).   06/02/2022 - 11/03/2022 Chemotherapy   Patient is on Treatment Plan : OVARIAN Carboplatin  (AUC 6) + Paclitaxel  (175) q21d X 6 Cycles     06/26/2022 Tumor Marker   Patient's tumor was tested for the following markers: CA-125. Results of the tumor marker test revealed 134.   08/04/2022 Imaging   CT ABDOMEN PELVIS W CONTRAST  Result Date: 08/04/2022 CLINICAL DATA:  Ovarian cancer, chemotherapy in progress, for restaging EXAM: CT ABDOMEN AND PELVIS WITH CONTRAST TECHNIQUE: Multidetector CT imaging of the abdomen and pelvis was performed using the standard protocol following bolus administration of intravenous contrast. RADIATION DOSE REDUCTION: This exam was performed according to the departmental dose-optimization program which includes automated exposure control, adjustment of the mA and/or kV according to patient size and/or use of iterative reconstruction technique. CONTRAST:  OMNIPAQUE  IOHEXOL  300 MG/ML  SOLN COMPARISON:  CT venogram abdomen/pelvis dated 05/29/2022. CT abdomen/pelvis dated 04/26/2022. FINDINGS: Lower chest: Lung bases are clear. Hepatobiliary: Liver is within normal limits. Gallbladder is unremarkable. No intrahepatic or extrahepatic ductal dilatation. Pancreas: Within normal limits. Spleen: Within normal limits. Adrenals/Urinary Tract: 2.4 cm right adrenal nodule (series 2/image 17), previously 2.0 cm in 2018, likely reflecting a benign adrenal adenoma. Left adrenal gland is within normal limits. Left kidney is within normal limits. 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 Associated  extrinsic compression at the level of the right adnexal mass (described below). Mildly thick-walled bladder, although underdistended. Stable 6 mm left UVJ calcification (series 2/image 68). Stomach/Bowel: Stomach is within normal limits. No evidence of bowel obstruction. Normal appendix (series 2/image 47). Left adnexal mass (described below) is favored to abut and directly invade the left lateral wall of the sigmoid colon (series 2/image 57). Vascular/Lymphatic: No evidence of abdominal aortic aneurysm. Atherosclerotic calcifications of the abdominal aorta and branch vessels. No suspicious abdominopelvic lymphadenopathy. Reproductive: Heterogeneous uterus with associated surface nodularity (series 2/image 65), likely reflecting peritoneal disease. This appearance is improved from the prior. 3.9 x 3.3 cm left adnexal mass (series 2/image 60), previously 5.3 x 3.9 cm, with extension along the sigmoid mesocolon (series 2/image 56) and suspected involvement of the sigmoid colon (series 2/image 57). 5.6 x 2.7 cm mixed cystic/solid right adnexal mass (series 2/image 89), previously 6.9 x 4.3 cm when measured in a similar fashion. Other: No abdominopelvic ascites. Scattered minimal peritoneal nodularity beneath the anterior abdominal wall, including a 5 mm nodule beneath the left anterior abdominal wall (series 2/image 27) which previously measured 12 mm. Additional mild nodularity  on series 2/images 32, 33, and 50. Musculoskeletal: Status post PLIF at L4-S1. Status post lateral fixation at L4-5. Status post anterior fixation at L5-S1. Mild degenerative changes of the lower thoracic spine. IMPRESSION: Improving bilateral adnexa masses in this patient with known ovarian cancer, as above. Suspected involvement of the sigmoid colon. No evidence of bowel obstruction. Improving peritoneal nodularity, as above. Additional ancillary findings as above. Electronically Signed   By: Pinkie Pebbles M.D.   On: 08/04/2022 01:27       08/16/2022 Pathology Results   CASE: [id number] PATIENT: BARBEE MACE Surgical Pathology Report  Clinical History: Advanced gyn malignancy (crm)  FINAL MICROSCOPIC DIAGNOSIS:  A. OMENTUM, RESECTION: - Positive for carcinoma  B. LIVER ADHESION, EXCISION: - Benign fibroadipose tissue - Negative for carcinoma  C. SMALL BOWEL MESENTERIC IMPLANT, EXCISION: - Positive for carcinoma with extensive necrosis  D. UTERUS, CERVIX, BILATERAL FALLOPIAN TUBES AND OVARIES, AND APPENDIX, RESECTION: - Uterine serosa: Tumor deposits present - Cervix: Benign, nabothian cyst - Endometrium: Benign inactive endometrium, benign endometrial polyp - Myometrium: Leiomyoma - Right fallopian tube: Metastatic tumor deposits present - Right ovary: High-grade serous carcinoma - Left ovary: High-grade serous carcinoma - Benign appendix - See oncology table  E. COLON, RECTOSIGMOID, RESECTION: - Benign colonic mucosa - Margins free of carcinoma - Adherent fallopian tube with metastatic tumor deposits - 2 benign lymph nodes  F. RECTAL TUMOR IMPLANT, EXCISION: - Metastatic high-grade serous carcinoma   G. COLON, RECTOSIGMOID ANASTOMOTIC RINGS: - Benign anastomotic donuts  ONCOLOGY TABLE:   OVARY or FALLOPIAN TUBE or PRIMARY PERITONEUM: Resection  Procedure: Total abdominal hysterectomy, bilateral salpingo-oophorectomy, appendectomy, rectosigmoid resection, total omentectomy, small bowel nodule resection Specimen Integrity: Intact Tumor Site: Bilateral Tumor Size: Right ovary: 3.9 cm, left ovary: Approximately 1.2 cm Histologic Type: High-grade serous carcinoma Histologic Grade: High-grade Ovarian Surface Involvement: Present Fallopian Tube Surface Involvement: Present Implants: Present, small bowel mesenteric and rectovaginal septum Lymphatic and/or Vascular Invasion: Not identified Other Tissue/ Organ Involvement: Fallopian tube, bilateral Largest Extrapelvic Peritoneal Focus: 1.4  cm, small bowel mesentery Peritoneal/Ascitic Fluid Involvement: Not applicable Chemotherapy Response Score (CRS): Not applicable, no known presurgical therapy Regional Lymph Nodes: Not applicable (no lymph nodes submitted or found) Distant Metastasis:      Distant Site(s) Involved: Not applicable Pathologic Stage Classification (pTNM, AJCC 8th Edition): pT3b, pN[not assigned] Ancillary Studies: Can be performed upon request Representative Tumor Block: D8 Comment(s): Tumor is staged as pT3 given the presence of extrapelvic peritoneal deposits on small bowel mesentery less than 2cm (1.4 cm) ([id number])    09/18/2022 Tumor Marker   Patient's tumor was tested for the following markers: CA-125. Results of the tumor marker test revealed 36.8.   10/15/2022 Genetic Testing   Negative [facility] Multi-Cancer +RNA Panel.  VUS in POLD1 at c.1322C>T (p.Thr441Met). Report date is 10/15/2022.   The Multi-Cancer + RNA Panel offered by [facility] includes sequencing and/or deletion/duplication analysis of the following 70 genes:  AIP*, ALK, APC*, ATM*, AXIN2*, BAP1*, BARD1*, BLM*, BMPR1A*, BRCA1*, BRCA2*, BRIP1*, CDC73*, CDH1*, CDK4, CDKN1B*, CDKN2A, CHEK2*, CTNNA1*, DICER1*, EPCAM (del/dup only), EGFR, FH*, FLCN*, GREM1 (promoter dup only), HOXB13, KIT, LZTR1, MAX*, MBD4, MEN1*, MET, MITF, MLH1*, MSH2*, MSH3*, MSH6*, MUTYH*, NF1*, NF2*, NTHL1*, PALB2*, PDGFRA, PMS2*, POLD1*, POLE*, POT1*, PRKAR1A*, PTCH1*, PTEN*, RAD51C*, RAD51D*, RB1*, RET, SDHA* (sequencing only), SDHAF2*, SDHB*, SDHC*, SDHD*, SMAD4*, SMARCA4*, SMARCB1*, SMARCE1*, STK11*, SUFU*, TMEM127*, TP53*, TSC1*, TSC2*, VHL*. RNA analysis is performed for * genes.   11/06/2022 Tumor Marker   Patient's tumor was tested for the following  markers: CA-125. Results of the tumor marker test revealed 16.8.   12/11/2022 Tumor Marker   Patient's tumor was tested for the following markers: CA-125. Results of the tumor marker test revealed 12.5.   12/14/2022 Imaging    CT ABDOMEN PELVIS W CONTRAST  Result Date: 12/13/2022 CLINICAL DATA:  Ovarian cancer restaging * Tracking Code: BO * EXAM: CT ABDOMEN AND PELVIS WITH CONTRAST TECHNIQUE: Multidetector CT imaging of the abdomen and pelvis was performed using the standard protocol following bolus administration of intravenous contrast. RADIATION DOSE REDUCTION: This exam was performed according to the departmental dose-optimization program which includes automated exposure control, adjustment of the mA and/or kV according to patient size and/or use of iterative reconstruction technique. CONTRAST:  OMNIPAQUE  IOHEXOL  300 MG/ML  SOLN COMPARISON:  08/02/2022 FINDINGS: Lower chest: Unremarkable Hepatobiliary: Unremarkable Pancreas: Unremarkable Spleen: Unremarkable Adrenals/Urinary Tract: 2.6 by 2.1 cm right adrenal mass, internal density 55 Hounsfield units on portal venous phase images, relative washout of 13% which is indeterminate. Back in 2016 I measure this lesion at 2.2 by 1.6 cm, on the more recent comparison of 07/18/2022 this lesion measured 2.6 by 2.1 cm similar to today. Accordingly this lesion is stable from March and only very minimally increased in size from 2016, most likely benign. This can likely be surveilled in the context of the patient's ongoing oncology CT examinations. 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 The ureters transition to relatively normal caliber in the vicinity of the iliac vessel cross over, a specific cause for the transition is not identified. There is also a duplicated left renal collecting system without hydroureter on the left Stable 7 mm calcification or stone in the vicinity of the left ureterovesical junction. Stomach/Bowel: Prominent stool throughout the colon favors constipation. Anastomotic staple line in the sigmoid colon. No dilated small bowel. Interval  appendectomy. Previous nodularity along bowel including the sigmoid colon no longer identified. Vascular/Lymphatic: Atherosclerosis is present, including aortoiliac atherosclerotic disease. No pathologic adenopathy. Reproductive: Interval hysterectomy with salpingo-oophorectomy. Other: Interval omentectomy. No current compelling findings of peritoneal tumor deposits. No ascites. Musculoskeletal: Prior fusion at L4-L5-S1. IMPRESSION: 1. Interval hysterectomy, omentectomy, and appendectomy. No compelling findings of recurrent or residual peritoneal tumor deposits. 2. 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 A specific cause for the transition in caliber is not identified. 3. Stable 7 mm calcification or stone in the vicinity of the left ureterovesical junction. Duplicated left renal collecting system. 4. Prominent stool throughout the colon favors constipation. 5. Chronically stable but otherwise nonspecific right adrenal mass, likely benign. 6. Aortic atherosclerosis. Aortic Atherosclerosis (ICD10-I70.0). Electronically Signed   By: Ryan Salvage M.D.   On: 12/13/2022 15:16      12/20/2022 Procedure   Successful removal of a RIGHT chest implanted Port-A-Cath.    03/19/2023 Tumor Marker   Patient's tumor was tested for the following markers: CA-125. Results of the tumor marker test revealed 19.7.   06/14/2023 Imaging   CT ABDOMEN PELVIS W CONTRAST Result Date: 06/21/2023 CLINICAL DATA:  Restaging ovarian cancer.  * Tracking Code: BO * EXAM: CT ABDOMEN AND PELVIS WITH CONTRAST TECHNIQUE: Multidetector CT imaging of the abdomen and pelvis was performed using the standard protocol following bolus administration of intravenous contrast. RADIATION DOSE REDUCTION: This exam was performed according to the  departmental dose-optimization program which  includes automated exposure control, adjustment of the mA and/or kV according to patient size and/or use of iterative reconstruction technique. CONTRAST:  OMNIPAQUE  IOHEXOL  300 MG/ML  SOLN COMPARISON:  CT abdomen and pelvis dated 12/08/2022 FINDINGS: Lower chest: No focal consolidation or pulmonary nodule in the lung bases. No pleural effusion or pneumothorax demonstrated. Partially imaged heart size is normal. Hepatobiliary: No focal hepatic lesions. No intra or extrahepatic biliary ductal dilation. Normal gallbladder. Pancreas: No focal lesions or main ductal dilation. Spleen: Normal in size without focal abnormality. Adrenals/Urinary Tract: Unchanged 2.0 cm right adrenal nodule measuring 43 HU (2:16). No left adrenal nodule. 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 Duplex left kidney. No hydronephrosis. Unchanged 7 mm calcification adjacent to the left ureterovesical junction. No focal bladder wall thickening. Stomach/Bowel: Normal appearance of the stomach. Postsurgical changes from rectosigmoid resection with patent anastomosis. No evidence of bowel wall thickening, distention, or inflammatory changes. Appendectomy. Vascular/Lymphatic: Aortic atherosclerosis. New and increased size of multi station lymphadenopathy and peritoneal nodules, for example: -12 mm cardiophrenic (2:2), previously partially imaged at 3 mm -new 12 mm midline epigastric anterior peritoneal (2:23) -new 3.1 x 2.9 cm peripancreatic (2:25) -14 mm right pelvic sidewall (2:56), previously 4 mm -11 mm right external iliac (2:60), previously 7 mm Reproductive: Status post hysterectomy and bilateral salpingo-oophorectomy. No adnexal masses. Increased lobulated soft tissue density adjacent to the right vaginal cuff measuring 2.9 x 1.8 cm (2:64), previously 2.4 x 1.7 cm Other: Status post total omentectomy. No free fluid, fluid collection, or free air. Musculoskeletal: No acute or abnormal lytic or blastic osseous lesions.  Postsurgical changes of L4-S1 spinal fixation. Hardware appears intact. Postsurgical changes of the anterior abdominal wall. IMPRESSION: 1. New and increased size of multi station lymphadenopathy and peritoneal nodules, consistent with metastatic disease. 2. Increased lobulated soft tissue density adjacent to the right vaginal cuff measuring 2.9 x 1.8 cm, previously 2.4 x 1.7 cm, suspicious for recurrent disease. 3. Unchanged 2.0 cm right adrenal nodule, favored benign. Recommend continued attention on follow-up. 4. 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 Duplex left kidney. No hydronephrosis. Unchanged 7 mm calcification adjacent to the left ureterovesical junction. 5.  Aortic Atherosclerosis (ICD10-I70.0). Electronically Signed   By: Limin  Xu M.D.   On: 06/21/2023 09:35      06/18/2023 Tumor Marker   Patient's tumor was tested for the following markers: CA-125. Results of the tumor marker test revealed 32.9.   07/03/2023 Procedure   Successful placement of a power injectable Port-A-Cath via the right internal jugular vein. The catheter is ready for immediate use.   07/04/2023 - 08/31/2023 Chemotherapy   Patient is on Treatment Plan : OVARIAN RECURRENT 3RD LINE Bevacizumab  D1 + Carboplatin  D1 + Gemcitabine  D1,8 (4/800) q21d     07/13/2023 Tumor Marker   Patient's tumor was tested for the following markers: CA-125. Results of the tumor marker test revealed 33.2.   08/17/2023 Tumor Marker   Patient's tumor was tested for the following markers: CA-125. Results of the tumor marker test revealed 16.1.   08/30/2023 Imaging   1. Status post hysterectomy and bilateral salpingo-oophorectomy. 2. Multistation adenopathy and peritoneal nodularity is again noted, compatible with metastatic disease. Mild increased size of peripancreatic node. Upper abdominal peritoneal nodule is slightly decreased in size in the interval. Additional index lymph nodes are stable in the interval. 3.  Increased soft tissue density adjacent to the vaginal cuff, concerning for locally recurrent disease. 4. Duplex right kidney with mild  hydronephrosis and hydroureter, unchanged. Stone within the left UVJ is again noted measuring 0.8 x 0.4 cm, unchanged from previous exam. 5. Stable right adrenal nodule.  None 6. Supraumbilical ventral abdominal wall hernia containing a nonobstructed loop of small bowel. 7.  Aortic Atherosclerosis (ICD10-I70.0).       09/27/2023 - 11/15/2023 Chemotherapy   Patient is on Treatment Plan : OVARIAN Paclitaxel  (80) I8,1,84,77 q28d     09/28/2023 Tumor Marker   Patient's tumor was tested for the following markers: CA-125. Results of the tumor marker test revealed 29.8.   11/05/2023 Tumor Marker   Patient's tumor was tested for the following markers: CA-125. Results of the tumor marker test revealed 40.   11/16/2023 Tumor Marker   Patient's tumor was tested for the following markers: CA-125. Results of the tumor marker test revealed 32.3.   11/22/2023 Imaging   CT ABDOMEN PELVIS W CONTRAST Result Date: 11/28/2023 CLINICAL DATA:  Ovarian cancer.  * Tracking Code: BO * EXAM: CT ABDOMEN AND PELVIS WITH CONTRAST TECHNIQUE: Multidetector CT imaging of the abdomen and pelvis was performed using the standard protocol following bolus administration of intravenous contrast. RADIATION DOSE REDUCTION: This exam was performed according to the departmental dose-optimization program which includes automated exposure control, adjustment of the mA and/or kV according to patient size and/or use of iterative reconstruction technique. CONTRAST:  OMNIPAQUE  IOHEXOL  300 MG/ML  SOLN COMPARISON:  08/30/2023. FINDINGS: Lower chest: Lung bases are clear. Prepericardiac lymph node measures 1.6 cm, enlarged from 1.1 cm on 08/30/2023. Heart is at the upper limits of normal in size. No pericardial or pleural effusion. Distal esophagus is grossly unremarkable. Hepatobiliary: Liver and  gallbladder are unremarkable. No biliary ductal dilatation. Pancreas: Negative. Spleen: Negative. Adrenals/Urinary Tract: 2.4 cm right adrenal nodule, chronically stable and characterized previously 2 as an adenoma. No specific follow-up necessary. Minimal nodular thickening of the left adrenal gland, unchanged. Minimal scarring in the right kidney. Kidneys are otherwise unremarkable. Difficulty did right ureter appears to join in the midportion. Right ureter is decompressed. Left ureter may be minimally dilated secondary to an 8 mm stone at the left ureteral orifice, unchanged from 08/30/2023. Bladder is otherwise grossly unremarkable. Stomach/Bowel: Stomach, small bowel and colon are unremarkable. Appendix is not readily visualized. Vascular/Lymphatic: Atherosclerotic calcification of the aorta. Heterogeneous nodal mass or peritoneal implant between the distal stomach and pancreas measures 3.7 x 5.3 cm (2/29), enlarged from 3.0 x 3.7 cm. Right external iliac lymph node measures 7 mm (2/58), previously 12 mm. A second right external iliac lymph node measures 7 mm (2/64), stable. Reproductive: Hysterectomy. Similar asymmetric nodular soft tissue along the right vaginal cuff (2/66-67). No adnexal mass. Other: Previously seen ventral midline epigastric peritoneal nodule is no longer visualized. No free fluid. Small incisional hernia contains fat along the ventral supraumbilical midline. Musculoskeletal: Osteopenia. Degenerative changes in the spine. L5-S1 fusion. IMPRESSION: 1. Enlarging prepericardiac nodal metastasis. Enlarging nodal mass or peritoneal implant between the distal stomach and pancreas. 2. Resolved ventral midline epigastric peritoneal metastasis. 3. Right external iliac lymph nodes are stable to smaller in size. 4. Asymmetric nodular thickening of the right vaginal cuff, stable. 5. Trace left ureteral dilatation with a chronic 8 mm stone at the left ureteral orifice. 6. Stable right adrenal nodule,  previously characterized as an adenoma. 7.  Aortic atherosclerosis (ICD10-I70.0). Electronically Signed   By: Newell Eke M.D.   On: 11/28/2023 13:56      02/22/2024 Imaging   CT ABDOMEN PELVIS W CONTRAST Result Date:  02/24/2024 CLINICAL DATA:  Ovarian cancer, monitor, off chemotherapy. * Tracking Code: BO * EXAM: CT ABDOMEN AND PELVIS WITH CONTRAST TECHNIQUE: Multidetector CT imaging of the abdomen and pelvis was performed using the standard protocol following bolus administration of intravenous contrast. RADIATION DOSE REDUCTION: This exam was performed according to the departmental dose-optimization program which includes automated exposure control, adjustment of the mA and/or kV according to patient size and/or use of iterative reconstruction technique. CONTRAST:  OMNIPAQUE  IOHEXOL  300 MG/ML  SOLN COMPARISON:  Multiple priors including CT November 22, 2023 FINDINGS: Lower chest: No acute abnormality. Centrally necrotic pericardiophrenic lymph node measures 18 mm in short axis on image 3/2 previously 16 mm Hepatobiliary: No suspicious hepatic lesion. Gallbladder is unremarkable. No biliary ductal dilation. Pancreas: No pancreatic ductal dilation or evidence of acute inflammation. Spleen: No splenomegaly. Adrenals/Urinary Tract: 2.1 cm right adrenal nodule on image 20/2 stable over multiple prior examinations and previously characterized as a benign adenoma. Left adrenal gland appears normal. 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 Similar prominence of the left ureter with 8 mm stone at the UVJ. Stomach/Bowel: Stomach is minimally distended limiting evaluation. No pathologic dilation of small or large bowel. No evidence of acute bowel inflammation. Vascular/Lymphatic: Normal caliber abdominal aorta. Smooth IVC contours. Narrowing of the portal vein and portal confluence by a heterogeneous enlarged lymph node/mass which measures 7.4 x 6.5 cm on image  27/2 previously 4.7 x 4.1 cm when remeasured for consistency, this sits between the pancreatic head and distal stomach. Previously indexed right external iliac lymph node measures 9 mm in short axis on image 58/2 previously 7 mm -additional right external iliac lymph node previously indexed measures 6 mm in short axis on image 66/2 previously 7 mm. Reproductive: Prior hysterectomy with similar asymmetric nodular soft tissue on the right side of the vaginal cuff on image 67/2. no suspicious adnexal mass. Other: No significant abdominopelvic free fluid. Postsurgical change in the anterior abdominal wall with fat containing incisional hernia. Musculoskeletal: No aggressive lytic or blastic lesion of bone. Spinal fusion hardware. Diffuse demineralization of bone. Thoracolumbar spondylosis. IMPRESSION: 1. Interval increase in size of the heterogeneous enlarged lymph node/mass between the pancreatic head and distal stomach. 2. Slight interval increase in size of the centrally necrotic pericardiophrenic lymph node. 3. Previously indexed right external iliac lymph nodes are similar to slightly increased in size. 4. Prior hysterectomy with similar asymmetric nodular soft tissue on the right side of the vaginal cuff. 5. Similar prominence of the left ureter with 8 mm stone at the UVJ. 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 Electronically Signed   By: Reyes Holder M.D.   On: 02/24/2024 13:03   MR BRAIN W WO CONTRAST Result Date: 02/18/2024 CLINICAL DATA:  Multiple sclerosis. EXAM: MRI HEAD WITHOUT AND WITH CONTRAST TECHNIQUE: Multiplanar, multiecho pulse sequences of the brain and surrounding structures were obtained without and with intravenous contrast. CONTRAST:  8 mL Vueway  COMPARISON:  Head MRI 10/26/2022 FINDINGS: Brain: There is no evidence of an acute infarct, intracranial hemorrhage, mass, midline shift, or extra-axial fluid collection. Cerebral volume is within normal  limits for age. The ventricles are normal in size. T2 hyperintensities in the juxtacortical, periventricular, and deep cerebral white matter bilaterally and in the pons are unchanged and moderately advanced for age. None show enhancement or restricted diffusion. Mild T2 heterogeneity in the thalami and at most minimal in the cerebellum are unchanged. Vascular: Major intracranial vascular flow  voids are preserved. Skull and upper cervical spine: Unremarkable bone marrow signal. Sinuses/Orbits: Unremarkable orbits. Paranasal sinuses and mastoid air cells are clear. Other: None. IMPRESSION: Unchanged moderately advanced white matter disease, nonspecific but may reflect a combination chronic demyelinating disease and chronic small vessel ischemia. No evidence of active demyelination. Electronically Signed   By: Dasie Hamburg M.D.   On: 02/18/2024 08:06      03/29/2024 Imaging   US  Venous Img Upper Uni Left (DVT) Result Date: 04/01/2024 CLINICAL DATA:  LEFT upper extremity pain. History of tobacco use and ovarian malignancy. EXAM: LEFT UPPER EXTREMITY VENOUS DOPPLER ULTRASOUND TECHNIQUE: Gray-scale sonography with graded compression, as well as color Doppler and duplex ultrasound were performed to evaluate the upper extremity deep venous system from the level of the subclavian vein and including the jugular, axillary, basilic, radial, ulnar and upper cephalic vein. Spectral Doppler was utilized to evaluate flow at rest and with distal augmentation maneuvers. COMPARISON:  None available FINDINGS: Internal Jugular Vein: No evidence of thrombus. Normal compressibility, respiratory phasicity and response to augmentation. Subclavian Vein: No evidence of thrombus. Normal compressibility, respiratory phasicity and response to augmentation. Axillary Vein: No evidence of thrombus. Normal compressibility, respiratory phasicity and response to augmentation. Cephalic Vein: Intramural filling defect with decreased flow and  compressibility of the cephalic vein and adjacent varicose vein at the level of the mid upper arm is consistent with acute DVT. This also corresponds to the patient's palpable abnormality. Basilic Vein: No evidence of thrombus. Normal compressibility, respiratory phasicity and response to augmentation. Brachial Veins: No evidence of thrombus. Normal compressibility, respiratory phasicity and response to augmentation. Radial Veins: No evidence of thrombus. Normal compressibility, respiratory phasicity and response to augmentation. Ulnar Veins: No evidence of thrombus. Normal compressibility, respiratory phasicity and response to augmentation. Venous Reflux:  None visualized. Other Findings:  None visualized. IMPRESSION: 1. No DVT of the LEFT upper extremity. 2. Acute superficial venous thrombosis of the cephalic vein and adjacent varicose vein at the level of the mid upper arm. Electronically Signed   By: Aliene Lloyd M.D.   On: 04/01/2024 07:35   CT ANGIO GI BLEED Result Date: 03/29/2024 CLINICAL DATA:  Lightheadedness, dark stool for 1 month, epigastric pain for several days, history of ovarian cancer EXAM: CTA ABDOMEN AND PELVIS WITHOUT AND WITH CONTRAST TECHNIQUE: Multidetector CT imaging of the abdomen and pelvis was performed using the standard protocol during bolus administration of intravenous contrast. Multiplanar reconstructed images and MIPs were obtained and reviewed to evaluate the vascular anatomy. RADIATION DOSE REDUCTION: This exam was performed according to the departmental dose-optimization program which includes automated exposure control, adjustment of the mA and/or kV according to patient size and/or use of iterative reconstruction technique. CONTRAST:  OMNIPAQUE  IOHEXOL  350 MG/ML SOLN COMPARISON:  02/22/2024 FINDINGS: VASCULAR Aorta: Normal caliber aorta without aneurysm, dissection, vasculitis or significant stenosis. Mild atherosclerosis. Celiac: Patent without evidence of aneurysm,  dissection, vasculitis or significant stenosis. SMA: Patent without evidence of aneurysm, dissection, vasculitis or significant stenosis. Renals: Both renal arteries are patent without evidence of aneurysm, dissection, vasculitis, fibromuscular dysplasia or significant stenosis. IMA: Patent without evidence of aneurysm, dissection, vasculitis or significant stenosis. Inflow: Patent without evidence of aneurysm, dissection, vasculitis or significant stenosis. Evaluation is slightly limited due to streak artifact from lumbar orthopedic hardware. Proximal Outflow: Bilateral common femoral and visualized portions of the superficial and profunda femoral arteries are patent without evidence of aneurysm, dissection, vasculitis or significant stenosis. Veins: Continue mass effect upon the portal vein confluence  due to the large mass between the duodenum and pancreatic head. No other acute venous abnormalities. Review of the MIP images confirms the above findings. NON-VASCULAR Lower chest: Pericardiophrenic lymph node is again identified, measuring 2.4 x 2.2 cm, previously 2.5 x 2.0 cm. No acute airspace disease. Hepatobiliary: No focal liver abnormality is seen. No gallstones, gallbladder wall thickening, or biliary dilatation. Pancreas: Unremarkable. No pancreatic ductal dilatation or surrounding inflammatory changes. Spleen: Normal in size without focal abnormality. Adrenals/Urinary Tract: Kidneys enhance normally. No urinary tract calculi. 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 No left-sided obstruction. 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 No specific follow-up is required. Left adrenal is unremarkable. Bladder is normal. Stomach/Bowel: No bowel obstruction or ileus. Prior sigmoid colon resection and reanastomosis. Previous appendectomy. There is a large necrotic 7.8 x 8.7 x 8.1 cm mass interposed between the head of the pancreas  and duodenal ball. This mass displaces and likely invades the wall of the distal stomach and proximal duodenum, and may be the source for chronic blood loss. However, on today's exam, there is no evidence of contrast accumulation within the bowel lumen to suggest active hemorrhage. Lymphatic: Previous index enlarged lymph node in the right external iliac chain now measures 12 mm in short axis reference image 62/13, previously 9 mm. Stable right external iliac/inguinal lymph node image 69/13 measuring 6 mm in short axis. No new adenopathy is identified. Reproductive: Stable postsurgical changes from hysterectomy. Other: No free fluid or free intraperitoneal gas. Enlarged para incisional hernia in the right supraumbilical region, now containing a short segment of small bowel. No evidence of incarceration or obstruction. Musculoskeletal: Stable postsurgical changes within the lower lumbar spine. There are no acute or destructive bony abnormalities. Reconstructed images demonstrate no additional findings. IMPRESSION: VASCULAR 1. No evidence of active gastrointestinal bleeding. 2.  Aortic Atherosclerosis (ICD10-I70.0). NON-VASCULAR 1. Enlarging necrotic soft tissue mass interposed between the head of the pancreas and proximal duodenum. There is likely invasion of the dorsal wall of the distal stomach and duodenum, and this could be a source for chronic blood loss given clinical history. There is no evidence of active hemorrhage on today's exam. 2. Progressive adenopathy within the pericardiophrenic region and right external iliac chain consistent with worsening metastatic disease. 3. Para incisional hernia in the right supraumbilical region, containing a short segment of small bowel. No incarceration or obstruction. Electronically Signed   By: Ozell Daring M.D.   On: 03/29/2024 16:19   DG Chest 2 View Result Date: 03/29/2024 CLINICAL DATA:  Chest pain. EXAM: CHEST - 2 VIEW COMPARISON:  Chest CT dated 09/19/2022.  FINDINGS: Right-sided Port-A-Cath with tip over central SVC. No focal consolidation, pleural effusion, pneumothorax. The cardiac silhouette is within normal limits. No acute osseous pathology. Degenerative changes of the shoulders. IMPRESSION: No active cardiopulmonary disease. Electronically Signed   By: Vanetta Chou M.D.   On: 03/29/2024 13:01        ALLERGIES:  is allergic to cymbalta [duloxetine hcl], cyclobenzaprine hcl, celexa  [citalopram hydrobromide], fluticasone  furoate-vilanterol, gabapentin, opana  [oxymorphone hcl], and tramadol.  MEDICATIONS:  Current Outpatient Medications  Medication Sig Dispense Refill   albuterol  (VENTOLIN  HFA) 108 (90 Base) MCG/ACT inhaler Inhale 2 puffs into the lungs every 6 (six) hours as needed for shortness of breath.     amphetamine -dextroamphetamine  (ADDERALL ) 20 MG tablet Take 10-20 mg by mouth See admin instructions. Take 20 mg by mouth in the morning and 10 mg between  1-2 PM daily     Ascorbic Acid (VITAMIN C) 1000 MG tablet Take 1,000 mg by mouth daily.     ASHWAGANDHA PO Take 1 capsule by mouth daily.     baclofen  (LIORESAL ) 10 MG tablet Take 10 mg by mouth 3 (three) times daily as needed.     BLACK CURRANT SEED OIL PO Take 1 capsule by mouth daily.     dexamethasone  (DECADRON ) 4 MG tablet Take 1 tablet (4 mg total) by mouth daily. 30 tablet 6   hydroxychloroquine (PLAQUENIL) 200 MG tablet Take 200 mg by mouth 2 (two) times daily.     lidocaine -prilocaine  (EMLA ) cream Apply to affected area once 30 g 3   LORazepam  (ATIVAN ) 0.5 MG tablet Take 0.5 mg by mouth 3 (three) times daily as needed for anxiety.     mirtazapine  (REMERON ) 7.5 MG tablet Take 7.5 mg by mouth at bedtime.     Multiple Vitamin (MULTIVITAMIN WITH MINERALS) TABS tablet Take 1 tablet by mouth daily.     naloxone  (NARCAN ) nasal spray 4 mg/0.1 mL Place 1 spray into the nose as needed (opioid overdose).     omeprazole  (PRILOSEC) 20 MG capsule TAKE 1 CAPSULE BY MOUTH EVERY DAY 90  capsule 1   ondansetron  (ZOFRAN ) 8 MG tablet Take 1 tablet (8 mg total) by mouth every 8 (eight) hours as needed for nausea or vomiting. 30 tablet 1   OVER THE COUNTER MEDICATION Take 2 capsules by mouth daily. [place] advanced supplement     oxyCODONE  ER (XTAMPZA  ER) 13.5 MG C12A Take 13.5 mg by mouth in the morning, at noon, and at bedtime.     Oxycodone  HCl 20 MG TABS Take 20 mg by mouth 5 (five) times daily as needed (pain).     polyethylene glycol (MIRALAX  / GLYCOLAX ) 17 g packet Take 17 g by mouth daily.     prochlorperazine  (COMPAZINE ) 10 MG tablet Take 1 tablet (10 mg total) by mouth every 6 (six) hours as needed for nausea or vomiting. 30 tablet 1   Vitamin D, Ergocalciferol, (DRISDOL) 1.25 MG (50000 UNIT) CAPS capsule Take 50,000 Units by mouth every Sunday.     WIXELA INHUB 250-50 MCG/ACT AEPB Inhale 1 puff into the lungs daily.     No current facility-administered medications for this visit.    VITAL SIGNS: There were no vitals taken for this visit. There were no vitals filed for this visit.  Estimated body mass index is 22.79 kg/m as calculated from the following:   Height as of 05/09/24: 5' 9 (1.753 m).   Weight as of an earlier encounter on 05/16/24: 154 lb 4.8 oz (70 kg).  LABS: CBC:    Component Value Date/Time   WBC 7.5 05/15/2024 0854   WBC 7.0 05/09/2024 1227   HGB 8.3 (L) 05/15/2024 0854   HCT 26.6 (L) 05/15/2024 0854   PLT 362 05/15/2024 0854   MCV 91.1 05/15/2024 0854   NEUTROABS 4.2 05/15/2024 0854   LYMPHSABS 2.5 05/15/2024 0854   MONOABS 0.6 05/15/2024 0854   EOSABS 0.1 05/15/2024 0854   BASOSABS 0.0 05/15/2024 0854   Comprehensive Metabolic Panel:    Component Value Date/Time   NA 136 05/15/2024 0854   K 3.7 05/15/2024 0854   CL 101 05/15/2024 0854   CO2 24 05/15/2024 0854   BUN 17 05/15/2024 0854   CREATININE 0.74 05/15/2024 0854   GLUCOSE 99 05/15/2024 0854   CALCIUM 8.9 05/15/2024 0854   AST 23 05/15/2024 0854  ALT 15 05/15/2024 0854    ALKPHOS 77 05/15/2024 0854   BILITOT <0.2 05/15/2024 0854   PROT 6.9 05/15/2024 0854   ALBUMIN  3.6 05/15/2024 0854    RADIOGRAPHIC STUDIES: DG Chest 2 View Result Date: 05/09/2024 CLINICAL DATA:  Chest pain EXAM: CHEST - 2 VIEW COMPARISON:  March 29, 2024 FINDINGS: The heart size and mediastinal contours are within normal limits. Stable right internal jugular Port-A-Cath. Both lungs are clear. The visualized skeletal structures are unremarkable. IMPRESSION: No active cardiopulmonary disease. Electronically Signed   By: Lynwood Landy Raddle M.D.   On: 05/09/2024 13:59    PERFORMANCE STATUS (ECOG) : 1 - Symptomatic but completely ambulatory  Review of Systems Unless otherwise noted, a complete review of systems is negative.  Physical Exam General: NAD Cardiovascular: regular rate and rhythm Pulmonary: clear ant fields Abdomen: soft, nontender, + bowel sounds GU: no suprapubic tenderness Extremities: no edema, no joint deformities Skin: no rashes Neurological: Weakness but otherwise nonfocal  IMPRESSION: Patient recently hospitalized with GI bleed from known mass invading the small bowel.  She was discharged home with plan to initiate hospice.  However, she transferred care to our cancer center from [facility] with plan for ongoing supportive care/transfusion.  Patient states that she has been doing reasonably well since discharging home.  States that her pain is well-managed on regimen of Xtampza  ER and oxycodone  IR.  Patient says that she is not enrolled in hospice services as she wished to continue pursuing transfusions.  Today, patient tells me that she is interested in workup and any interventions that might improve her bleeding. Question if patient would be a candidate for embolization.  Dr. Babara plans to see patient today to discuss.  Patient says that she opted to discontinue chemotherapy and pursue a more holistic route.  She says she made this decision due to toxicities of  treatment.  She says she would potentially be interested in other chemotherapy or cancer treatments if side effects were more mild.   At baseline, patient lives at home alone.  She says she has good family support.  Advance directives are on file.  She says she would want to remain a full code for now but says that in the event of progressive cancer or if she were enrolled in hospice, she would likely opt for comfort care only/DNR.  PLAN: - Continue current scope of treatment - Supportive care with transfusions - Community palliative care is following - RTC 3 weeks  Case and plan discussed with Dr. Babara  Patient expressed understanding and was in agreement with this plan. She also understands that She can call the clinic at any time with any questions, concerns, or complaints.     Time Total: 30 minutes  Visit consisted of counseling and education dealing with the complex and emotionally intense issues of symptom management and palliative care in the setting of serious and potentially life-threatening illness.Greater than 50%  of this time was spent counseling and coordinating care related to the above assessment and plan.  Signed by: Fonda Mower, PhD, NP-C   "

## 2024-05-17 ENCOUNTER — Encounter: Payer: Self-pay | Admitting: Hematology and Oncology

## 2024-05-17 DIAGNOSIS — D500 Iron deficiency anemia secondary to blood loss (chronic): Secondary | ICD-10-CM | POA: Insufficient documentation

## 2024-05-17 DIAGNOSIS — K921 Melena: Secondary | ICD-10-CM | POA: Insufficient documentation

## 2024-05-17 NOTE — Assessment & Plan Note (Addendum)
 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   Plan to consult IR for feasibility of embolization.

## 2024-05-17 NOTE — Assessment & Plan Note (Signed)
 Symptoms improved after stopping prior to chemotherapy.  Currently intermittent

## 2024-05-17 NOTE — Assessment & Plan Note (Signed)
 Patient has chronic arthralgia due to rheumatoid arthritis.  She is on Plaquenil.  She also is on oxycodone  ER and oxycodone  managed by rheumatologist.  Symptoms are well-controlled.

## 2024-05-17 NOTE — Progress Notes (Addendum)
 " Hematology/Oncology Progress note [phone] [phone]        REFERRING PROVIDER: Lonn Hicks, MD    CHIEF COMPLAINTS/PURPOSE OF CONSULTATION:  Recurrent ovarian cancer.   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 Previous chemotherapy was complicated by cytopenia, peripheral neuropathy, tooth infection  Patient desires further cancer treatments.  She understands that potential side effects of chemotherapy treatments.  Patient understands that her condition is not curable and the prognosis is poor. Check folate receptor alpha IHC status. Bevacizumab  is contraindicated due to her bleeding/obstruction risk.  Consider doxorubicin if FR alpha negative.  I recommend patient to establish care with gynecology oncology  Obtain echo or MUGA study for evaluation of baseline heart function.  Melena Melena secondary to ongoing bleeding from large fungating and ulcerating mass in the duodenum.   Plan to consult IR for feasibility of embolization.    Iron  deficiency anemia due to chronic blood loss Lab Results  Component Value Date   HGB 8.3 (L) 05/15/2024   TIBC 344 05/15/2024   IRONPCTSAT 7 (L) 05/15/2024   FERRITIN 79 05/15/2024      Recommend IV Venofer  weekly x 3. I I discussed about the potential risks including but not limited to allergic reactions/infusion reactions including anaphylactic reactions, diarrhea, phlebitis, high blood pressure, wheezing, SOB, skin rash, weight gain,dark urine, leg swelling, back pain, headache, nausea and fatigue, etc. Patient agree with the plan  She will also get blood work monitored weekly, plan blood transfusion and supportive care if needed.   RA (rheumatoid arthritis) (HCC) Patient has  chronic arthralgia due to rheumatoid arthritis.  She is on Plaquenil.  She also is on oxycodone  ER and oxycodone  managed by rheumatologist.  Symptoms are well-controlled.  Peripheral neuropathy due to chemotherapy Symptoms improved after stopping prior to chemotherapy.  Currently intermittent   Orders Placed This Encounter  Procedures   CBC with Differential (Cancer Center Only)    Standing Status:   Standing    Number of Occurrences:   2    Expiration Date:   05/16/2025   CA 125    Standing Status:   Future    Expected Date:   05/23/2024    Expiration Date:   08/21/2024   CBC with Differential (Cancer Center Only)    Standing Status:   Future    Expected Date:   05/23/2024    Expiration Date:   08/21/2024   CMP (Cancer Center only)    Standing Status:   Future    Expected Date:   05/23/2024    Expiration Date:   08/21/2024   Ambulatory referral to Gynecologic Oncology    Referral Priority:   Routine    Referral Type:   Consultation    Referral Reason:   Specialty Services Required    Number of Visits Requested:   1   Sample to Blood Bank    Standing Status:   Standing    Number of Occurrences:   2    Expiration Date:   05/16/2025   Sample to Blood Bank    Standing Status:   Future    Expected Date:   05/23/2024    Expiration Date:   08/21/2024    All questions were answered. The patient knows to call the clinic with any problems, questions or concerns.  Zelphia Cap, MD,  PhD [facility] Hematology Oncology 05/16/2024    HISTORY OF PRESENTING ILLNESS:  [name] 69 y.o. female presents to establish care for recurrent ovarian cancer  Patient previously followed up with Dr.Gorsush.  Patient was seen by me during her recent hospitalization at [facility] and switched oncology care to me on 05/17/2024 Extensive medical record review was performed by me   I have reviewed her chart and materials related to her cancer extensively and collaborated history with the patient. Summary of  oncologic history is as follows: Oncology History Overview Note  High grade serous, p53 mutated Neg genetics, HRD not detected, MSI stable, low TMB 2, progressed on carboplatin  and gemcitabine , paclitaxel    Malignant neoplasm of both ovaries (HCC)  04/11/2022 Imaging   CT of the abdomen and pelvis on 04/11/2022 reveals a left adnexal masslike area measuring 5.9 x 3.6 cm.  Margins of this mass are irregular.  There is small fluid in the cul-de-sac as well as fullness of soft tissue about the right adnexa.  Discrete uterine structure is not identified.  Left upper quadrant nodules beneath the left hemidiaphragm (1 anterior to the spleen and the other to the stomach).  Omental/peritoneal nodularity and multiple perihepatic nodules are noted    04/18/2022 Tumor Marker   Patient's tumor was tested for the following markers: CA-125. Results of the tumor marker test revealed 123.   04/19/2022 Initial Diagnosis   Carcinomatosis (HCC)   04/28/2022 Imaging   1. Bulky bilateral ovarian masses and nodules, left-greater-than-right, which are essentially confluent with adjacent pelvic soft tissue nodularity and not significantly changed compared to prior examination dated 04/11/2022. 2. Extensive pelvic peritoneal thickening and nodularity. Multiple small peritoneal nodules throughout the abdomen and pelvis. Findings are consistent with peritoneal metastatic disease and likewise not significantly changed. 3. Small volume of loculated appearing fluid in the low pelvis. 4. 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 The mid to distal right ureter is obstructed by right ovarian mass and or soft tissue nodularity. 5. Calculus within the most distal left ureterovesicular junction or just within the bladder lumen measuring 0.7 cm, unchanged. No associated left-sided hydronephrosis. 6. Soft tissue attenuation nodule  of the body of the right adrenal gland. Notably, this was also soft tissue attenuation on prior noncontrast examination (i.e. not definitively macroscopic fat containing) although unchanged compared to prior examinations dating back to 2018 and almost certainly a benign adenoma. Attention on follow-up. 7. No evidence of metastatic disease in the chest. 8. Mild diffuse bilateral bronchial wall thickening. Background of very fine centrilobular nodularity, most concentrated in the lung apices. Findings are most consistent with smoking-related respiratory bronchiolitis.     05/18/2022 Procedure   Ultrasound-guided core biopsy performed of a 10 mm soft tissue peritoneal mass in the right upper quadrant just superficial to the right lobe of the liver. The procedure was performed under general anesthesia immediately following Port-A-Cath placement.   05/18/2022 Procedure   Placement of single lumen port a cath via right internal jugular vein. The catheter tip lies at the cavo-atrial junction. A power injectable port a cath was placed and is ready for immediate use.     05/18/2022 Pathology Results   A. 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  The findings support the above interpretation.  This  case was reviewed with Dr. Rebbecca who agrees with the above diagnosis.  A p16 stain is pending and will be reported in an addendum.    05/23/2022 Cancer Staging   Staging form: Ovary, Fallopian Tube, and Primary Peritoneal Carcinoma, AJCC 8th Edition - Clinical stage from 05/23/2022: FIGO Stage IIIC (cT3c, cN0, cM0) - Signed by Lonn Hicks, MD on 05/23/2022 Stage prefix: Initial diagnosis   05/29/2022 Imaging   1. Complex bilateral adnexal masses, as described above, consistent with the patient's known malignancy. 2. Subsequent marked severity  mass effect on the distal bilateral common iliac veins, increased in severity when compared to the prior exam. 3. Additional findings consistent with peritoneal metastasis within the pelvis. 4. Findings consistent with pelvic congestion syndrome. 5. Postoperative changes within the mid and lower lumbar spine. 6. Aortic atherosclerosis. 7. No evidence of venous thrombus within the abdomen, pelvis or proximal bilateral lower extremities.   Aortic Atherosclerosis (ICD10-I70.0).   06/02/2022 - 11/03/2022 Chemotherapy   Patient is on Treatment Plan : OVARIAN Carboplatin  (AUC 6) + Paclitaxel  (175) q21d X 6 Cycles     06/26/2022 Tumor Marker   Patient's tumor was tested for the following markers: CA-125. Results of the tumor marker test revealed 134.   08/04/2022 Imaging   CT ABDOMEN PELVIS W CONTRAST  Result Date: 08/04/2022 CLINICAL DATA:  Ovarian cancer, chemotherapy in progress, for restaging EXAM: CT ABDOMEN AND PELVIS WITH CONTRAST TECHNIQUE: Multidetector CT imaging of the abdomen and pelvis was performed using the standard protocol following bolus administration of intravenous contrast. RADIATION DOSE REDUCTION: This exam was performed according to the departmental dose-optimization program which includes automated exposure control, adjustment of the mA and/or kV according to patient size and/or use of iterative reconstruction technique. CONTRAST:  OMNIPAQUE  IOHEXOL  300 MG/ML  SOLN COMPARISON:  CT venogram abdomen/pelvis dated 05/29/2022. CT abdomen/pelvis dated 04/26/2022. FINDINGS: Lower chest: Lung bases are clear. Hepatobiliary: Liver is within normal limits. Gallbladder is unremarkable. No intrahepatic or extrahepatic ductal dilatation. Pancreas: Within normal limits. Spleen: Within normal limits. Adrenals/Urinary Tract: 2.4 cm right adrenal nodule (series 2/image 17), previously 2.0 cm in 2018, likely reflecting a benign adrenal adenoma. Left adrenal gland is within normal limits. Left  kidney is within normal limits. 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 Associated extrinsic compression at the level of the right adnexal mass (described below). Mildly thick-walled bladder, although underdistended. Stable 6 mm left UVJ calcification (series 2/image 68). Stomach/Bowel: Stomach is within normal limits. No evidence of bowel obstruction. Normal appendix (series 2/image 47). Left adnexal mass (described below) is favored to abut and directly invade the left lateral wall of the sigmoid colon (series 2/image 57). Vascular/Lymphatic: No evidence of abdominal aortic aneurysm. Atherosclerotic calcifications of the abdominal aorta and branch vessels. No suspicious abdominopelvic lymphadenopathy. Reproductive: Heterogeneous uterus with associated surface nodularity (series 2/image 65), likely reflecting peritoneal disease. This appearance is improved from the prior. 3.9 x 3.3 cm left adnexal mass (series 2/image 60), previously 5.3 x 3.9 cm, with extension along the sigmoid mesocolon (series 2/image 56) and suspected involvement of the sigmoid colon (series 2/image 57). 5.6 x 2.7 cm mixed cystic/solid right adnexal mass (series 2/image 89), previously 6.9 x 4.3 cm when measured in a similar fashion. Other: No abdominopelvic ascites. Scattered minimal peritoneal nodularity beneath the anterior abdominal wall, including a 5 mm nodule beneath the left anterior abdominal wall (series 2/image 27) which previously measured 12 mm. Additional mild nodularity  on series 2/images 32, 33, and 50. Musculoskeletal: Status post PLIF at L4-S1. Status post lateral fixation at L4-5. Status post anterior fixation at L5-S1. Mild degenerative changes of the lower thoracic spine. IMPRESSION: Improving bilateral adnexa masses in this patient with known ovarian cancer, as above. Suspected involvement of the sigmoid colon. No evidence of  bowel obstruction. Improving peritoneal nodularity, as above. Additional ancillary findings as above. Electronically Signed   By: Pinkie Pebbles M.D.   On: 08/04/2022 01:27      08/16/2022 Pathology Results   CASE: [id number] PATIENT: [name] Surgical Pathology Report  Clinical History: Advanced gyn malignancy (crm)  FINAL MICROSCOPIC DIAGNOSIS:  A. OMENTUM, RESECTION: - Positive for carcinoma  B. LIVER ADHESION, EXCISION: - Benign fibroadipose tissue - Negative for carcinoma  C. SMALL BOWEL MESENTERIC IMPLANT, EXCISION: - Positive for carcinoma with extensive necrosis  D. UTERUS, CERVIX, BILATERAL FALLOPIAN TUBES AND OVARIES, AND APPENDIX, RESECTION: - Uterine serosa: Tumor deposits present - Cervix: Benign, nabothian cyst - Endometrium: Benign inactive endometrium, benign endometrial polyp - Myometrium: Leiomyoma - Right fallopian tube: Metastatic tumor deposits present - Right ovary: High-grade serous carcinoma - Left ovary: High-grade serous carcinoma - Benign appendix - See oncology table  E. COLON, RECTOSIGMOID, RESECTION: - Benign colonic mucosa - Margins free of carcinoma - Adherent fallopian tube with metastatic tumor deposits - 2 benign lymph nodes  F. RECTAL TUMOR IMPLANT, EXCISION: - Metastatic high-grade serous carcinoma   G. COLON, RECTOSIGMOID ANASTOMOTIC RINGS: - Benign anastomotic donuts  ONCOLOGY TABLE:   OVARY or FALLOPIAN TUBE or PRIMARY PERITONEUM: Resection  Procedure: Total abdominal hysterectomy, bilateral salpingo-oophorectomy, appendectomy, rectosigmoid resection, total omentectomy, small bowel nodule resection Specimen Integrity: Intact Tumor Site: Bilateral Tumor Size: Right ovary: 3.9 cm, left ovary: Approximately 1.2 cm Histologic Type: High-grade serous carcinoma Histologic Grade: High-grade Ovarian Surface Involvement: Present Fallopian Tube Surface Involvement: Present Implants: Present, small bowel mesenteric  and rectovaginal septum Lymphatic and/or Vascular Invasion: Not identified Other Tissue/ Organ Involvement: Fallopian tube, bilateral Largest Extrapelvic Peritoneal Focus: 1.4 cm, small bowel mesentery Peritoneal/Ascitic Fluid Involvement: Not applicable Chemotherapy Response Score (CRS): Not applicable, no known presurgical therapy Regional Lymph Nodes: Not applicable (no lymph nodes submitted or found) Distant Metastasis:      Distant Site(s) Involved: Not applicable Pathologic Stage Classification (pTNM, AJCC 8th Edition): pT3b, pN[not assigned] Ancillary Studies: Can be performed upon request Representative Tumor Block: D8 Comment(s): Tumor is staged as pT3 given the presence of extrapelvic peritoneal deposits on small bowel mesentery less than 2cm (1.4 cm) ([id number])    09/18/2022 Tumor Marker   Patient's tumor was tested for the following markers: CA-125. Results of the tumor marker test revealed 36.8.   10/15/2022 Genetic Testing   Negative [facility] Multi-Cancer +RNA Panel.  VUS in POLD1 at c.1322C>T (p.Thr441Met). Report date is 10/15/2022.   The Multi-Cancer + RNA Panel offered by [facility] includes sequencing and/or deletion/duplication analysis of the following 70 genes:  AIP*, ALK, APC*, ATM*, AXIN2*, BAP1*, BARD1*, BLM*, BMPR1A*, BRCA1*, BRCA2*, BRIP1*, CDC73*, CDH1*, CDK4, CDKN1B*, CDKN2A, CHEK2*, CTNNA1*, DICER1*, EPCAM (del/dup only), EGFR, FH*, FLCN*, GREM1 (promoter dup only), HOXB13, KIT, LZTR1, MAX*, MBD4, MEN1*, MET, MITF, MLH1*, MSH2*, MSH3*, MSH6*, MUTYH*, NF1*, NF2*, NTHL1*, PALB2*, PDGFRA, PMS2*, POLD1*, POLE*, POT1*, PRKAR1A*, PTCH1*, PTEN*, RAD51C*, RAD51D*, RB1*, RET, SDHA* (sequencing only), SDHAF2*, SDHB*, SDHC*, SDHD*, SMAD4*, SMARCA4*, SMARCB1*, SMARCE1*, STK11*, SUFU*, TMEM127*, TP53*, TSC1*, TSC2*, VHL*. RNA analysis is performed for * genes.   11/06/2022 Tumor Marker   Patient's tumor was tested for the following  markers: CA-125. Results of the tumor marker test  revealed 16.8.   12/11/2022 Tumor Marker   Patient's tumor was tested for the following markers: CA-125. Results of the tumor marker test revealed 12.5.   12/14/2022 Imaging   CT ABDOMEN PELVIS W CONTRAST  Result Date: 12/13/2022 CLINICAL DATA:  Ovarian cancer restaging * Tracking Code: BO * EXAM: CT ABDOMEN AND PELVIS WITH CONTRAST TECHNIQUE: Multidetector CT imaging of the abdomen and pelvis was performed using the standard protocol following bolus administration of intravenous contrast. RADIATION DOSE REDUCTION: This exam was performed according to the departmental dose-optimization program which includes automated exposure control, adjustment of the mA and/or kV according to patient size and/or use of iterative reconstruction technique. CONTRAST:  OMNIPAQUE  IOHEXOL  300 MG/ML  SOLN COMPARISON:  08/02/2022 FINDINGS: Lower chest: Unremarkable Hepatobiliary: Unremarkable Pancreas: Unremarkable Spleen: Unremarkable Adrenals/Urinary Tract: 2.6 by 2.1 cm right adrenal mass, internal density 55 Hounsfield units on portal venous phase images, relative washout of 13% which is indeterminate. Back in 2016 I measure this lesion at 2.2 by 1.6 cm, on the more recent comparison of 07/18/2022 this lesion measured 2.6 by 2.1 cm similar to today. Accordingly this lesion is stable from March and only very minimally increased in size from 2016, most likely benign. This can likely be surveilled in the context of the patient's ongoing oncology CT examinations. 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 The ureters transition to relatively normal caliber in the vicinity of the iliac vessel cross over, a specific cause for the transition is not identified. There is also a duplicated left renal collecting system without hydroureter on the left Stable 7 mm calcification or stone in the vicinity of the left  ureterovesical junction. Stomach/Bowel: Prominent stool throughout the colon favors constipation. Anastomotic staple line in the sigmoid colon. No dilated small bowel. Interval appendectomy. Previous nodularity along bowel including the sigmoid colon no longer identified. Vascular/Lymphatic: Atherosclerosis is present, including aortoiliac atherosclerotic disease. No pathologic adenopathy. Reproductive: Interval hysterectomy with salpingo-oophorectomy. Other: Interval omentectomy. No current compelling findings of peritoneal tumor deposits. No ascites. Musculoskeletal: Prior fusion at L4-L5-S1. IMPRESSION: 1. Interval hysterectomy, omentectomy, and appendectomy. No compelling findings of recurrent or residual peritoneal tumor deposits. 2. 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 A specific cause for the transition in caliber is not identified. 3. Stable 7 mm calcification or stone in the vicinity of the left ureterovesical junction. Duplicated left renal collecting system. 4. Prominent stool throughout the colon favors constipation. 5. Chronically stable but otherwise nonspecific right adrenal mass, likely benign. 6. Aortic atherosclerosis. Aortic Atherosclerosis (ICD10-I70.0). Electronically Signed   By: Ryan Salvage M.D.   On: 12/13/2022 15:16      12/20/2022 Procedure   Successful removal of a RIGHT chest implanted Port-A-Cath.    03/19/2023 Tumor Marker   Patient's tumor was tested for the following markers: CA-125. Results of the tumor marker test revealed 19.7.   06/14/2023 Imaging   CT ABDOMEN PELVIS W CONTRAST Result Date: 06/21/2023 CLINICAL DATA:  Restaging ovarian cancer.  * Tracking Code: BO * EXAM: CT ABDOMEN AND PELVIS WITH CONTRAST TECHNIQUE: Multidetector CT imaging of the abdomen and pelvis was performed using the standard  protocol following bolus administration of intravenous contrast. RADIATION DOSE REDUCTION: This exam was performed according to  the departmental dose-optimization program which includes automated exposure control, adjustment of the mA and/or kV according to patient size and/or use of iterative reconstruction technique. CONTRAST:  OMNIPAQUE  IOHEXOL  300 MG/ML  SOLN COMPARISON:  CT abdomen and pelvis dated 12/08/2022 FINDINGS: Lower chest: No focal consolidation or pulmonary nodule in the lung bases. No pleural effusion or pneumothorax demonstrated. Partially imaged heart size is normal. Hepatobiliary: No focal hepatic lesions. No intra or extrahepatic biliary ductal dilation. Normal gallbladder. Pancreas: No focal lesions or main ductal dilation. Spleen: Normal in size without focal abnormality. Adrenals/Urinary Tract: Unchanged 2.0 cm right adrenal nodule measuring 43 HU (2:16). No left adrenal nodule. 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 Duplex left kidney. No hydronephrosis. Unchanged 7 mm calcification adjacent to the left ureterovesical junction. No focal bladder wall thickening. Stomach/Bowel: Normal appearance of the stomach. Postsurgical changes from rectosigmoid resection with patent anastomosis. No evidence of bowel wall thickening, distention, or inflammatory changes. Appendectomy. Vascular/Lymphatic: Aortic atherosclerosis. New and increased size of multi station lymphadenopathy and peritoneal nodules, for example: -12 mm cardiophrenic (2:2), previously partially imaged at 3 mm -new 12 mm midline epigastric anterior peritoneal (2:23) -new 3.1 x 2.9 cm peripancreatic (2:25) -14 mm right pelvic sidewall (2:56), previously 4 mm -11 mm right external iliac (2:60), previously 7 mm Reproductive: Status post hysterectomy and bilateral salpingo-oophorectomy. No adnexal masses. Increased lobulated soft tissue density adjacent to the right vaginal cuff measuring 2.9 x 1.8 cm (2:64),  previously 2.4 x 1.7 cm Other: Status post total omentectomy. No free fluid, fluid collection, or free air. Musculoskeletal: No acute or abnormal lytic or blastic osseous lesions. Postsurgical changes of L4-S1 spinal fixation. Hardware appears intact. Postsurgical changes of the anterior abdominal wall. IMPRESSION: 1. New and increased size of multi station lymphadenopathy and peritoneal nodules, consistent with metastatic disease. 2. Increased lobulated soft tissue density adjacent to the right vaginal cuff measuring 2.9 x 1.8 cm, previously 2.4 x 1.7 cm, suspicious for recurrent disease. 3. Unchanged 2.0 cm right adrenal nodule, favored benign. Recommend continued attention on follow-up. 4. 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 Duplex left kidney. No hydronephrosis. Unchanged 7 mm calcification adjacent to the left ureterovesical junction. 5.  Aortic Atherosclerosis (ICD10-I70.0). Electronically Signed   By: Limin  Xu M.D.   On: 06/21/2023 09:35      06/18/2023 Tumor Marker   Patient's tumor was tested for the following markers: CA-125. Results of the tumor marker test revealed 32.9.   07/03/2023 Procedure   Successful placement of a power injectable Port-A-Cath via the right internal jugular vein. The catheter is ready for immediate use.   07/04/2023 - 08/31/2023 Chemotherapy   Patient is on Treatment Plan : OVARIAN RECURRENT 3RD LINE Bevacizumab  D1 + Carboplatin  D1 + Gemcitabine  D1,8 (4/800) q21d     07/13/2023 Tumor Marker   Patient's tumor was tested for the following markers: CA-125. Results of the tumor marker test revealed 33.2.   08/17/2023 Tumor Marker   Patient's tumor was tested for the following markers: CA-125. Results of the tumor marker test revealed 16.1.   08/30/2023 Imaging   1. Status post hysterectomy and bilateral salpingo-oophorectomy. 2. Multistation adenopathy and peritoneal nodularity is again noted, compatible with metastatic disease. Mild increased  size of peripancreatic node. Upper abdominal peritoneal nodule is slightly decreased in size in the interval. Additional index lymph nodes are stable in the interval. 3. Increased soft tissue density adjacent to the vaginal cuff, concerning for locally recurrent disease. 4. Duplex right kidney with mild  hydronephrosis and hydroureter, unchanged. Stone within the left UVJ is again noted measuring 0.8 x 0.4 cm, unchanged from previous exam. 5. Stable right adrenal nodule.  None 6. Supraumbilical ventral abdominal wall hernia containing a nonobstructed loop of small bowel. 7.  Aortic Atherosclerosis (ICD10-I70.0).       09/27/2023 - 11/15/2023 Chemotherapy   Patient is on Treatment Plan : OVARIAN Paclitaxel  (80) I8,1,84,77 q28d     09/28/2023 Tumor Marker   Patient's tumor was tested for the following markers: CA-125. Results of the tumor marker test revealed 29.8.   11/05/2023 Tumor Marker   Patient's tumor was tested for the following markers: CA-125. Results of the tumor marker test revealed 40.   11/16/2023 Tumor Marker   Patient's tumor was tested for the following markers: CA-125. Results of the tumor marker test revealed 32.3.   11/22/2023 Imaging   CT ABDOMEN PELVIS W CONTRAST Result Date: 11/28/2023 CLINICAL DATA:  Ovarian cancer.  * Tracking Code: BO * EXAM: CT ABDOMEN AND PELVIS WITH CONTRAST TECHNIQUE: Multidetector CT imaging of the abdomen and pelvis was performed using the standard protocol following bolus administration of intravenous contrast. RADIATION DOSE REDUCTION: This exam was performed according to the departmental dose-optimization program which includes automated exposure control, adjustment of the mA and/or kV according to patient size and/or use of iterative reconstruction technique. CONTRAST:  OMNIPAQUE  IOHEXOL  300 MG/ML  SOLN COMPARISON:  08/30/2023. FINDINGS: Lower chest: Lung bases are clear. Prepericardiac lymph node measures 1.6 cm, enlarged from 1.1 cm  on 08/30/2023. Heart is at the upper limits of normal in size. No pericardial or pleural effusion. Distal esophagus is grossly unremarkable. Hepatobiliary: Liver and gallbladder are unremarkable. No biliary ductal dilatation. Pancreas: Negative. Spleen: Negative. Adrenals/Urinary Tract: 2.4 cm right adrenal nodule, chronically stable and characterized previously 2 as an adenoma. No specific follow-up necessary. Minimal nodular thickening of the left adrenal gland, unchanged. Minimal scarring in the right kidney. Kidneys are otherwise unremarkable. Difficulty did right ureter appears to join in the midportion. Right ureter is decompressed. Left ureter may be minimally dilated secondary to an 8 mm stone at the left ureteral orifice, unchanged from 08/30/2023. Bladder is otherwise grossly unremarkable. Stomach/Bowel: Stomach, small bowel and colon are unremarkable. Appendix is not readily visualized. Vascular/Lymphatic: Atherosclerotic calcification of the aorta. Heterogeneous nodal mass or peritoneal implant between the distal stomach and pancreas measures 3.7 x 5.3 cm (2/29), enlarged from 3.0 x 3.7 cm. Right external iliac lymph node measures 7 mm (2/58), previously 12 mm. A second right external iliac lymph node measures 7 mm (2/64), stable. Reproductive: Hysterectomy. Similar asymmetric nodular soft tissue along the right vaginal cuff (2/66-67). No adnexal mass. Other: Previously seen ventral midline epigastric peritoneal nodule is no longer visualized. No free fluid. Small incisional hernia contains fat along the ventral supraumbilical midline. Musculoskeletal: Osteopenia. Degenerative changes in the spine. L5-S1 fusion. IMPRESSION: 1. Enlarging prepericardiac nodal metastasis. Enlarging nodal mass or peritoneal implant between the distal stomach and pancreas. 2. Resolved ventral midline epigastric peritoneal metastasis. 3. Right external iliac lymph nodes are stable to smaller in size. 4. Asymmetric nodular  thickening of the right vaginal cuff, stable. 5. Trace left ureteral dilatation with a chronic 8 mm stone at the left ureteral orifice. 6. Stable right adrenal nodule, previously characterized as an adenoma. 7.  Aortic atherosclerosis (ICD10-I70.0). Electronically Signed   By: Newell Eke M.D.   On: 11/28/2023 13:56      02/22/2024 Imaging   CT ABDOMEN PELVIS W CONTRAST Result Date:  02/24/2024 CLINICAL DATA:  Ovarian cancer, monitor, off chemotherapy. * Tracking Code: BO * EXAM: CT ABDOMEN AND PELVIS WITH CONTRAST TECHNIQUE: Multidetector CT imaging of the abdomen and pelvis was performed using the standard protocol following bolus administration of intravenous contrast. RADIATION DOSE REDUCTION: This exam was performed according to the departmental dose-optimization program which includes automated exposure control, adjustment of the mA and/or kV according to patient size and/or use of iterative reconstruction technique. CONTRAST:  OMNIPAQUE  IOHEXOL  300 MG/ML  SOLN COMPARISON:  Multiple priors including CT November 22, 2023 FINDINGS: Lower chest: No acute abnormality. Centrally necrotic pericardiophrenic lymph node measures 18 mm in short axis on image 3/2 previously 16 mm Hepatobiliary: No suspicious hepatic lesion. Gallbladder is unremarkable. No biliary ductal dilation. Pancreas: No pancreatic ductal dilation or evidence of acute inflammation. Spleen: No splenomegaly. Adrenals/Urinary Tract: 2.1 cm right adrenal nodule on image 20/2 stable over multiple prior examinations and previously characterized as a benign adenoma. Left adrenal gland appears normal. 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 Similar prominence of the left ureter with 8 mm stone at the UVJ. Stomach/Bowel: Stomach is minimally distended limiting evaluation. No pathologic dilation of small or large bowel. No evidence of acute bowel inflammation. Vascular/Lymphatic: Normal caliber  abdominal aorta. Smooth IVC contours. Narrowing of the portal vein and portal confluence by a heterogeneous enlarged lymph node/mass which measures 7.4 x 6.5 cm on image 27/2 previously 4.7 x 4.1 cm when remeasured for consistency, this sits between the pancreatic head and distal stomach. Previously indexed right external iliac lymph node measures 9 mm in short axis on image 58/2 previously 7 mm -additional right external iliac lymph node previously indexed measures 6 mm in short axis on image 66/2 previously 7 mm. Reproductive: Prior hysterectomy with similar asymmetric nodular soft tissue on the right side of the vaginal cuff on image 67/2. no suspicious adnexal mass. Other: No significant abdominopelvic free fluid. Postsurgical change in the anterior abdominal wall with fat containing incisional hernia. Musculoskeletal: No aggressive lytic or blastic lesion of bone. Spinal fusion hardware. Diffuse demineralization of bone. Thoracolumbar spondylosis. IMPRESSION: 1. Interval increase in size of the heterogeneous enlarged lymph node/mass between the pancreatic head and distal stomach. 2. Slight interval increase in size of the centrally necrotic pericardiophrenic lymph node. 3. Previously indexed right external iliac lymph nodes are similar to slightly increased in size. 4. Prior hysterectomy with similar asymmetric nodular soft tissue on the right side of the vaginal cuff. 5. Similar prominence of the left ureter with 8 mm stone at the UVJ. 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 Electronically Signed   By: Reyes Holder M.D.   On: 02/24/2024 13:03   MR BRAIN W WO CONTRAST Result Date: 02/18/2024 CLINICAL DATA:  Multiple sclerosis. EXAM: MRI HEAD WITHOUT AND WITH CONTRAST TECHNIQUE: Multiplanar, multiecho pulse sequences of the brain and surrounding structures were obtained without and with intravenous contrast. CONTRAST:  8 mL Vueway  COMPARISON:  Head MRI 10/26/2022  FINDINGS: Brain: There is no evidence of an acute infarct, intracranial hemorrhage, mass, midline shift, or extra-axial fluid collection. Cerebral volume is within normal limits for age. The ventricles are normal in size. T2 hyperintensities in the juxtacortical, periventricular, and deep cerebral white matter bilaterally and in the pons are unchanged and moderately advanced for age. None show enhancement or restricted diffusion. Mild T2 heterogeneity in the thalami and at most minimal in the cerebellum are unchanged. Vascular: Major intracranial vascular flow  voids are preserved. Skull and upper cervical spine: Unremarkable bone marrow signal. Sinuses/Orbits: Unremarkable orbits. Paranasal sinuses and mastoid air cells are clear. Other: None. IMPRESSION: Unchanged moderately advanced white matter disease, nonspecific but may reflect a combination chronic demyelinating disease and chronic small vessel ischemia. No evidence of active demyelination. Electronically Signed   By: Dasie Hamburg M.D.   On: 02/18/2024 08:06      03/29/2024 Imaging   US  Venous Img Upper Uni Left (DVT) Result Date: 04/01/2024 CLINICAL DATA:  LEFT upper extremity pain. History of tobacco use and ovarian malignancy. EXAM: LEFT UPPER EXTREMITY VENOUS DOPPLER ULTRASOUND TECHNIQUE: Gray-scale sonography with graded compression, as well as color Doppler and duplex ultrasound were performed to evaluate the upper extremity deep venous system from the level of the subclavian vein and including the jugular, axillary, basilic, radial, ulnar and upper cephalic vein. Spectral Doppler was utilized to evaluate flow at rest and with distal augmentation maneuvers. COMPARISON:  None available FINDINGS: Internal Jugular Vein: No evidence of thrombus. Normal compressibility, respiratory phasicity and response to augmentation. Subclavian Vein: No evidence of thrombus. Normal compressibility, respiratory phasicity and response to augmentation. Axillary Vein:  No evidence of thrombus. Normal compressibility, respiratory phasicity and response to augmentation. Cephalic Vein: Intramural filling defect with decreased flow and compressibility of the cephalic vein and adjacent varicose vein at the level of the mid upper arm is consistent with acute DVT. This also corresponds to the patient's palpable abnormality. Basilic Vein: No evidence of thrombus. Normal compressibility, respiratory phasicity and response to augmentation. Brachial Veins: No evidence of thrombus. Normal compressibility, respiratory phasicity and response to augmentation. Radial Veins: No evidence of thrombus. Normal compressibility, respiratory phasicity and response to augmentation. Ulnar Veins: No evidence of thrombus. Normal compressibility, respiratory phasicity and response to augmentation. Venous Reflux:  None visualized. Other Findings:  None visualized. IMPRESSION: 1. No DVT of the LEFT upper extremity. 2. Acute superficial venous thrombosis of the cephalic vein and adjacent varicose vein at the level of the mid upper arm. Electronically Signed   By: Aliene Lloyd M.D.   On: 04/01/2024 07:35   CT ANGIO GI BLEED Result Date: 03/29/2024 CLINICAL DATA:  Lightheadedness, dark stool for 1 month, epigastric pain for several days, history of ovarian cancer EXAM: CTA ABDOMEN AND PELVIS WITHOUT AND WITH CONTRAST TECHNIQUE: Multidetector CT imaging of the abdomen and pelvis was performed using the standard protocol during bolus administration of intravenous contrast. Multiplanar reconstructed images and MIPs were obtained and reviewed to evaluate the vascular anatomy. RADIATION DOSE REDUCTION: This exam was performed according to the departmental dose-optimization program which includes automated exposure control, adjustment of the mA and/or kV according to patient size and/or use of iterative reconstruction technique. CONTRAST:  OMNIPAQUE  IOHEXOL  350 MG/ML SOLN COMPARISON:  02/22/2024 FINDINGS:  VASCULAR Aorta: Normal caliber aorta without aneurysm, dissection, vasculitis or significant stenosis. Mild atherosclerosis. Celiac: Patent without evidence of aneurysm, dissection, vasculitis or significant stenosis. SMA: Patent without evidence of aneurysm, dissection, vasculitis or significant stenosis. Renals: Both renal arteries are patent without evidence of aneurysm, dissection, vasculitis, fibromuscular dysplasia or significant stenosis. IMA: Patent without evidence of aneurysm, dissection, vasculitis or significant stenosis. Inflow: Patent without evidence of aneurysm, dissection, vasculitis or significant stenosis. Evaluation is slightly limited due to streak artifact from lumbar orthopedic hardware. Proximal Outflow: Bilateral common femoral and visualized portions of the superficial and profunda femoral arteries are patent without evidence of aneurysm, dissection, vasculitis or significant stenosis. Veins: Continue mass effect upon the portal vein confluence  due to the large mass between the duodenum and pancreatic head. No other acute venous abnormalities. Review of the MIP images confirms the above findings. NON-VASCULAR Lower chest: Pericardiophrenic lymph node is again identified, measuring 2.4 x 2.2 cm, previously 2.5 x 2.0 cm. No acute airspace disease. Hepatobiliary: No focal liver abnormality is seen. No gallstones, gallbladder wall thickening, or biliary dilatation. Pancreas: Unremarkable. No pancreatic ductal dilatation or surrounding inflammatory changes. Spleen: Normal in size without focal abnormality. Adrenals/Urinary Tract: Kidneys enhance normally. No urinary tract calculi. 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 No left-sided obstruction. 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 No specific follow-up is required. Left adrenal is unremarkable. Bladder is normal. Stomach/Bowel: No bowel obstruction or  ileus. Prior sigmoid colon resection and reanastomosis. Previous appendectomy. There is a large necrotic 7.8 x 8.7 x 8.1 cm mass interposed between the head of the pancreas and duodenal ball. This mass displaces and likely invades the wall of the distal stomach and proximal duodenum, and may be the source for chronic blood loss. However, on today's exam, there is no evidence of contrast accumulation within the bowel lumen to suggest active hemorrhage. Lymphatic: Previous index enlarged lymph node in the right external iliac chain now measures 12 mm in short axis reference image 62/13, previously 9 mm. Stable right external iliac/inguinal lymph node image 69/13 measuring 6 mm in short axis. No new adenopathy is identified. Reproductive: Stable postsurgical changes from hysterectomy. Other: No free fluid or free intraperitoneal gas. Enlarged para incisional hernia in the right supraumbilical region, now containing a short segment of small bowel. No evidence of incarceration or obstruction. Musculoskeletal: Stable postsurgical changes within the lower lumbar spine. There are no acute or destructive bony abnormalities. Reconstructed images demonstrate no additional findings. IMPRESSION: VASCULAR 1. No evidence of active gastrointestinal bleeding. 2.  Aortic Atherosclerosis (ICD10-I70.0). NON-VASCULAR 1. Enlarging necrotic soft tissue mass interposed between the head of the pancreas and proximal duodenum. There is likely invasion of the dorsal wall of the distal stomach and duodenum, and this could be a source for chronic blood loss given clinical history. There is no evidence of active hemorrhage on today's exam. 2. Progressive adenopathy within the pericardiophrenic region and right external iliac chain consistent with worsening metastatic disease. 3. Para incisional hernia in the right supraumbilical region, containing a short segment of small bowel. No incarceration or obstruction. Electronically Signed   By: Ozell Daring M.D.   On: 03/29/2024 16:19   DG Chest 2 View Result Date: 03/29/2024 CLINICAL DATA:  Chest pain. EXAM: CHEST - 2 VIEW COMPARISON:  Chest CT dated 09/19/2022. FINDINGS: Right-sided Port-A-Cath with tip over central SVC. No focal consolidation, pleural effusion, pneumothorax. The cardiac silhouette is within normal limits. No acute osseous pathology. Degenerative changes of the shoulders. IMPRESSION: No active cardiopulmonary disease. Electronically Signed   By: Vanetta Chou M.D.   On: 03/29/2024 13:01       Discussed the use of AI scribe software for clinical note transcription with the patient, who gave verbal consent to proceed.   She continues to experience melena. Her anemia has been managed with intermittent blood transfusions, She experiences intermittent fatigue but denies new or worsening symptoms such as hypertension, nausea, headache, chest tightness, leg edema, or fever.  Patient now lives in [place] and would like to transfer her care to be closer at home.to be closer to home.  Appetite is fair.  She has lost weight.  She  is accompanied by her sister Tillman today.  Patient expresses desire of continue cancer treatments which may improve her symptoms or prolong her life.   MEDICAL HISTORY:  Past Medical History:  Diagnosis Date   Anxiety    Asthma    Back problem    disc disease, chronic low back pain, right leg pain, s/p discectomy 99   Cancer (HCC)    ovarian cancer   Depression    Diastolic dysfunction    with elevated LVEDP at cath   Endometriosis    Fibroids    hx of    GERD (gastroesophageal reflux disease)    Heart murmur    Hepatitis C    treated 8-9 years ago   Hidradenitis suppurativa    History of kidney stones    Hypertension    MS (multiple sclerosis)    Neuromuscular disorder (HCC)    Mulitple sclerosis   Osteoarthritis    Pre-diabetes    Prediabetes    RA (rheumatoid arthritis) (HCC)    Rotator cuff tear    right shoulder    Syncope      SURGICAL HISTORY: Past Surgical History:  Procedure Laterality Date   ANGIOPLASTY     BACK SURGERY     lumbar surgery x 2 done in [place]  and [place]   COLON RESECTION SIGMOID  08/16/2022   Procedure: COLON RESECTION SIGMOID;  Surgeon: Viktoria Comer SAUNDERS, MD;  Location: [facility];  Service: Gynecology;;   COLONOSCOPY  07/2010   tubular adenoma   COLONOSCOPY  08/2015   CYSTOSCOPY N/A 08/16/2022   Procedure: CYSTOSCOPY;  Surgeon: Viktoria Comer SAUNDERS, MD;  Location: [facility];  Service: Gynecology;  Laterality: N/A;   DISKECTOMY     ESOPHAGOGASTRODUODENOSCOPY N/A 03/30/2024   Procedure: EGD (ESOPHAGOGASTRODUODENOSCOPY);  Surgeon: Maryruth Ole DASEN, MD;  Location: [facility];  Service: Endoscopy;  Laterality: N/A;   HYSTERECTOMY ABDOMINAL WITH SALPINGO-OOPHORECTOMY  08/16/2022   Procedure: HYSTERECTOMY ABDOMINAL WITH SALPINGO-OOPHORECTOMY;  Surgeon: Viktoria Comer SAUNDERS, MD;  Location: [facility];  Service: Gynecology;;   IR IMAGING GUIDED PORT INSERTION  05/18/2022   IR IMAGING GUIDED PORT INSERTION  07/03/2023   IR REMOVAL TUN ACCESS W/ PORT W/O FL MOD SED  12/20/2022   IR US  GUIDE BX ASP/DRAIN  05/18/2022   LAPAROSCOPY N/A 08/16/2022   Procedure: LAPAROSCOPY DIAGNOSTIC;  Surgeon: Viktoria Comer SAUNDERS, MD;  Location: [facility];  Service: Gynecology;  Laterality: N/A;   laparoscopy for endometriosis     left shoulder scope     RADIOLOGY WITH ANESTHESIA N/A 05/18/2022   Procedure: IR WITH ANESTHESIA PORT AND BIOPSY;  Surgeon: Luverne Aran, MD;  Location: [facility];  Service: Radiology;  Laterality: N/A;   ROTATOR CUFF REPAIR Left    Spinal Fusion  12/2016   L4-S1    SOCIAL HISTORY: Social History   Socioeconomic History   Marital status: Single    Spouse name: Not on file   Number of children: 1   Years of education: Not on file   Highest education level: Not on file  Occupational History   Not on file  Tobacco Use   Smoking status: Some Days    Current packs/day: 0.00     Average packs/day: 0.1 packs/day for 27.0 years (2.7 ttl pk-yrs)    Types: Cigarettes    Start date: 86    Last attempt to quit: 2023    Years since quitting: 3.0   Smokeless tobacco: Never  Vaping Use   Vaping status: Never  Used  Substance and Sexual Activity   Alcohol use: Not Currently   Drug use: Yes    Types: Oxycodone    Sexual activity: Not Currently  Other Topics Concern   Not on file  Social History Narrative   Lives alone   Right handed   Caffeine: none    Social Drivers of Health   Tobacco Use: High Risk (05/16/2024)   Patient History    Smoking Tobacco Use: Some Days    Smokeless Tobacco Use: Never    Passive Exposure: Not on file  Financial Resource Strain: Not on file  Food Insecurity: No Food Insecurity (04/24/2024)   Epic    Worried About Programme Researcher, Broadcasting/film/video in the Last Year: Never true    Ran Out of Food in the Last Year: Never true  Transportation Needs: No Transportation Needs (04/24/2024)   Epic    Lack of Transportation (Medical): No    Lack of Transportation (Non-Medical): No  Physical Activity: Not on file  Stress: Not on file  Social Connections: Socially Isolated (04/24/2024)   Social Connection and Isolation Panel    Frequency of Communication with Friends and Family: Three times a week    Frequency of Social Gatherings with Friends and Family: Never    Attends Religious Services: Never    Database Administrator or Organizations: No    Attends Banker Meetings: Never    Marital Status: Divorced  Catering Manager Violence: Not At Risk (04/24/2024)   Epic    Fear of Current or Ex-Partner: No    Emotionally Abused: No    Physically Abused: No    Sexually Abused: No  Depression (PHQ2-9): Not on file  Alcohol Screen: Not on file  Housing: Unknown (04/24/2024)   Epic    Unable to Pay for Housing in the Last Year: No    Number of Times Moved in the Last Year: 0    Homeless in the Last Year: Not on file  Utilities: Not At Risk  (04/24/2024)   Epic    Threatened with loss of utilities: No  Health Literacy: Not on file    FAMILY HISTORY: Family History  Problem Relation Age of Onset   Heart disease Mother        Died with MI 10, chest pain 30s   Hypertension Mother    Pancreatitis Father    Atrial fibrillation Sister    Breast cancer Maternal Aunt        dx <50   Stomach cancer Maternal Grandfather        mets to liver? dx after 50   Multiple sclerosis Cousin    Breast cancer Cousin        mat female cousin; dx unknown age   Colon cancer Cousin 7       mat female cousin; mets   Leukemia Cousin 67       mat female cousin    ALLERGIES:  is allergic to cymbalta [duloxetine hcl], cyclobenzaprine hcl, celexa  [citalopram hydrobromide], fluticasone  furoate-vilanterol, gabapentin, opana  [oxymorphone hcl], and tramadol.  MEDICATIONS:  Current Outpatient Medications  Medication Sig Dispense Refill   albuterol  (VENTOLIN  HFA) 108 (90 Base) MCG/ACT inhaler Inhale 2 puffs into the lungs every 6 (six) hours as needed for shortness of breath.     amphetamine -dextroamphetamine  (ADDERALL ) 20 MG tablet Take 10-20 mg by mouth See admin instructions. Take 20 mg by mouth in the morning and 10 mg between 1-2 PM daily  Ascorbic Acid (VITAMIN C) 1000 MG tablet Take 1,000 mg by mouth daily.     ASHWAGANDHA PO Take 1 capsule by mouth daily.     baclofen  (LIORESAL ) 10 MG tablet Take 10 mg by mouth 3 (three) times daily as needed.     BLACK CURRANT SEED OIL PO Take 1 capsule by mouth daily.     dexamethasone  (DECADRON ) 4 MG tablet Take 1 tablet (4 mg total) by mouth daily. 30 tablet 6   hydroxychloroquine (PLAQUENIL) 200 MG tablet Take 200 mg by mouth 2 (two) times daily.     lidocaine -prilocaine  (EMLA ) cream Apply to affected area once 30 g 3   LORazepam  (ATIVAN ) 0.5 MG tablet Take 0.5 mg by mouth 3 (three) times daily as needed for anxiety.     mirtazapine  (REMERON ) 7.5 MG tablet Take 7.5 mg by mouth at bedtime.      Multiple Vitamin (MULTIVITAMIN WITH MINERALS) TABS tablet Take 1 tablet by mouth daily.     naloxone  (NARCAN ) nasal spray 4 mg/0.1 mL Place 1 spray into the nose as needed (opioid overdose).     omeprazole  (PRILOSEC) 20 MG capsule TAKE 1 CAPSULE BY MOUTH EVERY DAY 90 capsule 1   ondansetron  (ZOFRAN ) 8 MG tablet Take 1 tablet (8 mg total) by mouth every 8 (eight) hours as needed for nausea or vomiting. 30 tablet 1   OVER THE COUNTER MEDICATION Take 2 capsules by mouth daily. [place] advanced supplement     oxyCODONE  ER (XTAMPZA  ER) 13.5 MG C12A Take 13.5 mg by mouth in the morning, at noon, and at bedtime.     Oxycodone  HCl 20 MG TABS Take 20 mg by mouth 5 (five) times daily as needed (pain).     polyethylene glycol (MIRALAX  / GLYCOLAX ) 17 g packet Take 17 g by mouth daily.     prochlorperazine  (COMPAZINE ) 10 MG tablet Take 1 tablet (10 mg total) by mouth every 6 (six) hours as needed for nausea or vomiting. 30 tablet 1   Vitamin D, Ergocalciferol, (DRISDOL) 1.25 MG (50000 UNIT) CAPS capsule Take 50,000 Units by mouth every Sunday.     WIXELA INHUB 250-50 MCG/ACT AEPB Inhale 1 puff into the lungs daily.     No current facility-administered medications for this visit.    Review of Systems  Constitutional:  Positive for fatigue. Negative for appetite change, chills and fever.  HENT:   Negative for hearing loss and voice change.   Eyes:  Negative for eye problems.  Respiratory:  Negative for chest tightness and cough.   Cardiovascular:  Negative for chest pain.  Gastrointestinal:  Negative for abdominal distention, abdominal pain and blood in stool.       Dark tarry stool  Endocrine: Negative for hot flashes.  Genitourinary:  Negative for difficulty urinating and frequency.   Musculoskeletal:  Negative for arthralgias.  Skin:  Negative for itching and rash.  Neurological:  Negative for extremity weakness.  Hematological:  Negative for adenopathy.  Psychiatric/Behavioral:  Negative for  confusion.      PHYSICAL EXAMINATION: ECOG PERFORMANCE STATUS: 1 - Symptomatic but completely ambulatory  Vitals:   05/16/24 1139 05/16/24 1150  BP: (!) 151/87 (!) 141/83  Pulse: 87   Resp: 18   Temp: 97.6 F (36.4 C)   SpO2: 100%    Filed Weights   05/16/24 1139  Weight: 154 lb 4.8 oz (70 kg)    Physical Exam Constitutional:      General: She is not in acute distress.  Appearance: She is not diaphoretic.     Comments: Patient ambulates independently  HENT:     Head: Normocephalic and atraumatic.  Eyes:     General: No scleral icterus. Cardiovascular:     Rate and Rhythm: Normal rate and regular rhythm.  Pulmonary:     Effort: Pulmonary effort is normal. No respiratory distress.     Breath sounds: No wheezing.  Abdominal:     General: Bowel sounds are normal. There is no distension.     Palpations: Abdomen is soft.  Musculoskeletal:        General: Normal range of motion.     Cervical back: Normal range of motion and neck supple.  Skin:    General: Skin is warm and dry.     Findings: No erythema.  Neurological:     Mental Status: She is alert and oriented to person, place, and time. Mental status is at baseline.     Motor: No abnormal muscle tone.  Psychiatric:        Mood and Affect: Mood and affect normal.      LABORATORY DATA:  I have reviewed the data as listed    Latest Ref Rng & Units 05/15/2024    8:54 AM 05/09/2024   12:27 PM 04/25/2024    6:11 AM  CBC  WBC 4.0 - 10.5 K/uL 7.5  7.0  10.7   Hemoglobin 12.0 - 15.0 g/dL 8.3  6.9  7.5   Hematocrit 36.0 - 46.0 % 26.6  22.7  23.3   Platelets 150 - 400 K/uL 362  498  295       Latest Ref Rng & Units 05/15/2024    8:54 AM 05/09/2024   12:27 PM 04/25/2024    6:11 AM  CMP  Glucose 70 - 99 mg/dL 99  871  874   BUN 8 - 23 mg/dL 17  12  8    Creatinine 0.44 - 1.00 mg/dL 9.25  9.18  9.24   Sodium 135 - 145 mmol/L 136  136  135   Potassium 3.5 - 5.1 mmol/L 3.7  4.1  3.7   Chloride 98 - 111 mmol/L 101  98   101   CO2 22 - 32 mmol/L 24  24  25    Calcium 8.9 - 10.3 mg/dL 8.9  9.6  8.0   Total Protein 6.5 - 8.1 g/dL 6.9     Total Bilirubin 0.0 - 1.2 mg/dL <9.7     Alkaline Phos 38 - 126 U/L 77     AST 15 - 41 U/L 23     ALT 0 - 44 U/L 15        RADIOGRAPHIC STUDIES: I have personally reviewed the radiological images as listed and agreed with the findings in the report. DG Chest 2 View Result Date: 05/09/2024 CLINICAL DATA:  Chest pain EXAM: CHEST - 2 VIEW COMPARISON:  March 29, 2024 FINDINGS: The heart size and mediastinal contours are within normal limits. Stable right internal jugular Port-A-Cath. Both lungs are clear. The visualized skeletal structures are unremarkable. IMPRESSION: No active cardiopulmonary disease. Electronically Signed   By: Lynwood Landy Raddle M.D.   On: 05/09/2024 13:59    "

## 2024-05-17 NOTE — Assessment & Plan Note (Addendum)
 Lab Results  Component Value Date   HGB 8.3 (L) 05/15/2024   TIBC 344 05/15/2024   IRONPCTSAT 7 (L) 05/15/2024   FERRITIN 79 05/15/2024      Recommend IV Venofer  weekly x 3. I I discussed about the potential risks including but not limited to allergic reactions/infusion reactions including anaphylactic reactions, diarrhea, phlebitis, high blood pressure, wheezing, SOB, skin rash, weight gain,dark urine, leg swelling, back pain, headache, nausea and fatigue, etc. Patient agree with the plan  She will also get blood work monitored weekly, plan blood transfusion and supportive care if needed.

## 2024-05-17 NOTE — Assessment & Plan Note (Addendum)
 Recurrent ovarian cancer, High grade serous, p53 mutated, Neg genetics, HRD not detected, MSI stable, low TMB 2 previously progressed after prior lines of chemotherapy-carboplatin /gemcitabine /bevacizumab . Previous chemotherapy was complicated by cytopenia, peripheral neuropathy, tooth infection  Patient desires further cancer treatments.  She understands that potential side effects of chemotherapy treatments.  Patient understands that her condition is not curable and the prognosis is poor. Check folate receptor alpha IHC status. Bevacizumab  is contraindicated due to her bleeding/obstruction risk.  Consider doxorubicin if FR alpha negative.  I recommend patient to establish care with gynecology oncology  Obtain echo or MUGA study for evaluation of baseline heart function.

## 2024-05-19 ENCOUNTER — Other Ambulatory Visit: Payer: Self-pay

## 2024-05-19 ENCOUNTER — Inpatient Hospital Stay: Admitting: Hematology and Oncology

## 2024-05-19 ENCOUNTER — Inpatient Hospital Stay

## 2024-05-19 ENCOUNTER — Telehealth: Payer: Self-pay

## 2024-05-19 ENCOUNTER — Telehealth: Payer: Self-pay | Admitting: Oncology

## 2024-05-19 DIAGNOSIS — K3189 Other diseases of stomach and duodenum: Secondary | ICD-10-CM

## 2024-05-19 DIAGNOSIS — C563 Malignant neoplasm of bilateral ovaries: Secondary | ICD-10-CM

## 2024-05-19 DIAGNOSIS — K921 Melena: Secondary | ICD-10-CM

## 2024-05-19 NOTE — Progress Notes (Signed)
 FOLR1 IHC requested on specimen [id number], duodenal mass, collected 03/30/2024. ECHO requested per Dr. Babara for chemotherapy clearance. Referral placed to IR to evaluate for embolization of duodenal mass.

## 2024-05-19 NOTE — Telephone Encounter (Signed)
 Per inbasket from Navigator, confirm with pt the ECHO appt.   I called and lvm for pt with the ECHO appt details and the scheduling number for the ECHO dept. If she needed to r/s appt for any reason.

## 2024-05-19 NOTE — Consult Note (Shared)
 "      Chief Complaint: Patient was seen in consultation today for duodenal mass with possible embolization at the request of Yu,Zhou  Referring Physician(s): Yu,Zhou  Consulting Physician: Hughes Simmonds  History of Present Illness: [name] is a 69 y.o. female with a history of recurrent bilateral ovarian cancer that was initially diagnosed on CT A/P on 04/11/22. She was following with Dr. Viktoria in GYN-ONC and underwent total hysterectomy with bilateral salpingo-oophorectomy, appendectomy, rectosigmoid resection with re-anastomosis, and total omentectomy. This was followed by chemotherapy through 12/2022 and surveillance. She was unfortunately noted to have new metastatic disease on imaging in 06/2023. She is known to IR from previous port-placements in 2024 and 2025.  In November 2025, she underwent hospitalization for symptomatic anemia and upper GI bleed secondary to a necrotic, bleeding duodenal mass. CTA at that time revealed:   Large necrotic 7.8 x 8.7 x 8.1cm mass interposed between the head of the pancreas and duodenal ball. This mass displaces and likely invades the wall of the distal stomach and proximal duodenum and may be the source for chronic blood loss; however, on today's exam, there is no evidence of contrast accumulation within the bowel lumen to suggest active hemorrhage.   She was discharged home, but unfortunately returned to the hospital in late December with similar concerns. Following this, she had plans to discontinue treatments and pursue hospice care; however, patient wishes to continue receiving blood transfusions and recently expressed interest in additional interventions to improve her bleeding leading to referral to IR.  She was scheduled for initial evaluation on 1/13, but presented to her Oncologist instead due to weakness and concerns for needing a blood transfusion. H/H that day stable at 8.5/27.1 following previous transfusion on 05/12/24 and she was referred  to Dietary to discuss strategies to increase PO intake.   She presents today with *** to discuss possible embolization for ongoing bleeding from ulcerating duodenal mass. Currently admits to *** Most recent labs significant for H/H of 7.6/23.9, CA 125 of 60.1, Ferritin of 79, and iron  of 24.   Past Medical History:  Diagnosis Date   Anxiety    Asthma    Back problem    disc disease, chronic low back pain, right leg pain, s/p discectomy 99   Cancer (HCC)    ovarian cancer   Depression    Diastolic dysfunction    with elevated LVEDP at cath   Endometriosis    Fibroids    hx of    GERD (gastroesophageal reflux disease)    Heart murmur    Hepatitis C    treated 8-9 years ago   Hidradenitis suppurativa    History of kidney stones    Hypertension    MS (multiple sclerosis)    Neuromuscular disorder (HCC)    Mulitple sclerosis   Osteoarthritis    Pre-diabetes    Prediabetes    RA (rheumatoid arthritis) (HCC)    Rotator cuff tear    right shoulder    Syncope     Past Surgical History:  Procedure Laterality Date   ANGIOPLASTY     BACK SURGERY     lumbar surgery x 2 done in [place]  and [place]   COLON RESECTION SIGMOID  08/16/2022   Procedure: COLON RESECTION SIGMOID;  Surgeon: Viktoria Comer SAUNDERS, MD;  Location: [facility];  Service: Gynecology;;   COLONOSCOPY  07/2010   tubular adenoma   COLONOSCOPY  08/2015   CYSTOSCOPY N/A 08/16/2022   Procedure: CYSTOSCOPY;  Surgeon: Viktoria Comer SAUNDERS, MD;  Location: [facility];  Service: Gynecology;  Laterality: N/A;   DISKECTOMY     ESOPHAGOGASTRODUODENOSCOPY N/A 03/30/2024   Procedure: EGD (ESOPHAGOGASTRODUODENOSCOPY);  Surgeon: Maryruth Ole DASEN, MD;  Location[facility];  Service: Endoscopy;  Laterality: N/A;   HYSTERECTOMY ABDOMINAL WITH SALPINGO-OOPHORECTOMY  08/16/2022   Procedure: HYSTERECTOMY ABDOMINAL WITH SALPINGO-OOPHORECTOMY;  Surgeon: Viktoria Comer SAUNDERS, MD;  Location: [facility];  Service: Gynecology;;   IR IMAGING  GUIDED PORT INSERTION  05/18/2022   IR IMAGING GUIDED PORT INSERTION  07/03/2023   IR REMOVAL TUN ACCESS W/ PORT W/O FL MOD SED  12/20/2022   IR US  GUIDE BX ASP/DRAIN  05/18/2022   LAPAROSCOPY N/A 08/16/2022   Procedure: LAPAROSCOPY DIAGNOSTIC;  Surgeon: Viktoria Comer SAUNDERS, MD;  Location: [facility];  Service: Gynecology;  Laterality: N/A;   laparoscopy for endometriosis     left shoulder scope     RADIOLOGY WITH ANESTHESIA N/A 05/18/2022   Procedure: IR WITH ANESTHESIA PORT AND BIOPSY;  Surgeon: Luverne Aran, MD;  Location: [facility];  Service: Radiology;  Laterality: N/A;   ROTATOR CUFF REPAIR Left    Spinal Fusion  12/2016   L4-S1    Allergies: Cymbalta [duloxetine hcl], Cyclobenzaprine hcl, Celexa  [citalopram hydrobromide], Fluticasone  furoate-vilanterol, Gabapentin, Opana  [oxymorphone hcl], and Tramadol  Medications: Prior to Admission medications  Medication Sig Start Date End Date Taking? Authorizing Provider  albuterol  (VENTOLIN  HFA) 108 (90 Base) MCG/ACT inhaler Inhale 2 puffs into the lungs every 6 (six) hours as needed for shortness of breath. 07/30/21   [provider]  amphetamine -dextroamphetamine  (ADDERALL ) 20 MG tablet Take 10-20 mg by mouth See admin instructions. Take 20 mg by mouth in the morning and 10 mg between 1-2 PM daily    [provider]  Ascorbic Acid (VITAMIN C) 1000 MG tablet Take 1,000 mg by mouth daily.    [provider]  ASHWAGANDHA PO Take 1 capsule by mouth daily.    [provider]  baclofen  (LIORESAL ) 10 MG tablet Take 10 mg by mouth 3 (three) times daily as needed.    [provider]  BLACK CURRANT SEED OIL PO Take 1 capsule by mouth daily.    [provider]  dexamethasone  (DECADRON ) 4 MG tablet Take 1 tablet (4 mg total) by mouth daily. 04/18/24   Lonn Hicks, MD  hydroxychloroquine (PLAQUENIL) 200 MG tablet Take 200 mg by mouth 2 (two) times daily. 01/23/24   [provider]   lidocaine -prilocaine  (EMLA ) cream Apply to affected area once 06/25/23   Lonn Hicks, MD  LORazepam  (ATIVAN ) 0.5 MG tablet Take 0.5 mg by mouth 3 (three) times daily as needed for anxiety.    [provider]  mirtazapine  (REMERON ) 7.5 MG tablet Take 7.5 mg by mouth at bedtime. 04/22/24   [provider]  Multiple Vitamin (MULTIVITAMIN WITH MINERALS) TABS tablet Take 1 tablet by mouth daily.    [provider]  naloxone  (NARCAN ) nasal spray 4 mg/0.1 mL Place 1 spray into the nose as needed (opioid overdose).    [provider]  omeprazole  (PRILOSEC) 20 MG capsule TAKE 1 CAPSULE BY MOUTH EVERY DAY 03/24/24   Lonn Hicks, MD  ondansetron  (ZOFRAN ) 8 MG tablet Take 1 tablet (8 mg total) by mouth every 8 (eight) hours as needed for nausea or vomiting. 02/29/24   Lonn Hicks, MD  OVER THE COUNTER MEDICATION Take 2 capsules by mouth daily. [place] advanced supplement    [provider]  oxyCODONE  ER (XTAMPZA  ER) 13.5 MG C12A Take 13.5 mg by mouth in the morning, at noon, and at bedtime.    [provider]  Oxycodone  HCl 20 MG TABS Take 20 mg by mouth 5 (five) times daily as needed (pain).    [provider]  polyethylene glycol (MIRALAX  / GLYCOLAX ) 17 g packet Take 17 g by mouth daily.    [provider]  prochlorperazine  (COMPAZINE ) 10 MG tablet Take 1 tablet (10 mg total) by mouth every 6 (six) hours as needed for nausea or vomiting. 02/29/24   Lonn Hicks, MD  Vitamin D, Ergocalciferol, (DRISDOL) 1.25 MG (50000 UNIT) CAPS capsule Take 50,000 Units by mouth every Sunday.    [provider]  NAPOLEON INHUB 250-50 MCG/ACT AEPB Inhale 1 puff into the lungs daily.    [provider]     Family History  Problem Relation Age of Onset   Heart disease Mother        Died with MI 47, chest pain 30s   Hypertension Mother    Pancreatitis Father    Atrial fibrillation Sister    Breast cancer Maternal Aunt        dx <50    Stomach cancer Maternal Grandfather        mets to liver? dx after 50   Multiple sclerosis Cousin    Breast cancer Cousin        mat female cousin; dx unknown age   Colon cancer Cousin 54       mat female cousin; mets   Leukemia Cousin 78       mat female cousin    Social History   Socioeconomic History   Marital status: Single    Spouse name: Not on file   Number of children: 1   Years of education: Not on file   Highest education level: Not on file  Occupational History   Not on file  Tobacco Use   Smoking status: Some Days    Current packs/day: 0.00    Average packs/day: 0.1 packs/day for 27.0 years (2.7 ttl pk-yrs)    Types: Cigarettes    Start date: 4    Last attempt to quit: 2023    Years since quitting: 3.0   Smokeless tobacco: Never  Vaping Use   Vaping status: Never Used  Substance and Sexual Activity   Alcohol use: Not Currently   Drug use: Yes    Types: Oxycodone    Sexual activity: Not Currently  Other Topics Concern   Not on file  Social History Narrative   Lives alone   Right handed   Caffeine: none    Social Drivers of Health   Tobacco Use: High Risk (05/16/2024)   Patient History    Smoking Tobacco Use: Some Days    Smokeless Tobacco Use: Never    Passive Exposure: Not on file  Financial Resource Strain: Not on file  Food Insecurity: No Food Insecurity (04/24/2024)   Epic    Worried About Programme Researcher, Broadcasting/film/video in the Last Year: Never true    Ran Out of Food in the Last Year: Never true  Transportation Needs: No Transportation Needs (04/24/2024)   Epic    Lack of Transportation (Medical): No    Lack of Transportation (Non-Medical): No  Physical Activity: Not on file  Stress: Not on file  Social Connections: Socially Isolated (04/24/2024)   Social Connection and Isolation Panel    Frequency of Communication with Friends and Family: Three times  a week    Frequency of Social Gatherings with Friends and Family: Never    Attends Religious  Services: Never    Database Administrator or Organizations: No    Attends Banker Meetings: Never    Marital Status: Divorced  Depression (PHQ2-9): Not on file  Alcohol Screen: Not on file  Housing: Unknown (04/24/2024)   Epic    Unable to Pay for Housing in the Last Year: No    Number of Times Moved in the Last Year: 0    Homeless in the Last Year: Not on file  Utilities: Not At Risk (04/24/2024)   Epic    Threatened with loss of utilities: No  Health Literacy: Not on file    Review of Systems  Vital Signs: There were no vitals taken for this visit.   Physical Exam   Imaging: DG Chest 2 View Result Date: 05/09/2024 CLINICAL DATA:  Chest pain EXAM: CHEST - 2 VIEW COMPARISON:  March 29, 2024 FINDINGS: The heart size and mediastinal contours are within normal limits. Stable right internal jugular Port-A-Cath. Both lungs are clear. The visualized skeletal structures are unremarkable. IMPRESSION: No active cardiopulmonary disease. Electronically Signed   By: Lynwood Landy Raddle M.D.   On: 05/09/2024 13:59    Labs:  CBC: Recent Labs    04/24/24 1323 04/25/24 0611 05/09/24 1227 05/15/24 0854  WBC 10.7* 10.7* 7.0 7.5  HGB 7.2* 7.5* 6.9* 8.3*  HCT 22.9* 23.3* 22.7* 26.6*  PLT 339 295 498* 362    COAGS: No results for input(s): INR, APTT in the last 8760 hours.  BMP: Recent Labs    04/24/24 1323 04/25/24 0611 05/09/24 1227 05/15/24 0854  NA 131* 135 136 136  K 3.8 3.7 4.1 3.7  CL 96* 101 98 101  CO2 22 25 24 24   GLUCOSE 140* 125* 128* 99  BUN 11 8 12 17   CALCIUM 8.4* 8.0* 9.6 8.9  CREATININE 0.74 0.75 0.81 0.74  GFRNONAA >60 >60 >60 >60    LIVER FUNCTION TESTS: Recent Labs    03/30/24 0722 04/18/24 1059 04/24/24 1323 05/15/24 0854  BILITOT 0.3 0.4 0.3 <0.2  AST 23 18 18 23   ALT 15 12 10 15   ALKPHOS 50 74 56 77  PROT 5.8* 6.8 6.5 6.9  ALBUMIN  3.6 3.8 3.1* 3.6    TUMOR MARKERS: No results for input(s): AFPTM, CEA, CA199,  CHROMGRNA in the last 8760 hours.  Assessment and Plan:  69 year old female with a history of recurrent metastatic ovarian cancer s/p TAH-BSO, appendectomy, rectosigmoid resection, total omentectomy, and chemotherapy with recurrent bleeding and symptomatic anemia secondary to ulcerating duodenal mass. Referred to IR to discuss embolization for bleeding.   -Patient reports *** -Based on imaging and persistent symptoms, patient would likely benefit from embolization of *** -Recommend *** -Please schedule ***  Thorough discussion of procedure, as well as, potential risks and benefits of embolization were discussed with the patient including, but not limited to bleeding, infection, vascular injury, post operative pain, or contrast induced renal failure.  Thank you for this interesting consult.  I greatly enjoyed meeting RYLYN ZAWISTOWSKI and look forward to participating in their care.  A copy of this report was sent to the requesting provider on this date.  Electronically Signed: Glennon CHRISTELLA Bal 05/19/2024, 8:39 PM   I spent a total of {New Out-Pt:[id number]} in face to face in clinical consultation, greater than 50% of which was counseling/coordinating care for embolization.  "

## 2024-05-19 NOTE — Telephone Encounter (Signed)
-----   Message from Zelphia Cap, MD sent at 05/17/2024  1:04 PM EST ----- I saw this patient and she expressed desire of cancer treatment.  Please arrange her to get ECHO for baseline evaluation.  Also please ask pathology to add Folate receptor alpha IHC staining.  Please arrange her to see Gynonc for evaluation. I plan doxorubicin if FR alpha Is negative.  Thanks.   Zelphia

## 2024-05-19 NOTE — Telephone Encounter (Signed)
 [facility] has taken care of referral, imaging and testing addon.

## 2024-05-20 ENCOUNTER — Inpatient Hospital Stay
Admission: RE | Admit: 2024-05-20 | Discharge: 2024-05-20 | Disposition: A | Discharge status: Home / Self Care | Source: Ambulatory Visit | Attending: Oncology | Admitting: Oncology

## 2024-05-20 ENCOUNTER — Telehealth: Payer: Self-pay | Admitting: *Deleted

## 2024-05-20 ENCOUNTER — Encounter: Payer: Self-pay | Admitting: Oncology

## 2024-05-20 ENCOUNTER — Inpatient Hospital Stay: Admitting: Nurse Practitioner

## 2024-05-20 ENCOUNTER — Inpatient Hospital Stay

## 2024-05-20 VITALS — BP 108/73 | HR 100 | Temp 99.2°F | Resp 24 | Wt 148.2 lb

## 2024-05-20 DIAGNOSIS — C563 Malignant neoplasm of bilateral ovaries: Secondary | ICD-10-CM

## 2024-05-20 DIAGNOSIS — D649 Anemia, unspecified: Secondary | ICD-10-CM | POA: Diagnosis not present

## 2024-05-20 DIAGNOSIS — G893 Neoplasm related pain (acute) (chronic): Secondary | ICD-10-CM | POA: Diagnosis not present

## 2024-05-20 DIAGNOSIS — D500 Iron deficiency anemia secondary to blood loss (chronic): Secondary | ICD-10-CM | POA: Diagnosis not present

## 2024-05-20 LAB — CMP (CANCER CENTER ONLY)
ALT: 10 U/L (ref 0–44)
AST: 21 U/L (ref 15–41)
Albumin: 3.3 g/dL — ABNORMAL LOW (ref 3.5–5.0)
Alkaline Phosphatase: 80 U/L (ref 38–126)
Anion gap: 11 (ref 5–15)
BUN: 13 mg/dL (ref 8–23)
CO2: 25 mmol/L (ref 22–32)
Calcium: 9 mg/dL (ref 8.9–10.3)
Chloride: 96 mmol/L — ABNORMAL LOW (ref 98–111)
Creatinine: 0.84 mg/dL (ref 0.44–1.00)
GFR, Estimated: >60 mL/min
Glucose, Bld: 127 mg/dL — ABNORMAL HIGH (ref 70–99)
Potassium: 4 mmol/L (ref 3.5–5.1)
Sodium: 132 mmol/L — ABNORMAL LOW (ref 135–145)
Total Bilirubin: 0.3 mg/dL (ref 0.0–1.2)
Total Protein: 6.6 g/dL (ref 6.5–8.1)

## 2024-05-20 LAB — CBC WITH DIFFERENTIAL (CANCER CENTER ONLY)
Abs Immature Granulocytes: 0.04 K/uL (ref 0.00–0.07)
Basophils Absolute: 0 K/uL (ref 0.0–0.1)
Basophils Relative: 0 %
Eosinophils Absolute: 0 K/uL (ref 0.0–0.5)
Eosinophils Relative: 0 %
HCT: 27.1 % — ABNORMAL LOW (ref 36.0–46.0)
Hemoglobin: 8.5 g/dL — ABNORMAL LOW (ref 12.0–15.0)
Immature Granulocytes: 1 %
Lymphocytes Relative: 20 %
Lymphs Abs: 1.4 K/uL (ref 0.7–4.0)
MCH: 27.6 pg (ref 26.0–34.0)
MCHC: 31.4 g/dL (ref 30.0–36.0)
MCV: 88 fL (ref 80.0–100.0)
Monocytes Absolute: 1 K/uL (ref 0.1–1.0)
Monocytes Relative: 13 %
Neutro Abs: 4.8 K/uL (ref 1.7–7.7)
Neutrophils Relative %: 66 %
Platelet Count: 409 K/uL — ABNORMAL HIGH (ref 150–400)
RBC: 3.08 MIL/uL — ABNORMAL LOW (ref 3.87–5.11)
RDW: 16.7 % — ABNORMAL HIGH (ref 11.5–15.5)
WBC Count: 7.3 K/uL (ref 4.0–10.5)
nRBC: 0 % (ref 0.0–0.2)

## 2024-05-20 LAB — SAMPLE TO BLOOD BANK

## 2024-05-20 NOTE — Progress Notes (Signed)
 Feels really weak.  States when I feel like this  I usually need a blood transfusion.

## 2024-05-20 NOTE — Telephone Encounter (Signed)
 Caller verified using pt's full name and dob prior to discussing PHI    Patient requesting apt as soon as possible for lab check and see provider. She feels very weak and is requesting a blood transfusion. Pt has an apt on Thursday 05/22/24, but is requesting an apt this afternoon. Pt's last hgb 5 days ago was 8.3     NURSING SYMPTOM TRIAGE ASSESSMENT & DISPOSITION  Mode of interaction:  Telephone Date of symptom triage interaction:  05/20/2024 Time of symptom triage interaction:  1242  Cancer diagnosis:  Malignant neoplasm of both ovaries (HCC)   Symptom Triage Protocol:  Fatigue  History of the problem:  Fatigue  Severity: 0-10 scale with 0 as not severe and 10 as unimaginably severe 10 = Worst imaginable  Onset: Patient states that she receives frequent blood transfusion periodically to maintain her blood counts (palliative). H/o GI bleed in December 2025. Ongoing fatigue.   Duration:   Relieving factors: rest  Intensifying factors: Ambulation and moving  Changes in ADLs: Patient reports the weakness causes her to move slower to perform her ADLs  Associated symptoms: Muscle weakness     Additional commentary: Patient took her vitals at rest 97/56 with HR of 103- prior to the phone call.     Triage Nurse Guidance  Signs and Symptoms Action  Unable to wake up Seek emergency care. Call an ambulance immediately.  Severe fatigue that is disabling; patient is bedridden.   Temperature higher than 100.940F (38C) with suspected neutropenia  Adverse reaction to psychostimulant (e.g., methylpheni-date, modafinil, prednisone , dexamethasone ) Seek emergency care.   Severe fatigue or loss of ability to perform some activities Dizziness Temperature higher than 100.940F (38C) without sus-pected neutropenia Schedule office visit in 24-48 hours.   Moderate fatigue or difficulty performing some activities of daily living Follow homecare instruc-tions. Notify MD if no improvement.   Increased fatigue over baseline but not altering daily life-style Follow homecare instruc-tions. Notify MD if no improvement.     Patient Instruction  Disclaimer:  Patient specific and [employer] instruction provided verbally and sent through MyChart for active users.    Report the Following Problems: Blood in urine or stool Weight loss Fever (temperature above the normal or baseline temperature) without neutropenia Inability to perform activities of daily living Inability to conceptualize thoughts  Seek Emergency Care Immediately if Any of the Following Occurs: Fainting Unconsciousness Temperature higher than 100.940F (38C) with suspected neutropenia  Participate in the appropriate and optimal physical activity/exercise for the individual: Start slowly (e.g., walking, gentle stretching, yoga). Try short periods of exercise with frequent rest breaks. Avoid uneven surfaces. Avoid activity that puts you at risk for falls or injury. Aim for daily physical activity.  If able, include endurance activities (e.g., walking, swimming, cycling) and resis-tance training (e.g., weights, resistance bands).  Seek a physical therapist or exercise specialist for assessment and an exercise prescription.  Exercise is recommended with caution in patients with the following:  Bone metastases Thrombocytopenia Anemia Fever or active infection Physical limitations secondary to metastases or comorbid conditions Safety issues (risk of falls)  Manage stress through various means: In-person or online support groups Expressive writing, journaling Supportive counseling Relaxation breathing Progressive muscle relaxation Mindfulness-based stress reduction Meditation  Practice energy conservation: Prioritize important tasks and eliminate or delegate others. Keep frequently used items within reach.  Do not stand too long. Sit when preparing meals, washing dishes, ironing, etc.  Alternate periods  of activity and rest throughout the day.  Plan ahead, as rushing  uses energy.  Keep an activity journal for a few weeks to identify patterns of energy and fatigue. Use adaptive devices such as a jar opener, reaching or grabbing tool, shower chair, or bedside commode.  Optimize sleep quality: Cognitive behavioral therapy can address negative thought patterns and behav-iors that interfere with sleep. Sleep restriction: Match the time in bed with sleep requirements.  Avoid caffeine at least six to eight hours before bedtime.  Stop smoking.  Avoid alcohol and high-sugar foods in the evening.  Use the bedroom only for sleep and sex.  Limit daytime naps to 30 minutes.  Establish a routine prior to sleep that promotes relaxation.  Create a conducive sleep environment (dark, quiet, and comfortable).  Avoid gaming, watching TV, and using a computer or cell phone late at night.  Encourage a balanced diet and adequate intake of fluids, electrolytes, calories, protein, carbohydrates, fat, vitamins, and minerals.  Teach Back Method used:  Yes     Provider Consulted  Provider name and credentials: Msg sent to [facility] providers and Dr. Babara  Provider instruction: Per Dr. Babara- lab/ [facility]    Nurse Triage Priority:  Urgent (obtain medical orders as indicated with instruction for 8-24 hour or sooner follow-up)   Barriers to Care:  None identified other than patient lives by herself but relies on her sister for transportation   Nurse Triage Disposition:  In-person follow-up visit:  lab/smc today.   Protocol Source:  Ruthine HERO., & Eldonna CANDIE Pee.). (2019). Telephone triage for oncology nurses (3rd ed.). Oncology Nursing Society.

## 2024-05-20 NOTE — Progress Notes (Signed)
 "  [facility]  [facility] A Department of the [facility]. [facility] [address] [place], [place] [address] [phone] (phone) [phone] (fax)  Patient Care Team: Arloa Elsie SAUNDERS, MD as PCP - General (Family Medicine) Martinez, Sarah A, PA-C (Physician Assistant) Maurie Rayfield BIRCH, RN as Oncology Nurse Navigator Babara Call, MD as Consulting Physician (Oncology)   Name of the patient: [name]  [id number]  02-Jan-1956   Date of visit: 05/20/2024  Diagnosis-high-grade serous ovarian cancer  Chief complaint/ Reason for visit-generalized weakness  Heme/Onc history:  Oncology History Overview Note  High grade serous, p53 mutated Neg genetics, HRD not detected, MSI stable, low TMB 2, progressed on carboplatin  and gemcitabine , paclitaxel    Malignant neoplasm of both ovaries (HCC)  04/11/2022 Imaging   CT of the abdomen and pelvis on 04/11/2022 reveals a left adnexal masslike area measuring 5.9 x 3.6 cm.  Margins of this mass are irregular.  There is small fluid in the cul-de-sac as well as fullness of soft tissue about the right adnexa.  Discrete uterine structure is not identified.  Left upper quadrant nodules beneath the left hemidiaphragm (1 anterior to the spleen and the other to the stomach).  Omental/peritoneal nodularity and multiple perihepatic nodules are noted    04/18/2022 Tumor Marker   Patient's tumor was tested for the following markers: CA-125. Results of the tumor marker test revealed 123.   04/19/2022 Initial Diagnosis   Carcinomatosis (HCC)   04/28/2022 Imaging   1. Bulky bilateral ovarian masses and nodules, left-greater-than-right, which are essentially confluent with adjacent pelvic soft tissue nodularity and not significantly changed compared to prior examination dated 04/11/2022. 2. Extensive pelvic peritoneal thickening and nodularity. Multiple small peritoneal nodules throughout the  abdomen and pelvis. Findings are consistent with peritoneal metastatic disease and likewise not significantly changed. 3. Small volume of loculated appearing fluid in the low pelvis. 4. 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 The mid to distal right ureter is obstructed by right ovarian mass and or soft tissue nodularity. 5. Calculus within the most distal left ureterovesicular junction or just within the bladder lumen measuring 0.7 cm, unchanged. No associated left-sided hydronephrosis. 6. Soft tissue attenuation nodule of the body of the right adrenal gland. Notably, this was also soft tissue attenuation on prior noncontrast examination (i.e. not definitively macroscopic fat containing) although unchanged compared to prior examinations dating back to 2018 and almost certainly a benign adenoma. Attention on follow-up. 7. No evidence of metastatic disease in the chest. 8. Mild diffuse bilateral bronchial wall thickening. Background of very fine centrilobular nodularity, most concentrated in the lung apices. Findings are most consistent with smoking-related respiratory bronchiolitis.     05/18/2022 Procedure   Ultrasound-guided core biopsy performed of a 10 mm soft tissue peritoneal mass in the right upper quadrant just superficial to the right lobe of the liver. The procedure was performed under general anesthesia immediately following Port-A-Cath placement.   05/18/2022 Procedure   Placement of single lumen port a cath via right internal jugular vein. The catheter tip lies at the cavo-atrial junction. A power injectable port a cath was placed and is ready for immediate use.     05/18/2022 Pathology Results   A. PERITONEAL MASS, RIGHT, BIOPSY:  - Metastatic high grade serous carcinoma (see comment)   COMMENT:   Appropriately controlled immunohistochemical stains reveal tumor cells are positive  for  PAX8, WT1 and p53.  The findings support the above interpretation.  This case was reviewed with Dr. Rebbecca who agrees with the above diagnosis.  A p16 stain is pending and will be reported in an addendum.    05/23/2022 Cancer Staging   Staging form: Ovary, Fallopian Tube, and Primary Peritoneal Carcinoma, AJCC 8th Edition - Clinical stage from 05/23/2022: FIGO Stage IIIC (cT3c, cN0, cM0) - Signed by Lonn Hicks, MD on 05/23/2022 Stage prefix: Initial diagnosis   05/29/2022 Imaging   1. Complex bilateral adnexal masses, as described above, consistent with the patient's known malignancy. 2. Subsequent marked severity mass effect on the distal bilateral common iliac veins, increased in severity when compared to the prior exam. 3. Additional findings consistent with peritoneal metastasis within the pelvis. 4. Findings consistent with pelvic congestion syndrome. 5. Postoperative changes within the mid and lower lumbar spine. 6. Aortic atherosclerosis. 7. No evidence of venous thrombus within the abdomen, pelvis or proximal bilateral lower extremities.   Aortic Atherosclerosis (ICD10-I70.0).   06/02/2022 - 11/03/2022 Chemotherapy   Patient is on Treatment Plan : OVARIAN Carboplatin  (AUC 6) + Paclitaxel  (175) q21d X 6 Cycles     06/26/2022 Tumor Marker   Patient's tumor was tested for the following markers: CA-125. Results of the tumor marker test revealed 134.   08/04/2022 Imaging   CT ABDOMEN PELVIS W CONTRAST  Result Date: 08/04/2022 CLINICAL DATA:  Ovarian cancer, chemotherapy in progress, for restaging EXAM: CT ABDOMEN AND PELVIS WITH CONTRAST TECHNIQUE: Multidetector CT imaging of the abdomen and pelvis was performed using the standard protocol following bolus administration of intravenous contrast. RADIATION DOSE REDUCTION: This exam was performed according to the departmental dose-optimization program which includes automated exposure control, adjustment of the mA and/or kV according to  patient size and/or use of iterative reconstruction technique. CONTRAST:  OMNIPAQUE  IOHEXOL  300 MG/ML  SOLN COMPARISON:  CT venogram abdomen/pelvis dated 05/29/2022. CT abdomen/pelvis dated 04/26/2022. FINDINGS: Lower chest: Lung bases are clear. Hepatobiliary: Liver is within normal limits. Gallbladder is unremarkable. No intrahepatic or extrahepatic ductal dilatation. Pancreas: Within normal limits. Spleen: Within normal limits. Adrenals/Urinary Tract: 2.4 cm right adrenal nodule (series 2/image 17), previously 2.0 cm in 2018, likely reflecting a benign adrenal adenoma. Left adrenal gland is within normal limits. Left kidney is within normal limits. 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 Associated extrinsic compression at the level of the right adnexal mass (described below). Mildly thick-walled bladder, although underdistended. Stable 6 mm left UVJ calcification (series 2/image 68). Stomach/Bowel: Stomach is within normal limits. No evidence of bowel obstruction. Normal appendix (series 2/image 47). Left adnexal mass (described below) is favored to abut and directly invade the left lateral wall of the sigmoid colon (series 2/image 57). Vascular/Lymphatic: No evidence of abdominal aortic aneurysm. Atherosclerotic calcifications of the abdominal aorta and branch vessels. No suspicious abdominopelvic lymphadenopathy. Reproductive: Heterogeneous uterus with associated surface nodularity (series 2/image 65), likely reflecting peritoneal disease. This appearance is improved from the prior. 3.9 x 3.3 cm left adnexal mass (series 2/image 60), previously 5.3 x 3.9 cm, with extension along the sigmoid mesocolon (series 2/image 56) and suspected involvement of the sigmoid colon (series 2/image 57). 5.6 x 2.7 cm mixed cystic/solid right adnexal mass (series 2/image 89), previously 6.9 x 4.3 cm when measured in a similar  fashion. Other: No abdominopelvic ascites. Scattered minimal peritoneal nodularity beneath the anterior abdominal wall, including a 5 mm nodule beneath the left anterior  abdominal wall (series 2/image 27) which previously measured 12 mm. Additional mild nodularity on series 2/images 32, 33, and 50. Musculoskeletal: Status post PLIF at L4-S1. Status post lateral fixation at L4-5. Status post anterior fixation at L5-S1. Mild degenerative changes of the lower thoracic spine. IMPRESSION: Improving bilateral adnexa masses in this patient with known ovarian cancer, as above. Suspected involvement of the sigmoid colon. No evidence of bowel obstruction. Improving peritoneal nodularity, as above. Additional ancillary findings as above. Electronically Signed   By: Pinkie Pebbles M.D.   On: 08/04/2022 01:27      08/16/2022 Pathology Results   CASE: [id number] PATIENT: BARBEE MACE Surgical Pathology Report  Clinical History: Advanced gyn malignancy (crm)  FINAL MICROSCOPIC DIAGNOSIS:  A. OMENTUM, RESECTION: - Positive for carcinoma  B. LIVER ADHESION, EXCISION: - Benign fibroadipose tissue - Negative for carcinoma  C. SMALL BOWEL MESENTERIC IMPLANT, EXCISION: - Positive for carcinoma with extensive necrosis  D. UTERUS, CERVIX, BILATERAL FALLOPIAN TUBES AND OVARIES, AND APPENDIX, RESECTION: - Uterine serosa: Tumor deposits present - Cervix: Benign, nabothian cyst - Endometrium: Benign inactive endometrium, benign endometrial polyp - Myometrium: Leiomyoma - Right fallopian tube: Metastatic tumor deposits present - Right ovary: High-grade serous carcinoma - Left ovary: High-grade serous carcinoma - Benign appendix - See oncology table  E. COLON, RECTOSIGMOID, RESECTION: - Benign colonic mucosa - Margins free of carcinoma - Adherent fallopian tube with metastatic tumor deposits - 2 benign lymph nodes  F. RECTAL TUMOR IMPLANT, EXCISION: - Metastatic high-grade serous carcinoma   G.  COLON, RECTOSIGMOID ANASTOMOTIC RINGS: - Benign anastomotic donuts  ONCOLOGY TABLE:   OVARY or FALLOPIAN TUBE or PRIMARY PERITONEUM: Resection  Procedure: Total abdominal hysterectomy, bilateral salpingo-oophorectomy, appendectomy, rectosigmoid resection, total omentectomy, small bowel nodule resection Specimen Integrity: Intact Tumor Site: Bilateral Tumor Size: Right ovary: 3.9 cm, left ovary: Approximately 1.2 cm Histologic Type: High-grade serous carcinoma Histologic Grade: High-grade Ovarian Surface Involvement: Present Fallopian Tube Surface Involvement: Present Implants: Present, small bowel mesenteric and rectovaginal septum Lymphatic and/or Vascular Invasion: Not identified Other Tissue/ Organ Involvement: Fallopian tube, bilateral Largest Extrapelvic Peritoneal Focus: 1.4 cm, small bowel mesentery Peritoneal/Ascitic Fluid Involvement: Not applicable Chemotherapy Response Score (CRS): Not applicable, no known presurgical therapy Regional Lymph Nodes: Not applicable (no lymph nodes submitted or found) Distant Metastasis:      Distant Site(s) Involved: Not applicable Pathologic Stage Classification (pTNM, AJCC 8th Edition): pT3b, pN[not assigned] Ancillary Studies: Can be performed upon request Representative Tumor Block: D8 Comment(s): Tumor is staged as pT3 given the presence of extrapelvic peritoneal deposits on small bowel mesentery less than 2cm (1.4 cm) ([id number])    09/18/2022 Tumor Marker   Patient's tumor was tested for the following markers: CA-125. Results of the tumor marker test revealed 36.8.   10/15/2022 Genetic Testing   Negative [facility] Multi-Cancer +RNA Panel.  VUS in POLD1 at c.1322C>T (p.Thr441Met). Report date is 10/15/2022.   The Multi-Cancer + RNA Panel offered by [facility] includes sequencing and/or deletion/duplication analysis of the following 70 genes:  AIP*, ALK, APC*, ATM*, AXIN2*, BAP1*, BARD1*, BLM*, BMPR1A*, BRCA1*, BRCA2*, BRIP1*, CDC73*,  CDH1*, CDK4, CDKN1B*, CDKN2A, CHEK2*, CTNNA1*, DICER1*, EPCAM (del/dup only), EGFR, FH*, FLCN*, GREM1 (promoter dup only), HOXB13, KIT, LZTR1, MAX*, MBD4, MEN1*, MET, MITF, MLH1*, MSH2*, MSH3*, MSH6*, MUTYH*, NF1*, NF2*, NTHL1*, PALB2*, PDGFRA, PMS2*, POLD1*, POLE*, POT1*, PRKAR1A*, PTCH1*, PTEN*, RAD51C*, RAD51D*, RB1*, RET, SDHA* (sequencing only), SDHAF2*, SDHB*, SDHC*, SDHD*, SMAD4*, SMARCA4*, SMARCB1*, SMARCE1*, STK11*, SUFU*, TMEM127*, TP53*, TSC1*, TSC2*, VHL*. RNA analysis is performed for * genes.  11/06/2022 Tumor Marker   Patient's tumor was tested for the following markers: CA-125. Results of the tumor marker test revealed 16.8.   12/11/2022 Tumor Marker   Patient's tumor was tested for the following markers: CA-125. Results of the tumor marker test revealed 12.5.   12/14/2022 Imaging   CT ABDOMEN PELVIS W CONTRAST  Result Date: 12/13/2022 CLINICAL DATA:  Ovarian cancer restaging * Tracking Code: BO * EXAM: CT ABDOMEN AND PELVIS WITH CONTRAST TECHNIQUE: Multidetector CT imaging of the abdomen and pelvis was performed using the standard protocol following bolus administration of intravenous contrast. RADIATION DOSE REDUCTION: This exam was performed according to the departmental dose-optimization program which includes automated exposure control, adjustment of the mA and/or kV according to patient size and/or use of iterative reconstruction technique. CONTRAST:  OMNIPAQUE  IOHEXOL  300 MG/ML  SOLN COMPARISON:  08/02/2022 FINDINGS: Lower chest: Unremarkable Hepatobiliary: Unremarkable Pancreas: Unremarkable Spleen: Unremarkable Adrenals/Urinary Tract: 2.6 by 2.1 cm right adrenal mass, internal density 55 Hounsfield units on portal venous phase images, relative washout of 13% which is indeterminate. Back in 2016 I measure this lesion at 2.2 by 1.6 cm, on the more recent comparison of 07/18/2022 this lesion measured 2.6 by 2.1 cm similar to today. Accordingly this lesion is stable from March and  only very minimally increased in size from 2016, most likely benign. This can likely be surveilled in the context of the patient's ongoing oncology CT examinations. 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 The ureters transition to relatively normal caliber in the vicinity of the iliac vessel cross over, a specific cause for the transition is not identified. There is also a duplicated left renal collecting system without hydroureter on the left Stable 7 mm calcification or stone in the vicinity of the left ureterovesical junction. Stomach/Bowel: Prominent stool throughout the colon favors constipation. Anastomotic staple line in the sigmoid colon. No dilated small bowel. Interval appendectomy. Previous nodularity along bowel including the sigmoid colon no longer identified. Vascular/Lymphatic: Atherosclerosis is present, including aortoiliac atherosclerotic disease. No pathologic adenopathy. Reproductive: Interval hysterectomy with salpingo-oophorectomy. Other: Interval omentectomy. No current compelling findings of peritoneal tumor deposits. No ascites. Musculoskeletal: Prior fusion at L4-L5-S1. IMPRESSION: 1. Interval hysterectomy, omentectomy, and appendectomy. No compelling findings of recurrent or residual peritoneal tumor deposits. 2. 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 A specific cause for the transition in caliber is not identified. 3. Stable 7 mm calcification or stone in the vicinity of the left ureterovesical junction. Duplicated left renal collecting system. 4. Prominent stool throughout the colon favors constipation. 5. Chronically stable but otherwise nonspecific right adrenal mass, likely benign. 6. Aortic atherosclerosis.  Aortic Atherosclerosis (ICD10-I70.0). Electronically Signed   By: Ryan Salvage M.D.   On: 12/13/2022 15:16      12/20/2022 Procedure   Successful removal of a RIGHT chest implanted Port-A-Cath.    03/19/2023 Tumor Marker   Patient's tumor was tested for the following markers: CA-125. Results of the tumor marker test revealed 19.7.   06/14/2023 Imaging   CT ABDOMEN PELVIS W CONTRAST Result Date: 06/21/2023 CLINICAL DATA:  Restaging ovarian cancer.  * Tracking Code: BO * EXAM: CT ABDOMEN AND PELVIS WITH CONTRAST TECHNIQUE: Multidetector CT imaging of the abdomen and pelvis was performed using the standard protocol following bolus administration of  intravenous contrast. RADIATION DOSE REDUCTION: This exam was performed according to the departmental dose-optimization program which includes automated exposure control, adjustment of the mA and/or kV according to patient size and/or use of iterative reconstruction technique. CONTRAST:  OMNIPAQUE  IOHEXOL  300 MG/ML  SOLN COMPARISON:  CT abdomen and pelvis dated 12/08/2022 FINDINGS: Lower chest: No focal consolidation or pulmonary nodule in the lung bases. No pleural effusion or pneumothorax demonstrated. Partially imaged heart size is normal. Hepatobiliary: No focal hepatic lesions. No intra or extrahepatic biliary ductal dilation. Normal gallbladder. Pancreas: No focal lesions or main ductal dilation. Spleen: Normal in size without focal abnormality. Adrenals/Urinary Tract: Unchanged 2.0 cm right adrenal nodule measuring 43 HU (2:16). No left adrenal nodule. 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 Duplex left kidney. No hydronephrosis. Unchanged 7 mm calcification adjacent to the left ureterovesical junction. No focal bladder wall thickening. Stomach/Bowel: Normal appearance of the stomach. Postsurgical changes from rectosigmoid resection with patent anastomosis. No evidence of bowel wall thickening, distention, or inflammatory changes.  Appendectomy. Vascular/Lymphatic: Aortic atherosclerosis. New and increased size of multi station lymphadenopathy and peritoneal nodules, for example: -12 mm cardiophrenic (2:2), previously partially imaged at 3 mm -new 12 mm midline epigastric anterior peritoneal (2:23) -new 3.1 x 2.9 cm peripancreatic (2:25) -14 mm right pelvic sidewall (2:56), previously 4 mm -11 mm right external iliac (2:60), previously 7 mm Reproductive: Status post hysterectomy and bilateral salpingo-oophorectomy. No adnexal masses. Increased lobulated soft tissue density adjacent to the right vaginal cuff measuring 2.9 x 1.8 cm (2:64), previously 2.4 x 1.7 cm Other: Status post total omentectomy. No free fluid, fluid collection, or free air. Musculoskeletal: No acute or abnormal lytic or blastic osseous lesions. Postsurgical changes of L4-S1 spinal fixation. Hardware appears intact. Postsurgical changes of the anterior abdominal wall. IMPRESSION: 1. New and increased size of multi station lymphadenopathy and peritoneal nodules, consistent with metastatic disease. 2. Increased lobulated soft tissue density adjacent to the right vaginal cuff measuring 2.9 x 1.8 cm, previously 2.4 x 1.7 cm, suspicious for recurrent disease. 3. Unchanged 2.0 cm right adrenal nodule, favored benign. Recommend continued attention on follow-up. 4. 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 Duplex left kidney. No hydronephrosis. Unchanged 7 mm calcification adjacent to the left ureterovesical junction. 5.  Aortic Atherosclerosis (ICD10-I70.0). Electronically Signed   By: Limin  Xu M.D.   On: 06/21/2023 09:35      06/18/2023 Tumor Marker   Patient's tumor was tested for the following markers: CA-125. Results of the tumor marker test revealed 32.9.   07/03/2023 Procedure   Successful placement of a power injectable Port-A-Cath via the right internal jugular vein. The catheter is ready for immediate use.   07/04/2023 - 08/31/2023 Chemotherapy    Patient is on Treatment Plan : OVARIAN RECURRENT 3RD LINE Bevacizumab  D1 + Carboplatin  D1 + Gemcitabine  D1,8 (4/800) q21d     07/13/2023 Tumor Marker   Patient's tumor was tested for the following markers: CA-125. Results of the tumor marker test revealed 33.2.   08/17/2023 Tumor Marker   Patient's tumor was tested for the following markers: CA-125. Results of the tumor marker test revealed 16.1.   08/30/2023 Imaging   1. Status post hysterectomy and bilateral salpingo-oophorectomy. 2. Multistation adenopathy and peritoneal nodularity is again noted, compatible with metastatic disease. Mild increased size of peripancreatic node. Upper abdominal peritoneal nodule is slightly decreased in size in the interval. Additional index lymph nodes are stable in the interval. 3. Increased soft tissue density adjacent to the vaginal cuff,  concerning for locally recurrent disease. 4. Duplex right kidney with mild hydronephrosis and hydroureter, unchanged. Stone within the left UVJ is again noted measuring 0.8 x 0.4 cm, unchanged from previous exam. 5. Stable right adrenal nodule.  None 6. Supraumbilical ventral abdominal wall hernia containing a nonobstructed loop of small bowel. 7.  Aortic Atherosclerosis (ICD10-I70.0).       09/27/2023 - 11/15/2023 Chemotherapy   Patient is on Treatment Plan : OVARIAN Paclitaxel  (80) I8,1,84,77 q28d     09/28/2023 Tumor Marker   Patient's tumor was tested for the following markers: CA-125. Results of the tumor marker test revealed 29.8.   11/05/2023 Tumor Marker   Patient's tumor was tested for the following markers: CA-125. Results of the tumor marker test revealed 40.   11/16/2023 Tumor Marker   Patient's tumor was tested for the following markers: CA-125. Results of the tumor marker test revealed 32.3.   11/22/2023 Imaging   CT ABDOMEN PELVIS W CONTRAST Result Date: 11/28/2023 CLINICAL DATA:  Ovarian cancer.  * Tracking Code: BO * EXAM: CT ABDOMEN AND  PELVIS WITH CONTRAST TECHNIQUE: Multidetector CT imaging of the abdomen and pelvis was performed using the standard protocol following bolus administration of intravenous contrast. RADIATION DOSE REDUCTION: This exam was performed according to the departmental dose-optimization program which includes automated exposure control, adjustment of the mA and/or kV according to patient size and/or use of iterative reconstruction technique. CONTRAST:  OMNIPAQUE  IOHEXOL  300 MG/ML  SOLN COMPARISON:  08/30/2023. FINDINGS: Lower chest: Lung bases are clear. Prepericardiac lymph node measures 1.6 cm, enlarged from 1.1 cm on 08/30/2023. Heart is at the upper limits of normal in size. No pericardial or pleural effusion. Distal esophagus is grossly unremarkable. Hepatobiliary: Liver and gallbladder are unremarkable. No biliary ductal dilatation. Pancreas: Negative. Spleen: Negative. Adrenals/Urinary Tract: 2.4 cm right adrenal nodule, chronically stable and characterized previously 2 as an adenoma. No specific follow-up necessary. Minimal nodular thickening of the left adrenal gland, unchanged. Minimal scarring in the right kidney. Kidneys are otherwise unremarkable. Difficulty did right ureter appears to join in the midportion. Right ureter is decompressed. Left ureter may be minimally dilated secondary to an 8 mm stone at the left ureteral orifice, unchanged from 08/30/2023. Bladder is otherwise grossly unremarkable. Stomach/Bowel: Stomach, small bowel and colon are unremarkable. Appendix is not readily visualized. Vascular/Lymphatic: Atherosclerotic calcification of the aorta. Heterogeneous nodal mass or peritoneal implant between the distal stomach and pancreas measures 3.7 x 5.3 cm (2/29), enlarged from 3.0 x 3.7 cm. Right external iliac lymph node measures 7 mm (2/58), previously 12 mm. A second right external iliac lymph node measures 7 mm (2/64), stable. Reproductive: Hysterectomy. Similar asymmetric nodular soft  tissue along the right vaginal cuff (2/66-67). No adnexal mass. Other: Previously seen ventral midline epigastric peritoneal nodule is no longer visualized. No free fluid. Small incisional hernia contains fat along the ventral supraumbilical midline. Musculoskeletal: Osteopenia. Degenerative changes in the spine. L5-S1 fusion. IMPRESSION: 1. Enlarging prepericardiac nodal metastasis. Enlarging nodal mass or peritoneal implant between the distal stomach and pancreas. 2. Resolved ventral midline epigastric peritoneal metastasis. 3. Right external iliac lymph nodes are stable to smaller in size. 4. Asymmetric nodular thickening of the right vaginal cuff, stable. 5. Trace left ureteral dilatation with a chronic 8 mm stone at the left ureteral orifice. 6. Stable right adrenal nodule, previously characterized as an adenoma. 7.  Aortic atherosclerosis (ICD10-I70.0). Electronically Signed   By: Newell Eke M.D.   On: 11/28/2023 13:56  02/22/2024 Imaging   CT ABDOMEN PELVIS W CONTRAST Result Date: 02/24/2024 CLINICAL DATA:  Ovarian cancer, monitor, off chemotherapy. * Tracking Code: BO * EXAM: CT ABDOMEN AND PELVIS WITH CONTRAST TECHNIQUE: Multidetector CT imaging of the abdomen and pelvis was performed using the standard protocol following bolus administration of intravenous contrast. RADIATION DOSE REDUCTION: This exam was performed according to the departmental dose-optimization program which includes automated exposure control, adjustment of the mA and/or kV according to patient size and/or use of iterative reconstruction technique. CONTRAST:  OMNIPAQUE  IOHEXOL  300 MG/ML  SOLN COMPARISON:  Multiple priors including CT November 22, 2023 FINDINGS: Lower chest: No acute abnormality. Centrally necrotic pericardiophrenic lymph node measures 18 mm in short axis on image 3/2 previously 16 mm Hepatobiliary: No suspicious hepatic lesion. Gallbladder is unremarkable. No biliary ductal dilation. Pancreas: No  pancreatic ductal dilation or evidence of acute inflammation. Spleen: No splenomegaly. Adrenals/Urinary Tract: 2.1 cm right adrenal nodule on image 20/2 stable over multiple prior examinations and previously characterized as a benign adenoma. Left adrenal gland appears normal. 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 Similar prominence of the left ureter with 8 mm stone at the UVJ. Stomach/Bowel: Stomach is minimally distended limiting evaluation. No pathologic dilation of small or large bowel. No evidence of acute bowel inflammation. Vascular/Lymphatic: Normal caliber abdominal aorta. Smooth IVC contours. Narrowing of the portal vein and portal confluence by a heterogeneous enlarged lymph node/mass which measures 7.4 x 6.5 cm on image 27/2 previously 4.7 x 4.1 cm when remeasured for consistency, this sits between the pancreatic head and distal stomach. Previously indexed right external iliac lymph node measures 9 mm in short axis on image 58/2 previously 7 mm -additional right external iliac lymph node previously indexed measures 6 mm in short axis on image 66/2 previously 7 mm. Reproductive: Prior hysterectomy with similar asymmetric nodular soft tissue on the right side of the vaginal cuff on image 67/2. no suspicious adnexal mass. Other: No significant abdominopelvic free fluid. Postsurgical change in the anterior abdominal wall with fat containing incisional hernia. Musculoskeletal: No aggressive lytic or blastic lesion of bone. Spinal fusion hardware. Diffuse demineralization of bone. Thoracolumbar spondylosis. IMPRESSION: 1. Interval increase in size of the heterogeneous enlarged lymph node/mass between the pancreatic head and distal stomach. 2. Slight interval increase in size of the centrally necrotic pericardiophrenic lymph node. 3. Previously indexed right external iliac lymph nodes are similar to slightly increased in size. 4. Prior hysterectomy with  similar asymmetric nodular soft tissue on the right side of the vaginal cuff. 5. Similar prominence of the left ureter with 8 mm stone at the UVJ. 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 Electronically Signed   By: Reyes Holder M.D.   On: 02/24/2024 13:03   MR BRAIN W WO CONTRAST Result Date: 02/18/2024 CLINICAL DATA:  Multiple sclerosis. EXAM: MRI HEAD WITHOUT AND WITH CONTRAST TECHNIQUE: Multiplanar, multiecho pulse sequences of the brain and surrounding structures were obtained without and with intravenous contrast. CONTRAST:  8 mL Vueway  COMPARISON:  Head MRI 10/26/2022 FINDINGS: Brain: There is no evidence of an acute infarct, intracranial hemorrhage, mass, midline shift, or extra-axial fluid collection. Cerebral volume is within normal limits for age. The ventricles are normal in size. T2 hyperintensities in the juxtacortical, periventricular, and deep cerebral white matter bilaterally and in the pons are unchanged and moderately advanced for age. None show enhancement or restricted diffusion. Mild T2 heterogeneity in the thalami and at most  minimal in the cerebellum are unchanged. Vascular: Major intracranial vascular flow voids are preserved. Skull and upper cervical spine: Unremarkable bone marrow signal. Sinuses/Orbits: Unremarkable orbits. Paranasal sinuses and mastoid air cells are clear. Other: None. IMPRESSION: Unchanged moderately advanced white matter disease, nonspecific but may reflect a combination chronic demyelinating disease and chronic small vessel ischemia. No evidence of active demyelination. Electronically Signed   By: Dasie Hamburg M.D.   On: 02/18/2024 08:06      03/29/2024 Imaging   US  Venous Img Upper Uni Left (DVT) Result Date: 04/01/2024 CLINICAL DATA:  LEFT upper extremity pain. History of tobacco use and ovarian malignancy. EXAM: LEFT UPPER EXTREMITY VENOUS DOPPLER ULTRASOUND TECHNIQUE: Gray-scale sonography with graded  compression, as well as color Doppler and duplex ultrasound were performed to evaluate the upper extremity deep venous system from the level of the subclavian vein and including the jugular, axillary, basilic, radial, ulnar and upper cephalic vein. Spectral Doppler was utilized to evaluate flow at rest and with distal augmentation maneuvers. COMPARISON:  None available FINDINGS: Internal Jugular Vein: No evidence of thrombus. Normal compressibility, respiratory phasicity and response to augmentation. Subclavian Vein: No evidence of thrombus. Normal compressibility, respiratory phasicity and response to augmentation. Axillary Vein: No evidence of thrombus. Normal compressibility, respiratory phasicity and response to augmentation. Cephalic Vein: Intramural filling defect with decreased flow and compressibility of the cephalic vein and adjacent varicose vein at the level of the mid upper arm is consistent with acute DVT. This also corresponds to the patient's palpable abnormality. Basilic Vein: No evidence of thrombus. Normal compressibility, respiratory phasicity and response to augmentation. Brachial Veins: No evidence of thrombus. Normal compressibility, respiratory phasicity and response to augmentation. Radial Veins: No evidence of thrombus. Normal compressibility, respiratory phasicity and response to augmentation. Ulnar Veins: No evidence of thrombus. Normal compressibility, respiratory phasicity and response to augmentation. Venous Reflux:  None visualized. Other Findings:  None visualized. IMPRESSION: 1. No DVT of the LEFT upper extremity. 2. Acute superficial venous thrombosis of the cephalic vein and adjacent varicose vein at the level of the mid upper arm. Electronically Signed   By: Aliene Lloyd M.D.   On: 04/01/2024 07:35   CT ANGIO GI BLEED Result Date: 03/29/2024 CLINICAL DATA:  Lightheadedness, dark stool for 1 month, epigastric pain for several days, history of ovarian cancer EXAM: CTA ABDOMEN  AND PELVIS WITHOUT AND WITH CONTRAST TECHNIQUE: Multidetector CT imaging of the abdomen and pelvis was performed using the standard protocol during bolus administration of intravenous contrast. Multiplanar reconstructed images and MIPs were obtained and reviewed to evaluate the vascular anatomy. RADIATION DOSE REDUCTION: This exam was performed according to the departmental dose-optimization program which includes automated exposure control, adjustment of the mA and/or kV according to patient size and/or use of iterative reconstruction technique. CONTRAST:  OMNIPAQUE  IOHEXOL  350 MG/ML SOLN COMPARISON:  02/22/2024 FINDINGS: VASCULAR Aorta: Normal caliber aorta without aneurysm, dissection, vasculitis or significant stenosis. Mild atherosclerosis. Celiac: Patent without evidence of aneurysm, dissection, vasculitis or significant stenosis. SMA: Patent without evidence of aneurysm, dissection, vasculitis or significant stenosis. Renals: Both renal arteries are patent without evidence of aneurysm, dissection, vasculitis, fibromuscular dysplasia or significant stenosis. IMA: Patent without evidence of aneurysm, dissection, vasculitis or significant stenosis. Inflow: Patent without evidence of aneurysm, dissection, vasculitis or significant stenosis. Evaluation is slightly limited due to streak artifact from lumbar orthopedic hardware. Proximal Outflow: Bilateral common femoral and visualized portions of the superficial and profunda femoral arteries are patent without evidence of aneurysm, dissection, vasculitis or  significant stenosis. Veins: Continue mass effect upon the portal vein confluence due to the large mass between the duodenum and pancreatic head. No other acute venous abnormalities. Review of the MIP images confirms the above findings. NON-VASCULAR Lower chest: Pericardiophrenic lymph node is again identified, measuring 2.4 x 2.2 cm, previously 2.5 x 2.0 cm. No acute airspace disease. Hepatobiliary: No  focal liver abnormality is seen. No gallstones, gallbladder wall thickening, or biliary dilatation. Pancreas: Unremarkable. No pancreatic ductal dilatation or surrounding inflammatory changes. Spleen: Normal in size without focal abnormality. Adrenals/Urinary Tract: Kidneys enhance normally. No urinary tract calculi. 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 No left-sided obstruction. 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 No specific follow-up is required. Left adrenal is unremarkable. Bladder is normal. Stomach/Bowel: No bowel obstruction or ileus. Prior sigmoid colon resection and reanastomosis. Previous appendectomy. There is a large necrotic 7.8 x 8.7 x 8.1 cm mass interposed between the head of the pancreas and duodenal ball. This mass displaces and likely invades the wall of the distal stomach and proximal duodenum, and may be the source for chronic blood loss. However, on today's exam, there is no evidence of contrast accumulation within the bowel lumen to suggest active hemorrhage. Lymphatic: Previous index enlarged lymph node in the right external iliac chain now measures 12 mm in short axis reference image 62/13, previously 9 mm. Stable right external iliac/inguinal lymph node image 69/13 measuring 6 mm in short axis. No new adenopathy is identified. Reproductive: Stable postsurgical changes from hysterectomy. Other: No free fluid or free intraperitoneal gas. Enlarged para incisional hernia in the right supraumbilical region, now containing a short segment of small bowel. No evidence of incarceration or obstruction. Musculoskeletal: Stable postsurgical changes within the lower lumbar spine. There are no acute or destructive bony abnormalities. Reconstructed images demonstrate no additional findings. IMPRESSION: VASCULAR 1. No evidence of active gastrointestinal bleeding. 2.  Aortic Atherosclerosis (ICD10-I70.0).  NON-VASCULAR 1. Enlarging necrotic soft tissue mass interposed between the head of the pancreas and proximal duodenum. There is likely invasion of the dorsal wall of the distal stomach and duodenum, and this could be a source for chronic blood loss given clinical history. There is no evidence of active hemorrhage on today's exam. 2. Progressive adenopathy within the pericardiophrenic region and right external iliac chain consistent with worsening metastatic disease. 3. Para incisional hernia in the right supraumbilical region, containing a short segment of small bowel. No incarceration or obstruction. Electronically Signed   By: Ozell Daring M.D.   On: 03/29/2024 16:19   DG Chest 2 View Result Date: 03/29/2024 CLINICAL DATA:  Chest pain. EXAM: CHEST - 2 VIEW COMPARISON:  Chest CT dated 09/19/2022. FINDINGS: Right-sided Port-A-Cath with tip over central SVC. No focal consolidation, pleural effusion, pneumothorax. The cardiac silhouette is within normal limits. No acute osseous pathology. Degenerative changes of the shoulders. IMPRESSION: No active cardiopulmonary disease. Electronically Signed   By: Vanetta Chou M.D.   On: 03/29/2024 13:01        Interval history- [name] is a 69 y.o. female with above history of recurrent ovarian cancer who presents to symptom management clinic for complaints of generalized weakness and fatigue.  She is concerned that she needs a blood transfusion.  Symptoms are gradual in onset over the last several weeks.  She has met with Dr. Babara and is currently considering taking additional treatments for her cancer.  She had an appointment with interventional radiology today for  consideration of possible embolization of known duodenal mass but canceled her appointment due to her symptoms.  Says that she eats very little and food feels heavy in her stomach.  It worsens her pain.  She is taking oxycodone  extended release.  Has not had any short acting pain medicine today  due to preference.  She has not tried any nutritional supplements.  No nausea or vomiting.  Abdomen does not feel distended.  Denies chest pain or shortness of breath.  Denies constipation or diarrhea.  She endorses weight loss  Review of systems- Review of Systems  Constitutional:  Positive for malaise/fatigue and weight loss. Negative for chills and fever.  HENT:  Negative for hearing loss, nosebleeds, sore throat and tinnitus.   Eyes:  Negative for blurred vision and double vision.  Respiratory:  Negative for cough, hemoptysis, shortness of breath and wheezing.   Cardiovascular:  Negative for chest pain, palpitations and leg swelling.  Gastrointestinal:  Negative for abdominal pain, blood in stool, constipation, diarrhea, melena, nausea and vomiting.  Genitourinary:  Negative for dysuria, frequency, hematuria and urgency.  Musculoskeletal:  Negative for back pain, falls, joint pain and myalgias.  Skin:  Negative for itching and rash.  Neurological:  Positive for weakness. Negative for dizziness, tingling, sensory change, loss of consciousness and headaches.  Endo/Heme/Allergies:  Negative for environmental allergies. Does not bruise/bleed easily.  Psychiatric/Behavioral:  Negative for depression. The patient is not nervous/anxious and does not have insomnia.     Allergies[1]  Past Medical History:  Diagnosis Date   Anxiety    Asthma    Back problem    disc disease, chronic low back pain, right leg pain, s/p discectomy 99   Cancer (HCC)    ovarian cancer   Depression    Diastolic dysfunction    with elevated LVEDP at cath   Endometriosis    Fibroids    hx of    GERD (gastroesophageal reflux disease)    Heart murmur    Hepatitis C    treated 8-9 years ago   Hidradenitis suppurativa    History of kidney stones    Hypertension    MS (multiple sclerosis)    Neuromuscular disorder (HCC)    Mulitple sclerosis   Osteoarthritis    Pre-diabetes    Prediabetes    RA (rheumatoid  arthritis) (HCC)    Rotator cuff tear    right shoulder    Syncope     Past Surgical History:  Procedure Laterality Date   ANGIOPLASTY     BACK SURGERY     lumbar surgery x 2 done in [place]  and [place]   COLON RESECTION SIGMOID  08/16/2022   Procedure: COLON RESECTION SIGMOID;  Surgeon: Viktoria Comer SAUNDERS, MD;  Location: [facility];  Service: Gynecology;;   COLONOSCOPY  07/2010   tubular adenoma   COLONOSCOPY  08/2015   CYSTOSCOPY N/A 08/16/2022   Procedure: CYSTOSCOPY;  Surgeon: Viktoria Comer SAUNDERS, MD;  Location: [facility];  Service: Gynecology;  Laterality: N/A;   DISKECTOMY     ESOPHAGOGASTRODUODENOSCOPY N/A 03/30/2024   Procedure: EGD (ESOPHAGOGASTRODUODENOSCOPY);  Surgeon: Maryruth Ole DASEN, MD;  Location: [facility] END[facility];  Service: Endoscopy;  Laterality: N/A;   HYSTERECTOMY ABDOMINAL WITH SALPINGO-OOPHORECTOMY  08/16/2022   Procedure: HYSTERECTOMY ABDOMINAL WITH SALPINGO-OOPHORECTOMY;  Surgeon: Viktoria Comer SAUNDERS, MD;  Location: [facility];  Service: Gynecology;;   IR IMAGING GUIDED PORT INSERTION  05/18/2022   IR IMAGING GUIDED PORT INSERTION  07/03/2023   IR REMOVAL TUN ACCESS  W/ PORT W/O FL MOD SED  12/20/2022   IR US  GUIDE BX ASP/DRAIN  05/18/2022   LAPAROSCOPY N/A 08/16/2022   Procedure: LAPAROSCOPY DIAGNOSTIC;  Surgeon: Viktoria Comer SAUNDERS, MD;  Location: [facility];  Service: Gynecology;  Laterality: N/A;   laparoscopy for endometriosis     left shoulder scope     RADIOLOGY WITH ANESTHESIA N/A 05/18/2022   Procedure: IR WITH ANESTHESIA PORT AND BIOPSY;  Surgeon: Luverne Aran, MD;  Location: [facility];  Service: Radiology;  Laterality: N/A;   ROTATOR CUFF REPAIR Left    Spinal Fusion  12/2016   L4-S1    Social History   Socioeconomic History   Marital status: Single    Spouse name: Not on file   Number of children: 1   Years of education: Not on file   Highest education level: Not on file  Occupational History   Not on file  Tobacco Use   Smoking status: Some Days     Current packs/day: 0.00    Average packs/day: 0.1 packs/day for 27.0 years (2.7 ttl pk-yrs)    Types: Cigarettes    Start date: 74    Last attempt to quit: 2023    Years since quitting: 3.0   Smokeless tobacco: Never  Vaping Use   Vaping status: Never Used  Substance and Sexual Activity   Alcohol use: Not Currently   Drug use: Yes    Types: Oxycodone    Sexual activity: Not Currently  Other Topics Concern   Not on file  Social History Narrative   Lives alone   Right handed   Caffeine: none    Social Drivers of Health   Tobacco Use: High Risk (05/16/2024)   Patient History    Smoking Tobacco Use: Some Days    Smokeless Tobacco Use: Never    Passive Exposure: Not on file  Financial Resource Strain: Not on file  Food Insecurity: No Food Insecurity (04/24/2024)   Epic    Worried About Programme Researcher, Broadcasting/film/video in the Last Year: Never true    Ran Out of Food in the Last Year: Never true  Transportation Needs: No Transportation Needs (04/24/2024)   Epic    Lack of Transportation (Medical): No    Lack of Transportation (Non-Medical): No  Physical Activity: Not on file  Stress: Not on file  Social Connections: Socially Isolated (04/24/2024)   Social Connection and Isolation Panel    Frequency of Communication with Friends and Family: Three times a week    Frequency of Social Gatherings with Friends and Family: Never    Attends Religious Services: Never    Database Administrator or Organizations: No    Attends Banker Meetings: Never    Marital Status: Divorced  Catering Manager Violence: Not At Risk (04/24/2024)   Epic    Fear of Current or Ex-Partner: No    Emotionally Abused: No    Physically Abused: No    Sexually Abused: No  Depression (PHQ2-9): Low Risk (05/20/2024)   Depression (PHQ2-9)    PHQ-2 Score: 0  Alcohol Screen: Not on file  Housing: Low Risk (05/20/2024)   Epic    Unable to Pay for Housing in the Last Year: No    Number of Times Moved in the  Last Year: 0    Homeless in the Last Year: No  Utilities: Not At Risk (04/24/2024)   Epic    Threatened with loss of utilities: No  Health Literacy: Not on file  Family History  Problem Relation Age of Onset   Heart disease Mother        Died with MI 75, chest pain 30s   Hypertension Mother    Pancreatitis Father    Atrial fibrillation Sister    Breast cancer Maternal Aunt        dx <50   Stomach cancer Maternal Grandfather        mets to liver? dx after 66   Multiple sclerosis Cousin    Breast cancer Cousin        mat female cousin; dx unknown age   Colon cancer Cousin 54       mat female cousin; mets   Leukemia Cousin 106       mat female cousin    Current Medications[2]  Physical exam:  Vitals:   05/20/24 1439  BP: 108/73  Pulse: 100  Resp: (!) 24  Temp: 99.2 F (37.3 C)  TempSrc: Tympanic  SpO2: 94%  Weight: 148 lb 3.2 oz (67.2 kg)   Physical Exam Vitals reviewed.  Constitutional:      Appearance: She is not ill-appearing.     Comments: Ambulating with cane.  Abdominal:     General: There is no distension.     Tenderness: There is abdominal tenderness (upper abdomen tender to deep palpation). There is no guarding.  Skin:    Coloration: Skin is not pale.  Neurological:     Mental Status: She is alert and oriented to person, place, and time.  Psychiatric:        Mood and Affect: Mood normal.        Behavior: Behavior normal.         Latest Ref Rng & Units 05/20/2024    2:13 PM  CMP  Glucose 70 - 99 mg/dL 872   BUN 8 - 23 mg/dL 13   Creatinine 9.55 - 1.00 mg/dL 9.15   Sodium 864 - 854 mmol/L 132   Potassium 3.5 - 5.1 mmol/L 4.0   Chloride 98 - 111 mmol/L 96   CO2 22 - 32 mmol/L 25   Calcium 8.9 - 10.3 mg/dL 9.0   Total Protein 6.5 - 8.1 g/dL 6.6   Total Bilirubin 0.0 - 1.2 mg/dL 0.3   Alkaline Phos 38 - 126 U/L 80   AST 15 - 41 U/L 21   ALT 0 - 44 U/L 10       Latest Ref Rng & Units 05/20/2024    2:13 PM  CBC  WBC 4.0 - 10.5 K/uL 7.3    Hemoglobin 12.0 - 15.0 g/dL 8.5   Hematocrit 63.9 - 46.0 % 27.1   Platelets 150 - 400 K/uL 409     No images are attached to the encounter.  DG Chest 2 View Result Date: 05/09/2024 CLINICAL DATA:  Chest pain EXAM: CHEST - 2 VIEW COMPARISON:  March 29, 2024 FINDINGS: The heart size and mediastinal contours are within normal limits. Stable right internal jugular Port-A-Cath. Both lungs are clear. The visualized skeletal structures are unremarkable. IMPRESSION: No active cardiopulmonary disease. Electronically Signed   By: Lynwood Landy Raddle M.D.   On: 05/09/2024 13:59    Assessment and plan- Patient is a 69 y.o. female diagnosed with recurrent high-grade serous ovarian cancer who presents to symptom management clinic for complaints of:   Weakness & Fatigue -suspect secondary to limited intake and known malignancy.  I recommend increasing calorie intake and sipping nutritional supplements.  I provided her with samples of Ensure  complete today.  If she cannot tolerate the consistency she can dilute it with fair life milk.  I will schedule her to see Joli.  She is taking dexamethasone  daily and mirtazapine . 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  She canceled her appointment with IR for possible embolization today due to her above symptoms.  I encouraged her to follow-up with them.  Hemoglobin today is 8.5, therefore, we will hold off on transfusion today.  I recommend ongoing management of her anemia with Dr. Babara.  I will add a lab visit for her appointment later this week for possible blood transfusion on Friday.  Cancer related pain-localizes pain to site of known malignancy which is chronic and unchanged.  She limits her use of short acting pain medicine due to preference.  I suspect this may also be contributing to her fatigue and reviewed that improved pain control may improve her intake and fatigue.    Disposition:  Add Port labs on  Thursday (1/15) Add appt with Joli Keep appt as is.... [place]   Visit Diagnosis 1. Iron  deficiency anemia due to chronic blood loss   2. Bilateral primary ovarian cancer (HCC)   3. Symptomatic anemia   4. Cancer associated pain     Patient expressed understanding and was in agreement with this plan. She also understands that She can call clinic at any time with any questions, concerns, or complaints.   Thank you for allowing me to participate in the care of this very pleasant patient.   Tinnie Dawn, DNP, AGNP-C, AOCNP [facility] [phone]  CC:        [1]  Allergies Allergen Reactions   Cymbalta [Duloxetine Hcl] Shortness Of Breath, Swelling and Other (See Comments)    Tongue and leg swelling   Cyclobenzaprine Hcl Other (See Comments)    Immobility   Celexa  [Citalopram Hydrobromide] Swelling and Other (See Comments)    Tongue swelling   Fluticasone  Furoate-Vilanterol Swelling and Other (See Comments)    BREO ELLIPTA - Tongue swelling, but breathing not affected   Gabapentin Other (See Comments)    Feels awful when taking, drowsy    Opana  [Oxymorphone Hcl] Itching and Other (See Comments)    Loss of hair, also   Tramadol Other (See Comments)    Dizziness   [2]  Current Outpatient Medications:    albuterol  (VENTOLIN  HFA) 108 (90 Base) MCG/ACT inhaler, Inhale 2 puffs into the lungs every 6 (six) hours as needed for shortness of breath., Disp: , Rfl:    amphetamine -dextroamphetamine  (ADDERALL ) 20 MG tablet, Take 10-20 mg by mouth See admin instructions. Take 20 mg by mouth in the morning and 10 mg between 1-2 PM daily, Disp: , Rfl:    Ascorbic Acid (VITAMIN C) 1000 MG tablet, Take 1,000 mg by mouth daily., Disp: , Rfl:    ASHWAGANDHA PO, Take 1 capsule by mouth daily., Disp: , Rfl:    baclofen  (LIORESAL ) 10 MG tablet, Take 10 mg by mouth 3 (three) times daily as needed., Disp: , Rfl:    BLACK CURRANT SEED OIL PO, Take 1 capsule by mouth daily.,  Disp: , Rfl:    dexamethasone  (DECADRON ) 4 MG tablet, Take 1 tablet (4 mg total) by mouth daily., Disp: 30 tablet, Rfl: 6   hydroxychloroquine (PLAQUENIL) 200 MG tablet, Take 200 mg by mouth 2 (two) times daily., Disp: , Rfl:    lidocaine -prilocaine  (EMLA ) cream, Apply to affected area once,  Disp: 30 g, Rfl: 3   LORazepam  (ATIVAN ) 0.5 MG tablet, Take 0.5 mg by mouth 3 (three) times daily as needed for anxiety., Disp: , Rfl:    mirtazapine  (REMERON ) 7.5 MG tablet, Take 7.5 mg by mouth at bedtime., Disp: , Rfl:    Multiple Vitamin (MULTIVITAMIN WITH MINERALS) TABS tablet, Take 1 tablet by mouth daily., Disp: , Rfl:    naloxone  (NARCAN ) nasal spray 4 mg/0.1 mL, Place 1 spray into the nose as needed (opioid overdose)., Disp: , Rfl:    omeprazole  (PRILOSEC) 20 MG capsule, TAKE 1 CAPSULE BY MOUTH EVERY DAY, Disp: 90 capsule, Rfl: 1   ondansetron  (ZOFRAN ) 8 MG tablet, Take 1 tablet (8 mg total) by mouth every 8 (eight) hours as needed for nausea or vomiting., Disp: 30 tablet, Rfl: 1   OVER THE COUNTER MEDICATION, Take 2 capsules by mouth daily. [place] advanced supplement, Disp: , Rfl:    oxyCODONE  ER (XTAMPZA  ER) 13.5 MG C12A, Take 13.5 mg by mouth in the morning, at noon, and at bedtime., Disp: , Rfl:    Oxycodone  HCl 20 MG TABS, Take 20 mg by mouth 5 (five) times daily as needed (pain)., Disp: , Rfl:    polyethylene glycol (MIRALAX  / GLYCOLAX ) 17 g packet, Take 17 g by mouth daily., Disp: , Rfl:    prochlorperazine  (COMPAZINE ) 10 MG tablet, Take 1 tablet (10 mg total) by mouth every 6 (six) hours as needed for nausea or vomiting., Disp: 30 tablet, Rfl: 1   Vitamin D, Ergocalciferol, (DRISDOL) 1.25 MG (50000 UNIT) CAPS capsule, Take 50,000 Units by mouth every Sunday., Disp: , Rfl:    WIXELA INHUB 250-50 MCG/ACT AEPB, Inhale 1 puff into the lungs daily., Disp: , Rfl:   "

## 2024-05-20 NOTE — Addendum Note (Signed)
 Addended by: BABARA CALL on: 05/20/2024 01:12 PM   Modules accepted: Orders

## 2024-05-21 ENCOUNTER — Inpatient Hospital Stay

## 2024-05-21 LAB — CA 125: Cancer Antigen (CA) 125: 60.1 U/mL — ABNORMAL HIGH (ref 0.0–38.1)

## 2024-05-22 ENCOUNTER — Inpatient Hospital Stay

## 2024-05-22 ENCOUNTER — Other Ambulatory Visit: Payer: Self-pay | Admitting: Oncology

## 2024-05-22 ENCOUNTER — Other Ambulatory Visit: Payer: Self-pay

## 2024-05-22 VITALS — BP 152/84 | HR 96 | Temp 98.0°F | Resp 18

## 2024-05-22 DIAGNOSIS — D500 Iron deficiency anemia secondary to blood loss (chronic): Secondary | ICD-10-CM

## 2024-05-22 DIAGNOSIS — Z95828 Presence of other vascular implants and grafts: Secondary | ICD-10-CM

## 2024-05-22 DIAGNOSIS — D649 Anemia, unspecified: Secondary | ICD-10-CM

## 2024-05-22 LAB — PREPARE RBC (CROSSMATCH)

## 2024-05-22 LAB — MAGNESIUM: Magnesium: 2.1 mg/dL (ref 1.7–2.4)

## 2024-05-22 LAB — SAMPLE TO BLOOD BANK

## 2024-05-22 LAB — HEMOGLOBIN AND HEMATOCRIT, BLOOD
HCT: 23.9 % — ABNORMAL LOW (ref 36.0–46.0)
Hemoglobin: 7.6 g/dL — ABNORMAL LOW (ref 12.0–15.0)

## 2024-05-22 MED ORDER — ALTEPLASE 2 MG IJ SOLR
2.0000 mg | Freq: Once | INTRAMUSCULAR | Status: AC
  Administered 2024-05-22: 2 mg
  Filled 2024-05-22: qty 2

## 2024-05-22 MED ORDER — IRON SUCROSE 20 MG/ML IV SOLN
200.0000 mg | Freq: Once | INTRAVENOUS | Status: AC
  Administered 2024-05-22: 200 mg via INTRAVENOUS
  Filled 2024-05-22: qty 10

## 2024-05-22 MED ORDER — SODIUM CHLORIDE 0.9% FLUSH
10.0000 mL | Freq: Once | INTRAVENOUS | Status: AC | PRN
  Administered 2024-05-22: 10 mL
  Filled 2024-05-22: qty 10

## 2024-05-23 ENCOUNTER — Inpatient Hospital Stay

## 2024-05-23 DIAGNOSIS — D649 Anemia, unspecified: Secondary | ICD-10-CM

## 2024-05-23 DIAGNOSIS — D500 Iron deficiency anemia secondary to blood loss (chronic): Secondary | ICD-10-CM | POA: Diagnosis not present

## 2024-05-23 MED ORDER — HEPARIN SOD (PORK) LOCK FLUSH 100 UNIT/ML IV SOLN
250.0000 [IU] | INTRAVENOUS | Status: DC | PRN
  Filled 2024-05-23: qty 5

## 2024-05-23 MED ORDER — ACETAMINOPHEN 325 MG PO TABS
650.0000 mg | ORAL_TABLET | Freq: Once | ORAL | Status: AC
  Administered 2024-05-23: 650 mg via ORAL
  Filled 2024-05-23: qty 2

## 2024-05-23 MED ORDER — DIPHENHYDRAMINE HCL 25 MG PO TABS
25.0000 mg | ORAL_TABLET | Freq: Once | ORAL | Status: AC
  Administered 2024-05-23: 25 mg via ORAL
  Filled 2024-05-23: qty 1

## 2024-05-23 MED ORDER — HEPARIN SOD (PORK) LOCK FLUSH 100 UNIT/ML IV SOLN
500.0000 [IU] | Freq: Every day | INTRAVENOUS | Status: DC | PRN
  Filled 2024-05-23: qty 5

## 2024-05-23 MED ORDER — SODIUM CHLORIDE 0.9% FLUSH
3.0000 mL | INTRAVENOUS | Status: DC | PRN
  Filled 2024-05-23: qty 3

## 2024-05-23 MED ORDER — SODIUM CHLORIDE 0.9% IV SOLUTION
250.0000 mL | INTRAVENOUS | Status: DC
  Administered 2024-05-23: 100 mL via INTRAVENOUS
  Filled 2024-05-23: qty 250

## 2024-05-23 MED ORDER — SODIUM CHLORIDE 0.9% FLUSH
10.0000 mL | INTRAVENOUS | Status: DC | PRN
  Filled 2024-05-23: qty 10

## 2024-05-23 NOTE — Patient Instructions (Signed)

## 2024-05-23 NOTE — Progress Notes (Signed)
 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 And anemia. Considering further treatments.  Past medical history of GERD, HTN, MS, RA.  Met with patient during blood transfusion.  Reports that her appetite is poor. Tried the ensure complete and as able to drink it.  Drank one this am and has had some soup.  Reports that she tries to eat small amounts during the day.  Had bowel movement this am but sometimes does not have a bowel movement for few days and gets full quickly.    Noted admission with GI bleed.      Medications: prilosec, compazine , dexamethasone , remeron , MVI, miralax , vit D   Labs: reviewed   Anthropometrics:   Height: 69 inches Weight: 148 lb 3. 2oz 180 lb 12/13/23 BMI: 21  18% weight loss in the last 5 months  NUTRITION DIAGNOSIS: Inadequate oral intake related to cancer as evidenced by 18% weight loss and poor po intake    INTERVENTION:  Encouraged small frequent meals Encouraged bowel regimen to decrease risk of constipation Continue appetite stimulants Samples given of boost V[employer] and [employer] 1.4 for higher calories Contact information provided. Patient prefers to call RD if needed for follow-up    Next Visit: no follow-up Patient prefers phone call  Tyhir Schwan B. Dasie SOLON, CSO, LDN Registered Dietitian [phone]

## 2024-05-24 LAB — BPAM RBC
Blood Product Expiration Date: 202602072359
ISSUE DATE / TIME: 202601161333
Unit Type and Rh: 5100

## 2024-05-24 LAB — TYPE AND SCREEN
ABO/RH(D): O POS
Antibody Screen: NEGATIVE
Unit division: 0

## 2024-05-26 ENCOUNTER — Inpatient Hospital Stay

## 2024-05-26 ENCOUNTER — Telehealth: Payer: Self-pay | Admitting: *Deleted

## 2024-05-26 DIAGNOSIS — C563 Malignant neoplasm of bilateral ovaries: Secondary | ICD-10-CM

## 2024-05-26 NOTE — Telephone Encounter (Signed)
 " Caller verified using pt's full name and dob prior to discussing PHI      NURSING SYMPTOM TRIAGE ASSESSMENT & DISPOSITION  Mode of interaction:  Telephone Date of symptom triage interaction:  05/26/24 Time of symptom triage interaction:  1055 am  Cancer diagnosis:  Malignant neoplasm of both ovaries (HCC)  Last treatment date:  currently not on any chemo. But on IV iron  infusions at this time. Oral chemotherapy:  No   Symptom Triage Protocol:  Pain  History of the problem:  Pain Pain Rating (0-10): Currently 8/10, on average 7/10, at worst 10/10, at best 10/10  Pain Location: Abdominal pain; right side upper abd.  Pain Quality: Sharp  Associated Pain Symptoms: None  When Did The Pain Start: Acute abdominal pain with cancer associated pain  Is the Pain Constant: Yes  Does the Pain Flare:   No  Does Anything Make the Pain Worse: Yes, generalized movement  Does Anything Make the Pain Better:  Yes, oxycodone  when I take it.     Assess for Changes in ADLs: none     In the past, what treatments have you tried for your pain: Patient reports that she is out of the oxycodone  20 mg. She is not due for a RF for a few more days. Patient has not let her neurologist, Camie Code, know that she is out of her pain meds. Camie is the prescriber.  She has plenty of the Xtampza  available on hand per patient. Patient is requesting a rf on the oxycodone  if possible to manage her pain. She is hopeful that once she can get the embolization procedure on duodenal mass performed in IR that her pain will improve. She meets with the IR dept tomorrow to discuss.  No nausea, vomiting or diarrhea. Denies fever, shortness or chest pain.Denies any trauma or acute falls   Last bm-today; regular. Did not strain to have BM. She is passing gas     Did these treatments help: Yes  Additional commentary: Pain is worse at night. ; patient has h/o of GI bleed    Triage Nurse Guidance Severity Index  Seek  emergency care, call an ambulance immediately for: Signs/symptoms of acute injury, spinal cord compression, pathologic fracture, infection, or other life threatening problem Sudden onset of severe weakness or unrelenting localized pain Inability to ambulate or decreased sensation in extremities Loss of control of bowel or bladder Chest pain  Seek medical care within two to four hours for: Sudden onset of moderate to severe pain Pain not responsive to current medication regimen Pain that interferes with mobility  Seek medical care within twenty-four hours for: Mild to moderate pain that has been increasing Pain that is interfering with activity or sleep  Follow home care instructions for: Mild to moderate aches and pains Notify the provider if no improvement within twenty-four hours    Provider Consulted  Provider name and credentials: Msg sent to Dr. Babara and Sidra Mower, NP  Provider instruction: Inperson apt with Josh for pain mgmt.    Patient Instruction  Disclaimer:  Patient specific and [employer] instruction provided verbally and sent through MyChart for active users.    Report the following problems No improvement in pain Pain that does not subside with interventions Other side effects, such as sedation, nausea, or constipation  Seek emergency care immediately if any of the following occur: Excruciating pain Immobility Low back pain associated with loss of bladder or bowel control; bilateral extremity weakness  Teach Back Method used:  Yes     Nurse Triage Priority:  Urgent (obtain medical orders as indicated with instruction for 8-24 hour or sooner follow-up)   Barriers to Care:  See [facility] Flowsheet--anxiety; social connections   Nurse Triage Disposition:  In-person follow-up visit:  for Palliative care to discuss pain mgmt. Patient called back with appointments for 930 am with Josh on 05/27/24  Protocol Source:  Ruthine HERO., & Eldonna CANDIE Pee.). (2019). Telephone  triage for oncology nurses (3rd ed.). Oncology Nursing Society.  "

## 2024-05-26 NOTE — Telephone Encounter (Signed)
 Patient accepted inperson apt tomorrow at 930 am. She has an apt with [facility] for discuss on embolization of bleed on duodenal mass. She would like to come after this apt. Pt aware that we are unable to fill her meds w/o a virtual or in-person visit.

## 2024-05-27 ENCOUNTER — Inpatient Hospital Stay

## 2024-05-27 ENCOUNTER — Encounter: Payer: Self-pay | Admitting: Oncology

## 2024-05-27 ENCOUNTER — Inpatient Hospital Stay: Admitting: Hospice and Palliative Medicine

## 2024-05-27 ENCOUNTER — Other Ambulatory Visit: Payer: Self-pay

## 2024-05-27 ENCOUNTER — Ambulatory Visit
Admission: RE | Admit: 2024-05-27 | Discharge: 2024-05-27 | Disposition: A | Source: Ambulatory Visit | Attending: Oncology

## 2024-05-27 ENCOUNTER — Telehealth: Payer: Self-pay | Admitting: Oncology

## 2024-05-27 ENCOUNTER — Telehealth: Payer: Self-pay | Admitting: *Deleted

## 2024-05-27 VITALS — BP 112/73 | HR 103 | Temp 98.6°F | Resp 24 | Wt 148.2 lb

## 2024-05-27 DIAGNOSIS — D649 Anemia, unspecified: Secondary | ICD-10-CM

## 2024-05-27 DIAGNOSIS — G893 Neoplasm related pain (acute) (chronic): Secondary | ICD-10-CM

## 2024-05-27 DIAGNOSIS — C563 Malignant neoplasm of bilateral ovaries: Secondary | ICD-10-CM

## 2024-05-27 DIAGNOSIS — K921 Melena: Secondary | ICD-10-CM

## 2024-05-27 DIAGNOSIS — D500 Iron deficiency anemia secondary to blood loss (chronic): Secondary | ICD-10-CM | POA: Diagnosis not present

## 2024-05-27 DIAGNOSIS — K3189 Other diseases of stomach and duodenum: Secondary | ICD-10-CM

## 2024-05-27 HISTORY — PX: IR RADIOLOGIST EVAL & MGMT: IMG5224

## 2024-05-27 LAB — HEMOGLOBIN AND HEMATOCRIT (CANCER CENTER ONLY)
HCT: 25.6 % — ABNORMAL LOW (ref 36.0–46.0)
Hemoglobin: 8.2 g/dL — ABNORMAL LOW (ref 12.0–15.0)

## 2024-05-27 LAB — SAMPLE TO BLOOD BANK

## 2024-05-27 MED ORDER — OXYCODONE HCL 5 MG PO TABS
20.0000 mg | ORAL_TABLET | Freq: Once | ORAL | Status: AC
  Administered 2024-05-27: 20 mg via ORAL
  Filled 2024-05-27: qty 4

## 2024-05-27 NOTE — Telephone Encounter (Signed)
 Called pt to make aware of canceled port flush lab for 1/21 - LH

## 2024-05-27 NOTE — Telephone Encounter (Signed)
 Called patient via request per Sidra Mower, NP with her lab results.  Hgb is 8.2.  Informed her she does not need a blood transfusion, but reminded her to return tomorrow for appointment for Venofer .  She is agreeable.

## 2024-05-27 NOTE — Progress Notes (Signed)
 "    Palliative Medicine [facility] at [facility] [phone] [phone]   Name: [name] Date: 05/27/2024 MRN: [id number]  DOB: March 11, 1956  Patient Care Team: Arloa Elsie SAUNDERS, MD as PCP - General (Family Medicine) [name] (Physician Assistant) Maurie Rayfield BIRCH, RN as Oncology Nurse Navigator Babara Call, MD as Consulting Physician (Oncology)    REASON FOR CONSULTATION: [name] is a 69 y.o. female with multiple medical problems including RA, MS, and metastatic ovarian cancer unresponsive to previous treatment.  Patient was hospitalized in November and December 2025 with GI bleed.  She had EGD with findings of a bleeding mass, invading the small bowel.  Patient has required frequent transfusions. She was referred to palliative care to discuss goals.   SOCIAL HISTORY:     reports that she has been smoking cigarettes. She started smoking about 30 years ago. She has a 2.7 pack-year smoking history. She has never used smokeless tobacco. She reports that she does not currently use alcohol. She reports current drug use. Drug: Oxycodone .  Patient is married.  Lives at home alone.  Has a daughter in [place].  Has a sister who is involved.  She recently retired from [employer] where she worked in [employer].  ADVANCE DIRECTIVES:  On file  CODE STATUS: Full code  PAST MEDICAL HISTORY: Past Medical History:  Diagnosis Date   Anxiety    Asthma    Back problem    disc disease, chronic low back pain, right leg pain, s/p discectomy 99   Cancer (HCC)    ovarian cancer   Depression    Diastolic dysfunction    with elevated LVEDP at cath   Endometriosis    Fibroids    hx of    GERD (gastroesophageal reflux disease)    Heart murmur    Hepatitis C    treated 8-9 years ago   Hidradenitis suppurativa    History of kidney stones    Hypertension    MS (multiple sclerosis)    Neuromuscular  disorder (HCC)    Mulitple sclerosis   Osteoarthritis    Pre-diabetes    Prediabetes    RA (rheumatoid arthritis) (HCC)    Rotator cuff tear    right shoulder    Syncope     PAST SURGICAL HISTORY:  Past Surgical History:  Procedure Laterality Date   ANGIOPLASTY     BACK SURGERY     lumbar surgery x 2 done in [place]  and [place]   COLON RESECTION SIGMOID  08/16/2022   Procedure: COLON RESECTION SIGMOID;  Surgeon: Viktoria Comer SAUNDERS, MD;  Location: [facility];  Service: Gynecology;;   COLONOSCOPY  07/2010   tubular adenoma   COLONOSCOPY  08/2015   CYSTOSCOPY N/A 08/16/2022   Procedure: CYSTOSCOPY;  Surgeon: Viktoria Comer SAUNDERS, MD;  Location: [facility];  Service: Gynecology;  Laterality: N/A;   DISKECTOMY     ESOPHAGOGASTRODUODENOSCOPY N/A 03/30/2024   Procedure: EGD (ESOPHAGOGASTRODUODENOSCOPY);  Surgeon: Maryruth Ole DASEN, MD;  Location: [facility];  Service: Endoscopy;  Laterality: N/A;   HYSTERECTOMY ABDOMINAL WITH SALPINGO-OOPHORECTOMY  08/16/2022   Procedure: HYSTERECTOMY ABDOMINAL WITH SALPINGO-OOPHORECTOMY;  Surgeon: Viktoria Comer SAUNDERS, MD;  Location: [facility];  Service: Gynecology;;   IR IMAGING GUIDED PORT INSERTION  05/18/2022   IR IMAGING GUIDED PORT INSERTION  07/03/2023   IR RADIOLOGIST EVAL & MGMT  05/27/2024   IR REMOVAL TUN ACCESS W/ PORT W/O FL MOD SED  12/20/2022   IR US  GUIDE BX ASP/DRAIN  05/18/2022   LAPAROSCOPY N/A 08/16/2022   Procedure: LAPAROSCOPY DIAGNOSTIC;  Surgeon: Viktoria Comer SAUNDERS, MD;  Location: [facility];  Service: Gynecology;  Laterality: N/A;   laparoscopy for endometriosis     left shoulder scope     RADIOLOGY WITH ANESTHESIA N/A 05/18/2022   Procedure: IR WITH ANESTHESIA PORT AND BIOPSY;  Surgeon: Luverne Aran, MD;  Location: [facility];  Service: Radiology;  Laterality: N/A;   ROTATOR CUFF REPAIR Left    Spinal Fusion  12/2016   L4-S1    HEMATOLOGY/ONCOLOGY HISTORY:  Oncology History Overview Note  High grade serous, p53 mutated Neg  genetics, HRD not detected, MSI stable, low TMB 2, progressed on carboplatin  and gemcitabine , paclitaxel    Malignant neoplasm of both ovaries (HCC)  04/11/2022 Imaging   CT of the abdomen and pelvis on 04/11/2022 reveals a left adnexal masslike area measuring 5.9 x 3.6 cm.  Margins of this mass are irregular.  There is small fluid in the cul-de-sac as well as fullness of soft tissue about the right adnexa.  Discrete uterine structure is not identified.  Left upper quadrant nodules beneath the left hemidiaphragm (1 anterior to the spleen and the other to the stomach).  Omental/peritoneal nodularity and multiple perihepatic nodules are noted    04/18/2022 Tumor Marker   Patient's tumor was tested for the following markers: CA-125. Results of the tumor marker test revealed 123.   04/19/2022 Initial Diagnosis   Carcinomatosis (HCC)   04/28/2022 Imaging   1. Bulky bilateral ovarian masses and nodules, left-greater-than-right, which are essentially confluent with adjacent pelvic soft tissue nodularity and not significantly changed compared to prior examination dated 04/11/2022. 2. Extensive pelvic peritoneal thickening and nodularity. Multiple small peritoneal nodules throughout the abdomen and pelvis. Findings are consistent with peritoneal metastatic disease and likewise not significantly changed. 3. Small volume of loculated appearing fluid in the low pelvis. 4. 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 The mid to distal right ureter is obstructed by right ovarian mass and or soft tissue nodularity. 5. Calculus within the most distal left ureterovesicular junction or just within the bladder lumen measuring 0.7 cm, unchanged. No associated left-sided hydronephrosis. 6. Soft tissue attenuation nodule of the body of the right adrenal gland. Notably, this was also soft tissue attenuation on prior  noncontrast examination (i.e. not definitively macroscopic fat containing) although unchanged compared to prior examinations dating back to 2018 and almost certainly a benign adenoma. Attention on follow-up. 7. No evidence of metastatic disease in the chest. 8. Mild diffuse bilateral bronchial wall thickening. Background of very fine centrilobular nodularity, most concentrated in the lung apices. Findings are most consistent with smoking-related respiratory bronchiolitis.     05/18/2022 Procedure   Ultrasound-guided core biopsy performed of a 10 mm soft tissue peritoneal mass in the right upper quadrant just superficial to the right lobe of the liver. The procedure was performed under general anesthesia immediately following Port-A-Cath placement.   05/18/2022 Procedure   Placement of single lumen port a cath via right internal jugular vein. The catheter tip lies at the cavo-atrial junction. A power injectable port a cath was placed and is ready for immediate use.     05/18/2022 Pathology Results   A. PERITONEAL MASS, RIGHT, BIOPSY:  - Metastatic high grade serous carcinoma (see comment)   COMMENT:   Appropriately controlled immunohistochemical stains reveal tumor cells are positive for  PAX8, WT1 and p53.  The findings support the above interpretation.  This case was reviewed with Dr. Rebbecca who agrees with the above diagnosis.  A p16 stain is pending and will be reported in an addendum.    05/23/2022 Cancer Staging   Staging form: Ovary, Fallopian Tube, and Primary Peritoneal Carcinoma, AJCC 8th Edition - Clinical stage from 05/23/2022: FIGO Stage IIIC (cT3c, cN0, cM0) - Signed by Lonn Hicks, MD on 05/23/2022 Stage prefix: Initial diagnosis   05/29/2022 Imaging   1. Complex bilateral adnexal masses, as described above, consistent with the patient's known malignancy. 2. Subsequent marked severity mass effect on the distal bilateral common iliac veins, increased in severity when compared to the  prior exam. 3. Additional findings consistent with peritoneal metastasis within the pelvis. 4. Findings consistent with pelvic congestion syndrome. 5. Postoperative changes within the mid and lower lumbar spine. 6. Aortic atherosclerosis. 7. No evidence of venous thrombus within the abdomen, pelvis or proximal bilateral lower extremities.   Aortic Atherosclerosis (ICD10-I70.0).   06/02/2022 - 11/03/2022 Chemotherapy   Patient is on Treatment Plan : OVARIAN Carboplatin  (AUC 6) + Paclitaxel  (175) q21d X 6 Cycles     06/26/2022 Tumor Marker   Patient's tumor was tested for the following markers: CA-125. Results of the tumor marker test revealed 134.   08/04/2022 Imaging   CT ABDOMEN PELVIS W CONTRAST  Result Date: 08/04/2022 CLINICAL DATA:  Ovarian cancer, chemotherapy in progress, for restaging EXAM: CT ABDOMEN AND PELVIS WITH CONTRAST TECHNIQUE: Multidetector CT imaging of the abdomen and pelvis was performed using the standard protocol following bolus administration of intravenous contrast. RADIATION DOSE REDUCTION: This exam was performed according to the departmental dose-optimization program which includes automated exposure control, adjustment of the mA and/or kV according to patient size and/or use of iterative reconstruction technique. CONTRAST:  OMNIPAQUE  IOHEXOL  300 MG/ML  SOLN COMPARISON:  CT venogram abdomen/pelvis dated 05/29/2022. CT abdomen/pelvis dated 04/26/2022. FINDINGS: Lower chest: Lung bases are clear. Hepatobiliary: Liver is within normal limits. Gallbladder is unremarkable. No intrahepatic or extrahepatic ductal dilatation. Pancreas: Within normal limits. Spleen: Within normal limits. Adrenals/Urinary Tract: 2.4 cm right adrenal nodule (series 2/image 17), previously 2.0 cm in 2018, likely reflecting a benign adrenal adenoma. Left adrenal gland is within normal limits. Left kidney is within normal limits. Right kidney is notable for mild hydronephrosis with moderate  hydroureteronephrosis and duplicated ureters (series 2/image 38), chronic. Associated extrinsic compression at the level of the right adnexal mass (described below). Mildly thick-walled bladder, although underdistended. Stable 6 mm left UVJ calcification (series 2/image 68). Stomach/Bowel: Stomach is within normal limits. No evidence of bowel obstruction. Normal appendix (series 2/image 47). Left adnexal mass (described below) is favored to abut and directly invade the left lateral wall of the sigmoid colon (series 2/image 57). Vascular/Lymphatic: No evidence of abdominal aortic aneurysm. Atherosclerotic calcifications of the abdominal aorta and branch vessels. No suspicious abdominopelvic lymphadenopathy. Reproductive: Heterogeneous uterus with associated surface nodularity (series 2/image 65), likely reflecting peritoneal disease. This appearance is improved from the prior. 3.9 x 3.3 cm left adnexal mass (series 2/image 60), previously 5.3 x 3.9 cm, with extension along the sigmoid mesocolon (series 2/image 56) and suspected involvement of the sigmoid colon (series 2/image 57). 5.6 x 2.7 cm mixed cystic/solid right adnexal mass (series 2/image 89), previously 6.9 x 4.3 cm when measured in a similar fashion. Other: No abdominopelvic ascites. Scattered minimal peritoneal nodularity beneath the anterior abdominal wall, including a 5 mm nodule beneath the left anterior  abdominal wall (series 2/image 27) which previously measured 12 mm. Additional mild nodularity on series 2/images 32, 33, and 50. Musculoskeletal: Status post PLIF at L4-S1. Status post lateral fixation at L4-5. Status post anterior fixation at L5-S1. Mild degenerative changes of the lower thoracic spine. IMPRESSION: Improving bilateral adnexa masses in this patient with known ovarian cancer, as above. Suspected involvement of the sigmoid colon. No evidence of bowel obstruction. Improving peritoneal nodularity, as above. Additional ancillary findings as  above. Electronically Signed   By: Pinkie Pebbles M.D.   On: 08/04/2022 01:27      08/16/2022 Pathology Results   CASE: [id number] PATIENT: [name] Surgical Pathology Report  Clinical History: Advanced gyn malignancy (crm)  FINAL MICROSCOPIC DIAGNOSIS:  A. OMENTUM, RESECTION: - Positive for carcinoma  B. LIVER ADHESION, EXCISION: - Benign fibroadipose tissue - Negative for carcinoma  C. SMALL BOWEL MESENTERIC IMPLANT, EXCISION: - Positive for carcinoma with extensive necrosis  D. UTERUS, CERVIX, BILATERAL FALLOPIAN TUBES AND OVARIES, AND APPENDIX, RESECTION: - Uterine serosa: Tumor deposits present - Cervix: Benign, nabothian cyst - Endometrium: Benign inactive endometrium, benign endometrial polyp - Myometrium: Leiomyoma - Right fallopian tube: Metastatic tumor deposits present - Right ovary: High-grade serous carcinoma - Left ovary: High-grade serous carcinoma - Benign appendix - See oncology table  E. COLON, RECTOSIGMOID, RESECTION: - Benign colonic mucosa - Margins free of carcinoma - Adherent fallopian tube with metastatic tumor deposits - 2 benign lymph nodes  F. RECTAL TUMOR IMPLANT, EXCISION: - Metastatic high-grade serous carcinoma   G. COLON, RECTOSIGMOID ANASTOMOTIC RINGS: - Benign anastomotic donuts  ONCOLOGY TABLE:   OVARY or FALLOPIAN TUBE or PRIMARY PERITONEUM: Resection  Procedure: Total abdominal hysterectomy, bilateral salpingo-oophorectomy, appendectomy, rectosigmoid resection, total omentectomy, small bowel nodule resection Specimen Integrity: Intact Tumor Site: Bilateral Tumor Size: Right ovary: 3.9 cm, left ovary: Approximately 1.2 cm Histologic Type: High-grade serous carcinoma Histologic Grade: High-grade Ovarian Surface Involvement: Present Fallopian Tube Surface Involvement: Present Implants: Present, small bowel mesenteric and rectovaginal septum Lymphatic and/or Vascular Invasion: Not identified Other Tissue/ Organ  Involvement: Fallopian tube, bilateral Largest Extrapelvic Peritoneal Focus: 1.4 cm, small bowel mesentery Peritoneal/Ascitic Fluid Involvement: Not applicable Chemotherapy Response Score (CRS): Not applicable, no known presurgical therapy Regional Lymph Nodes: Not applicable (no lymph nodes submitted or found) Distant Metastasis:      Distant Site(s) Involved: Not applicable Pathologic Stage Classification (pTNM, AJCC 8th Edition): pT3b, pN[not assigned] Ancillary Studies: Can be performed upon request Representative Tumor Block: D8 Comment(s): Tumor is staged as pT3 given the presence of extrapelvic peritoneal deposits on small bowel mesentery less than 2cm (1.4 cm) ([id number])    09/18/2022 Tumor Marker   Patient's tumor was tested for the following markers: CA-125. Results of the tumor marker test revealed 36.8.   10/15/2022 Genetic Testing   Negative [facility] Multi-Cancer +RNA Panel.  VUS in POLD1 at c.1322C>T (p.Thr441Met). Report date is 10/15/2022.   The Multi-Cancer + RNA Panel offered by [facility] includes sequencing and/or deletion/duplication analysis of the following 70 genes:  AIP*, ALK, APC*, ATM*, AXIN2*, BAP1*, BARD1*, BLM*, BMPR1A*, BRCA1*, BRCA2*, BRIP1*, CDC73*, CDH1*, CDK4, CDKN1B*, CDKN2A, CHEK2*, CTNNA1*, DICER1*, EPCAM (del/dup only), EGFR, FH*, FLCN*, GREM1 (promoter dup only), HOXB13, KIT, LZTR1, MAX*, MBD4, MEN1*, MET, MITF, MLH1*, MSH2*, MSH3*, MSH6*, MUTYH*, NF1*, NF2*, NTHL1*, PALB2*, PDGFRA, PMS2*, POLD1*, POLE*, POT1*, PRKAR1A*, PTCH1*, PTEN*, RAD51C*, RAD51D*, RB1*, RET, SDHA* (sequencing only), SDHAF2*, SDHB*, SDHC*, SDHD*, SMAD4*, SMARCA4*, SMARCB1*, SMARCE1*, STK11*, SUFU*, TMEM127*, TP53*, TSC1*, TSC2*, VHL*. RNA analysis is performed for * genes.  11/06/2022 Tumor Marker   Patient's tumor was tested for the following markers: CA-125. Results of the tumor marker test revealed 16.8.   12/11/2022 Tumor Marker   Patient's tumor was tested for the following markers:  CA-125. Results of the tumor marker test revealed 12.5.   12/14/2022 Imaging   CT ABDOMEN PELVIS W CONTRAST  Result Date: 12/13/2022 CLINICAL DATA:  Ovarian cancer restaging * Tracking Code: BO * EXAM: CT ABDOMEN AND PELVIS WITH CONTRAST TECHNIQUE: Multidetector CT imaging of the abdomen and pelvis was performed using the standard protocol following bolus administration of intravenous contrast. RADIATION DOSE REDUCTION: This exam was performed according to the departmental dose-optimization program which includes automated exposure control, adjustment of the mA and/or kV according to patient size and/or use of iterative reconstruction technique. CONTRAST:  OMNIPAQUE  IOHEXOL  300 MG/ML  SOLN COMPARISON:  08/02/2022 FINDINGS: Lower chest: Unremarkable Hepatobiliary: Unremarkable Pancreas: Unremarkable Spleen: Unremarkable Adrenals/Urinary Tract: 2.6 by 2.1 cm right adrenal mass, internal density 55 Hounsfield units on portal venous phase images, relative washout of 13% which is indeterminate. Back in 2016 I measure this lesion at 2.2 by 1.6 cm, on the more recent comparison of 07/18/2022 this lesion measured 2.6 by 2.1 cm similar to today. Accordingly this lesion is stable from March and only very minimally increased in size from 2016, most likely benign. This can likely be surveilled in the context of the patient's ongoing oncology CT examinations. 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 The ureters transition to relatively normal caliber in the vicinity of the iliac vessel cross over, a specific cause for the transition is not identified. There is also a duplicated left renal collecting system without hydroureter on the left Stable 7 mm calcification or stone in the vicinity of the left ureterovesical junction. Stomach/Bowel: Prominent stool throughout the colon favors constipation.  Anastomotic staple line in the sigmoid colon. No dilated small bowel. Interval appendectomy. Previous nodularity along bowel including the sigmoid colon no longer identified. Vascular/Lymphatic: Atherosclerosis is present, including aortoiliac atherosclerotic disease. No pathologic adenopathy. Reproductive: Interval hysterectomy with salpingo-oophorectomy. Other: Interval omentectomy. No current compelling findings of peritoneal tumor deposits. No ascites. Musculoskeletal: Prior fusion at L4-L5-S1. IMPRESSION: 1. Interval hysterectomy, omentectomy, and appendectomy. No compelling findings of recurrent or residual peritoneal tumor deposits. 2. 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 A specific cause for the transition in caliber is not identified. 3. Stable 7 mm calcification or stone in the vicinity of the left ureterovesical junction. Duplicated left renal collecting system. 4. Prominent stool throughout the colon favors constipation. 5. Chronically stable but otherwise nonspecific right adrenal mass, likely benign. 6. Aortic atherosclerosis. Aortic Atherosclerosis (ICD10-I70.0). Electronically Signed   By: Ryan Salvage M.D.   On: 12/13/2022 15:16      12/20/2022 Procedure   Successful removal of a RIGHT chest implanted Port-A-Cath.    03/19/2023 Tumor Marker   Patient's tumor was tested for the following markers: CA-125. Results of the tumor marker test revealed 19.7.   06/14/2023 Imaging   CT ABDOMEN PELVIS W CONTRAST Result Date: 06/21/2023 CLINICAL DATA:  Restaging ovarian cancer.  * Tracking Code: BO * EXAM: CT ABDOMEN AND PELVIS WITH CONTRAST TECHNIQUE: Multidetector CT imaging of the abdomen and pelvis was performed using the standard protocol following bolus administration of  intravenous contrast. RADIATION DOSE REDUCTION: This exam was  performed according to the departmental dose-optimization program which includes automated exposure control, adjustment of the mA and/or kV according to patient size and/or use of iterative reconstruction technique. CONTRAST:  OMNIPAQUE  IOHEXOL  300 MG/ML  SOLN COMPARISON:  CT abdomen and pelvis dated 12/08/2022 FINDINGS: Lower chest: No focal consolidation or pulmonary nodule in the lung bases. No pleural effusion or pneumothorax demonstrated. Partially imaged heart size is normal. Hepatobiliary: No focal hepatic lesions. No intra or extrahepatic biliary ductal dilation. Normal gallbladder. Pancreas: No focal lesions or main ductal dilation. Spleen: Normal in size without focal abnormality. Adrenals/Urinary Tract: Unchanged 2.0 cm right adrenal nodule measuring 43 HU (2:16). No left adrenal nodule. 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 Duplex left kidney. No hydronephrosis. Unchanged 7 mm calcification adjacent to the left ureterovesical junction. No focal bladder wall thickening. Stomach/Bowel: Normal appearance of the stomach. Postsurgical changes from rectosigmoid resection with patent anastomosis. No evidence of bowel wall thickening, distention, or inflammatory changes. Appendectomy. Vascular/Lymphatic: Aortic atherosclerosis. New and increased size of multi station lymphadenopathy and peritoneal nodules, for example: -12 mm cardiophrenic (2:2), previously partially imaged at 3 mm -new 12 mm midline epigastric anterior peritoneal (2:23) -new 3.1 x 2.9 cm peripancreatic (2:25) -14 mm right pelvic sidewall (2:56), previously 4 mm -11 mm right external iliac (2:60), previously 7 mm Reproductive: Status post hysterectomy and bilateral salpingo-oophorectomy. No adnexal masses. Increased lobulated soft tissue density adjacent to the right vaginal cuff measuring 2.9 x 1.8 cm (2:64), previously 2.4 x 1.7 cm Other: Status post total omentectomy. No free fluid, fluid collection, or free air.  Musculoskeletal: No acute or abnormal lytic or blastic osseous lesions. Postsurgical changes of L4-S1 spinal fixation. Hardware appears intact. Postsurgical changes of the anterior abdominal wall. IMPRESSION: 1. New and increased size of multi station lymphadenopathy and peritoneal nodules, consistent with metastatic disease. 2. Increased lobulated soft tissue density adjacent to the right vaginal cuff measuring 2.9 x 1.8 cm, previously 2.4 x 1.7 cm, suspicious for recurrent disease. 3. Unchanged 2.0 cm right adrenal nodule, favored benign. Recommend continued attention on follow-up. 4. 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 Duplex left kidney. No hydronephrosis. Unchanged 7 mm calcification adjacent to the left ureterovesical junction. 5.  Aortic Atherosclerosis (ICD10-I70.0). Electronically Signed   By: Limin  Xu M.D.   On: 06/21/2023 09:35      06/18/2023 Tumor Marker   Patient's tumor was tested for the following markers: CA-125. Results of the tumor marker test revealed 32.9.   07/03/2023 Procedure   Successful placement of a power injectable Port-A-Cath via the right internal jugular vein. The catheter is ready for immediate use.   07/04/2023 - 08/31/2023 Chemotherapy   Patient is on Treatment Plan : OVARIAN RECURRENT 3RD LINE Bevacizumab  D1 + Carboplatin  D1 + Gemcitabine  D1,8 (4/800) q21d     07/13/2023 Tumor Marker   Patient's tumor was tested for the following markers: CA-125. Results of the tumor marker test revealed 33.2.   08/17/2023 Tumor Marker   Patient's tumor was tested for the following markers: CA-125. Results of the tumor marker test revealed 16.1.   08/30/2023 Imaging   1. Status post hysterectomy and bilateral salpingo-oophorectomy. 2. Multistation adenopathy and peritoneal nodularity is again noted, compatible with metastatic disease. Mild increased size of peripancreatic node. Upper abdominal peritoneal nodule is slightly decreased in size in the  interval. Additional index lymph nodes are stable in the interval. 3. Increased soft tissue density adjacent to the vaginal  cuff, concerning for locally recurrent disease. 4. Duplex right kidney with mild hydronephrosis and hydroureter, unchanged. Stone within the left UVJ is again noted measuring 0.8 x 0.4 cm, unchanged from previous exam. 5. Stable right adrenal nodule.  None 6. Supraumbilical ventral abdominal wall hernia containing a nonobstructed loop of small bowel. 7.  Aortic Atherosclerosis (ICD10-I70.0).       09/27/2023 - 11/15/2023 Chemotherapy   Patient is on Treatment Plan : OVARIAN Paclitaxel  (80) I8,1,84,77 q28d     09/28/2023 Tumor Marker   Patient's tumor was tested for the following markers: CA-125. Results of the tumor marker test revealed 29.8.   11/05/2023 Tumor Marker   Patient's tumor was tested for the following markers: CA-125. Results of the tumor marker test revealed 40.   11/16/2023 Tumor Marker   Patient's tumor was tested for the following markers: CA-125. Results of the tumor marker test revealed 32.3.   11/22/2023 Imaging   CT ABDOMEN PELVIS W CONTRAST Result Date: 11/28/2023 CLINICAL DATA:  Ovarian cancer.  * Tracking Code: BO * EXAM: CT ABDOMEN AND PELVIS WITH CONTRAST TECHNIQUE: Multidetector CT imaging of the abdomen and pelvis was performed using the standard protocol following bolus administration of intravenous contrast. RADIATION DOSE REDUCTION: This exam was performed according to the departmental dose-optimization program which includes automated exposure control, adjustment of the mA and/or kV according to patient size and/or use of iterative reconstruction technique. CONTRAST:  OMNIPAQUE  IOHEXOL  300 MG/ML  SOLN COMPARISON:  08/30/2023. FINDINGS: Lower chest: Lung bases are clear. Prepericardiac lymph node measures 1.6 cm, enlarged from 1.1 cm on 08/30/2023. Heart is at the upper limits of normal in size. No pericardial or pleural effusion.  Distal esophagus is grossly unremarkable. Hepatobiliary: Liver and gallbladder are unremarkable. No biliary ductal dilatation. Pancreas: Negative. Spleen: Negative. Adrenals/Urinary Tract: 2.4 cm right adrenal nodule, chronically stable and characterized previously 2 as an adenoma. No specific follow-up necessary. Minimal nodular thickening of the left adrenal gland, unchanged. Minimal scarring in the right kidney. Kidneys are otherwise unremarkable. Difficulty did right ureter appears to join in the midportion. Right ureter is decompressed. Left ureter may be minimally dilated secondary to an 8 mm stone at the left ureteral orifice, unchanged from 08/30/2023. Bladder is otherwise grossly unremarkable. Stomach/Bowel: Stomach, small bowel and colon are unremarkable. Appendix is not readily visualized. Vascular/Lymphatic: Atherosclerotic calcification of the aorta. Heterogeneous nodal mass or peritoneal implant between the distal stomach and pancreas measures 3.7 x 5.3 cm (2/29), enlarged from 3.0 x 3.7 cm. Right external iliac lymph node measures 7 mm (2/58), previously 12 mm. A second right external iliac lymph node measures 7 mm (2/64), stable. Reproductive: Hysterectomy. Similar asymmetric nodular soft tissue along the right vaginal cuff (2/66-67). No adnexal mass. Other: Previously seen ventral midline epigastric peritoneal nodule is no longer visualized. No free fluid. Small incisional hernia contains fat along the ventral supraumbilical midline. Musculoskeletal: Osteopenia. Degenerative changes in the spine. L5-S1 fusion. IMPRESSION: 1. Enlarging prepericardiac nodal metastasis. Enlarging nodal mass or peritoneal implant between the distal stomach and pancreas. 2. Resolved ventral midline epigastric peritoneal metastasis. 3. Right external iliac lymph nodes are stable to smaller in size. 4. Asymmetric nodular thickening of the right vaginal cuff, stable. 5. Trace left ureteral dilatation with a chronic 8 mm  stone at the left ureteral orifice. 6. Stable right adrenal nodule, previously characterized as an adenoma. 7.  Aortic atherosclerosis (ICD10-I70.0). Electronically Signed   By: Newell Eke M.D.   On: 11/28/2023 13:56  02/22/2024 Imaging   CT ABDOMEN PELVIS W CONTRAST Result Date: 02/24/2024 CLINICAL DATA:  Ovarian cancer, monitor, off chemotherapy. * Tracking Code: BO * EXAM: CT ABDOMEN AND PELVIS WITH CONTRAST TECHNIQUE: Multidetector CT imaging of the abdomen and pelvis was performed using the standard protocol following bolus administration of intravenous contrast. RADIATION DOSE REDUCTION: This exam was performed according to the departmental dose-optimization program which includes automated exposure control, adjustment of the mA and/or kV according to patient size and/or use of iterative reconstruction technique. CONTRAST:  OMNIPAQUE  IOHEXOL  300 MG/ML  SOLN COMPARISON:  Multiple priors including CT November 22, 2023 FINDINGS: Lower chest: No acute abnormality. Centrally necrotic pericardiophrenic lymph node measures 18 mm in short axis on image 3/2 previously 16 mm Hepatobiliary: No suspicious hepatic lesion. Gallbladder is unremarkable. No biliary ductal dilation. Pancreas: No pancreatic ductal dilation or evidence of acute inflammation. Spleen: No splenomegaly. Adrenals/Urinary Tract: 2.1 cm right adrenal nodule on image 20/2 stable over multiple prior examinations and previously characterized as a benign adenoma. Left adrenal gland appears normal. 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 Similar prominence of the left ureter with 8 mm stone at the UVJ. Stomach/Bowel: Stomach is minimally distended limiting evaluation. No pathologic dilation of small or large bowel. No evidence of acute bowel inflammation. Vascular/Lymphatic: Normal caliber abdominal aorta. Smooth IVC contours. Narrowing of the portal vein and portal confluence by a  heterogeneous enlarged lymph node/mass which measures 7.4 x 6.5 cm on image 27/2 previously 4.7 x 4.1 cm when remeasured for consistency, this sits between the pancreatic head and distal stomach. Previously indexed right external iliac lymph node measures 9 mm in short axis on image 58/2 previously 7 mm -additional right external iliac lymph node previously indexed measures 6 mm in short axis on image 66/2 previously 7 mm. Reproductive: Prior hysterectomy with similar asymmetric nodular soft tissue on the right side of the vaginal cuff on image 67/2. no suspicious adnexal mass. Other: No significant abdominopelvic free fluid. Postsurgical change in the anterior abdominal wall with fat containing incisional hernia. Musculoskeletal: No aggressive lytic or blastic lesion of bone. Spinal fusion hardware. Diffuse demineralization of bone. Thoracolumbar spondylosis. IMPRESSION: 1. Interval increase in size of the heterogeneous enlarged lymph node/mass between the pancreatic head and distal stomach. 2. Slight interval increase in size of the centrally necrotic pericardiophrenic lymph node. 3. Previously indexed right external iliac lymph nodes are similar to slightly increased in size. 4. Prior hysterectomy with similar asymmetric nodular soft tissue on the right side of the vaginal cuff. 5. Similar prominence of the left ureter with 8 mm stone at the UVJ. 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 Electronically Signed   By: Reyes Holder M.D.   On: 02/24/2024 13:03   MR BRAIN W WO CONTRAST Result Date: 02/18/2024 CLINICAL DATA:  Multiple sclerosis. EXAM: MRI HEAD WITHOUT AND WITH CONTRAST TECHNIQUE: Multiplanar, multiecho pulse sequences of the brain and surrounding structures were obtained without and with intravenous contrast. CONTRAST:  8 mL Vueway  COMPARISON:  Head MRI 10/26/2022 FINDINGS: Brain: There is no evidence of an acute infarct, intracranial hemorrhage, mass, midline  shift, or extra-axial fluid collection. Cerebral volume is within normal limits for age. The ventricles are normal in size. T2 hyperintensities in the juxtacortical, periventricular, and deep cerebral white matter bilaterally and in the pons are unchanged and moderately advanced for age. None show enhancement or restricted diffusion. Mild T2 heterogeneity in the thalami and at most  minimal in the cerebellum are unchanged. Vascular: Major intracranial vascular flow voids are preserved. Skull and upper cervical spine: Unremarkable bone marrow signal. Sinuses/Orbits: Unremarkable orbits. Paranasal sinuses and mastoid air cells are clear. Other: None. IMPRESSION: Unchanged moderately advanced white matter disease, nonspecific but may reflect a combination chronic demyelinating disease and chronic small vessel ischemia. No evidence of active demyelination. Electronically Signed   By: Dasie Hamburg M.D.   On: 02/18/2024 08:06      03/29/2024 Imaging   US  Venous Img Upper Uni Left (DVT) Result Date: 04/01/2024 CLINICAL DATA:  LEFT upper extremity pain. History of tobacco use and ovarian malignancy. EXAM: LEFT UPPER EXTREMITY VENOUS DOPPLER ULTRASOUND TECHNIQUE: Gray-scale sonography with graded compression, as well as color Doppler and duplex ultrasound were performed to evaluate the upper extremity deep venous system from the level of the subclavian vein and including the jugular, axillary, basilic, radial, ulnar and upper cephalic vein. Spectral Doppler was utilized to evaluate flow at rest and with distal augmentation maneuvers. COMPARISON:  None available FINDINGS: Internal Jugular Vein: No evidence of thrombus. Normal compressibility, respiratory phasicity and response to augmentation. Subclavian Vein: No evidence of thrombus. Normal compressibility, respiratory phasicity and response to augmentation. Axillary Vein: No evidence of thrombus. Normal compressibility, respiratory phasicity and response to  augmentation. Cephalic Vein: Intramural filling defect with decreased flow and compressibility of the cephalic vein and adjacent varicose vein at the level of the mid upper arm is consistent with acute DVT. This also corresponds to the patient's palpable abnormality. Basilic Vein: No evidence of thrombus. Normal compressibility, respiratory phasicity and response to augmentation. Brachial Veins: No evidence of thrombus. Normal compressibility, respiratory phasicity and response to augmentation. Radial Veins: No evidence of thrombus. Normal compressibility, respiratory phasicity and response to augmentation. Ulnar Veins: No evidence of thrombus. Normal compressibility, respiratory phasicity and response to augmentation. Venous Reflux:  None visualized. Other Findings:  None visualized. IMPRESSION: 1. No DVT of the LEFT upper extremity. 2. Acute superficial venous thrombosis of the cephalic vein and adjacent varicose vein at the level of the mid upper arm. Electronically Signed   By: Aliene Lloyd M.D.   On: 04/01/2024 07:35   CT ANGIO GI BLEED Result Date: 03/29/2024 CLINICAL DATA:  Lightheadedness, dark stool for 1 month, epigastric pain for several days, history of ovarian cancer EXAM: CTA ABDOMEN AND PELVIS WITHOUT AND WITH CONTRAST TECHNIQUE: Multidetector CT imaging of the abdomen and pelvis was performed using the standard protocol during bolus administration of intravenous contrast. Multiplanar reconstructed images and MIPs were obtained and reviewed to evaluate the vascular anatomy. RADIATION DOSE REDUCTION: This exam was performed according to the departmental dose-optimization program which includes automated exposure control, adjustment of the mA and/or kV according to patient size and/or use of iterative reconstruction technique. CONTRAST:  OMNIPAQUE  IOHEXOL  350 MG/ML SOLN COMPARISON:  02/22/2024 FINDINGS: VASCULAR Aorta: Normal caliber aorta without aneurysm, dissection, vasculitis or  significant stenosis. Mild atherosclerosis. Celiac: Patent without evidence of aneurysm, dissection, vasculitis or significant stenosis. SMA: Patent without evidence of aneurysm, dissection, vasculitis or significant stenosis. Renals: Both renal arteries are patent without evidence of aneurysm, dissection, vasculitis, fibromuscular dysplasia or significant stenosis. IMA: Patent without evidence of aneurysm, dissection, vasculitis or significant stenosis. Inflow: Patent without evidence of aneurysm, dissection, vasculitis or significant stenosis. Evaluation is slightly limited due to streak artifact from lumbar orthopedic hardware. Proximal Outflow: Bilateral common femoral and visualized portions of the superficial and profunda femoral arteries are patent without evidence of aneurysm, dissection, vasculitis or  significant stenosis. Veins: Continue mass effect upon the portal vein confluence due to the large mass between the duodenum and pancreatic head. No other acute venous abnormalities. Review of the MIP images confirms the above findings. NON-VASCULAR Lower chest: Pericardiophrenic lymph node is again identified, measuring 2.4 x 2.2 cm, previously 2.5 x 2.0 cm. No acute airspace disease. Hepatobiliary: No focal liver abnormality is seen. No gallstones, gallbladder wall thickening, or biliary dilatation. Pancreas: Unremarkable. No pancreatic ductal dilatation or surrounding inflammatory changes. Spleen: Normal in size without focal abnormality. Adrenals/Urinary Tract: Kidneys enhance normally. No urinary tract calculi. 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 No left-sided obstruction. 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 No specific follow-up is required. Left adrenal is unremarkable. Bladder is normal. Stomach/Bowel: No bowel obstruction or ileus. Prior sigmoid colon resection and reanastomosis. Previous appendectomy. There  is a large necrotic 7.8 x 8.7 x 8.1 cm mass interposed between the head of the pancreas and duodenal ball. This mass displaces and likely invades the wall of the distal stomach and proximal duodenum, and may be the source for chronic blood loss. However, on today's exam, there is no evidence of contrast accumulation within the bowel lumen to suggest active hemorrhage. Lymphatic: Previous index enlarged lymph node in the right external iliac chain now measures 12 mm in short axis reference image 62/13, previously 9 mm. Stable right external iliac/inguinal lymph node image 69/13 measuring 6 mm in short axis. No new adenopathy is identified. Reproductive: Stable postsurgical changes from hysterectomy. Other: No free fluid or free intraperitoneal gas. Enlarged para incisional hernia in the right supraumbilical region, now containing a short segment of small bowel. No evidence of incarceration or obstruction. Musculoskeletal: Stable postsurgical changes within the lower lumbar spine. There are no acute or destructive bony abnormalities. Reconstructed images demonstrate no additional findings. IMPRESSION: VASCULAR 1. No evidence of active gastrointestinal bleeding. 2.  Aortic Atherosclerosis (ICD10-I70.0). NON-VASCULAR 1. Enlarging necrotic soft tissue mass interposed between the head of the pancreas and proximal duodenum. There is likely invasion of the dorsal wall of the distal stomach and duodenum, and this could be a source for chronic blood loss given clinical history. There is no evidence of active hemorrhage on today's exam. 2. Progressive adenopathy within the pericardiophrenic region and right external iliac chain consistent with worsening metastatic disease. 3. Para incisional hernia in the right supraumbilical region, containing a short segment of small bowel. No incarceration or obstruction. Electronically Signed   By: Ozell Daring M.D.   On: 03/29/2024 16:19   DG Chest 2 View Result Date:  03/29/2024 CLINICAL DATA:  Chest pain. EXAM: CHEST - 2 VIEW COMPARISON:  Chest CT dated 09/19/2022. FINDINGS: Right-sided Port-A-Cath with tip over central SVC. No focal consolidation, pleural effusion, pneumothorax. The cardiac silhouette is within normal limits. No acute osseous pathology. Degenerative changes of the shoulders. IMPRESSION: No active cardiopulmonary disease. Electronically Signed   By: Vanetta Chou M.D.   On: 03/29/2024 13:01        ALLERGIES:  is allergic to cymbalta [duloxetine hcl], cyclobenzaprine hcl, celexa  [citalopram hydrobromide], fluticasone  furoate-vilanterol, gabapentin, opana  [oxymorphone hcl], and tramadol.  MEDICATIONS:  Current Outpatient Medications  Medication Sig Dispense Refill   albuterol  (VENTOLIN  HFA) 108 (90 Base) MCG/ACT inhaler Inhale 2 puffs into the lungs every 6 (six) hours as needed for shortness of breath.     amphetamine -dextroamphetamine  (ADDERALL ) 20 MG tablet Take 10-20 mg by mouth See admin instructions. Take  20 mg by mouth in the morning and 10 mg between 1-2 PM daily     Ascorbic Acid (VITAMIN C) 1000 MG tablet Take 1,000 mg by mouth daily.     ASHWAGANDHA PO Take 1 capsule by mouth daily.     baclofen  (LIORESAL ) 10 MG tablet Take 10 mg by mouth 3 (three) times daily as needed.     BLACK CURRANT SEED OIL PO Take 1 capsule by mouth daily.     dexamethasone  (DECADRON ) 4 MG tablet Take 1 tablet (4 mg total) by mouth daily. 30 tablet 6   hydroxychloroquine (PLAQUENIL) 200 MG tablet Take 200 mg by mouth 2 (two) times daily.     lidocaine -prilocaine  (EMLA ) cream Apply to affected area once 30 g 3   LORazepam  (ATIVAN ) 0.5 MG tablet Take 0.5 mg by mouth 3 (three) times daily as needed for anxiety.     mirtazapine  (REMERON ) 7.5 MG tablet Take 7.5 mg by mouth at bedtime.     Multiple Vitamin (MULTIVITAMIN WITH MINERALS) TABS tablet Take 1 tablet by mouth daily.     naloxone  (NARCAN ) nasal spray 4 mg/0.1 mL Place 1 spray into the nose as  needed (opioid overdose).     omeprazole  (PRILOSEC) 20 MG capsule TAKE 1 CAPSULE BY MOUTH EVERY DAY 90 capsule 1   ondansetron  (ZOFRAN ) 8 MG tablet Take 1 tablet (8 mg total) by mouth every 8 (eight) hours as needed for nausea or vomiting. 30 tablet 1   OVER THE COUNTER MEDICATION Take 2 capsules by mouth daily. [place] advanced supplement     oxyCODONE  ER (XTAMPZA  ER) 13.5 MG C12A Take 13.5 mg by mouth in the morning, at noon, and at bedtime.     Oxycodone  HCl 20 MG TABS Take 20 mg by mouth 5 (five) times daily as needed (pain).     polyethylene glycol (MIRALAX  / GLYCOLAX ) 17 g packet Take 17 g by mouth daily.     prochlorperazine  (COMPAZINE ) 10 MG tablet Take 1 tablet (10 mg total) by mouth every 6 (six) hours as needed for nausea or vomiting. 30 tablet 1   Vitamin D, Ergocalciferol, (DRISDOL) 1.25 MG (50000 UNIT) CAPS capsule Take 50,000 Units by mouth every Sunday.     WIXELA INHUB 250-50 MCG/ACT AEPB Inhale 1 puff into the lungs daily.     No current facility-administered medications for this visit.    VITAL SIGNS: BP 112/73 (BP Location: Right Arm, Patient Position: Sitting, Cuff Size: Normal)   Pulse (!) 103   Temp 98.6 F (37 C) (Oral)   Resp (!) 24   Wt 148 lb 3.2 oz (67.2 kg)   BMI 21.89 kg/m  Filed Weights   05/27/24 0922  Weight: 148 lb 3.2 oz (67.2 kg)    Estimated body mass index is 21.89 kg/m as calculated from the following:   Height as of 05/09/24: 5' 9 (1.753 m).   Weight as of this encounter: 148 lb 3.2 oz (67.2 kg).  LABS: CBC:    Component Value Date/Time   WBC 7.3 05/20/2024 1413   WBC 7.0 05/09/2024 1227   HGB 7.6 (L) 05/22/2024 1249   HGB 8.5 (L) 05/20/2024 1413   HCT 23.9 (L) 05/22/2024 1249   PLT 409 (H) 05/20/2024 1413   MCV 88.0 05/20/2024 1413   NEUTROABS 4.8 05/20/2024 1413   LYMPHSABS 1.4 05/20/2024 1413   MONOABS 1.0 05/20/2024 1413   EOSABS 0.0 05/20/2024 1413   BASOSABS 0.0 05/20/2024 1413   Comprehensive Metabolic Panel:  Component Value Date/Time   NA 132 (L) 05/20/2024 1413   K 4.0 05/20/2024 1413   CL 96 (L) 05/20/2024 1413   CO2 25 05/20/2024 1413   BUN 13 05/20/2024 1413   CREATININE 0.84 05/20/2024 1413   GLUCOSE 127 (H) 05/20/2024 1413   CALCIUM 9.0 05/20/2024 1413   AST 21 05/20/2024 1413   ALT 10 05/20/2024 1413   ALKPHOS 80 05/20/2024 1413   BILITOT 0.3 05/20/2024 1413   PROT 6.6 05/20/2024 1413   ALBUMIN  3.3 (L) 05/20/2024 1413    RADIOGRAPHIC STUDIES: IR Radiologist Eval & Mgmt Result Date: 05/27/2024 EXAM: NEW PATIENT OFFICE VISIT CHIEF COMPLAINT: See below HISTORY OF PRESENT ILLNESS: See below REVIEW OF SYSTEMS: See below PHYSICAL EXAMINATION: See below ASSESSMENT AND PLAN: Please refer to completed note in the electronic medical record on [facility] Thom Hall, MD [facility] Electronically Signed   By: Thom Hall M.D.   On: 05/27/2024 09:02   DG Chest 2 View Result Date: 05/09/2024 CLINICAL DATA:  Chest pain EXAM: CHEST - 2 VIEW COMPARISON:  March 29, 2024 FINDINGS: The heart size and mediastinal contours are within normal limits. Stable right internal jugular Port-A-Cath. Both lungs are clear. The visualized skeletal structures are unremarkable. IMPRESSION: No active cardiopulmonary disease. Electronically Signed   By: Lynwood Landy Raddle M.D.   On: 05/09/2024 13:59    PERFORMANCE STATUS (ECOG) : 1 - Symptomatic but completely ambulatory  Review of Systems Unless otherwise noted, a complete review of systems is negative.  Physical Exam General: NAD Cardiovascular: regular rate and rhythm Pulmonary: clear ant fields Abdomen: soft, nontender, + bowel sounds GU: no suprapubic tenderness Extremities: no edema, no joint deformities Skin: no rashes Neurological: Weakness but otherwise nonfocal  IMPRESSION: Patient recently hospitalized with GI bleed from known mass invading the small bowel.  She was discharged home  with plan to initiate hospice.  However, she transferred care to our cancer center from [facility] with plan for ongoing supportive care/transfusion.  Patient was previously referred to IR for consideration of embolization of fungating duodenal mass.  However, patient canceled appointment due to weakness and fatigue.  This has subsequently been rescheduled and patient reports IR plans to repeat CT.  Patient presents to clinic with severe low back pain.  Also having moderate abdominal pain.  Patient states that pain has been worse since she ran out of her oxycodone  over the weekend.  She has reached out to her neurologist who is refilled oxycodone  the patient has yet to pick it up from the pharmacy.  Patient reports that pain is generally better controlled on her existing pain regimen.  PDMP reviewed.  Patient has been Xtampza  ER 13.5 mg 3 times daily and oxycodone  20 mg 5 times daily for many months.  This has been prescribed by [facility] neurology -[facility] office.  Xtampza  was last filled on 05/09/2024 and oxycodone  last filled on 04/29/2024.  Will give dose of oxycodone  in clinic today to bridge until patient can get to pharmacy to pick up her existing prescription.  Patient denies bleeding.  No other symptomatic complaints or concerns.  Patient receiving weekly Venofer .  Will go ahead and check H&H today for consideration of transfusion.  PLAN: - Continue current scope of treatment - Supportive care with transfusions - Continue Xtampza /oxycodone  - Oxycodone  20 mg x 1 in clinic - Check H&H today - Oral nutritional supplements (samples provided) - Community palliative care is following - Venofer  and possible  transfusion later this week - RTC 2 weeks  Case and plan discussed with Dr. Babara  Patient expressed understanding and was in agreement with this plan. She also understands that She can call the clinic at any time with any questions, concerns, or complaints.     Time Total: 20  minutes  Visit consisted of counseling and education dealing with the complex and emotionally intense issues of symptom management and palliative care in the setting of serious and potentially life-threatening illness.Greater than 50%  of this time was spent counseling and coordinating care related to the above assessment and plan.  Signed by: Fonda Mower, PhD, NP-C   "

## 2024-05-28 ENCOUNTER — Inpatient Hospital Stay

## 2024-05-28 ENCOUNTER — Telehealth: Payer: Self-pay

## 2024-05-28 ENCOUNTER — Other Ambulatory Visit: Payer: Self-pay | Admitting: Interventional Radiology

## 2024-05-28 VITALS — BP 126/73 | HR 102 | Temp 97.2°F | Resp 18

## 2024-05-28 DIAGNOSIS — K3189 Other diseases of stomach and duodenum: Secondary | ICD-10-CM

## 2024-05-28 DIAGNOSIS — D500 Iron deficiency anemia secondary to blood loss (chronic): Secondary | ICD-10-CM | POA: Diagnosis not present

## 2024-05-28 DIAGNOSIS — Z95828 Presence of other vascular implants and grafts: Secondary | ICD-10-CM

## 2024-05-28 MED ORDER — IRON SUCROSE 20 MG/ML IV SOLN
200.0000 mg | Freq: Once | INTRAVENOUS | Status: AC
  Administered 2024-05-28: 200 mg via INTRAVENOUS

## 2024-05-28 NOTE — Telephone Encounter (Signed)
 Pathology contacted for FOLR1 results. Specimen was sent out 05/19/24 for testing and is still pending.

## 2024-05-28 NOTE — Patient Instructions (Signed)

## 2024-05-29 ENCOUNTER — Ambulatory Visit: Admission: RE | Admit: 2024-05-29 | Source: Ambulatory Visit

## 2024-05-29 ENCOUNTER — Other Ambulatory Visit (HOSPITAL_COMMUNITY): Payer: Self-pay | Admitting: Interventional Radiology

## 2024-05-29 ENCOUNTER — Inpatient Hospital Stay: Admission: RE | Admit: 2024-05-29 | Source: Ambulatory Visit

## 2024-05-29 ENCOUNTER — Inpatient Hospital Stay

## 2024-05-29 DIAGNOSIS — K3189 Other diseases of stomach and duodenum: Secondary | ICD-10-CM

## 2024-05-30 ENCOUNTER — Telehealth: Payer: Self-pay | Admitting: Oncology

## 2024-05-30 ENCOUNTER — Telehealth: Payer: Self-pay | Admitting: *Deleted

## 2024-05-30 DIAGNOSIS — C563 Malignant neoplasm of bilateral ovaries: Secondary | ICD-10-CM

## 2024-05-30 NOTE — Telephone Encounter (Signed)
 "    NURSING SYMPTOM TRIAGE ASSESSMENT & DISPOSITION  Mode of interaction:  Telephone Date of symptom triage interaction:  05/30/24 Time of symptom triage interaction:  344pm  Cancer diagnosis:  Malignant neoplasm of both ovaries (HCC)   Symptom Triage Protocol:  Pain  History of the problem:  Pain Pain Rating (0-10): Currently 5/10, on average 8/10, at worst 10/10, at best 5/10  Pain Location: Lower back  Pain Quality: Burning  Associated Pain Symptoms: None  When Did The Pain Start: After the last iron  infusion on 05/28/24  Is the Pain Constant: No  Does the Pain Flare:   No  Does Anything Make the Pain Worse: No  Does Anything Make the Pain Better:  Yes, oxycodone      Assess for Changes in ADLs: none     In the past, what treatments have you tried for your pain:   Did these treatments help:   Additional commentary: Patient called triage line to report that I felt like I had a lot of back pain on my right side after my last iron  infusion. It's a burning pain in my lower back and it's still here. Rating 5/10.   Patient states that the burning pain in her back is worse in the night. She feels like it's contributing to her last iron  infusion, which was on 05/28/2024.  Denies any nausea, vomiting, dysuria, diarrhea or constipation.  My left foot started swelling up after my iron  infusion as well, but I have a lymphatic machine. The swelling has gone down some.   She has oxycodone  at home and has been using this as directed but wanted to know if the back pain was r/t to the iron  infusions.      Triage Nurse Guidance Severity Index  Seek emergency care, call an ambulance immediately for: Signs/symptoms of acute injury, spinal cord compression, pathologic fracture, infection, or other life threatening problem Sudden onset of severe weakness or unrelenting localized pain Inability to ambulate or decreased sensation in extremities Loss of control of bowel or bladder Chest  pain  Seek medical care within two to four hours for: Sudden onset of moderate to severe pain Pain not responsive to current medication regimen Pain that interferes with mobility  Seek medical care within twenty-four hours for: Mild to moderate pain that has been increasing Pain that is interfering with activity or sleep  Follow home care instructions for: Mild to moderate aches and pains Notify the provider if no improvement within twenty-four hours    Patient Instruction  Disclaimer:  Patient specific and Elsevier instruction provided verbally and sent through MyChart for active users.    Report the following problems No improvement in pain Pain that does not subside with interventions Other side effects, such as sedation, nausea, or constipation  Seek emergency care immediately if any of the following occur: Excruciating pain Immobility Low back pain associated with loss of bladder or bowel control; bilateral extremity weakness  Teach Back Method used:  Yes     Provider Consulted  Provider name and credentials: Msg sent to Dr. Babara  Provider instruction: Note-msg sent to Dr. Babara     Nurse Triage Priority:  Non-urgent (review and reinforce self-care strategies)  Barriers to Care:  None identified  Nurse Triage Disposition:  Home care, return call if no improvement within 24 hours She will call back if pain worsens.  Protocol Source:  Ruthine HERO., & Eldonna CANDIE Pee.). (2019). Telephone triage for oncology nurses (3rd ed.). [employer]cology Nursing  S[employer].  "

## 2024-05-30 NOTE — Telephone Encounter (Signed)
 Clinic will be closed due to weather on 1/26. I called and confirmed the r/s appts with pt.

## 2024-06-01 ENCOUNTER — Other Ambulatory Visit: Payer: Self-pay | Admitting: Radiology

## 2024-06-01 DIAGNOSIS — K3189 Other diseases of stomach and duodenum: Secondary | ICD-10-CM

## 2024-06-02 ENCOUNTER — Inpatient Hospital Stay

## 2024-06-02 NOTE — Consult Note (Signed)
 "    Reason for visit: Symptomatic, bleeding small bowel metastasis   Care Teams(s) PCP; Arloa Elsie SAUNDERS, MD   Medical Oncology; Babara Call, MD Palliative Medicine; Borders, Fonda SAUNDERS, NP Consulting VIR; Willard Madrigal MD  History of Present Illness:  [name] is a 69 y.o. female with a history of recurrent bilateral ovarian cancer that was initially diagnosed on CT A/P on 04/11/22. She was following with Dr. Viktoria in GYN-ONC and underwent total hysterectomy with bilateral salpingo-oophorectomy, appendectomy, rectosigmoid resection with re-anastomosis, and total omentectomy. This was followed by chemotherapy through 12/2022 and surveillance. She was unfortunately noted to have new metastatic disease on imaging in 06/2023. She is known to IR from previous port-placements in 2024 and 2025.  In November 2025, she underwent hospitalization for symptomatic anemia and upper GI bleed secondary to a necrotic, bleeding duodenal mass. CTA at that time revealed:   Large necrotic 7.8 x 8.7 x 8.1cm mass interposed between the head of the pancreas and duodenal ball. This mass displaces and likely invades the wall of the distal stomach and proximal duodenum and may be the source for chronic blood loss; however, on today's exam, there is no evidence of contrast accumulation within the bowel lumen to suggest active hemorrhage.   She was discharged home, but unfortunately returned to the hospital in late December with similar concerns. Following this, she had plans to discontinue treatments and pursue hospice care; however, patient wishes to continue receiving blood transfusions and recently expressed interest in additional interventions to improve her bleeding leading to referral to IR.  She was scheduled for initial evaluation on 1/13, but presented to her Oncologist instead due to weakness and concerns for needing a blood transfusion. H/H that day stable at 8.5/27.1 following previous transfusion on  05/12/24 and she was referred to Dietary to discuss strategies to increase PO intake.   She presents today to discuss possible embolization for ongoing bleeding from ulcerating duodenal mass. Pt admits to intermittent bleeding episodes within the last 6 mos, requiring Fe3+ and Hb transfusions. Most recent labs significant for H/H of 7.6/23.9, CA 125 of 60.1, Ferritin of 79, and iron  of 24.   Review of Systems: A 12-point ROS discussed, and pertinent positives are indicated in the HPI above.  All other systems are negative.  Past Medical History:  Diagnosis Date   Anxiety    Asthma    Back problem    disc disease, chronic low back pain, right leg pain, s/p discectomy 99   Cancer (HCC)    ovarian cancer   Depression    Diastolic dysfunction    with elevated LVEDP at cath   Endometriosis    Fibroids    hx of    GERD (gastroesophageal reflux disease)    Heart murmur    Hepatitis C    treated 8-9 years ago   Hidradenitis suppurativa    History of kidney stones    Hypertension    MS (multiple sclerosis)    Neuromuscular disorder (HCC)    Mulitple sclerosis   Osteoarthritis    Pre-diabetes    Prediabetes    RA (rheumatoid arthritis) (HCC)    Rotator cuff tear    right shoulder    Syncope     Past Surgical History:  Procedure Laterality Date   ANGIOPLASTY     BACK SURGERY     lumbar surgery x 2 done in [place]  and [place]   COLON RESECTION SIGMOID  08/16/2022  Procedure: COLON RESECTION SIGMOID;  Surgeon: Viktoria Comer SAUNDERS, MD;  Location: [facility];  Service: Gynecology;;   COLONOSCOPY  07/2010   tubular adenoma   COLONOSCOPY  08/2015   CYSTOSCOPY N/A 08/16/2022   Procedure: CYSTOSCOPY;  Surgeon: Viktoria Comer SAUNDERS, MD;  Location: [facility];  Service: Gynecology;  Laterality: N/A;   DISKECTOMY     ESOPHAGOGASTRODUODENOSCOPY N/A 03/30/2024   Procedure: EGD (ESOPHAGOGASTRODUODENOSCOPY);  Surgeon: Maryruth Ole DASEN, MD;  Location: [facility];  Service: Endoscopy;   Laterality: N/A;   HYSTERECTOMY ABDOMINAL WITH SALPINGO-OOPHORECTOMY  08/16/2022   Procedure: HYSTERECTOMY ABDOMINAL WITH SALPINGO-OOPHORECTOMY;  Surgeon: Viktoria Comer SAUNDERS, MD;  Location: [facility];  Service: Gynecology;;   IR IMAGING GUIDED PORT INSERTION  05/18/2022   IR IMAGING GUIDED PORT INSERTION  07/03/2023   IR RADIOLOGIST EVAL & MGMT  05/27/2024   IR REMOVAL TUN ACCESS W/ PORT W/O FL MOD SED  12/20/2022   IR US  GUIDE BX ASP/DRAIN  05/18/2022   LAPAROSCOPY N/A 08/16/2022   Procedure: LAPAROSCOPY DIAGNOSTIC;  Surgeon: Viktoria Comer SAUNDERS, MD;  Location: [facility];  Service: Gynecology;  Laterality: N/A;   laparoscopy for endometriosis     left shoulder scope     RADIOLOGY WITH ANESTHESIA N/A 05/18/2022   Procedure: IR WITH ANESTHESIA PORT AND BIOPSY;  Surgeon: Luverne Aran, MD;  Location: [facility];  Service: Radiology;  Laterality: N/A;   ROTATOR CUFF REPAIR Left    Spinal Fusion  12/2016   L4-S1    Allergies: Cymbalta [duloxetine hcl], Cyclobenzaprine hcl, Celexa  [citalopram hydrobromide], Fluticasone  furoate-vilanterol, Gabapentin, Opana  [oxymorphone hcl], and Tramadol  Medications: Prior to Admission medications  Medication Sig Start Date End Date Taking? Authorizing Provider  albuterol  (VENTOLIN  HFA) 108 (90 Base) MCG/ACT inhaler Inhale 2 puffs into the lungs every 6 (six) hours as needed for shortness of breath. 07/30/21   [provider]  amphetamine -dextroamphetamine  (ADDERALL ) 20 MG tablet Take 10-20 mg by mouth See admin instructions. Take 20 mg by mouth in the morning and 10 mg between 1-2 PM daily    [provider]  Ascorbic Acid (VITAMIN C) 1000 MG tablet Take 1,000 mg by mouth daily.    [provider]  ASHWAGANDHA PO Take 1 capsule by mouth daily.    [provider]  baclofen  (LIORESAL ) 10 MG tablet Take 10 mg by mouth 3 (three) times daily as needed.    [provider]  BLACK CURRANT SEED OIL PO Take 1 capsule by mouth daily.     [provider]  dexamethasone  (DECADRON ) 4 MG tablet Take 1 tablet (4 mg total) by mouth daily. 04/18/24   Lonn Hicks, MD  hydroxychloroquine (PLAQUENIL) 200 MG tablet Take 200 mg by mouth 2 (two) times daily. 01/23/24   [provider]  lidocaine -prilocaine  (EMLA ) cream Apply to affected area once 06/25/23   Lonn Hicks, MD  LORazepam  (ATIVAN ) 0.5 MG tablet Take 0.5 mg by mouth 3 (three) times daily as needed for anxiety.    [provider]  mirtazapine  (REMERON ) 7.5 MG tablet Take 7.5 mg by mouth at bedtime. 04/22/24   [provider]  Multiple Vitamin (MULTIVITAMIN WITH MINERALS) TABS tablet Take 1 tablet by mouth daily.    [provider]  naloxone  (NARCAN ) nasal spray 4 mg/0.1 mL Place 1 spray into the nose as needed (opioid overdose).    [provider]  omeprazole  (PRILOSEC) 20 MG capsule TAKE 1 CAPSULE BY MOUTH EVERY DAY 03/24/24   Lonn Hicks, MD  ondansetron  (  ZOFRAN ) 8 MG tablet Take 1 tablet (8 mg total) by mouth every 8 (eight) hours as needed for nausea or vomiting. 02/29/24   Lonn Hicks, MD  OVER THE COUNTER MEDICATION Take 2 capsules by mouth daily. [place] advanced supplement    [provider]  oxyCODONE  ER (XTAMPZA  ER) 13.5 MG C12A Take 13.5 mg by mouth in the morning, at noon, and at bedtime.    [provider]  Oxycodone  HCl 20 MG TABS Take 20 mg by mouth 5 (five) times daily as needed (pain).    [provider]  polyethylene glycol (MIRALAX  / GLYCOLAX ) 17 g packet Take 17 g by mouth daily.    [provider]  prochlorperazine  (COMPAZINE ) 10 MG tablet Take 1 tablet (10 mg total) by mouth every 6 (six) hours as needed for nausea or vomiting. 02/29/24   Lonn Hicks, MD  Vitamin D, Ergocalciferol, (DRISDOL) 1.25 MG (50000 UNIT) CAPS capsule Take 50,000 Units by mouth every Sunday.    [provider]  NAPOLEON INHUB 250-50 MCG/ACT AEPB Inhale 1 puff into the lungs daily.    [provider]     Family History  Problem Relation Age of Onset   Heart disease Mother        Died with MI 77, chest pain 30s   Hypertension Mother    Pancreatitis Father    Atrial fibrillation Sister    Breast cancer Maternal Aunt        dx <50   Stomach cancer Maternal Grandfather        mets to liver? dx after 50   Multiple sclerosis Cousin    Breast cancer Cousin        mat female cousin; dx unknown age   Colon cancer Cousin 5       mat female cousin; mets   Leukemia Cousin 22       mat female cousin    Social History   Socioeconomic History   Marital status: Single    Spouse name: Not on file   Number of children: 1   Years of education: Not on file   Highest education level: Not on file  Occupational History   Not on file  Tobacco Use   Smoking status: Some Days    Current packs/day: 0.00    Average packs/day: 0.1 packs/day for 27.0 years (2.7 ttl pk-yrs)    Types: Cigarettes    Start date: 89    Last attempt to quit: 2023    Years since quitting: 3.0   Smokeless tobacco: Never  Vaping Use   Vaping status: Never Used  Substance and Sexual Activity   Alcohol use: Not Currently   Drug use: Yes    Types: Oxycodone    Sexual activity: Not Currently  Other Topics Concern   Not on file  Social History Narrative   Lives alone   Right handed   Caffeine: none    Social Drivers of Health   Tobacco Use: High Risk (05/27/2024)   Patient History    Smoking Tobacco Use: Some Days    Smokeless Tobacco Use: Never    Passive Exposure: Not on file  Financial Resource Strain: Not on file  Food Insecurity: No Food Insecurity (04/24/2024)   Epic    Worried About Programme Researcher, Broadcasting/film/video in the Last Year: Never true    Ran Out of Food in the Last Year: Never true  Transportation Needs: No Transportation Needs (04/24/2024)   Epic  Lack of Transportation (Medical): No    Lack of Transportation (Non-Medical): No  Physical Activity: Not on file  Stress: Not on file   Social Connections: Socially Isolated (04/24/2024)   Social Connection and Isolation Panel    Frequency of Communication with Friends and Family: Three times a week    Frequency of Social Gatherings with Friends and Family: Never    Attends Religious Services: Never    Database Administrator or Organizations: No    Attends Banker Meetings: Never    Marital Status: Divorced  Depression (PHQ2-9): Low Risk (05/28/2024)   Depression (PHQ2-9)    PHQ-2 Score: 0  Alcohol Screen: Not on file  Housing: Low Risk (05/20/2024)   Epic    Unable to Pay for Housing in the Last Year: No    Number of Times Moved in the Last Year: 0    Homeless in the Last Year: No  Utilities: Not At Risk (04/24/2024)   Epic    Threatened with loss of utilities: No  Health Literacy: Not on file    Review of Systems As above  Vital Signs: BP 131/71 (BP Location: Left Arm, Patient Position: Sitting, Cuff Size: Normal)   Pulse (!) 114   Temp 98.1 F (36.7 C) (Oral)   Resp 16   Wt 65.8 kg   SpO2 95%   BMI 21.41 kg/m    Physical Exam  General: WN, NAD  CV: RRR on monitor Pulm: normal work of breathing on RA Abd: S, ND, NT MSK: Grossly normal. Ambulates w a cane. Psych: Appropriate affect.   Imaging:    CTA AP, 03/29/24 Imaging independently reviewed, demonstrating large, ~8 cm vascularized mass at proximal duodenum  IR Radiologist Eval & Mgmt Result Date: 05/27/2024 EXAM: NEW PATIENT OFFICE VISIT CHIEF COMPLAINT: See below HISTORY OF PRESENT ILLNESS: See below REVIEW OF SYSTEMS: See below PHYSICAL EXAMINATION: See below ASSESSMENT AND PLAN: Please refer to completed note in the electronic medical record on [place] [facility] Thom Hall, MD [facility] Radiology Electronically Signed   By: Thom Hall M.D.   On: 05/27/2024 09:02   DG Chest 2 View Result Date: 05/09/2024 CLINICAL DATA:  Chest pain EXAM: CHEST - 2 VIEW COMPARISON:   March 29, 2024 FINDINGS: The heart size and mediastinal contours are within normal limits. Stable right internal jugular Port-A-Cath. Both lungs are clear. The visualized skeletal structures are unremarkable. IMPRESSION: No active cardiopulmonary disease. Electronically Signed   By: Lynwood Landy Raddle M.D.   On: 05/09/2024 13:59    Labs:  CBC: Recent Labs    04/25/24 0611 05/09/24 1227 05/15/24 0854 05/20/24 1413 05/22/24 1249 05/27/24 1018  WBC 10.7* 7.0 7.5 7.3  --   --   HGB 7.5* 6.9* 8.3* 8.5* 7.6* 8.2*  HCT 23.3* 22.7* 26.6* 27.1* 23.9* 25.6*  PLT 295 498* 362 409*  --   --     COAGS: No results for input(s): INR, APTT in the last 8760 hours.  BMP: Recent Labs    04/25/24 0611 05/09/24 1227 05/15/24 0854 05/20/24 1413  NA 135 136 136 132*  K 3.7 4.1 3.7 4.0  CL 101 98 101 96*  CO2 25 24 24 25   GLUCOSE 125* 128* 99 127*  BUN 8 12 17 13   CALCIUM 8.0* 9.6 8.9 9.0  CREATININE 0.75 0.81 0.74 0.84  GFRNONAA >60 >60 >60 >60    LIVER FUNCTION TESTS: Recent Labs    04/18/24 1059 04/24/24  1323 05/15/24 0854 05/20/24 1413  BILITOT 0.4 0.3 <0.2 0.3  AST 18 18 23 21   ALT 12 10 15 10   ALKPHOS 74 56 77 80  PROT 6.8 6.5 6.9 6.6  ALBUMIN  3.8 3.1* 3.6 3.3*    PATHOLOGY:  Accession #: [id number]  Patient Name: Froio, Nakkia  Visit # : [id number]   MRN: [id number]  Physician: Maryruth Smalls  DOB/Age Jul 30, 1955 (Age: 4) Gender: F  Collected Date: 03/30/2024  Received Date: 03/31/2024   FINAL DIAGNOSIS        1. Small Intestine Biopsy, small bowel mass, cbx :       - METASTATIC HIGH GRADE SEROUS CARCINOMA CONSISTENT WITH PATIENT'S KNOWN OVARIAN PRIMARY.   TUMOR MARKERS:  Latest Reference Range & Units 11/02/23 12:10 11/15/23 13:21 03/31/24 12:48 05/20/24 14:13  Cancer Antigen (CA) 125 0.0 - 38.1 U/mL 40.0 (H) 32.3 62.6 (H) 60.1 (H)  (H): Data is abnormally high    Cancer Staging  Malignant neoplasm of both ovaries (HCC) Staging form: Ovary,  Fallopian Tube, and Primary Peritoneal Carcinoma, AJCC 8th Edition - Clinical stage from 05/23/2022: FIGO Stage IIIC (cT3c, cN0, cM0) - Signed by Lonn Hicks, MD on 05/23/2022 Stage prefix: Initial diagnosis    Assessment and Plan:  69 y/o F comorbid w PMHx of RA, MS and metastatic ovarian CA. Pt's malignancy is noted to be unresponsive to previous therapies and she underwent prior debulking surgery w GYN Onc, Dr Viktoria, on 08/16/22. Refractory bleeding and symptomatic anemia 2/2 ulcerated duodenal mass. Referral to VRI for palliative embolization.  ECOG 1; Symptomatic but completely ambulatory   Pt is interested in pursuing a minimally-invasive option for symptomatic tumor control and we discussed transarterial embolization with bland embolization.  The patient understood that this is not curative and may help control her disease burden and associated symptoms.     The procedure has been fully reviewed with the patient/patients authorized representative. The risks, benefits and alternatives have been explained, and the patient/patients authorized representative has consented to the procedure.  *HIGH BLEEDING RISK*   *CTA AP, (03/29/24) reviewed. Request more updated CTA for procedural planning.  *Expedited scheduling based on mutual availability. Tentatively for 06/06/24 at [facility] *Will plan for LEFT trans-radial access. *Same day procedure, no overnight admission. *Will obtain new labs (CBC, CMP, INR) and CA-125 on procedure day arrival.  Thank you for this interesting consult.  I greatly enjoyed meeting JERIE BASFORD and look forward to participating in their care.  A copy of this report was sent to the requesting provider on this date.  Electronically Signed:  Thom Hall, MD [facility] Radiology   Pager. [phone] Clinic. [phone]  I spent a total of 40 Minutes of face-to-face time during this encounter, greater than 50%  of which was counseling/coordinating care for treatment management for Mrs KRISHIKA BUGGE symptomatic duodenal tumor. "

## 2024-06-03 ENCOUNTER — Inpatient Hospital Stay: Admission: RE | Admit: 2024-06-03

## 2024-06-03 ENCOUNTER — Inpatient Hospital Stay

## 2024-06-04 ENCOUNTER — Inpatient Hospital Stay: Admitting: Nurse Practitioner

## 2024-06-04 ENCOUNTER — Ambulatory Visit

## 2024-06-04 ENCOUNTER — Other Ambulatory Visit: Payer: Self-pay | Admitting: Oncology

## 2024-06-04 ENCOUNTER — Inpatient Hospital Stay

## 2024-06-04 ENCOUNTER — Emergency Department

## 2024-06-04 ENCOUNTER — Other Ambulatory Visit: Payer: Self-pay

## 2024-06-04 ENCOUNTER — Encounter: Payer: Self-pay | Admitting: Oncology

## 2024-06-04 ENCOUNTER — Emergency Department
Admission: EM | Admit: 2024-06-04 | Discharge: 2024-06-04 | Disposition: A | Discharge status: Home / Self Care | Source: Ambulatory Visit | Attending: Emergency Medicine | Admitting: Emergency Medicine

## 2024-06-04 DIAGNOSIS — D649 Anemia, unspecified: Secondary | ICD-10-CM

## 2024-06-04 DIAGNOSIS — R509 Fever, unspecified: Secondary | ICD-10-CM

## 2024-06-04 DIAGNOSIS — R8271 Bacteriuria: Secondary | ICD-10-CM | POA: Insufficient documentation

## 2024-06-04 DIAGNOSIS — C563 Malignant neoplasm of bilateral ovaries: Secondary | ICD-10-CM

## 2024-06-04 DIAGNOSIS — R1010 Upper abdominal pain, unspecified: Secondary | ICD-10-CM | POA: Diagnosis not present

## 2024-06-04 DIAGNOSIS — G35D Multiple sclerosis, unspecified: Secondary | ICD-10-CM | POA: Diagnosis not present

## 2024-06-04 DIAGNOSIS — D500 Iron deficiency anemia secondary to blood loss (chronic): Secondary | ICD-10-CM | POA: Diagnosis not present

## 2024-06-04 LAB — CBC WITH DIFFERENTIAL (CANCER CENTER ONLY)
Abs Immature Granulocytes: 0.07 10*3/uL (ref 0.00–0.07)
Basophils Absolute: 0 10*3/uL (ref 0.0–0.1)
Basophils Relative: 0 %
Eosinophils Absolute: 0 10*3/uL (ref 0.0–0.5)
Eosinophils Relative: 0 %
HCT: 18.6 % — ABNORMAL LOW (ref 36.0–46.0)
Hemoglobin: 5.9 g/dL — CL (ref 12.0–15.0)
Immature Granulocytes: 1 %
Lymphocytes Relative: 17 %
Lymphs Abs: 1.2 10*3/uL (ref 0.7–4.0)
MCH: 27.6 pg (ref 26.0–34.0)
MCHC: 31.7 g/dL (ref 30.0–36.0)
MCV: 86.9 fL (ref 80.0–100.0)
Monocytes Absolute: 0.8 10*3/uL (ref 0.1–1.0)
Monocytes Relative: 10 %
Neutro Abs: 5.4 10*3/uL (ref 1.7–7.7)
Neutrophils Relative %: 72 %
Platelet Count: 264 10*3/uL (ref 150–400)
RBC: 2.14 MIL/uL — ABNORMAL LOW (ref 3.87–5.11)
RDW: 18.6 % — ABNORMAL HIGH (ref 11.5–15.5)
WBC Count: 7.5 10*3/uL (ref 4.0–10.5)
nRBC: 0 % (ref 0.0–0.2)

## 2024-06-04 LAB — COMPREHENSIVE METABOLIC PANEL WITH GFR
ALT: 19 U/L (ref 0–44)
AST: 43 U/L — ABNORMAL HIGH (ref 15–41)
Albumin: 2.8 g/dL — ABNORMAL LOW (ref 3.5–5.0)
Alkaline Phosphatase: 93 U/L (ref 38–126)
Anion gap: 9 (ref 5–15)
BUN: 12 mg/dL (ref 8–23)
CO2: 26 mmol/L (ref 22–32)
Calcium: 7.8 mg/dL — ABNORMAL LOW (ref 8.9–10.3)
Chloride: 98 mmol/L (ref 98–111)
Creatinine, Ser: 0.85 mg/dL (ref 0.44–1.00)
GFR, Estimated: >60 mL/min
Glucose, Bld: 118 mg/dL — ABNORMAL HIGH (ref 70–99)
Potassium: 3.7 mmol/L (ref 3.5–5.1)
Sodium: 134 mmol/L — ABNORMAL LOW (ref 135–145)
Total Bilirubin: 0.5 mg/dL (ref 0.0–1.2)
Total Protein: 5.9 g/dL — ABNORMAL LOW (ref 6.5–8.1)

## 2024-06-04 LAB — URINALYSIS, ROUTINE W REFLEX MICROSCOPIC
Bilirubin Urine: NEGATIVE
Glucose, UA: NEGATIVE mg/dL
Ketones, ur: NEGATIVE mg/dL
Leukocytes,Ua: NEGATIVE
Nitrite: NEGATIVE
Protein, ur: 100 mg/dL — AB
Specific Gravity, Urine: 1.018 (ref 1.005–1.030)
pH: 5 (ref 5.0–8.0)

## 2024-06-04 LAB — RESP PANEL BY RT-PCR (RSV, FLU A&B, COVID)  RVPGX2
Influenza A by PCR: NEGATIVE
Influenza B by PCR: NEGATIVE
Resp Syncytial Virus by PCR: NEGATIVE
SARS Coronavirus 2 by RT PCR: NEGATIVE

## 2024-06-04 LAB — SAMPLE TO BLOOD BANK

## 2024-06-04 LAB — PREPARE RBC (CROSSMATCH)

## 2024-06-04 MED ORDER — HYDROMORPHONE HCL 1 MG/ML IJ SOLN
1.0000 mg | Freq: Once | INTRAMUSCULAR | Status: AC
  Administered 2024-06-04: 1 mg via INTRAVENOUS
  Filled 2024-06-04: qty 1

## 2024-06-04 MED ORDER — HYDROMORPHONE HCL 1 MG/ML IJ SOLN
0.5000 mg | Freq: Once | INTRAMUSCULAR | Status: AC
  Administered 2024-06-04: 0.5 mg via INTRAVENOUS
  Filled 2024-06-04: qty 0.5

## 2024-06-04 MED ORDER — SODIUM CHLORIDE 0.9 % IV SOLN
10.0000 mL/h | Freq: Once | INTRAVENOUS | Status: AC
  Administered 2024-06-04: 10 mL/h via INTRAVENOUS

## 2024-06-04 NOTE — Progress Notes (Signed)
 DISCONTINUE OFF PATHWAY REGIMEN - Ovarian   [id number]:Bevacizumab  15 mg/kg D1 + Carboplatin  AUC=4 D1 + Gemcitabine  1,000 mg/m2 D1,8 q21 Days:   A cycle is every 21 days:     Bevacizumab -xxxx      Gemcitabine       Carboplatin    **Always confirm dose/schedule in your pharmacy ordering system**  PRIOR TREATMENT: Off Pathway: Bevacizumab  15 mg/kg D1 + Carboplatin  AUC=4 D1 + Gemcitabine  1,000 mg/m2 D1,8 q21 Days  START ON PATHWAY REGIMEN - Ovarian     A cycle is every 28 days:     Liposomal doxorubicin   **Always confirm dose/schedule in your pharmacy ordering system**  Patient Characteristics: High Grade Histologies, Recurrent or Progressive Disease, Second Line, Platinum Resistant or < 6 Months Since Last Therapy, Low, Medium, or Unknown Folate Receptor Alpha Expression OR Not a Candidate for Mirvetuximab, HER2 Negative/Unknown Histology: High Grade Histologies Therapeutic Status: Recurrent or Progressive Disease BRCA Mutation Status: Absent Line of Therapy: Second Line Folate Receptor Alpha (FR?) Expression: Low Mirvetuximab Candidacy: Candidate for Mirvetuximab HER2 Expression Status by IHC: Negative (IHC 0, 1+) Intent of Therapy: Non-Curative / Palliative Intent, Discussed with Patient

## 2024-06-04 NOTE — Progress Notes (Deleted)
 "    Palliative Medicine [facility] [phone] [phone]   Name: [name] Date: 06/04/2024 MRN: [id number]  DOB: 24-Mar-1956  Patient Care Team: Arloa Elsie SAUNDERS, MD as PCP - General (Family Medicine) [name] (Physician Assistant) Maurie Rayfield BIRCH, RN as Oncology Nurse Navigator Babara Call, MD as Consulting Physician (Oncology)    REASON FOR CONSULTATION: [name] is a 69 y.o. female with multiple medical problems including RA, MS, and metastatic ovarian cancer unresponsive to previous treatment.  Patient was hospitalized in November and December 2025 with GI bleed.  She had EGD with findings of a bleeding mass, invading the small bowel.  Patient has required frequent transfusions. She was referred to palliative care to discuss goals.   SOCIAL HISTORY:     reports that she has been smoking cigarettes. She started smoking about 30 years ago. She has a 2.7 pack-year smoking history. She has never used smokeless tobacco. She reports that she does not currently use alcohol. She reports current drug use. Drug: Oxycodone .  Patient is married.  Lives at home alone.  Has a daughter in [place].  Has a sister who is involved.  She recently retired from [employer] where she worked in [employer].  ADVANCE DIRECTIVES:  On file  CODE STATUS: Full code  PAST MEDICAL HISTORY: Past Medical History:  Diagnosis Date   Anxiety    Asthma    Back problem    disc disease, chronic low back pain, right leg pain, s/p discectomy 99   Cancer (HCC)    ovarian cancer   Depression    Diastolic dysfunction    with elevated LVEDP at cath   Endometriosis    Fibroids    hx of    GERD (gastroesophageal reflux disease)    Heart murmur    Hepatitis C    treated 8-9 years ago   Hidradenitis suppurativa    History of kidney stones    Hypertension    MS (multiple sclerosis)    Neuromuscular  disorder (HCC)    Mulitple sclerosis   Osteoarthritis    Pre-diabetes    Prediabetes    RA (rheumatoid arthritis) (HCC)    Rotator cuff tear    right shoulder    Syncope     PAST SURGICAL HISTORY:  Past Surgical History:  Procedure Laterality Date   ANGIOPLASTY     BACK SURGERY     lumbar surgery x 2 done in [place]  and [place]   COLON RESECTION SIGMOID  08/16/2022   Procedure: COLON RESECTION SIGMOID;  Surgeon: Viktoria Comer SAUNDERS, MD;  Location: [facility];  Service: Gynecology;;   COLONOSCOPY  07/2010   tubular adenoma   COLONOSCOPY  08/2015   CYSTOSCOPY N/A 08/16/2022   Procedure: CYSTOSCOPY;  Surgeon: Viktoria Comer SAUNDERS, MD;  Location: [facility];  Service: Gynecology;  Laterality: N/A;   DISKECTOMY     ESOPHAGOGASTRODUODENOSCOPY N/A 03/30/2024   Procedure: EGD (ESOPHAGOGASTRODUODENOSCOPY);  Surgeon: Maryruth Ole DASEN, MD;  Location[facility];  Service: Endoscopy;  Laterality: N/A;   HYSTERECTOMY ABDOMINAL WITH SALPINGO-OOPHORECTOMY  08/16/2022   Procedure: HYSTERECTOMY ABDOMINAL WITH SALPINGO-OOPHORECTOMY;  Surgeon: Viktoria Comer SAUNDERS, MD;  Location: [facility];  Service: Gynecology;;   IR IMAGING GUIDED PORT INSERTION  05/18/2022   IR IMAGING GUIDED PORT INSERTION  07/03/2023   IR RADIOLOGIST EVAL & MGMT  05/27/2024   IR REMOVAL TUN ACCESS W/ PORT W/O FL MOD SED  12/20/2022   IR US  GUIDE BX ASP/DRAIN  05/18/2022   LAPAROSCOPY N/A 08/16/2022   Procedure: LAPAROSCOPY DIAGNOSTIC;  Surgeon: Viktoria Comer SAUNDERS, MD;  Location: [facility];  Service: Gynecology;  Laterality: N/A;   laparoscopy for endometriosis     left shoulder scope     RADIOLOGY WITH ANESTHESIA N/A 05/18/2022   Procedure: IR WITH ANESTHESIA PORT AND BIOPSY;  Surgeon: Luverne Aran, MD;  Location: [facility];  Service: Radiology;  Laterality: N/A;   ROTATOR CUFF REPAIR Left    Spinal Fusion  12/2016   L4-S1    HEMATOLOGY/ONCOLOGY HISTORY:  Oncology History Overview Note  High grade serous, p53 mutated Neg  genetics, HRD not detected, MSI stable, low TMB 2, progressed on carboplatin  and gemcitabine , paclitaxel    Malignant neoplasm of both ovaries (HCC)  04/11/2022 Imaging   CT of the abdomen and pelvis on 04/11/2022 reveals a left adnexal masslike area measuring 5.9 x 3.6 cm.  Margins of this mass are irregular.  There is small fluid in the cul-de-sac as well as fullness of soft tissue about the right adnexa.  Discrete uterine structure is not identified.  Left upper quadrant nodules beneath the left hemidiaphragm (1 anterior to the spleen and the other to the stomach).  Omental/peritoneal nodularity and multiple perihepatic nodules are noted    04/18/2022 Tumor Marker   Patient's tumor was tested for the following markers: CA-125. Results of the tumor marker test revealed 123.   04/19/2022 Initial Diagnosis   Carcinomatosis (HCC)   04/28/2022 Imaging   1. Bulky bilateral ovarian masses and nodules, left-greater-than-right, which are essentially confluent with adjacent pelvic soft tissue nodularity and not significantly changed compared to prior examination dated 04/11/2022. 2. Extensive pelvic peritoneal thickening and nodularity. Multiple small peritoneal nodules throughout the abdomen and pelvis. Findings are consistent with peritoneal metastatic disease and likewise not significantly changed. 3. Small volume of loculated appearing fluid in the low pelvis. 4. 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 The mid to distal right ureter is obstructed by right ovarian mass and or soft tissue nodularity. 5. Calculus within the most distal left ureterovesicular junction or just within the bladder lumen measuring 0.7 cm, unchanged. No associated left-sided hydronephrosis. 6. Soft tissue attenuation nodule of the body of the right adrenal gland. Notably, this was also soft tissue attenuation on prior  noncontrast examination (i.e. not definitively macroscopic fat containing) although unchanged compared to prior examinations dating back to 2018 and almost certainly a benign adenoma. Attention on follow-up. 7. No evidence of metastatic disease in the chest. 8. Mild diffuse bilateral bronchial wall thickening. Background of very fine centrilobular nodularity, most concentrated in the lung apices. Findings are most consistent with smoking-related respiratory bronchiolitis.     05/18/2022 Procedure   Ultrasound-guided core biopsy performed of a 10 mm soft tissue peritoneal mass in the right upper quadrant just superficial to the right lobe of the liver. The procedure was performed under general anesthesia immediately following Port-A-Cath placement.   05/18/2022 Procedure   Placement of single lumen port a cath via right internal jugular vein. The catheter tip lies at the cavo-atrial junction. A power injectable port a cath was placed and is ready for immediate use.     05/18/2022 Pathology Results   A. PERITONEAL MASS, RIGHT, BIOPSY:  - Metastatic high grade serous carcinoma (see comment)   COMMENT:   Appropriately controlled immunohistochemical stains reveal tumor cells are positive for  PAX8, WT1 and p53.  The findings support the above interpretation.  This case was reviewed with Dr. Rebbecca who agrees with the above diagnosis.  A p16 stain is pending and will be reported in an addendum.    05/23/2022 Cancer Staging   Staging form: Ovary, Fallopian Tube, and Primary Peritoneal Carcinoma, AJCC 8th Edition - Clinical stage from 05/23/2022: FIGO Stage IIIC (cT3c, cN0, cM0) - Signed by Lonn Hicks, MD on 05/23/2022 Stage prefix: Initial diagnosis   05/29/2022 Imaging   1. Complex bilateral adnexal masses, as described above, consistent with the patient's known malignancy. 2. Subsequent marked severity mass effect on the distal bilateral common iliac veins, increased in severity when compared to the  prior exam. 3. Additional findings consistent with peritoneal metastasis within the pelvis. 4. Findings consistent with pelvic congestion syndrome. 5. Postoperative changes within the mid and lower lumbar spine. 6. Aortic atherosclerosis. 7. No evidence of venous thrombus within the abdomen, pelvis or proximal bilateral lower extremities.   Aortic Atherosclerosis (ICD10-I70.0).   06/02/2022 - 11/03/2022 Chemotherapy   Patient is on Treatment Plan : OVARIAN Carboplatin  (AUC 6) + Paclitaxel  (175) q21d X 6 Cycles     06/26/2022 Tumor Marker   Patient's tumor was tested for the following markers: CA-125. Results of the tumor marker test revealed 134.   08/04/2022 Imaging   CT ABDOMEN PELVIS W CONTRAST  Result Date: 08/04/2022 CLINICAL DATA:  Ovarian cancer, chemotherapy in progress, for restaging EXAM: CT ABDOMEN AND PELVIS WITH CONTRAST TECHNIQUE: Multidetector CT imaging of the abdomen and pelvis was performed using the standard protocol following bolus administration of intravenous contrast. RADIATION DOSE REDUCTION: This exam was performed according to the departmental dose-optimization program which includes automated exposure control, adjustment of the mA and/or kV according to patient size and/or use of iterative reconstruction technique. CONTRAST:  OMNIPAQUE  IOHEXOL  300 MG/ML  SOLN COMPARISON:  CT venogram abdomen/pelvis dated 05/29/2022. CT abdomen/pelvis dated 04/26/2022. FINDINGS: Lower chest: Lung bases are clear. Hepatobiliary: Liver is within normal limits. Gallbladder is unremarkable. No intrahepatic or extrahepatic ductal dilatation. Pancreas: Within normal limits. Spleen: Within normal limits. Adrenals/Urinary Tract: 2.4 cm right adrenal nodule (series 2/image 17), previously 2.0 cm in 2018, likely reflecting a benign adrenal adenoma. Left adrenal gland is within normal limits. Left kidney is within normal limits. Right kidney is notable for mild hydronephrosis with moderate  hydroureteronephrosis and duplicated ureters (series 2/image 38), chronic. Associated extrinsic compression at the level of the right adnexal mass (described below). Mildly thick-walled bladder, although underdistended. Stable 6 mm left UVJ calcification (series 2/image 68). Stomach/Bowel: Stomach is within normal limits. No evidence of bowel obstruction. Normal appendix (series 2/image 47). Left adnexal mass (described below) is favored to abut and directly invade the left lateral wall of the sigmoid colon (series 2/image 57). Vascular/Lymphatic: No evidence of abdominal aortic aneurysm. Atherosclerotic calcifications of the abdominal aorta and branch vessels. No suspicious abdominopelvic lymphadenopathy. Reproductive: Heterogeneous uterus with associated surface nodularity (series 2/image 65), likely reflecting peritoneal disease. This appearance is improved from the prior. 3.9 x 3.3 cm left adnexal mass (series 2/image 60), previously 5.3 x 3.9 cm, with extension along the sigmoid mesocolon (series 2/image 56) and suspected involvement of the sigmoid colon (series 2/image 57). 5.6 x 2.7 cm mixed cystic/solid right adnexal mass (series 2/image 89), previously 6.9 x 4.3 cm when measured in a similar fashion. Other: No abdominopelvic ascites. Scattered minimal peritoneal nodularity beneath the anterior abdominal wall, including a 5 mm nodule beneath the left anterior  abdominal wall (series 2/image 27) which previously measured 12 mm. Additional mild nodularity on series 2/images 32, 33, and 50. Musculoskeletal: Status post PLIF at L4-S1. Status post lateral fixation at L4-5. Status post anterior fixation at L5-S1. Mild degenerative changes of the lower thoracic spine. IMPRESSION: Improving bilateral adnexa masses in this patient with known ovarian cancer, as above. Suspected involvement of the sigmoid colon. No evidence of bowel obstruction. Improving peritoneal nodularity, as above. Additional ancillary findings as  above. Electronically Signed   By: Pinkie Pebbles M.D.   On: 08/04/2022 01:27      08/16/2022 Pathology Results   CASE: [id number] PATIENT: BARBEE MACE Surgical Pathology Report  Clinical History: Advanced gyn malignancy (crm)  FINAL MICROSCOPIC DIAGNOSIS:  A. OMENTUM, RESECTION: - Positive for carcinoma  B. LIVER ADHESION, EXCISION: - Benign fibroadipose tissue - Negative for carcinoma  C. SMALL BOWEL MESENTERIC IMPLANT, EXCISION: - Positive for carcinoma with extensive necrosis  D. UTERUS, CERVIX, BILATERAL FALLOPIAN TUBES AND OVARIES, AND APPENDIX, RESECTION: - Uterine serosa: Tumor deposits present - Cervix: Benign, nabothian cyst - Endometrium: Benign inactive endometrium, benign endometrial polyp - Myometrium: Leiomyoma - Right fallopian tube: Metastatic tumor deposits present - Right ovary: High-grade serous carcinoma - Left ovary: High-grade serous carcinoma - Benign appendix - See oncology table  E. COLON, RECTOSIGMOID, RESECTION: - Benign colonic mucosa - Margins free of carcinoma - Adherent fallopian tube with metastatic tumor deposits - 2 benign lymph nodes  F. RECTAL TUMOR IMPLANT, EXCISION: - Metastatic high-grade serous carcinoma   G. COLON, RECTOSIGMOID ANASTOMOTIC RINGS: - Benign anastomotic donuts  ONCOLOGY TABLE:   OVARY or FALLOPIAN TUBE or PRIMARY PERITONEUM: Resection  Procedure: Total abdominal hysterectomy, bilateral salpingo-oophorectomy, appendectomy, rectosigmoid resection, total omentectomy, small bowel nodule resection Specimen Integrity: Intact Tumor Site: Bilateral Tumor Size: Right ovary: 3.9 cm, left ovary: Approximately 1.2 cm Histologic Type: High-grade serous carcinoma Histologic Grade: High-grade Ovarian Surface Involvement: Present Fallopian Tube Surface Involvement: Present Implants: Present, small bowel mesenteric and rectovaginal septum Lymphatic and/or Vascular Invasion: Not identified Other Tissue/ Organ  Involvement: Fallopian tube, bilateral Largest Extrapelvic Peritoneal Focus: 1.4 cm, small bowel mesentery Peritoneal/Ascitic Fluid Involvement: Not applicable Chemotherapy Response Score (CRS): Not applicable, no known presurgical therapy Regional Lymph Nodes: Not applicable (no lymph nodes submitted or found) Distant Metastasis:      Distant Site(s) Involved: Not applicable Pathologic Stage Classification (pTNM, AJCC 8th Edition): pT3b, pN[not assigned] Ancillary Studies: Can be performed upon request Representative Tumor Block: D8 Comment(s): Tumor is staged as pT3 given the presence of extrapelvic peritoneal deposits on small bowel mesentery less than 2cm (1.4 cm) ([id number])    09/18/2022 Tumor Marker   Patient's tumor was tested for the following markers: CA-125. Results of the tumor marker test revealed 36.8.   10/15/2022 Genetic Testing   Negative [facility] Multi-Cancer +RNA Panel.  VUS in POLD1 at c.1322C>T (p.Thr441Met). Report date is 10/15/2022.   The Multi-Cancer + RNA Panel offered by [facility] includes sequencing and/or deletion/duplication analysis of the following 70 genes:  AIP*, ALK, APC*, ATM*, AXIN2*, BAP1*, BARD1*, BLM*, BMPR1A*, BRCA1*, BRCA2*, BRIP1*, CDC73*, CDH1*, CDK4, CDKN1B*, CDKN2A, CHEK2*, CTNNA1*, DICER1*, EPCAM (del/dup only), EGFR, FH*, FLCN*, GREM1 (promoter dup only), HOXB13, KIT, LZTR1, MAX*, MBD4, MEN1*, MET, MITF, MLH1*, MSH2*, MSH3*, MSH6*, MUTYH*, NF1*, NF2*, NTHL1*, PALB2*, PDGFRA, PMS2*, POLD1*, POLE*, POT1*, PRKAR1A*, PTCH1*, PTEN*, RAD51C*, RAD51D*, RB1*, RET, SDHA* (sequencing only), SDHAF2*, SDHB*, SDHC*, SDHD*, SMAD4*, SMARCA4*, SMARCB1*, SMARCE1*, STK11*, SUFU*, TMEM127*, TP53*, TSC1*, TSC2*, VHL*. RNA analysis is performed for * genes.  11/06/2022 Tumor Marker   Patient's tumor was tested for the following markers: CA-125. Results of the tumor marker test revealed 16.8.   12/11/2022 Tumor Marker   Patient's tumor was tested for the following markers:  CA-125. Results of the tumor marker test revealed 12.5.   12/14/2022 Imaging   CT ABDOMEN PELVIS W CONTRAST  Result Date: 12/13/2022 CLINICAL DATA:  Ovarian cancer restaging * Tracking Code: BO * EXAM: CT ABDOMEN AND PELVIS WITH CONTRAST TECHNIQUE: Multidetector CT imaging of the abdomen and pelvis was performed using the standard protocol following bolus administration of intravenous contrast. RADIATION DOSE REDUCTION: This exam was performed according to the departmental dose-optimization program which includes automated exposure control, adjustment of the mA and/or kV according to patient size and/or use of iterative reconstruction technique. CONTRAST:  OMNIPAQUE  IOHEXOL  300 MG/ML  SOLN COMPARISON:  08/02/2022 FINDINGS: Lower chest: Unremarkable Hepatobiliary: Unremarkable Pancreas: Unremarkable Spleen: Unremarkable Adrenals/Urinary Tract: 2.6 by 2.1 cm right adrenal mass, internal density 55 Hounsfield units on portal venous phase images, relative washout of 13% which is indeterminate. Back in 2016 I measure this lesion at 2.2 by 1.6 cm, on the more recent comparison of 07/18/2022 this lesion measured 2.6 by 2.1 cm similar to today. Accordingly this lesion is stable from March and only very minimally increased in size from 2016, most likely benign. This can likely be surveilled in the context of the patient's ongoing oncology CT examinations. 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 The ureters transition to relatively normal caliber in the vicinity of the iliac vessel cross over, a specific cause for the transition is not identified. There is also a duplicated left renal collecting system without hydroureter on the left Stable 7 mm calcification or stone in the vicinity of the left ureterovesical junction. Stomach/Bowel: Prominent stool throughout the colon favors constipation.  Anastomotic staple line in the sigmoid colon. No dilated small bowel. Interval appendectomy. Previous nodularity along bowel including the sigmoid colon no longer identified. Vascular/Lymphatic: Atherosclerosis is present, including aortoiliac atherosclerotic disease. No pathologic adenopathy. Reproductive: Interval hysterectomy with salpingo-oophorectomy. Other: Interval omentectomy. No current compelling findings of peritoneal tumor deposits. No ascites. Musculoskeletal: Prior fusion at L4-L5-S1. IMPRESSION: 1. Interval hysterectomy, omentectomy, and appendectomy. No compelling findings of recurrent or residual peritoneal tumor deposits. 2. 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 A specific cause for the transition in caliber is not identified. 3. Stable 7 mm calcification or stone in the vicinity of the left ureterovesical junction. Duplicated left renal collecting system. 4. Prominent stool throughout the colon favors constipation. 5. Chronically stable but otherwise nonspecific right adrenal mass, likely benign. 6. Aortic atherosclerosis. Aortic Atherosclerosis (ICD10-I70.0). Electronically Signed   By: Ryan Salvage M.D.   On: 12/13/2022 15:16      12/20/2022 Procedure   Successful removal of a RIGHT chest implanted Port-A-Cath.    03/19/2023 Tumor Marker   Patient's tumor was tested for the following markers: CA-125. Results of the tumor marker test revealed 19.7.   06/14/2023 Imaging   CT ABDOMEN PELVIS W CONTRAST Result Date: 06/21/2023 CLINICAL DATA:  Restaging ovarian cancer.  * Tracking Code: BO * EXAM: CT ABDOMEN AND PELVIS WITH CONTRAST TECHNIQUE: Multidetector CT imaging of the abdomen and pelvis was performed using the standard protocol following bolus administration of  intravenous contrast. RADIATION DOSE REDUCTION: This exam was  performed according to the departmental dose-optimization program which includes automated exposure control, adjustment of the mA and/or kV according to patient size and/or use of iterative reconstruction technique. CONTRAST:  OMNIPAQUE  IOHEXOL  300 MG/ML  SOLN COMPARISON:  CT abdomen and pelvis dated 12/08/2022 FINDINGS: Lower chest: No focal consolidation or pulmonary nodule in the lung bases. No pleural effusion or pneumothorax demonstrated. Partially imaged heart size is normal. Hepatobiliary: No focal hepatic lesions. No intra or extrahepatic biliary ductal dilation. Normal gallbladder. Pancreas: No focal lesions or main ductal dilation. Spleen: Normal in size without focal abnormality. Adrenals/Urinary Tract: Unchanged 2.0 cm right adrenal nodule measuring 43 HU (2:16). No left adrenal nodule. 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 Duplex left kidney. No hydronephrosis. Unchanged 7 mm calcification adjacent to the left ureterovesical junction. No focal bladder wall thickening. Stomach/Bowel: Normal appearance of the stomach. Postsurgical changes from rectosigmoid resection with patent anastomosis. No evidence of bowel wall thickening, distention, or inflammatory changes. Appendectomy. Vascular/Lymphatic: Aortic atherosclerosis. New and increased size of multi station lymphadenopathy and peritoneal nodules, for example: -12 mm cardiophrenic (2:2), previously partially imaged at 3 mm -new 12 mm midline epigastric anterior peritoneal (2:23) -new 3.1 x 2.9 cm peripancreatic (2:25) -14 mm right pelvic sidewall (2:56), previously 4 mm -11 mm right external iliac (2:60), previously 7 mm Reproductive: Status post hysterectomy and bilateral salpingo-oophorectomy. No adnexal masses. Increased lobulated soft tissue density adjacent to the right vaginal cuff measuring 2.9 x 1.8 cm (2:64), previously 2.4 x 1.7 cm Other: Status post total omentectomy. No free fluid, fluid collection, or free air.  Musculoskeletal: No acute or abnormal lytic or blastic osseous lesions. Postsurgical changes of L4-S1 spinal fixation. Hardware appears intact. Postsurgical changes of the anterior abdominal wall. IMPRESSION: 1. New and increased size of multi station lymphadenopathy and peritoneal nodules, consistent with metastatic disease. 2. Increased lobulated soft tissue density adjacent to the right vaginal cuff measuring 2.9 x 1.8 cm, previously 2.4 x 1.7 cm, suspicious for recurrent disease. 3. Unchanged 2.0 cm right adrenal nodule, favored benign. Recommend continued attention on follow-up. 4. 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 Duplex left kidney. No hydronephrosis. Unchanged 7 mm calcification adjacent to the left ureterovesical junction. 5.  Aortic Atherosclerosis (ICD10-I70.0). Electronically Signed   By: Limin  Xu M.D.   On: 06/21/2023 09:35      06/18/2023 Tumor Marker   Patient's tumor was tested for the following markers: CA-125. Results of the tumor marker test revealed 32.9.   07/03/2023 Procedure   Successful placement of a power injectable Port-A-Cath via the right internal jugular vein. The catheter is ready for immediate use.   07/04/2023 - 08/31/2023 Chemotherapy   Patient is on Treatment Plan : OVARIAN RECURRENT 3RD LINE Bevacizumab  D1 + Carboplatin  D1 + Gemcitabine  D1,8 (4/800) q21d     07/13/2023 Tumor Marker   Patient's tumor was tested for the following markers: CA-125. Results of the tumor marker test revealed 33.2.   08/17/2023 Tumor Marker   Patient's tumor was tested for the following markers: CA-125. Results of the tumor marker test revealed 16.1.   08/30/2023 Imaging   1. Status post hysterectomy and bilateral salpingo-oophorectomy. 2. Multistation adenopathy and peritoneal nodularity is again noted, compatible with metastatic disease. Mild increased size of peripancreatic node. Upper abdominal peritoneal nodule is slightly decreased in size in the  interval. Additional index lymph nodes are stable in the interval. 3. Increased soft tissue density adjacent to the vaginal  cuff, concerning for locally recurrent disease. 4. Duplex right kidney with mild hydronephrosis and hydroureter, unchanged. Stone within the left UVJ is again noted measuring 0.8 x 0.4 cm, unchanged from previous exam. 5. Stable right adrenal nodule.  None 6. Supraumbilical ventral abdominal wall hernia containing a nonobstructed loop of small bowel. 7.  Aortic Atherosclerosis (ICD10-I70.0).       09/27/2023 - 11/15/2023 Chemotherapy   Patient is on Treatment Plan : OVARIAN Paclitaxel  (80) I8,1,84,77 q28d     09/28/2023 Tumor Marker   Patient's tumor was tested for the following markers: CA-125. Results of the tumor marker test revealed 29.8.   11/05/2023 Tumor Marker   Patient's tumor was tested for the following markers: CA-125. Results of the tumor marker test revealed 40.   11/16/2023 Tumor Marker   Patient's tumor was tested for the following markers: CA-125. Results of the tumor marker test revealed 32.3.   11/22/2023 Imaging   CT ABDOMEN PELVIS W CONTRAST Result Date: 11/28/2023 CLINICAL DATA:  Ovarian cancer.  * Tracking Code: BO * EXAM: CT ABDOMEN AND PELVIS WITH CONTRAST TECHNIQUE: Multidetector CT imaging of the abdomen and pelvis was performed using the standard protocol following bolus administration of intravenous contrast. RADIATION DOSE REDUCTION: This exam was performed according to the departmental dose-optimization program which includes automated exposure control, adjustment of the mA and/or kV according to patient size and/or use of iterative reconstruction technique. CONTRAST:  OMNIPAQUE  IOHEXOL  300 MG/ML  SOLN COMPARISON:  08/30/2023. FINDINGS: Lower chest: Lung bases are clear. Prepericardiac lymph node measures 1.6 cm, enlarged from 1.1 cm on 08/30/2023. Heart is at the upper limits of normal in size. No pericardial or pleural effusion.  Distal esophagus is grossly unremarkable. Hepatobiliary: Liver and gallbladder are unremarkable. No biliary ductal dilatation. Pancreas: Negative. Spleen: Negative. Adrenals/Urinary Tract: 2.4 cm right adrenal nodule, chronically stable and characterized previously 2 as an adenoma. No specific follow-up necessary. Minimal nodular thickening of the left adrenal gland, unchanged. Minimal scarring in the right kidney. Kidneys are otherwise unremarkable. Difficulty did right ureter appears to join in the midportion. Right ureter is decompressed. Left ureter may be minimally dilated secondary to an 8 mm stone at the left ureteral orifice, unchanged from 08/30/2023. Bladder is otherwise grossly unremarkable. Stomach/Bowel: Stomach, small bowel and colon are unremarkable. Appendix is not readily visualized. Vascular/Lymphatic: Atherosclerotic calcification of the aorta. Heterogeneous nodal mass or peritoneal implant between the distal stomach and pancreas measures 3.7 x 5.3 cm (2/29), enlarged from 3.0 x 3.7 cm. Right external iliac lymph node measures 7 mm (2/58), previously 12 mm. A second right external iliac lymph node measures 7 mm (2/64), stable. Reproductive: Hysterectomy. Similar asymmetric nodular soft tissue along the right vaginal cuff (2/66-67). No adnexal mass. Other: Previously seen ventral midline epigastric peritoneal nodule is no longer visualized. No free fluid. Small incisional hernia contains fat along the ventral supraumbilical midline. Musculoskeletal: Osteopenia. Degenerative changes in the spine. L5-S1 fusion. IMPRESSION: 1. Enlarging prepericardiac nodal metastasis. Enlarging nodal mass or peritoneal implant between the distal stomach and pancreas. 2. Resolved ventral midline epigastric peritoneal metastasis. 3. Right external iliac lymph nodes are stable to smaller in size. 4. Asymmetric nodular thickening of the right vaginal cuff, stable. 5. Trace left ureteral dilatation with a chronic 8 mm  stone at the left ureteral orifice. 6. Stable right adrenal nodule, previously characterized as an adenoma. 7.  Aortic atherosclerosis (ICD10-I70.0). Electronically Signed   By: Newell Eke M.D.   On: 11/28/2023 13:56  02/22/2024 Imaging   CT ABDOMEN PELVIS W CONTRAST Result Date: 02/24/2024 CLINICAL DATA:  Ovarian cancer, monitor, off chemotherapy. * Tracking Code: BO * EXAM: CT ABDOMEN AND PELVIS WITH CONTRAST TECHNIQUE: Multidetector CT imaging of the abdomen and pelvis was performed using the standard protocol following bolus administration of intravenous contrast. RADIATION DOSE REDUCTION: This exam was performed according to the departmental dose-optimization program which includes automated exposure control, adjustment of the mA and/or kV according to patient size and/or use of iterative reconstruction technique. CONTRAST:  OMNIPAQUE  IOHEXOL  300 MG/ML  SOLN COMPARISON:  Multiple priors including CT November 22, 2023 FINDINGS: Lower chest: No acute abnormality. Centrally necrotic pericardiophrenic lymph node measures 18 mm in short axis on image 3/2 previously 16 mm Hepatobiliary: No suspicious hepatic lesion. Gallbladder is unremarkable. No biliary ductal dilation. Pancreas: No pancreatic ductal dilation or evidence of acute inflammation. Spleen: No splenomegaly. Adrenals/Urinary Tract: 2.1 cm right adrenal nodule on image 20/2 stable over multiple prior examinations and previously characterized as a benign adenoma. Left adrenal gland appears normal. 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 Similar prominence of the left ureter with 8 mm stone at the UVJ. Stomach/Bowel: Stomach is minimally distended limiting evaluation. No pathologic dilation of small or large bowel. No evidence of acute bowel inflammation. Vascular/Lymphatic: Normal caliber abdominal aorta. Smooth IVC contours. Narrowing of the portal vein and portal confluence by a  heterogeneous enlarged lymph node/mass which measures 7.4 x 6.5 cm on image 27/2 previously 4.7 x 4.1 cm when remeasured for consistency, this sits between the pancreatic head and distal stomach. Previously indexed right external iliac lymph node measures 9 mm in short axis on image 58/2 previously 7 mm -additional right external iliac lymph node previously indexed measures 6 mm in short axis on image 66/2 previously 7 mm. Reproductive: Prior hysterectomy with similar asymmetric nodular soft tissue on the right side of the vaginal cuff on image 67/2. no suspicious adnexal mass. Other: No significant abdominopelvic free fluid. Postsurgical change in the anterior abdominal wall with fat containing incisional hernia. Musculoskeletal: No aggressive lytic or blastic lesion of bone. Spinal fusion hardware. Diffuse demineralization of bone. Thoracolumbar spondylosis. IMPRESSION: 1. Interval increase in size of the heterogeneous enlarged lymph node/mass between the pancreatic head and distal stomach. 2. Slight interval increase in size of the centrally necrotic pericardiophrenic lymph node. 3. Previously indexed right external iliac lymph nodes are similar to slightly increased in size. 4. Prior hysterectomy with similar asymmetric nodular soft tissue on the right side of the vaginal cuff. 5. Similar prominence of the left ureter with 8 mm stone at the UVJ. 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 Electronically Signed   By: Reyes Holder M.D.   On: 02/24/2024 13:03   MR BRAIN W WO CONTRAST Result Date: 02/18/2024 CLINICAL DATA:  Multiple sclerosis. EXAM: MRI HEAD WITHOUT AND WITH CONTRAST TECHNIQUE: Multiplanar, multiecho pulse sequences of the brain and surrounding structures were obtained without and with intravenous contrast. CONTRAST:  8 mL Vueway  COMPARISON:  Head MRI 10/26/2022 FINDINGS: Brain: There is no evidence of an acute infarct, intracranial hemorrhage, mass, midline  shift, or extra-axial fluid collection. Cerebral volume is within normal limits for age. The ventricles are normal in size. T2 hyperintensities in the juxtacortical, periventricular, and deep cerebral white matter bilaterally and in the pons are unchanged and moderately advanced for age. None show enhancement or restricted diffusion. Mild T2 heterogeneity in the thalami and at most  minimal in the cerebellum are unchanged. Vascular: Major intracranial vascular flow voids are preserved. Skull and upper cervical spine: Unremarkable bone marrow signal. Sinuses/Orbits: Unremarkable orbits. Paranasal sinuses and mastoid air cells are clear. Other: None. IMPRESSION: Unchanged moderately advanced white matter disease, nonspecific but may reflect a combination chronic demyelinating disease and chronic small vessel ischemia. No evidence of active demyelination. Electronically Signed   By: Dasie Hamburg M.D.   On: 02/18/2024 08:06      03/29/2024 Imaging   US  Venous Img Upper Uni Left (DVT) Result Date: 04/01/2024 CLINICAL DATA:  LEFT upper extremity pain. History of tobacco use and ovarian malignancy. EXAM: LEFT UPPER EXTREMITY VENOUS DOPPLER ULTRASOUND TECHNIQUE: Gray-scale sonography with graded compression, as well as color Doppler and duplex ultrasound were performed to evaluate the upper extremity deep venous system from the level of the subclavian vein and including the jugular, axillary, basilic, radial, ulnar and upper cephalic vein. Spectral Doppler was utilized to evaluate flow at rest and with distal augmentation maneuvers. COMPARISON:  None available FINDINGS: Internal Jugular Vein: No evidence of thrombus. Normal compressibility, respiratory phasicity and response to augmentation. Subclavian Vein: No evidence of thrombus. Normal compressibility, respiratory phasicity and response to augmentation. Axillary Vein: No evidence of thrombus. Normal compressibility, respiratory phasicity and response to  augmentation. Cephalic Vein: Intramural filling defect with decreased flow and compressibility of the cephalic vein and adjacent varicose vein at the level of the mid upper arm is consistent with acute DVT. This also corresponds to the patient's palpable abnormality. Basilic Vein: No evidence of thrombus. Normal compressibility, respiratory phasicity and response to augmentation. Brachial Veins: No evidence of thrombus. Normal compressibility, respiratory phasicity and response to augmentation. Radial Veins: No evidence of thrombus. Normal compressibility, respiratory phasicity and response to augmentation. Ulnar Veins: No evidence of thrombus. Normal compressibility, respiratory phasicity and response to augmentation. Venous Reflux:  None visualized. Other Findings:  None visualized. IMPRESSION: 1. No DVT of the LEFT upper extremity. 2. Acute superficial venous thrombosis of the cephalic vein and adjacent varicose vein at the level of the mid upper arm. Electronically Signed   By: Aliene Lloyd M.D.   On: 04/01/2024 07:35   CT ANGIO GI BLEED Result Date: 03/29/2024 CLINICAL DATA:  Lightheadedness, dark stool for 1 month, epigastric pain for several days, history of ovarian cancer EXAM: CTA ABDOMEN AND PELVIS WITHOUT AND WITH CONTRAST TECHNIQUE: Multidetector CT imaging of the abdomen and pelvis was performed using the standard protocol during bolus administration of intravenous contrast. Multiplanar reconstructed images and MIPs were obtained and reviewed to evaluate the vascular anatomy. RADIATION DOSE REDUCTION: This exam was performed according to the departmental dose-optimization program which includes automated exposure control, adjustment of the mA and/or kV according to patient size and/or use of iterative reconstruction technique. CONTRAST:  OMNIPAQUE  IOHEXOL  350 MG/ML SOLN COMPARISON:  02/22/2024 FINDINGS: VASCULAR Aorta: Normal caliber aorta without aneurysm, dissection, vasculitis or  significant stenosis. Mild atherosclerosis. Celiac: Patent without evidence of aneurysm, dissection, vasculitis or significant stenosis. SMA: Patent without evidence of aneurysm, dissection, vasculitis or significant stenosis. Renals: Both renal arteries are patent without evidence of aneurysm, dissection, vasculitis, fibromuscular dysplasia or significant stenosis. IMA: Patent without evidence of aneurysm, dissection, vasculitis or significant stenosis. Inflow: Patent without evidence of aneurysm, dissection, vasculitis or significant stenosis. Evaluation is slightly limited due to streak artifact from lumbar orthopedic hardware. Proximal Outflow: Bilateral common femoral and visualized portions of the superficial and profunda femoral arteries are patent without evidence of aneurysm, dissection, vasculitis or  significant stenosis. Veins: Continue mass effect upon the portal vein confluence due to the large mass between the duodenum and pancreatic head. No other acute venous abnormalities. Review of the MIP images confirms the above findings. NON-VASCULAR Lower chest: Pericardiophrenic lymph node is again identified, measuring 2.4 x 2.2 cm, previously 2.5 x 2.0 cm. No acute airspace disease. Hepatobiliary: No focal liver abnormality is seen. No gallstones, gallbladder wall thickening, or biliary dilatation. Pancreas: Unremarkable. No pancreatic ductal dilatation or surrounding inflammatory changes. Spleen: Normal in size without focal abnormality. Adrenals/Urinary Tract: Kidneys enhance normally. No urinary tract calculi. 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 No left-sided obstruction. 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 No specific follow-up is required. Left adrenal is unremarkable. Bladder is normal. Stomach/Bowel: No bowel obstruction or ileus. Prior sigmoid colon resection and reanastomosis. Previous appendectomy. There  is a large necrotic 7.8 x 8.7 x 8.1 cm mass interposed between the head of the pancreas and duodenal ball. This mass displaces and likely invades the wall of the distal stomach and proximal duodenum, and may be the source for chronic blood loss. However, on today's exam, there is no evidence of contrast accumulation within the bowel lumen to suggest active hemorrhage. Lymphatic: Previous index enlarged lymph node in the right external iliac chain now measures 12 mm in short axis reference image 62/13, previously 9 mm. Stable right external iliac/inguinal lymph node image 69/13 measuring 6 mm in short axis. No new adenopathy is identified. Reproductive: Stable postsurgical changes from hysterectomy. Other: No free fluid or free intraperitoneal gas. Enlarged para incisional hernia in the right supraumbilical region, now containing a short segment of small bowel. No evidence of incarceration or obstruction. Musculoskeletal: Stable postsurgical changes within the lower lumbar spine. There are no acute or destructive bony abnormalities. Reconstructed images demonstrate no additional findings. IMPRESSION: VASCULAR 1. No evidence of active gastrointestinal bleeding. 2.  Aortic Atherosclerosis (ICD10-I70.0). NON-VASCULAR 1. Enlarging necrotic soft tissue mass interposed between the head of the pancreas and proximal duodenum. There is likely invasion of the dorsal wall of the distal stomach and duodenum, and this could be a source for chronic blood loss given clinical history. There is no evidence of active hemorrhage on today's exam. 2. Progressive adenopathy within the pericardiophrenic region and right external iliac chain consistent with worsening metastatic disease. 3. Para incisional hernia in the right supraumbilical region, containing a short segment of small bowel. No incarceration or obstruction. Electronically Signed   By: Ozell Daring M.D.   On: 03/29/2024 16:19   DG Chest 2 View Result Date:  03/29/2024 CLINICAL DATA:  Chest pain. EXAM: CHEST - 2 VIEW COMPARISON:  Chest CT dated 09/19/2022. FINDINGS: Right-sided Port-A-Cath with tip over central SVC. No focal consolidation, pleural effusion, pneumothorax. The cardiac silhouette is within normal limits. No acute osseous pathology. Degenerative changes of the shoulders. IMPRESSION: No active cardiopulmonary disease. Electronically Signed   By: Vanetta Chou M.D.   On: 03/29/2024 13:01      06/17/2024 -  Chemotherapy   Patient is on Treatment Plan : OVARIAN Liposomal Doxorubicin (40) q28d       ALLERGIES:  is allergic to cymbalta [duloxetine hcl], cyclobenzaprine hcl, celexa  [citalopram hydrobromide], fluticasone  furoate-vilanterol, gabapentin, opana  [oxymorphone hcl], and tramadol.  MEDICATIONS:  Current Outpatient Medications  Medication Sig Dispense Refill   albuterol  (VENTOLIN  HFA) 108 (90 Base) MCG/ACT inhaler Inhale 2 puffs into the lungs every 6 (six) hours as needed for  shortness of breath.     amphetamine -dextroamphetamine  (ADDERALL ) 20 MG tablet Take 10-20 mg by mouth See admin instructions. Take 20 mg by mouth in the morning and 10 mg between 1-2 PM daily     Ascorbic Acid (VITAMIN C) 1000 MG tablet Take 1,000 mg by mouth daily.     ASHWAGANDHA PO Take 1 capsule by mouth daily.     baclofen  (LIORESAL ) 10 MG tablet Take 10 mg by mouth 3 (three) times daily as needed.     BLACK CURRANT SEED OIL PO Take 1 capsule by mouth daily.     dexamethasone  (DECADRON ) 4 MG tablet Take 1 tablet (4 mg total) by mouth daily. 30 tablet 6   hydroxychloroquine (PLAQUENIL) 200 MG tablet Take 200 mg by mouth 2 (two) times daily.     lidocaine -prilocaine  (EMLA ) cream Apply to affected area once 30 g 3   LORazepam  (ATIVAN ) 0.5 MG tablet Take 0.5 mg by mouth 3 (three) times daily as needed for anxiety.     mirtazapine  (REMERON ) 7.5 MG tablet Take 7.5 mg by mouth at bedtime.     Multiple Vitamin (MULTIVITAMIN WITH MINERALS) TABS tablet Take 1  tablet by mouth daily.     naloxone  (NARCAN ) nasal spray 4 mg/0.1 mL Place 1 spray into the nose as needed (opioid overdose).     omeprazole  (PRILOSEC) 20 MG capsule TAKE 1 CAPSULE BY MOUTH EVERY DAY 90 capsule 1   ondansetron  (ZOFRAN ) 8 MG tablet Take 1 tablet (8 mg total) by mouth every 8 (eight) hours as needed for nausea or vomiting. 30 tablet 1   OVER THE COUNTER MEDICATION Take 2 capsules by mouth daily. [place] advanced supplement     oxyCODONE  ER (XTAMPZA  ER) 13.5 MG C12A Take 13.5 mg by mouth in the morning, at noon, and at bedtime.     Oxycodone  HCl 20 MG TABS Take 20 mg by mouth 5 (five) times daily as needed (pain).     polyethylene glycol (MIRALAX  / GLYCOLAX ) 17 g packet Take 17 g by mouth daily.     prochlorperazine  (COMPAZINE ) 10 MG tablet Take 1 tablet (10 mg total) by mouth every 6 (six) hours as needed for nausea or vomiting. 30 tablet 1   Vitamin D, Ergocalciferol, (DRISDOL) 1.25 MG (50000 UNIT) CAPS capsule Take 50,000 Units by mouth every Sunday.     WIXELA INHUB 250-50 MCG/ACT AEPB Inhale 1 puff into the lungs daily.     No current facility-administered medications for this visit.    VITAL SIGNS: There were no vitals taken for this visit. There were no vitals filed for this visit.   Estimated body mass index is 21.41 kg/m as calculated from the following:   Height as of an earlier encounter on 06/04/24: 5' 9 (1.753 m).   Weight as of an earlier encounter on 06/04/24: 145 lb (65.8 kg).  LABS: CBC:    Component Value Date/Time   WBC 7.5 06/04/2024 1250   WBC 7.0 05/09/2024 1227   HGB 5.9 (LL) 06/04/2024 1250   HCT 18.6 (L) 06/04/2024 1250   PLT 264 06/04/2024 1250   MCV 86.9 06/04/2024 1250   NEUTROABS 5.4 06/04/2024 1250   LYMPHSABS 1.2 06/04/2024 1250   MONOABS 0.8 06/04/2024 1250   EOSABS 0.0 06/04/2024 1250   BASOSABS 0.0 06/04/2024 1250   Comprehensive Metabolic Panel:    Component Value Date/Time   NA 134 (L) 06/04/2024 1414   K 3.7 06/04/2024  1414   CL 98 06/04/2024 1414  CO2 26 06/04/2024 1414   BUN 12 06/04/2024 1414   CREATININE 0.85 06/04/2024 1414   CREATININE 0.84 05/20/2024 1413   GLUCOSE 118 (H) 06/04/2024 1414   CALCIUM 7.8 (L) 06/04/2024 1414   AST 43 (H) 06/04/2024 1414   AST 21 05/20/2024 1413   ALT 19 06/04/2024 1414   ALT 10 05/20/2024 1413   ALKPHOS 93 06/04/2024 1414   BILITOT 0.5 06/04/2024 1414   BILITOT 0.3 05/20/2024 1413   PROT 5.9 (L) 06/04/2024 1414   ALBUMIN  2.8 (L) 06/04/2024 1414    RADIOGRAPHIC STUDIES: DG Chest Portable 1 View Result Date: 06/04/2024 EXAM: 1 VIEW(S) XRAY OF THE CHEST 06/04/2024 04:19:00 PM COMPARISON: 05/09/2024 CLINICAL HISTORY: Fever. FINDINGS: LINES, TUBES AND DEVICES: Right chest power injectable port in place with tip at the lower SVC. LUNGS AND PLEURA: Panlike density favoring atelectasis along the left hemidiaphragm. No pleural effusion. No pneumothorax. HEART AND MEDIASTINUM: No acute abnormality of the cardiac and mediastinal silhouettes. BONES AND SOFT TISSUES: No acute osseous abnormality. JOINTS: Mild degenerative glenohumeral arthropathy bilaterally. IMPRESSION: 1. Mild left basilar atelectasis along the left hemidiaphragm. 2. Right chest power-injectable port with tip in the lower SVC. 3. Mild bilateral glenohumeral degenerative arthropathy. Electronically signed by: Ryan Salvage MD 06/04/2024 04:52 PM EST RP Workstation: [id number]   IR Radiologist Eval & Mgmt Result Date: 05/27/2024 EXAM: NEW PATIENT OFFICE VISIT CHIEF COMPLAINT: See below HISTORY OF PRESENT ILLNESS: See below REVIEW OF SYSTEMS: See below PHYSICAL EXAMINATION: See below ASSESSMENT AND PLAN: Please refer to completed note in the electronic medical record on [facility] Thom Hall, MD [facility] Electronically Signed   By: Thom Hall M.D.   On: 05/27/2024 09:02   DG Chest 2 View Result Date: 05/09/2024 CLINICAL DATA:  Chest pain  EXAM: CHEST - 2 VIEW COMPARISON:  March 29, 2024 FINDINGS: The heart size and mediastinal contours are within normal limits. Stable right internal jugular Port-A-Cath. Both lungs are clear. The visualized skeletal structures are unremarkable. IMPRESSION: No active cardiopulmonary disease. Electronically Signed   By: Lynwood Landy Raddle M.D.   On: 05/09/2024 13:59    PERFORMANCE STATUS (ECOG) : 1 - Symptomatic but completely ambulatory  Review of Systems Unless otherwise noted, a complete review of systems is negative.  Physical Exam General: NAD Cardiovascular: regular rate and rhythm Pulmonary: clear ant fields Abdomen: soft, nontender, + bowel sounds GU: no suprapubic tenderness Extremities: no edema, no joint deformities Skin: no rashes Neurological: Weakness but otherwise nonfocal  IMPRESSION: Patient recently hospitalized with GI bleed from known mass invading the small bowel.  She was discharged home with plan to initiate hospice.  However, she transferred care to our cancer center from [facility] with plan for ongoing supportive care/transfusion.  Patient was previously referred to IR for consideration of embolization of fungating duodenal mass.  However, patient canceled appointment due to weakness and fatigue.  This has subsequently been rescheduled and patient reports IR plans to repeat CT.  Patient presents to clinic with severe low back pain.  Also having moderate abdominal pain.  Patient states that pain has been worse since she ran out of her oxycodone  over the weekend.  She has reached out to her neurologist who is refilled oxycodone  the patient has yet to pick it up from the pharmacy.  Patient reports that pain is generally better controlled on her existing pain regimen.  PDMP reviewed.  Patient has been Xtampza  ER 13.5 mg 3 times daily and oxycodone   20 mg 5 times daily for many months.  This has been prescribed by [facility] neurology -[facility] office.  Xtampza  was last  filled on 05/09/2024 and oxycodone  last filled on 04/29/2024.  Will give dose of oxycodone  in clinic today to bridge until patient can get to pharmacy to pick up her existing prescription.  Patient denies bleeding.  No other symptomatic complaints or concerns.  Patient receiving weekly Venofer .  Will go ahead and check H&H today for consideration of transfusion.  PLAN: - Continue current scope of treatment - Supportive care with transfusions - Continue Xtampza /oxycodone  - Oxycodone  20 mg x 1 in clinic - Check H&H today - Oral nutritional supplements (samples provided) - Community palliative care is following - Venofer  and possible transfusion later this week - RTC 2 weeks  Case and plan discussed with Dr. Babara  Patient expressed understanding and was in agreement with this plan. She also understands that She can call the clinic at any time with any questions, concerns, or complaints.     Time Total: 20 minutes  Visit consisted of counseling and education dealing with the complex and emotionally intense issues of symptom management and palliative care in the setting of serious and potentially life-threatening illness.Greater than 50%  of this time was spent counseling and coordinating care related to the above assessment and plan.  Signed by: Fonda Mower, PhD, NP-C   "

## 2024-06-04 NOTE — ED Provider Notes (Signed)
 "  [facility] Provider Note    Event Date/Time   First MD Initiated Contact with Patient 06/04/24 1508     (approximate)  History   Chief Complaint: abnormal labs  HPI  [name] is a 69 y.o. female with a past medical history of anxiety, anemia, gastric reflux, MS, presents to the emergency department referred by hematology for blood transfusion.  According to the patient she went to her hematologist today they checked labs and noted that her blood count was down to 5.9 hemoglobin.  They refer the patient to the emergency department for further evaluation.  Patient also states when she went to oncology she had a borderline low-grade temperature of 100.0 however states they rechecked it and it was normal and when they checked it here it was normal 98.8.  I checked it myself while in the room evaluating the patient it was 98.5 orally.  Patient denies any cough congestion denies any known fever states she did notice some darker urine yesterday but no dysuria.  Patient states she has a known slow GI bleed she has had to come to the emergency department several times for blood transfusions but usually can go home following the transfusion.  Physical Exam   Triage Vital Signs: ED Triage Vitals  Encounter Vitals Group     BP 06/04/24 1410 100/71     Girls Systolic BP Percentile --      Girls Diastolic BP Percentile --      Boys Systolic BP Percentile --      Boys Diastolic BP Percentile --      Pulse Rate 06/04/24 1410 99     Resp 06/04/24 1410 20     Temp 06/04/24 1410 98.8 F (37.1 C)     Temp src --      SpO2 06/04/24 1410 95 %     Weight 06/04/24 1411 145 lb (65.8 kg)     Height 06/04/24 1411 5' 9 (1.753 m)     Head Circumference --      Peak Flow --      Pain Score 06/04/24 1411 8     Pain Loc --      Pain Education --      Exclude from Growth Chart --     Most recent vital signs: Vitals:   06/04/24 1410  BP: 100/71  Pulse: 99  Resp: 20   Temp: 98.8 F (37.1 C)  SpO2: 95%    General: Awake, no distress.  CV:  Good peripheral perfusion.  Regular rate and rhythm  Resp:  Normal effort.  Equal breath sounds bilaterally.  Abd:  No distention.  Soft, nontender.  No rebound or guarding.  ED Results / Procedures / Treatments   RADIOLOGY  I have reviewed and interpreted the chest x-ray images.  No consolidation seen on my evaluation.  Right sided port present.   MEDICATIONS ORDERED IN ED: Medications  0.9 %  sodium chloride  infusion (has no administration in time range)     IMPRESSION / MDM / ASSESSMENT AND PLAN / ED COURSE  I reviewed the triage vital signs and the nursing notes.  Patient's presentation is most consistent with acute presentation with potential threat to life or bodily function.  Patient presents emergency department for low blood counts sent for blood transfusion.  Patient also states initially her temperature was 100.0 she denies being on any treatments or chemotherapy she is only on iron  supplementation/infusions.  I spoke with Dr. Babara  who is her treating hematologist/oncologist.  We will obtain a chest x-ray urinalysis and COVID/flu swab as precaution.  If the patient's infectious workup shows no significant bacterial finding and the patient could be discharged home after transfusion of 2 units of packed red blood cells per Dr. Babara.  I have ordered the transfusion for the patient.  We will obtain additional lab work including a urinalysis COVID/flu swab and chest x-ray.  Patient agreeable to plan of care workup.  Patient's urinalysis shows no significant finding a urine culture has been added on as a precaution but I do not believe patient requires treatment with antibiotics.  Patient will be discharged home following completion of her second transfused unit.  Remainder the patient's lab work in the emergency department has been reassuring with a negative respiratory panel reassuring chemistry.  Chest x-ray  is clear.  CRITICAL CARE Performed by: Franky Moores   Total critical care time: 30 minutes  Critical care time was exclusive of separately billable procedures and treating other patients.  Critical care was necessary to treat or prevent imminent or life-threatening deterioration.  Critical care was time spent personally by me on the following activities: development of treatment plan with patient and/or surrogate as well as nursing, discussions with consultants, evaluation of patient's response to treatment, examination of patient, obtaining history from patient or surrogate, ordering and performing treatments and interventions, ordering and review of laboratory studies, ordering and review of radiographic studies, pulse oximetry and re-evaluation of patient's condition.   FINAL CLINICAL IMPRESSION(S) / ED DIAGNOSES   Anemia   Note:  This document was prepared using Dragon voice recognition software and may include unintentional dictation errors.   Moores Franky, MD 06/04/24 2024  "

## 2024-06-04 NOTE — Progress Notes (Unsigned)
 "  [facility]  [facility] A Department of the [facility]. [facility] [address] [place], [place] [address] [phone] (phone) [phone] (fax)  Patient Care Team: Arloa Elsie SAUNDERS, MD as PCP - General (Family Medicine) Martinez, Sarah A, PA-C (Physician Assistant) Maurie Rayfield BIRCH, RN as Oncology Nurse Navigator Babara Call, MD as Consulting Physician (Oncology)   Name of the patient: [name]  [id number]  1956/01/30   Date of visit: 06/04/24  Diagnosis-high-grade serous ovarian cancer  Chief complaint/ Reason for visit-generalized weakness  Heme/Onc history:  Oncology History Overview Note  High grade serous, p53 mutated Neg genetics, HRD not detected, MSI stable, low TMB 2, progressed on carboplatin  and gemcitabine , paclitaxel    Malignant neoplasm of both ovaries (HCC)  04/11/2022 Imaging   CT of the abdomen and pelvis on 04/11/2022 reveals a left adnexal masslike area measuring 5.9 x 3.6 cm.  Margins of this mass are irregular.  There is small fluid in the cul-de-sac as well as fullness of soft tissue about the right adnexa.  Discrete uterine structure is not identified.  Left upper quadrant nodules beneath the left hemidiaphragm (1 anterior to the spleen and the other to the stomach).  Omental/peritoneal nodularity and multiple perihepatic nodules are noted    04/18/2022 Tumor Marker   Patient's tumor was tested for the following markers: CA-125. Results of the tumor marker test revealed 123.   04/19/2022 Initial Diagnosis   Carcinomatosis (HCC)   04/28/2022 Imaging   1. Bulky bilateral ovarian masses and nodules, left-greater-than-right, which are essentially confluent with adjacent pelvic soft tissue nodularity and not significantly changed compared to prior examination dated 04/11/2022. 2. Extensive pelvic peritoneal thickening and nodularity. Multiple small peritoneal nodules throughout the  abdomen and pelvis. Findings are consistent with peritoneal metastatic disease and likewise not significantly changed. 3. Small volume of loculated appearing fluid in the low pelvis. 4. 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 The mid to distal right ureter is obstructed by right ovarian mass and or soft tissue nodularity. 5. Calculus within the most distal left ureterovesicular junction or just within the bladder lumen measuring 0.7 cm, unchanged. No associated left-sided hydronephrosis. 6. Soft tissue attenuation nodule of the body of the right adrenal gland. Notably, this was also soft tissue attenuation on prior noncontrast examination (i.e. not definitively macroscopic fat containing) although unchanged compared to prior examinations dating back to 2018 and almost certainly a benign adenoma. Attention on follow-up. 7. No evidence of metastatic disease in the chest. 8. Mild diffuse bilateral bronchial wall thickening. Background of very fine centrilobular nodularity, most concentrated in the lung apices. Findings are most consistent with smoking-related respiratory bronchiolitis.     05/18/2022 Procedure   Ultrasound-guided core biopsy performed of a 10 mm soft tissue peritoneal mass in the right upper quadrant just superficial to the right lobe of the liver. The procedure was performed under general anesthesia immediately following Port-A-Cath placement.   05/18/2022 Procedure   Placement of single lumen port a cath via right internal jugular vein. The catheter tip lies at the cavo-atrial junction. A power injectable port a cath was placed and is ready for immediate use.     05/18/2022 Pathology Results   A. PERITONEAL MASS, RIGHT, BIOPSY:  - Metastatic high grade serous carcinoma (see comment)   COMMENT:   Appropriately controlled immunohistochemical stains reveal tumor cells are positive  for  PAX8, WT1 and p53.  The findings support the above interpretation.  This case was reviewed with Dr. Rebbecca who agrees with the above diagnosis.  A p16 stain is pending and will be reported in an addendum.    05/23/2022 Cancer Staging   Staging form: Ovary, Fallopian Tube, and Primary Peritoneal Carcinoma, AJCC 8th Edition - Clinical stage from 05/23/2022: FIGO Stage IIIC (cT3c, cN0, cM0) - Signed by Lonn Hicks, MD on 05/23/2022 Stage prefix: Initial diagnosis   05/29/2022 Imaging   1. Complex bilateral adnexal masses, as described above, consistent with the patient's known malignancy. 2. Subsequent marked severity mass effect on the distal bilateral common iliac veins, increased in severity when compared to the prior exam. 3. Additional findings consistent with peritoneal metastasis within the pelvis. 4. Findings consistent with pelvic congestion syndrome. 5. Postoperative changes within the mid and lower lumbar spine. 6. Aortic atherosclerosis. 7. No evidence of venous thrombus within the abdomen, pelvis or proximal bilateral lower extremities.   Aortic Atherosclerosis (ICD10-I70.0).   06/02/2022 - 11/03/2022 Chemotherapy   Patient is on Treatment Plan : OVARIAN Carboplatin  (AUC 6) + Paclitaxel  (175) q21d X 6 Cycles     06/26/2022 Tumor Marker   Patient's tumor was tested for the following markers: CA-125. Results of the tumor marker test revealed 134.   08/04/2022 Imaging   CT ABDOMEN PELVIS W CONTRAST  Result Date: 08/04/2022 CLINICAL DATA:  Ovarian cancer, chemotherapy in progress, for restaging EXAM: CT ABDOMEN AND PELVIS WITH CONTRAST TECHNIQUE: Multidetector CT imaging of the abdomen and pelvis was performed using the standard protocol following bolus administration of intravenous contrast. RADIATION DOSE REDUCTION: This exam was performed according to the departmental dose-optimization program which includes automated exposure control, adjustment of the mA and/or kV according to  patient size and/or use of iterative reconstruction technique. CONTRAST:  OMNIPAQUE  IOHEXOL  300 MG/ML  SOLN COMPARISON:  CT venogram abdomen/pelvis dated 05/29/2022. CT abdomen/pelvis dated 04/26/2022. FINDINGS: Lower chest: Lung bases are clear. Hepatobiliary: Liver is within normal limits. Gallbladder is unremarkable. No intrahepatic or extrahepatic ductal dilatation. Pancreas: Within normal limits. Spleen: Within normal limits. Adrenals/Urinary Tract: 2.4 cm right adrenal nodule (series 2/image 17), previously 2.0 cm in 2018, likely reflecting a benign adrenal adenoma. Left adrenal gland is within normal limits. Left kidney is within normal limits. 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 Associated extrinsic compression at the level of the right adnexal mass (described below). Mildly thick-walled bladder, although underdistended. Stable 6 mm left UVJ calcification (series 2/image 68). Stomach/Bowel: Stomach is within normal limits. No evidence of bowel obstruction. Normal appendix (series 2/image 47). Left adnexal mass (described below) is favored to abut and directly invade the left lateral wall of the sigmoid colon (series 2/image 57). Vascular/Lymphatic: No evidence of abdominal aortic aneurysm. Atherosclerotic calcifications of the abdominal aorta and branch vessels. No suspicious abdominopelvic lymphadenopathy. Reproductive: Heterogeneous uterus with associated surface nodularity (series 2/image 65), likely reflecting peritoneal disease. This appearance is improved from the prior. 3.9 x 3.3 cm left adnexal mass (series 2/image 60), previously 5.3 x 3.9 cm, with extension along the sigmoid mesocolon (series 2/image 56) and suspected involvement of the sigmoid colon (series 2/image 57). 5.6 x 2.7 cm mixed cystic/solid right adnexal mass (series 2/image 89), previously 6.9 x 4.3 cm when measured in a similar  fashion. Other: No abdominopelvic ascites. Scattered minimal peritoneal nodularity beneath the anterior abdominal wall, including a 5 mm nodule beneath the left anterior  abdominal wall (series 2/image 27) which previously measured 12 mm. Additional mild nodularity on series 2/images 32, 33, and 50. Musculoskeletal: Status post PLIF at L4-S1. Status post lateral fixation at L4-5. Status post anterior fixation at L5-S1. Mild degenerative changes of the lower thoracic spine. IMPRESSION: Improving bilateral adnexa masses in this patient with known ovarian cancer, as above. Suspected involvement of the sigmoid colon. No evidence of bowel obstruction. Improving peritoneal nodularity, as above. Additional ancillary findings as above. Electronically Signed   By: Pinkie Pebbles M.D.   On: 08/04/2022 01:27      08/16/2022 Pathology Results   CASE: [id number] PATIENT: BARBEE MACE Surgical Pathology Report  Clinical History: Advanced gyn malignancy (crm)  FINAL MICROSCOPIC DIAGNOSIS:  A. OMENTUM, RESECTION: - Positive for carcinoma  B. LIVER ADHESION, EXCISION: - Benign fibroadipose tissue - Negative for carcinoma  C. SMALL BOWEL MESENTERIC IMPLANT, EXCISION: - Positive for carcinoma with extensive necrosis  D. UTERUS, CERVIX, BILATERAL FALLOPIAN TUBES AND OVARIES, AND APPENDIX, RESECTION: - Uterine serosa: Tumor deposits present - Cervix: Benign, nabothian cyst - Endometrium: Benign inactive endometrium, benign endometrial polyp - Myometrium: Leiomyoma - Right fallopian tube: Metastatic tumor deposits present - Right ovary: High-grade serous carcinoma - Left ovary: High-grade serous carcinoma - Benign appendix - See oncology table  E. COLON, RECTOSIGMOID, RESECTION: - Benign colonic mucosa - Margins free of carcinoma - Adherent fallopian tube with metastatic tumor deposits - 2 benign lymph nodes  F. RECTAL TUMOR IMPLANT, EXCISION: - Metastatic high-grade serous carcinoma   G.  COLON, RECTOSIGMOID ANASTOMOTIC RINGS: - Benign anastomotic donuts  ONCOLOGY TABLE:   OVARY or FALLOPIAN TUBE or PRIMARY PERITONEUM: Resection  Procedure: Total abdominal hysterectomy, bilateral salpingo-oophorectomy, appendectomy, rectosigmoid resection, total omentectomy, small bowel nodule resection Specimen Integrity: Intact Tumor Site: Bilateral Tumor Size: Right ovary: 3.9 cm, left ovary: Approximately 1.2 cm Histologic Type: High-grade serous carcinoma Histologic Grade: High-grade Ovarian Surface Involvement: Present Fallopian Tube Surface Involvement: Present Implants: Present, small bowel mesenteric and rectovaginal septum Lymphatic and/or Vascular Invasion: Not identified Other Tissue/ Organ Involvement: Fallopian tube, bilateral Largest Extrapelvic Peritoneal Focus: 1.4 cm, small bowel mesentery Peritoneal/Ascitic Fluid Involvement: Not applicable Chemotherapy Response Score (CRS): Not applicable, no known presurgical therapy Regional Lymph Nodes: Not applicable (no lymph nodes submitted or found) Distant Metastasis:      Distant Site(s) Involved: Not applicable Pathologic Stage Classification (pTNM, AJCC 8th Edition): pT3b, pN[not assigned] Ancillary Studies: Can be performed upon request Representative Tumor Block: D8 Comment(s): Tumor is staged as pT3 given the presence of extrapelvic peritoneal deposits on small bowel mesentery less than 2cm (1.4 cm) ([id number])    09/18/2022 Tumor Marker   Patient's tumor was tested for the following markers: CA-125. Results of the tumor marker test revealed 36.8.   10/15/2022 Genetic Testing   Negative [facility] Multi-Cancer +RNA Panel.  VUS in POLD1 at c.1322C>T (p.Thr441Met). Report date is 10/15/2022.   The Multi-Cancer + RNA Panel offered by [facility] includes sequencing and/or deletion/duplication analysis of the following 70 genes:  AIP*, ALK, APC*, ATM*, AXIN2*, BAP1*, BARD1*, BLM*, BMPR1A*, BRCA1*, BRCA2*, BRIP1*, CDC73*,  CDH1*, CDK4, CDKN1B*, CDKN2A, CHEK2*, CTNNA1*, DICER1*, EPCAM (del/dup only), EGFR, FH*, FLCN*, GREM1 (promoter dup only), HOXB13, KIT, LZTR1, MAX*, MBD4, MEN1*, MET, MITF, MLH1*, MSH2*, MSH3*, MSH6*, MUTYH*, NF1*, NF2*, NTHL1*, PALB2*, PDGFRA, PMS2*, POLD1*, POLE*, POT1*, PRKAR1A*, PTCH1*, PTEN*, RAD51C*, RAD51D*, RB1*, RET, SDHA* (sequencing only), SDHAF2*, SDHB*, SDHC*, SDHD*, SMAD4*, SMARCA4*, SMARCB1*, SMARCE1*, STK11*, SUFU*, TMEM127*, TP53*, TSC1*, TSC2*, VHL*. RNA analysis is performed for * genes.  11/06/2022 Tumor Marker   Patient's tumor was tested for the following markers: CA-125. Results of the tumor marker test revealed 16.8.   12/11/2022 Tumor Marker   Patient's tumor was tested for the following markers: CA-125. Results of the tumor marker test revealed 12.5.   12/14/2022 Imaging   CT ABDOMEN PELVIS W CONTRAST  Result Date: 12/13/2022 CLINICAL DATA:  Ovarian cancer restaging * Tracking Code: BO * EXAM: CT ABDOMEN AND PELVIS WITH CONTRAST TECHNIQUE: Multidetector CT imaging of the abdomen and pelvis was performed using the standard protocol following bolus administration of intravenous contrast. RADIATION DOSE REDUCTION: This exam was performed according to the departmental dose-optimization program which includes automated exposure control, adjustment of the mA and/or kV according to patient size and/or use of iterative reconstruction technique. CONTRAST:  OMNIPAQUE  IOHEXOL  300 MG/ML  SOLN COMPARISON:  08/02/2022 FINDINGS: Lower chest: Unremarkable Hepatobiliary: Unremarkable Pancreas: Unremarkable Spleen: Unremarkable Adrenals/Urinary Tract: 2.6 by 2.1 cm right adrenal mass, internal density 55 Hounsfield units on portal venous phase images, relative washout of 13% which is indeterminate. Back in 2016 I measure this lesion at 2.2 by 1.6 cm, on the more recent comparison of 07/18/2022 this lesion measured 2.6 by 2.1 cm similar to today. Accordingly this lesion is stable from March and  only very minimally increased in size from 2016, most likely benign. This can likely be surveilled in the context of the patient's ongoing oncology CT examinations. 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 The ureters transition to relatively normal caliber in the vicinity of the iliac vessel cross over, a specific cause for the transition is not identified. There is also a duplicated left renal collecting system without hydroureter on the left Stable 7 mm calcification or stone in the vicinity of the left ureterovesical junction. Stomach/Bowel: Prominent stool throughout the colon favors constipation. Anastomotic staple line in the sigmoid colon. No dilated small bowel. Interval appendectomy. Previous nodularity along bowel including the sigmoid colon no longer identified. Vascular/Lymphatic: Atherosclerosis is present, including aortoiliac atherosclerotic disease. No pathologic adenopathy. Reproductive: Interval hysterectomy with salpingo-oophorectomy. Other: Interval omentectomy. No current compelling findings of peritoneal tumor deposits. No ascites. Musculoskeletal: Prior fusion at L4-L5-S1. IMPRESSION: 1. Interval hysterectomy, omentectomy, and appendectomy. No compelling findings of recurrent or residual peritoneal tumor deposits. 2. 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 A specific cause for the transition in caliber is not identified. 3. Stable 7 mm calcification or stone in the vicinity of the left ureterovesical junction. Duplicated left renal collecting system. 4. Prominent stool throughout the colon favors constipation. 5. Chronically stable but otherwise nonspecific right adrenal mass, likely benign. 6. Aortic atherosclerosis.  Aortic Atherosclerosis (ICD10-I70.0). Electronically Signed   By: Ryan Salvage M.D.   On: 12/13/2022 15:16      12/20/2022 Procedure   Successful removal of a RIGHT chest implanted Port-A-Cath.    03/19/2023 Tumor Marker   Patient's tumor was tested for the following markers: CA-125. Results of the tumor marker test revealed 19.7.   06/14/2023 Imaging   CT ABDOMEN PELVIS W CONTRAST Result Date: 06/21/2023 CLINICAL DATA:  Restaging ovarian cancer.  * Tracking Code: BO * EXAM: CT ABDOMEN AND PELVIS WITH CONTRAST TECHNIQUE: Multidetector CT imaging of the abdomen and pelvis was performed using the standard protocol following bolus administration of  intravenous contrast. RADIATION DOSE REDUCTION: This exam was performed according to the departmental dose-optimization program which includes automated exposure control, adjustment of the mA and/or kV according to patient size and/or use of iterative reconstruction technique. CONTRAST:  OMNIPAQUE  IOHEXOL  300 MG/ML  SOLN COMPARISON:  CT abdomen and pelvis dated 12/08/2022 FINDINGS: Lower chest: No focal consolidation or pulmonary nodule in the lung bases. No pleural effusion or pneumothorax demonstrated. Partially imaged heart size is normal. Hepatobiliary: No focal hepatic lesions. No intra or extrahepatic biliary ductal dilation. Normal gallbladder. Pancreas: No focal lesions or main ductal dilation. Spleen: Normal in size without focal abnormality. Adrenals/Urinary Tract: Unchanged 2.0 cm right adrenal nodule measuring 43 HU (2:16). No left adrenal nodule. 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 Duplex left kidney. No hydronephrosis. Unchanged 7 mm calcification adjacent to the left ureterovesical junction. No focal bladder wall thickening. Stomach/Bowel: Normal appearance of the stomach. Postsurgical changes from rectosigmoid resection with patent anastomosis. No evidence of bowel wall thickening, distention, or inflammatory changes.  Appendectomy. Vascular/Lymphatic: Aortic atherosclerosis. New and increased size of multi station lymphadenopathy and peritoneal nodules, for example: -12 mm cardiophrenic (2:2), previously partially imaged at 3 mm -new 12 mm midline epigastric anterior peritoneal (2:23) -new 3.1 x 2.9 cm peripancreatic (2:25) -14 mm right pelvic sidewall (2:56), previously 4 mm -11 mm right external iliac (2:60), previously 7 mm Reproductive: Status post hysterectomy and bilateral salpingo-oophorectomy. No adnexal masses. Increased lobulated soft tissue density adjacent to the right vaginal cuff measuring 2.9 x 1.8 cm (2:64), previously 2.4 x 1.7 cm Other: Status post total omentectomy. No free fluid, fluid collection, or free air. Musculoskeletal: No acute or abnormal lytic or blastic osseous lesions. Postsurgical changes of L4-S1 spinal fixation. Hardware appears intact. Postsurgical changes of the anterior abdominal wall. IMPRESSION: 1. New and increased size of multi station lymphadenopathy and peritoneal nodules, consistent with metastatic disease. 2. Increased lobulated soft tissue density adjacent to the right vaginal cuff measuring 2.9 x 1.8 cm, previously 2.4 x 1.7 cm, suspicious for recurrent disease. 3. Unchanged 2.0 cm right adrenal nodule, favored benign. Recommend continued attention on follow-up. 4. 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 Duplex left kidney. No hydronephrosis. Unchanged 7 mm calcification adjacent to the left ureterovesical junction. 5.  Aortic Atherosclerosis (ICD10-I70.0). Electronically Signed   By: Limin  Xu M.D.   On: 06/21/2023 09:35      06/18/2023 Tumor Marker   Patient's tumor was tested for the following markers: CA-125. Results of the tumor marker test revealed 32.9.   07/03/2023 Procedure   Successful placement of a power injectable Port-A-Cath via the right internal jugular vein. The catheter is ready for immediate use.   07/04/2023 - 08/31/2023 Chemotherapy    Patient is on Treatment Plan : OVARIAN RECURRENT 3RD LINE Bevacizumab  D1 + Carboplatin  D1 + Gemcitabine  D1,8 (4/800) q21d     07/13/2023 Tumor Marker   Patient's tumor was tested for the following markers: CA-125. Results of the tumor marker test revealed 33.2.   08/17/2023 Tumor Marker   Patient's tumor was tested for the following markers: CA-125. Results of the tumor marker test revealed 16.1.   08/30/2023 Imaging   1. Status post hysterectomy and bilateral salpingo-oophorectomy. 2. Multistation adenopathy and peritoneal nodularity is again noted, compatible with metastatic disease. Mild increased size of peripancreatic node. Upper abdominal peritoneal nodule is slightly decreased in size in the interval. Additional index lymph nodes are stable in the interval. 3. Increased soft tissue density adjacent to the vaginal cuff,  concerning for locally recurrent disease. 4. Duplex right kidney with mild hydronephrosis and hydroureter, unchanged. Stone within the left UVJ is again noted measuring 0.8 x 0.4 cm, unchanged from previous exam. 5. Stable right adrenal nodule.  None 6. Supraumbilical ventral abdominal wall hernia containing a nonobstructed loop of small bowel. 7.  Aortic Atherosclerosis (ICD10-I70.0).       09/27/2023 - 11/15/2023 Chemotherapy   Patient is on Treatment Plan : OVARIAN Paclitaxel  (80) I8,1,84,77 q28d     09/28/2023 Tumor Marker   Patient's tumor was tested for the following markers: CA-125. Results of the tumor marker test revealed 29.8.   11/05/2023 Tumor Marker   Patient's tumor was tested for the following markers: CA-125. Results of the tumor marker test revealed 40.   11/16/2023 Tumor Marker   Patient's tumor was tested for the following markers: CA-125. Results of the tumor marker test revealed 32.3.   11/22/2023 Imaging   CT ABDOMEN PELVIS W CONTRAST Result Date: 11/28/2023 CLINICAL DATA:  Ovarian cancer.  * Tracking Code: BO * EXAM: CT ABDOMEN AND  PELVIS WITH CONTRAST TECHNIQUE: Multidetector CT imaging of the abdomen and pelvis was performed using the standard protocol following bolus administration of intravenous contrast. RADIATION DOSE REDUCTION: This exam was performed according to the departmental dose-optimization program which includes automated exposure control, adjustment of the mA and/or kV according to patient size and/or use of iterative reconstruction technique. CONTRAST:  OMNIPAQUE  IOHEXOL  300 MG/ML  SOLN COMPARISON:  08/30/2023. FINDINGS: Lower chest: Lung bases are clear. Prepericardiac lymph node measures 1.6 cm, enlarged from 1.1 cm on 08/30/2023. Heart is at the upper limits of normal in size. No pericardial or pleural effusion. Distal esophagus is grossly unremarkable. Hepatobiliary: Liver and gallbladder are unremarkable. No biliary ductal dilatation. Pancreas: Negative. Spleen: Negative. Adrenals/Urinary Tract: 2.4 cm right adrenal nodule, chronically stable and characterized previously 2 as an adenoma. No specific follow-up necessary. Minimal nodular thickening of the left adrenal gland, unchanged. Minimal scarring in the right kidney. Kidneys are otherwise unremarkable. Difficulty did right ureter appears to join in the midportion. Right ureter is decompressed. Left ureter may be minimally dilated secondary to an 8 mm stone at the left ureteral orifice, unchanged from 08/30/2023. Bladder is otherwise grossly unremarkable. Stomach/Bowel: Stomach, small bowel and colon are unremarkable. Appendix is not readily visualized. Vascular/Lymphatic: Atherosclerotic calcification of the aorta. Heterogeneous nodal mass or peritoneal implant between the distal stomach and pancreas measures 3.7 x 5.3 cm (2/29), enlarged from 3.0 x 3.7 cm. Right external iliac lymph node measures 7 mm (2/58), previously 12 mm. A second right external iliac lymph node measures 7 mm (2/64), stable. Reproductive: Hysterectomy. Similar asymmetric nodular soft  tissue along the right vaginal cuff (2/66-67). No adnexal mass. Other: Previously seen ventral midline epigastric peritoneal nodule is no longer visualized. No free fluid. Small incisional hernia contains fat along the ventral supraumbilical midline. Musculoskeletal: Osteopenia. Degenerative changes in the spine. L5-S1 fusion. IMPRESSION: 1. Enlarging prepericardiac nodal metastasis. Enlarging nodal mass or peritoneal implant between the distal stomach and pancreas. 2. Resolved ventral midline epigastric peritoneal metastasis. 3. Right external iliac lymph nodes are stable to smaller in size. 4. Asymmetric nodular thickening of the right vaginal cuff, stable. 5. Trace left ureteral dilatation with a chronic 8 mm stone at the left ureteral orifice. 6. Stable right adrenal nodule, previously characterized as an adenoma. 7.  Aortic atherosclerosis (ICD10-I70.0). Electronically Signed   By: Newell Eke M.D.   On: 11/28/2023 13:56  02/22/2024 Imaging   CT ABDOMEN PELVIS W CONTRAST Result Date: 02/24/2024 CLINICAL DATA:  Ovarian cancer, monitor, off chemotherapy. * Tracking Code: BO * EXAM: CT ABDOMEN AND PELVIS WITH CONTRAST TECHNIQUE: Multidetector CT imaging of the abdomen and pelvis was performed using the standard protocol following bolus administration of intravenous contrast. RADIATION DOSE REDUCTION: This exam was performed according to the departmental dose-optimization program which includes automated exposure control, adjustment of the mA and/or kV according to patient size and/or use of iterative reconstruction technique. CONTRAST:  OMNIPAQUE  IOHEXOL  300 MG/ML  SOLN COMPARISON:  Multiple priors including CT November 22, 2023 FINDINGS: Lower chest: No acute abnormality. Centrally necrotic pericardiophrenic lymph node measures 18 mm in short axis on image 3/2 previously 16 mm Hepatobiliary: No suspicious hepatic lesion. Gallbladder is unremarkable. No biliary ductal dilation. Pancreas: No  pancreatic ductal dilation or evidence of acute inflammation. Spleen: No splenomegaly. Adrenals/Urinary Tract: 2.1 cm right adrenal nodule on image 20/2 stable over multiple prior examinations and previously characterized as a benign adenoma. Left adrenal gland appears normal. 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 Similar prominence of the left ureter with 8 mm stone at the UVJ. Stomach/Bowel: Stomach is minimally distended limiting evaluation. No pathologic dilation of small or large bowel. No evidence of acute bowel inflammation. Vascular/Lymphatic: Normal caliber abdominal aorta. Smooth IVC contours. Narrowing of the portal vein and portal confluence by a heterogeneous enlarged lymph node/mass which measures 7.4 x 6.5 cm on image 27/2 previously 4.7 x 4.1 cm when remeasured for consistency, this sits between the pancreatic head and distal stomach. Previously indexed right external iliac lymph node measures 9 mm in short axis on image 58/2 previously 7 mm -additional right external iliac lymph node previously indexed measures 6 mm in short axis on image 66/2 previously 7 mm. Reproductive: Prior hysterectomy with similar asymmetric nodular soft tissue on the right side of the vaginal cuff on image 67/2. no suspicious adnexal mass. Other: No significant abdominopelvic free fluid. Postsurgical change in the anterior abdominal wall with fat containing incisional hernia. Musculoskeletal: No aggressive lytic or blastic lesion of bone. Spinal fusion hardware. Diffuse demineralization of bone. Thoracolumbar spondylosis. IMPRESSION: 1. Interval increase in size of the heterogeneous enlarged lymph node/mass between the pancreatic head and distal stomach. 2. Slight interval increase in size of the centrally necrotic pericardiophrenic lymph node. 3. Previously indexed right external iliac lymph nodes are similar to slightly increased in size. 4. Prior hysterectomy with  similar asymmetric nodular soft tissue on the right side of the vaginal cuff. 5. Similar prominence of the left ureter with 8 mm stone at the UVJ. 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 Electronically Signed   By: Reyes Holder M.D.   On: 02/24/2024 13:03   MR BRAIN W WO CONTRAST Result Date: 02/18/2024 CLINICAL DATA:  Multiple sclerosis. EXAM: MRI HEAD WITHOUT AND WITH CONTRAST TECHNIQUE: Multiplanar, multiecho pulse sequences of the brain and surrounding structures were obtained without and with intravenous contrast. CONTRAST:  8 mL Vueway  COMPARISON:  Head MRI 10/26/2022 FINDINGS: Brain: There is no evidence of an acute infarct, intracranial hemorrhage, mass, midline shift, or extra-axial fluid collection. Cerebral volume is within normal limits for age. The ventricles are normal in size. T2 hyperintensities in the juxtacortical, periventricular, and deep cerebral white matter bilaterally and in the pons are unchanged and moderately advanced for age. None show enhancement or restricted diffusion. Mild T2 heterogeneity in the thalami and at most  minimal in the cerebellum are unchanged. Vascular: Major intracranial vascular flow voids are preserved. Skull and upper cervical spine: Unremarkable bone marrow signal. Sinuses/Orbits: Unremarkable orbits. Paranasal sinuses and mastoid air cells are clear. Other: None. IMPRESSION: Unchanged moderately advanced white matter disease, nonspecific but may reflect a combination chronic demyelinating disease and chronic small vessel ischemia. No evidence of active demyelination. Electronically Signed   By: Dasie Hamburg M.D.   On: 02/18/2024 08:06      03/29/2024 Imaging   US  Venous Img Upper Uni Left (DVT) Result Date: 04/01/2024 CLINICAL DATA:  LEFT upper extremity pain. History of tobacco use and ovarian malignancy. EXAM: LEFT UPPER EXTREMITY VENOUS DOPPLER ULTRASOUND TECHNIQUE: Gray-scale sonography with graded  compression, as well as color Doppler and duplex ultrasound were performed to evaluate the upper extremity deep venous system from the level of the subclavian vein and including the jugular, axillary, basilic, radial, ulnar and upper cephalic vein. Spectral Doppler was utilized to evaluate flow at rest and with distal augmentation maneuvers. COMPARISON:  None available FINDINGS: Internal Jugular Vein: No evidence of thrombus. Normal compressibility, respiratory phasicity and response to augmentation. Subclavian Vein: No evidence of thrombus. Normal compressibility, respiratory phasicity and response to augmentation. Axillary Vein: No evidence of thrombus. Normal compressibility, respiratory phasicity and response to augmentation. Cephalic Vein: Intramural filling defect with decreased flow and compressibility of the cephalic vein and adjacent varicose vein at the level of the mid upper arm is consistent with acute DVT. This also corresponds to the patient's palpable abnormality. Basilic Vein: No evidence of thrombus. Normal compressibility, respiratory phasicity and response to augmentation. Brachial Veins: No evidence of thrombus. Normal compressibility, respiratory phasicity and response to augmentation. Radial Veins: No evidence of thrombus. Normal compressibility, respiratory phasicity and response to augmentation. Ulnar Veins: No evidence of thrombus. Normal compressibility, respiratory phasicity and response to augmentation. Venous Reflux:  None visualized. Other Findings:  None visualized. IMPRESSION: 1. No DVT of the LEFT upper extremity. 2. Acute superficial venous thrombosis of the cephalic vein and adjacent varicose vein at the level of the mid upper arm. Electronically Signed   By: Aliene Lloyd M.D.   On: 04/01/2024 07:35   CT ANGIO GI BLEED Result Date: 03/29/2024 CLINICAL DATA:  Lightheadedness, dark stool for 1 month, epigastric pain for several days, history of ovarian cancer EXAM: CTA ABDOMEN  AND PELVIS WITHOUT AND WITH CONTRAST TECHNIQUE: Multidetector CT imaging of the abdomen and pelvis was performed using the standard protocol during bolus administration of intravenous contrast. Multiplanar reconstructed images and MIPs were obtained and reviewed to evaluate the vascular anatomy. RADIATION DOSE REDUCTION: This exam was performed according to the departmental dose-optimization program which includes automated exposure control, adjustment of the mA and/or kV according to patient size and/or use of iterative reconstruction technique. CONTRAST:  OMNIPAQUE  IOHEXOL  350 MG/ML SOLN COMPARISON:  02/22/2024 FINDINGS: VASCULAR Aorta: Normal caliber aorta without aneurysm, dissection, vasculitis or significant stenosis. Mild atherosclerosis. Celiac: Patent without evidence of aneurysm, dissection, vasculitis or significant stenosis. SMA: Patent without evidence of aneurysm, dissection, vasculitis or significant stenosis. Renals: Both renal arteries are patent without evidence of aneurysm, dissection, vasculitis, fibromuscular dysplasia or significant stenosis. IMA: Patent without evidence of aneurysm, dissection, vasculitis or significant stenosis. Inflow: Patent without evidence of aneurysm, dissection, vasculitis or significant stenosis. Evaluation is slightly limited due to streak artifact from lumbar orthopedic hardware. Proximal Outflow: Bilateral common femoral and visualized portions of the superficial and profunda femoral arteries are patent without evidence of aneurysm, dissection, vasculitis or  significant stenosis. Veins: Continue mass effect upon the portal vein confluence due to the large mass between the duodenum and pancreatic head. No other acute venous abnormalities. Review of the MIP images confirms the above findings. NON-VASCULAR Lower chest: Pericardiophrenic lymph node is again identified, measuring 2.4 x 2.2 cm, previously 2.5 x 2.0 cm. No acute airspace disease. Hepatobiliary: No  focal liver abnormality is seen. No gallstones, gallbladder wall thickening, or biliary dilatation. Pancreas: Unremarkable. No pancreatic ductal dilatation or surrounding inflammatory changes. Spleen: Normal in size without focal abnormality. Adrenals/Urinary Tract: Kidneys enhance normally. No urinary tract calculi. 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 No left-sided obstruction. 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 No specific follow-up is required. Left adrenal is unremarkable. Bladder is normal. Stomach/Bowel: No bowel obstruction or ileus. Prior sigmoid colon resection and reanastomosis. Previous appendectomy. There is a large necrotic 7.8 x 8.7 x 8.1 cm mass interposed between the head of the pancreas and duodenal ball. This mass displaces and likely invades the wall of the distal stomach and proximal duodenum, and may be the source for chronic blood loss. However, on today's exam, there is no evidence of contrast accumulation within the bowel lumen to suggest active hemorrhage. Lymphatic: Previous index enlarged lymph node in the right external iliac chain now measures 12 mm in short axis reference image 62/13, previously 9 mm. Stable right external iliac/inguinal lymph node image 69/13 measuring 6 mm in short axis. No new adenopathy is identified. Reproductive: Stable postsurgical changes from hysterectomy. Other: No free fluid or free intraperitoneal gas. Enlarged para incisional hernia in the right supraumbilical region, now containing a short segment of small bowel. No evidence of incarceration or obstruction. Musculoskeletal: Stable postsurgical changes within the lower lumbar spine. There are no acute or destructive bony abnormalities. Reconstructed images demonstrate no additional findings. IMPRESSION: VASCULAR 1. No evidence of active gastrointestinal bleeding. 2.  Aortic Atherosclerosis (ICD10-I70.0).  NON-VASCULAR 1. Enlarging necrotic soft tissue mass interposed between the head of the pancreas and proximal duodenum. There is likely invasion of the dorsal wall of the distal stomach and duodenum, and this could be a source for chronic blood loss given clinical history. There is no evidence of active hemorrhage on today's exam. 2. Progressive adenopathy within the pericardiophrenic region and right external iliac chain consistent with worsening metastatic disease. 3. Para incisional hernia in the right supraumbilical region, containing a short segment of small bowel. No incarceration or obstruction. Electronically Signed   By: Ozell Daring M.D.   On: 03/29/2024 16:19   DG Chest 2 View Result Date: 03/29/2024 CLINICAL DATA:  Chest pain. EXAM: CHEST - 2 VIEW COMPARISON:  Chest CT dated 09/19/2022. FINDINGS: Right-sided Port-A-Cath with tip over central SVC. No focal consolidation, pleural effusion, pneumothorax. The cardiac silhouette is within normal limits. No acute osseous pathology. Degenerative changes of the shoulders. IMPRESSION: No active cardiopulmonary disease. Electronically Signed   By: Vanetta Chou M.D.   On: 03/29/2024 13:01      06/17/2024 -  Chemotherapy   Patient is on Treatment Plan : OVARIAN Liposomal Doxorubicin (40) q28d       Interval history- [name] is a 69 y.o. female with above history of recurrent ovarian cancer who presents to symptom management clinic for complaints of generalized weakness and fatigue.  She is concerned that she needs a blood transfusion.  Symptoms are gradual in onset over the last several weeks.  She has met with Dr.  Babara and is currently considering taking additional treatments for her cancer.  She had an appointment with interventional radiology today for consideration of possible embolization of known duodenal mass but canceled her appointment due to her symptoms.  Says that she eats very little and food feels heavy in her stomach.  It  worsens her pain.  She is taking oxycodone  extended release.  Has not had any short acting pain medicine today due to preference.  She has not tried any nutritional supplements.  No nausea or vomiting.  Abdomen does not feel distended.  Denies chest pain or shortness of breath.  Denies constipation or diarrhea.  She endorses weight loss  Review of systems- Review of Systems  Constitutional:  Positive for chills, fever, malaise/fatigue and weight loss.  HENT:  Negative for hearing loss, nosebleeds, sore throat and tinnitus.   Eyes:  Negative for blurred vision and double vision.  Respiratory:  Negative for cough, hemoptysis, shortness of breath and wheezing.   Cardiovascular:  Negative for chest pain, palpitations and leg swelling.  Gastrointestinal:  Positive for abdominal pain. Negative for blood in stool, constipation, diarrhea, melena, nausea and vomiting.  Genitourinary:  Negative for dysuria, frequency, hematuria and urgency.  Musculoskeletal:  Negative for back pain, falls, joint pain and myalgias.  Skin:  Negative for itching and rash.  Neurological:  Positive for weakness. Negative for dizziness, tingling, sensory change, loss of consciousness and headaches.  Endo/Heme/Allergies:  Negative for environmental allergies. Does not bruise/bleed easily.  Psychiatric/Behavioral:  Negative for depression. The patient is not nervous/anxious and does not have insomnia.     Allergies[1]  Past Medical History:  Diagnosis Date   Anxiety    Asthma    Back problem    disc disease, chronic low back pain, right leg pain, s/p discectomy 99   Cancer (HCC)    ovarian cancer   Depression    Diastolic dysfunction    with elevated LVEDP at cath   Endometriosis    Fibroids    hx of    GERD (gastroesophageal reflux disease)    Heart murmur    Hepatitis C    treated 8-9 years ago   Hidradenitis suppurativa    History of kidney stones    Hypertension    MS (multiple sclerosis)     Neuromuscular disorder (HCC)    Mulitple sclerosis   Osteoarthritis    Pre-diabetes    Prediabetes    RA (rheumatoid arthritis) (HCC)    Rotator cuff tear    right shoulder    Syncope     Past Surgical History:  Procedure Laterality Date   ANGIOPLASTY     BACK SURGERY     lumbar surgery x 2 done in [place]  and [place]   COLON RESECTION SIGMOID  08/16/2022   Procedure: COLON RESECTION SIGMOID;  Surgeon: Viktoria Comer SAUNDERS, MD;  Location: [facility];  Service: Gynecology;;   COLONOSCOPY  07/2010   tubular adenoma   COLONOSCOPY  08/2015   CYSTOSCOPY N/A 08/16/2022   Procedure: CYSTOSCOPY;  Surgeon: Viktoria Comer SAUNDERS, MD;  Location: [facility];  Service: Gynecology;  Laterality: N/A;   DISKECTOMY     ESOPHAGOGASTRODUODENOSCOPY N/A 03/30/2024   Procedure: EGD (ESOPHAGOGASTRODUODENOSCOPY);  Surgeon: Maryruth Ole DASEN, MD;  Location: [facility] ENDOSCOPY;  Service: Endoscopy;  Laterality: N/A;   HYSTERECTOMY ABDOMINAL WITH SALPINGO-OOPHORECTOMY  08/16/2022   Procedure: HYSTERECTOMY ABDOMINAL WITH SALPINGO-OOPHORECTOMY;  Surgeon: Viktoria Comer SAUNDERS, MD;  Location: [facility];  Service: Gynecology;;   IR IMAGING  GUIDED PORT INSERTION  05/18/2022   IR IMAGING GUIDED PORT INSERTION  07/03/2023   IR RADIOLOGIST EVAL & MGMT  05/27/2024   IR REMOVAL TUN ACCESS W/ PORT W/O FL MOD SED  12/20/2022   IR US  GUIDE BX ASP/DRAIN  05/18/2022   LAPAROSCOPY N/A 08/16/2022   Procedure: LAPAROSCOPY DIAGNOSTIC;  Surgeon: Viktoria Comer SAUNDERS, MD;  Location: [facility];  Service: Gynecology;  Laterality: N/A;   laparoscopy for endometriosis     left shoulder scope     RADIOLOGY WITH ANESTHESIA N/A 05/18/2022   Procedure: IR WITH ANESTHESIA PORT AND BIOPSY;  Surgeon: Luverne Aran, MD;  Location: [facility];  Service: Radiology;  Laterality: N/A;   ROTATOR CUFF REPAIR Left    Spinal Fusion  12/2016   L4-S1    Social History   Socioeconomic History   Marital status: Single    Spouse name: Not on  file   Number of children: 1   Years of education: Not on file   Highest education level: Not on file  Occupational History   Not on file  Tobacco Use   Smoking status: Some Days    Current packs/day: 0.00    Average packs/day: 0.1 packs/day for 27.0 years (2.7 ttl pk-yrs)    Types: Cigarettes    Start date: 79    Last attempt to quit: 2023    Years since quitting: 3.0   Smokeless tobacco: Never  Vaping Use   Vaping status: Never Used  Substance and Sexual Activity   Alcohol use: Not Currently   Drug use: Yes    Types: Oxycodone    Sexual activity: Not Currently  Other Topics Concern   Not on file  Social History Narrative   Lives alone   Right handed   Caffeine: none    Social Drivers of Health   Tobacco Use: High Risk (06/04/2024)   Patient History    Smoking Tobacco Use: Some Days    Smokeless Tobacco Use: Never    Passive Exposure: Not on file  Financial Resource Strain: Not on file  Food Insecurity: No Food Insecurity (04/24/2024)   Epic    Worried About Programme Researcher, Broadcasting/film/video in the Last Year: Never true    Ran Out of Food in the Last Year: Never true  Transportation Needs: No Transportation Needs (04/24/2024)   Epic    Lack of Transportation (Medical): No    Lack of Transportation (Non-Medical): No  Physical Activity: Not on file  Stress: Not on file  Social Connections: Socially Isolated (04/24/2024)   Social Connection and Isolation Panel    Frequency of Communication with Friends and Family: Three times a week    Frequency of Social Gatherings with Friends and Family: Never    Attends Religious Services: Never    Database Administrator or Organizations: No    Attends Banker Meetings: Never    Marital Status: Divorced  Catering Manager Violence: Not At Risk (04/24/2024)   Epic    Fear of Current or Ex-Partner: No    Emotionally Abused: No    Physically Abused: No    Sexually Abused: No  Depression (PHQ2-9):  Low Risk (05/28/2024)   Depression (PHQ2-9)    PHQ-2 Score: 0  Alcohol Screen: Not on file  Housing: Low Risk (05/20/2024)   Epic    Unable to Pay for Housing in the Last Year: No    Number of Times Moved in the Last Year: 0    Homeless in the  Last Year: No  Utilities: Not At Risk (04/24/2024)   Epic    Threatened with loss of utilities: No  Health Literacy: Not on file    Family History  Problem Relation Age of Onset   Heart disease Mother        Died with MI 4, chest pain 30s   Hypertension Mother    Pancreatitis Father    Atrial fibrillation Sister    Breast cancer Maternal Aunt        dx <50   Stomach cancer Maternal Grandfather        mets to liver? dx after 50   Multiple sclerosis Cousin    Breast cancer Cousin        mat female cousin; dx unknown age   Colon cancer Cousin 31       mat female cousin; mets   Leukemia Cousin 60       mat female cousin    Current Medications[2]  Physical exam:  There were no vitals filed for this visit.  Physical Exam Vitals reviewed.  Constitutional:      Appearance: She is not ill-appearing.     Comments: Ambulating with cane.  Abdominal:     General: There is no distension.     Tenderness: There is abdominal tenderness (upper abdomen tender to deep palpation). There is no guarding.  Skin:    Coloration: Skin is not pale.  Neurological:     Mental Status: She is alert and oriented to person, place, and time.  Psychiatric:        Mood and Affect: Mood normal.        Behavior: Behavior normal.         Latest Ref Rng & Units 06/04/2024    2:14 PM  CMP  Glucose 70 - 99 mg/dL 881   BUN 8 - 23 mg/dL 12   Creatinine 9.55 - 1.00 mg/dL 9.14   Sodium 864 - 854 mmol/L 134   Potassium 3.5 - 5.1 mmol/L 3.7   Chloride 98 - 111 mmol/L 98   CO2 22 - 32 mmol/L 26   Calcium 8.9 - 10.3 mg/dL 7.8   Total Protein 6.5 - 8.1 g/dL 5.9   Total Bilirubin 0.0 - 1.2 mg/dL 0.5   Alkaline Phos 38 - 126 U/L 93   AST 15 - 41  U/L 43   ALT 0 - 44 U/L 19       Latest Ref Rng & Units 06/04/2024   12:50 PM  CBC  WBC 4.0 - 10.5 K/uL 7.5   Hemoglobin 12.0 - 15.0 g/dL 5.9   Hematocrit 63.9 - 46.0 % 18.6   Platelets 150 - 400 K/uL 264     No images are attached to the encounter.  DG Chest Portable 1 View Result Date: 06/04/2024 EXAM: 1 VIEW(S) XRAY OF THE CHEST 06/04/2024 04:19:00 PM COMPARISON: 05/09/2024 CLINICAL HISTORY: Fever. FINDINGS: LINES, TUBES AND DEVICES: Right chest power injectable port in place with tip at the lower SVC. LUNGS AND PLEURA: Panlike density favoring atelectasis along the left hemidiaphragm. No pleural effusion. No pneumothorax. HEART AND MEDIASTINUM: No acute abnormality of the cardiac and mediastinal silhouettes. BONES AND SOFT TISSUES: No acute osseous abnormality. JOINTS: Mild degenerative glenohumeral arthropathy bilaterally. IMPRESSION: 1. Mild left basilar atelectasis along the left hemidiaphragm. 2. Right chest power-injectable port with tip in the lower SVC. 3. Mild bilateral glenohumeral degenerative arthropathy. Electronically signed by: Ryan Salvage MD 06/04/2024 04:52 PM EST RP Workstation: [id number]   IR  Radiologist Eval & Mgmt Result Date: 05/27/2024 EXAM: NEW PATIENT OFFICE VISIT CHIEF COMPLAINT: See below HISTORY OF PRESENT ILLNESS: See below REVIEW OF SYSTEMS: See below PHYSICAL EXAMINATION: See below ASSESSMENT AND PLAN: Please refer to completed note in the electronic medical record on [place] [facility] Thom Hall, MD [facility] Radiology Electronically Signed   By: Thom Hall M.D.   On: 05/27/2024 09:02   DG Chest 2 View Result Date: 05/09/2024 CLINICAL DATA:  Chest pain EXAM: CHEST - 2 VIEW COMPARISON:  March 29, 2024 FINDINGS: The heart size and mediastinal contours are within normal limits. Stable right internal jugular Port-A-Cath. Both lungs are clear. The visualized skeletal structures are unremarkable.  IMPRESSION: No active cardiopulmonary disease. Electronically Signed   By: Lynwood Landy Raddle M.D.   On: 05/09/2024 13:59    Assessment and plan- Patient is a 69 y.o. female diagnosed with recurrent high-grade serous ovarian cancer who presents to symptom management clinic for complaints of:   Weakness & Fatigue -suspect secondary to limited intake and known malignancy.  I recommend increasing calorie intake and sipping nutritional supplements.  I provided her with samples of Ensure complete today.  If she cannot tolerate the consistency she can dilute it with fair life milk.  I will schedule her to see Joli.  She is taking dexamethasone  daily and mirtazapine . 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  She canceled her appointment with IR for possible embolization today due to her above symptoms.  I encouraged her to follow-up with them.  Hemoglobin today is 8.5, therefore, we will hold off on transfusion today.  I recommend ongoing management of her anemia with Dr. Babara.  I will add a lab visit for her appointment later this week for possible blood transfusion on Friday.  Cancer related pain-localizes pain to site of known malignancy which is chronic and unchanged.  She limits her use of short acting pain medicine due to preference.  I suspect this may also be contributing to her fatigue and reviewed that improved pain control may improve her intake and fatigue.  Abdominal pain:   Disposition:  Add Port labs on Thursday (1/15) Add appt with Joli Keep appt as is.... [place]   Visit Diagnosis No diagnosis found.   Patient expressed understanding and was in agreement with this plan. She also understands that She can call clinic at any time with any questions, concerns, or complaints.   Thank you for allowing me to participate in the care of this very pleasant patient.   Tinnie Dawn, DNP, AGNP-C, AOCNP [facility] [phone]  CC:           [1] Allergies Allergen Reactions   Cymbalta [Duloxetine Hcl] Shortness Of Breath, Swelling and Other (See Comments)    Tongue and leg swelling   Cyclobenzaprine Hcl Other (See Comments)    Immobility   Celexa  [Citalopram Hydrobromide] Swelling and Other (See Comments)    Tongue swelling   Fluticasone  Furoate-Vilanterol Swelling and Other (See Comments)    BREO ELLIPTA - Tongue swelling, but breathing not affected   Gabapentin Other (See Comments)    Feels awful when taking, drowsy    Opana  [Oxymorphone Hcl] Itching and Other (See Comments)    Loss of hair, also   Tramadol Other (See Comments)    Dizziness   [2]  Current Outpatient Medications:    albuterol  (VENTOLIN  HFA) 108 (90 Base) MCG/ACT  inhaler, Inhale 2 puffs into the lungs every 6 (six) hours as needed for shortness of breath., Disp: , Rfl:    amphetamine -dextroamphetamine  (ADDERALL ) 20 MG tablet, Take 10-20 mg by mouth See admin instructions. Take 20 mg by mouth in the morning and 10 mg between 1-2 PM daily, Disp: , Rfl:    Ascorbic Acid (VITAMIN C) 1000 MG tablet, Take 1,000 mg by mouth daily., Disp: , Rfl:    ASHWAGANDHA PO, Take 1 capsule by mouth daily., Disp: , Rfl:    baclofen  (LIORESAL ) 10 MG tablet, Take 10 mg by mouth 3 (three) times daily as needed., Disp: , Rfl:    BLACK CURRANT SEED OIL PO, Take 1 capsule by mouth daily., Disp: , Rfl:    dexamethasone  (DECADRON ) 4 MG tablet, Take 1 tablet (4 mg total) by mouth daily., Disp: 30 tablet, Rfl: 6   hydroxychloroquine (PLAQUENIL) 200 MG tablet, Take 200 mg by mouth 2 (two) times daily., Disp: , Rfl:    lidocaine -prilocaine  (EMLA ) cream, Apply to affected area once, Disp: 30 g, Rfl: 3   LORazepam  (ATIVAN ) 0.5 MG tablet, Take 0.5 mg by mouth 3 (three) times daily as needed for anxiety., Disp: , Rfl:    mirtazapine  (REMERON ) 7.5 MG tablet, Take 7.5 mg by mouth at bedtime., Disp: , Rfl:    Multiple Vitamin  (MULTIVITAMIN WITH MINERALS) TABS tablet, Take 1 tablet by mouth daily., Disp: , Rfl:    naloxone  (NARCAN ) nasal spray 4 mg/0.1 mL, Place 1 spray into the nose as needed (opioid overdose)., Disp: , Rfl:    omeprazole  (PRILOSEC) 20 MG capsule, TAKE 1 CAPSULE BY MOUTH EVERY DAY, Disp: 90 capsule, Rfl: 1   ondansetron  (ZOFRAN ) 8 MG tablet, Take 1 tablet (8 mg total) by mouth every 8 (eight) hours as needed for nausea or vomiting., Disp: 30 tablet, Rfl: 1   OVER THE COUNTER MEDICATION, Take 2 capsules by mouth daily. [place] advanced supplement, Disp: , Rfl:    oxyCODONE  ER (XTAMPZA  ER) 13.5 MG C12A, Take 13.5 mg by mouth in the morning, at noon, and at bedtime., Disp: , Rfl:    Oxycodone  HCl 20 MG TABS, Take 20 mg by mouth 5 (five) times daily as needed (pain)., Disp: , Rfl:    polyethylene glycol (MIRALAX  / GLYCOLAX ) 17 g packet, Take 17 g by mouth daily., Disp: , Rfl:    prochlorperazine  (COMPAZINE ) 10 MG tablet, Take 1 tablet (10 mg total) by mouth every 6 (six) hours as needed for nausea or vomiting., Disp: 30 tablet, Rfl: 1   Vitamin D, Ergocalciferol, (DRISDOL) 1.25 MG (50000 UNIT) CAPS capsule, Take 50,000 Units by mouth every Sunday., Disp: , Rfl:    WIXELA INHUB 250-50 MCG/ACT AEPB, Inhale 1 puff into the lungs daily., Disp: , Rfl:  "

## 2024-06-04 NOTE — ED Triage Notes (Signed)
 Pt to ED from cancer center for low hgb 5.9. Pt has ovarian cancer, is not being treated. Reports chronic abd pain from mass.  Cancer center reported fever, afebrile on arrival.

## 2024-06-05 ENCOUNTER — Telehealth: Payer: Self-pay

## 2024-06-05 ENCOUNTER — Ambulatory Visit
Admission: RE | Admit: 2024-06-05 | Discharge: 2024-06-05 | Disposition: A | Discharge status: Home / Self Care | Source: Ambulatory Visit | Attending: Interventional Radiology | Admitting: Interventional Radiology

## 2024-06-05 ENCOUNTER — Other Ambulatory Visit: Payer: Self-pay | Admitting: Student

## 2024-06-05 ENCOUNTER — Encounter: Payer: Self-pay | Admitting: Oncology

## 2024-06-05 ENCOUNTER — Inpatient Hospital Stay

## 2024-06-05 ENCOUNTER — Other Ambulatory Visit: Payer: Self-pay

## 2024-06-05 DIAGNOSIS — C563 Malignant neoplasm of bilateral ovaries: Secondary | ICD-10-CM

## 2024-06-05 DIAGNOSIS — K3189 Other diseases of stomach and duodenum: Secondary | ICD-10-CM

## 2024-06-05 LAB — TYPE AND SCREEN
ABO/RH(D): O POS
Antibody Screen: NEGATIVE
Unit division: 0
Unit division: 0

## 2024-06-05 LAB — BPAM RBC
Blood Product Expiration Date: 202602202359
Blood Product Expiration Date: 202602202359
ISSUE DATE / TIME: 202601281623
ISSUE DATE / TIME: 202601281927
Unit Type and Rh: 5100
Unit Type and Rh: 5100

## 2024-06-05 MED ORDER — HEPARIN SOD (PORK) LOCK FLUSH 100 UNIT/ML IV SOLN
500.0000 [IU] | Freq: Once | INTRAVENOUS | Status: AC
  Administered 2024-06-05: 500 [IU] via INTRAVENOUS

## 2024-06-05 MED ORDER — SODIUM CHLORIDE 0.9% FLUSH
10.0000 mL | INTRAVENOUS | Status: DC | PRN
  Administered 2024-06-05: 10 mL via INTRAVENOUS

## 2024-06-05 MED ORDER — IOPAMIDOL (ISOVUE-370) INJECTION 76%
125.0000 mL | Freq: Once | INTRAVENOUS | Status: AC | PRN
  Administered 2024-06-05: 125 mL via INTRAVENOUS

## 2024-06-05 NOTE — Progress Notes (Signed)
 Pharmacist Chemotherapy Monitoring - Initial Assessment    Anticipated start date: 06/19/24   The following has been reviewed per standard work regarding the patient's treatment regimen: The patient's diagnosis, treatment plan and drug doses, and organ/hematologic function Lab orders and baseline tests specific to treatment regimen  The treatment plan start date, drug sequencing, and pre-medications Prior authorization status  Patient's documented medication list, including drug-drug interaction screen and prescriptions for anti-emetics and supportive care specific to the treatment regimen The drug concentrations, fluid compatibility, administration routes, and timing of the medications to be used The patient's access for treatment and lifetime cumulative dose history, if applicable  The patient's medication allergies and previous infusion related reactions, if applicable  Second line ovarian with doxil Alpa fr expression low  Changes made to treatment plan:  N/A  Follow up needed:  Pending authorization for treatment    [name], RPH, 06/05/2024  2:00 PM

## 2024-06-05 NOTE — Telephone Encounter (Signed)
 Pt has been discharged from ED. We will recheck labs next week with possible blood D2. Venofer  also r/s to 2/4. Mychart message sent to pt

## 2024-06-06 ENCOUNTER — Other Ambulatory Visit (HOSPITAL_COMMUNITY): Payer: Self-pay | Admitting: Oncology

## 2024-06-06 ENCOUNTER — Other Ambulatory Visit (HOSPITAL_COMMUNITY): Payer: Self-pay | Admitting: Radiology

## 2024-06-06 ENCOUNTER — Other Ambulatory Visit: Payer: Self-pay

## 2024-06-06 ENCOUNTER — Other Ambulatory Visit: Payer: Self-pay | Admitting: Oncology

## 2024-06-06 ENCOUNTER — Ambulatory Visit (HOSPITAL_COMMUNITY)
Admission: RE | Admit: 2024-06-06 | Discharge: 2024-06-06 | Disposition: A | Source: Ambulatory Visit | Attending: Oncology

## 2024-06-06 ENCOUNTER — Other Ambulatory Visit (HOSPITAL_COMMUNITY): Payer: Self-pay | Admitting: Interventional Radiology

## 2024-06-06 ENCOUNTER — Ambulatory Visit (HOSPITAL_COMMUNITY)
Admission: RE | Admit: 2024-06-06 | Discharge: 2024-06-06 | Disposition: A | Source: Ambulatory Visit | Attending: Interventional Radiology

## 2024-06-06 ENCOUNTER — Ambulatory Visit (HOSPITAL_COMMUNITY): Admission: RE | Admit: 2024-06-06 | Source: Ambulatory Visit

## 2024-06-06 DIAGNOSIS — C772 Secondary and unspecified malignant neoplasm of intra-abdominal lymph nodes: Secondary | ICD-10-CM | POA: Insufficient documentation

## 2024-06-06 DIAGNOSIS — I10 Essential (primary) hypertension: Secondary | ICD-10-CM | POA: Insufficient documentation

## 2024-06-06 DIAGNOSIS — C786 Secondary malignant neoplasm of retroperitoneum and peritoneum: Secondary | ICD-10-CM | POA: Insufficient documentation

## 2024-06-06 DIAGNOSIS — C569 Malignant neoplasm of unspecified ovary: Secondary | ICD-10-CM | POA: Diagnosis not present

## 2024-06-06 DIAGNOSIS — K3189 Other diseases of stomach and duodenum: Secondary | ICD-10-CM

## 2024-06-06 DIAGNOSIS — I2699 Other pulmonary embolism without acute cor pulmonale: Secondary | ICD-10-CM

## 2024-06-06 DIAGNOSIS — Z9049 Acquired absence of other specified parts of digestive tract: Secondary | ICD-10-CM | POA: Insufficient documentation

## 2024-06-06 DIAGNOSIS — Z87891 Personal history of nicotine dependence: Secondary | ICD-10-CM | POA: Insufficient documentation

## 2024-06-06 DIAGNOSIS — Z86711 Personal history of pulmonary embolism: Secondary | ICD-10-CM

## 2024-06-06 DIAGNOSIS — C787 Secondary malignant neoplasm of liver and intrahepatic bile duct: Secondary | ICD-10-CM | POA: Insufficient documentation

## 2024-06-06 DIAGNOSIS — Z90710 Acquired absence of both cervix and uterus: Secondary | ICD-10-CM | POA: Diagnosis not present

## 2024-06-06 DIAGNOSIS — I82402 Acute embolism and thrombosis of unspecified deep veins of left lower extremity: Secondary | ICD-10-CM | POA: Insufficient documentation

## 2024-06-06 DIAGNOSIS — Z90722 Acquired absence of ovaries, bilateral: Secondary | ICD-10-CM | POA: Diagnosis not present

## 2024-06-06 DIAGNOSIS — Z9221 Personal history of antineoplastic chemotherapy: Secondary | ICD-10-CM | POA: Diagnosis not present

## 2024-06-06 DIAGNOSIS — C784 Secondary malignant neoplasm of small intestine: Secondary | ICD-10-CM | POA: Insufficient documentation

## 2024-06-06 LAB — CBC
HCT: 26.5 % — ABNORMAL LOW (ref 36.0–46.0)
Hemoglobin: 8.6 g/dL — ABNORMAL LOW (ref 12.0–15.0)
MCH: 28.8 pg (ref 26.0–34.0)
MCHC: 32.5 g/dL (ref 30.0–36.0)
MCV: 88.6 fL (ref 80.0–100.0)
Platelets: 188 10*3/uL (ref 150–400)
RBC: 2.99 MIL/uL — ABNORMAL LOW (ref 3.87–5.11)
RDW: 17.5 % — ABNORMAL HIGH (ref 11.5–15.5)
WBC: 7.2 10*3/uL (ref 4.0–10.5)
nRBC: 0 % (ref 0.0–0.2)

## 2024-06-06 LAB — PROTIME-INR
INR: 1 (ref 0.8–1.2)
Prothrombin Time: 14.1 s (ref 11.4–15.2)

## 2024-06-06 LAB — BASIC METABOLIC PANEL WITH GFR
Anion gap: 11 (ref 5–15)
BUN: 18 mg/dL (ref 8–23)
CO2: 25 mmol/L (ref 22–32)
Calcium: 8.2 mg/dL — ABNORMAL LOW (ref 8.9–10.3)
Chloride: 101 mmol/L (ref 98–111)
Creatinine, Ser: 0.82 mg/dL (ref 0.44–1.00)
GFR, Estimated: >60 mL/min
Glucose, Bld: 130 mg/dL — ABNORMAL HIGH (ref 70–99)
Potassium: 3.5 mmol/L (ref 3.5–5.1)
Sodium: 136 mmol/L (ref 135–145)

## 2024-06-06 MED ORDER — MIDAZOLAM HCL 2 MG/2ML IJ SOLN
INTRAMUSCULAR | Status: AC
  Filled 2024-06-06: qty 2

## 2024-06-06 MED ORDER — CHLORHEXIDINE GLUCONATE CLOTH 2 % EX PADS: 6.0000 | MEDICATED_PAD | Freq: Every day | CUTANEOUS | Status: DC

## 2024-06-06 MED ORDER — IOHEXOL 300 MG/ML  SOLN
150.0000 mL | Freq: Once | INTRAMUSCULAR | Status: AC | PRN
  Administered 2024-06-06: 80 mL via INTRA_ARTERIAL

## 2024-06-06 MED ORDER — FENTANYL CITRATE (PF) 100 MCG/2ML IJ SOLN
INTRAMUSCULAR | Status: AC
  Filled 2024-06-06: qty 4

## 2024-06-06 MED ORDER — ALBUMIN HUMAN 25 % IV SOLN
INTRAVENOUS | Status: AC
  Filled 2024-06-06: qty 200

## 2024-06-06 MED ORDER — FENTANYL CITRATE (PF) 100 MCG/2ML IJ SOLN
INTRAMUSCULAR | Status: AC | PRN
  Administered 2024-06-06 (×12): 25 ug via INTRAVENOUS

## 2024-06-06 MED ORDER — SODIUM CHLORIDE 0.9% FLUSH: 3.0000 mL | INTRAVENOUS | Status: DC | PRN

## 2024-06-06 MED ORDER — FENTANYL CITRATE (PF) 100 MCG/2ML IJ SOLN
INTRAMUSCULAR | Status: AC
  Filled 2024-06-06: qty 2

## 2024-06-06 MED ORDER — GELATIN ABSORBABLE 12-7 MM EX MISC
CUTANEOUS | Status: AC
  Filled 2024-06-06: qty 2

## 2024-06-06 MED ORDER — HYDROMORPHONE HCL 1 MG/ML IJ SOLN
INTRAMUSCULAR | Status: AC | PRN
  Administered 2024-06-06: .5 mg via INTRAVENOUS

## 2024-06-06 MED ORDER — IOHEXOL 350 MG/ML SOLN
75.0000 mL | Freq: Once | INTRAVENOUS | Status: AC | PRN
  Administered 2024-06-06: 75 mL via INTRAVENOUS

## 2024-06-06 MED ORDER — MIDAZOLAM HCL (PF) 2 MG/2ML IJ SOLN
INTRAMUSCULAR | Status: AC | PRN
  Administered 2024-06-06 (×10): .5 mg via INTRAVENOUS
  Administered 2024-06-06: 1 mg via INTRAVENOUS

## 2024-06-06 MED ORDER — NITROGLYCERIN 1 MG/10 ML FOR IR/CATH LAB
INTRA_ARTERIAL | Status: AC
  Filled 2024-06-06: qty 10

## 2024-06-06 MED ORDER — SODIUM CHLORIDE 0.9% FLUSH: 10.0000 mL | INTRAVENOUS | Status: DC | PRN

## 2024-06-06 MED ORDER — SODIUM CHLORIDE 0.9 % IV SOLN
INTRAVENOUS | Status: AC | PRN
  Administered 2024-06-06: 2 g via INTRAVENOUS

## 2024-06-06 MED ORDER — ONDANSETRON HCL 4 MG/2ML IJ SOLN: 4.0000 mg | Freq: Four times a day (QID) | INTRAMUSCULAR | Status: DC | PRN

## 2024-06-06 MED ORDER — MORPHINE SULFATE (PF) 2 MG/ML IV SOLN: 2.0000 mg | INTRAVENOUS | Status: DC | PRN

## 2024-06-06 MED ORDER — MIDAZOLAM HCL 2 MG/2ML IJ SOLN
INTRAMUSCULAR | Status: AC
  Filled 2024-06-06: qty 4

## 2024-06-06 MED ORDER — SODIUM CHLORIDE 0.9 % IV SOLN
INTRAVENOUS | Status: AC
  Filled 2024-06-06: qty 2

## 2024-06-06 MED ORDER — HEPARIN SODIUM (PORCINE) 1000 UNIT/ML IJ SOLN
INTRAMUSCULAR | Status: AC
  Filled 2024-06-06: qty 10

## 2024-06-06 MED ORDER — ONDANSETRON HCL 4 MG/2ML IJ SOLN
INTRAMUSCULAR | Status: AC
  Filled 2024-06-06: qty 2

## 2024-06-06 MED ORDER — SODIUM CHLORIDE 0.9 % IV SOLN: INTRAVENOUS | Status: DC

## 2024-06-06 MED ORDER — ALBUMIN HUMAN 25 % IV SOLN
50.0000 g | Freq: Once | INTRAVENOUS | Status: AC
  Administered 2024-06-06: 50 g via INTRAVENOUS

## 2024-06-06 MED ORDER — IOHEXOL 300 MG/ML  SOLN
100.0000 mL | Freq: Once | INTRAMUSCULAR | Status: AC | PRN
  Administered 2024-06-06: 60 mL via INTRAVENOUS

## 2024-06-06 MED ORDER — FREE WATER: 500.0000 mL | Freq: Once | Status: DC

## 2024-06-06 MED ORDER — HYDROMORPHONE HCL 1 MG/ML IJ SOLN
INTRAMUSCULAR | Status: AC
  Filled 2024-06-06: qty 1

## 2024-06-06 MED ORDER — HYDROMORPHONE HCL 1 MG/ML IJ SOLN
1.0000 mg | INTRAMUSCULAR | Status: DC | PRN
  Administered 2024-06-06: 0.5 mg via INTRAVENOUS
  Filled 2024-06-06: qty 1

## 2024-06-06 MED ORDER — LIDOCAINE-EPINEPHRINE 1 %-1:100000 IJ SOLN
20.0000 mL | Freq: Once | INTRAMUSCULAR | Status: AC
  Administered 2024-06-06: 20 mL

## 2024-06-06 MED ORDER — HEPARIN SOD (PORK) LOCK FLUSH 100 UNIT/ML IV SOLN
500.0000 [IU] | INTRAVENOUS | Status: AC | PRN
  Administered 2024-06-06: 500 [IU]

## 2024-06-06 MED ORDER — SODIUM CHLORIDE 0.9% FLUSH: 10.0000 mL | Freq: Two times a day (BID) | INTRAVENOUS | Status: DC

## 2024-06-06 MED ORDER — IOHEXOL 300 MG/ML  SOLN
50.0000 mL | Freq: Once | INTRAMUSCULAR | Status: AC | PRN
  Administered 2024-06-06: 15 mL via INTRAVENOUS

## 2024-06-06 MED ORDER — LIDOCAINE-EPINEPHRINE 1 %-1:100000 IJ SOLN
INTRAMUSCULAR | Status: AC
  Filled 2024-06-06: qty 1

## 2024-06-06 MED ORDER — OXYCODONE HCL 5 MG PO TABS
20.0000 mg | ORAL_TABLET | Freq: Once | ORAL | Status: AC
  Administered 2024-06-06: 20 mg via ORAL
  Filled 2024-06-06: qty 4

## 2024-06-06 MED ORDER — VERAPAMIL HCL 2.5 MG/ML IV SOLN
INTRAVENOUS | Status: AC
  Filled 2024-06-06: qty 2

## 2024-06-06 MED ORDER — ONDANSETRON HCL 4 MG/2ML IJ SOLN
INTRAMUSCULAR | Status: AC | PRN
  Administered 2024-06-06: 4 mg via INTRAVENOUS

## 2024-06-06 NOTE — Progress Notes (Signed)
 Per Brittany NP only give 0.5 of dilaudid 

## 2024-06-06 NOTE — Progress Notes (Signed)
 VASCULAR LAB    Bilateral lower extremity venous duplex has been performed.  See CV proc for preliminary results.   Rhen Dossantos, RVT 06/06/2024, 3:18 PM

## 2024-06-06 NOTE — Progress Notes (Signed)
 [place]

## 2024-06-06 NOTE — Procedures (Signed)
 Vascular and Interventional Radiology Procedure Note  Patient: [name] DOB: 02-05-1956 Medical Record Number: [id number] Note Date/Time: 06/06/24 10:34 AM   Performing Physician: Thom Hall, MD Assistant(s): None  Diagnoses: Hx met ovarian CA. Duodenal metastasis w recurrent bleeding. Incidental PEs + LLE DVT. Unable to anticoagulate  Procedure(s):  MESENTERIC ARTERIOGRAPHY and DUODENAL MASS EMBOLIZATION  CENTRAL VENOGRAM and INFERIOR VENA CAVA FILTER PLACEMENT  Anesthesia: Conscious Sedation Complications: None Estimated Blood Loss: Minimal Specimens: None  Findings:  - access via the RIGHT femoral artery. 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 - AngioSeal closure at R groin, with palpable RLE pulses at the end of the case.  - IVC filter access via the RIGHT femoral vein. - successful placement of infrarenal IVC filter. - No obvious abnormality was identified at this time.  Plan: - Post sheath removal precautions.  - Standard post radial access deflation protocol. Bedrest with RLE straight x1 hrs.  *IVC filters can cause complications when left in place for extended periods of time. If medically appropriate, recommend discontinuing filter prior to discharge.  *Please re-evaluate the patient for filter discontinuation when they are seen in follow up, and refer patient to Interventional Radiology for removal.  Final report to follow once all images are reviewed and compared with previous studies.  See detailed dictation with images in PACS. The patient tolerated the procedure well without incident or complication and was returned to Recovery in stable condition.    Thom Hall, MD [facility] Radiology   Pager. [phone] Clinic. [phone]

## 2024-06-07 LAB — URINE CULTURE: Culture: >=100000 — AB

## 2024-06-10 ENCOUNTER — Other Ambulatory Visit: Payer: Self-pay

## 2024-06-10 ENCOUNTER — Observation Stay
Admission: EM | Admit: 2024-06-10 | Source: Home / Self Care | Attending: Internal Medicine | Admitting: Internal Medicine

## 2024-06-10 ENCOUNTER — Emergency Department

## 2024-06-10 ENCOUNTER — Telehealth: Payer: Self-pay | Admitting: *Deleted

## 2024-06-10 ENCOUNTER — Inpatient Hospital Stay

## 2024-06-10 DIAGNOSIS — Z515 Encounter for palliative care: Secondary | ICD-10-CM

## 2024-06-10 DIAGNOSIS — Z7189 Other specified counseling: Secondary | ICD-10-CM

## 2024-06-10 DIAGNOSIS — C563 Malignant neoplasm of bilateral ovaries: Secondary | ICD-10-CM

## 2024-06-10 DIAGNOSIS — D649 Anemia, unspecified: Principal | ICD-10-CM | POA: Diagnosis present

## 2024-06-10 DIAGNOSIS — R109 Unspecified abdominal pain: Secondary | ICD-10-CM

## 2024-06-10 DIAGNOSIS — K922 Gastrointestinal hemorrhage, unspecified: Secondary | ICD-10-CM | POA: Diagnosis present

## 2024-06-10 DIAGNOSIS — K209 Esophagitis, unspecified without bleeding: Secondary | ICD-10-CM

## 2024-06-10 DIAGNOSIS — K921 Melena: Secondary | ICD-10-CM

## 2024-06-10 DIAGNOSIS — C569 Malignant neoplasm of unspecified ovary: Secondary | ICD-10-CM

## 2024-06-10 DIAGNOSIS — G893 Neoplasm related pain (acute) (chronic): Secondary | ICD-10-CM | POA: Diagnosis present

## 2024-06-10 LAB — LACTIC ACID, PLASMA: Lactic Acid, Venous: 1 mmol/L (ref 0.5–1.9)

## 2024-06-10 LAB — URINALYSIS, ROUTINE W REFLEX MICROSCOPIC
Bilirubin Urine: NEGATIVE
Glucose, UA: NEGATIVE mg/dL
Ketones, ur: 5 mg/dL — AB
Leukocytes,Ua: NEGATIVE
Nitrite: NEGATIVE
Protein, ur: 30 mg/dL — AB
Specific Gravity, Urine: >1.046 — ABNORMAL HIGH (ref 1.005–1.030)
pH: 6 (ref 5.0–8.0)

## 2024-06-10 LAB — COMPREHENSIVE METABOLIC PANEL WITH GFR
ALT: 13 U/L (ref 0–44)
AST: 31 U/L (ref 15–41)
Albumin: 2.8 g/dL — ABNORMAL LOW (ref 3.5–5.0)
Alkaline Phosphatase: 98 U/L (ref 38–126)
Anion gap: 13 (ref 5–15)
BUN: 12 mg/dL (ref 8–23)
CO2: 23 mmol/L (ref 22–32)
Calcium: 7.9 mg/dL — ABNORMAL LOW (ref 8.9–10.3)
Chloride: 99 mmol/L (ref 98–111)
Creatinine, Ser: 0.58 mg/dL (ref 0.44–1.00)
GFR, Estimated: >60 mL/min
Glucose, Bld: 126 mg/dL — ABNORMAL HIGH (ref 70–99)
Potassium: 3 mmol/L — ABNORMAL LOW (ref 3.5–5.1)
Sodium: 135 mmol/L (ref 135–145)
Total Bilirubin: 0.6 mg/dL (ref 0.0–1.2)
Total Protein: 5.7 g/dL — ABNORMAL LOW (ref 6.5–8.1)

## 2024-06-10 LAB — CBC
HCT: 20.2 % — ABNORMAL LOW (ref 36.0–46.0)
Hemoglobin: 6.5 g/dL — ABNORMAL LOW (ref 12.0–15.0)
MCH: 28.4 pg (ref 26.0–34.0)
MCHC: 32.2 g/dL (ref 30.0–36.0)
MCV: 88.2 fL (ref 80.0–100.0)
Platelets: 255 10*3/uL (ref 150–400)
RBC: 2.29 MIL/uL — ABNORMAL LOW (ref 3.87–5.11)
RDW: 18.8 % — ABNORMAL HIGH (ref 11.5–15.5)
WBC: 8.6 10*3/uL (ref 4.0–10.5)
nRBC: 0.5 % — ABNORMAL HIGH (ref 0.0–0.2)

## 2024-06-10 LAB — PREPARE RBC (CROSSMATCH)

## 2024-06-10 LAB — LIPASE, BLOOD: Lipase: 33 U/L (ref 11–51)

## 2024-06-10 LAB — MAGNESIUM: Magnesium: 1.7 mg/dL (ref 1.7–2.4)

## 2024-06-10 LAB — TROPONIN T, HIGH SENSITIVITY
Troponin T High Sensitivity: 25 ng/L — ABNORMAL HIGH (ref 0–19)
Troponin T High Sensitivity: 23 ng/L — ABNORMAL HIGH (ref 0–19)

## 2024-06-10 LAB — PHOSPHORUS: Phosphorus: 3.6 mg/dL (ref 2.5–4.6)

## 2024-06-10 MED ORDER — POTASSIUM CHLORIDE CRYS ER 20 MEQ PO TBCR
40.0000 meq | EXTENDED_RELEASE_TABLET | Freq: Once | ORAL | Status: AC
  Administered 2024-06-10: 40 meq via ORAL
  Filled 2024-06-10: qty 2

## 2024-06-10 MED ORDER — SODIUM CHLORIDE 0.9% IV SOLUTION
Freq: Once | INTRAVENOUS | Status: AC
  Filled 2024-06-10: qty 250

## 2024-06-10 MED ORDER — OXYCODONE HCL 5 MG PO TABS
20.0000 mg | ORAL_TABLET | Freq: Every day | ORAL | Status: AC | PRN
  Administered 2024-06-11 – 2024-06-13 (×8): 20 mg via ORAL
  Filled 2024-06-10 (×8): qty 4

## 2024-06-10 MED ORDER — IOHEXOL 350 MG/ML SOLN
100.0000 mL | Freq: Once | INTRAVENOUS | Status: AC | PRN
  Administered 2024-06-10: 100 mL via INTRAVENOUS

## 2024-06-10 MED ORDER — HYDROMORPHONE HCL 1 MG/ML IJ SOLN
1.0000 mg | INTRAMUSCULAR | Status: AC | PRN
  Administered 2024-06-10 – 2024-06-13 (×15): 1 mg via INTRAVENOUS
  Filled 2024-06-10 (×16): qty 1

## 2024-06-10 MED ORDER — POLYETHYLENE GLYCOL 3350 17 G PO PACK: 17.0000 g | PACK | Freq: Every day | ORAL | Status: AC | PRN

## 2024-06-10 MED ORDER — ACETAMINOPHEN 325 MG PO TABS
650.0000 mg | ORAL_TABLET | Freq: Four times a day (QID) | ORAL | Status: AC | PRN
  Filled 2024-06-10: qty 2

## 2024-06-10 MED ORDER — PANTOPRAZOLE SODIUM 40 MG PO TBEC
40.0000 mg | DELAYED_RELEASE_TABLET | Freq: Every day | ORAL | Status: AC
  Administered 2024-06-11 – 2024-06-13 (×3): 40 mg via ORAL
  Filled 2024-06-10 (×3): qty 1

## 2024-06-10 MED ORDER — VITAMIN D (ERGOCALCIFEROL) 1.25 MG (50000 UNIT) PO CAPS: 50000.0000 [IU] | ORAL_CAPSULE | ORAL | Status: AC

## 2024-06-10 MED ORDER — ONDANSETRON HCL 4 MG/2ML IJ SOLN
4.0000 mg | Freq: Once | INTRAMUSCULAR | Status: AC
  Administered 2024-06-10: 4 mg via INTRAVENOUS
  Filled 2024-06-10: qty 2

## 2024-06-10 MED ORDER — DEXAMETHASONE 4 MG PO TABS
4.0000 mg | ORAL_TABLET | Freq: Every day | ORAL | Status: AC
  Administered 2024-06-12 – 2024-06-13 (×2): 4 mg via ORAL
  Filled 2024-06-10 (×3): qty 1

## 2024-06-10 MED ORDER — ALBUTEROL SULFATE (2.5 MG/3ML) 0.083% IN NEBU: 2.5000 mg | INHALATION_SOLUTION | Freq: Four times a day (QID) | RESPIRATORY_TRACT | Status: AC | PRN

## 2024-06-10 MED ORDER — ALBUTEROL SULFATE HFA 108 (90 BASE) MCG/ACT IN AERS: 2.0000 | INHALATION_SPRAY | Freq: Four times a day (QID) | RESPIRATORY_TRACT | Status: DC | PRN

## 2024-06-10 MED ORDER — OXYCODONE HCL ER 15 MG PO T12A
15.0000 mg | EXTENDED_RELEASE_TABLET | Freq: Two times a day (BID) | ORAL | Status: AC
  Administered 2024-06-10 – 2024-06-13 (×6): 15 mg via ORAL
  Filled 2024-06-10 (×5): qty 1

## 2024-06-10 MED ORDER — HYDROXYCHLOROQUINE SULFATE 200 MG PO TABS
200.0000 mg | ORAL_TABLET | Freq: Two times a day (BID) | ORAL | Status: AC
  Administered 2024-06-10 – 2024-06-13 (×7): 200 mg via ORAL
  Filled 2024-06-10 (×7): qty 1

## 2024-06-10 MED ORDER — ACETAMINOPHEN 650 MG RE SUPP: 650.0000 mg | Freq: Four times a day (QID) | RECTAL | Status: AC | PRN

## 2024-06-10 MED ORDER — HYDROMORPHONE HCL 1 MG/ML IJ SOLN
1.0000 mg | Freq: Once | INTRAMUSCULAR | Status: AC
  Administered 2024-06-10: 1 mg via INTRAVENOUS
  Filled 2024-06-10: qty 1

## 2024-06-10 MED ORDER — PANTOPRAZOLE SODIUM 40 MG IV SOLR
40.0000 mg | Freq: Once | INTRAVENOUS | Status: AC
  Administered 2024-06-10: 40 mg via INTRAVENOUS
  Filled 2024-06-10: qty 10

## 2024-06-10 MED ORDER — PROCHLORPERAZINE MALEATE 10 MG PO TABS: 10.0000 mg | ORAL_TABLET | Freq: Four times a day (QID) | ORAL | Status: AC | PRN

## 2024-06-10 MED ORDER — LORAZEPAM 0.5 MG PO TABS
0.5000 mg | ORAL_TABLET | Freq: Three times a day (TID) | ORAL | Status: AC | PRN
  Administered 2024-06-10 – 2024-06-13 (×4): 0.5 mg via ORAL
  Filled 2024-06-10 (×4): qty 1

## 2024-06-10 MED ORDER — MIRTAZAPINE 15 MG PO TABS
7.5000 mg | ORAL_TABLET | Freq: Every day | ORAL | Status: AC
  Administered 2024-06-10 – 2024-06-13 (×4): 7.5 mg via ORAL
  Filled 2024-06-10 (×4): qty 1

## 2024-06-10 MED ORDER — NALOXONE HCL 4 MG/0.1ML NA LIQD: 1.0000 | NASAL | Status: AC | PRN

## 2024-06-10 NOTE — ED Provider Notes (Signed)
 "  [facility] Provider Note    Event Date/Time   First MD Initiated Contact with Patient 06/10/24 1202     (approximate)   History   Abdominal Pain   HPI  [name] is a 69 y.o. female with ovarian cancer with procedure done on Friday, abdominal pain from known ovarian cancer who comes in with abdominal pain.  I reviewed a note where patient was requesting a medication refill as she was out of her narcotics.  I reviewed the ER where patient was given 2 units of blood on 06/04/2024  Patient reports that she had an IR embolization done secondary to continued bleeding from her rectum.  She continues to have black stools but after this embolization she reports worsening pain in her abdomen.  She denies any new shortness of breath denies any headaches, falls hitting her head.  Physical Exam   Triage Vital Signs: ED Triage Vitals [06/10/24 1145]  Encounter Vitals Group     BP 109/75     Girls Systolic BP Percentile      Girls Diastolic BP Percentile      Boys Systolic BP Percentile      Boys Diastolic BP Percentile      Pulse Rate 92     Resp (!) 24     Temp 98.3 F (36.8 C)     Temp Source Oral     SpO2 100 %     Weight 145 lb (65.8 kg)     Height 5' 9 (1.753 m)     Head Circumference      Peak Flow      Pain Score 10     Pain Loc      Pain Education      Exclude from Growth Chart     Most recent vital signs: Vitals:   06/10/24 1145  BP: 109/75  Pulse: 92  Resp: (!) 24  Temp: 98.3 F (36.8 C)  SpO2: 100%     General: Awake, no distress.  CV:  Good peripheral perfusion.  Resp:  Normal effort.  Abd:  No distention.  Tender throughout her abdomen. Other:  No tenderness or swelling noted to the right femoral area   ED Results / Procedures / Treatments   Labs (all labs ordered are listed, but only abnormal results are displayed) Labs Reviewed  LIPASE, BLOOD  COMPREHENSIVE METABOLIC PANEL WITH GFR  CBC  URINALYSIS, ROUTINE W  REFLEX MICROSCOPIC  TROPONIN T, HIGH SENSITIVITY     EKG  My interpretation of EKG:  Sinus rate 97 without any ST elevation or T wave inversions, normal intervals  RADIOLOGY Pending   PROCEDURES:  Critical Care performed: Yes, see critical care procedure note(s)  .1-3 Lead EKG Interpretation  Performed by: Ernest [name] BRAVO, MD Authorized by: Ernest [name] BRAVO, MD     Interpretation: normal     ECG rate:  90   ECG rate assessment: normal     Rhythm: sinus rhythm     Ectopy: none     Conduction: normal   .Critical Care  Performed by: Ernest [name] BRAVO, MD Authorized by: Ernest [name] BRAVO, MD   Critical care provider statement:    Critical care time (minutes):  30   Critical care was necessary to treat or prevent imminent or life-threatening deterioration of the following conditions: symptomatic anemia.   Critical care was time spent personally by me on the following activities:  Development of treatment plan with patient or surrogate,  discussions with consultants, evaluation of patient's response to treatment, examination of patient, ordering and review of laboratory studies, ordering and review of radiographic studies, ordering and performing treatments and interventions, pulse oximetry, re-evaluation of patient's condition and review of old charts    MEDICATIONS ORDERED IN ED: Medications  0.9 %  sodium chloride  infusion (Manually program via Guardrails IV Fluids) (has no administration in time range)  ondansetron  (ZOFRAN ) injection 4 mg (4 mg Intravenous Given 06/10/24 1244)  HYDROmorphone  (DILAUDID ) injection 1 mg (1 mg Intravenous Given 06/10/24 1244)     IMPRESSION / MDM / ASSESSMENT AND PLAN / ED COURSE  I reviewed the triage vital signs and the nursing notes.   Patient's presentation is most consistent with acute presentation with potential threat to life or bodily function.   Patient comes in with worsening pain in her abdomen after embolization.  Will get CT angio to  evaluate for evidence of active bleeding.  Or other acute abdominal pathology.  I do not see any issues with site where embolization was achieved through on the right groin.  CBC shows hemoglobin down to 6.5 so we will give 1 unit of blood.  Discussed with patient that she will most likely require admission to the hospital to monitor hemoglobins given her continued dark stools and now patient having pain.  Her lactate is normal which is reassuring.  Patient given some Dilaudid , Zofran  to help with symptoms   The patient is on the cardiac monitor to evaluate for evidence of arrhythmia and/or significant heart rate changes.      FINAL CLINICAL IMPRESSION(S) / ED DIAGNOSES   Final diagnoses:  Symptomatic anemia  Gastrointestinal hemorrhage with melena     Rx / DC Orders   ED Discharge Orders     None        Note:  This document was prepared using Dragon voice recognition software and may include unintentional dictation errors.   Ernest [name] BRAVO, MD 06/10/24 [phone]  "

## 2024-06-10 NOTE — ED Notes (Signed)
 Pt is able to ambulate to toilet to provide UA sample. Patient states she is in too much pain at this moment to make it to the [facility]

## 2024-06-10 NOTE — ED Triage Notes (Addendum)
 Pt to ED via [facility] from home. Pt dx with Stage 4 ovarian cancer with mets. Pt reports worsening sharp abd pain and bilateral ankle swelling for a few days. Afebrile in route. Pt requesting port access for blood draw   Ems VS:  118/92 HR 90 98% RA  CBG 130

## 2024-06-11 ENCOUNTER — Inpatient Hospital Stay

## 2024-06-11 ENCOUNTER — Inpatient Hospital Stay: Attending: Gynecologic Oncology

## 2024-06-11 ENCOUNTER — Inpatient Hospital Stay: Admitting: Hospice and Palliative Medicine

## 2024-06-11 ENCOUNTER — Other Ambulatory Visit: Payer: Self-pay | Admitting: Oncology

## 2024-06-11 DIAGNOSIS — K209 Esophagitis, unspecified without bleeding: Secondary | ICD-10-CM

## 2024-06-11 DIAGNOSIS — C569 Malignant neoplasm of unspecified ovary: Secondary | ICD-10-CM | POA: Diagnosis not present

## 2024-06-11 DIAGNOSIS — K921 Melena: Secondary | ICD-10-CM | POA: Diagnosis not present

## 2024-06-11 DIAGNOSIS — D649 Anemia, unspecified: Secondary | ICD-10-CM | POA: Diagnosis not present

## 2024-06-11 DIAGNOSIS — G893 Neoplasm related pain (acute) (chronic): Secondary | ICD-10-CM | POA: Diagnosis not present

## 2024-06-11 DIAGNOSIS — Z515 Encounter for palliative care: Secondary | ICD-10-CM | POA: Diagnosis not present

## 2024-06-11 DIAGNOSIS — Z7189 Other specified counseling: Secondary | ICD-10-CM

## 2024-06-11 LAB — BASIC METABOLIC PANEL WITH GFR
Anion gap: 10 (ref 5–15)
BUN: 10 mg/dL (ref 8–23)
CO2: 22 mmol/L (ref 22–32)
Calcium: 7.6 mg/dL — ABNORMAL LOW (ref 8.9–10.3)
Chloride: 99 mmol/L (ref 98–111)
Creatinine, Ser: 0.67 mg/dL (ref 0.44–1.00)
GFR, Estimated: >60 mL/min
Glucose, Bld: 108 mg/dL — ABNORMAL HIGH (ref 70–99)
Potassium: 4.7 mmol/L (ref 3.5–5.1)
Sodium: 131 mmol/L — ABNORMAL LOW (ref 135–145)

## 2024-06-11 LAB — TYPE AND SCREEN
ABO/RH(D): O POS
Antibody Screen: NEGATIVE
Unit division: 0

## 2024-06-11 LAB — CBC
HCT: 26 % — ABNORMAL LOW (ref 36.0–46.0)
Hemoglobin: 8.2 g/dL — ABNORMAL LOW (ref 12.0–15.0)
MCH: 28.2 pg (ref 26.0–34.0)
MCHC: 31.5 g/dL (ref 30.0–36.0)
MCV: 89.3 fL (ref 80.0–100.0)
Platelets: 259 10*3/uL (ref 150–400)
RBC: 2.91 MIL/uL — ABNORMAL LOW (ref 3.87–5.11)
RDW: 18 % — ABNORMAL HIGH (ref 11.5–15.5)
WBC: 7.6 10*3/uL (ref 4.0–10.5)
nRBC: 0 % (ref 0.0–0.2)

## 2024-06-11 LAB — BPAM RBC
Blood Product Expiration Date: 202603032359
ISSUE DATE / TIME: 202602031500
Unit Type and Rh: 5100

## 2024-06-11 NOTE — ED Notes (Signed)
 NURSE HECTOR RN INFORMED TO ASSIGNED BED

## 2024-06-11 NOTE — Consult Note (Signed)
 "    Palliative Medicine [facility] at [facility] [place] [phone] [phone]   Name: [name] Date: 06/11/2024 MRN: [id number]  DOB: [birth year]  Patient Care Team: Arloa Elsie SAUNDERS, MD as PCP - General (Family Medicine) [name] (Physician Assistant) Maurie Rayfield BIRCH, RN as Oncology Nurse Navigator Babara Call, MD as Consulting Physician (Oncology)    REASON FOR CONSULTATION: [name] is a 69 y.o. female with multiple medical problems including RA, MS, and metastatic ovarian cancer unresponsive to previous treatment. Patient was hospitalized in November and December 2025 with GI bleed. She had EGD with findings of a bleeding mass, invading the small bowel.  Patient is status post embolization and IVC filter placement.  Patient was admitted to hospital on 06/10/2024 with worsening abdominal pain.  Patient has required frequent transfusions. She was referred to palliative care to discuss goals. .   SOCIAL HISTORY:     reports that she has been smoking cigarettes. She started smoking about 30 years ago. She has a 2.7 pack-year smoking history. She has never used smokeless tobacco. She reports that she does not currently use alcohol. She reports current drug use. Drug: Oxycodone .  Lives at home alone.  Has a daughter in [place].  Has a sister who is involved.  She recently retired from [employer] where she worked in [employer].  ADVANCE DIRECTIVES:  On file  CODE STATUS: DNR  PAST MEDICAL HISTORY: Past Medical History:  Diagnosis Date   Anxiety    Asthma    Back problem    disc disease, chronic low back pain, right leg pain, s/p discectomy 99   Cancer (HCC)    ovarian cancer   Depression    Diastolic dysfunction    with elevated LVEDP at cath   Endometriosis    Fibroids    hx of    GERD (gastroesophageal reflux disease)    Heart murmur    Hepatitis C    treated 8-9 years ago    Hidradenitis suppurativa    History of kidney stones    Hypertension    MS (multiple sclerosis)    Neuromuscular disorder (HCC)    Mulitple sclerosis   Osteoarthritis    Pre-diabetes    Prediabetes    RA (rheumatoid arthritis) (HCC)    Rotator cuff tear    right shoulder    Syncope     PAST SURGICAL HISTORY:  Past Surgical History:  Procedure Laterality Date   ANGIOPLASTY     BACK SURGERY     lumbar surgery x 2 done in [place]  and [place]   COLON RESECTION SIGMOID  08/16/2022   Procedure: COLON RESECTION SIGMOID;  Surgeon: Viktoria Comer SAUNDERS, MD;  Location: [facility];  Service: Gynecology;;   COLONOSCOPY  07/2010   tubular adenoma   COLONOSCOPY  08/2015   CYSTOSCOPY N/A 08/16/2022   Procedure: CYSTOSCOPY;  Surgeon: Viktoria Comer SAUNDERS, MD;  Location: [facility];  Service: Gynecology;  Laterality: N/A;   DISKECTOMY     ESOPHAGOGASTRODUODENOSCOPY N/A 03/30/2024   Procedure: EGD (ESOPHAGOGASTRODUODENOSCOPY);  Surgeon: Maryruth Ole DASEN, MD;  Location: [facility] ENDOSCOPY;  Service: Endoscopy;  Laterality: N/A;   HYSTERECTOMY ABDOMINAL WITH SALPINGO-OOPHORECTOMY  08/16/2022   Procedure: HYSTERECTOMY ABDOMINAL WITH SALPINGO-OOPHORECTOMY;  Surgeon: Viktoria Comer SAUNDERS, MD;  Location: [facility];  Service: Gynecology;;   IR ANGIOGRAM SELECTIVE EACH ADDITIONAL VESSEL  06/06/2024   IR ANGIOGRAM SELECTIVE EACH ADDITIONAL VESSEL  06/06/2024   IR  ANGIOGRAM VISCERAL SELECTIVE  06/06/2024   IR EMBO TUMOR ORGAN ISCHEMIA INFARCT INC GUIDE ROADMAPPING  06/06/2024   IR IMAGING GUIDED PORT INSERTION  05/18/2022   IR IMAGING GUIDED PORT INSERTION  07/03/2023   IR IVC FILTER PLMT / S&I /IMG GUID/MOD SED  06/06/2024   IR RADIOLOGIST EVAL & MGMT  05/27/2024   IR REMOVAL TUN ACCESS W/ PORT W/O FL MOD SED  12/20/2022   IR US  GUIDE BX ASP/DRAIN  05/18/2022   IR US  GUIDE VASC ACCESS RIGHT  06/06/2024   LAPAROSCOPY N/A 08/16/2022   Procedure: LAPAROSCOPY DIAGNOSTIC;  Surgeon: Viktoria Comer SAUNDERS, MD;  Location: [facility];   Service: Gynecology;  Laterality: N/A;   laparoscopy for endometriosis     left shoulder scope     RADIOLOGY WITH ANESTHESIA N/A 05/18/2022   Procedure: IR WITH ANESTHESIA PORT AND BIOPSY;  Surgeon: Luverne Aran, MD;  Location: [facility];  Service: Radiology;  Laterality: N/A;   ROTATOR CUFF REPAIR Left    Spinal Fusion  12/2016   L4-S1    HEMATOLOGY/ONCOLOGY HISTORY:  Oncology History Overview Note  High grade serous, p53 mutated Neg genetics, HRD not detected, MSI stable, low TMB 2, progressed on carboplatin  and gemcitabine , paclitaxel    Malignant neoplasm of both ovaries (HCC)  04/11/2022 Imaging   CT of the abdomen and pelvis on 04/11/2022 reveals a left adnexal masslike area measuring 5.9 x 3.6 cm.  Margins of this mass are irregular.  There is small fluid in the cul-de-sac as well as fullness of soft tissue about the right adnexa.  Discrete uterine structure is not identified.  Left upper quadrant nodules beneath the left hemidiaphragm (1 anterior to the spleen and the other to the stomach).  Omental/peritoneal nodularity and multiple perihepatic nodules are noted    04/18/2022 Tumor Marker   Patient's tumor was tested for the following markers: CA-125. Results of the tumor marker test revealed 123.   04/19/2022 Initial Diagnosis   Carcinomatosis (HCC)   04/28/2022 Imaging   1. Bulky bilateral ovarian masses and nodules, left-greater-than-right, which are essentially confluent with adjacent pelvic soft tissue nodularity and not significantly changed compared to prior examination dated 04/11/2022. 2. Extensive pelvic peritoneal thickening and nodularity. Multiple small peritoneal nodules throughout the abdomen and pelvis. Findings are consistent with peritoneal metastatic disease and likewise not significantly changed. 3. Small volume of loculated appearing fluid in the low pelvis. 4. 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 The mid to distal right ureter is obstructed by right ovarian mass and or soft tissue nodularity. 5. Calculus within the most distal left ureterovesicular junction or just within the bladder lumen measuring 0.7 cm, unchanged. No associated left-sided hydronephrosis. 6. Soft tissue attenuation nodule of the body of the right adrenal gland. Notably, this was also soft tissue attenuation on prior noncontrast examination (i.e. not definitively macroscopic fat containing) although unchanged compared to prior examinations dating back to 2018 and almost certainly a benign adenoma. Attention on follow-up. 7. No evidence of metastatic disease in the chest. 8. Mild diffuse bilateral bronchial wall thickening. Background of very fine centrilobular nodularity, most concentrated in the lung apices. Findings are most consistent with smoking-related respiratory bronchiolitis.     05/18/2022 Procedure   Ultrasound-guided core biopsy performed of a 10 mm soft tissue peritoneal mass in the right upper quadrant just superficial to the right lobe of the liver. The procedure was performed under general anesthesia immediately  following Port-A-Cath placement.   05/18/2022 Procedure   Placement of single lumen port a cath via right internal jugular vein. The catheter tip lies at the cavo-atrial junction. A power injectable port a cath was placed and is ready for immediate use.     05/18/2022 Pathology Results   A. 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  The findings support the above interpretation.  This case was reviewed with Dr. Rebbecca who agrees with the above diagnosis.  A p16 stain is pending and will be reported in an addendum.    05/23/2022 Cancer Staging   Staging form: Ovary, Fallopian Tube, and  Primary Peritoneal Carcinoma, AJCC 8th Edition - Clinical stage from 05/23/2022: FIGO Stage IIIC (cT3c, cN0, cM0) - Signed by Lonn Hicks, MD on 05/23/2022 Stage prefix: Initial diagnosis   05/29/2022 Imaging   1. Complex bilateral adnexal masses, as described above, consistent with the patient's known malignancy. 2. Subsequent marked severity mass effect on the distal bilateral common iliac veins, increased in severity when compared to the prior exam. 3. Additional findings consistent with peritoneal metastasis within the pelvis. 4. Findings consistent with pelvic congestion syndrome. 5. Postoperative changes within the mid and lower lumbar spine. 6. Aortic atherosclerosis. 7. No evidence of venous thrombus within the abdomen, pelvis or proximal bilateral lower extremities.   Aortic Atherosclerosis (ICD10-I70.0).   06/02/2022 - 11/03/2022 Chemotherapy   Patient is on Treatment Plan : OVARIAN Carboplatin  (AUC 6) + Paclitaxel  (175) q21d X 6 Cycles     06/26/2022 Tumor Marker   Patient's tumor was tested for the following markers: CA-125. Results of the tumor marker test revealed 134.   08/04/2022 Imaging   CT ABDOMEN PELVIS W CONTRAST  Result Date: 08/04/2022 CLINICAL DATA:  Ovarian cancer, chemotherapy in progress, for restaging EXAM: CT ABDOMEN AND PELVIS WITH CONTRAST TECHNIQUE: Multidetector CT imaging of the abdomen and pelvis was performed using the standard protocol following bolus administration of intravenous contrast. RADIATION DOSE REDUCTION: This exam was performed according to the departmental dose-optimization program which includes automated exposure control, adjustment of the mA and/or kV according to patient size and/or use of iterative reconstruction technique. CONTRAST:  OMNIPAQUE  IOHEXOL  300 MG/ML  SOLN COMPARISON:  CT venogram abdomen/pelvis dated 05/29/2022. CT abdomen/pelvis dated 04/26/2022. FINDINGS: Lower chest: Lung bases are clear. Hepatobiliary: Liver is within  normal limits. Gallbladder is unremarkable. No intrahepatic or extrahepatic ductal dilatation. Pancreas: Within normal limits. Spleen: Within normal limits. Adrenals/Urinary Tract: 2.4 cm right adrenal nodule (series 2/image 17), previously 2.0 cm in 2018, likely reflecting a benign adrenal adenoma. Left adrenal gland is within normal limits. Left kidney is within normal limits. 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 Associated extrinsic compression at the level of the right adnexal mass (described below). Mildly thick-walled bladder, although underdistended. Stable 6 mm left UVJ calcification (series 2/image 68). Stomach/Bowel: Stomach is within normal limits. No evidence of bowel obstruction. Normal appendix (series 2/image 47). Left adnexal mass (described below) is favored to abut and directly invade the left lateral wall of the sigmoid colon (series 2/image 57). Vascular/Lymphatic: No evidence of abdominal aortic aneurysm. Atherosclerotic calcifications of the abdominal aorta and branch vessels. No suspicious abdominopelvic lymphadenopathy. Reproductive: Heterogeneous uterus with associated surface nodularity (series 2/image 65), likely reflecting peritoneal disease. This appearance is improved from  the prior. 3.9 x 3.3 cm left adnexal mass (series 2/image 60), previously 5.3 x 3.9 cm, with extension along the sigmoid mesocolon (series 2/image 56) and suspected involvement of the sigmoid colon (series 2/image 57). 5.6 x 2.7 cm mixed cystic/solid right adnexal mass (series 2/image 89), previously 6.9 x 4.3 cm when measured in a similar fashion. Other: No abdominopelvic ascites. Scattered minimal peritoneal nodularity beneath the anterior abdominal wall, including a 5 mm nodule beneath the left anterior abdominal wall (series 2/image 27) which previously measured 12 mm. Additional mild nodularity on series 2/images  32, 33, and 50. Musculoskeletal: Status post PLIF at L4-S1. Status post lateral fixation at L4-5. Status post anterior fixation at L5-S1. Mild degenerative changes of the lower thoracic spine. IMPRESSION: Improving bilateral adnexa masses in this patient with known ovarian cancer, as above. Suspected involvement of the sigmoid colon. No evidence of bowel obstruction. Improving peritoneal nodularity, as above. Additional ancillary findings as above. Electronically Signed   By: Pinkie Pebbles M.D.   On: 08/04/2022 01:27      08/16/2022 Pathology Results   CASE: [id number] PATIENT: BARBEE MACE Surgical Pathology Report  Clinical History: Advanced gyn malignancy (crm)  FINAL MICROSCOPIC DIAGNOSIS:  A. OMENTUM, RESECTION: - Positive for carcinoma  B. LIVER ADHESION, EXCISION: - Benign fibroadipose tissue - Negative for carcinoma  C. SMALL BOWEL MESENTERIC IMPLANT, EXCISION: - Positive for carcinoma with extensive necrosis  D. UTERUS, CERVIX, BILATERAL FALLOPIAN TUBES AND OVARIES, AND APPENDIX, RESECTION: - Uterine serosa: Tumor deposits present - Cervix: Benign, nabothian cyst - Endometrium: Benign inactive endometrium, benign endometrial polyp - Myometrium: Leiomyoma - Right fallopian tube: Metastatic tumor deposits present - Right ovary: High-grade serous carcinoma - Left ovary: High-grade serous carcinoma - Benign appendix - See oncology table  E. COLON, RECTOSIGMOID, RESECTION: - Benign colonic mucosa - Margins free of carcinoma - Adherent fallopian tube with metastatic tumor deposits - 2 benign lymph nodes  F. RECTAL TUMOR IMPLANT, EXCISION: - Metastatic high-grade serous carcinoma   G. COLON, RECTOSIGMOID ANASTOMOTIC RINGS: - Benign anastomotic donuts  ONCOLOGY TABLE:   OVARY or FALLOPIAN TUBE or PRIMARY PERITONEUM: Resection  Procedure: Total abdominal hysterectomy, bilateral salpingo-oophorectomy, appendectomy, rectosigmoid resection, total omentectomy,  small bowel nodule resection Specimen Integrity: Intact Tumor Site: Bilateral Tumor Size: Right ovary: 3.9 cm, left ovary: Approximately 1.2 cm Histologic Type: High-grade serous carcinoma Histologic Grade: High-grade Ovarian Surface Involvement: Present Fallopian Tube Surface Involvement: Present Implants: Present, small bowel mesenteric and rectovaginal septum Lymphatic and/or Vascular Invasion: Not identified Other Tissue/ Organ Involvement: Fallopian tube, bilateral Largest Extrapelvic Peritoneal Focus: 1.4 cm, small bowel mesentery Peritoneal/Ascitic Fluid Involvement: Not applicable Chemotherapy Response Score (CRS): Not applicable, no known presurgical therapy Regional Lymph Nodes: Not applicable (no lymph nodes submitted or found) Distant Metastasis:      Distant Site(s) Involved: Not applicable Pathologic Stage Classification (pTNM, AJCC 8th Edition): pT3b, pN[not assigned] Ancillary Studies: Can be performed upon request Representative Tumor Block: D8 Comment(s): Tumor is staged as pT3 given the presence of extrapelvic peritoneal deposits on small bowel mesentery less than 2cm (1.4 cm) ([id number])    09/18/2022 Tumor Marker   Patient's tumor was tested for the following markers: CA-125. Results of the tumor marker test revealed 36.8.   10/15/2022 Genetic Testing   Negative [facility] Multi-Cancer +RNA Panel.  VUS in POLD1 at c.1322C>T (p.Thr441Met). Report date is 10/15/2022.   The Multi-Cancer + RNA Panel offered by [facility] includes sequencing and/or deletion/duplication analysis of the following 70 genes:  AIP*, ALK, APC*, ATM*,  AXIN2*, BAP1*, BARD1*, BLM*, BMPR1A*, BRCA1*, BRCA2*, BRIP1*, CDC73*, CDH1*, CDK4, CDKN1B*, CDKN2A, CHEK2*, CTNNA1*, DICER1*, EPCAM (del/dup only), EGFR, FH*, FLCN*, GREM1 (promoter dup only), HOXB13, KIT, LZTR1, MAX*, MBD4, MEN1*, MET, MITF, MLH1*, MSH2*, MSH3*, MSH6*, MUTYH*, NF1*, NF2*, NTHL1*, PALB2*, PDGFRA, PMS2*, POLD1*, POLE*, POT1*, PRKAR1A*,  PTCH1*, PTEN*, RAD51C*, RAD51D*, RB1*, RET, SDHA* (sequencing only), SDHAF2*, SDHB*, SDHC*, SDHD*, SMAD4*, SMARCA4*, SMARCB1*, SMARCE1*, STK11*, SUFU*, TMEM127*, TP53*, TSC1*, TSC2*, VHL*. RNA analysis is performed for * genes.   11/06/2022 Tumor Marker   Patient's tumor was tested for the following markers: CA-125. Results of the tumor marker test revealed 16.8.   12/11/2022 Tumor Marker   Patient's tumor was tested for the following markers: CA-125. Results of the tumor marker test revealed 12.5.   12/14/2022 Imaging   CT ABDOMEN PELVIS W CONTRAST  Result Date: 12/13/2022 CLINICAL DATA:  Ovarian cancer restaging * Tracking Code: BO * EXAM: CT ABDOMEN AND PELVIS WITH CONTRAST TECHNIQUE: Multidetector CT imaging of the abdomen and pelvis was performed using the standard protocol following bolus administration of intravenous contrast. RADIATION DOSE REDUCTION: This exam was performed according to the departmental dose-optimization program which includes automated exposure control, adjustment of the mA and/or kV according to patient size and/or use of iterative reconstruction technique. CONTRAST:  OMNIPAQUE  IOHEXOL  300 MG/ML  SOLN COMPARISON:  08/02/2022 FINDINGS: Lower chest: Unremarkable Hepatobiliary: Unremarkable Pancreas: Unremarkable Spleen: Unremarkable Adrenals/Urinary Tract: 2.6 by 2.1 cm right adrenal mass, internal density 55 Hounsfield units on portal venous phase images, relative washout of 13% which is indeterminate. Back in 2016 I measure this lesion at 2.2 by 1.6 cm, on the more recent comparison of 07/18/2022 this lesion measured 2.6 by 2.1 cm similar to today. Accordingly this lesion is stable from March and only very minimally increased in size from 2016, most likely benign. This can likely be surveilled in the context of the patient's ongoing oncology CT examinations. Duplicated right renal collecting system with on going moderate hydroureter of both proximal ureters, and borderline  hydronephrosis in the lower pole moiety. The ureters transition to relatively normal caliber in the vicinity of the iliac vessel cross over, a specific cause for the transition is not identified. There is also a duplicated left renal collecting system without hydroureter on the left Stable 7 mm calcification or stone in the vicinity of the left ureterovesical junction. Stomach/Bowel: Prominent stool throughout the colon favors constipation. Anastomotic staple line in the sigmoid colon. No dilated small bowel. Interval appendectomy. Previous nodularity along bowel including the sigmoid colon no longer identified. Vascular/Lymphatic: Atherosclerosis is present, including aortoiliac atherosclerotic disease. No pathologic adenopathy. Reproductive: Interval hysterectomy with salpingo-oophorectomy. Other: Interval omentectomy. No current compelling findings of peritoneal tumor deposits. No ascites. Musculoskeletal: Prior fusion at L4-L5-S1. IMPRESSION: 1. Interval hysterectomy, omentectomy, and appendectomy. No compelling findings of recurrent or residual peritoneal tumor deposits. 2. 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 A specific cause for the transition in caliber is not identified. 3. Stable 7 mm calcification or stone in the vicinity of the left ureterovesical junction. Duplicated left renal collecting system. 4. Prominent stool throughout the colon favors constipation. 5. Chronically stable but otherwise nonspecific right adrenal mass, likely benign. 6. Aortic atherosclerosis. Aortic Atherosclerosis (ICD10-I70.0). Electronically Signed   By: Ryan Salvage M.D.   On: 12/13/2022 15:16      12/20/2022 Procedure   Successful removal of  a RIGHT chest implanted Port-A-Cath.    03/19/2023 Tumor Marker   Patient's tumor was tested for the following  markers: CA-125. Results of the tumor marker test revealed 19.7.   06/14/2023 Imaging   CT ABDOMEN PELVIS W CONTRAST Result Date: 06/21/2023 CLINICAL DATA:  Restaging ovarian cancer.  * Tracking Code: BO * EXAM: CT ABDOMEN AND PELVIS WITH CONTRAST TECHNIQUE: Multidetector CT imaging of the abdomen and pelvis was performed using the standard protocol following bolus administration of intravenous contrast. RADIATION DOSE REDUCTION: This exam was performed according to the departmental dose-optimization program which includes automated exposure control, adjustment of the mA and/or kV according to patient size and/or use of iterative reconstruction technique. CONTRAST:  OMNIPAQUE  IOHEXOL  300 MG/ML  SOLN COMPARISON:  CT abdomen and pelvis dated 12/08/2022 FINDINGS: Lower chest: No focal consolidation or pulmonary nodule in the lung bases. No pleural effusion or pneumothorax demonstrated. Partially imaged heart size is normal. Hepatobiliary: No focal hepatic lesions. No intra or extrahepatic biliary ductal dilation. Normal gallbladder. Pancreas: No focal lesions or main ductal dilation. Spleen: Normal in size without focal abnormality. Adrenals/Urinary Tract: Unchanged 2.0 cm right adrenal nodule measuring 43 HU (2:16). No left adrenal nodule. 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 Duplex left kidney. No hydronephrosis. Unchanged 7 mm calcification adjacent to the left ureterovesical junction. No focal bladder wall thickening. Stomach/Bowel: Normal appearance of the stomach. Postsurgical changes from rectosigmoid resection with patent anastomosis. No evidence of bowel wall thickening, distention, or inflammatory changes. Appendectomy. Vascular/Lymphatic: Aortic atherosclerosis. New and increased size of multi station lymphadenopathy and peritoneal nodules, for example: -12 mm cardiophrenic (2:2), previously partially imaged at 3 mm -new 12 mm midline epigastric anterior peritoneal (2:23) -new  3.1 x 2.9 cm peripancreatic (2:25) -14 mm right pelvic sidewall (2:56), previously 4 mm -11 mm right external iliac (2:60), previously 7 mm Reproductive: Status post hysterectomy and bilateral salpingo-oophorectomy. No adnexal masses. Increased lobulated soft tissue density adjacent to the right vaginal cuff measuring 2.9 x 1.8 cm (2:64), previously 2.4 x 1.7 cm Other: Status post total omentectomy. No free fluid, fluid collection, or free air. Musculoskeletal: No acute or abnormal lytic or blastic osseous lesions. Postsurgical changes of L4-S1 spinal fixation. Hardware appears intact. Postsurgical changes of the anterior abdominal wall. IMPRESSION: 1. New and increased size of multi station lymphadenopathy and peritoneal nodules, consistent with metastatic disease. 2. Increased lobulated soft tissue density adjacent to the right vaginal cuff measuring 2.9 x 1.8 cm, previously 2.4 x 1.7 cm, suspicious for recurrent disease. 3. Unchanged 2.0 cm right adrenal nodule, favored benign. Recommend continued attention on follow-up. 4. 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 Duplex left kidney. No hydronephrosis. Unchanged 7 mm calcification adjacent to the left ureterovesical junction. 5.  Aortic Atherosclerosis (ICD10-I70.0). Electronically Signed   By: Limin  Xu M.D.   On: 06/21/2023 09:35      06/18/2023 Tumor Marker   Patient's tumor was tested for the following markers: CA-125. Results of the tumor marker test revealed 32.9.   07/03/2023 Procedure   Successful placement of a power injectable Port-A-Cath via the right internal jugular vein. The catheter is ready for immediate use.   07/04/2023 - 08/31/2023 Chemotherapy   Patient is on Treatment Plan : OVARIAN RECURRENT 3RD LINE Bevacizumab  D1 + Carboplatin  D1 + Gemcitabine  D1,8 (4/800) q21d     07/13/2023 Tumor Marker   Patient's tumor was tested for the following markers: CA-125. Results of the tumor marker test revealed 33.2.   08/17/2023  Tumor  Marker   Patient's tumor was tested for the following markers: CA-125. Results of the tumor marker test revealed 16.1.   08/30/2023 Imaging   1. Status post hysterectomy and bilateral salpingo-oophorectomy. 2. Multistation adenopathy and peritoneal nodularity is again noted, compatible with metastatic disease. Mild increased size of peripancreatic node. Upper abdominal peritoneal nodule is slightly decreased in size in the interval. Additional index lymph nodes are stable in the interval. 3. Increased soft tissue density adjacent to the vaginal cuff, concerning for locally recurrent disease. 4. Duplex right kidney with mild hydronephrosis and hydroureter, unchanged. Stone within the left UVJ is again noted measuring 0.8 x 0.4 cm, unchanged from previous exam. 5. Stable right adrenal nodule.  None 6. Supraumbilical ventral abdominal wall hernia containing a nonobstructed loop of small bowel. 7.  Aortic Atherosclerosis (ICD10-I70.0).       09/27/2023 - 11/15/2023 Chemotherapy   Patient is on Treatment Plan : OVARIAN Paclitaxel  (80) I8,1,84,77 q28d     09/28/2023 Tumor Marker   Patient's tumor was tested for the following markers: CA-125. Results of the tumor marker test revealed 29.8.   11/05/2023 Tumor Marker   Patient's tumor was tested for the following markers: CA-125. Results of the tumor marker test revealed 40.   11/16/2023 Tumor Marker   Patient's tumor was tested for the following markers: CA-125. Results of the tumor marker test revealed 32.3.   11/22/2023 Imaging   CT ABDOMEN PELVIS W CONTRAST Result Date: 11/28/2023 CLINICAL DATA:  Ovarian cancer.  * Tracking Code: BO * EXAM: CT ABDOMEN AND PELVIS WITH CONTRAST TECHNIQUE: Multidetector CT imaging of the abdomen and pelvis was performed using the standard protocol following bolus administration of intravenous contrast. RADIATION DOSE REDUCTION: This exam was performed according to the departmental dose-optimization  program which includes automated exposure control, adjustment of the mA and/or kV according to patient size and/or use of iterative reconstruction technique. CONTRAST:  OMNIPAQUE  IOHEXOL  300 MG/ML  SOLN COMPARISON:  08/30/2023. FINDINGS: Lower chest: Lung bases are clear. Prepericardiac lymph node measures 1.6 cm, enlarged from 1.1 cm on 08/30/2023. Heart is at the upper limits of normal in size. No pericardial or pleural effusion. Distal esophagus is grossly unremarkable. Hepatobiliary: Liver and gallbladder are unremarkable. No biliary ductal dilatation. Pancreas: Negative. Spleen: Negative. Adrenals/Urinary Tract: 2.4 cm right adrenal nodule, chronically stable and characterized previously 2 as an adenoma. No specific follow-up necessary. Minimal nodular thickening of the left adrenal gland, unchanged. Minimal scarring in the right kidney. Kidneys are otherwise unremarkable. Difficulty did right ureter appears to join in the midportion. Right ureter is decompressed. Left ureter may be minimally dilated secondary to an 8 mm stone at the left ureteral orifice, unchanged from 08/30/2023. Bladder is otherwise grossly unremarkable. Stomach/Bowel: Stomach, small bowel and colon are unremarkable. Appendix is not readily visualized. Vascular/Lymphatic: Atherosclerotic calcification of the aorta. Heterogeneous nodal mass or peritoneal implant between the distal stomach and pancreas measures 3.7 x 5.3 cm (2/29), enlarged from 3.0 x 3.7 cm. Right external iliac lymph node measures 7 mm (2/58), previously 12 mm. A second right external iliac lymph node measures 7 mm (2/64), stable. Reproductive: Hysterectomy. Similar asymmetric nodular soft tissue along the right vaginal cuff (2/66-67). No adnexal mass. Other: Previously seen ventral midline epigastric peritoneal nodule is no longer visualized. No free fluid. Small incisional hernia contains fat along the ventral supraumbilical midline. Musculoskeletal: Osteopenia.  Degenerative changes in the spine. L5-S1 fusion. IMPRESSION: 1. Enlarging prepericardiac nodal metastasis. Enlarging nodal mass or peritoneal implant between the  distal stomach and pancreas. 2. Resolved ventral midline epigastric peritoneal metastasis. 3. Right external iliac lymph nodes are stable to smaller in size. 4. Asymmetric nodular thickening of the right vaginal cuff, stable. 5. Trace left ureteral dilatation with a chronic 8 mm stone at the left ureteral orifice. 6. Stable right adrenal nodule, previously characterized as an adenoma. 7.  Aortic atherosclerosis (ICD10-I70.0). Electronically Signed   By: Newell Eke M.D.   On: 11/28/2023 13:56      02/22/2024 Imaging   CT ABDOMEN PELVIS W CONTRAST Result Date: 02/24/2024 CLINICAL DATA:  Ovarian cancer, monitor, off chemotherapy. * Tracking Code: BO * EXAM: CT ABDOMEN AND PELVIS WITH CONTRAST TECHNIQUE: Multidetector CT imaging of the abdomen and pelvis was performed using the standard protocol following bolus administration of intravenous contrast. RADIATION DOSE REDUCTION: This exam was performed according to the departmental dose-optimization program which includes automated exposure control, adjustment of the mA and/or kV according to patient size and/or use of iterative reconstruction technique. CONTRAST:  OMNIPAQUE  IOHEXOL  300 MG/ML  SOLN COMPARISON:  Multiple priors including CT November 22, 2023 FINDINGS: Lower chest: No acute abnormality. Centrally necrotic pericardiophrenic lymph node measures 18 mm in short axis on image 3/2 previously 16 mm Hepatobiliary: No suspicious hepatic lesion. Gallbladder is unremarkable. No biliary ductal dilation. Pancreas: No pancreatic ductal dilation or evidence of acute inflammation. Spleen: No splenomegaly. Adrenals/Urinary Tract: 2.1 cm right adrenal nodule on image 20/2 stable over multiple prior examinations and previously characterized as a benign adenoma. Left adrenal gland appears normal.  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 Similar prominence of the left ureter with 8 mm stone at the UVJ. Stomach/Bowel: Stomach is minimally distended limiting evaluation. No pathologic dilation of small or large bowel. No evidence of acute bowel inflammation. Vascular/Lymphatic: Normal caliber abdominal aorta. Smooth IVC contours. Narrowing of the portal vein and portal confluence by a heterogeneous enlarged lymph node/mass which measures 7.4 x 6.5 cm on image 27/2 previously 4.7 x 4.1 cm when remeasured for consistency, this sits between the pancreatic head and distal stomach. Previously indexed right external iliac lymph node measures 9 mm in short axis on image 58/2 previously 7 mm -additional right external iliac lymph node previously indexed measures 6 mm in short axis on image 66/2 previously 7 mm. Reproductive: Prior hysterectomy with similar asymmetric nodular soft tissue on the right side of the vaginal cuff on image 67/2. no suspicious adnexal mass. Other: No significant abdominopelvic free fluid. Postsurgical change in the anterior abdominal wall with fat containing incisional hernia. Musculoskeletal: No aggressive lytic or blastic lesion of bone. Spinal fusion hardware. Diffuse demineralization of bone. Thoracolumbar spondylosis. IMPRESSION: 1. Interval increase in size of the heterogeneous enlarged lymph node/mass between the pancreatic head and distal stomach. 2. Slight interval increase in size of the centrally necrotic pericardiophrenic lymph node. 3. Previously indexed right external iliac lymph nodes are similar to slightly increased in size. 4. Prior hysterectomy with similar asymmetric nodular soft tissue on the right side of the vaginal cuff. 5. Similar prominence of the left ureter with 8 mm stone at the UVJ. 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 Electronically Signed   By: Reyes Holder M.D.   On:  02/24/2024 13:03   MR BRAIN W WO CONTRAST Result Date: 02/18/2024 CLINICAL DATA:  Multiple sclerosis. EXAM: MRI HEAD WITHOUT AND WITH CONTRAST TECHNIQUE: Multiplanar, multiecho pulse sequences of the brain and surrounding structures were obtained without and with  intravenous contrast. CONTRAST:  8 mL [employer]way  COMPARISON:  Head MRI 10/26/2022 FINDINGS: Brain: There is no evidence of an acute infarct, intracranial hemorrhage, mass, midline shift, or extra-axial fluid collection. Cerebral volume is within normal limits for age. The ventricles are normal in size. T2 hyperintensities in the juxtacortical, periventricular, and deep cerebral white matter bilaterally and in the pons are unchanged and moderately advanced for age. None show enhancement or restricted diffusion. Mild T2 heterogeneity in the thalami and at most minimal in the cerebellum are unchanged. Vascular: Major intracranial vascular flow voids are preserved. Skull and upper cervical spine: Unremarkable bone marrow signal. Sinuses/Orbits: Unremarkable orbits. Paranasal sinuses and mastoid air cells are clear. Other: None. IMPRESSION: Unchanged moderately advanced white matter disease, nonspecific but may reflect a combination chronic demyelinating disease and chronic small vessel ischemia. No evidence of active demyelination. Electronically Signed   By: Dasie Hamburg M.D.   On: 02/18/2024 08:06      03/29/2024 Imaging   US  Venous Img Upper Uni Left (DVT) Result Date: 04/01/2024 CLINICAL DATA:  LEFT upper extremity pain. History of tobacco use and ovarian malignancy. EXAM: LEFT UPPER EXTREMITY VENOUS DOPPLER ULTRASOUND TECHNIQUE: Gray-scale sonography with graded compression, as well as color Doppler and duplex ultrasound were performed to evaluate the upper extremity deep venous system from the level of the subclavian vein and including the jugular, axillary, basilic, radial, ulnar and upper cephalic vein. Spectral Doppler was utilized to evaluate  flow at rest and with distal augmentation maneuvers. COMPARISON:  None available FINDINGS: Internal Jugular Vein: No evidence of thrombus. Normal compressibility, respiratory phasicity and response to augmentation. Subclavian Vein: No evidence of thrombus. Normal compressibility, respiratory phasicity and response to augmentation. Axillary Vein: No evidence of thrombus. Normal compressibility, respiratory phasicity and response to augmentation. Cephalic Vein: Intramural filling defect with decreased flow and compressibility of the cephalic vein and adjacent varicose vein at the level of the mid upper arm is consistent with acute DVT. This also corresponds to the patient's palpable abnormality. Basilic Vein: No evidence of thrombus. Normal compressibility, respiratory phasicity and response to augmentation. Brachial Veins: No evidence of thrombus. Normal compressibility, respiratory phasicity and response to augmentation. Radial Veins: No evidence of thrombus. Normal compressibility, respiratory phasicity and response to augmentation. Ulnar Veins: No evidence of thrombus. Normal compressibility, respiratory phasicity and response to augmentation. Venous Reflux:  None visualized. Other Findings:  None visualized. IMPRESSION: 1. No DVT of the LEFT upper extremity. 2. Acute superficial venous thrombosis of the cephalic vein and adjacent varicose vein at the level of the mid upper arm. Electronically Signed   By: Aliene Lloyd M.D.   On: 04/01/2024 07:35   CT ANGIO GI BLEED Result Date: 03/29/2024 CLINICAL DATA:  Lightheadedness, dark stool for 1 month, epigastric pain for several days, history of ovarian cancer EXAM: CTA ABDOMEN AND PELVIS WITHOUT AND WITH CONTRAST TECHNIQUE: Multidetector CT imaging of the abdomen and pelvis was performed using the standard protocol during bolus administration of intravenous contrast. Multiplanar reconstructed images and MIPs were obtained and reviewed to evaluate the vascular  anatomy. RADIATION DOSE REDUCTION: This exam was performed according to the departmental dose-optimization program which includes automated exposure control, adjustment of the mA and/or kV according to patient size and/or use of iterative reconstruction technique. CONTRAST:  OMNIPAQUE  IOHEXOL  350 MG/ML SOLN COMPARISON:  02/22/2024 FINDINGS: VASCULAR Aorta: Normal caliber aorta without aneurysm, dissection, vasculitis or significant stenosis. Mild atherosclerosis. Celiac: Patent without evidence of aneurysm, dissection, vasculitis or significant stenosis. SMA: Patent without  evidence of aneurysm, dissection, vasculitis or significant stenosis. Renals: Both renal arteries are patent without evidence of aneurysm, dissection, vasculitis, fibromuscular dysplasia or significant stenosis. IMA: Patent without evidence of aneurysm, dissection, vasculitis or significant stenosis. Inflow: Patent without evidence of aneurysm, dissection, vasculitis or significant stenosis. Evaluation is slightly limited due to streak artifact from lumbar orthopedic hardware. Proximal Outflow: Bilateral common femoral and visualized portions of the superficial and profunda femoral arteries are patent without evidence of aneurysm, dissection, vasculitis or significant stenosis. Veins: Continue mass effect upon the portal vein confluence due to the large mass between the duodenum and pancreatic head. No other acute venous abnormalities. Review of the MIP images confirms the above findings. NON-VASCULAR Lower chest: Pericardiophrenic lymph node is again identified, measuring 2.4 x 2.2 cm, previously 2.5 x 2.0 cm. No acute airspace disease. Hepatobiliary: No focal liver abnormality is seen. No gallstones, gallbladder wall thickening, or biliary dilatation. Pancreas: Unremarkable. No pancreatic ductal dilatation or surrounding inflammatory changes. Spleen: Normal in size without focal abnormality. Adrenals/Urinary Tract: Kidneys enhance  normally. No urinary tract calculi. 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 No left-sided obstruction. 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 No specific follow-up is required. Left adrenal is unremarkable. Bladder is normal. Stomach/Bowel: No bowel obstruction or ileus. Prior sigmoid colon resection and reanastomosis. Previous appendectomy. There is a large necrotic 7.8 x 8.7 x 8.1 cm mass interposed between the head of the pancreas and duodenal ball. This mass displaces and likely invades the wall of the distal stomach and proximal duodenum, and may be the source for chronic blood loss. However, on today's exam, there is no evidence of contrast accumulation within the bowel lumen to suggest active hemorrhage. Lymphatic: Previous index enlarged lymph node in the right external iliac chain now measures 12 mm in short axis reference image 62/13, previously 9 mm. Stable right external iliac/inguinal lymph node image 69/13 measuring 6 mm in short axis. No new adenopathy is identified. Reproductive: Stable postsurgical changes from hysterectomy. Other: No free fluid or free intraperitoneal gas. Enlarged para incisional hernia in the right supraumbilical region, now containing a short segment of small bowel. No evidence of incarceration or obstruction. Musculoskeletal: Stable postsurgical changes within the lower lumbar spine. There are no acute or destructive bony abnormalities. Reconstructed images demonstrate no additional findings. IMPRESSION: VASCULAR 1. No evidence of active gastrointestinal bleeding. 2.  Aortic Atherosclerosis (ICD10-I70.0). NON-VASCULAR 1. Enlarging necrotic soft tissue mass interposed between the head of the pancreas and proximal duodenum. There is likely invasion of the dorsal wall of the distal stomach and duodenum, and this could be a source for chronic blood loss given clinical history. There is no evidence  of active hemorrhage on today's exam. 2. Progressive adenopathy within the pericardiophrenic region and right external iliac chain consistent with worsening metastatic disease. 3. Para incisional hernia in the right supraumbilical region, containing a short segment of small bowel. No incarceration or obstruction. Electronically Signed   By: Ozell Daring M.D.   On: 03/29/2024 16:19   DG Chest 2 View Result Date: 03/29/2024 CLINICAL DATA:  Chest pain. EXAM: CHEST - 2 VIEW COMPARISON:  Chest CT dated 09/19/2022. FINDINGS: Right-sided Port-A-Cath with tip over central SVC. No focal consolidation, pleural effusion, pneumothorax. The cardiac silhouette is within normal limits. No acute osseous pathology. Degenerative changes of the shoulders. IMPRESSION: No active cardiopulmonary disease. Electronically Signed   By: Vanetta Chou M.D.   On: 03/29/2024 13:01  06/17/2024 - 06/17/2024 Chemotherapy   Patient is on Treatment Plan : OVARIAN Liposomal Doxorubicin (40) q28d       ALLERGIES:  is allergic to cymbalta [duloxetine hcl], cyclobenzaprine hcl, celexa  [citalopram hydrobromide], fluticasone  furoate-vilanterol, gabapentin, lyrica  cr [pregabalin  er], opana  [oxymorphone hcl], and tramadol.  MEDICATIONS:  Current Facility-Administered Medications  Medication Dose Route Frequency Provider Last Rate Last Admin   acetaminophen  (TYLENOL ) tablet 650 mg  650 mg Oral Q6H PRN Jens Durand, MD       Or   acetaminophen  (TYLENOL ) suppository 650 mg  650 mg Rectal Q6H PRN Jens Durand, MD       albuterol  (PROVENTIL ) (2.5 MG/3ML) 0.083% nebulizer solution 2.5 mg  2.5 mg Nebulization Q6H PRN Jens Durand, MD       dexamethasone  (DECADRON ) tablet 4 mg  4 mg Oral Daily Jens Durand, MD       HYDROmorphone  (DILAUDID ) injection 1 mg  1 mg Intravenous Q3H PRN Jens Durand, MD   1 mg at 06/11/24 1217   hydroxychloroquine  (PLAQUENIL ) tablet 200 mg  200 mg Oral BID Jens Durand, MD   200 mg at 06/11/24  1220   LORazepam  (ATIVAN ) tablet 0.5 mg  0.5 mg Oral TID PRN Jens Durand, MD   0.5 mg at 06/10/24 2201   mirtazapine  (REMERON ) tablet 7.5 mg  7.5 mg Oral QHS Jens Durand, MD   7.5 mg at 06/10/24 2201   naloxone  (NARCAN ) nasal spray 4 mg/0.1 mL  1 spray Nasal PRN Jens Durand, MD       oxyCODONE  (Oxy IR/ROXICODONE ) immediate release tablet 20 mg  20 mg Oral 5 X Daily PRN Jens Durand, MD       oxyCODONE  (OXYCONTIN ) 12 hr tablet 15 mg  15 mg Oral Q12H Ayiku, Bernard, MD   15 mg at 06/10/24 2201   pantoprazole  (PROTONIX ) EC tablet 40 mg  40 mg Oral Daily Ayiku, Bernard, MD   40 mg at 06/11/24 1220   polyethylene glycol (MIRALAX  / GLYCOLAX ) packet 17 g  17 g Oral Daily PRN Jens Durand, MD       prochlorperazine  (COMPAZINE ) tablet 10 mg  10 mg Oral Q6H PRN Jens Durand, MD       NOREEN ON 06/15/2024] Vitamin D  (Ergocalciferol ) (DRISDOL ) 1.25 MG (50000 UNIT) capsule 50,000 Units  50,000 Units Oral Q Austin Jens Durand, MD       Current Outpatient Medications  Medication Sig Dispense Refill   albuterol  (VENTOLIN  HFA) 108 (90 Base) MCG/ACT inhaler Inhale 2 puffs into the lungs every 6 (six) hours as needed for shortness of breath.     amphetamine -dextroamphetamine  (ADDERALL ) 20 MG tablet Take 10-20 mg by mouth daily as needed. Take 20 mg by mouth in the morning and 10 mg between 1-2 PM daily     Ascorbic Acid (VITAMIN C) 1000 MG tablet Take 1,000 mg by mouth daily.     ASHWAGANDHA PO Take 1 capsule by mouth daily.     atenolol  (TENORMIN ) 25 MG tablet Take 25 mg by mouth daily.     baclofen  (LIORESAL ) 10 MG tablet Take 10 mg by mouth 3 (three) times daily as needed.     BLACK CURRANT SEED OIL PO Take 1 capsule by mouth daily.     dexamethasone  (DECADRON ) 4 MG tablet Take 1 tablet (4 mg total) by mouth daily. 30 tablet 6   diclofenac Sodium (VOLTAREN) 1 % GEL Apply 2 g topically 3 (three) times daily.     hydroxychloroquine  (PLAQUENIL ) 200 MG  tablet Take 200 mg by mouth 2 (two) times daily.      lidocaine -prilocaine  (EMLA ) cream Apply to affected area once 30 g 3   LORazepam  (ATIVAN ) 0.5 MG tablet Take 0.5 mg by mouth 3 (three) times daily as needed for anxiety.     mirtazapine  (REMERON ) 7.5 MG tablet Take 7.5 mg by mouth at bedtime.     Multiple Vitamin (MULTIVITAMIN WITH MINERALS) TABS tablet Take 1 tablet by mouth daily.     naloxone  (NARCAN ) nasal spray 4 mg/0.1 mL Place 1 spray into the nose as needed (opioid overdose).     ondansetron  (ZOFRAN ) 8 MG tablet Take 1 tablet (8 mg total) by mouth every 8 (eight) hours as needed for nausea or vomiting. 30 tablet 1   OVER THE COUNTER MEDICATION Take 2 capsules by mouth daily. [place] [employer] advanced supplement     oxyCODONE  ER (XTAMPZA  ER) 13.5 MG C12A Take 13.5 mg by mouth in the morning, at noon, and at bedtime.     Oxycodone  HCl 20 MG TABS Take 20 mg by mouth 5 (five) times daily as needed (pain).     polyethylene glycol (MIRALAX  / GLYCOLAX ) 17 g packet Take 17 g by mouth daily.     prochlorperazine  (COMPAZINE ) 10 MG tablet Take 1 tablet (10 mg total) by mouth every 6 (six) hours as needed for nausea or vomiting. 30 tablet 1   Vitamin D , Ergocalciferol , (DRISDOL ) 1.25 MG (50000 UNIT) CAPS capsule Take 50,000 Units by mouth every Sunday.     WIXELA INHUB 250-50 MCG/ACT AEPB Inhale 1 puff into the lungs daily.     omeprazole  (PRILOSEC) 20 MG capsule TAKE 1 CAPSULE BY MOUTH EVERY DAY 90 capsule 1    VITAL SIGNS: BP 122/76   Pulse 94   Temp 99 F (37.2 C) (Oral)   Resp (!) 21   Ht 5' 9 (1.753 m)   Wt 145 lb (65.8 kg)   SpO2 96%   BMI 21.41 kg/m  Filed Weights   06/10/24 1145  Weight: 145 lb (65.8 kg)    Estimated body mass index is 21.41 kg/m as calculated from the following:   Height as of this encounter: 5' 9 (1.753 m).   Weight as of this encounter: 145 lb (65.8 kg).  LABS: CBC:    Component Value Date/Time   WBC 7.6 06/11/2024 0602   HGB 8.2 (L) 06/11/2024 0602   HGB 5.9 (LL) 06/04/2024 1250   HCT 26.0 (L)  06/11/2024 0602   PLT 259 06/11/2024 0602   PLT 264 06/04/2024 1250   MCV 89.3 06/11/2024 0602   NEUTROABS 5.4 06/04/2024 1250   LYMPHSABS 1.2 06/04/2024 1250   MONOABS 0.8 06/04/2024 1250   EOSABS 0.0 06/04/2024 1250   BASOSABS 0.0 06/04/2024 1250   Comprehensive Metabolic Panel:    Component Value Date/Time   NA 131 (L) 06/11/2024 0443   K 4.7 06/11/2024 0443   CL 99 06/11/2024 0443   CO2 22 06/11/2024 0443   BUN 10 06/11/2024 0443   CREATININE 0.67 06/11/2024 0443   CREATININE 0.84 05/20/2024 1413   GLUCOSE 108 (H) 06/11/2024 0443   CALCIUM 7.6 (L) 06/11/2024 0443   AST 31 06/10/2024 1237   AST 21 05/20/2024 1413   ALT 13 06/10/2024 1237   ALT 10 05/20/2024 1413   ALKPHOS 98 06/10/2024 1237   BILITOT 0.6 06/10/2024 1237   BILITOT 0.3 05/20/2024 1413   PROT 5.7 (L) 06/10/2024 1237   ALBUMIN  2.8 (L) 06/10/2024 1237  RADIOGRAPHIC STUDIES: CT Angio Abd/Pel W and/or Wo Contrast Result Date: 06/10/2024 CLINICAL DATA:  Metastatic ovarian cancer with worsening abdominal pain EXAM: CTA ABDOMEN AND PELVIS WITHOUT AND WITH CONTRAST TECHNIQUE: Multidetector CT imaging of the abdomen and pelvis was performed using the standard protocol during bolus administration of intravenous contrast. Multiplanar reconstructed images and MIPs were obtained and reviewed to evaluate the vascular anatomy. RADIATION DOSE REDUCTION: This exam was performed according to the departmental dose-optimization program which includes automated exposure control, adjustment of the mA and/or kV according to patient size and/or use of iterative reconstruction technique. CONTRAST:  OMNIPAQUE  IOHEXOL  350 MG/ML SOLN COMPARISON:  CTA chest dated 06/06/2024, CTA abdomen and pelvis dated 06/05/2024 and multiple priors FINDINGS: VASCULAR Aorta: Normal caliber aorta without aneurysm, dissection, vasculitis or significant stenosis. Mild aortic atherosclerosis. Celiac: Patent without evidence of aneurysm, dissection,  vasculitis or significant stenosis. Prior gastroduodenal artery embolization. SMA: Patent without evidence of aneurysm, dissection, vasculitis or significant stenosis. Renals: Single bilateral renal arteries. Both renal arteries are patent without evidence of aneurysm, dissection, vasculitis, fibromuscular dysplasia or significant stenosis. IMA: Patent without evidence of aneurysm, dissection, vasculitis or significant stenosis. Inflow: Mild narrowing of the bilateral internal iliac artery origins due to atherosclerotic plaque. Otherwise patent without evidence of aneurysm, dissection, vasculitis or significant stenosis. Proximal Outflow: Bilateral common femoral and visualized portions of the superficial and profunda femoral arteries are patent without evidence of aneurysm, dissection, vasculitis or significant stenosis. Veins: Interval placement of infrarenal IVC filter. Focal filling defect within the right great saphenous vein immediately distal to the saphenofemoral junction (16:197), which appears new compared to 06/05/2024. Filling defect within the left common femoral vein (16:210). Review of the MIP images confirms the above findings. NON-VASCULAR Lower chest: No focal consolidation or pulmonary nodule in the lung bases. No pleural effusion or pneumothorax demonstrated. Partially imaged heart size is normal. Unchanged partially imaged bilateral lower lobe pulmonary emboli. Unchanged 3.4 x 2.5 cm right cardiophrenic nodule (16: 14). Hepatobiliary: Again seen are heterogeneously enhancing bilobar hepatic masses. Index lesions: -3.8 x 2.8 cm segment 7/8 (16:38) unchanged when remeasured -9.5 x 6.2 cm segment 4 (16:29), previously 8.7 x 6.6 cm -6.2 x 4.5 cm segment 2 (16:27), previously 5.4 x 4.2 cm No intra or extrahepatic biliary ductal dilation. Gallbladder is laterally displaced. Mild gallbladder mural thickening and pericholecystic free fluid. Pancreas: No focal lesions or main ductal dilation. Spleen:  Normal in size without focal abnormality. Adrenals/Urinary Tract: Unchanged right adrenal nodule size measuring 2.9 x 2.1 cm (16:51) when remeasured. Nodule measures 23 HU precontrast. No left adrenal nodule. Duplex kidneys. No suspicious renal mass or hydronephrosis. Unchanged linear 7 mm calculus at the left ureterovesical junction (16:171). No focal bladder wall thickening. Stomach/Bowel: Normal appearance of the stomach. Again seen is heterogeneously enhancing mass within the right hemiabdomen centered at the proximal duodenum measuring 11.0 x 8.6 cm (16:78), previously 9.8 x 9.2 cm when measured similarly. The mass continues exerts local mass effect with lateral displacement of the gallbladder and continued effacement of the SMV portal confluence. Findings suspicious for interval invasion of the proximal transverse colon with marked luminal narrowing. Along the periphery of this mass, there are areas of irregular arterial hyperenhancement, for example posteroinferior (9:110), which expands on the venous phase image (16:107). Appendix is not discretely seen. Lymphatic: Slightly increased size of right pelvic sidewall nodule measuring 1.6 x 1.4 cm (16:152), previously 1.5 x 1.2 cm. Unchanged 1.0 cm left para-aortic lymph node (16:86). Increased size of right lower  quadrant nodule measuring 3.5 x 2.6 cm (16:130), previously 3 1 x 2.5 cm (remeasured). Reproductive: Status post hysterectomy and bilateral salpingo oophorectomy. Unchanged 2.0 x 1.8 cm nodular soft tissue focus, inseparable from the right vaginal cuff (16:163). Other: Trace right hemi abdominal free fluid. No fluid collection. Asymmetric thickening of the right anterior peritoneum adjacent to the duodenal mass. Musculoskeletal: No acute or abnormal lytic or blastic osseous lesions. Postsurgical changes of L4-S1 spinal fusion. Hardware appears intact. Subcutaneous soft tissue stranding and small focus of hyperdensity in the right gluteal region, likely  postprocedural. Mild body wall edema. Small fat-containing paraumbilical hernia. IMPRESSION: 1. No acute aortic pathology. 2. Interval placement of infrarenal IVC filter. Filling defect within the right great saphenous vein immediately distal to the saphenofemoral femoral junction, which appears new compared to 06/05/2024, suspicious for thrombus. Filling defect within the left common femoral vein, consistent with known thrombus. 3. Unchanged partially imaged bilateral lower lobe pulmonary emboli. 4. Increased size of the heterogeneously enhancing mass within the right hemiabdomen centered at the proximal duodenum with findings suspicious for interval invasion of the proximal transverse colon resulting in marked luminal narrowing. Along the periphery of this mass, there are areas of irregular arterial hyperenhancement, which expands on the venous phase image, favored to represent areas of progressive tumoral enhancement, although areas of active extravasation could have a similar appearance. 5. Bilobar hepatic metastases, many of which have increased in size. Increased size of right lower quadrant nodule and right pelvic sidewall nodule. 6. Unchanged right cardiophrenic and adrenal nodules and left para-aortic lymph node. 7. Unchanged linear 7 mm calculus at the left ureterovesical junction. No hydronephrosis. 8.  Aortic Atherosclerosis (ICD10-I70.0). Electronically Signed   By: Limin  Xu M.D.   On: 06/10/2024 15:48   DG Chest Portable 1 View Result Date: 06/10/2024 CLINICAL DATA:  Shortness of breath EXAM: PORTABLE CHEST 1 VIEW COMPARISON:  June 04, 2024 FINDINGS: The heart size and mediastinal contours are within normal limits. Right internal jugular Port-A-Cath is unchanged. Both lungs are clear. The visualized skeletal structures are unremarkable. IMPRESSION: No active disease. Electronically Signed   By: Lynwood Landy Raddle M.D.   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 EXAM: Title; COIL EMBOLIZATION OF GASTRODUODENAL ARTERY Listed procedures; 1.  ULTRASOUND-GUIDED RIGHT COMMON FEMORAL ARTERY ACCESS 2. MESENTERIC ARTERIOGRAPHY, including CELIAC, SUPERIOR MESENTERIC ARTERY and GASTRODUODENAL ARTERIOGRAMS 3. COIL EMBOLIZATION of the GASTRODUODENAL ARTERY 4. GELFOAM EMBOLIZATION of SUPERIOR MESENTERIC ARTERY BRANCHES COMPARISON:  CT AP, 06/05/2024. MEDICATIONS: 2 g cefoxitin  IV. The antibiotics were administered within 60 minutes of procedural initiation. 4 mg Zofran  IV.  50 mg albumin  IV ANESTHESIA/SEDATION: Moderate (conscious) sedation was employed during this procedure. A total of Versed  5 mg and Fentanyl  250 mcg was administered intravenously. Moderate Sedation Time: 90 minutes. The patient's level of consciousness and vital signs were monitored continuously by radiology nursing throughout the procedure under my direct supervision. CONTRAST:  140 mL Omnipaque  300 FLUOROSCOPY: Radiation Exposure Index and estimated peak skin dose (PSD); Reference air kerma (RAK), 429.3 mGy. COMPLICATIONS: None immediate. PROCEDURE: Informed consent was obtained from the patient and/or patient's representative following explanation of the procedure, risks, benefits and alternatives. All questions were addressed. A time out was performed prior to the initiation of the procedure. Maximal barrier sterile technique utilized including caps, mask, sterile gowns, sterile gloves, large sterile drape,  hand hygiene, and chlorhexidine  prep. The RIGHT femoral head was marked fluoroscopically. Under sterile conditions and local anesthesia, the RIGHT common femoral artery access was performed with a micropuncture needle. Under direct ultrasound guidance, the RIGHT common femoral was accessed with a micropuncture kit. An ultrasound image was saved for  documentation purposes. This allowed for placement of a 6 Fr 35 cm vascular sheath. A limited arteriogram was performed through the side arm of the sheath confirming appropriate access within the RIGHT common femoral artery. A limited abdominal aortogram was performed to help identify the celiac artery origin. Over a Bentson wire, a C2 catheter was advanced, back bled and flushed. The catheter was then utilized to select the celiac access, then advanced into the common hepatic then gastroduodenal arteries. Selective mesenteric arteriograms were performed at each level. Using a 2.8 Fr Progreat microcatheter and 0.016 inch Fathom microwire access into the gastroduodenal artery was performed and a selective arteriogram was performed. Selective embolization with multiple 0.018 inch micro coils was performed. The microcatheter was removed and arteriogram with the 5 Fr catheter at the common hepatic artery was performed. Adequate pruning of the GDA was achieved on post embolization arteriogram. The C2 catheter was replaced for a Sos catheter, which was then used to select the superior mesenteric artery. Additional selective arteriogram was performed and access into the branches of the SMA, including the inferior pancreaticoduodenal and middle colic artery was obtained with the microcatheter. Gel-Foam embolization was then performed to near-stasis. Post embolization arteriogram demonstrated adequate pruning of the SMA branches supplying the tumor. Images were reviewed and the procedure was terminated. All wires, catheters and sheaths were removed from the patient. Hemostasis was achieved at the RIGHT groin access site with Angio-Seal closure. The patient tolerated the procedure well without immediate post procedural complication. FINDINGS: *Celiac and superior mesenteric arteriograms with normal order and branching. *Multivessel arterial supply to the large duodenal tumor, from the GDA and branches off the SMA, including  the inferior pancreaticoduodenal and middle colic arteries. Tumor blush was identified, however no active extravasation was noted. *Coil embolization of the GDA, with additional Gelfoam embolization of the aforementioned SMA branches, with adequate pruning of the vasculature. IMPRESSION: 1. Mesenteric arteriography for duodenal mass, with arterial supply from the celiac axis at the GDA, and the SMA at the inferior pancreaticoduodenal and middle colic arteries. Tumor blush without evidence of active extravasation from the duodenal mass. 2. Successful coil embolization of the gastroduodenal artery, and Gelfoam embolization of the SMA branches without significant residual tumoral enhancement. Thom Hall, MD [facility] Radiology Electronically Signed   By: Thom Hall M.D.   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 EXAM: Title; COIL EMBOLIZATION OF GASTRODUODENAL ARTERY Listed procedures; 1.  ULTRASOUND-GUIDED RIGHT COMMON FEMORAL ARTERY ACCESS 2. MESENTERIC ARTERIOGRAPHY, including CELIAC, SUPERIOR MESENTERIC ARTERY and GASTRODUODENAL ARTERIOGRAMS 3. COIL EMBOLIZATION of the GASTRODUODENAL ARTERY 4. GELFOAM EMBOLIZATION of SUPERIOR MESENTERIC ARTERY BRANCHES COMPARISON:  CT AP, 06/05/2024. MEDICATIONS: 2 g cefoxitin  IV. The antibiotics were administered within 60 minutes of procedural initiation. 4 mg Zofran  IV.  50 mg albumin  IV ANESTHESIA/SEDATION: Moderate (conscious) sedation was employed during this procedure. A total of Versed  5 mg and Fentanyl  250 mcg was administered intravenously. Moderate Sedation Time: 90 minutes. The patient's level of consciousness and vital signs were monitored continuously by radiology nursing throughout the  procedure under my direct supervision. CONTRAST:   140 mL Omnipaque  300 FLUOROSCOPY: Radiation Exposure Index and estimated peak skin dose (PSD); Reference air kerma (RAK), 429.3 mGy. COMPLICATIONS: None immediate. PROCEDURE: Informed consent was obtained from the patient and/or patient's representative following explanation of the procedure, risks, benefits and alternatives. All questions were addressed. A time out was performed prior to the initiation of the procedure. Maximal barrier sterile technique utilized including caps, mask, sterile gowns, sterile gloves, large sterile drape, hand hygiene, and chlorhexidine  prep. The RIGHT femoral head was marked fluoroscopically. Under sterile conditions and local anesthesia, the RIGHT common femoral artery access was performed with a micropuncture needle. Under direct ultrasound guidance, the RIGHT common femoral was accessed with a micropuncture kit. An ultrasound image was saved for documentation purposes. This allowed for placement of a 6 Fr 35 cm vascular sheath. A limited arteriogram was performed through the side arm of the sheath confirming appropriate access within the RIGHT common femoral artery. A limited abdominal aortogram was performed to help identify the celiac artery origin. Over a Bentson wire, a C2 catheter was advanced, back bled and flushed. The catheter was then utilized to select the celiac access, then advanced into the common hepatic then gastroduodenal arteries. Selective mesenteric arteriograms were performed at each level. Using a 2.8 Fr Progreat microcatheter and 0.016 inch Fathom microwire access into the gastroduodenal artery was performed and a selective arteriogram was performed. Selective embolization with multiple 0.018 inch micro coils was performed. The microcatheter was removed and arteriogram with the 5 Fr catheter at the common hepatic artery was performed. Adequate pruning of the GDA was achieved on post embolization arteriogram. The C2 catheter was replaced for a Sos catheter,  which was then used to select the superior mesenteric artery. Additional selective arteriogram was performed and access into the branches of the SMA, including the inferior pancreaticoduodenal and middle colic artery was obtained with the microcatheter. Gel-Foam embolization was then performed to near-stasis. Post embolization arteriogram demonstrated adequate pruning of the SMA branches supplying the tumor. Images were reviewed and the procedure was terminated. All wires, catheters and sheaths were removed from the patient. Hemostasis was achieved at the RIGHT groin access site with Angio-Seal closure. The patient tolerated the procedure well without immediate post procedural complication. FINDINGS: *Celiac and superior mesenteric arteriograms with normal order and branching. *Multivessel arterial supply to the large duodenal tumor, from the GDA and branches off the SMA, including the inferior pancreaticoduodenal and middle colic arteries. Tumor blush was identified, however no active extravasation was noted. *Coil embolization of the GDA, with additional Gelfoam embolization of the aforementioned SMA branches, with adequate pruning of the vasculature. IMPRESSION: 1. Mesenteric arteriography for duodenal mass, with arterial supply from the celiac axis at the GDA, and the SMA at the inferior pancreaticoduodenal and middle colic arteries. Tumor blush without evidence of active extravasation from the duodenal mass. 2. Successful coil embolization of the gastroduodenal artery, and Gelfoam embolization of the SMA branches without significant residual tumoral enhancement. Thom Hall, MD [facility] Radiology Electronically Signed   By: Thom Hall M.D.   On: 06/09/2024 14:52   IR Angiogram Selective Each Additional Vessel Result Date: 06/09/2024 INDICATION: duodenal mass - bleeding Briefly, 69 year old female with a history of metastatic ovarian cancer and bleeding  duodenal metastasis. EXAM: Title; COIL EMBOLIZATION OF GASTRODUODENAL ARTERY Listed procedures; 1.  ULTRASOUND-GUIDED RIGHT COMMON FEMORAL ARTERY ACCESS 2. MESENTERIC ARTERIOGRAPHY, including CELIAC, SUPERIOR MESENTERIC ARTERY and GASTRODUODENAL ARTERIOGRAMS 3. COIL EMBOLIZATION  of the GASTRODUODENAL ARTERY 4. GELFOAM EMBOLIZATION of SUPERIOR MESENTERIC ARTERY BRANCHES COMPARISON:  CT AP, 06/05/2024. MEDICATIONS: 2 g cefoxitin  IV. The antibiotics were administered within 60 minutes of procedural initiation. 4 mg Zofran  IV.  50 mg albumin  IV ANESTHESIA/SEDATION: Moderate (conscious) sedation was employed during this procedure. A total of Versed  5 mg and Fentanyl  250 mcg was administered intravenously. Moderate Sedation Time: 90 minutes. The patient's level of consciousness and vital signs were monitored continuously by radiology nursing throughout the procedure under my direct supervision. CONTRAST:  140 mL Omnipaque  300 FLUOROSCOPY: Radiation Exposure Index and estimated peak skin dose (PSD); Reference air kerma (RAK), 429.3 mGy. COMPLICATIONS: None immediate. PROCEDURE: Informed consent was obtained from the patient and/or patient's representative following explanation of the procedure, risks, benefits and alternatives. All questions were addressed. A time out was performed prior to the initiation of the procedure. Maximal barrier sterile technique utilized including caps, mask, sterile gowns, sterile gloves, large sterile drape, hand hygiene, and chlorhexidine  prep. The RIGHT femoral head was marked fluoroscopically. Under sterile conditions and local anesthesia, the RIGHT common femoral artery access was performed with a micropuncture needle. Under direct ultrasound guidance, the RIGHT common femoral was accessed with a micropuncture kit. An ultrasound image was saved for documentation purposes. This allowed for placement of a 6 Fr 35 cm vascular sheath. A limited arteriogram was performed through the side arm of  the sheath confirming appropriate access within the RIGHT common femoral artery. A limited abdominal aortogram was performed to help identify the celiac artery origin. Over a Bentson wire, a C2 catheter was advanced, back bled and flushed. The catheter was then utilized to select the celiac access, then advanced into the common hepatic then gastroduodenal arteries. Selective mesenteric arteriograms were performed at each level. Using a 2.8 Fr Progreat microcatheter and 0.016 inch Fathom microwire access into the gastroduodenal artery was performed and a selective arteriogram was performed. Selective embolization with multiple 0.018 inch micro coils was performed. The microcatheter was removed and arteriogram with the 5 Fr catheter at the common hepatic artery was performed. Adequate pruning of the GDA was achieved on post embolization arteriogram. The C2 catheter was replaced for a Sos catheter, which was then used to select the superior mesenteric artery. Additional selective arteriogram was performed and access into the branches of the SMA, including the inferior pancreaticoduodenal and middle colic artery was obtained with the microcatheter. Gel-Foam embolization was then performed to near-stasis. Post embolization arteriogram demonstrated adequate pruning of the SMA branches supplying the tumor. Images were reviewed and the procedure was terminated. All wires, catheters and sheaths were removed from the patient. Hemostasis was achieved at the RIGHT groin access site with Angio-Seal closure. The patient tolerated the procedure well without immediate post procedural complication. FINDINGS: *Celiac and superior mesenteric arteriograms with normal order and branching. *Multivessel arterial supply to the large duodenal tumor, from the GDA and branches off the SMA, including the inferior pancreaticoduodenal and middle colic arteries. Tumor blush was identified, however no active extravasation was noted. *Coil  embolization of the GDA, with additional Gelfoam embolization of the aforementioned SMA branches, with adequate pruning of the vasculature. IMPRESSION: 1. Mesenteric arteriography for duodenal mass, with arterial supply from the celiac axis at the GDA, and the SMA at the inferior pancreaticoduodenal and middle colic arteries. Tumor blush without evidence of active extravasation from the duodenal mass. 2. Successful coil embolization of the gastroduodenal artery, and Gelfoam embolization of the SMA branches without significant residual tumoral enhancement. Thom Hall,  MD [facility] Radiology Electronically Signed   By: Thom Hall M.D.   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 EXAM: Title; COIL EMBOLIZATION OF GASTRODUODENAL ARTERY Listed procedures; 1.  ULTRASOUND-GUIDED RIGHT COMMON FEMORAL ARTERY ACCESS 2. MESENTERIC ARTERIOGRAPHY, including CELIAC, SUPERIOR MESENTERIC ARTERY and GASTRODUODENAL ARTERIOGRAMS 3. COIL EMBOLIZATION of the GASTRODUODENAL ARTERY 4. GELFOAM EMBOLIZATION of SUPERIOR MESENTERIC ARTERY BRANCHES COMPARISON:  CT AP, 06/05/2024. MEDICATIONS: 2 g cefoxitin  IV. The antibiotics were administered within 60 minutes of procedural initiation. 4 mg Zofran  IV.  50 mg albumin  IV ANESTHESIA/SEDATION: Moderate (conscious) sedation was employed during this procedure. A total of Versed  5 mg and Fentanyl  250 mcg was administered intravenously. Moderate Sedation Time: 90 minutes. The patient's level of consciousness and vital signs were monitored continuously by radiology nursing throughout the procedure under my direct supervision. CONTRAST:  140 mL Omnipaque  300 FLUOROSCOPY: Radiation Exposure Index and estimated peak skin dose (PSD); Reference air kerma (RAK),  429.3 mGy. COMPLICATIONS: None immediate. PROCEDURE: Informed consent was obtained from the patient and/or patient's representative following explanation of the procedure, risks, benefits and alternatives. All questions were addressed. A time out was performed prior to the initiation of the procedure. Maximal barrier sterile technique utilized including caps, mask, sterile gowns, sterile gloves, large sterile drape, hand hygiene, and chlorhexidine  prep. The RIGHT femoral head was marked fluoroscopically. Under sterile conditions and local anesthesia, the RIGHT common femoral artery access was performed with a micropuncture needle. Under direct ultrasound guidance, the RIGHT common femoral was accessed with a micropuncture kit. An ultrasound image was saved for documentation purposes. This allowed for placement of a 6 Fr 35 cm vascular sheath. A limited arteriogram was performed through the side arm of the sheath confirming appropriate access within the RIGHT common femoral artery. A limited abdominal aortogram was performed to help identify the celiac artery origin. Over a Bentson wire, a C2 catheter was advanced, back bled and flushed. The catheter was then utilized to select the celiac access, then advanced into the common hepatic then gastroduodenal arteries. Selective mesenteric arteriograms were performed at each level. Using a 2.8 Fr Progreat microcatheter and 0.016 inch Fathom microwire access into the gastroduodenal artery was performed and a selective arteriogram was performed. Selective embolization with multiple 0.018 inch micro coils was performed. The microcatheter was removed and arteriogram with the 5 Fr catheter at the common hepatic artery was performed. Adequate pruning of the GDA was achieved on post embolization arteriogram. The C2 catheter was replaced for a Sos catheter, which was then used to select the superior mesenteric artery. Additional selective arteriogram was performed and access into  the branches of the SMA, including the inferior pancreaticoduodenal and middle colic artery was obtained with the microcatheter. Gel-Foam embolization was then performed to near-stasis. Post embolization arteriogram demonstrated adequate pruning of the SMA branches supplying the tumor. Images were reviewed and the procedure was terminated. All wires, catheters and sheaths were removed from the patient. Hemostasis was achieved at the RIGHT groin access site with Angio-Seal closure. The patient tolerated the procedure well without immediate post procedural complication. FINDINGS: *Celiac and superior mesenteric arteriograms with normal order and branching. *Multivessel arterial supply to the large duodenal tumor, from the GDA and branches off the SMA, including the inferior pancreaticoduodenal and middle colic arteries. Tumor blush was identified, however no active extravasation  was noted. *Coil embolization of the GDA, with additional Gelfoam embolization of the aforementioned SMA branches, with adequate pruning of the vasculature. IMPRESSION: 1. Mesenteric arteriography for duodenal mass, with arterial supply from the celiac axis at the GDA, and the SMA at the inferior pancreaticoduodenal and middle colic arteries. Tumor blush without evidence of active extravasation from the duodenal mass. 2. Successful coil embolization of the gastroduodenal artery, and Gelfoam embolization of the SMA branches without significant residual tumoral enhancement. Thom Hall, MD [facility] Radiology Electronically Signed   By: Thom Hall M.D.   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 EXAM: Title; COIL EMBOLIZATION OF GASTRODUODENAL ARTERY Listed procedures; 1.   ULTRASOUND-GUIDED RIGHT COMMON FEMORAL ARTERY ACCESS 2. MESENTERIC ARTERIOGRAPHY, including CELIAC, SUPERIOR MESENTERIC ARTERY and GASTRODUODENAL ARTERIOGRAMS 3. COIL EMBOLIZATION of the GASTRODUODENAL ARTERY 4. GELFOAM EMBOLIZATION of SUPERIOR MESENTERIC ARTERY BRANCHES COMPARISON:  CT AP, 06/05/2024. MEDICATIONS: 2 g cefoxitin  IV. The antibiotics were administered within 60 minutes of procedural initiation. 4 mg Zofran  IV.  50 mg albumin  IV ANESTHESIA/SEDATION: Moderate (conscious) sedation was employed during this procedure. A total of Versed  5 mg and Fentanyl  250 mcg was administered intravenously. Moderate Sedation Time: 90 minutes. The patient's level of consciousness and vital signs were monitored continuously by radiology nursing throughout the procedure under my direct supervision. CONTRAST:  140 mL Omnipaque  300 FLUOROSCOPY: Radiation Exposure Index and estimated peak skin dose (PSD); Reference air kerma (RAK), 429.3 mGy. COMPLICATIONS: None immediate. PROCEDURE: Informed consent was obtained from the patient and/or patient's representative following explanation of the procedure, risks, benefits and alternatives. All questions were addressed. A time out was performed prior to the initiation of the procedure. Maximal barrier sterile technique utilized including caps, mask, sterile gowns, sterile gloves, large sterile drape, hand hygiene, and chlorhexidine  prep. The RIGHT femoral head was marked fluoroscopically. Under sterile conditions and local anesthesia, the RIGHT common femoral artery access was performed with a micropuncture needle. Under direct ultrasound guidance, the RIGHT common femoral was accessed with a micropuncture kit. An ultrasound image was saved for documentation purposes. This allowed for placement of a 6 Fr 35 cm vascular sheath. A limited arteriogram was performed through the side arm of the sheath confirming appropriate access within the RIGHT common femoral artery. A limited  abdominal aortogram was performed to help identify the celiac artery origin. Over a Bentson wire, a C2 catheter was advanced, back bled and flushed. The catheter was then utilized to select the celiac access, then advanced into the common hepatic then gastroduodenal arteries. Selective mesenteric arteriograms were performed at each level. Using a 2.8 Fr Progreat microcatheter and 0.016 inch Fathom microwire access into the gastroduodenal artery was performed and a selective arteriogram was performed. Selective embolization with multiple 0.018 inch micro coils was performed. The microcatheter was removed and arteriogram with the 5 Fr catheter at the common hepatic artery was performed. Adequate pruning of the GDA was achieved on post embolization arteriogram. The C2 catheter was replaced for a Sos catheter, which was then used to select the superior mesenteric artery. Additional selective arteriogram was performed and access into the branches of the SMA, including the inferior pancreaticoduodenal and middle colic artery was obtained with the microcatheter. Gel-Foam embolization was then performed to near-stasis. Post embolization arteriogram demonstrated adequate pruning of the SMA branches supplying  the tumor. Images were reviewed and the procedure was terminated. All wires, catheters and sheaths were removed from the patient. Hemostasis was achieved at the RIGHT groin access site with Angio-Seal closure. The patient tolerated the procedure well without immediate post procedural complication. FINDINGS: *Celiac and superior mesenteric arteriograms with normal order and branching. *Multivessel arterial supply to the large duodenal tumor, from the GDA and branches off the SMA, including the inferior pancreaticoduodenal and middle colic arteries. Tumor blush was identified, however no active extravasation was noted. *Coil embolization of the GDA, with additional Gelfoam embolization of the aforementioned SMA branches,  with adequate pruning of the vasculature. IMPRESSION: 1. Mesenteric arteriography for duodenal mass, with arterial supply from the celiac axis at the GDA, and the SMA at the inferior pancreaticoduodenal and middle colic arteries. Tumor blush without evidence of active extravasation from the duodenal mass. 2. Successful coil embolization of the gastroduodenal artery, and Gelfoam embolization of the SMA branches without significant residual tumoral enhancement. Thom Hall, MD [facility] Radiology Electronically Signed   By: Thom Hall M.D.   On: 06/09/2024 14:52   VAS US  LOWER EXTREMITY VENOUS (DVT) Result Date: 06/09/2024  Lower Venous DVT Study Patient Name:  MARALYN WITHERELL  Date of Exam:   06/06/2024 Medical Rec #: [id number]        Accession #:    [id number] Date of Birth: 06/02/1955       Patient Gender: F Patient Age:   58 years Exam Location:  [facility] Procedure:      VAS US  LOWER EXTREMITY VENOUS (DVT) Referring Phys: THOM HALL --------------------------------------------------------------------------------  Indications: Incidental pulmonary embolism on abdominal CTA 06/05/24 Other Indications: Status post mesenteric arteriography and duodenal mass                    embolization with IVC filter placement 06/06/24. 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 Limitations: Bandage right groin. Comparison Study: Prior negative left LEV done 05/29/22 Performing Technologist: Alberta Lis RVS  Examination Guidelines: A complete evaluation includes B-mode imaging, spectral Doppler, color Doppler, and power Doppler as needed of all accessible portions of each vessel. Bilateral testing is considered an integral part of a complete examination. Limited examinations for reoccurring indications may be performed as noted. The reflux portion of the exam is performed with the patient in reverse Trendelenburg.   +---------+---------------+---------+-----------+----------+--------------+ RIGHT    CompressibilityPhasicitySpontaneityPropertiesThrombus Aging +---------+---------------+---------+-----------+----------+--------------+ CFV      Full           Yes      No                                  +---------+---------------+---------+-----------+----------+--------------+ SFJ      Full                                                        +---------+---------------+---------+-----------+----------+--------------+ FV Prox  Full           Yes      No                                  +---------+---------------+---------+-----------+----------+--------------+ FV Mid  Full                                                        +---------+---------------+---------+-----------+----------+--------------+ FV DistalFull                                                        +---------+---------------+---------+-----------+----------+--------------+ PFV      Full           Yes      No                                  +---------+---------------+---------+-----------+----------+--------------+ POP      Full           Yes      No                                  +---------+---------------+---------+-----------+----------+--------------+ PTV      Full                                                        +---------+---------------+---------+-----------+----------+--------------+ PERO     Full                                                        +---------+---------------+---------+-----------+----------+--------------+   +---------+---------------+---------+-----------+----------+--------------+ LEFT     CompressibilityPhasicitySpontaneityPropertiesThrombus Aging +---------+---------------+---------+-----------+----------+--------------+ CFV      Full           Yes      No                                   +---------+---------------+---------+-----------+----------+--------------+ SFJ      Full                                                        +---------+---------------+---------+-----------+----------+--------------+ FV Prox  None           No       No                   Acute          +---------+---------------+---------+-----------+----------+--------------+ FV Mid   None           No       No                   Acute          +---------+---------------+---------+-----------+----------+--------------+ FV DistalNone  No       No                   Acute          +---------+---------------+---------+-----------+----------+--------------+ PFV      Full           Yes      No                                  +---------+---------------+---------+-----------+----------+--------------+ POP      None           No       No                   Acute          +---------+---------------+---------+-----------+----------+--------------+ PTV      None                                         Acute          +---------+---------------+---------+-----------+----------+--------------+ PERO     None                                         Acute          +---------+---------------+---------+-----------+----------+--------------+     Summary: RIGHT: - There is no evidence of deep vein thrombosis in the lower extremity.  - No cystic structure found in the popliteal fossa.  LEFT: - Findings consistent with acute deep vein thrombosis involving the left femoral vein, left popliteal vein, left posterior tibial veins, and left peroneal veins.  - No cystic structure found in the popliteal fossa.  *See table(s) above for measurements and observations. Electronically signed by Gaile New MD on 06/09/2024 at 10:45:02 AM.    Final    IR IVC FILTER PLMT / S&I PORTER GUID/MOD SED Result Date: 06/07/2024 INDICATION: duodenal mass Briefly, 69 year old female with a history of metastatic  ovarian cancer, bleeding duodenal metastasis, and new, incidental PE and LEFT lower extremity DVT. Unable to anticoagulate. EXAM: Title; INFERIOR VENA CAVA (IVC) FILTER PLACEMENT Listed procedures; 1. ULTRASOUND-GUIDED RIGHT GREATER SAPHENOUS VEIN ACCESS 2. CENTRAL VENOGRAM 3. INFERIOR VENA CAVA FILTER PLACEMENT MEDICATIONS: None. ANESTHESIA/SEDATION: Moderate (conscious) sedation was employed during this procedure. A total of Versed  1 mg and Fentanyl  50 mcg was administered intravenously. Moderate Sedation Time: 18 minutes. The patient's level of consciousness and vital signs were monitored continuously by radiology nursing throughout the procedure under my direct supervision. CONTRAST:  60 mL Omnipaque  300 FLUOROSCOPY: Radiation Exposure Index and estimated peak skin dose (PSD); Reference air kerma (RAK), 111.9 mGy. COMPLICATIONS: None immediate. PROCEDURE: Informed written consent was obtained from the patient and/or patient's representative following explanation of the procedure, risks, benefits and alternatives. A time out was performed prior to the initiation of the procedure. Maximal barrier sterile technique utilized including caps, mask, sterile gowns, sterile gloves, large sterile drape, hand hygiene, and sterile prep. Under sterile condition and local anesthesia, RIGHT greater saphenous vein access was performed with ultrasound. An ultrasound image was saved and sent to PACS. Over a guidewire, the IVC filter delivery sheath and inner dilator were advanced into the IVC just above the IVC bifurcation. Contrast injection was performed for  an IVC venogram. Through the delivery sheath, a retrievable Denali IVC filter was deployed below the level of the renal veins and above the IVC bifurcation. Limited post deployment venacavagram was performed. The delivery sheath was removed and hemostasis was obtained with manual compression. A dressing was placed. The patient tolerated the procedure well without immediate  post procedural complication. FINDINGS: The IVC is patent. No evidence of thrombus, stenosis, or occlusion. No variant venous anatomy. IMPRESSION: Successful placement of a retrievable infrarenal inferior vena cava (IVC) filter PLAN: IVC filters can cause complications when left in place for extended periods of time. If medically appropriate, recommend discontinuing filter prior to discharge. Please re-evaluate the patient for filter discontinuation when they are seen in follow up, and refer patient to Interventional Radiology for removal. Thom Hall, MD [facility] [address] Radiology Electronically Signed   By: Thom Hall M.D.   On: 06/07/2024 07:44   CT Angio Chest Pulmonary Embolism (PE) W or WO Contrast Result Date: 06/06/2024 CLINICAL DATA:  Acute pulmonary embolism, unspecified pulmonary embolism type, unspecified whether acute cor pulmonale present. Pulmonary embolism seen on CTA from 06/05/2024. EXAM: CT ANGIOGRAPHY CHEST WITH CONTRAST TECHNIQUE: Multidetector CT imaging of the chest was performed using the standard protocol during bolus administration of intravenous contrast. Multiplanar CT image reconstructions and MIPs were obtained to evaluate the vascular anatomy. RADIATION DOSE REDUCTION: This exam was performed according to the departmental dose-optimization program which includes automated exposure control, adjustment of the mA and/or kV according to patient size and/or use of iterative reconstruction technique. CONTRAST:  75mL OMNIPAQUE  IOHEXOL  350 MG/ML SOLN COMPARISON:  CTA abdomen pelvis 06/05/2024 FINDINGS: Cardiovascular: Positive for bilateral pulmonary emboli. Moderate clot burden with predominantly nonocclusive clot bilaterally. There is a small amount of clot in the distal right pulmonary artery extending into the truncus anterior and right interlobar artery. Clot extending into right segmental pulmonary arteries. Clot in the left lobar and  segmental pulmonary arteries. No evidence for right heart strain. Normal caliber of the thoracic aorta. Arch great vessels are patent. Heart size is normal. No significant pericardial effusion. Embolization coils involving the gastroduodenal artery. Mediastinum/Nodes: Again noted is a soft tissue mass in the right anterior cardiophrenic fat measuring up to 3.4 cm. No significant mediastinal or hilar lymph node enlargement. No axillary lymph enlargement. Lungs/Pleura: Trachea and mainstem bronchi are patent. No pleural effusions. Small parenchymal densities or airspace densities in the medial right upper lobe on image 51, sequence 7 are new since 2023. Findings could be associated with infarct versus small area of airspace disease. 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 These findings are poorly characterized on this chest CT. Right adrenal nodule. Musculoskeletal: No acute bone abnormality. Review of the MIP images confirms the above findings. IMPRESSION: 1. Positive for bilateral pulmonary emboli with moderate clot burden. Pulmonary emboli are predominantly nonocclusive and findings could be subacute. No evidence for right heart strain. 2. Parenchymal densities in the medial right lung could represent a small infarct versus small area of infection/inflammation. 3. Neoplastic disease in the upper abdomen. These results were called by telephone at the time of interpretation on 06/06/2024 at 2:04 pm to provider Dr. Hall, Who verbally acknowledged these results. Electronically Signed   By: Juliene Balder M.D.   On: 06/06/2024 14:09   CT Angio Abd/Pel w/ and/or w/o Result Date: 06/06/2024 EXAM: CTA ABDOMEN AND PELVIS WITHOUT AND WITH CONTRAST 06/05/2024 02:49:17 PM TECHNIQUE: CTA images  of the abdomen and pelvis without and with intravenous contrast. 125 mL (iopamidol  (ISOVUE -370) 76 % injection 125 mL IOPAMIDOL  (ISOVUE -370) INJECTION 76%) was  administered. Three-dimensional MIP/volume rendered formations were performed. Automated exposure control, iterative reconstruction, and/or weight based adjustment of the mA/kV was utilized to reduce the radiation dose to as low as reasonably achievable. COMPARISON: Stable compared with the previous exam where applicable. New from prior exam where applicable. CLINICAL HISTORY: FINDINGS: VASCULATURE: AORTA: Aortic atherosclerotic calcification. No acute finding. No abdominal aortic aneurysm. No dissection. CELIAC TRUNK: No acute finding. No occlusion or significant stenosis. SUPERIOR MESENTERIC ARTERY: No acute finding. No occlusion or significant stenosis. RENAL ARTERIES: No acute finding. No occlusion or significant stenosis. ILIAC ARTERIES: No acute finding. No occlusion or significant stenosis. LIVER: Bilobar liver metastases. Index lesion within segment 7 measures 2.9 x 2.4 cm, axial image 24/7. Index lesion within segment 4a measures 8.7 x 6.6 cm, image 16/7. The index lesion within segment 2 measures 5.4 x 4.2 cm, axial image 16/7. GALLBLADDER AND BILE DUCTS: Circumferential gallbladder wall thickening of the gallbladder measures 4 mm. No biliary ductal dilatation. SPLEEN: The spleen is within normal limits in size and appearance. PANCREAS: The pancreas is normal in size and contour without focal lesion or ductal dilatation. ADRENAL GLANDS: There is a right adrenal nodule measuring 2.1 x 1.8 cm and 9 Hounsfield units, which is stable compared with the previous exam. KIDNEYS, URETERS AND BLADDER: Persistent stone within the urinary bladder near the expected location of the left UVJ measuring 7 mm in length, image 75/7. There is no hydronephrosis identified. No stones in the kidneys or ureters. No perinephric or periureteral stranding. 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 This measures 10.7 x  8.9 x 7.3 cm. On the previous exam this measured 7.2 x 7.5 x 6.9 cm. Mass effect on the patent extrahepatic portal vein is again noted. On today's exam the mass extends up and abuts the surface of the gallbladder with loss of a fat plane between these two structures. The mass also extends up to the serosal surface of the transverse colon with loss of a fat plane between these two structures as well. There is no pathologic dilatation of the bowel loops to indicate a mechanical obstruction. There is a moderate to large stool burden identified within the colon. No signs of active disease. No signs of intraluminal contrast extravasation to suggest active GI bleeding. REPRODUCTIVE: The uterus appears surgically absent. PERITONEUM AND RETRPERITONEUM: Signs of peritoneal metastases. Index a lesion within the right lower quadrant measures 3.1 cm (image 57/7). New from prior exam. Along the right parapelvic sidewall, there is a lesion measuring 1.2 cm, image 64/7. Midline periumbilical hernia contains fat only. No ascites or free air. LUNG BASE: Filling defect within a segmental branch of the right lower lobe pulmonary artery, compatible with incidental pulmonary embolus, axial image 2/5. LYMPH NODES: Abdominal adenopathy is identified. The index left retroperitoneal node measures 1.1 cm, image 39/7. On the previous exam, this measured 7 mm. BONES AND SOFT TISSUES: Postoperative changes noted within the lumbar spine. No acute or suspicious osseous findings. No acute soft tissue abnormality. IMPRESSION: 1. No evidence of active gastrointestinal bleeding on this CTA abdomen and pelvis. 2. Incidental pulmonary embolism identified within a segmental branch of the right lower lobe pulmonary artery. 3. Interval increase in size of the known duodenal malignancy, now measuring 10.7  x 8.9 x 7.3 cm, with suspected contiguous involvement of the gallbladder and transverse colon. 4. Bilobar hepatic metastatic disease.  New from prior. 5.  Progressive abdominal nodal metastatic disease. 6. Peritoneal metastatic disease, new from prior. Electronically signed by: Waddell Calk MD 06/06/2024 07:36 AM EST RP Workstation: [id number]   DG Chest Portable 1 View Result Date: 06/04/2024 EXAM: 1 VIEW(S) XRAY OF THE CHEST 06/04/2024 04:19:00 PM COMPARISON: 05/09/2024 CLINICAL HISTORY: Fever. FINDINGS: LINES, TUBES AND DEVICES: Right chest power injectable port in place with tip at the lower SVC. LUNGS AND PLEURA: Panlike density favoring atelectasis along the left hemidiaphragm. No pleural effusion. No pneumothorax. HEART AND MEDIASTINUM: No acute abnormality of the cardiac and mediastinal silhouettes. BONES AND SOFT TISSUES: No acute osseous abnormality. JOINTS: Mild degenerative glenohumeral arthropathy bilaterally. IMPRESSION: 1. Mild left basilar atelectasis along the left hemidiaphragm. 2. Right chest power-injectable port with tip in the lower SVC. 3. Mild bilateral glenohumeral degenerative arthropathy. Electronically signed by: Ryan Salvage MD 06/04/2024 04:52 PM EST RP Workstation: [id number]   IR Radiologist Eval & Mgmt Result Date: 05/27/2024 EXAM: NEW PATIENT OFFICE VISIT CHIEF COMPLAINT: See below HISTORY OF PRESENT ILLNESS: See below REVIEW OF SYSTEMS: See below PHYSICAL EXAMINATION: See below ASSESSMENT AND PLAN: Please refer to completed note in the electronic medical record on [place] [facility] Thom Hall, MD [facility] Radiology Electronically Signed   By: Thom Hall M.D.   On: 05/27/2024 09:02    PERFORMANCE STATUS (ECOG) : 1 - Symptomatic but completely ambulatory  Review of Systems Unless otherwise noted, a complete review of systems is negative.  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  She is admitted to the hospital with intractable abdominal pain.  CTA of the abdomen and pelvis reveals overall disease progression with increased size of right duodenal mass with interval invasion of the proximal transverse colon resulting in luminal narrowing, increased size of hepatic metastases and right lower quadrant/right pelvic sidewall nodule.  I met with patient.  Her sister participated in the conversation via phone.  Patient says she recognizes that her cancer is progressing and will likely eventually be the cause of her end-of-life.  She says she has a strong belief that God will take her when he is ready.  Additional chemotherapy had been discussed as a possible option but patient tells me today that she is not interested in that.  She says that she would prioritize comfort and quality of life.  She would be interested in continued transfusions and iron  infusions as she feels that these contribute to her quality of life.  As such, patient says she is not ready for hospice involvement.  However, she would be interested in home health and/or community palliative care.  We discussed CODE STATUS.  Patient stated clearly and repeatedly that she would not want to be resuscitated or have her life prolonged artificially on machines.  Both patient and sister verbalized agreement to DNR/DNI.  Symptomatically, patient has had poorly controlled upper abdominal pain.  At home, patient was taking Xtampza  ER 13.5 mg every 8 hours and oxycodone  IR 20 mg every 4 hours around-the-clock.  Patient states that pain is  worse at night.  Of note, patient has had her neurology practice managing her pain chronically.  We have offered to take over pain management at our cancer center but  patient has been reluctant to switch.  Patient tells me today that she would prefer to leave pain management with neurology.  I explained that adjustment of her pain regimen should only occur by 1 outpatient provider.  We did discuss possible options for adjusting her regimen while she is hospitalized including rotating from Xtampza  ER to transdermal fentanyl .  Patient is somewhat reluctant to make medication adjustments despite her pain.  Patient is interested in procedural interventions.  I spoke with IR (Dr. Karalee) regarding possible celiac plexus block and will refer for formal consult.  PLAN: -Continue current scope of treatment -Continue oxycodone  ER/oxycodone  IR - DNR/DNI - Referral to IR for consideration of celiac plexus block  Case and plan discussed with Dr. Babara   Time Total: 50 minutes  Visit consisted of counseling and education dealing with the complex and emotionally intense issues of symptom management and palliative care in the setting of serious and potentially life-threatening illness.Greater than 50%  of this time was spent counseling and coordinating care related to the above assessment and plan.  Signed by: Fonda Mower, PhD, NP-C   "

## 2024-06-11 NOTE — Plan of Care (Signed)
   Problem: Education: Goal: Knowledge of General Education information will improve Description Including pain rating scale, medication(s)/side effects and non-pharmacologic comfort measures Outcome: Progressing

## 2024-06-11 NOTE — Hospital Course (Signed)
 69 y.o. female with medical history significant for metastatic ovarian cancer with duodenal metastasis and recurrent GI bleeding, cancer related pain, and bilateral pulmonary embolism diagnosed 06/06/2024, acute superficial venous thrombosis of cephalic vein of left upper arm, multiple sclerosis, rheumatoid arthritis on hydroxychloroquine , GERD, hypertension, depression, endometriosis, COPD, ADHD, anxiety.  She recently underwent 1.  Mesenteric arteriography and duodenal mass embolization 2. central venogram and IVC filter placement by IR on 06/06/2024.  She said that since her procedure, she has been having worsening abdominal pain despite taking the opioid analgesics at home.  She came to the hospital because of worsening abdominal pain.    Assessment and Plan:   Intractable abdominal pain secondary to metastatic ovarian cancer - Noted metastasis to duodenum, liver, colon.  S/p recent mesenteric arteriography and duodenal mass embolization 1/30 by IR.  Evaluated by GI, no intervention recommended.  Currently on long and short acting oxycodone , IV Dilaudid  as needed.  Palliative care consult pending.   Acute on chronic blood loss anemia - Hemoglobin 6.5 on admission.  S/p 1 unit PRBCs.  Etiology secondary to chronic bleeding from metastatic invasion of GI tract.   Acute pulmonary embolism/acute left common femoral vein DVT/right great saphenous vein thrombosis - S/p IVC filter placement 1/30 by IR.  Anticoagulation contraindicated due to recurrent GI bleeding and ovarian cancer metastasis to duodenum.   Hypokalemia - Replenishment on board.  Will recheck BMP and magnesium in AM.   COPD - Nebulizers as needed.   Multiple sclerosis - Does not appear to be in acute exacerbation.   Hypoalbuminemia/peripheral edema - Encourage p.o. intake.  Protein calorie supplementation.   Goals of care - Patient continues to be full code.

## 2024-06-11 NOTE — Progress Notes (Signed)
 " Progress Note   Patient: [name] FMW:[id number] DOB: 1955-09-13 DOA: 06/10/2024  DOS: the patient was seen and examined on 06/11/2024   Brief hospital course:  69 y.o. female with medical history significant for metastatic ovarian cancer with duodenal metastasis and recurrent GI bleeding, cancer related pain, and bilateral pulmonary embolism diagnosed 06/06/2024, acute superficial venous thrombosis of cephalic vein of left upper arm, multiple sclerosis, rheumatoid arthritis on hydroxychloroquine , GERD, hypertension, depression, endometriosis, COPD, ADHD, anxiety.  She recently underwent 1.  Mesenteric arteriography and duodenal mass embolization 2. central venogram and IVC filter placement by IR on 06/06/2024.  She said that since her procedure, she has been having worsening abdominal pain despite taking the opioid analgesics at home.  She came to the hospital because of worsening abdominal pain.   Assessment and Plan:  Intractable abdominal pain secondary to metastatic ovarian cancer - Noted metastasis to duodenum, liver, colon.  S/p recent mesenteric arteriography and duodenal mass embolization 1/30 by IR.  Evaluated by GI, no intervention recommended.  Currently on long and short acting oxycodone , IV Dilaudid  as needed.  Palliative care consult pending.  Acute on chronic blood loss anemia - Hemoglobin 6.5 on admission.  S/p 1 unit PRBCs.  Etiology secondary to chronic bleeding from metastatic invasion of GI tract.  Acute pulmonary embolism/acute left common femoral vein DVT/right great saphenous vein thrombosis - S/p IVC filter placement 1/30 by IR.  Anticoagulation contraindicated due to recurrent GI bleeding and ovarian cancer metastasis to duodenum.  Hypokalemia - Replenishment on board.  Will recheck BMP and magnesium in AM.  COPD - Nebulizers as needed.  Multiple sclerosis - Does not appear to be in acute exacerbation.  Hypoalbuminemia/peripheral edema - Encourage p.o.  intake.  Protein calorie supplementation.  Goals of care - Patient continues to be full code.   Subjective: Patient sitting in bed, still with intractable abdominal pain.  Denies shortness of breath, fever, nausea, vomiting.  Physical Exam:  Vitals:   06/11/24 1000 06/11/24 1100 06/11/24 1200 06/11/24 1226  BP: (!) 133/95 133/79 122/76   Pulse: 94 94 94   Resp: 18 16 (!) 21   Temp:    99 F (37.2 C)  TempSrc:    Oral  SpO2: 100% 97% 96%   Weight:      Height:        GENERAL:  Alert, pleasant, no acute distress  HEENT:  EOMI CARDIOVASCULAR:  RRR, no murmurs appreciated RESPIRATORY:  Clear to auscultation, no wheezing, rales, or rhonchi GASTROINTESTINAL: Diffusely mildly tender EXTREMITIES:  No LE edema bilaterally NEURO:  No new focal deficits appreciated SKIN:  No rashes noted PSYCH:  Appropriate mood and affect, anxious    Data Reviewed:  Imaging Studies: CT Angio Abd/Pel W and/or Wo Contrast Result Date: 06/10/2024 CLINICAL DATA:  Metastatic ovarian cancer with worsening abdominal pain EXAM: CTA ABDOMEN AND PELVIS WITHOUT AND WITH CONTRAST TECHNIQUE: Multidetector CT imaging of the abdomen and pelvis was performed using the standard protocol during bolus administration of intravenous contrast. Multiplanar reconstructed images and MIPs were obtained and reviewed to evaluate the vascular anatomy. RADIATION DOSE REDUCTION: This exam was performed according to the departmental dose-optimization program which includes automated exposure control, adjustment of the mA and/or kV according to patient size and/or use of iterative reconstruction technique. CONTRAST:  OMNIPAQUE  IOHEXOL  350 MG/ML SOLN COMPARISON:  CTA chest dated 06/06/2024, CTA abdomen and pelvis dated 06/05/2024 and multiple priors FINDINGS: VASCULAR Aorta: Normal caliber aorta without aneurysm, dissection, vasculitis or significant  stenosis. Mild aortic atherosclerosis. Celiac: Patent without evidence of aneurysm,  dissection, vasculitis or significant stenosis. Prior gastroduodenal artery embolization. SMA: Patent without evidence of aneurysm, dissection, vasculitis or significant stenosis. Renals: Single bilateral renal arteries. Both renal arteries are patent without evidence of aneurysm, dissection, vasculitis, fibromuscular dysplasia or significant stenosis. IMA: Patent without evidence of aneurysm, dissection, vasculitis or significant stenosis. Inflow: Mild narrowing of the bilateral internal iliac artery origins due to atherosclerotic plaque. Otherwise patent without evidence of aneurysm, dissection, vasculitis or significant stenosis. Proximal Outflow: Bilateral common femoral and visualized portions of the superficial and profunda femoral arteries are patent without evidence of aneurysm, dissection, vasculitis or significant stenosis. Veins: Interval placement of infrarenal IVC filter. Focal filling defect within the right great saphenous vein immediately distal to the saphenofemoral junction (16:197), which appears new compared to 06/05/2024. Filling defect within the left common femoral vein (16:210). Review of the MIP images confirms the above findings. NON-VASCULAR Lower chest: No focal consolidation or pulmonary nodule in the lung bases. No pleural effusion or pneumothorax demonstrated. Partially imaged heart size is normal. Unchanged partially imaged bilateral lower lobe pulmonary emboli. Unchanged 3.4 x 2.5 cm right cardiophrenic nodule (16: 14). Hepatobiliary: Again seen are heterogeneously enhancing bilobar hepatic masses. Index lesions: -3.8 x 2.8 cm segment 7/8 (16:38) unchanged when remeasured -9.5 x 6.2 cm segment 4 (16:29), previously 8.7 x 6.6 cm -6.2 x 4.5 cm segment 2 (16:27), previously 5.4 x 4.2 cm No intra or extrahepatic biliary ductal dilation. Gallbladder is laterally displaced. Mild gallbladder mural thickening and pericholecystic free fluid. Pancreas: No focal lesions or main ductal dilation.  Spleen: Normal in size without focal abnormality. Adrenals/Urinary Tract: Unchanged right adrenal nodule size measuring 2.9 x 2.1 cm (16:51) when remeasured. Nodule measures 23 HU precontrast. No left adrenal nodule. Duplex kidneys. No suspicious renal mass or hydronephrosis. Unchanged linear 7 mm calculus at the left ureterovesical junction (16:171). No focal bladder wall thickening. Stomach/Bowel: Normal appearance of the stomach. Again seen is heterogeneously enhancing mass within the right hemiabdomen centered at the proximal duodenum measuring 11.0 x 8.6 cm (16:78), previously 9.8 x 9.2 cm when measured similarly. The mass continues exerts local mass effect with lateral displacement of the gallbladder and continued effacement of the SMV portal confluence. Findings suspicious for interval invasion of the proximal transverse colon with marked luminal narrowing. Along the periphery of this mass, there are areas of irregular arterial hyperenhancement, for example posteroinferior (9:110), which expands on the venous phase image (16:107). Appendix is not discretely seen. Lymphatic: Slightly increased size of right pelvic sidewall nodule measuring 1.6 x 1.4 cm (16:152), previously 1.5 x 1.2 cm. Unchanged 1.0 cm left para-aortic lymph node (16:86). Increased size of right lower quadrant nodule measuring 3.5 x 2.6 cm (16:130), previously 3 1 x 2.5 cm (remeasured). Reproductive: Status post hysterectomy and bilateral salpingo oophorectomy. Unchanged 2.0 x 1.8 cm nodular soft tissue focus, inseparable from the right vaginal cuff (16:163). Other: Trace right hemi abdominal free fluid. No fluid collection. Asymmetric thickening of the right anterior peritoneum adjacent to the duodenal mass. Musculoskeletal: No acute or abnormal lytic or blastic osseous lesions. Postsurgical changes of L4-S1 spinal fusion. Hardware appears intact. Subcutaneous soft tissue stranding and small focus of hyperdensity in the right gluteal region,  likely postprocedural. Mild body wall edema. Small fat-containing paraumbilical hernia. IMPRESSION: 1. No acute aortic pathology. 2. Interval placement of infrarenal IVC filter. Filling defect within the right great saphenous vein immediately distal to the saphenofemoral femoral junction, which appears  new compared to 06/05/2024, suspicious for thrombus. Filling defect within the left common femoral vein, consistent with known thrombus. 3. Unchanged partially imaged bilateral lower lobe pulmonary emboli. 4. Increased size of the heterogeneously enhancing mass within the right hemiabdomen centered at the proximal duodenum with findings suspicious for interval invasion of the proximal transverse colon resulting in marked luminal narrowing. Along the periphery of this mass, there are areas of irregular arterial hyperenhancement, which expands on the venous phase image, favored to represent areas of progressive tumoral enhancement, although areas of active extravasation could have a similar appearance. 5. Bilobar hepatic metastases, many of which have increased in size. Increased size of right lower quadrant nodule and right pelvic sidewall nodule. 6. Unchanged right cardiophrenic and adrenal nodules and left para-aortic lymph node. 7. Unchanged linear 7 mm calculus at the left ureterovesical junction. No hydronephrosis. 8.  Aortic Atherosclerosis (ICD10-I70.0). Electronically Signed   By: Limin  Xu M.D.   On: 06/10/2024 15:48   DG Chest Portable 1 View Result Date: 06/10/2024 CLINICAL DATA:  Shortness of breath EXAM: PORTABLE CHEST 1 VIEW COMPARISON:  June 04, 2024 FINDINGS: The heart size and mediastinal contours are within normal limits. Right internal jugular Port-A-Cath is unchanged. Both lungs are clear. The visualized skeletal structures are unremarkable. IMPRESSION: No active disease. Electronically Signed   By: Lynwood Landy Raddle M.D.   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 EXAM: Title; COIL EMBOLIZATION OF GASTRODUODENAL ARTERY Listed procedures; 1.  ULTRASOUND-GUIDED RIGHT COMMON FEMORAL ARTERY ACCESS 2. MESENTERIC ARTERIOGRAPHY, including CELIAC, SUPERIOR MESENTERIC ARTERY and GASTRODUODENAL ARTERIOGRAMS 3. COIL EMBOLIZATION of the GASTRODUODENAL ARTERY 4. GELFOAM EMBOLIZATION of SUPERIOR MESENTERIC ARTERY BRANCHES COMPARISON:  CT AP, 06/05/2024. MEDICATIONS: 2 g cefoxitin  IV. The antibiotics were administered within 60 minutes of procedural initiation. 4 mg Zofran  IV.  50 mg albumin  IV ANESTHESIA/SEDATION: Moderate (conscious) sedation was employed during this procedure. A total of Versed  5 mg and Fentanyl  250 mcg was administered intravenously. Moderate Sedation Time: 90 minutes. The patient's level of consciousness and vital signs were monitored continuously by radiology nursing throughout the procedure under my direct supervision. CONTRAST:  140 mL Omnipaque  300 FLUOROSCOPY: Radiation Exposure Index and estimated peak skin dose (PSD); Reference air kerma (RAK), 429.3 mGy. COMPLICATIONS: None immediate. PROCEDURE: Informed consent was obtained from the patient and/or patient's representative following explanation of the procedure, risks, benefits and alternatives. All questions were addressed. A time out was performed prior to the initiation of the procedure. Maximal barrier sterile technique utilized including caps, mask, sterile gowns, sterile gloves, large sterile drape, hand hygiene, and chlorhexidine  prep. The RIGHT femoral head was marked fluoroscopically. Under sterile conditions and local anesthesia, the RIGHT common femoral artery access was performed with a micropuncture needle. Under direct ultrasound guidance, the RIGHT common femoral was accessed with a micropuncture kit. An ultrasound image was saved for  documentation purposes. This allowed for placement of a 6 Fr 35 cm vascular sheath. A limited arteriogram was performed through the side arm of the sheath confirming appropriate access within the RIGHT common femoral artery. A limited abdominal aortogram was performed to help identify the celiac artery origin. Over a Bentson wire, a C2 catheter was advanced, back bled and flushed. The catheter was then utilized to select the celiac access, then advanced into the common hepatic then gastroduodenal arteries. Selective mesenteric arteriograms were  performed at each level. Using a 2.8 Fr Progreat microcatheter and 0.016 inch Fathom microwire access into the gastroduodenal artery was performed and a selective arteriogram was performed. Selective embolization with multiple 0.018 inch micro coils was performed. The microcatheter was removed and arteriogram with the 5 Fr catheter at the common hepatic artery was performed. Adequate pruning of the GDA was achieved on post embolization arteriogram. The C2 catheter was replaced for a Sos catheter, which was then used to select the superior mesenteric artery. Additional selective arteriogram was performed and access into the branches of the SMA, including the inferior pancreaticoduodenal and middle colic artery was obtained with the microcatheter. Gel-Foam embolization was then performed to near-stasis. Post embolization arteriogram demonstrated adequate pruning of the SMA branches supplying the tumor. Images were reviewed and the procedure was terminated. All wires, catheters and sheaths were removed from the patient. Hemostasis was achieved at the RIGHT groin access site with Angio-Seal closure. The patient tolerated the procedure well without immediate post procedural complication. FINDINGS: *Celiac and superior mesenteric arteriograms with normal order and branching. *Multivessel arterial supply to the large duodenal tumor, from the GDA and branches off the SMA, including  the inferior pancreaticoduodenal and middle colic arteries. Tumor blush was identified, however no active extravasation was noted. *Coil embolization of the GDA, with additional Gelfoam embolization of the aforementioned SMA branches, with adequate pruning of the vasculature. IMPRESSION: 1. Mesenteric arteriography for duodenal mass, with arterial supply from the celiac axis at the GDA, and the SMA at the inferior pancreaticoduodenal and middle colic arteries. Tumor blush without evidence of active extravasation from the duodenal mass. 2. Successful coil embolization of the gastroduodenal artery, and Gelfoam embolization of the SMA branches without significant residual tumoral enhancement. Thom Hall, MD [facility] Radiology Electronically Signed   By: Thom Hall M.D.   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 EXAM: Title; COIL EMBOLIZATION OF GASTRODUODENAL ARTERY Listed procedures; 1.  ULTRASOUND-GUIDED RIGHT COMMON FEMORAL ARTERY ACCESS 2. MESENTERIC ARTERIOGRAPHY, including CELIAC, SUPERIOR MESENTERIC ARTERY and GASTRODUODENAL ARTERIOGRAMS 3. COIL EMBOLIZATION of the GASTRODUODENAL ARTERY 4. GELFOAM EMBOLIZATION of SUPERIOR MESENTERIC ARTERY BRANCHES COMPARISON:  CT AP, 06/05/2024. MEDICATIONS: 2 g cefoxitin  IV. The antibiotics were administered within 60 minutes of procedural initiation. 4 mg Zofran  IV.  50 mg albumin  IV ANESTHESIA/SEDATION: Moderate (conscious) sedation was employed during this procedure. A total of Versed  5 mg and Fentanyl  250 mcg was administered intravenously. Moderate Sedation Time: 90 minutes. The patient's level of consciousness and vital signs were monitored continuously by radiology nursing throughout the procedure under my direct supervision. CONTRAST:   140 mL Omnipaque  300 FLUOROSCOPY: Radiation Exposure Index and estimated peak skin dose (PSD); Reference air kerma (RAK), 429.3 mGy. COMPLICATIONS: None immediate. PROCEDURE: Informed consent was obtained from the patient and/or patient's representative following explanation of the procedure, risks, benefits and alternatives. All questions were addressed. A time out was performed prior to the initiation of the procedure. Maximal barrier sterile technique utilized including caps, mask, sterile gowns, sterile gloves, large sterile drape, hand hygiene, and chlorhexidine  prep. The RIGHT femoral head was marked fluoroscopically. Under sterile conditions and local anesthesia, the RIGHT common femoral artery access was performed with a micropuncture needle. Under direct ultrasound guidance, the RIGHT common femoral was accessed with a micropuncture kit. An ultrasound image was saved for documentation purposes. This  allowed for placement of a 6 Fr 35 cm vascular sheath. A limited arteriogram was performed through the side arm of the sheath confirming appropriate access within the RIGHT common femoral artery. A limited abdominal aortogram was performed to help identify the celiac artery origin. Over a Bentson wire, a C2 catheter was advanced, back bled and flushed. The catheter was then utilized to select the celiac access, then advanced into the common hepatic then gastroduodenal arteries. Selective mesenteric arteriograms were performed at each level. Using a 2.8 Fr Progreat microcatheter and 0.016 inch Fathom microwire access into the gastroduodenal artery was performed and a selective arteriogram was performed. Selective embolization with multiple 0.018 inch micro coils was performed. The microcatheter was removed and arteriogram with the 5 Fr catheter at the common hepatic artery was performed. Adequate pruning of the GDA was achieved on post embolization arteriogram. The C2 catheter was replaced for a Sos catheter,  which was then used to select the superior mesenteric artery. Additional selective arteriogram was performed and access into the branches of the SMA, including the inferior pancreaticoduodenal and middle colic artery was obtained with the microcatheter. Gel-Foam embolization was then performed to near-stasis. Post embolization arteriogram demonstrated adequate pruning of the SMA branches supplying the tumor. Images were reviewed and the procedure was terminated. All wires, catheters and sheaths were removed from the patient. Hemostasis was achieved at the RIGHT groin access site with Angio-Seal closure. The patient tolerated the procedure well without immediate post procedural complication. FINDINGS: *Celiac and superior mesenteric arteriograms with normal order and branching. *Multivessel arterial supply to the large duodenal tumor, from the GDA and branches off the SMA, including the inferior pancreaticoduodenal and middle colic arteries. Tumor blush was identified, however no active extravasation was noted. *Coil embolization of the GDA, with additional Gelfoam embolization of the aforementioned SMA branches, with adequate pruning of the vasculature. IMPRESSION: 1. Mesenteric arteriography for duodenal mass, with arterial supply from the celiac axis at the GDA, and the SMA at the inferior pancreaticoduodenal and middle colic arteries. Tumor blush without evidence of active extravasation from the duodenal mass. 2. Successful coil embolization of the gastroduodenal artery, and Gelfoam embolization of the SMA branches without significant residual tumoral enhancement. Thom Hall, MD [facility] Radiology Electronically Signed   By: Thom Hall M.D.   On: 06/09/2024 14:52   IR Angiogram Selective Each Additional Vessel Result Date: 06/09/2024 INDICATION: duodenal mass - bleeding Briefly, 69 year old female with a history of metastatic ovarian cancer and bleeding  duodenal metastasis. EXAM: Title; COIL EMBOLIZATION OF GASTRODUODENAL ARTERY Listed procedures; 1.  ULTRASOUND-GUIDED RIGHT COMMON FEMORAL ARTERY ACCESS 2. MESENTERIC ARTERIOGRAPHY, including CELIAC, SUPERIOR MESENTERIC ARTERY and GASTRODUODENAL ARTERIOGRAMS 3. COIL EMBOLIZATION of the GASTRODUODENAL ARTERY 4. GELFOAM EMBOLIZATION of SUPERIOR MESENTERIC ARTERY BRANCHES COMPARISON:  CT AP, 06/05/2024. MEDICATIONS: 2 g cefoxitin  IV. The antibiotics were administered within 60 minutes of procedural initiation. 4 mg Zofran  IV.  50 mg albumin  IV ANESTHESIA/SEDATION: Moderate (conscious) sedation was employed during this procedure. A total of Versed  5 mg and Fentanyl  250 mcg was administered intravenously. Moderate Sedation Time: 90 minutes. The patient's level of consciousness and vital signs were monitored continuously by radiology nursing throughout the procedure under my direct supervision. CONTRAST:  140 mL Omnipaque  300 FLUOROSCOPY: Radiation Exposure Index and estimated peak skin dose (PSD); Reference air kerma (RAK), 429.3 mGy. COMPLICATIONS: None immediate. PROCEDURE: Informed consent was obtained from the patient and/or patient's representative following explanation of the procedure, risks, benefits and alternatives. All  questions were addressed. A time out was performed prior to the initiation of the procedure. Maximal barrier sterile technique utilized including caps, mask, sterile gowns, sterile gloves, large sterile drape, hand hygiene, and chlorhexidine  prep. The RIGHT femoral head was marked fluoroscopically. Under sterile conditions and local anesthesia, the RIGHT common femoral artery access was performed with a micropuncture needle. Under direct ultrasound guidance, the RIGHT common femoral was accessed with a micropuncture kit. An ultrasound image was saved for documentation purposes. This allowed for placement of a 6 Fr 35 cm vascular sheath. A limited arteriogram was performed through the side arm of  the sheath confirming appropriate access within the RIGHT common femoral artery. A limited abdominal aortogram was performed to help identify the celiac artery origin. Over a Bentson wire, a C2 catheter was advanced, back bled and flushed. The catheter was then utilized to select the celiac access, then advanced into the common hepatic then gastroduodenal arteries. Selective mesenteric arteriograms were performed at each level. Using a 2.8 Fr Progreat microcatheter and 0.016 inch Fathom microwire access into the gastroduodenal artery was performed and a selective arteriogram was performed. Selective embolization with multiple 0.018 inch micro coils was performed. The microcatheter was removed and arteriogram with the 5 Fr catheter at the common hepatic artery was performed. Adequate pruning of the GDA was achieved on post embolization arteriogram. The C2 catheter was replaced for a Sos catheter, which was then used to select the superior mesenteric artery. Additional selective arteriogram was performed and access into the branches of the SMA, including the inferior pancreaticoduodenal and middle colic artery was obtained with the microcatheter. Gel-Foam embolization was then performed to near-stasis. Post embolization arteriogram demonstrated adequate pruning of the SMA branches supplying the tumor. Images were reviewed and the procedure was terminated. All wires, catheters and sheaths were removed from the patient. Hemostasis was achieved at the RIGHT groin access site with Angio-Seal closure. The patient tolerated the procedure well without immediate post procedural complication. FINDINGS: *Celiac and superior mesenteric arteriograms with normal order and branching. *Multivessel arterial supply to the large duodenal tumor, from the GDA and branches off the SMA, including the inferior pancreaticoduodenal and middle colic arteries. Tumor blush was identified, however no active extravasation was noted. *Coil  embolization of the GDA, with additional Gelfoam embolization of the aforementioned SMA branches, with adequate pruning of the vasculature. IMPRESSION: 1. Mesenteric arteriography for duodenal mass, with arterial supply from the celiac axis at the GDA, and the SMA at the inferior pancreaticoduodenal and middle colic arteries. Tumor blush without evidence of active extravasation from the duodenal mass. 2. Successful coil embolization of the gastroduodenal artery, and Gelfoam embolization of the SMA branches without significant residual tumoral enhancement. Thom Hall, MD [facility] [facility] Radiology Electronically Signed   By: Thom Hall M.D.   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 EXAM: Title; COIL EMBOLIZATION OF GASTRODUODENAL ARTERY Listed procedures; 1.  ULTRASOUND-GUIDED RIGHT COMMON FEMORAL ARTERY ACCESS 2. MESENTERIC ARTERIOGRAPHY, including CELIAC, SUPERIOR MESENTERIC ARTERY and GASTRODUODENAL ARTERIOGRAMS 3. COIL EMBOLIZATION of the GASTRODUODENAL ARTERY 4. GELFOAM EMBOLIZATION of SUPERIOR MESENTERIC ARTERY BRANCHES COMPARISON:  CT AP, 06/05/2024. MEDICATIONS: 2 g cefoxitin  IV. The antibiotics were administered within 60 minutes of procedural initiation. 4 mg Zofran  IV.  50 mg albumin  IV ANESTHESIA/SEDATION: Moderate (conscious) sedation was employed during this procedure. A total  of Versed  5 mg and Fentanyl  250 mcg was administered intravenously. Moderate Sedation Time: 90 minutes. The patient's level of consciousness and vital signs were monitored continuously by radiology nursing throughout the procedure under my direct supervision. CONTRAST:  140 mL Omnipaque  300 FLUOROSCOPY: Radiation Exposure Index and estimated peak skin dose (PSD); Reference air kerma (RAK),  429.3 mGy. COMPLICATIONS: None immediate. PROCEDURE: Informed consent was obtained from the patient and/or patient's representative following explanation of the procedure, risks, benefits and alternatives. All questions were addressed. A time out was performed prior to the initiation of the procedure. Maximal barrier sterile technique utilized including caps, mask, sterile gowns, sterile gloves, large sterile drape, hand hygiene, and chlorhexidine  prep. The RIGHT femoral head was marked fluoroscopically. Under sterile conditions and local anesthesia, the RIGHT common femoral artery access was performed with a micropuncture needle. Under direct ultrasound guidance, the RIGHT common femoral was accessed with a micropuncture kit. An ultrasound image was saved for documentation purposes. This allowed for placement of a 6 Fr 35 cm vascular sheath. A limited arteriogram was performed through the side arm of the sheath confirming appropriate access within the RIGHT common femoral artery. A limited abdominal aortogram was performed to help identify the celiac artery origin. Over a Bentson wire, a C2 catheter was advanced, back bled and flushed. The catheter was then utilized to select the celiac access, then advanced into the common hepatic then gastroduodenal arteries. Selective mesenteric arteriograms were performed at each level. Using a 2.8 Fr Progreat microcatheter and 0.016 inch Fathom microwire access into the gastroduodenal artery was performed and a selective arteriogram was performed. Selective embolization with multiple 0.018 inch micro coils was performed. The microcatheter was removed and arteriogram with the 5 Fr catheter at the common hepatic artery was performed. Adequate pruning of the GDA was achieved on post embolization arteriogram. The C2 catheter was replaced for a Sos catheter, which was then used to select the superior mesenteric artery. Additional selective arteriogram was performed and access into  the branches of the SMA, including the inferior pancreaticoduodenal and middle colic artery was obtained with the microcatheter. Gel-Foam embolization was then performed to near-stasis. Post embolization arteriogram demonstrated adequate pruning of the SMA branches supplying the tumor. Images were reviewed and the procedure was terminated. All wires, catheters and sheaths were removed from the patient. Hemostasis was achieved at the RIGHT groin access site with Angio-Seal closure. The patient tolerated the procedure well without immediate post procedural complication. FINDINGS: *Celiac and superior mesenteric arteriograms with normal order and branching. *Multivessel arterial supply to the large duodenal tumor, from the GDA and branches off the SMA, including the inferior pancreaticoduodenal and middle colic arteries. Tumor blush was identified, however no active extravasation was noted. *Coil embolization of the GDA, with additional Gelfoam embolization of the aforementioned SMA branches, with adequate pruning of the vasculature. IMPRESSION: 1. Mesenteric arteriography for duodenal mass, with arterial supply from the celiac axis at the GDA, and the SMA at the inferior pancreaticoduodenal and middle colic arteries. Tumor blush without evidence of active extravasation from the duodenal mass. 2. Successful coil embolization of the gastroduodenal artery, and Gelfoam embolization of the SMA branches without significant residual tumoral enhancement. Thom Hall, MD [facility] Radiology Electronically Signed   By: Thom Hall M.D.   On: 06/09/2024 14:52   IR EMBO TUMOR ORGAN ISCHEMIA INFARCT INC GUIDE ROADMAPPING Result Date: 06/09/2024 INDICATION: duodenal mass - bleeding Briefly, 69 year old female with a history of metastatic ovarian cancer and  bleeding duodenal metastasis. EXAM: Title; COIL EMBOLIZATION OF GASTRODUODENAL ARTERY Listed procedures; 1.   ULTRASOUND-GUIDED RIGHT COMMON FEMORAL ARTERY ACCESS 2. MESENTERIC ARTERIOGRAPHY, including CELIAC, SUPERIOR MESENTERIC ARTERY and GASTRODUODENAL ARTERIOGRAMS 3. COIL EMBOLIZATION of the GASTRODUODENAL ARTERY 4. GELFOAM EMBOLIZATION of SUPERIOR MESENTERIC ARTERY BRANCHES COMPARISON:  CT AP, 06/05/2024. MEDICATIONS: 2 g cefoxitin  IV. The antibiotics were administered within 60 minutes of procedural initiation. 4 mg Zofran  IV.  50 mg albumin  IV ANESTHESIA/SEDATION: Moderate (conscious) sedation was employed during this procedure. A total of Versed  5 mg and Fentanyl  250 mcg was administered intravenously. Moderate Sedation Time: 90 minutes. The patient's level of consciousness and vital signs were monitored continuously by radiology nursing throughout the procedure under my direct supervision. CONTRAST:  140 mL Omnipaque  300 FLUOROSCOPY: Radiation Exposure Index and estimated peak skin dose (PSD); Reference air kerma (RAK), 429.3 mGy. COMPLICATIONS: None immediate. PROCEDURE: Informed consent was obtained from the patient and/or patient's representative following explanation of the procedure, risks, benefits and alternatives. All questions were addressed. A time out was performed prior to the initiation of the procedure. Maximal barrier sterile technique utilized including caps, mask, sterile gowns, sterile gloves, large sterile drape, hand hygiene, and chlorhexidine  prep. The RIGHT femoral head was marked fluoroscopically. Under sterile conditions and local anesthesia, the RIGHT common femoral artery access was performed with a micropuncture needle. Under direct ultrasound guidance, the RIGHT common femoral was accessed with a micropuncture kit. An ultrasound image was saved for documentation purposes. This allowed for placement of a 6 Fr 35 cm vascular sheath. A limited arteriogram was performed through the side arm of the sheath confirming appropriate access within the RIGHT common femoral artery. A limited  abdominal aortogram was performed to help identify the celiac artery origin. Over a Bentson wire, a C2 catheter was advanced, back bled and flushed. The catheter was then utilized to select the celiac access, then advanced into the common hepatic then gastroduodenal arteries. Selective mesenteric arteriograms were performed at each level. Using a 2.8 Fr Progreat microcatheter and 0.016 inch Fathom microwire access into the gastroduodenal artery was performed and a selective arteriogram was performed. Selective embolization with multiple 0.018 inch micro coils was performed. The microcatheter was removed and arteriogram with the 5 Fr catheter at the common hepatic artery was performed. Adequate pruning of the GDA was achieved on post embolization arteriogram. The C2 catheter was replaced for a Sos catheter, which was then used to select the superior mesenteric artery. Additional selective arteriogram was performed and access into the branches of the SMA, including the inferior pancreaticoduodenal and middle colic artery was obtained with the microcatheter. Gel-Foam embolization was then performed to near-stasis. Post embolization arteriogram demonstrated adequate pruning of the SMA branches supplying the tumor. Images were reviewed and the procedure was terminated. All wires, catheters and sheaths were removed from the patient. Hemostasis was achieved at the RIGHT groin access site with Angio-Seal closure. The patient tolerated the procedure well without immediate post procedural complication. FINDINGS: *Celiac and superior mesenteric arteriograms with normal order and branching. *Multivessel arterial supply to the large duodenal tumor, from the GDA and branches off the SMA, including the inferior pancreaticoduodenal and middle colic arteries. Tumor blush was identified, however no active extravasation was noted. *Coil embolization of the GDA, with additional Gelfoam embolization of the aforementioned SMA branches,  with adequate pruning of the vasculature. IMPRESSION: 1. Mesenteric arteriography for duodenal mass, with arterial supply from the celiac axis at the GDA, and the SMA at the inferior pancreaticoduodenal and middle  colic arteries. Tumor blush without evidence of active extravasation from the duodenal mass. 2. Successful coil embolization of the gastroduodenal artery, and Gelfoam embolization of the SMA branches without significant residual tumoral enhancement. Thom Hall, MD [facility] Radiology Electronically Signed   By: Thom Hall M.D.   On: 06/09/2024 14:52   VAS US  LOWER EXTREMITY VENOUS (DVT) Result Date: 06/09/2024  Lower Venous DVT Study Patient Name:  ZAYLIA RIOLO  Date of Exam:   06/06/2024 Medical Rec #: [id number]        Accession #:    [id number] Date of Birth: May 25, 1955       Patient Gender: F Patient Age:   64 years Exam Location:  [facility] Procedure:      VAS US  LOWER EXTREMITY VENOUS (DVT) Referring Phys: THOM HALL --------------------------------------------------------------------------------  Indications: Incidental pulmonary embolism on abdominal CTA 06/05/24 Other Indications: Status post mesenteric arteriography and duodenal mass                    embolization with IVC filter placement 06/06/24. 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 Limitations: Bandage right groin. Comparison Study: Prior negative left LEV done 05/29/22 Performing Technologist: Alberta Lis RVS  Examination Guidelines: A complete evaluation includes B-mode imaging, spectral Doppler, color Doppler, and power Doppler as needed of all accessible portions of each vessel. Bilateral testing is considered an integral part of a complete examination. Limited examinations for reoccurring indications may be performed as noted. The reflux portion of the exam is performed with the patient in reverse Trendelenburg.   +---------+---------------+---------+-----------+----------+--------------+ RIGHT    CompressibilityPhasicitySpontaneityPropertiesThrombus Aging +---------+---------------+---------+-----------+----------+--------------+ CFV      Full           Yes      No                                  +---------+---------------+---------+-----------+----------+--------------+ SFJ      Full                                                        +---------+---------------+---------+-----------+----------+--------------+ FV Prox  Full           Yes      No                                  +---------+---------------+---------+-----------+----------+--------------+ FV Mid   Full                                                        +---------+---------------+---------+-----------+----------+--------------+ FV DistalFull                                                        +---------+---------------+---------+-----------+----------+--------------+ PFV      Full           Yes  No                                  +---------+---------------+---------+-----------+----------+--------------+ POP      Full           Yes      No                                  +---------+---------------+---------+-----------+----------+--------------+ PTV      Full                                                        +---------+---------------+---------+-----------+----------+--------------+ PERO     Full                                                        +---------+---------------+---------+-----------+----------+--------------+   +---------+---------------+---------+-----------+----------+--------------+ LEFT     CompressibilityPhasicitySpontaneityPropertiesThrombus Aging +---------+---------------+---------+-----------+----------+--------------+ CFV      Full           Yes      No                                   +---------+---------------+---------+-----------+----------+--------------+ SFJ      Full                                                        +---------+---------------+---------+-----------+----------+--------------+ FV Prox  None           No       No                   Acute          +---------+---------------+---------+-----------+----------+--------------+ FV Mid   None           No       No                   Acute          +---------+---------------+---------+-----------+----------+--------------+ FV DistalNone           No       No                   Acute          +---------+---------------+---------+-----------+----------+--------------+ PFV      Full           Yes      No                                  +---------+---------------+---------+-----------+----------+--------------+ POP      None           No       No  Acute          +---------+---------------+---------+-----------+----------+--------------+ PTV      None                                         Acute          +---------+---------------+---------+-----------+----------+--------------+ PERO     None                                         Acute          +---------+---------------+---------+-----------+----------+--------------+     Summary: RIGHT: - There is no evidence of deep vein thrombosis in the lower extremity.  - No cystic structure found in the popliteal fossa.  LEFT: - Findings consistent with acute deep vein thrombosis involving the left femoral vein, left popliteal vein, left posterior tibial veins, and left peroneal veins.  - No cystic structure found in the popliteal fossa.  *See table(s) above for measurements and observations. Electronically signed by Gaile New MD on 06/09/2024 at 10:45:02 AM.    Final    IR IVC FILTER PLMT / S&I PORTER GUID/MOD SED Result Date: 06/07/2024 INDICATION: duodenal mass Briefly, 69 year old female with a history of metastatic  ovarian cancer, bleeding duodenal metastasis, and new, incidental PE and LEFT lower extremity DVT. Unable to anticoagulate. EXAM: Title; INFERIOR VENA CAVA (IVC) FILTER PLACEMENT Listed procedures; 1. ULTRASOUND-GUIDED RIGHT GREATER SAPHENOUS VEIN ACCESS 2. CENTRAL VENOGRAM 3. INFERIOR VENA CAVA FILTER PLACEMENT MEDICATIONS: None. ANESTHESIA/SEDATION: Moderate (conscious) sedation was employed during this procedure. A total of Versed  1 mg and Fentanyl  50 mcg was administered intravenously. Moderate Sedation Time: 18 minutes. The patient's level of consciousness and vital signs were monitored continuously by radiology nursing throughout the procedure under my direct supervision. CONTRAST:  60 mL Omnipaque  300 FLUOROSCOPY: Radiation Exposure Index and estimated peak skin dose (PSD); Reference air kerma (RAK), 111.9 mGy. COMPLICATIONS: None immediate. PROCEDURE: Informed written consent was obtained from the patient and/or patient's representative following explanation of the procedure, risks, benefits and alternatives. A time out was performed prior to the initiation of the procedure. Maximal barrier sterile technique utilized including caps, mask, sterile gowns, sterile gloves, large sterile drape, hand hygiene, and sterile prep. Under sterile condition and local anesthesia, RIGHT greater saphenous vein access was performed with ultrasound. An ultrasound image was saved and sent to PACS. Over a guidewire, the IVC filter delivery sheath and inner dilator were advanced into the IVC just above the IVC bifurcation. Contrast injection was performed for an IVC venogram. Through the delivery sheath, a retrievable Denali IVC filter was deployed below the level of the renal veins and above the IVC bifurcation. Limited post deployment venacavagram was performed. The delivery sheath was removed and hemostasis was obtained with manual compression. A dressing was placed. The patient tolerated the procedure well without immediate  post procedural complication. FINDINGS: The IVC is patent. No evidence of thrombus, stenosis, or occlusion. No variant venous anatomy. IMPRESSION: Successful placement of a retrievable infrarenal inferior vena cava (IVC) filter PLAN: IVC filters can cause complications when left in place for extended periods of time. If medically appropriate, recommend discontinuing filter prior to discharge. Please re-evaluate the patient for filter discontinuation when they are seen in follow up, and refer patient to Interventional Radiology for removal. Thom Hall, MD Vascular  and Interventional Radiology Specialists [facility] Radiology Electronically Signed   By: Thom Hall M.D.   On: 06/07/2024 07:44   CT Angio Chest Pulmonary Embolism (PE) W or WO Contrast Result Date: 06/06/2024 CLINICAL DATA:  Acute pulmonary embolism, unspecified pulmonary embolism type, unspecified whether acute cor pulmonale present. Pulmonary embolism seen on CTA from 06/05/2024. EXAM: CT ANGIOGRAPHY CHEST WITH CONTRAST TECHNIQUE: Multidetector CT imaging of the chest was performed using the standard protocol during bolus administration of intravenous contrast. Multiplanar CT image reconstructions and MIPs were obtained to evaluate the vascular anatomy. RADIATION DOSE REDUCTION: This exam was performed according to the departmental dose-optimization program which includes automated exposure control, adjustment of the mA and/or kV according to patient size and/or use of iterative reconstruction technique. CONTRAST:  75mL OMNIPAQUE  IOHEXOL  350 MG/ML SOLN COMPARISON:  CTA abdomen pelvis 06/05/2024 FINDINGS: Cardiovascular: Positive for bilateral pulmonary emboli. Moderate clot burden with predominantly nonocclusive clot bilaterally. There is a small amount of clot in the distal right pulmonary artery extending into the truncus anterior and right interlobar artery. Clot extending into right segmental pulmonary arteries. Clot in the left lobar and  segmental pulmonary arteries. No evidence for right heart strain. Normal caliber of the thoracic aorta. Arch great vessels are patent. Heart size is normal. No significant pericardial effusion. Embolization coils involving the gastroduodenal artery. Mediastinum/Nodes: Again noted is a soft tissue mass in the right anterior cardiophrenic fat measuring up to 3.4 cm. No significant mediastinal or hilar lymph node enlargement. No axillary lymph enlargement. Lungs/Pleura: Trachea and mainstem bronchi are patent. No pleural effusions. Small parenchymal densities or airspace densities in the medial right upper lobe on image 51, sequence 7 are new since 2023. Findings could be associated with infarct versus small area of airspace disease. 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 These findings are poorly characterized on this chest CT. Right adrenal nodule. Musculoskeletal: No acute bone abnormality. Review of the MIP images confirms the above findings. IMPRESSION: 1. Positive for bilateral pulmonary emboli with moderate clot burden. Pulmonary emboli are predominantly nonocclusive and findings could be subacute. No evidence for right heart strain. 2. Parenchymal densities in the medial right lung could represent a small infarct versus small area of infection/inflammation. 3. Neoplastic disease in the upper abdomen. These results were called by telephone at the time of interpretation on 06/06/2024 at 2:04 pm to provider Dr. Hall, Who verbally acknowledged these results. Electronically Signed   By: Juliene Balder M.D.   On: 06/06/2024 14:09   CT Angio Abd/Pel w/ and/or w/o Result Date: 06/06/2024 EXAM: CTA ABDOMEN AND PELVIS WITHOUT AND WITH CONTRAST 06/05/2024 02:49:17 PM TECHNIQUE: CTA images of the abdomen and pelvis without and with intravenous contrast. 125 mL (iopamidol  (ISOVUE -370) 76 % injection 125 mL IOPAMIDOL  (ISOVUE -370) INJECTION 76%) was  administered. Three-dimensional MIP/volume rendered formations were performed. Automated exposure control, iterative reconstruction, and/or weight based adjustment of the mA/kV was utilized to reduce the radiation dose to as low as reasonably achievable. COMPARISON: Stable compared with the previous exam where applicable. New from prior exam where applicable. CLINICAL HISTORY: FINDINGS: VASCULATURE: AORTA: Aortic atherosclerotic calcification. No acute finding. No abdominal aortic aneurysm. No dissection. CELIAC TRUNK: No acute finding. No occlusion or significant stenosis. SUPERIOR MESENTERIC ARTERY: No acute finding. No occlusion or significant stenosis. RENAL ARTERIES: No acute finding. No occlusion or significant stenosis. ILIAC ARTERIES: No acute finding. No occlusion or significant stenosis. LIVER: Bilobar liver metastases. Index lesion within segment  7 measures 2.9 x 2.4 cm, axial image 24/7. Index lesion within segment 4a measures 8.7 x 6.6 cm, image 16/7. The index lesion within segment 2 measures 5.4 x 4.2 cm, axial image 16/7. GALLBLADDER AND BILE DUCTS: Circumferential gallbladder wall thickening of the gallbladder measures 4 mm. No biliary ductal dilatation. SPLEEN: The spleen is within normal limits in size and appearance. PANCREAS: The pancreas is normal in size and contour without focal lesion or ductal dilatation. ADRENAL GLANDS: There is a right adrenal nodule measuring 2.1 x 1.8 cm and 9 Hounsfield units, which is stable compared with the previous exam. KIDNEYS, URETERS AND BLADDER: Persistent stone within the urinary bladder near the expected location of the left UVJ measuring 7 mm in length, image 75/7. There is no hydronephrosis identified. No stones in the kidneys or ureters. No perinephric or periureteral stranding. 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 This measures 10.7 x  8.9 x 7.3 cm. On the previous exam this measured 7.2 x 7.5 x 6.9 cm. Mass effect on the patent extrahepatic portal vein is again noted. On today's exam the mass extends up and abuts the surface of the gallbladder with loss of a fat plane between these two structures. The mass also extends up to the serosal surface of the transverse colon with loss of a fat plane between these two structures as well. There is no pathologic dilatation of the bowel loops to indicate a mechanical obstruction. There is a moderate to large stool burden identified within the colon. No signs of active disease. No signs of intraluminal contrast extravasation to suggest active GI bleeding. REPRODUCTIVE: The uterus appears surgically absent. PERITONEUM AND RETRPERITONEUM: Signs of peritoneal metastases. Index a lesion within the right lower quadrant measures 3.1 cm (image 57/7). New from prior exam. Along the right parapelvic sidewall, there is a lesion measuring 1.2 cm, image 64/7. Midline periumbilical hernia contains fat only. No ascites or free air. LUNG BASE: Filling defect within a segmental branch of the right lower lobe pulmonary artery, compatible with incidental pulmonary embolus, axial image 2/5. LYMPH NODES: Abdominal adenopathy is identified. The index left retroperitoneal node measures 1.1 cm, image 39/7. On the previous exam, this measured 7 mm. BONES AND SOFT TISSUES: Postoperative changes noted within the lumbar spine. No acute or suspicious osseous findings. No acute soft tissue abnormality. IMPRESSION: 1. No evidence of active gastrointestinal bleeding on this CTA abdomen and pelvis. 2. Incidental pulmonary embolism identified within a segmental branch of the right lower lobe pulmonary artery. 3. 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 4. Bilobar hepatic metastatic disease.  New from prior. 5.  Progressive abdominal nodal metastatic disease. 6. Peritoneal metastatic disease, new from prior. Electronically signed by: Waddell Calk MD 06/06/2024 07:36 AM EST RP Workstation: [id number]   DG Chest Portable 1 View Result Date: 06/04/2024 EXAM: 1 VIEW(S) XRAY OF THE CHEST 06/04/2024 04:19:00 PM COMPARISON: 05/09/2024 CLINICAL HISTORY: Fever. FINDINGS: LINES, TUBES AND DEVICES: Right chest power injectable port in place with tip at the lower SVC. LUNGS AND PLEURA: Panlike density favoring atelectasis along the left hemidiaphragm. No pleural effusion. No pneumothorax. HEART AND MEDIASTINUM: No acute abnormality of the cardiac and mediastinal silhouettes. BONES AND SOFT TISSUES: No acute osseous abnormality. JOINTS: Mild degenerative glenohumeral arthropathy bilaterally. IMPRESSION: 1.  Mild left basilar atelectasis along the left hemidiaphragm. 2. Right chest power-injectable port with tip in the lower SVC. 3. Mild bilateral glenohumeral degenerative arthropathy. Electronically signed by: Ryan Salvage MD 06/04/2024 04:52 PM EST RP Workstation: [id number]   IR Radiologist Eval & Mgmt Result Date: 05/27/2024 EXAM: NEW PATIENT OFFICE VISIT CHIEF COMPLAINT: See below HISTORY OF PRESENT ILLNESS: See below REVIEW OF SYSTEMS: See below PHYSICAL EXAMINATION: See below ASSESSMENT AND PLAN: Please refer to completed note in the electronic medical record on  Epic Thom Hall, MD [facility] Electronically Signed   By: Thom Hall M.D.   On: 05/27/2024 09:02    There are no new results to review at this time.  Previous records (including but not limited to H&P, progress notes, nursing notes, TOC management) were reviewed in assessment of this patient.  Labs: CBC: Recent Labs  Lab 06/06/24 0752 06/10/24 1237 06/11/24 0602  WBC 7.2 8.6 7.6  HGB 8.6* 6.5* 8.2*  HCT 26.5* 20.2* 26.0*  MCV 88.6 88.2 89.3  PLT 188 255 259   Basic  Metabolic Panel: Recent Labs  Lab 06/04/24 1414 06/06/24 0752 06/10/24 1237 06/10/24 1340 06/11/24 0443  NA 134* 136 135  --  131*  K 3.7 3.5 3.0*  --  4.7  CL 98 101 99  --  99  CO2 26 25 23   --  22  GLUCOSE 118* 130* 126*  --  108*  BUN 12 18 12   --  10  CREATININE 0.85 0.82 0.58  --  0.67  CALCIUM 7.8* 8.2* 7.9*  --  7.6*  MG  --   --   --  1.7  --   PHOS  --   --   --  3.6  --    Liver Function Tests: Recent Labs  Lab 06/04/24 1414 06/10/24 1237  AST 43* 31  ALT 19 13  ALKPHOS 93 98  BILITOT 0.5 0.6  PROT 5.9* 5.7*  ALBUMIN  2.8* 2.8*   CBG: No results for input(s): GLUCAP in the last 168 hours.  Scheduled Meds:  dexamethasone   4 mg Oral Daily   hydroxychloroquine   200 mg Oral BID   mirtazapine   7.5 mg Oral QHS   oxyCODONE   15 mg Oral Q12H   pantoprazole   40 mg Oral Daily   [START ON 06/15/2024] Vitamin D  (Ergocalciferol )  50,000 Units Oral Q Sun   Continuous Infusions: PRN Meds:.acetaminophen  **OR** acetaminophen , albuterol , HYDROmorphone  (DILAUDID ) injection, LORazepam , naloxone , oxyCODONE , polyethylene glycol, prochlorperazine   Family Communication: None at bedside  Disposition: Status is: Inpatient Remains inpatient appropriate because: Intractable abdominal pain     Time spent: 37 minutes  Length of inpatient stay: 0 days  Author: Carliss LELON Canales, DO 06/11/2024 1:23 PM  For on call review [email or web address].   "

## 2024-06-11 NOTE — ED Notes (Signed)
 Pt assisted to the toilet w/ 1 person assist and walker. After returning to bed, pt seemed SOB but denied any SOB.

## 2024-06-11 NOTE — Progress Notes (Signed)
 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  The patient came in with worsening abdominal pain and anemia.  The patient had a CT angiography that showed:  IMPRESSION: 1. No acute aortic pathology. 2. Interval placement of infrarenal IVC filter. Filling defect within the right great saphenous vein immediately distal to the saphenofemoral femoral junction, which appears new compared to 06/05/2024, suspicious for thrombus. Filling defect within the left common femoral vein, consistent with known thrombus. 3. Unchanged partially imaged bilateral lower lobe pulmonary emboli. 4. Increased size of the heterogeneously enhancing mass within the right hemiabdomen centered at the proximal duodenum with findings suspicious for interval invasion of the proximal transverse colon resulting in marked luminal narrowing. Along the periphery of this mass, there are areas of irregular arterial hyperenhancement, which expands on the venous phase image, favored to represent areas of progressive tumoral enhancement, although areas of active extravasation could have a similar appearance. 5. Bilobar hepatic metastases, many of which have increased in size. Increased size of right lower quadrant nodule and right pelvic sidewall nodule. 6. Unchanged right cardiophrenic and adrenal nodules and left para-aortic lymph node. 7. Unchanged linear 7 mm calculus at the left ureterovesical junction. No hydronephrosis. 8.  Aortic Atherosclerosis  This is a very sad case of this very nice woman with the above findings.  The patient's symptoms are likely caused by the increasing size of this mass and this may also be causing the anemia.  There is nothing that can be done from a GI point of view to treat any of these issues.  I will sign off.  Please call if any further GI concerns or questions.  We would like  to thank you for the opportunity to participate in the care of [name].

## 2024-06-11 NOTE — Consult Note (Signed)
 "  Hematology/Oncology Consult note [phone]      Patient Care Team: Arloa Elsie SAUNDERS, MD as PCP - General (Family Medicine) Martinez, Sarah A, PA-C (Physician Assistant) Maurie Rayfield BIRCH, RN as Oncology Nurse Navigator Babara Call, MD as Consulting Physician (Oncology)   Name of the patient: [name]  [id number]  Jan 11, 1956   REASON FOR COSULTATION:   History of presenting illness-  69 y.o. female with PMH listed at below who presents to ER for worsening of abdominal pain, generalized weakness and shortness of breath.   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  Due to inability to go on anticoagulation, IVC filter was placed on 06/06/2024, and she is status post mesenteric angiography and a duodenal mass embolization.  Patient reports that since the procedure, she has experienced worsening of abdominal pain despite taking her home pain medication regimen.  She continues to have melena.  06/10/2024, CT angiogram abdomen pelvis with contrast 1. No acute aortic pathology. 2. Interval placement of infrarenal IVC filter. Filling defect within the right great saphenous vein immediately distal to the saphenofemoral femoral junction, which appears new compared to 06/05/2024, suspicious for thrombus. Filling defect within the left common femoral vein, consistent with known thrombus. 3. Unchanged partially imaged bilateral lower lobe pulmonary emboli. 4. Increased size of the heterogeneously enhancing mass within the right hemiabdomen centered at the proximal duodenum with findings suspicious for interval invasion of the proximal transverse colon resulting in marked luminal narrowing. Along the periphery of this mass, there are areas of irregular arterial  hyperenhancement, which expands on the venous phase image, favored to represent areas of progressive tumoral enhancement, although areas of active extravasation could have a similar appearance. 5. Bilobar hepatic metastases, many of which have increased in size. Increased size of right lower quadrant nodule and right pelvic sidewall nodule. 6. Unchanged right cardiophrenic and adrenal nodules and left para-aortic lymph node. 7. Unchanged linear 7 mm calculus at the left ureterovesical junction. No hydronephrosis. 8.  Aortic Atherosclerosis   In the ER, CBC also dropped to 6.5, status post PRBC transfusion. Oncology was consulted for further evaluation.  Allergies[1]  Patient Active Problem List   Diagnosis Date Noted   Malignant neoplasm of both ovaries (HCC) 04/19/2022    Priority: High   Melena 05/17/2024    Priority: Medium    Iron  deficiency anemia due to chronic blood loss 05/17/2024    Priority: Medium    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    Priority: Medium    RA (rheumatoid arthritis) [facility])     Priority: Medium    Palliative care encounter 12/13/2023    Priority: Low   Anxiety     Priority: Low   Hepatitis C     Priority: Low   Acute esophagitis 06/11/2024   Malignant neoplasm of ovary (HCC) 06/10/2024   Severe anemia 06/10/2024   Cancer associated pain 05/20/2024   COVID-19 virus infection 04/25/2024   Symptomatic anemia 04/24/2024   Anorexia 04/18/2024   Duodenal mass 03/31/2024   GI bleed 03/29/2024   Oral infection 10/18/2023   Pancytopenia, acquired (HCC) 08/03/2023   Mucositis due to chemotherapy 08/03/2023   Anemia due to antineoplastic chemotherapy 07/26/2023   Nausea without vomiting 07/12/2023   Rash and nonspecific skin eruption 07/12/2023   Genetic testing 10/16/2022  Port-A-Cath in place 09/15/2022   Leukopenia due to antineoplastic chemotherapy 07/17/2022   Bilateral leg edema 05/30/2022   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 05/30/2022   Chronic use of opiate drug for therapeutic purpose 05/30/2022   Constipation 05/30/2022   Ovarian mass, left 04/19/2022   Multiple sclerosis 04/19/2022   Lumbar stenosis with neurogenic claudication 06/02/2020   Weakness of both lower extremities 06/02/2020   Lumbosacral radiculopathy 06/02/2020   Chest pain 10/18/2017   Hypertension    Osteoarthritis    Palpitations 02/02/2016   SYNCOPE AND COLLAPSE 10/23/2008   FIBROIDS, UTERUS 10/13/2008   Anxiety state 10/13/2008   DEPRESSION 10/13/2008   Essential hypertension 10/13/2008   VENTRICULAR TACHYCARDIA 10/13/2008   UNSPECIFIED PAROXYSMAL TACHYCARDIA 10/13/2008   Asthma 10/13/2008   GERD 10/13/2008   Endometriosis 10/13/2008   Osteoarthritis 10/13/2008   TACHYCARDIA 10/13/2008     Past Medical History:  Diagnosis Date   Anxiety    Asthma    Back problem    disc disease, chronic low back pain, right leg pain, s/p discectomy 99   Cancer (HCC)    ovarian cancer   Depression    Diastolic dysfunction    with elevated LVEDP at cath   Endometriosis    Fibroids    hx of    GERD (gastroesophageal reflux disease)    Heart murmur    Hepatitis C    treated 8-9 years ago   Hidradenitis suppurativa    History of kidney stones    Hypertension    MS (multiple sclerosis)    Neuromuscular disorder (HCC)    Mulitple sclerosis   Osteoarthritis    Pre-diabetes    Prediabetes    RA (rheumatoid arthritis) (HCC)    Rotator cuff tear    right shoulder    Syncope      Past Surgical History:  Procedure Laterality Date   ANGIOPLASTY     BACK SURGERY     lumbar surgery x 2 done in [place]  and [place]   COLON RESECTION SIGMOID  08/16/2022   Procedure: COLON RESECTION SIGMOID;  Surgeon: Viktoria Comer SAUNDERS, MD;  Location: [facility];  Service: Gynecology;;   COLONOSCOPY  07/2010   tubular adenoma   COLONOSCOPY  08/2015   CYSTOSCOPY N/A 08/16/2022   Procedure: CYSTOSCOPY;   Surgeon: Viktoria Comer SAUNDERS, MD;  Location: [facility];  Service: Gynecology;  Laterality: N/A;   DISKECTOMY     ESOPHAGOGASTRODUODENOSCOPY N/A 03/30/2024   Procedure: EGD (ESOPHAGOGASTRODUODENOSCOPY);  Surgeon: Maryruth Ole DASEN, MD;  Location: [facility] ENDOSCOPY;  Service: Endoscopy;  Laterality: N/A;   HYSTERECTOMY ABDOMINAL WITH SALPINGO-OOPHORECTOMY  08/16/2022   Procedure: HYSTERECTOMY ABDOMINAL WITH SALPINGO-OOPHORECTOMY;  Surgeon: Viktoria Comer SAUNDERS, MD;  Location: [facility];  Service: Gynecology;;   IR ANGIOGRAM SELECTIVE EACH ADDITIONAL VESSEL  06/06/2024   IR ANGIOGRAM SELECTIVE EACH ADDITIONAL VESSEL  06/06/2024   IR ANGIOGRAM VISCERAL SELECTIVE  06/06/2024   IR EMBO TUMOR ORGAN ISCHEMIA INFARCT INC GUIDE ROADMAPPING  06/06/2024   IR IMAGING GUIDED PORT INSERTION  05/18/2022   IR IMAGING GUIDED PORT INSERTION  07/03/2023   IR IVC FILTER PLMT / S&I /IMG GUID/MOD SED  06/06/2024   IR RADIOLOGIST EVAL & MGMT  05/27/2024   IR REMOVAL TUN ACCESS W/ PORT W/O FL MOD SED  12/20/2022   IR US  GUIDE BX ASP/DRAIN  05/18/2022   IR US  GUIDE VASC ACCESS RIGHT  06/06/2024   LAPAROSCOPY N/A 08/16/2022  Procedure: LAPAROSCOPY DIAGNOSTIC;  Surgeon: Viktoria Comer SAUNDERS, MD;  Location: [facility];  Service: Gynecology;  Laterality: N/A;   laparoscopy for endometriosis     left shoulder scope     RADIOLOGY WITH ANESTHESIA N/A 05/18/2022   Procedure: IR WITH ANESTHESIA PORT AND BIOPSY;  Surgeon: Luverne Aran, MD;  Location: [facility];  Service: Radiology;  Laterality: N/A;   ROTATOR CUFF REPAIR Left    Spinal Fusion  12/2016   L4-S1    Social History   Socioeconomic History   Marital status: Single    Spouse name: Not on file   Number of children: 1   Years of education: Not on file   Highest education level: Not on file  Occupational History   Not on file  Tobacco Use   Smoking status: Some Days    Current packs/day: 0.00    Average packs/day: 0.1 packs/day for 27.0 years (2.7 ttl pk-yrs)    Types:  Cigarettes    Start date: 24    Last attempt to quit: 2023    Years since quitting: 3.0   Smokeless tobacco: Never  Vaping Use   Vaping status: Never Used  Substance and Sexual Activity   Alcohol use: Not Currently   Drug use: Yes    Types: Oxycodone    Sexual activity: Not Currently  Other Topics Concern   Not on file  Social History Narrative   Lives alone   Right handed   Caffeine: none    Social Drivers of Health   Tobacco Use: High Risk (06/10/2024)   Patient History    Smoking Tobacco Use: Some Days    Smokeless Tobacco Use: Never    Passive Exposure: Not on file  Financial Resource Strain: Not on file  Food Insecurity: No Food Insecurity (06/11/2024)   Epic    Worried About Programme Researcher, Broadcasting/film/video in the Last Year: Never true    Ran Out of Food in the Last Year: Never true  Transportation Needs: No Transportation Needs (06/11/2024)   Epic    Lack of Transportation (Medical): No    Lack of Transportation (Non-Medical): No  Physical Activity: Not on file  Stress: Not on file  Social Connections: Socially Isolated (06/11/2024)   Social Connection and Isolation Panel    Frequency of Communication with Friends and Family: Three times a week    Frequency of Social Gatherings with Friends and Family: Never    Attends Religious Services: Never    Database Administrator or Organizations: No    Attends Banker Meetings: Never    Marital Status: Divorced  Catering Manager Violence: Not At Risk (06/11/2024)   Epic    Fear of Current or Ex-Partner: No    Emotionally Abused: No    Physically Abused: No    Sexually Abused: No  Depression (PHQ2-9): Low Risk (05/28/2024)   Depression (PHQ2-9)    PHQ-2 Score: 0  Alcohol Screen: Not on file  Housing: Low Risk (06/11/2024)   Epic    Unable to Pay for Housing in the Last Year: No    Number of Times Moved in the Last Year: 0    Homeless in the Last Year: No  Utilities: Not At Risk (06/11/2024)   Epic    Threatened with loss  of utilities: No  Health Literacy: Not on file     Family History  Problem Relation Age of Onset   Heart disease Mother        Died with  MI 41, chest pain 30s   Hypertension Mother    Pancreatitis Father    Atrial fibrillation Sister    Breast cancer Maternal Aunt        dx <50   Stomach cancer Maternal Grandfather        mets to liver? dx after 50   Multiple sclerosis Cousin    Breast cancer Cousin        mat female cousin; dx unknown age   Colon cancer Cousin 55       mat female cousin; mets   Leukemia Cousin 14       mat female cousin    Current Medications[2]  Review of Systems - Oncology  PHYSICAL EXAM Vitals:   06/11/24 1226 06/11/24 1400 06/11/24 1633 06/11/24 2000  BP:  127/69 128/81 107/69  Pulse:  (!) 102 98 99  Resp:  18  18  Temp: 99 F (37.2 C)  98.9 F (37.2 C) 99.5 F (37.5 C)  TempSrc: Oral  Oral Oral  SpO2:  100% 100% 100%  Weight:      Height:       Physical Exam    LABORATORY STUDIES    Latest Ref Rng & Units 06/11/2024    6:02 AM 06/10/2024   12:37 PM 06/06/2024    7:52 AM  CBC  WBC 4.0 - 10.5 K/uL 7.6  8.6  7.2   Hemoglobin 12.0 - 15.0 g/dL 8.2  6.5  8.6   Hematocrit 36.0 - 46.0 % 26.0  20.2  26.5   Platelets 150 - 400 K/uL 259  255  188       Latest Ref Rng & Units 06/11/2024    4:43 AM 06/10/2024   12:37 PM 06/06/2024    7:52 AM  CMP  Glucose 70 - 99 mg/dL 891  873  869   BUN 8 - 23 mg/dL 10  12  18    Creatinine 0.44 - 1.00 mg/dL 9.32  9.41  9.17   Sodium 135 - 145 mmol/L 131  135  136   Potassium 3.5 - 5.1 mmol/L 4.7  3.0  3.5   Chloride 98 - 111 mmol/L 99  99  101   CO2 22 - 32 mmol/L 22  23  25    Calcium 8.9 - 10.3 mg/dL 7.6  7.9  8.2   Total Protein 6.5 - 8.1 g/dL  5.7    Total Bilirubin 0.0 - 1.2 mg/dL  0.6    Alkaline Phos 38 - 126 U/L  98    AST 15 - 41 U/L  31    ALT 0 - 44 U/L  13       RADIOGRAPHIC STUDIES: I have personally reviewed the radiological images as listed and agreed with the findings in the report. CT  Angio Abd/Pel W and/or Wo Contrast Result Date: 06/10/2024 CLINICAL DATA:  Metastatic ovarian cancer with worsening abdominal pain EXAM: CTA ABDOMEN AND PELVIS WITHOUT AND WITH CONTRAST TECHNIQUE: Multidetector CT imaging of the abdomen and pelvis was performed using the standard protocol during bolus administration of intravenous contrast. Multiplanar reconstructed images and MIPs were obtained and reviewed to evaluate the vascular anatomy. RADIATION DOSE REDUCTION: This exam was performed according to the departmental dose-optimization program which includes automated exposure control, adjustment of the mA and/or kV according to patient size and/or use of iterative reconstruction technique. CONTRAST:  OMNIPAQUE  IOHEXOL  350 MG/ML SOLN COMPARISON:  CTA chest dated 06/06/2024, CTA abdomen and pelvis dated 06/05/2024 and multiple priors FINDINGS:  VASCULAR Aorta: Normal caliber aorta without aneurysm, dissection, vasculitis or significant stenosis. Mild aortic atherosclerosis. Celiac: Patent without evidence of aneurysm, dissection, vasculitis or significant stenosis. Prior gastroduodenal artery embolization. SMA: Patent without evidence of aneurysm, dissection, vasculitis or significant stenosis. Renals: Single bilateral renal arteries. Both renal arteries are patent without evidence of aneurysm, dissection, vasculitis, fibromuscular dysplasia or significant stenosis. IMA: Patent without evidence of aneurysm, dissection, vasculitis or significant stenosis. Inflow: Mild narrowing of the bilateral internal iliac artery origins due to atherosclerotic plaque. Otherwise patent without evidence of aneurysm, dissection, vasculitis or significant stenosis. Proximal Outflow: Bilateral common femoral and visualized portions of the superficial and profunda femoral arteries are patent without evidence of aneurysm, dissection, vasculitis or significant stenosis. Veins: Interval placement of infrarenal IVC filter. Focal  filling defect within the right great saphenous vein immediately distal to the saphenofemoral junction (16:197), which appears new compared to 06/05/2024. Filling defect within the left common femoral vein (16:210). Review of the MIP images confirms the above findings. NON-VASCULAR Lower chest: No focal consolidation or pulmonary nodule in the lung bases. No pleural effusion or pneumothorax demonstrated. Partially imaged heart size is normal. Unchanged partially imaged bilateral lower lobe pulmonary emboli. Unchanged 3.4 x 2.5 cm right cardiophrenic nodule (16: 14). Hepatobiliary: Again seen are heterogeneously enhancing bilobar hepatic masses. Index lesions: -3.8 x 2.8 cm segment 7/8 (16:38) unchanged when remeasured -9.5 x 6.2 cm segment 4 (16:29), previously 8.7 x 6.6 cm -6.2 x 4.5 cm segment 2 (16:27), previously 5.4 x 4.2 cm No intra or extrahepatic biliary ductal dilation. Gallbladder is laterally displaced. Mild gallbladder mural thickening and pericholecystic free fluid. Pancreas: No focal lesions or main ductal dilation. Spleen: Normal in size without focal abnormality. Adrenals/Urinary Tract: Unchanged right adrenal nodule size measuring 2.9 x 2.1 cm (16:51) when remeasured. Nodule measures 23 HU precontrast. No left adrenal nodule. Duplex kidneys. No suspicious renal mass or hydronephrosis. Unchanged linear 7 mm calculus at the left ureterovesical junction (16:171). No focal bladder wall thickening. Stomach/Bowel: Normal appearance of the stomach. Again seen is heterogeneously enhancing mass within the right hemiabdomen centered at the proximal duodenum measuring 11.0 x 8.6 cm (16:78), previously 9.8 x 9.2 cm when measured similarly. The mass continues exerts local mass effect with lateral displacement of the gallbladder and continued effacement of the SMV portal confluence. Findings suspicious for interval invasion of the proximal transverse colon with marked luminal narrowing. Along the periphery of  this mass, there are areas of irregular arterial hyperenhancement, for example posteroinferior (9:110), which expands on the venous phase image (16:107). Appendix is not discretely seen. Lymphatic: Slightly increased size of right pelvic sidewall nodule measuring 1.6 x 1.4 cm (16:152), previously 1.5 x 1.2 cm. Unchanged 1.0 cm left para-aortic lymph node (16:86). Increased size of right lower quadrant nodule measuring 3.5 x 2.6 cm (16:130), previously 3 1 x 2.5 cm (remeasured). Reproductive: Status post hysterectomy and bilateral salpingo oophorectomy. Unchanged 2.0 x 1.8 cm nodular soft tissue focus, inseparable from the right vaginal cuff (16:163). Other: Trace right hemi abdominal free fluid. No fluid collection. Asymmetric thickening of the right anterior peritoneum adjacent to the duodenal mass. Musculoskeletal: No acute or abnormal lytic or blastic osseous lesions. Postsurgical changes of L4-S1 spinal fusion. Hardware appears intact. Subcutaneous soft tissue stranding and small focus of hyperdensity in the right gluteal region, likely postprocedural. Mild body wall edema. Small fat-containing paraumbilical hernia. IMPRESSION: 1. No acute aortic pathology. 2. Interval placement of infrarenal IVC filter. Filling defect within the right great saphenous  vein immediately distal to the saphenofemoral femoral junction, which appears new compared to 06/05/2024, suspicious for thrombus. Filling defect within the left common femoral vein, consistent with known thrombus. 3. Unchanged partially imaged bilateral lower lobe pulmonary emboli. 4. Increased size of the heterogeneously enhancing mass within the right hemiabdomen centered at the proximal duodenum with findings suspicious for interval invasion of the proximal transverse colon resulting in marked luminal narrowing. Along the periphery of this mass, there are areas of irregular arterial hyperenhancement, which expands on the venous phase image, favored to represent  areas of progressive tumoral enhancement, although areas of active extravasation could have a similar appearance. 5. Bilobar hepatic metastases, many of which have increased in size. Increased size of right lower quadrant nodule and right pelvic sidewall nodule. 6. Unchanged right cardiophrenic and adrenal nodules and left para-aortic lymph node. 7. Unchanged linear 7 mm calculus at the left ureterovesical junction. No hydronephrosis. 8.  Aortic Atherosclerosis (ICD10-I70.0). Electronically Signed   By: Limin  Xu M.D.   On: 06/10/2024 15:48   DG Chest Portable 1 View Result Date: 06/10/2024 CLINICAL DATA:  Shortness of breath EXAM: PORTABLE CHEST 1 VIEW COMPARISON:  June 04, 2024 FINDINGS: The heart size and mediastinal contours are within normal limits. Right internal jugular Port-A-Cath is unchanged. Both lungs are clear. The visualized skeletal structures are unremarkable. IMPRESSION: No active disease. Electronically Signed   By: Lynwood Landy Raddle M.D.   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 EXAM: Title; COIL EMBOLIZATION OF GASTRODUODENAL ARTERY Listed procedures; 1.  ULTRASOUND-GUIDED RIGHT COMMON FEMORAL ARTERY ACCESS 2. MESENTERIC ARTERIOGRAPHY, including CELIAC, SUPERIOR MESENTERIC ARTERY and GASTRODUODENAL ARTERIOGRAMS 3. COIL EMBOLIZATION of the GASTRODUODENAL ARTERY 4. GELFOAM EMBOLIZATION of SUPERIOR MESENTERIC ARTERY BRANCHES COMPARISON:  CT AP, 06/05/2024. MEDICATIONS: 2 g cefoxitin  IV. The antibiotics were administered within 60 minutes of procedural initiation. 4 mg Zofran  IV.  50 mg albumin  IV ANESTHESIA/SEDATION: Moderate (conscious) sedation was employed during this procedure. A total of Versed  5 mg and Fentanyl  250 mcg was administered intravenously. Moderate Sedation Time: 90 minutes. The patient's level of  consciousness and vital signs were monitored continuously by radiology nursing throughout the procedure under my direct supervision. CONTRAST:  140 mL Omnipaque  300 FLUOROSCOPY: Radiation Exposure Index and estimated peak skin dose (PSD); Reference air kerma (RAK), 429.3 mGy. COMPLICATIONS: None immediate. PROCEDURE: Informed consent was obtained from the patient and/or patient's representative following explanation of the procedure, risks, benefits and alternatives. All questions were addressed. A time out was performed prior to the initiation of the procedure. Maximal barrier sterile technique utilized including caps, mask, sterile gowns, sterile gloves, large sterile drape, hand hygiene, and chlorhexidine  prep. The RIGHT femoral head was marked fluoroscopically. Under sterile conditions and local anesthesia, the RIGHT common femoral artery access was performed with a micropuncture needle. Under direct ultrasound guidance, the RIGHT common femoral was accessed with a micropuncture kit. An ultrasound image was saved for documentation purposes. This allowed for placement of a 6 Fr 35 cm vascular sheath. A limited arteriogram was performed through the side arm of the sheath confirming appropriate access within the RIGHT common femoral artery. A limited abdominal aortogram was performed to help identify the celiac artery origin. Over a Bentson wire, a C2 catheter was advanced, back bled and flushed. The catheter was then utilized to select the celiac access, then advanced into  the common hepatic then gastroduodenal arteries. Selective mesenteric arteriograms were performed at each level. Using a 2.8 Fr Progreat microcatheter and 0.016 inch Fathom microwire access into the gastroduodenal artery was performed and a selective arteriogram was performed. Selective embolization with multiple 0.018 inch micro coils was performed. The microcatheter was removed and arteriogram with the 5 Fr catheter at the common hepatic  artery was performed. Adequate pruning of the GDA was achieved on post embolization arteriogram. The C2 catheter was replaced for a Sos catheter, which was then used to select the superior mesenteric artery. Additional selective arteriogram was performed and access into the branches of the SMA, including the inferior pancreaticoduodenal and middle colic artery was obtained with the microcatheter. Gel-Foam embolization was then performed to near-stasis. Post embolization arteriogram demonstrated adequate pruning of the SMA branches supplying the tumor. Images were reviewed and the procedure was terminated. All wires, catheters and sheaths were removed from the patient. Hemostasis was achieved at the RIGHT groin access site with Angio-Seal closure. The patient tolerated the procedure well without immediate post procedural complication. FINDINGS: *Celiac and superior mesenteric arteriograms with normal order and branching. *Multivessel arterial supply to the large duodenal tumor, from the GDA and branches off the SMA, including the inferior pancreaticoduodenal and middle colic arteries. Tumor blush was identified, however no active extravasation was noted. *Coil embolization of the GDA, with additional Gelfoam embolization of the aforementioned SMA branches, with adequate pruning of the vasculature. IMPRESSION: 1. Mesenteric arteriography for duodenal mass, with arterial supply from the celiac axis at the GDA, and the SMA at the inferior pancreaticoduodenal and middle colic arteries. Tumor blush without evidence of active extravasation from the duodenal mass. 2. Successful coil embolization of the gastroduodenal artery, and Gelfoam embolization of the SMA branches without significant residual tumoral enhancement. Thom Hall, MD [facility] Electronically Signed   By: Thom Hall M.D.   On: 06/09/2024 14:52   IR Angiogram Visceral Selective Result Date:  06/09/2024 INDICATION: duodenal mass - bleeding Briefly, 69 year old female with a history of metastatic ovarian cancer and bleeding duodenal metastasis. EXAM: Title; COIL EMBOLIZATION OF GASTRODUODENAL ARTERY Listed procedures; 1.  ULTRASOUND-GUIDED RIGHT COMMON FEMORAL ARTERY ACCESS 2. MESENTERIC ARTERIOGRAPHY, including CELIAC, SUPERIOR MESENTERIC ARTERY and GASTRODUODENAL ARTERIOGRAMS 3. COIL EMBOLIZATION of the GASTRODUODENAL ARTERY 4. GELFOAM EMBOLIZATION of SUPERIOR MESENTERIC ARTERY BRANCHES COMPARISON:  CT AP, 06/05/2024. MEDICATIONS: 2 g cefoxitin  IV. The antibiotics were administered within 60 minutes of procedural initiation. 4 mg Zofran  IV.  50 mg albumin  IV ANESTHESIA/SEDATION: Moderate (conscious) sedation was employed during this procedure. A total of Versed  5 mg and Fentanyl  250 mcg was administered intravenously. Moderate Sedation Time: 90 minutes. The patient's level of consciousness and vital signs were monitored continuously by radiology nursing throughout the procedure under my direct supervision. CONTRAST:  140 mL Omnipaque  300 FLUOROSCOPY: Radiation Exposure Index and estimated peak skin dose (PSD); Reference air kerma (RAK), 429.3 mGy. COMPLICATIONS: None immediate. PROCEDURE: Informed consent was obtained from the patient and/or patient's representative following explanation of the procedure, risks, benefits and alternatives. All questions were addressed. A time out was performed prior to the initiation of the procedure. Maximal barrier sterile technique utilized including caps, mask, sterile gowns, sterile gloves, large sterile drape, hand hygiene, and chlorhexidine  prep. The RIGHT femoral head was marked fluoroscopically. Under sterile conditions and local anesthesia, the RIGHT common femoral artery access was performed with a micropuncture needle. Under direct ultrasound guidance, the RIGHT common femoral was accessed with a micropuncture  kit. An ultrasound image was saved for  documentation purposes. This allowed for placement of a 6 Fr 35 cm vascular sheath. A limited arteriogram was performed through the side arm of the sheath confirming appropriate access within the RIGHT common femoral artery. A limited abdominal aortogram was performed to help identify the celiac artery origin. Over a Bentson wire, a C2 catheter was advanced, back bled and flushed. The catheter was then utilized to select the celiac access, then advanced into the common hepatic then gastroduodenal arteries. Selective mesenteric arteriograms were performed at each level. Using a 2.8 Fr Progreat microcatheter and 0.016 inch Fathom microwire access into the gastroduodenal artery was performed and a selective arteriogram was performed. Selective embolization with multiple 0.018 inch micro coils was performed. The microcatheter was removed and arteriogram with the 5 Fr catheter at the common hepatic artery was performed. Adequate pruning of the GDA was achieved on post embolization arteriogram. The C2 catheter was replaced for a Sos catheter, which was then used to select the superior mesenteric artery. Additional selective arteriogram was performed and access into the branches of the SMA, including the inferior pancreaticoduodenal and middle colic artery was obtained with the microcatheter. Gel-Foam embolization was then performed to near-stasis. Post embolization arteriogram demonstrated adequate pruning of the SMA branches supplying the tumor. Images were reviewed and the procedure was terminated. All wires, catheters and sheaths were removed from the patient. Hemostasis was achieved at the RIGHT groin access site with Angio-Seal closure. The patient tolerated the procedure well without immediate post procedural complication. FINDINGS: *Celiac and superior mesenteric arteriograms with normal order and branching. *Multivessel arterial supply to the large duodenal tumor, from the GDA and branches off the SMA, including  the inferior pancreaticoduodenal and middle colic arteries. Tumor blush was identified, however no active extravasation was noted. *Coil embolization of the GDA, with additional Gelfoam embolization of the aforementioned SMA branches, with adequate pruning of the vasculature. IMPRESSION: 1. Mesenteric arteriography for duodenal mass, with arterial supply from the celiac axis at the GDA, and the SMA at the inferior pancreaticoduodenal and middle colic arteries. Tumor blush without evidence of active extravasation from the duodenal mass. 2. Successful coil embolization of the gastroduodenal artery, and Gelfoam embolization of the SMA branches without significant residual tumoral enhancement. Thom Hall, MD [facility] Radiology Electronically Signed   By: Thom Hall M.D.   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 EXAM: Title; COIL EMBOLIZATION OF GASTRODUODENAL ARTERY Listed procedures; 1.  ULTRASOUND-GUIDED RIGHT COMMON FEMORAL ARTERY ACCESS 2. MESENTERIC ARTERIOGRAPHY, including CELIAC, SUPERIOR MESENTERIC ARTERY and GASTRODUODENAL ARTERIOGRAMS 3. COIL EMBOLIZATION of the GASTRODUODENAL ARTERY 4. GELFOAM EMBOLIZATION of SUPERIOR MESENTERIC ARTERY BRANCHES COMPARISON:  CT AP, 06/05/2024. MEDICATIONS: 2 g cefoxitin  IV. The antibiotics were administered within 60 minutes of procedural initiation. 4 mg Zofran  IV.  50 mg albumin  IV ANESTHESIA/SEDATION: Moderate (conscious) sedation was employed during this procedure. A total of Versed  5 mg and Fentanyl  250 mcg was administered intravenously. Moderate Sedation Time: 90 minutes. The patient's level of consciousness and vital signs were monitored continuously by radiology nursing throughout the procedure under my direct  supervision. CONTRAST:  140 mL Omnipaque  300 FLUOROSCOPY: Radiation Exposure Index and estimated peak skin dose (PSD); Reference air kerma (RAK), 429.3 mGy. COMPLICATIONS: None immediate. PROCEDURE: Informed consent was obtained from the patient and/or patient's  representative following explanation of the procedure, risks, benefits and alternatives. All questions were addressed. A time out was performed prior to the initiation of the procedure. Maximal barrier sterile technique utilized including caps, mask, sterile gowns, sterile gloves, large sterile drape, hand hygiene, and chlorhexidine  prep. The RIGHT femoral head was marked fluoroscopically. Under sterile conditions and local anesthesia, the RIGHT common femoral artery access was performed with a micropuncture needle. Under direct ultrasound guidance, the RIGHT common femoral was accessed with a micropuncture kit. An ultrasound image was saved for documentation purposes. This allowed for placement of a 6 Fr 35 cm vascular sheath. A limited arteriogram was performed through the side arm of the sheath confirming appropriate access within the RIGHT common femoral artery. A limited abdominal aortogram was performed to help identify the celiac artery origin. Over a Bentson wire, a C2 catheter was advanced, back bled and flushed. The catheter was then utilized to select the celiac access, then advanced into the common hepatic then gastroduodenal arteries. Selective mesenteric arteriograms were performed at each level. Using a 2.8 Fr Progreat microcatheter and 0.016 inch Fathom microwire access into the gastroduodenal artery was performed and a selective arteriogram was performed. Selective embolization with multiple 0.018 inch micro coils was performed. The microcatheter was removed and arteriogram with the 5 Fr catheter at the common hepatic artery was performed. Adequate pruning of the GDA was achieved on post embolization arteriogram. The C2 catheter was replaced  for a Sos catheter, which was then used to select the superior mesenteric artery. Additional selective arteriogram was performed and access into the branches of the SMA, including the inferior pancreaticoduodenal and middle colic artery was obtained with the microcatheter. Gel-Foam embolization was then performed to near-stasis. Post embolization arteriogram demonstrated adequate pruning of the SMA branches supplying the tumor. Images were reviewed and the procedure was terminated. All wires, catheters and sheaths were removed from the patient. Hemostasis was achieved at the RIGHT groin access site with Angio-Seal closure. The patient tolerated the procedure well without immediate post procedural complication. FINDINGS: *Celiac and superior mesenteric arteriograms with normal order and branching. *Multivessel arterial supply to the large duodenal tumor, from the GDA and branches off the SMA, including the inferior pancreaticoduodenal and middle colic arteries. Tumor blush was identified, however no active extravasation was noted. *Coil embolization of the GDA, with additional Gelfoam embolization of the aforementioned SMA branches, with adequate pruning of the vasculature. IMPRESSION: 1. Mesenteric arteriography for duodenal mass, with arterial supply from the celiac axis at the GDA, and the SMA at the inferior pancreaticoduodenal and middle colic arteries. Tumor blush without evidence of active extravasation from the duodenal mass. 2. Successful coil embolization of the gastroduodenal artery, and Gelfoam embolization of the SMA branches without significant residual tumoral enhancement. Thom Hall, MD [facility] Radiology Electronically Signed   By: Thom Hall M.D.   On: 06/09/2024 14:52   IR Angiogram Selective Each Additional Vessel Result Date: 06/09/2024 INDICATION: duodenal mass - bleeding Briefly, 69 year old female with a history of metastatic ovarian  cancer and bleeding duodenal metastasis. EXAM: Title; COIL EMBOLIZATION OF GASTRODUODENAL ARTERY Listed procedures; 1.  ULTRASOUND-GUIDED RIGHT COMMON FEMORAL ARTERY ACCESS 2. MESENTERIC ARTERIOGRAPHY, including CELIAC, SUPERIOR MESENTERIC ARTERY and GASTRODUODENAL ARTERIOGRAMS 3. COIL EMBOLIZATION of the GASTRODUODENAL ARTERY 4. GELFOAM EMBOLIZATION of SUPERIOR MESENTERIC ARTERY BRANCHES COMPARISON:  CT AP, 06/05/2024. MEDICATIONS: 2 g cefoxitin  IV. The antibiotics were administered within 60 minutes of procedural initiation. 4 mg Zofran  IV.  50 mg albumin  IV  ANESTHESIA/SEDATION: Moderate (conscious) sedation was employed during this procedure. A total of Versed  5 mg and Fentanyl  250 mcg was administered intravenously. Moderate Sedation Time: 90 minutes. The patient's level of consciousness and vital signs were monitored continuously by radiology nursing throughout the procedure under my direct supervision. CONTRAST:  140 mL Omnipaque  300 FLUOROSCOPY: Radiation Exposure Index and estimated peak skin dose (PSD); Reference air kerma (RAK), 429.3 mGy. COMPLICATIONS: None immediate. PROCEDURE: Informed consent was obtained from the patient and/or patient's representative following explanation of the procedure, risks, benefits and alternatives. All questions were addressed. A time out was performed prior to the initiation of the procedure. Maximal barrier sterile technique utilized including caps, mask, sterile gowns, sterile gloves, large sterile drape, hand hygiene, and chlorhexidine  prep. The RIGHT femoral head was marked fluoroscopically. Under sterile conditions and local anesthesia, the RIGHT common femoral artery access was performed with a micropuncture needle. Under direct ultrasound guidance, the RIGHT common femoral was accessed with a micropuncture kit. An ultrasound image was saved for documentation purposes. This allowed for placement of a 6 Fr 35 cm vascular sheath. A limited arteriogram was performed  through the side arm of the sheath confirming appropriate access within the RIGHT common femoral artery. A limited abdominal aortogram was performed to help identify the celiac artery origin. Over a Bentson wire, a C2 catheter was advanced, back bled and flushed. The catheter was then utilized to select the celiac access, then advanced into the common hepatic then gastroduodenal arteries. Selective mesenteric arteriograms were performed at each level. Using a 2.8 Fr Progreat microcatheter and 0.016 inch Fathom microwire access into the gastroduodenal artery was performed and a selective arteriogram was performed. Selective embolization with multiple 0.018 inch micro coils was performed. The microcatheter was removed and arteriogram with the 5 Fr catheter at the common hepatic artery was performed. Adequate pruning of the GDA was achieved on post embolization arteriogram. The C2 catheter was replaced for a Sos catheter, which was then used to select the superior mesenteric artery. Additional selective arteriogram was performed and access into the branches of the SMA, including the inferior pancreaticoduodenal and middle colic artery was obtained with the microcatheter. Gel-Foam embolization was then performed to near-stasis. Post embolization arteriogram demonstrated adequate pruning of the SMA branches supplying the tumor. Images were reviewed and the procedure was terminated. All wires, catheters and sheaths were removed from the patient. Hemostasis was achieved at the RIGHT groin access site with Angio-Seal closure. The patient tolerated the procedure well without immediate post procedural complication. FINDINGS: *Celiac and superior mesenteric arteriograms with normal order and branching. *Multivessel arterial supply to the large duodenal tumor, from the GDA and branches off the SMA, including the inferior pancreaticoduodenal and middle colic arteries. Tumor blush was identified, however no active extravasation  was noted. *Coil embolization of the GDA, with additional Gelfoam embolization of the aforementioned SMA branches, with adequate pruning of the vasculature. IMPRESSION: 1. Mesenteric arteriography for duodenal mass, with arterial supply from the celiac axis at the GDA, and the SMA at the inferior pancreaticoduodenal and middle colic arteries. Tumor blush without evidence of active extravasation from the duodenal mass. 2. Successful coil embolization of the gastroduodenal artery, and Gelfoam embolization of the SMA branches without significant residual tumoral enhancement. Thom Hall, MD [facility] Radiology Electronically Signed   By: Thom Hall M.D.   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 EXAM: Title; COIL EMBOLIZATION OF GASTRODUODENAL ARTERY Listed procedures; 1.  ULTRASOUND-GUIDED RIGHT COMMON FEMORAL ARTERY ACCESS 2. MESENTERIC ARTERIOGRAPHY, including CELIAC, SUPERIOR MESENTERIC ARTERY and GASTRODUODENAL ARTERIOGRAMS 3. COIL EMBOLIZATION of the GASTRODUODENAL ARTERY 4. GELFOAM EMBOLIZATION of SUPERIOR MESENTERIC ARTERY BRANCHES COMPARISON:  CT AP, 06/05/2024. MEDICATIONS: 2 g cefoxitin  IV. The antibiotics were administered within 60 minutes of procedural initiation. 4 mg Zofran  IV.  50 mg albumin  IV ANESTHESIA/SEDATION: Moderate (conscious) sedation was employed during this procedure. A total of Versed  5 mg and Fentanyl  250 mcg was administered intravenously. Moderate Sedation Time: 90 minutes. The patient's level of consciousness and vital signs were monitored continuously by radiology nursing throughout the procedure under my direct supervision. CONTRAST:  140 mL Omnipaque  300 FLUOROSCOPY: Radiation Exposure Index and estimated peak skin dose  (PSD); Reference air kerma (RAK), 429.3 mGy. COMPLICATIONS: None immediate. PROCEDURE: Informed consent was obtained from the patient and/or patient's representative following explanation of the procedure, risks, benefits and alternatives. All questions were addressed. A time out was performed prior to the initiation of the procedure. Maximal barrier sterile technique utilized including caps, mask, sterile gowns, sterile gloves, large sterile drape, hand hygiene, and chlorhexidine  prep. The RIGHT femoral head was marked fluoroscopically. Under sterile conditions and local anesthesia, the RIGHT common femoral artery access was performed with a micropuncture needle. Under direct ultrasound guidance, the RIGHT common femoral was accessed with a micropuncture kit. An ultrasound image was saved for documentation purposes. This allowed for placement of a 6 Fr 35 cm vascular sheath. A limited arteriogram was performed through the side arm of the sheath confirming appropriate access within the RIGHT common femoral artery. A limited abdominal aortogram was performed to help identify the celiac artery origin. Over a Bentson wire, a C2 catheter was advanced, back bled and flushed. The catheter was then utilized to select the celiac access, then advanced into the common hepatic then gastroduodenal arteries. Selective mesenteric arteriograms were performed at each level. Using a 2.8 Fr Progreat microcatheter and 0.016 inch Fathom microwire access into the gastroduodenal artery was performed and a selective arteriogram was performed. Selective embolization with multiple 0.018 inch micro coils was performed. The microcatheter was removed and arteriogram with the 5 Fr catheter at the common hepatic artery was performed. Adequate pruning of the GDA was achieved on post embolization arteriogram. The C2 catheter was replaced for a Sos catheter, which was then used to select the superior mesenteric artery. Additional selective  arteriogram was performed and access into the branches of the SMA, including the inferior pancreaticoduodenal and middle colic artery was obtained with the microcatheter. Gel-Foam embolization was then performed to near-stasis. Post embolization arteriogram demonstrated adequate pruning of the SMA branches supplying the tumor. Images were reviewed and the procedure was terminated. All wires, catheters and sheaths were removed from the patient. Hemostasis was achieved at the RIGHT groin access site with Angio-Seal closure. The patient tolerated the procedure well without immediate post procedural complication. FINDINGS: *Celiac and superior mesenteric arteriograms with normal order and branching. *Multivessel arterial supply to the large duodenal tumor, from the GDA and branches off the SMA, including the inferior pancreaticoduodenal and middle colic arteries. Tumor blush was identified, however no active extravasation was noted. *Coil embolization of the GDA, with additional Gelfoam embolization of the aforementioned SMA branches, with adequate pruning of the vasculature. IMPRESSION: 1. Mesenteric arteriography for duodenal mass, with arterial supply from the celiac axis at the  GDA, and the SMA at the inferior pancreaticoduodenal and middle colic arteries. Tumor blush without evidence of active extravasation from the duodenal mass. 2. Successful coil embolization of the gastroduodenal artery, and Gelfoam embolization of the SMA branches without significant residual tumoral enhancement. Thom Hall, MD [facility] Radiology Electronically Signed   By: Thom Hall M.D.   On: 06/09/2024 14:52   VAS US  LOWER EXTREMITY VENOUS (DVT) Result Date: 06/09/2024  Lower Venous DVT Study Patient Name:  VELMER BROADFOOT  Date of Exam:   06/06/2024 Medical Rec #: [id number]        Accession #:    [id number] Date of Birth: 06-Mar-1956       Patient Gender: F Patient Age:   12 years  Exam Location:  [facility] Procedure:      VAS US  LOWER EXTREMITY VENOUS (DVT) Referring Phys: THOM HALL --------------------------------------------------------------------------------  Indications: Incidental pulmonary embolism on abdominal CTA 06/05/24 Other Indications: Status post mesenteric arteriography and duodenal mass                    embolization with IVC filter placement 06/06/24. 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 Limitations: Bandage right groin. Comparison Study: Prior negative left LEV done 05/29/22 Performing Technologist: Alberta Lis RVS  Examination Guidelines: A complete evaluation includes B-mode imaging, spectral Doppler, color Doppler, and power Doppler as needed of all accessible portions of each vessel. Bilateral testing is considered an integral part of a complete examination. Limited examinations for reoccurring indications may be performed as noted. The reflux portion of the exam is performed with the patient in reverse Trendelenburg.  +---------+---------------+---------+-----------+----------+--------------+ RIGHT    CompressibilityPhasicitySpontaneityPropertiesThrombus Aging +---------+---------------+---------+-----------+----------+--------------+ CFV      Full           Yes      No                                  +---------+---------------+---------+-----------+----------+--------------+ SFJ      Full                                                        +---------+---------------+---------+-----------+----------+--------------+ FV Prox  Full           Yes      No                                  +---------+---------------+---------+-----------+----------+--------------+ FV Mid   Full                                                        +---------+---------------+---------+-----------+----------+--------------+ FV DistalFull                                                         +---------+---------------+---------+-----------+----------+--------------+ PFV      Full  Yes      No                                  +---------+---------------+---------+-----------+----------+--------------+ POP      Full           Yes      No                                  +---------+---------------+---------+-----------+----------+--------------+ PTV      Full                                                        +---------+---------------+---------+-----------+----------+--------------+ PERO     Full                                                        +---------+---------------+---------+-----------+----------+--------------+   +---------+---------------+---------+-----------+----------+--------------+ LEFT     CompressibilityPhasicitySpontaneityPropertiesThrombus Aging +---------+---------------+---------+-----------+----------+--------------+ CFV      Full           Yes      No                                  +---------+---------------+---------+-----------+----------+--------------+ SFJ      Full                                                        +---------+---------------+---------+-----------+----------+--------------+ FV Prox  None           No       No                   Acute          +---------+---------------+---------+-----------+----------+--------------+ FV Mid   None           No       No                   Acute          +---------+---------------+---------+-----------+----------+--------------+ FV DistalNone           No       No                   Acute          +---------+---------------+---------+-----------+----------+--------------+ PFV      Full           Yes      No                                  +---------+---------------+---------+-----------+----------+--------------+ POP      None           No       No  Acute           +---------+---------------+---------+-----------+----------+--------------+ PTV      None                                         Acute          +---------+---------------+---------+-----------+----------+--------------+ PERO     None                                         Acute          +---------+---------------+---------+-----------+----------+--------------+     Summary: RIGHT: - There is no evidence of deep vein thrombosis in the lower extremity.  - No cystic structure found in the popliteal fossa.  LEFT: - Findings consistent with acute deep vein thrombosis involving the left femoral vein, left popliteal vein, left posterior tibial veins, and left peroneal veins.  - No cystic structure found in the popliteal fossa.  *See table(s) above for measurements and observations. Electronically signed by Gaile New MD on 06/09/2024 at 10:45:02 AM.    Final    IR IVC FILTER PLMT / S&I PORTER GUID/MOD SED Result Date: 06/07/2024 INDICATION: duodenal mass Briefly, 69 year old female with a history of metastatic ovarian cancer, bleeding duodenal metastasis, and new, incidental PE and LEFT lower extremity DVT. Unable to anticoagulate. EXAM: Title; INFERIOR VENA CAVA (IVC) FILTER PLACEMENT Listed procedures; 1. ULTRASOUND-GUIDED RIGHT GREATER SAPHENOUS VEIN ACCESS 2. CENTRAL VENOGRAM 3. INFERIOR VENA CAVA FILTER PLACEMENT MEDICATIONS: None. ANESTHESIA/SEDATION: Moderate (conscious) sedation was employed during this procedure. A total of Versed  1 mg and Fentanyl  50 mcg was administered intravenously. Moderate Sedation Time: 18 minutes. The patient's level of consciousness and vital signs were monitored continuously by radiology nursing throughout the procedure under my direct supervision. CONTRAST:  60 mL Omnipaque  300 FLUOROSCOPY: Radiation Exposure Index and estimated peak skin dose (PSD); Reference air kerma (RAK), 111.9 mGy. COMPLICATIONS: None immediate. PROCEDURE: Informed written consent was obtained  from the patient and/or patient's representative following explanation of the procedure, risks, benefits and alternatives. A time out was performed prior to the initiation of the procedure. Maximal barrier sterile technique utilized including caps, mask, sterile gowns, sterile gloves, large sterile drape, hand hygiene, and sterile prep. Under sterile condition and local anesthesia, RIGHT greater saphenous vein access was performed with ultrasound. An ultrasound image was saved and sent to PACS. Over a guidewire, the IVC filter delivery sheath and inner dilator were advanced into the IVC just above the IVC bifurcation. Contrast injection was performed for an IVC venogram. Through the delivery sheath, a retrievable Denali IVC filter was deployed below the level of the renal veins and above the IVC bifurcation. Limited post deployment venacavagram was performed. The delivery sheath was removed and hemostasis was obtained with manual compression. A dressing was placed. The patient tolerated the procedure well without immediate post procedural complication. FINDINGS: The IVC is patent. No evidence of thrombus, stenosis, or occlusion. No variant venous anatomy. IMPRESSION: Successful placement of a retrievable infrarenal inferior vena cava (IVC) filter PLAN: IVC filters can cause complications when left in place for extended periods of time. If medically appropriate, recommend discontinuing filter prior to discharge. Please re-evaluate the patient for filter discontinuation when they are seen in follow up, and refer patient to Interventional Radiology for removal. Thom Hall, MD [facility] Radiology Electronically Signed   By: Thom Hall M.D.   On: 06/07/2024 07:44   CT Angio Chest Pulmonary Embolism (PE) W or WO Contrast Result Date: 06/06/2024 CLINICAL DATA:  Acute pulmonary embolism, unspecified pulmonary embolism type, unspecified whether acute cor pulmonale  present. Pulmonary embolism seen on CTA from 06/05/2024. EXAM: CT ANGIOGRAPHY CHEST WITH CONTRAST TECHNIQUE: Multidetector CT imaging of the chest was performed using the standard protocol during bolus administration of intravenous contrast. Multiplanar CT image reconstructions and MIPs were obtained to evaluate the vascular anatomy. RADIATION DOSE REDUCTION: This exam was performed according to the departmental dose-optimization program which includes automated exposure control, adjustment of the mA and/or kV according to patient size and/or use of iterative reconstruction technique. CONTRAST:  75mL OMNIPAQUE  IOHEXOL  350 MG/ML SOLN COMPARISON:  CTA abdomen pelvis 06/05/2024 FINDINGS: Cardiovascular: Positive for bilateral pulmonary emboli. Moderate clot burden with predominantly nonocclusive clot bilaterally. There is a small amount of clot in the distal right pulmonary artery extending into the truncus anterior and right interlobar artery. Clot extending into right segmental pulmonary arteries. Clot in the left lobar and segmental pulmonary arteries. No evidence for right heart strain. Normal caliber of the thoracic aorta. Arch great vessels are patent. Heart size is normal. No significant pericardial effusion. Embolization coils involving the gastroduodenal artery. Mediastinum/Nodes: Again noted is a soft tissue mass in the right anterior cardiophrenic fat measuring up to 3.4 cm. No significant mediastinal or hilar lymph node enlargement. No axillary lymph enlargement. Lungs/Pleura: Trachea and mainstem bronchi are patent. No pleural effusions. Small parenchymal densities or airspace densities in the medial right upper lobe on image 51, sequence 7 are new since 2023. Findings could be associated with infarct versus small area of airspace disease. 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 These findings are poorly characterized on this chest CT. Right  adrenal nodule. Musculoskeletal: No acute bone abnormality. Review of the MIP images confirms the above findings. IMPRESSION: 1. Positive for bilateral pulmonary emboli with moderate clot burden. Pulmonary emboli are predominantly nonocclusive and findings could be subacute. No evidence for right heart strain. 2. Parenchymal densities in the medial right lung could represent a small infarct versus small area of infection/inflammation. 3. Neoplastic disease in the upper abdomen. These results were called by telephone at the time of interpretation on 06/06/2024 at 2:04 pm to provider Dr. Hall, Who verbally acknowledged these results. Electronically Signed   By: Juliene Balder M.D.   On: 06/06/2024 14:09   CT Angio Abd/Pel w/ and/or w/o Result Date: 06/06/2024 EXAM: CTA ABDOMEN AND PELVIS WITHOUT AND WITH CONTRAST 06/05/2024 02:49:17 PM TECHNIQUE: CTA images of the abdomen and pelvis without and with intravenous contrast. 125 mL (iopamidol  (ISOVUE -370) 76 % injection 125 mL IOPAMIDOL  (ISOVUE -370) INJECTION 76%) was administered. Three-dimensional MIP/volume rendered formations were performed. Automated exposure control, iterative reconstruction, and/or weight based adjustment of the mA/kV was utilized to reduce the radiation dose to as low as reasonably achievable. COMPARISON: Stable compared with the previous exam where applicable. New from prior exam where applicable. CLINICAL HISTORY: FINDINGS: VASCULATURE: AORTA: Aortic atherosclerotic calcification. No acute finding. No abdominal aortic aneurysm. No dissection. CELIAC TRUNK: No acute finding. No occlusion or significant stenosis. SUPERIOR MESENTERIC ARTERY: No acute finding. No occlusion or significant stenosis. RENAL ARTERIES: No acute finding. No occlusion or significant stenosis. ILIAC ARTERIES: No acute finding. No occlusion or significant stenosis. LIVER: Bilobar liver metastases. Index lesion within segment  7 measures 2.9 x 2.4 cm, axial image 24/7. Index  lesion within segment 4a measures 8.7 x 6.6 cm, image 16/7. The index lesion within segment 2 measures 5.4 x 4.2 cm, axial image 16/7. GALLBLADDER AND BILE DUCTS: Circumferential gallbladder wall thickening of the gallbladder measures 4 mm. No biliary ductal dilatation. SPLEEN: The spleen is within normal limits in size and appearance. PANCREAS: The pancreas is normal in size and contour without focal lesion or ductal dilatation. ADRENAL GLANDS: There is a right adrenal nodule measuring 2.1 x 1.8 cm and 9 Hounsfield units, which is stable compared with the previous exam. KIDNEYS, URETERS AND BLADDER: Persistent stone within the urinary bladder near the expected location of the left UVJ measuring 7 mm in length, image 75/7. There is no hydronephrosis identified. No stones in the kidneys or ureters. No perinephric or periureteral stranding. 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 This measures 10.7 x 8.9 x 7.3 cm. On the previous exam this measured 7.2 x 7.5 x 6.9 cm. Mass effect on the patent extrahepatic portal vein is again noted. On today's exam the mass extends up and abuts the surface of the gallbladder with loss of a fat plane between these two structures. The mass also extends up to the serosal surface of the transverse colon with loss of a fat plane between these two structures as well. There is no pathologic dilatation of the bowel loops to indicate a mechanical obstruction. There is a moderate to large stool burden identified within the colon. No signs of active disease. No signs of intraluminal contrast extravasation to suggest active GI bleeding. REPRODUCTIVE: The uterus appears surgically absent. PERITONEUM AND RETRPERITONEUM: Signs of peritoneal metastases. Index a lesion within the right lower quadrant measures 3.1 cm (image 57/7). New from prior exam. Along the right parapelvic sidewall,  there is a lesion measuring 1.2 cm, image 64/7. Midline periumbilical hernia contains fat only. No ascites or free air. LUNG BASE: Filling defect within a segmental branch of the right lower lobe pulmonary artery, compatible with incidental pulmonary embolus, axial image 2/5. LYMPH NODES: Abdominal adenopathy is identified. The index left retroperitoneal node measures 1.1 cm, image 39/7. On the previous exam, this measured 7 mm. BONES AND SOFT TISSUES: Postoperative changes noted within the lumbar spine. No acute or suspicious osseous findings. No acute soft tissue abnormality. IMPRESSION: 1. No evidence of active gastrointestinal bleeding on this CTA abdomen and pelvis. 2. Incidental pulmonary embolism identified within a segmental branch of the right lower lobe pulmonary artery. 3. 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 4. Bilobar hepatic metastatic disease.  New from prior. 5. Progressive abdominal nodal metastatic disease. 6. Peritoneal metastatic disease, new from prior. Electronically signed by: Waddell Calk MD 06/06/2024 07:36 AM EST RP Workstation: [id number]   DG Chest Portable 1 View Result Date: 06/04/2024 EXAM: 1 VIEW(S) XRAY OF THE CHEST 06/04/2024 04:19:00 PM COMPARISON: 05/09/2024 CLINICAL HISTORY: Fever. FINDINGS: LINES, TUBES AND DEVICES: Right chest power injectable port in place with tip at the lower SVC. LUNGS AND PLEURA: Panlike density favoring atelectasis along the left hemidiaphragm. No pleural effusion. No pneumothorax. HEART AND MEDIASTINUM: No acute abnormality of the cardiac and mediastinal silhouettes. BONES AND SOFT TISSUES: No acute osseous abnormality. JOINTS: Mild degenerative glenohumeral arthropathy bilaterally. IMPRESSION: 1.  Mild left basilar atelectasis along the left hemidiaphragm. 2. Right chest power-injectable port with tip in the lower  SVC. 3. Mild bilateral glenohumeral degenerative arthropathy. Electronically signed by: Ryan Salvage MD 06/04/2024 04:52 PM EST RP Workstation: [id number]   IR Radiologist Eval & Mgmt Result Date: 05/27/2024 EXAM: NEW PATIENT OFFICE VISIT CHIEF COMPLAINT: See below HISTORY OF PRESENT ILLNESS: See below REVIEW OF SYSTEMS: See below PHYSICAL EXAMINATION: See below ASSESSMENT AND PLAN: Please refer to completed note in the electronic medical record on [place] [facility] Thom Hall, MD [facility]ascular and Interventional Radiology Specialists [facility] Radiology Electronically Signed   By: Thom Hall M.D.   On: 05/27/2024 09:02   DG Chest 2 View Result Date: 05/09/2024 CLINICAL DATA:  Chest pain EXAM: CHEST - 2 VIEW COMPARISON:  March 29, 2024 FINDINGS: The heart size and mediastinal contours are within normal limits. Stable right internal jugular Port-A-Cath. Both lungs are clear. The visualized skeletal structures are unremarkable. IMPRESSION: No active cardiopulmonary disease. Electronically Signed   By: Lynwood Landy Raddle M.D.   On: 05/09/2024 13:59   US  Venous Img Upper Uni Left (DVT) Result Date: 04/01/2024 CLINICAL DATA:  LEFT upper extremity pain. History of tobacco use and ovarian malignancy. EXAM: LEFT UPPER EXTREMITY VENOUS DOPPLER ULTRASOUND TECHNIQUE: Gray-scale sonography with graded compression, as well as color Doppler and duplex ultrasound were performed to evaluate the upper extremity deep venous system from the level of the subclavian vein and including the jugular, axillary, basilic, radial, ulnar and upper cephalic vein. Spectral Doppler was utilized to evaluate flow at rest and with distal augmentation maneuvers. COMPARISON:  None available FINDINGS: Internal Jugular Vein: No evidence of thrombus. Normal compressibility, respiratory phasicity and response to augmentation. Subclavian Vein: No evidence of thrombus. Normal compressibility, respiratory phasicity and response to augmentation.  Axillary Vein: No evidence of thrombus. Normal compressibility, respiratory phasicity and response to augmentation. Cephalic Vein: Intramural filling defect with decreased flow and compressibility of the cephalic vein and adjacent varicose vein at the level of the mid upper arm is consistent with acute DVT. This also corresponds to the patient's palpable abnormality. Basilic Vein: No evidence of thrombus. Normal compressibility, respiratory phasicity and response to augmentation. Brachial Veins: No evidence of thrombus. Normal compressibility, respiratory phasicity and response to augmentation. Radial Veins: No evidence of thrombus. Normal compressibility, respiratory phasicity and response to augmentation. Ulnar Veins: No evidence of thrombus. Normal compressibility, respiratory phasicity and response to augmentation. Venous Reflux:  None visualized. Other Findings:  None visualized. IMPRESSION: 1. No DVT of the LEFT upper extremity. 2. Acute superficial venous thrombosis of the cephalic vein and adjacent varicose vein at the level of the mid upper arm. Electronically Signed   By: Aliene Lloyd M.D.   On: 04/01/2024 07:35   CT ANGIO GI BLEED Result Date: 03/29/2024 CLINICAL DATA:  Lightheadedness, dark stool for 1 month, epigastric pain for several days, history of ovarian cancer EXAM: CTA ABDOMEN AND PELVIS WITHOUT AND WITH CONTRAST TECHNIQUE: Multidetector CT imaging of the abdomen and pelvis was performed using the standard protocol during bolus administration of intravenous contrast. Multiplanar reconstructed images and MIPs were obtained and reviewed to evaluate the vascular anatomy. RADIATION DOSE REDUCTION: This exam was performed according to the departmental dose-optimization program which includes automated exposure control, adjustment of the mA and/or kV according to patient size and/or use of iterative reconstruction technique. CONTRAST:  OMNIPAQUE  IOHEXOL  350 MG/ML SOLN COMPARISON:  02/22/2024  FINDINGS: VASCULAR Aorta: Normal caliber aorta without aneurysm, dissection, vasculitis or  significant stenosis. Mild atherosclerosis. Celiac: Patent without evidence of aneurysm, dissection, vasculitis or significant stenosis. SMA: Patent without evidence of aneurysm, dissection, vasculitis or significant stenosis. Renals: Both renal arteries are patent without evidence of aneurysm, dissection, vasculitis, fibromuscular dysplasia or significant stenosis. IMA: Patent without evidence of aneurysm, dissection, vasculitis or significant stenosis. Inflow: Patent without evidence of aneurysm, dissection, vasculitis or significant stenosis. Evaluation is slightly limited due to streak artifact from lumbar orthopedic hardware. Proximal Outflow: Bilateral common femoral and visualized portions of the superficial and profunda femoral arteries are patent without evidence of aneurysm, dissection, vasculitis or significant stenosis. Veins: Continue mass effect upon the portal vein confluence due to the large mass between the duodenum and pancreatic head. No other acute venous abnormalities. Review of the MIP images confirms the above findings. NON-VASCULAR Lower chest: Pericardiophrenic lymph node is again identified, measuring 2.4 x 2.2 cm, previously 2.5 x 2.0 cm. No acute airspace disease. Hepatobiliary: No focal liver abnormality is seen. No gallstones, gallbladder wall thickening, or biliary dilatation. Pancreas: Unremarkable. No pancreatic ductal dilatation or surrounding inflammatory changes. Spleen: Normal in size without focal abnormality. Adrenals/Urinary Tract: Kidneys enhance normally. No urinary tract calculi. 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 No left-sided obstruction. 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 No specific follow-up is required. Left adrenal is unremarkable. Bladder is normal. Stomach/Bowel: No bowel  obstruction or ileus. Prior sigmoid colon resection and reanastomosis. Previous appendectomy. There is a large necrotic 7.8 x 8.7 x 8.1 cm mass interposed between the head of the pancreas and duodenal ball. This mass displaces and likely invades the wall of the distal stomach and proximal duodenum, and may be the source for chronic blood loss. However, on today's exam, there is no evidence of contrast accumulation within the bowel lumen to suggest active hemorrhage. Lymphatic: Previous index enlarged lymph node in the right external iliac chain now measures 12 mm in short axis reference image 62/13, previously 9 mm. Stable right external iliac/inguinal lymph node image 69/13 measuring 6 mm in short axis. No new adenopathy is identified. Reproductive: Stable postsurgical changes from hysterectomy. Other: No free fluid or free intraperitoneal gas. Enlarged para incisional hernia in the right supraumbilical region, now containing a short segment of small bowel. No evidence of incarceration or obstruction. Musculoskeletal: Stable postsurgical changes within the lower lumbar spine. There are no acute or destructive bony abnormalities. Reconstructed images demonstrate no additional findings. IMPRESSION: VASCULAR 1. No evidence of active gastrointestinal bleeding. 2.  Aortic Atherosclerosis (ICD10-I70.0). NON-VASCULAR 1. Enlarging necrotic soft tissue mass interposed between the head of the pancreas and proximal duodenum. There is likely invasion of the dorsal wall of the distal stomach and duodenum, and this could be a source for chronic blood loss given clinical history. There is no evidence of active hemorrhage on today's exam. 2. Progressive adenopathy within the pericardiophrenic region and right external iliac chain consistent with worsening metastatic disease. 3. Para incisional hernia in the right supraumbilical region, containing a short segment of small bowel. No incarceration or obstruction. Electronically  Signed   By: Ozell Daring M.D.   On: 03/29/2024 16:19   DG Chest 2 View Result Date: 03/29/2024 CLINICAL DATA:  Chest pain. EXAM: CHEST - 2 VIEW COMPARISON:  Chest CT dated 09/19/2022. FINDINGS: Right-sided Port-A-Cath with tip over central SVC. No focal consolidation, pleural effusion, pneumothorax. The cardiac silhouette is within normal limits. No acute osseous pathology. Degenerative changes of the shoulders. IMPRESSION: No active  cardiopulmonary disease. Electronically Signed   By: Vanetta Chou M.D.   On: 03/29/2024 13:01     Assessment and plan-   # Metastatic ovarian cancer with duodenal metastasis Prognosis is poor. At prior visit, patient expressed interest to pursue chemotherapy. Today she has expressed desire of not doing any  chemotherapy. She is interested in supportive care with pain management, blood transfusion. We had a lengthy discussion about the poor prognosis, aggressive nature of the disease, and the fact that she may quickly decline over the next few weeks. She does not want to proceed with hospice care yet and would like to continue follow-up outpatient at cancer center.   # Acute blood loss anemia.  Status post blood transfusion. Recommend patient to follow-up weekly with close monitoring of her hemoglobin and she will receive PRBC transfusion weekly. She understands that she may develop massive bleeding in the near future which may not be salvageable by blood transfusion.  # Neoplasm related pain, agree with palliative care recommendation.  Consult IR for celiac plexus block  Continue current pain regimen.  Thank you for allowing me to participate in the care of this patient.   Zelphia Cap, MD, PhD Hematology Oncology 06/11/2024     [1]  Allergies Allergen Reactions   Cymbalta [Duloxetine Hcl] Shortness Of Breath, Swelling and Other (See Comments)    Tongue and leg swelling   Cyclobenzaprine Hcl Other (See Comments)    Immobility   Celexa  [Citalopram  Hydrobromide] Swelling and Other (See Comments)    Tongue swelling   Fluticasone  Furoate-Vilanterol Swelling and Other (See Comments)    BREO ELLIPTA - Tongue swelling, but breathing not affected   Gabapentin Other (See Comments)    Feels awful when taking, drowsy    Lyrica  Cr [Pregabalin  Er] Swelling    Numbness   Opana  [Oxymorphone Hcl] Itching and Other (See Comments)    Loss of hair, also   Tramadol Other (See Comments)    Dizziness   [2]  Current Facility-Administered Medications:    acetaminophen  (TYLENOL ) tablet 650 mg, 650 mg, Oral, Q6H PRN **OR** acetaminophen  (TYLENOL ) suppository 650 mg, 650 mg, Rectal, Q6H PRN, Jens Durand, MD   albuterol  (PROVENTIL ) (2.5 MG/3ML) 0.083% nebulizer solution 2.5 mg, 2.5 mg, Nebulization, Q6H PRN, Jens Durand, MD   dexamethasone  (DECADRON ) tablet 4 mg, 4 mg, Oral, Daily, Jens Durand, MD   HYDROmorphone  (DILAUDID ) injection 1 mg, 1 mg, Intravenous, Q3H PRN, Ayiku, Bernard, MD, 1 mg at 06/11/24 1854   hydroxychloroquine  (PLAQUENIL ) tablet 200 mg, 200 mg, Oral, BID, Ayiku, Bernard, MD, 200 mg at 06/11/24 2111   LORazepam  (ATIVAN ) tablet 0.5 mg, 0.5 mg, Oral, TID PRN, Jens Durand, MD, 0.5 mg at 06/10/24 2201   mirtazapine  (REMERON ) tablet 7.5 mg, 7.5 mg, Oral, QHS, Jens Durand, MD, 7.5 mg at 06/11/24 2111   naloxone  (NARCAN ) nasal spray 4 mg/0.1 mL, 1 spray, Nasal, PRN, Jens Durand, MD   oxyCODONE  (Oxy IR/ROXICODONE ) immediate release tablet 20 mg, 20 mg, Oral, 5 X Daily PRN, Ayiku, Bernard, MD, 20 mg at 06/11/24 1620   oxyCODONE  (OXYCONTIN ) 12 hr tablet 15 mg, 15 mg, Oral, Q12H, Ayiku, Bernard, MD, 15 mg at 06/11/24 2111   pantoprazole  (PROTONIX ) EC tablet 40 mg, 40 mg, Oral, Daily, Ayiku, Bernard, MD, 40 mg at 06/11/24 1220   polyethylene glycol (MIRALAX  / GLYCOLAX ) packet 17 g, 17 g, Oral, Daily PRN, Jens Durand, MD   prochlorperazine  (COMPAZINE ) tablet 10 mg, 10 mg, Oral, Q6H PRN, Jens Durand, MD   [  START ON 06/15/2024]  Vitamin D  (Ergocalciferol ) (DRISDOL ) 1.25 MG (50000 UNIT) capsule 50,000 Units, 50,000 Units, Oral, Q Austin Jens Durand, MD  "

## 2024-06-12 ENCOUNTER — Inpatient Hospital Stay

## 2024-06-12 ENCOUNTER — Ambulatory Visit: Admission: RE | Admit: 2024-06-12 | Source: Ambulatory Visit

## 2024-06-12 DIAGNOSIS — D649 Anemia, unspecified: Secondary | ICD-10-CM | POA: Diagnosis not present

## 2024-06-12 DIAGNOSIS — Z515 Encounter for palliative care: Secondary | ICD-10-CM | POA: Diagnosis not present

## 2024-06-12 LAB — BASIC METABOLIC PANEL WITH GFR
Anion gap: 8 (ref 5–15)
BUN: 9 mg/dL (ref 8–23)
CO2: 26 mmol/L (ref 22–32)
Calcium: 7.9 mg/dL — ABNORMAL LOW (ref 8.9–10.3)
Chloride: 96 mmol/L — ABNORMAL LOW (ref 98–111)
Creatinine, Ser: 0.64 mg/dL (ref 0.44–1.00)
GFR, Estimated: >60 mL/min
Glucose, Bld: 109 mg/dL — ABNORMAL HIGH (ref 70–99)
Potassium: 4 mmol/L (ref 3.5–5.1)
Sodium: 130 mmol/L — ABNORMAL LOW (ref 135–145)

## 2024-06-12 LAB — CBC
HCT: 25.1 % — ABNORMAL LOW (ref 36.0–46.0)
Hemoglobin: 7.9 g/dL — ABNORMAL LOW (ref 12.0–15.0)
MCH: 27.7 pg (ref 26.0–34.0)
MCHC: 31.5 g/dL (ref 30.0–36.0)
MCV: 88.1 fL (ref 80.0–100.0)
Platelets: 278 10*3/uL (ref 150–400)
RBC: 2.85 MIL/uL — ABNORMAL LOW (ref 3.87–5.11)
RDW: 17.9 % — ABNORMAL HIGH (ref 11.5–15.5)
WBC: 10 10*3/uL (ref 4.0–10.5)
nRBC: 0 % (ref 0.0–0.2)

## 2024-06-12 LAB — CA 125: Cancer Antigen (CA) 125: 95 U/mL — ABNORMAL HIGH (ref 0.0–38.1)

## 2024-06-12 LAB — MAGNESIUM: Magnesium: 1.7 mg/dL (ref 1.7–2.4)

## 2024-06-12 MED ORDER — ENSURE PLUS HIGH PROTEIN PO LIQD
237.0000 mL | Freq: Two times a day (BID) | ORAL | Status: AC
  Administered 2024-06-12 – 2024-06-13 (×3): 237 mL via ORAL

## 2024-06-12 MED ORDER — SODIUM CHLORIDE 0.9% FLUSH: 10.0000 mL | INTRAVENOUS | Status: AC | PRN

## 2024-06-12 MED ORDER — CHLORHEXIDINE GLUCONATE CLOTH 2 % EX PADS
6.0000 | MEDICATED_PAD | Freq: Every day | CUTANEOUS | Status: AC
  Administered 2024-06-12 – 2024-06-13 (×2): 6 via TOPICAL

## 2024-06-12 MED ORDER — SODIUM CHLORIDE 0.9% FLUSH
10.0000 mL | Freq: Two times a day (BID) | INTRAVENOUS | Status: AC
  Administered 2024-06-12 – 2024-06-13 (×4): 10 mL

## 2024-06-12 NOTE — Consult Note (Incomplete)
 "    Chief Complaint: Patient was seen in consultation today for ***  Referring Provider(s): ***   Supervising Physician: {Supervising Physician:[id number]}  Patient Status: {IR Consult Patient Status:21879}  ***Patient is Full Code  History of Present Illness: [name] is a 69 y.o. female  with PMHx notable for ***     Interventional Radiology was requested for ***. Request was reviewed and approved by ***. Patient is scheduled for same in IR today.   ***Patient is alert and laying in bed, calm.  Patient is currently without any significant complaints.  Patient denies any fevers, headache, chest pain, SOB, cough, abdominal pain, nausea, vomiting or bleeding.     Past Medical History:  Diagnosis Date   Anxiety    Asthma    Back problem    disc disease, chronic low back pain, right leg pain, s/p discectomy 99   Cancer (HCC)    ovarian cancer   Depression    Diastolic dysfunction    with elevated LVEDP at cath   Endometriosis    Fibroids    hx of    GERD (gastroesophageal reflux disease)    Heart murmur    Hepatitis C    treated 8-9 years ago   Hidradenitis suppurativa    History of kidney stones    Hypertension    MS (multiple sclerosis)    Neuromuscular disorder (HCC)    Mulitple sclerosis   Osteoarthritis    Pre-diabetes    Prediabetes    RA (rheumatoid arthritis) (HCC)    Rotator cuff tear    right shoulder    Syncope     Past Surgical History:  Procedure Laterality Date   ANGIOPLASTY     BACK SURGERY     lumbar surgery x 2 done in [place]  and [place]   COLON RESECTION SIGMOID  08/16/2022   Procedure: COLON RESECTION SIGMOID;  Surgeon: Viktoria Comer SAUNDERS, MD;  Location: [facility];  Service: Gynecology;;   COLONOSCOPY  07/2010   tubular adenoma   COLONOSCOPY  08/2015   CYSTOSCOPY N/A 08/16/2022   Procedure: CYSTOSCOPY;  Surgeon: Viktoria Comer SAUNDERS, MD;  Location: [facility];  Service: Gynecology;  Laterality: N/A;    DISKECTOMY     ESOPHAGOGASTRODUODENOSCOPY N/A 03/30/2024   Procedure: EGD (ESOPHAGOGASTRODUODENOSCOPY);  Surgeon: Maryruth Ole DASEN, MD;  Location: [facility];  Service: Endoscopy;  Laterality: N/A;   HYSTERECTOMY ABDOMINAL WITH SALPINGO-OOPHORECTOMY  08/16/2022   Procedure: HYSTERECTOMY ABDOMINAL WITH SALPINGO-OOPHORECTOMY;  Surgeon: Viktoria Comer SAUNDERS, MD;  Location: [facility];  Service: Gynecology;;   IR ANGIOGRAM SELECTIVE EACH ADDITIONAL VESSEL  06/06/2024   IR ANGIOGRAM SELECTIVE EACH ADDITIONAL VESSEL  06/06/2024   IR ANGIOGRAM VISCERAL SELECTIVE  06/06/2024   IR EMBO TUMOR ORGAN ISCHEMIA INFARCT INC GUIDE ROADMAPPING  06/06/2024   IR IMAGING GUIDED PORT INSERTION  05/18/2022   IR IMAGING GUIDED PORT INSERTION  07/03/2023   IR IVC FILTER PLMT / S&I /IMG GUID/MOD SED  06/06/2024   IR RADIOLOGIST EVAL & MGMT  05/27/2024   IR REMOVAL TUN ACCESS W/ PORT W/O FL MOD SED  12/20/2022   IR US  GUIDE BX ASP/DRAIN  05/18/2022   IR US  GUIDE VASC ACCESS RIGHT  06/06/2024   LAPAROSCOPY N/A 08/16/2022   Procedure: LAPAROSCOPY DIAGNOSTIC;  Surgeon: Viktoria Comer SAUNDERS, MD;  Location: [facility];  Service: Gynecology;  Laterality: N/A;   laparoscopy for endometriosis     left shoulder scope     RADIOLOGY WITH ANESTHESIA N/A 05/18/2022  Procedure: IR WITH ANESTHESIA PORT AND BIOPSY;  Surgeon: Luverne Aran, MD;  Location: [facility];  Service: Radiology;  Laterality: N/A;   ROTATOR CUFF REPAIR Left    Spinal Fusion  12/2016   L4-S1    Allergies: Cymbalta [duloxetine hcl], Cyclobenzaprine hcl, Celexa  [citalopram hydrobromide], Fluticasone  furoate-vilanterol, Gabapentin, Lyrica  cr [pregabalin  er], Opana  [oxymorphone hcl], and Tramadol  Medications: Prior to Admission medications  Medication Sig Start Date End Date Taking? Authorizing Provider  albuterol  (VENTOLIN  HFA) 108 (90 Base) MCG/ACT inhaler Inhale 2 puffs into the lungs every 6 (six) hours as needed for shortness of breath.  07/30/21  Yes [provider]  amphetamine -dextroamphetamine  (ADDERALL ) 20 MG tablet Take 10-20 mg by mouth daily as needed. Take 20 mg by mouth in the morning and 10 mg between 1-2 PM daily   Yes [provider]  Ascorbic Acid (VITAMIN C) 1000 MG tablet Take 1,000 mg by mouth daily.   Yes [provider]  ASHWAGANDHA PO Take 1 capsule by mouth daily.   Yes [provider]  atenolol  (TENORMIN ) 25 MG tablet Take 25 mg by mouth daily. 05/31/24  Yes [provider]  baclofen  (LIORESAL ) 10 MG tablet Take 10 mg by mouth 3 (three) times daily as needed.   Yes [provider]  BLACK CURRANT SEED OIL PO Take 1 capsule by mouth daily.   Yes [provider]  dexamethasone  (DECADRON ) 4 MG tablet Take 1 tablet (4 mg total) by mouth daily. 04/18/24  Yes Gorsuch, Ni, MD  diclofenac Sodium (VOLTAREN) 1 % GEL Apply 2 g topically 3 (three) times daily.   Yes [provider]  hydroxychloroquine  (PLAQUENIL ) 200 MG tablet Take 200 mg by mouth 2 (two) times daily. 01/23/24  Yes [provider]  lidocaine -prilocaine  (EMLA ) cream Apply to affected area once 06/25/23  Yes Gorsuch, Ni, MD  LORazepam  (ATIVAN ) 0.5 MG tablet Take 0.5 mg by mouth 3 (three) times daily as needed for anxiety.   Yes [provider]  mirtazapine  (REMERON ) 7.5 MG tablet Take 7.5 mg by mouth at bedtime. 04/22/24  Yes [provider]  Multiple Vitamin (MULTIVITAMIN WITH MINERALS) TABS tablet Take 1 tablet by mouth daily.   Yes [provider]  naloxone  (NARCAN ) nasal spray 4 mg/0.1 mL Place 1 spray into the nose as needed (opioid overdose).   Yes [provider]  ondansetron  (ZOFRAN ) 8 MG tablet Take 1 tablet (8 mg total) by mouth every 8 (eight) hours as needed for nausea or vomiting. 02/29/24  Yes Lonn Hicks, MD  OVER THE COUNTER MEDICATION Take 2 capsules by mouth daily. [place] advanced supplement   Yes [provider]   oxyCODONE  ER (XTAMPZA  ER) 13.5 MG C12A Take 13.5 mg by mouth in the morning, at noon, and at bedtime.   Yes [provider]  Oxycodone  HCl 20 MG TABS Take 20 mg by mouth 5 (five) times daily as needed (pain).   Yes [provider]  polyethylene glycol (MIRALAX  / GLYCOLAX ) 17 g packet Take 17 g by mouth daily.   Yes [provider]  prochlorperazine  (COMPAZINE ) 10 MG tablet Take 1 tablet (10 mg total) by mouth every 6 (six) hours as needed for nausea or vomiting. 02/29/24  Yes Gorsuch, Ni, MD  Vitamin D , Ergocalciferol , (DRISDOL ) 1.25 MG (50000 UNIT) CAPS capsule Take 50,000 Units by mouth every Sunday.   Yes [provider]  NAPOLEON INHUB 250-50 MCG/ACT AEPB Inhale 1 puff into the lungs daily.  Yes [provider]  omeprazole  (PRILOSEC) 20 MG capsule TAKE 1 CAPSULE BY MOUTH EVERY DAY 03/24/24   Lonn Hicks, MD     Family History  Problem Relation Age of Onset   Heart disease Mother        Died with MI 48, chest pain 30s   Hypertension Mother    Pancreatitis Father    Atrial fibrillation Sister    Breast cancer Maternal Aunt        dx <50   Stomach cancer Maternal Grandfather        mets to liver? dx after 50   Multiple sclerosis Cousin    Breast cancer Cousin        mat female cousin; dx unknown age   Colon cancer Cousin 63       mat female cousin; mets   Leukemia Cousin 6       mat female cousin    Social History   Socioeconomic History   Marital status: Single    Spouse name: Not on file   Number of children: 1   Years of education: Not on file   Highest education level: Not on file  Occupational History   Not on file  Tobacco Use   Smoking status: Some Days    Current packs/day: 0.00    Average packs/day: 0.1 packs/day for 27.0 years (2.7 ttl pk-yrs)    Types: Cigarettes    Start date: 89    Last attempt to quit: 2023    Years since quitting: 3.0   Smokeless tobacco: Never  Vaping Use   Vaping  status: Never Used  Substance and Sexual Activity   Alcohol use: Not Currently   Drug use: Yes    Types: Oxycodone    Sexual activity: Not Currently  Other Topics Concern   Not on file  Social History Narrative   Lives alone   Right handed   Caffeine: none    Social Drivers of Health   Tobacco Use: High Risk (06/10/2024)   Patient History    Smoking Tobacco Use: Some Days    Smokeless Tobacco Use: Never    Passive Exposure: Not on file  Financial Resource Strain: Not on file  Food Insecurity: No Food Insecurity (06/11/2024)   Epic    Worried About Programme Researcher, Broadcasting/film/video in the Last Year: Never true    Ran Out of Food in the Last Year: Never true  Transportation Needs: No Transportation Needs (06/11/2024)   Epic    Lack of Transportation (Medical): No    Lack of Transportation (Non-Medical): No  Physical Activity: Not on file  Stress: Not on file  Social Connections: Socially Isolated (06/11/2024)   Social Connection and Isolation Panel    Frequency of Communication with Friends and Family: Three times a week    Frequency of Social Gatherings with Friends and Family: Never    Attends Religious Services: Never    Database Administrator or Organizations: No    Attends Banker Meetings: Never    Marital Status: Divorced  Depression (PHQ2-9): Low Risk (05/28/2024)   Depression (PHQ2-9)    PHQ-2 Score: 0  Alcohol Screen: Not on file  Housing: Low Risk (06/11/2024)   Epic    Unable to Pay for Housing in the Last Year: No    Number of Times Moved in the Last Year: 0    Homeless in the Last Year: No  Utilities: Not At Risk (06/11/2024)   Epic  Threatened with loss of utilities: No  Health Literacy: Not on file     Review of Systems: A 12 point ROS discussed and pertinent positives are indicated in the HPI above.  All other systems are negative.  Vital Signs: BP 139/83 (BP Location: Right Arm)   Pulse (!) 102   Temp 98.1 F (36.7 C) (Oral)    Resp 20   Ht 5' 9 (1.753 m)   Wt 145 lb (65.8 kg)   SpO2 96%   BMI 21.41 kg/m   Advance Care Plan: The advanced care place/surrogate decision maker was discussed at the time of visit and the patient did not wish to discuss or was not able to name a surrogate decision maker or provide an advance care plan.  Physical Exam  Imaging: CT Angio Abd/Pel W and/or Wo Contrast Result Date: 06/10/2024 CLINICAL DATA:  Metastatic ovarian cancer with worsening abdominal pain EXAM: CTA ABDOMEN AND PELVIS WITHOUT AND WITH CONTRAST TECHNIQUE: Multidetector CT imaging of the abdomen and pelvis was performed using the standard protocol during bolus administration of intravenous contrast. Multiplanar reconstructed images and MIPs were obtained and reviewed to evaluate the vascular anatomy. RADIATION DOSE REDUCTION: This exam was performed according to the departmental dose-optimization program which includes automated exposure control, adjustment of the mA and/or kV according to patient size and/or use of iterative reconstruction technique. CONTRAST:  OMNIPAQUE  IOHEXOL  350 MG/ML SOLN COMPARISON:  CTA chest dated 06/06/2024, CTA abdomen and pelvis dated 06/05/2024 and multiple priors FINDINGS: VASCULAR Aorta: Normal caliber aorta without aneurysm, dissection, vasculitis or significant stenosis. Mild aortic atherosclerosis. Celiac: Patent without evidence of aneurysm, dissection, vasculitis or significant stenosis. Prior gastroduodenal artery embolization. SMA: Patent without evidence of aneurysm, dissection, vasculitis or significant stenosis. Renals: Single bilateral renal arteries. Both renal arteries are patent without evidence of aneurysm, dissection, vasculitis, fibromuscular dysplasia or significant stenosis. IMA: Patent without evidence of aneurysm, dissection, vasculitis or significant stenosis. Inflow: Mild narrowing of the bilateral internal iliac artery origins due to atherosclerotic plaque. Otherwise  patent without evidence of aneurysm, dissection, vasculitis or significant stenosis. Proximal Outflow: Bilateral common femoral and visualized portions of the superficial and profunda femoral arteries are patent without evidence of aneurysm, dissection, vasculitis or significant stenosis. Veins: Interval placement of infrarenal IVC filter. Focal filling defect within the right great saphenous vein immediately distal to the saphenofemoral junction (16:197), which appears new compared to 06/05/2024. Filling defect within the left common femoral vein (16:210). Review of the MIP images confirms the above findings. NON-VASCULAR Lower chest: No focal consolidation or pulmonary nodule in the lung bases. No pleural effusion or pneumothorax demonstrated. Partially imaged heart size is normal. Unchanged partially imaged bilateral lower lobe pulmonary emboli. Unchanged 3.4 x 2.5 cm right cardiophrenic nodule (16: 14). Hepatobiliary: Again seen are heterogeneously enhancing bilobar hepatic masses. Index lesions: -3.8 x 2.8 cm segment 7/8 (16:38) unchanged when remeasured -9.5 x 6.2 cm segment 4 (16:29), previously 8.7 x 6.6 cm -6.2 x 4.5 cm segment 2 (16:27), previously 5.4 x 4.2 cm No intra or extrahepatic biliary ductal dilation. Gallbladder is laterally displaced. Mild gallbladder mural thickening and pericholecystic free fluid. Pancreas: No focal lesions or main ductal dilation. Spleen: Normal in size without focal abnormality. Adrenals/Urinary Tract: Unchanged right adrenal nodule size measuring 2.9 x 2.1 cm (16:51) when remeasured. Nodule measures 23 HU precontrast. No left adrenal nodule. Duplex kidneys. No suspicious renal mass or hydronephrosis. Unchanged linear 7 mm calculus at the left ureterovesical junction (16:171). No focal bladder wall thickening.  Stomach/Bowel: Normal appearance of the stomach. Again seen is heterogeneously enhancing mass within the right hemiabdomen centered at the proximal duodenum measuring  11.0 x 8.6 cm (16:78), previously 9.8 x 9.2 cm when measured similarly. The mass continues exerts local mass effect with lateral displacement of the gallbladder and continued effacement of the SMV portal confluence. Findings suspicious for interval invasion of the proximal transverse colon with marked luminal narrowing. Along the periphery of this mass, there are areas of irregular arterial hyperenhancement, for example posteroinferior (9:110), which expands on the venous phase image (16:107). Appendix is not discretely seen. Lymphatic: Slightly increased size of right pelvic sidewall nodule measuring 1.6 x 1.4 cm (16:152), previously 1.5 x 1.2 cm. Unchanged 1.0 cm left para-aortic lymph node (16:86). Increased size of right lower quadrant nodule measuring 3.5 x 2.6 cm (16:130), previously 3 1 x 2.5 cm (remeasured). Reproductive: Status post hysterectomy and bilateral salpingo oophorectomy. Unchanged 2.0 x 1.8 cm nodular soft tissue focus, inseparable from the right vaginal cuff (16:163). Other: Trace right hemi abdominal free fluid. No fluid collection. Asymmetric thickening of the right anterior peritoneum adjacent to the duodenal mass. Musculoskeletal: No acute or abnormal lytic or blastic osseous lesions. Postsurgical changes of L4-S1 spinal fusion. Hardware appears intact. Subcutaneous soft tissue stranding and small focus of hyperdensity in the right gluteal region, likely postprocedural. Mild body wall edema. Small fat-containing paraumbilical hernia. IMPRESSION: 1. No acute aortic pathology. 2. Interval placement of infrarenal IVC filter. Filling defect within the right great saphenous vein immediately distal to the saphenofemoral femoral junction, which appears new compared to 06/05/2024, suspicious for thrombus. Filling defect within the left common femoral vein, consistent with known thrombus. 3. Unchanged partially imaged bilateral lower lobe pulmonary emboli. 4. Increased size of the heterogeneously  enhancing mass within the right hemiabdomen centered at the proximal duodenum with findings suspicious for interval invasion of the proximal transverse colon resulting in marked luminal narrowing. Along the periphery of this mass, there are areas of irregular arterial hyperenhancement, which expands on the venous phase image, favored to represent areas of progressive tumoral enhancement, although areas of active extravasation could have a similar appearance. 5. Bilobar hepatic metastases, many of which have increased in size. Increased size of right lower quadrant nodule and right pelvic sidewall nodule. 6. Unchanged right cardiophrenic and adrenal nodules and left para-aortic lymph node. 7. Unchanged linear 7 mm calculus at the left ureterovesical junction. No hydronephrosis. 8.  Aortic Atherosclerosis (ICD10-I70.0). Electronically Signed   By: Limin  Xu M.D.   On: 06/10/2024 15:48   DG Chest Portable 1 View Result Date: 06/10/2024 CLINICAL DATA:  Shortness of breath EXAM: PORTABLE CHEST 1 VIEW COMPARISON:  June 04, 2024 FINDINGS: The heart size and mediastinal contours are within normal limits. Right internal jugular Port-A-Cath is unchanged. Both lungs are clear. The visualized skeletal structures are unremarkable. IMPRESSION: No active disease. Electronically Signed   By: Lynwood Landy Raddle M.D.   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 EXAM: Title; COIL EMBOLIZATION OF GASTRODUODENAL ARTERY Listed procedures; 1.  ULTRASOUND-GUIDED RIGHT COMMON FEMORAL ARTERY ACCESS 2. MESENTERIC ARTERIOGRAPHY, including CELIAC, SUPERIOR MESENTERIC ARTERY and GASTRODUODENAL ARTERIOGRAMS 3. COIL EMBOLIZATION of the GASTRODUODENAL ARTERY 4. GELFOAM EMBOLIZATION of SUPERIOR MESENTERIC ARTERY BRANCHES COMPARISON:  CT AP, 06/05/2024. MEDICATIONS: 2 g  cefoxitin  IV. The antibiotics were administered within 60 minutes of procedural  initiation. 4 mg Zofran  IV.  50 mg albumin  IV ANESTHESIA/SEDATION: Moderate (conscious) sedation was employed during this procedure. A total of Versed  5 mg and Fentanyl  250 mcg was administered intravenously. Moderate Sedation Time: 90 minutes. The patient's level of consciousness and vital signs were monitored continuously by radiology nursing throughout the procedure under my direct supervision. CONTRAST:  140 mL Omnipaque  300 FLUOROSCOPY: Radiation Exposure Index and estimated peak skin dose (PSD); Reference air kerma (RAK), 429.3 mGy. COMPLICATIONS: None immediate. PROCEDURE: Informed consent was obtained from the patient and/or patient's representative following explanation of the procedure, risks, benefits and alternatives. All questions were addressed. A time out was performed prior to the initiation of the procedure. Maximal barrier sterile technique utilized including caps, mask, sterile gowns, sterile gloves, large sterile drape, hand hygiene, and chlorhexidine  prep. The RIGHT femoral head was marked fluoroscopically. Under sterile conditions and local anesthesia, the RIGHT common femoral artery access was performed with a micropuncture needle. Under direct ultrasound guidance, the RIGHT common femoral was accessed with a micropuncture kit. An ultrasound image was saved for documentation purposes. This allowed for placement of a 6 Fr 35 cm vascular sheath. A limited arteriogram was performed through the side arm of the sheath confirming appropriate access within the RIGHT common femoral artery. A limited abdominal aortogram was performed to help identify the celiac artery origin. Over a Bentson wire, a C2 catheter was advanced, back bled and flushed. The catheter was then utilized to select the celiac access, then advanced into the common hepatic then gastroduodenal arteries. Selective mesenteric arteriograms were performed at  each level. Using a 2.8 Fr Progreat microcatheter and 0.016 inch Fathom microwire access into the gastroduodenal artery was performed and a selective arteriogram was performed. Selective embolization with multiple 0.018 inch micro coils was performed. The microcatheter was removed and arteriogram with the 5 Fr catheter at the common hepatic artery was performed. Adequate pruning of the GDA was achieved on post embolization arteriogram. The C2 catheter was replaced for a Sos catheter, which was then used to select the superior mesenteric artery. Additional selective arteriogram was performed and access into the branches of the SMA, including the inferior pancreaticoduodenal and middle colic artery was obtained with the microcatheter. Gel-Foam embolization was then performed to near-stasis. Post embolization arteriogram demonstrated adequate pruning of the SMA branches supplying the tumor. Images were reviewed and the procedure was terminated. All wires, catheters and sheaths were removed from the patient. Hemostasis was achieved at the RIGHT groin access site with Angio-Seal closure. The patient tolerated the procedure well without immediate post procedural complication. FINDINGS: *Celiac and superior mesenteric arteriograms with normal order and branching. *Multivessel arterial supply to the large duodenal tumor, from the GDA and branches off the SMA, including the inferior pancreaticoduodenal and middle colic arteries. Tumor blush was identified, however no active extravasation was noted. *Coil embolization of the GDA, with additional Gelfoam embolization of the aforementioned SMA branches, with adequate pruning of the vasculature. IMPRESSION: 1. Mesenteric arteriography for duodenal mass, with arterial supply from the celiac axis at the GDA, and the SMA at the inferior pancreaticoduodenal and middle colic arteries. Tumor blush without evidence of active extravasation from the duodenal mass. 2. Successful coil  embolization of the gastroduodenal artery, and Gelfoam embolization of the SMA branches without significant residual tumoral enhancement. Thom Hall, MD [facility] Rad[facility] Electronically Signed   By: Thom Hall M.D.   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 EXAM: Title; COIL EMBOLIZATION OF GASTRODUODENAL ARTERY Listed procedures; 1.  ULTRASOUND-GUIDED RIGHT COMMON FEMORAL ARTERY ACCESS 2. MESENTERIC ARTERIOGRAPHY, including CELIAC, SUPERIOR MESENTERIC ARTERY and GASTRODUODENAL ARTERIOGRAMS 3. COIL EMBOLIZATION of the GASTRODUODENAL ARTERY 4. GELFOAM EMBOLIZATION of SUPERIOR MESENTERIC ARTERY BRANCHES COMPARISON:  CT AP, 06/05/2024. MEDICATIONS: 2 g cefoxitin  IV. The antibiotics were administered within 60 minutes of procedural initiation. 4 mg Zofran  IV.  50 mg albumin  IV ANESTHESIA/SEDATION: Moderate (conscious) sedation was employed during this procedure. A total of Versed  5 mg and Fentanyl  250 mcg was administered intravenously. Moderate Sedation Time: 90 minutes. The patient's level of consciousness and vital signs were monitored continuously by radiology nursing throughout the procedure under my direct supervision. CONTRAST:  140 mL Omnipaque  300 FLUOROSCOPY: Radiation Exposure Index and estimated peak skin dose (PSD); Reference air kerma (RAK), 429.3 mGy. COMPLICATIONS: None immediate. PROCEDURE: Informed consent was obtained from the patient and/or patient's representative following explanation of the procedure, risks, benefits and alternatives. All questions were addressed. A time out was performed prior to the initiation of the procedure. Maximal barrier sterile technique utilized including caps, mask, sterile gowns, sterile gloves, large sterile drape, hand hygiene, and  chlorhexidine  prep. The RIGHT femoral head was marked fluoroscopically. Under sterile conditions and local anesthesia, the RIGHT common femoral artery access was performed with a micropuncture needle. Under direct ultrasound guidance, the RIGHT common femoral was accessed with a micropuncture kit. An ultrasound image was saved for documentation purposes. This allowed for placement of a 6 Fr 35 cm vascular sheath. A limited arteriogram was performed through the side arm of the sheath confirming appropriate access within the RIGHT common femoral artery. A limited abdominal aortogram was performed to help identify the celiac artery origin. Over a Bentson wire, a C2 catheter was advanced, back bled and flushed. The catheter was then utilized to select the celiac access, then advanced into the common hepatic then gastroduodenal arteries. Selective mesenteric arteriograms were performed at each level. Using a 2.8 Fr Progreat microcatheter and 0.016 inch Fathom microwire access into the gastroduodenal artery was performed and a selective arteriogram was performed. Selective embolization with multiple 0.018 inch micro coils was performed. The microcatheter was removed and arteriogram with the 5 Fr catheter at the common hepatic artery was performed. Adequate pruning of the GDA was achieved on post embolization arteriogram. The C2 catheter was replaced for a Sos catheter, which was then used to select the superior mesenteric artery. Additional selective arteriogram was performed and access into the branches of the SMA, including the inferior pancreaticoduodenal and middle colic artery was obtained with the microcatheter. Gel-Foam embolization was then performed to near-stasis. Post embolization arteriogram demonstrated adequate pruning of the SMA branches supplying the tumor. Images were reviewed and the procedure was terminated. All wires, catheters and sheaths were removed from the patient. Hemostasis was achieved at the  RIGHT groin access site with Angio-Seal closure. The patient tolerated the procedure well without immediate post procedural complication. FINDINGS: *Celiac and superior mesenteric arteriograms with normal order and branching. *Multivessel arterial supply to the large duodenal tumor, from the GDA and branches off the SMA, including the inferior pancreaticoduodenal and middle colic arteries. Tumor blush was identified, however no active extravasation was noted. *Coil embolization of the GDA, with additional Gelfoam embolization of the aforementioned SMA branches, with adequate pruning of the vasculature. IMPRESSION: 1. Mesenteric arteriography for duodenal mass, with arterial supply from  the celiac axis at the GDA, and the SMA at the inferior pancreaticoduodenal and middle colic arteries. Tumor blush without evidence of active extravasation from the duodenal mass. 2. Successful coil embolization of the gastroduodenal artery, and Gelfoam embolization of the SMA branches without significant residual tumoral enhancement. Thom Hall, MD [facility] Radiology Electronically Signed   By: Thom Hall M.D.   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 EXAM: Title; COIL EMBOLIZATION OF GASTRODUODENAL ARTERY Listed procedures; 1.  ULTRASOUND-GUIDED RIGHT COMMON FEMORAL ARTERY ACCESS 2. MESENTERIC ARTERIOGRAPHY, including CELIAC, SUPERIOR MESENTERIC ARTERY and GASTRODUODENAL ARTERIOGRAMS 3. COIL EMBOLIZATION of the GASTRODUODENAL ARTERY 4. GELFOAM EMBOLIZATION of SUPERIOR MESENTERIC ARTERY BRANCHES COMPARISON:  CT AP, 06/05/2024. MEDICATIONS: 2 g cefoxitin  IV. The antibiotics were administered within 60 minutes of procedural initiation. 4 mg Zofran  IV.  50 mg albumin  IV  ANESTHESIA/SEDATION: Moderate (conscious) sedation was employed during this procedure. A total of Versed  5 mg and Fentanyl  250 mcg was administered intravenously. Moderate Sedation Time: 90 minutes. The patient's level of consciousness and vital signs were monitored continuously by radiology nursing throughout the procedure under my direct supervision. CONTRAST:  140 mL Omnipaque  300 FLUOROSCOPY: Radiation Exposure Index and estimated peak skin dose (PSD); Reference air kerma (RAK), 429.3 mGy. COMPLICATIONS: None immediate. PROCEDURE: Informed consent was obtained from the patient and/or patient's representative following explanation of the procedure, risks, benefits and alternatives. All questions were addressed. A time out was performed prior to the initiation of the procedure. Maximal barrier sterile technique utilized including caps, mask, sterile gowns, sterile gloves, large sterile drape, hand hygiene, and chlorhexidine  prep. The RIGHT femoral head was marked fluoroscopically. Under sterile conditions and local anesthesia, the RIGHT common femoral artery access was performed with a micropuncture needle. Under direct ultrasound guidance, the RIGHT common femoral was accessed with a micropuncture kit. An ultrasound image was saved for documentation purposes. This allowed for placement of a 6 Fr 35 cm vascular sheath. A limited arteriogram was performed through the side arm of the sheath confirming appropriate access within the RIGHT common femoral artery. A limited abdominal aortogram was performed to help identify the celiac artery origin. Over a Bentson wire, a C2 catheter was advanced, back bled and flushed. The catheter was then utilized to select the celiac access, then advanced into the common hepatic then gastroduodenal arteries. Selective mesenteric arteriograms were performed at each level. Using a 2.8 Fr Progreat microcatheter and 0.016 inch Fathom microwire access into the gastroduodenal artery was  performed and a selective arteriogram was performed. Selective embolization with multiple 0.018 inch micro coils was performed. The microcatheter was removed and arteriogram with the 5 Fr catheter at the common hepatic artery was performed. Adequate pruning of the GDA was achieved on post embolization arteriogram. The C2 catheter was replaced for a Sos catheter, which was then used to select the superior mesenteric artery. Additional selective arteriogram was performed and access into the branches of the SMA, including the inferior pancreaticoduodenal and middle colic artery was obtained with the microcatheter. Gel-Foam embolization was then performed to near-stasis. Post embolization arteriogram demonstrated adequate pruning of the SMA branches supplying the tumor. Images were reviewed and the procedure was terminated. All wires, catheters and sheaths were removed from the patient. Hemostasis was achieved at the RIGHT groin access site with Angio-Seal closure. The patient tolerated the  procedure well without immediate post procedural complication. FINDINGS: *Celiac and superior mesenteric arteriograms with normal order and branching. *Multivessel arterial supply to the large duodenal tumor, from the GDA and branches off the SMA, including the inferior pancreaticoduodenal and middle colic arteries. Tumor blush was identified, however no active extravasation was noted. *Coil embolization of the GDA, with additional Gelfoam embolization of the aforementioned SMA branches, with adequate pruning of the vasculature. IMPRESSION: 1. Mesenteric arteriography for duodenal mass, with arterial supply from the celiac axis at the GDA, and the SMA at the inferior pancreaticoduodenal and middle colic arteries. Tumor blush without evidence of active extravasation from the duodenal mass. 2. Successful coil embolization of the gastroduodenal artery, and Gelfoam embolization of the SMA branches without significant residual tumoral  enhancement. Thom Hall, MD [facility] Radiology Electronically Signed   By: Thom Hall M.D.   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 EXAM: Title; COIL EMBOLIZATION OF GASTRODUODENAL ARTERY Listed procedures; 1.  ULTRASOUND-GUIDED RIGHT COMMON FEMORAL ARTERY ACCESS 2. MESENTERIC ARTERIOGRAPHY, including CELIAC, SUPERIOR MESENTERIC ARTERY and GASTRODUODENAL ARTERIOGRAMS 3. COIL EMBOLIZATION of the GASTRODUODENAL ARTERY 4. GELFOAM EMBOLIZATION of SUPERIOR MESENTERIC ARTERY BRANCHES COMPARISON:  CT AP, 06/05/2024. MEDICATIONS: 2 g cefoxitin  IV. The antibiotics were administered within 60 minutes of procedural initiation. 4 mg Zofran  IV.  50 mg albumin  IV ANESTHESIA/SEDATION: Moderate (conscious) sedation was employed during this procedure. A total of Versed  5 mg and Fentanyl  250 mcg was administered intravenously. Moderate Sedation Time: 90 minutes. The patient's level of consciousness and vital signs were monitored continuously by radiology nursing throughout the procedure under my direct supervision. CONTRAST:  140 mL Omnipaque  300 FLUOROSCOPY: Radiation Exposure Index and estimated peak skin dose (PSD); Reference air kerma (RAK), 429.3 mGy. COMPLICATIONS: None immediate. PROCEDURE: Informed consent was obtained from the patient and/or patient's representative following explanation of the procedure, risks, benefits and alternatives. All questions were addressed. A time out was performed prior to the initiation of the procedure. Maximal barrier sterile technique utilized including caps, mask, sterile gowns, sterile gloves, large sterile drape, hand hygiene, and chlorhexidine  prep. The RIGHT femoral head was marked fluoroscopically. Under sterile conditions and local  anesthesia, the RIGHT common femoral artery access was performed with a micropuncture needle. Under direct ultrasound guidance, the RIGHT common femoral was accessed with a micropuncture kit. An ultrasound image was saved for documentation purposes. This allowed for placement of a 6 Fr 35 cm vascular sheath. A limited arteriogram was performed through the side arm of the sheath confirming appropriate access within the RIGHT common femoral artery. A limited abdominal aortogram was performed to help identify the celiac artery origin. Over a Bentson wire, a C2 catheter was advanced, back bled and flushed. The catheter was then utilized to select the celiac access, then advanced into the common hepatic then gastroduodenal arteries. Selective mesenteric arteriograms were performed at each level. Using a 2.8 Fr Progreat microcatheter and 0.016 inch Fathom microwire access into the gastroduodenal artery was performed and a selective arteriogram was performed. Selective embolization with multiple 0.018 inch micro coils was performed. The microcatheter was removed and arteriogram with the 5 Fr catheter at the common hepatic artery was performed. Adequate pruning of the GDA was achieved on post embolization arteriogram. The C2 catheter was replaced for a Sos catheter, which was then used to select the  superior mesenteric artery. Additional selective arteriogram was performed and access into the branches of the SMA, including the inferior pancreaticoduodenal and middle colic artery was obtained with the microcatheter. Gel-Foam embolization was then performed to near-stasis. Post embolization arteriogram demonstrated adequate pruning of the SMA branches supplying the tumor. Images were reviewed and the procedure was terminated. All wires, catheters and sheaths were removed from the patient. Hemostasis was achieved at the RIGHT groin access site with Angio-Seal closure. The patient tolerated the procedure well without immediate  post procedural complication. FINDINGS: *Celiac and superior mesenteric arteriograms with normal order and branching. *Multivessel arterial supply to the large duodenal tumor, from the GDA and branches off the SMA, including the inferior pancreaticoduodenal and middle colic arteries. Tumor blush was identified, however no active extravasation was noted. *Coil embolization of the GDA, with additional Gelfoam embolization of the aforementioned SMA branches, with adequate pruning of the vasculature. IMPRESSION: 1. Mesenteric arteriography for duodenal mass, with arterial supply from the celiac axis at the GDA, and the SMA at the inferior pancreaticoduodenal and middle colic arteries. Tumor blush without evidence of active extravasation from the duodenal mass. 2. Successful coil embolization of the gastroduodenal artery, and Gelfoam embolization of the SMA branches without significant residual tumoral enhancement. Thom Hall, MD [facility] Radiology Electronically Signed   By: Thom Hall M.D.   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 EXAM: Title; COIL EMBOLIZATION OF GASTRODUODENAL ARTERY Listed procedures; 1.  ULTRASOUND-GUIDED RIGHT COMMON FEMORAL ARTERY ACCESS 2. MESENTERIC ARTERIOGRAPHY, including CELIAC, SUPERIOR MESENTERIC ARTERY and GASTRODUODENAL ARTERIOGRAMS 3. COIL EMBOLIZATION of the GASTRODUODENAL ARTERY 4. GELFOAM EMBOLIZATION of SUPERIOR MESENTERIC ARTERY BRANCHES COMPARISON:  CT AP, 06/05/2024. MEDICATIONS: 2 g cefoxitin  IV. The antibiotics were administered within 60 minutes of procedural initiation. 4 mg Zofran  IV.  50 mg albumin  IV ANESTHESIA/SEDATION: Moderate (conscious) sedation was employed during this procedure. A total of Versed  5 mg and  Fentanyl  250 mcg was administered intravenously. Moderate Sedation Time: 90 minutes. The patient's level of consciousness and vital signs were monitored continuously by radiology nursing throughout the procedure under my direct supervision. CONTRAST:  140 mL Omnipaque  300 FLUOROSCOPY: Radiation Exposure Index and estimated peak skin dose (PSD); Reference air kerma (RAK), 429.3 mGy. COMPLICATIONS: None immediate. PROCEDURE: Informed consent was obtained from the patient and/or patient's representative following explanation of the procedure, risks, benefits and alternatives. All questions were addressed. A time out was performed prior to the initiation of the procedure. Maximal barrier sterile technique utilized including caps, mask, sterile gowns, sterile gloves, large sterile drape, hand hygiene, and chlorhexidine  prep. The RIGHT femoral head was marked fluoroscopically. Under sterile conditions and local anesthesia, the RIGHT common femoral artery access was performed with a micropuncture needle. Under direct ultrasound guidance, the RIGHT common femoral was accessed with a micropuncture kit. An ultrasound image was saved for documentation purposes. This allowed for placement of a 6 Fr 35 cm vascular sheath. A limited arteriogram was performed through the side arm of the sheath confirming appropriate access within the RIGHT common femoral artery. A limited abdominal aortogram was performed to help identify the celiac artery origin. Over a Bentson wire, a C2 catheter was advanced, back bled and flushed. The catheter was then utilized to select the celiac access, then advanced into the common hepatic then gastroduodenal arteries. Selective  mesenteric arteriograms were performed at each level. Using a 2.8 Fr Progreat microcatheter and 0.016 inch Fathom microwire access into the gastroduodenal artery was performed and a selective arteriogram was performed. Selective embolization with multiple 0.018 inch micro coils was  performed. The microcatheter was removed and arteriogram with the 5 Fr catheter at the common hepatic artery was performed. Adequate pruning of the GDA was achieved on post embolization arteriogram. The C2 catheter was replaced for a Sos catheter, which was then used to select the superior mesenteric artery. Additional selective arteriogram was performed and access into the branches of the SMA, including the inferior pancreaticoduodenal and middle colic artery was obtained with the microcatheter. Gel-Foam embolization was then performed to near-stasis. Post embolization arteriogram demonstrated adequate pruning of the SMA branches supplying the tumor. Images were reviewed and the procedure was terminated. All wires, catheters and sheaths were removed from the patient. Hemostasis was achieved at the RIGHT groin access site with Angio-Seal closure. The patient tolerated the procedure well without immediate post procedural complication. FINDINGS: *Celiac and superior mesenteric arteriograms with normal order and branching. *Multivessel arterial supply to the large duodenal tumor, from the GDA and branches off the SMA, including the inferior pancreaticoduodenal and middle colic arteries. Tumor blush was identified, however no active extravasation was noted. *Coil embolization of the GDA, with additional Gelfoam embolization of the aforementioned SMA branches, with adequate pruning of the vasculature. IMPRESSION: 1. Mesenteric arteriography for duodenal mass, with arterial supply from the celiac axis at the GDA, and the SMA at the inferior pancreaticoduodenal and middle colic arteries. Tumor blush without evidence of active extravasation from the duodenal mass. 2. Successful coil embolization of the gastroduodenal artery, and Gelfoam embolization of the SMA branches without significant residual tumoral enhancement. Thom Hall, MD [facility] Radiology [facility] Radiology Electronically  Signed   By: Thom Hall M.D.   On: 06/09/2024 14:52   VAS US  LOWER EXTREMITY VENOUS (DVT) Result Date: 06/09/2024  Lower Venous DVT Study Patient Name:  [name]  Date of Exam:   06/06/2024 Medical Rec #: [id number]        Accession #:    [id number] Date of Birth: 04-Jul-1955       Patient Gender: F Patient Age:   61 years Exam Location:  [facility] Procedure:      VAS US  LOWER EXTREMITY VENOUS (DVT) Referring Phys: THOM HALL --------------------------------------------------------------------------------  Indications: Incidental pulmonary embolism on abdominal CTA 06/05/24 Other Indications: Status post mesenteric arteriography and duodenal mass                    embolization with IVC filter placement 06/06/24. 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 Limitations: Bandage right groin. Comparison Study: Prior negative left LEV done 05/29/22 Performing Technologist: Alberta Lis RVS  Examination Guidelines: A complete evaluation includes B-mode imaging, spectral Doppler, color Doppler, and power Doppler as needed of all accessible portions of each vessel. Bilateral testing is considered an integral part of a complete examination. Limited examinations for reoccurring indications may be performed as noted. The reflux portion of the exam is performed with the patient in reverse Trendelenburg.  +---------+---------------+---------+-----------+----------+--------------+ RIGHT    CompressibilityPhasicitySpontaneityPropertiesThrombus Aging +---------+---------------+---------+-----------+----------+--------------+ CFV      Full           Yes      No                                  +---------+---------------+---------+-----------+----------+--------------+  SFJ      Full                                                        +---------+---------------+---------+-----------+----------+--------------+ FV Prox  Full           Yes      No                                   +---------+---------------+---------+-----------+----------+--------------+ FV Mid   Full                                                        +---------+---------------+---------+-----------+----------+--------------+ FV DistalFull                                                        +---------+---------------+---------+-----------+----------+--------------+ PFV      Full           Yes      No                                  +---------+---------------+---------+-----------+----------+--------------+ POP      Full           Yes      No                                  +---------+---------------+---------+-----------+----------+--------------+ PTV      Full                                                        +---------+---------------+---------+-----------+----------+--------------+ PERO     Full                                                        +---------+---------------+---------+-----------+----------+--------------+   +---------+---------------+---------+-----------+----------+--------------+ LEFT     CompressibilityPhasicitySpontaneityPropertiesThrombus Aging +---------+---------------+---------+-----------+----------+--------------+ CFV      Full           Yes      No                                  +---------+---------------+---------+-----------+----------+--------------+ SFJ      Full                                                        +---------+---------------+---------+-----------+----------+--------------+  FV Prox  None           No       No                   Acute          +---------+---------------+---------+-----------+----------+--------------+ FV Mid   None           No       No                   Acute          +---------+---------------+---------+-----------+----------+--------------+ FV DistalNone           No       No                   Acute           +---------+---------------+---------+-----------+----------+--------------+ PFV      Full           Yes      No                                  +---------+---------------+---------+-----------+----------+--------------+ POP      None           No       No                   Acute          +---------+---------------+---------+-----------+----------+--------------+ PTV      None                                         Acute          +---------+---------------+---------+-----------+----------+--------------+ PERO     None                                         Acute          +---------+---------------+---------+-----------+----------+--------------+     Summary: RIGHT: - There is no evidence of deep vein thrombosis in the lower extremity.  - No cystic structure found in the popliteal fossa.  LEFT: - Findings consistent with acute deep vein thrombosis involving the left femoral vein, left popliteal vein, left posterior tibial veins, and left peroneal veins.  - No cystic structure found in the popliteal fossa.  *See table(s) above for measurements and observations. Electronically signed by Gaile New MD on 06/09/2024 at 10:45:02 AM.    Final    IR IVC FILTER PLMT / S&I PORTER GUID/MOD SED Result Date: 06/07/2024 INDICATION: duodenal mass Briefly, 69 year old female with a history of metastatic ovarian cancer, bleeding duodenal metastasis, and new, incidental PE and LEFT lower extremity DVT. Unable to anticoagulate. EXAM: Title; INFERIOR VENA CAVA (IVC) FILTER PLACEMENT Listed procedures; 1. ULTRASOUND-GUIDED RIGHT GREATER SAPHENOUS VEIN ACCESS 2. CENTRAL VENOGRAM 3. INFERIOR VENA CAVA FILTER PLACEMENT MEDICATIONS: None. ANESTHESIA/SEDATION: Moderate (conscious) sedation was employed during this procedure. A total of Versed  1 mg and Fentanyl  50 mcg was administered intravenously. Moderate Sedation Time: 18 minutes. The patient's level of consciousness and vital signs were monitored  continuously by radiology nursing throughout the procedure under my direct supervision. CONTRAST:  60 mL Omnipaque  300 FLUOROSCOPY: Radiation Exposure Index and estimated  peak skin dose (PSD); Reference air kerma (RAK), 111.9 mGy. COMPLICATIONS: None immediate. PROCEDURE: Informed written consent was obtained from the patient and/or patient's representative following explanation of the procedure, risks, benefits and alternatives. A time out was performed prior to the initiation of the procedure. Maximal barrier sterile technique utilized including caps, mask, sterile gowns, sterile gloves, large sterile drape, hand hygiene, and sterile prep. Under sterile condition and local anesthesia, RIGHT greater saphenous vein access was performed with ultrasound. An ultrasound image was saved and sent to PACS. Over a guidewire, the IVC filter delivery sheath and inner dilator were advanced into the IVC just above the IVC bifurcation. Contrast injection was performed for an IVC venogram. Through the delivery sheath, a retrievable Denali IVC filter was deployed below the level of the renal veins and above the IVC bifurcation. Limited post deployment venacavagram was performed. The delivery sheath was removed and hemostasis was obtained with manual compression. A dressing was placed. The patient tolerated the procedure well without immediate post procedural complication. FINDINGS: The IVC is patent. No evidence of thrombus, stenosis, or occlusion. No variant venous anatomy. IMPRESSION: Successful placement of a retrievable infrarenal inferior vena cava (IVC) filter PLAN: IVC filters can cause complications when left in place for extended periods of time. If medically appropriate, recommend discontinuing filter prior to discharge. Please re-evaluate the patient for filter discontinuation when they are seen in follow up, and refer patient to Interventional Radiology for removal. Thom Hall, MD Vascular and Interventional Radiology  Specialists [facility] Radiology Electronically Signed   By: Thom Hall M.D.   On: 06/07/2024 07:44   CT Angio Chest Pulmonary Embolism (PE) W or WO Contrast Result Date: 06/06/2024 CLINICAL DATA:  Acute pulmonary embolism, unspecified pulmonary embolism type, unspecified whether acute cor pulmonale present. Pulmonary embolism seen on CTA from 06/05/2024. EXAM: CT ANGIOGRAPHY CHEST WITH CONTRAST TECHNIQUE: Multidetector CT imaging of the chest was performed using the standard protocol during bolus administration of intravenous contrast. Multiplanar CT image reconstructions and MIPs were obtained to evaluate the vascular anatomy. RADIATION DOSE REDUCTION: This exam was performed according to the departmental dose-optimization program which includes automated exposure control, adjustment of the mA and/or kV according to patient size and/or use of iterative reconstruction technique. CONTRAST:  75mL OMNIPAQUE  IOHEXOL  350 MG/ML SOLN COMPARISON:  CTA abdomen pelvis 06/05/2024 FINDINGS: Cardiovascular: Positive for bilateral pulmonary emboli. Moderate clot burden with predominantly nonocclusive clot bilaterally. There is a small amount of clot in the distal right pulmonary artery extending into the truncus anterior and right interlobar artery. Clot extending into right segmental pulmonary arteries. Clot in the left lobar and segmental pulmonary arteries. No evidence for right heart strain. Normal caliber of the thoracic aorta. Arch great vessels are patent. Heart size is normal. No significant pericardial effusion. Embolization coils involving the gastroduodenal artery. Mediastinum/Nodes: Again noted is a soft tissue mass in the right anterior cardiophrenic fat measuring up to 3.4 cm. No significant mediastinal or hilar lymph node enlargement. No axillary lymph enlargement. Lungs/Pleura: Trachea and mainstem bronchi are patent. No pleural effusions. Small parenchymal densities or airspace densities in the medial right  upper lobe on image 51, sequence 7 are new since 2023. Findings could be associated with infarct versus small area of airspace disease. 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 These findings are poorly characterized on this chest CT. Right adrenal nodule. Musculoskeletal: No acute bone abnormality. Review of the MIP images confirms the above findings. IMPRESSION:  1. Positive for bilateral pulmonary emboli with moderate clot burden. Pulmonary emboli are predominantly nonocclusive and findings could be subacute. No evidence for right heart strain. 2. Parenchymal densities in the medial right lung could represent a small infarct versus small area of infection/inflammation. 3. Neoplastic disease in the upper abdomen. These results were called by telephone at the time of interpretation on 06/06/2024 at 2:04 pm to provider Dr. Hughes, Who verbally acknowledged these results. Electronically Signed   By: Juliene Balder M.D.   On: 06/06/2024 14:09   CT Angio Abd/Pel w/ and/or w/o Result Date: 06/06/2024 EXAM: CTA ABDOMEN AND PELVIS WITHOUT AND WITH CONTRAST 06/05/2024 02:49:17 PM TECHNIQUE: CTA images of the abdomen and pelvis without and with intravenous contrast. 125 mL (iopamidol  (ISOVUE -370) 76 % injection 125 mL IOPAMIDOL  (ISOVUE -370) INJECTION 76%) was administered. Three-dimensional MIP/volume rendered formations were performed. Automated exposure control, iterative reconstruction, and/or weight based adjustment of the mA/kV was utilized to reduce the radiation dose to as low as reasonably achievable. COMPARISON: Stable compared with the previous exam where applicable. New from prior exam where applicable. CLINICAL HISTORY: FINDINGS: VASCULATURE: AORTA: Aortic atherosclerotic calcification. No acute finding. No abdominal aortic aneurysm. No dissection. CELIAC TRUNK: No acute finding. No occlusion or significant stenosis. SUPERIOR MESENTERIC ARTERY: No  acute finding. No occlusion or significant stenosis. RENAL ARTERIES: No acute finding. No occlusion or significant stenosis. ILIAC ARTERIES: No acute finding. No occlusion or significant stenosis. LIVER: Bilobar liver metastases. Index lesion within segment 7 measures 2.9 x 2.4 cm, axial image 24/7. Index lesion within segment 4a measures 8.7 x 6.6 cm, image 16/7. The index lesion within segment 2 measures 5.4 x 4.2 cm, axial image 16/7. GALLBLADDER AND BILE DUCTS: Circumferential gallbladder wall thickening of the gallbladder measures 4 mm. No biliary ductal dilatation. SPLEEN: The spleen is within normal limits in size and appearance. PANCREAS: The pancreas is normal in size and contour without focal lesion or ductal dilatation. ADRENAL GLANDS: There is a right adrenal nodule measuring 2.1 x 1.8 cm and 9 Hounsfield units, which is stable compared with the previous exam. KIDNEYS, URETERS AND BLADDER: Persistent stone within the urinary bladder near the expected location of the left UVJ measuring 7 mm in length, image 75/7. There is no hydronephrosis identified. No stones in the kidneys or ureters. No perinephric or periureteral stranding. 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 This measures 10.7 x 8.9 x 7.3 cm. On the previous exam this measured 7.2 x 7.5 x 6.9 cm. Mass effect on the patent extrahepatic portal vein is again noted. On today's exam the mass extends up and abuts the surface of the gallbladder with loss of a fat plane between these two structures. The mass also extends up to the serosal surface of the transverse colon with loss of a fat plane between these two structures as well. There is no pathologic dilatation of the bowel loops to indicate a mechanical obstruction. There is a moderate to large stool burden identified within the colon. No signs of active disease. No signs of  intraluminal contrast extravasation to suggest active GI bleeding. REPRODUCTIVE: The uterus appears surgically absent. PERITONEUM AND RETRPERITONEUM: Signs of peritoneal metastases. Index a lesion within the right lower quadrant measures 3.1 cm (image 57/7). New from prior exam. Along the right parapelvic sidewall, there is a lesion measuring 1.2 cm, image 64/7. Midline periumbilical hernia contains fat only. No  ascites or free air. LUNG BASE: Filling defect within a segmental branch of the right lower lobe pulmonary artery, compatible with incidental pulmonary embolus, axial image 2/5. LYMPH NODES: Abdominal adenopathy is identified. The index left retroperitoneal node measures 1.1 cm, image 39/7. On the previous exam, this measured 7 mm. BONES AND SOFT TISSUES: Postoperative changes noted within the lumbar spine. No acute or suspicious osseous findings. No acute soft tissue abnormality. IMPRESSION: 1. No evidence of active gastrointestinal bleeding on this CTA abdomen and pelvis. 2. Incidental pulmonary embolism identified within a segmental branch of the right lower lobe pulmonary artery. 3. 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 4. Bilobar hepatic metastatic disease.  New from prior. 5. Progressive abdominal nodal metastatic disease. 6. Peritoneal metastatic disease, new from prior. Electronically signed by: Waddell Calk MD 06/06/2024 07:36 AM EST RP Workstation: [id number]   DG Chest Portable 1 View Result Date: 06/04/2024 EXAM: 1 VIEW(S) XRAY OF THE CHEST 06/04/2024 04:19:00 PM COMPARISON: 05/09/2024 CLINICAL HISTORY: Fever. FINDINGS: LINES, TUBES AND DEVICES: Right chest power injectable port in place with tip at the lower SVC. LUNGS AND PLEURA: Panlike density favoring atelectasis along the left hemidiaphragm. No pleural effusion. No pneumothorax. HEART AND MEDIASTINUM:  No acute abnormality of the cardiac and mediastinal silhouettes. BONES AND SOFT TISSUES: No acute osseous abnormality. JOINTS: Mild degenerative glenohumeral arthropathy bilaterally. IMPRESSION: 1. Mild left basilar atelectasis along the left hemidiaphragm. 2. Right chest power-injectable port with tip in the lower SVC. 3. Mild bilateral glenohumeral degenerative arthropathy. Electronically signed by: Ryan Salvage MD 06/04/2024 04:52 PM EST RP Workstation: [id number]   IR Radiologist Eval & Mgmt Result Date: 05/27/2024 EXAM: NEW PATIENT OFFICE VISIT CHIEF COMPLAINT: See below HISTORY OF PRESENT ILLNESS: See below REVIEW OF SYSTEMS: See below PHYSICAL EXAMINATION: See below ASSESSMENT AND PLAN: Please refer to completed note in the electronic medical record on [place] Thom Hall, MD Vascular and Interventional Radiology Specialists [facility] Radiology Electronically Signed   By: Thom Hall M.D.   On: 05/27/2024 09:02    Labs:  CBC: Recent Labs    06/06/24 0752 06/10/24 1237 06/11/24 0602 06/12/24 0532  WBC 7.2 8.6 7.6 10.0  HGB 8.6* 6.5* 8.2* 7.9*  HCT 26.5* 20.2* 26.0* 25.1*  PLT 188 255 259 278    COAGS: Recent Labs    06/06/24 0752  INR 1.0    BMP: Recent Labs    06/06/24 0752 06/10/24 1237 06/11/24 0443 06/12/24 0532  NA 136 135 131* 130*  K 3.5 3.0* 4.7 4.0  CL 101 99 99 96*  CO2 25 23 22 26   GLUCOSE 130* 126* 108* 109*  BUN 18 12 10 9   CALCIUM 8.2* 7.9* 7.6* 7.9*  CREATININE 0.82 0.58 0.67 0.64  GFRNONAA >60 >60 >60 >60    LIVER FUNCTION TESTS: Recent Labs    05/15/24 0854 05/20/24 1413 06/04/24 1414 06/10/24 1237  BILITOT <0.2 0.3 0.5 0.6  AST 23 21 43* 31  ALT 15 10 19 13   ALKPHOS 77 80 93 98  PROT 6.9 6.6 5.9* 5.7*  ALBUMIN  3.6 3.3* 2.8* 2.8*    TUMOR MARKERS: No results for input(s): AFPTM, CEA, CA199, CHROMGRNA in the last 8760 hours.  Assessment and Plan: ***  Patient presents for scheduled *** in IR  today.  ***Patient has been NPO since midnight.  All labs and medications are within acceptable parameters.  No pertinent allergies.   ***     Thank you for allowing our service to participate in [name] 's care.  Electronically Signed: Carlin DELENA Griffon, PA-C   06/12/2024, 11:52 PM      I spent a total of {New PWEU:[id number]} {New Out-Pt:[id number]}  {Established Out-Pt:[id number]} in face to face in clinical consultation, greater than 50% of which was counseling/coordinating care for ***   "

## 2024-06-12 NOTE — Addendum Note (Signed)
 Encounter addended by: Miamor Ayler M, PA-C on: 06/12/2024 11:33 AM  Actions taken: Pend clinical note, Delete clinical note

## 2024-06-12 NOTE — Progress Notes (Signed)
 " Progress Note   Patient: BARBIE CROSTON FMW:[id number] DOB: 09-Oct-1955 DOA: 06/10/2024  DOS: the patient was seen and examined on 06/12/2024   Brief hospital course:  69 y.o. female with medical history significant for metastatic ovarian cancer with duodenal metastasis and recurrent GI bleeding, cancer related pain, and bilateral pulmonary embolism diagnosed 06/06/2024, acute superficial venous thrombosis of cephalic vein of left upper arm, multiple sclerosis, rheumatoid arthritis on hydroxychloroquine , GERD, hypertension, depression, endometriosis, COPD, ADHD, anxiety.  She recently underwent 1.  Mesenteric arteriography and duodenal mass embolization 2. central venogram and IVC filter placement by IR on 06/06/2024.  She said that since her procedure, she has been having worsening abdominal pain despite taking the opioid analgesics at home.  She came to the hospital because of worsening abdominal pain.    Assessment and Plan:   Intractable abdominal pain secondary to metastatic ovarian cancer - Noted metastasis to duodenum, liver, colon.  S/p recent mesenteric arteriography and duodenal mass embolization 1/30 by IR.  Evaluated by GI, no intervention recommended.  Currently on long and short acting oxycodone , IV Dilaudid  as needed, although pain still not well-controlled.  Palliative care and oncology following closely.  Discussion about pursuing celiac plexus block with IR consult today.  Ongoing pain management likely to be adjusted by palliative/oncology.  Acute on chronic blood loss anemia - Hemoglobin 6.5 on admission.  S/p 1 unit PRBCs.  Hemoglobin this morning 7.9.  Etiology secondary to chronic bleeding from metastatic invasion of GI tract.  Per discussion with oncology yesterday, likely that patient will pursue ongoing outpatient iron /blood transfusions as a palliative measure.   Acute pulmonary embolism/acute left common femoral vein DVT/right great saphenous vein thrombosis - S/p IVC filter  placement 1/30 by IR.  Anticoagulation contraindicated due to recurrent GI bleeding and ovarian cancer metastasis to duodenum.   Hypokalemia - Replenishment on board.  Appears resolved now.  Will recheck BMP and magnesium in AM.   COPD - Nebulizers as needed.   Multiple sclerosis - Does not appear to be in acute exacerbation.   Hypoalbuminemia/peripheral edema - Encourage p.o. intake.  Protein calorie supplementation.   Goals of care - Palliative care following closely.  CODE STATUS changed to DNR/DNI.  Ongoing measures appear to be focused on pain control and palliative transfusions.  Does not want to pursue chemotherapy or any other interventions.   Subjective: Patient in bed, still having abdominal pain but states its slightly better than yesterday.  Discussion this morning about management of her pain therapy.  Previously done by neurology.  Discussed about the strategy to have her pain management taken over by palliative/oncology.  Patient was in agreement as they are likely to be more in the loop with her ongoing needs and diagnosis.  Patient currently denies any shortness of breath, chest pain.  Physical Exam:  Vitals:   06/11/24 2000 06/11/24 2352 06/12/24 0344 06/12/24 0851  BP: 107/69 124/74 118/70 110/68  Pulse: 99 (!) 102 96 98  Resp: 18 16 20 16   Temp: 99.5 F (37.5 C) 99.9 F (37.7 C) 100 F (37.8 C) 99.7 F (37.6 C)  TempSrc: Oral  Oral   SpO2: 100% 97% 94% 96%  Weight:      Height:        GENERAL:  Alert, pleasant, no acute distress  HEENT:  EOMI CARDIOVASCULAR:  RRR, no murmurs appreciated RESPIRATORY:  Clear to auscultation, no wheezing, rales, or rhonchi GASTROINTESTINAL: Diffusely mildly tender EXTREMITIES:  No LE edema bilaterally NEURO:  No  new focal deficits appreciated SKIN:  No rashes noted PSYCH:  Appropriate mood and affect, anxious   Data Reviewed:  Imaging Studies: CT Angio Abd/Pel W and/or Wo Contrast Result Date: 06/10/2024 CLINICAL  DATA:  Metastatic ovarian cancer with worsening abdominal pain EXAM: CTA ABDOMEN AND PELVIS WITHOUT AND WITH CONTRAST TECHNIQUE: Multidetector CT imaging of the abdomen and pelvis was performed using the standard protocol during bolus administration of intravenous contrast. Multiplanar reconstructed images and MIPs were obtained and reviewed to evaluate the vascular anatomy. RADIATION DOSE REDUCTION: This exam was performed according to the departmental dose-optimization program which includes automated exposure control, adjustment of the mA and/or kV according to patient size and/or use of iterative reconstruction technique. CONTRAST:  OMNIPAQUE  IOHEXOL  350 MG/ML SOLN COMPARISON:  CTA chest dated 06/06/2024, CTA abdomen and pelvis dated 06/05/2024 and multiple priors FINDINGS: VASCULAR Aorta: Normal caliber aorta without aneurysm, dissection, vasculitis or significant stenosis. Mild aortic atherosclerosis. Celiac: Patent without evidence of aneurysm, dissection, vasculitis or significant stenosis. Prior gastroduodenal artery embolization. SMA: Patent without evidence of aneurysm, dissection, vasculitis or significant stenosis. Renals: Single bilateral renal arteries. Both renal arteries are patent without evidence of aneurysm, dissection, vasculitis, fibromuscular dysplasia or significant stenosis. IMA: Patent without evidence of aneurysm, dissection, vasculitis or significant stenosis. Inflow: Mild narrowing of the bilateral internal iliac artery origins due to atherosclerotic plaque. Otherwise patent without evidence of aneurysm, dissection, vasculitis or significant stenosis. Proximal Outflow: Bilateral common femoral and visualized portions of the superficial and profunda femoral arteries are patent without evidence of aneurysm, dissection, vasculitis or significant stenosis. Veins: Interval placement of infrarenal IVC filter. Focal filling defect within the right great saphenous vein immediately distal  to the saphenofemoral junction (16:197), which appears new compared to 06/05/2024. Filling defect within the left common femoral vein (16:210). Review of the MIP images confirms the above findings. NON-VASCULAR Lower chest: No focal consolidation or pulmonary nodule in the lung bases. No pleural effusion or pneumothorax demonstrated. Partially imaged heart size is normal. Unchanged partially imaged bilateral lower lobe pulmonary emboli. Unchanged 3.4 x 2.5 cm right cardiophrenic nodule (16: 14). Hepatobiliary: Again seen are heterogeneously enhancing bilobar hepatic masses. Index lesions: -3.8 x 2.8 cm segment 7/8 (16:38) unchanged when remeasured -9.5 x 6.2 cm segment 4 (16:29), previously 8.7 x 6.6 cm -6.2 x 4.5 cm segment 2 (16:27), previously 5.4 x 4.2 cm No intra or extrahepatic biliary ductal dilation. Gallbladder is laterally displaced. Mild gallbladder mural thickening and pericholecystic free fluid. Pancreas: No focal lesions or main ductal dilation. Spleen: Normal in size without focal abnormality. Adrenals/Urinary Tract: Unchanged right adrenal nodule size measuring 2.9 x 2.1 cm (16:51) when remeasured. Nodule measures 23 HU precontrast. No left adrenal nodule. Duplex kidneys. No suspicious renal mass or hydronephrosis. Unchanged linear 7 mm calculus at the left ureterovesical junction (16:171). No focal bladder wall thickening. Stomach/Bowel: Normal appearance of the stomach. Again seen is heterogeneously enhancing mass within the right hemiabdomen centered at the proximal duodenum measuring 11.0 x 8.6 cm (16:78), previously 9.8 x 9.2 cm when measured similarly. The mass continues exerts local mass effect with lateral displacement of the gallbladder and continued effacement of the SMV portal confluence. Findings suspicious for interval invasion of the proximal transverse colon with marked luminal narrowing. Along the periphery of this mass, there are areas of irregular arterial hyperenhancement, for  example posteroinferior (9:110), which expands on the venous phase image (16:107). Appendix is not discretely seen. Lymphatic: Slightly increased size of right pelvic sidewall nodule measuring 1.6  x 1.4 cm (16:152), previously 1.5 x 1.2 cm. Unchanged 1.0 cm left para-aortic lymph node (16:86). Increased size of right lower quadrant nodule measuring 3.5 x 2.6 cm (16:130), previously 3 1 x 2.5 cm (remeasured). Reproductive: Status post hysterectomy and bilateral salpingo oophorectomy. Unchanged 2.0 x 1.8 cm nodular soft tissue focus, inseparable from the right vaginal cuff (16:163). Other: Trace right hemi abdominal free fluid. No fluid collection. Asymmetric thickening of the right anterior peritoneum adjacent to the duodenal mass. Musculoskeletal: No acute or abnormal lytic or blastic osseous lesions. Postsurgical changes of L4-S1 spinal fusion. Hardware appears intact. Subcutaneous soft tissue stranding and small focus of hyperdensity in the right gluteal region, likely postprocedural. Mild body wall edema. Small fat-containing paraumbilical hernia. IMPRESSION: 1. No acute aortic pathology. 2. Interval placement of infrarenal IVC filter. Filling defect within the right great saphenous vein immediately distal to the saphenofemoral femoral junction, which appears new compared to 06/05/2024, suspicious for thrombus. Filling defect within the left common femoral vein, consistent with known thrombus. 3. Unchanged partially imaged bilateral lower lobe pulmonary emboli. 4. Increased size of the heterogeneously enhancing mass within the right hemiabdomen centered at the proximal duodenum with findings suspicious for interval invasion of the proximal transverse colon resulting in marked luminal narrowing. Along the periphery of this mass, there are areas of irregular arterial hyperenhancement, which expands on the venous phase image, favored to represent areas of progressive tumoral enhancement, although areas of active  extravasation could have a similar appearance. 5. Bilobar hepatic metastases, many of which have increased in size. Increased size of right lower quadrant nodule and right pelvic sidewall nodule. 6. Unchanged right cardiophrenic and adrenal nodules and left para-aortic lymph node. 7. Unchanged linear 7 mm calculus at the left ureterovesical junction. No hydronephrosis. 8.  Aortic Atherosclerosis (ICD10-I70.0). Electronically Signed   By: Limin  Xu M.D.   On: 06/10/2024 15:48   DG Chest Portable 1 View Result Date: 06/10/2024 CLINICAL DATA:  Shortness of breath EXAM: PORTABLE CHEST 1 VIEW COMPARISON:  June 04, 2024 FINDINGS: The heart size and mediastinal contours are within normal limits. Right internal jugular Port-A-Cath is unchanged. Both lungs are clear. The visualized skeletal structures are unremarkable. IMPRESSION: No active disease. Electronically Signed   By: Lynwood Landy Raddle M.D.   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 EXAM: Title; COIL EMBOLIZATION OF GASTRODUODENAL ARTERY Listed procedures; 1.  ULTRASOUND-GUIDED RIGHT COMMON FEMORAL ARTERY ACCESS 2. MESENTERIC ARTERIOGRAPHY, including CELIAC, SUPERIOR MESENTERIC ARTERY and GASTRODUODENAL ARTERIOGRAMS 3. COIL EMBOLIZATION of the GASTRODUODENAL ARTERY 4. GELFOAM EMBOLIZATION of SUPERIOR MESENTERIC ARTERY BRANCHES COMPARISON:  CT AP, 06/05/2024. MEDICATIONS: 2 g cefoxitin  IV. The antibiotics were administered within 60 minutes of procedural initiation. 4 mg Zofran  IV.  50 mg albumin  IV ANESTHESIA/SEDATION: Moderate (conscious) sedation was employed during this procedure. A total of Versed  5 mg and Fentanyl  250 mcg was administered intravenously. Moderate Sedation Time: 90 minutes. The patient's level of consciousness and vital signs were monitored continuously by radiology  nursing throughout the procedure under my direct supervision. CONTRAST:  140 mL Omnipaque  300 FLUOROSCOPY: Radiation Exposure Index and estimated peak skin dose (PSD); Reference air kerma (RAK), 429.3 mGy. COMPLICATIONS: None immediate. PROCEDURE: Informed consent was obtained from the patient and/or patient's representative following explanation of the procedure, risks, benefits and alternatives. All questions were addressed. A time out was performed  prior to the initiation of the procedure. Maximal barrier sterile technique utilized including caps, mask, sterile gowns, sterile gloves, large sterile drape, hand hygiene, and chlorhexidine  prep. The RIGHT femoral head was marked fluoroscopically. Under sterile conditions and local anesthesia, the RIGHT common femoral artery access was performed with a micropuncture needle. Under direct ultrasound guidance, the RIGHT common femoral was accessed with a micropuncture kit. An ultrasound image was saved for documentation purposes. This allowed for placement of a 6 Fr 35 cm vascular sheath. A limited arteriogram was performed through the side arm of the sheath confirming appropriate access within the RIGHT common femoral artery. A limited abdominal aortogram was performed to help identify the celiac artery origin. Over a Bentson wire, a C2 catheter was advanced, back bled and flushed. The catheter was then utilized to select the celiac access, then advanced into the common hepatic then gastroduodenal arteries. Selective mesenteric arteriograms were performed at each level. Using a 2.8 Fr Progreat microcatheter and 0.016 inch Fathom microwire access into the gastroduodenal artery was performed and a selective arteriogram was performed. Selective embolization with multiple 0.018 inch micro coils was performed. The microcatheter was removed and arteriogram with the 5 Fr catheter at the common hepatic artery was performed. Adequate pruning of the GDA was achieved on post  embolization arteriogram. The C2 catheter was replaced for a Sos catheter, which was then used to select the superior mesenteric artery. Additional selective arteriogram was performed and access into the branches of the SMA, including the inferior pancreaticoduodenal and middle colic artery was obtained with the microcatheter. Gel-Foam embolization was then performed to near-stasis. Post embolization arteriogram demonstrated adequate pruning of the SMA branches supplying the tumor. Images were reviewed and the procedure was terminated. All wires, catheters and sheaths were removed from the patient. Hemostasis was achieved at the RIGHT groin access site with Angio-Seal closure. The patient tolerated the procedure well without immediate post procedural complication. FINDINGS: *Celiac and superior mesenteric arteriograms with normal order and branching. *Multivessel arterial supply to the large duodenal tumor, from the GDA and branches off the SMA, including the inferior pancreaticoduodenal and middle colic arteries. Tumor blush was identified, however no active extravasation was noted. *Coil embolization of the GDA, with additional Gelfoam embolization of the aforementioned SMA branches, with adequate pruning of the vasculature. IMPRESSION: 1. Mesenteric arteriography for duodenal mass, with arterial supply from the celiac axis at the GDA, and the SMA at the inferior pancreaticoduodenal and middle colic arteries. Tumor blush without evidence of active extravasation from the duodenal mass. 2. Successful coil embolization of the gastroduodenal artery, and Gelfoam embolization of the SMA branches without significant residual tumoral enhancement. Thom Hall, MD [facility] Electronically Signed   By: Thom Hall M.D.   On: 06/09/2024 14:52   IR Angiogram Visceral Selective Result Date: 06/09/2024 INDICATION: duodenal mass - bleeding Briefly, 69 year old female  with a history of metastatic ovarian cancer and bleeding duodenal metastasis. EXAM: Title; COIL EMBOLIZATION OF GASTRODUODENAL ARTERY Listed procedures; 1.  ULTRASOUND-GUIDED RIGHT COMMON FEMORAL ARTERY ACCESS 2. MESENTERIC ARTERIOGRAPHY, including CELIAC, SUPERIOR MESENTERIC ARTERY and GASTRODUODENAL ARTERIOGRAMS 3. COIL EMBOLIZATION of the GASTRODUODENAL ARTERY 4. GELFOAM EMBOLIZATION of SUPERIOR MESENTERIC ARTERY BRANCHES COMPARISON:  CT AP, 06/05/2024. MEDICATIONS: 2 g cefoxitin  IV. The antibiotics were administered within 60 minutes of procedural initiation. 4 mg Zofran  IV.  50 mg albumin  IV ANESTHESIA/SEDATION: Moderate (conscious) sedation was employed during this procedure. A total of Versed  5 mg and Fentanyl  250 mcg was administered  intravenously. Moderate Sedation Time: 90 minutes. The patient's level of consciousness and vital signs were monitored continuously by radiology nursing throughout the procedure under my direct supervision. CONTRAST:  140 mL Omnipaque  300 FLUOROSCOPY: Radiation Exposure Index and estimated peak skin dose (PSD); Reference air kerma (RAK), 429.3 mGy. COMPLICATIONS: None immediate. PROCEDURE: Informed consent was obtained from the patient and/or patient's representative following explanation of the procedure, risks, benefits and alternatives. All questions were addressed. A time out was performed prior to the initiation of the procedure. Maximal barrier sterile technique utilized including caps, mask, sterile gowns, sterile gloves, large sterile drape, hand hygiene, and chlorhexidine  prep. The RIGHT femoral head was marked fluoroscopically. Under sterile conditions and local anesthesia, the RIGHT common femoral artery access was performed with a micropuncture needle. Under direct ultrasound guidance, the RIGHT common femoral was accessed with a micropuncture kit. An ultrasound image was saved for documentation purposes. This allowed for placement of a 6 Fr 35 cm vascular sheath. A  limited arteriogram was performed through the side arm of the sheath confirming appropriate access within the RIGHT common femoral artery. A limited abdominal aortogram was performed to help identify the celiac artery origin. Over a Bentson wire, a C2 catheter was advanced, back bled and flushed. The catheter was then utilized to select the celiac access, then advanced into the common hepatic then gastroduodenal arteries. Selective mesenteric arteriograms were performed at each level. Using a 2.8 Fr Progreat microcatheter and 0.016 inch Fathom microwire access into the gastroduodenal artery was performed and a selective arteriogram was performed. Selective embolization with multiple 0.018 inch micro coils was performed. The microcatheter was removed and arteriogram with the 5 Fr catheter at the common hepatic artery was performed. Adequate pruning of the GDA was achieved on post embolization arteriogram. The C2 catheter was replaced for a Sos catheter, which was then used to select the superior mesenteric artery. Additional selective arteriogram was performed and access into the branches of the SMA, including the inferior pancreaticoduodenal and middle colic artery was obtained with the microcatheter. Gel-Foam embolization was then performed to near-stasis. Post embolization arteriogram demonstrated adequate pruning of the SMA branches supplying the tumor. Images were reviewed and the procedure was terminated. All wires, catheters and sheaths were removed from the patient. Hemostasis was achieved at the RIGHT groin access site with Angio-Seal closure. The patient tolerated the procedure well without immediate post procedural complication. FINDINGS: *Celiac and superior mesenteric arteriograms with normal order and branching. *Multivessel arterial supply to the large duodenal tumor, from the GDA and branches off the SMA, including the inferior pancreaticoduodenal and middle colic arteries. Tumor blush was identified,  however no active extravasation was noted. *Coil embolization of the GDA, with additional Gelfoam embolization of the aforementioned SMA branches, with adequate pruning of the vasculature. IMPRESSION: 1. Mesenteric arteriography for duodenal mass, with arterial supply from the celiac axis at the GDA, and the SMA at the inferior pancreaticoduodenal and middle colic arteries. Tumor blush without evidence of active extravasation from the duodenal mass. 2. Successful coil embolization of the gastroduodenal artery, and Gelfoam embolization of the SMA branches without significant residual tumoral enhancement. Thom Hall, MD [facility] Electronically Signed   By: Thom Hall M.D.   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 EXAM: Title; COIL EMBOLIZATION OF GASTRODUODENAL ARTERY Listed procedures; 1.  ULTRASOUND-GUIDED RIGHT COMMON FEMORAL ARTERY ACCESS 2. MESENTERIC ARTERIOGRAPHY, including CELIAC, SUPERIOR MESENTERIC ARTERY and GASTRODUODENAL ARTERIOGRAMS 3. COIL EMBOLIZATION of the GASTRODUODENAL ARTERY 4. GELFOAM EMBOLIZATION of SUPERIOR MESENTERIC ARTERY BRANCHES COMPARISON:  CT AP, 06/05/2024. MEDICATIONS: 2 g cefoxitin  IV. The antibiotics were administered within 60 minutes of procedural initiation. 4 mg Zofran  IV.  50 mg albumin  IV ANESTHESIA/SEDATION: Moderate (conscious) sedation was employed during this procedure. A total of Versed  5 mg and Fentanyl  250 mcg was administered intravenously. Moderate Sedation Time: 90 minutes. The patient's level of consciousness and vital signs were monitored continuously by radiology nursing throughout the procedure under my direct supervision. CONTRAST:  140 mL Omnipaque  300 FLUOROSCOPY: Radiation Exposure Index and estimated  peak skin dose (PSD); Reference air kerma (RAK), 429.3 mGy. COMPLICATIONS: None immediate. PROCEDURE: Informed consent was obtained from the patient and/or patient's representative following explanation of the procedure, risks, benefits and alternatives. All questions were addressed. A time out was performed prior to the initiation of the procedure. Maximal barrier sterile technique utilized including caps, mask, sterile gowns, sterile gloves, large sterile drape, hand hygiene, and chlorhexidine  prep. The RIGHT femoral head was marked fluoroscopically. Under sterile conditions and local anesthesia, the RIGHT common femoral artery access was performed with a micropuncture needle. Under direct ultrasound guidance, the RIGHT common femoral was accessed with a micropuncture kit. An ultrasound image was saved for documentation purposes. This allowed for placement of a 6 Fr 35 cm vascular sheath. A limited arteriogram was performed through the side arm of the sheath confirming appropriate access within the RIGHT common femoral artery. A limited abdominal aortogram was performed to help identify the celiac artery origin. Over a Bentson wire, a C2 catheter was advanced, back bled and flushed. The catheter was then utilized to select the celiac access, then advanced into the common hepatic then gastroduodenal arteries. Selective mesenteric arteriograms were performed at each level. Using a 2.8 Fr Progreat microcatheter and 0.016 inch Fathom microwire access into the gastroduodenal artery was performed and a selective arteriogram was performed. Selective embolization with multiple 0.018 inch micro coils was performed. The microcatheter was removed and arteriogram with the 5 Fr catheter at the common hepatic artery was performed. Adequate pruning of the GDA was achieved on post embolization arteriogram. The C2 catheter was replaced for a Sos catheter, which was then used to select the superior mesenteric artery. Additional  selective arteriogram was performed and access into the branches of the SMA, including the inferior pancreaticoduodenal and middle colic artery was obtained with the microcatheter. Gel-Foam embolization was then performed to near-stasis. Post embolization arteriogram demonstrated adequate pruning of the SMA branches supplying the tumor. Images were reviewed and the procedure was terminated. All wires, catheters and sheaths were removed from the patient. Hemostasis was achieved at the RIGHT groin access site with Angio-Seal closure. The patient tolerated the procedure well without immediate post procedural complication. FINDINGS: *Celiac and superior mesenteric arteriograms with normal order and branching. *Multivessel arterial supply to the large duodenal tumor, from the GDA and branches off the SMA, including the inferior pancreaticoduodenal and middle colic arteries. Tumor blush was identified, however no active extravasation was noted. *Coil embolization of the GDA, with additional Gelfoam embolization of the aforementioned SMA branches, with adequate pruning of the vasculature. IMPRESSION: 1. Mesenteric arteriography for duodenal mass, with arterial supply from the celiac axis at the GDA, and the SMA at the inferior pancreaticoduodenal and middle colic arteries. Tumor blush without evidence of active extravasation from the duodenal mass. 2. Successful  coil embolization of the gastroduodenal artery, and Gelfoam embolization of the SMA branches without significant residual tumoral enhancement. Thom Hall, MD [facility] Radiology Electronically Signed   By: Thom Hall M.D.   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 EXAM: Title; COIL EMBOLIZATION OF GASTRODUODENAL ARTERY  Listed procedures; 1.  ULTRASOUND-GUIDED RIGHT COMMON FEMORAL ARTERY ACCESS 2. MESENTERIC ARTERIOGRAPHY, including CELIAC, SUPERIOR MESENTERIC ARTERY and GASTRODUODENAL ARTERIOGRAMS 3. COIL EMBOLIZATION of the GASTRODUODENAL ARTERY 4. GELFOAM EMBOLIZATION of SUPERIOR MESENTERIC ARTERY BRANCHES COMPARISON:  CT AP, 06/05/2024. MEDICATIONS: 2 g cefoxitin  IV. The antibiotics were administered within 60 minutes of procedural initiation. 4 mg Zofran  IV.  50 mg albumin  IV ANESTHESIA/SEDATION: Moderate (conscious) sedation was employed during this procedure. A total of Versed  5 mg and Fentanyl  250 mcg was administered intravenously. Moderate Sedation Time: 90 minutes. The patient's level of consciousness and vital signs were monitored continuously by radiology nursing throughout the procedure under my direct supervision. CONTRAST:  140 mL Omnipaque  300 FLUOROSCOPY: Radiation Exposure Index and estimated peak skin dose (PSD); Reference air kerma (RAK), 429.3 mGy. COMPLICATIONS: None immediate. PROCEDURE: Informed consent was obtained from the patient and/or patient's representative following explanation of the procedure, risks, benefits and alternatives. All questions were addressed. A time out was performed prior to the initiation of the procedure. Maximal barrier sterile technique utilized including caps, mask, sterile gowns, sterile gloves, large sterile drape, hand hygiene, and chlorhexidine  prep. The RIGHT femoral head was marked fluoroscopically. Under sterile conditions and local anesthesia, the RIGHT common femoral artery access was performed with a micropuncture needle. Under direct ultrasound guidance, the RIGHT common femoral was accessed with a micropuncture kit. An ultrasound image was saved for documentation purposes. This allowed for placement of a 6 Fr 35 cm vascular sheath. A limited arteriogram was performed through the side arm of the sheath confirming appropriate access within the RIGHT common femoral  artery. A limited abdominal aortogram was performed to help identify the celiac artery origin. Over a Bentson wire, a C2 catheter was advanced, back bled and flushed. The catheter was then utilized to select the celiac access, then advanced into the common hepatic then gastroduodenal arteries. Selective mesenteric arteriograms were performed at each level. Using a 2.8 Fr Progreat microcatheter and 0.016 inch Fathom microwire access into the gastroduodenal artery was performed and a selective arteriogram was performed. Selective embolization with multiple 0.018 inch micro coils was performed. The microcatheter was removed and arteriogram with the 5 Fr catheter at the common hepatic artery was performed. Adequate pruning of the GDA was achieved on post embolization arteriogram. The C2 catheter was replaced for a Sos catheter, which was then used to select the superior mesenteric artery. Additional selective arteriogram was performed and access into the branches of the SMA, including the inferior pancreaticoduodenal and middle colic artery was obtained with the microcatheter. Gel-Foam embolization was then performed to near-stasis. Post embolization arteriogram demonstrated adequate pruning of the SMA branches supplying the tumor. Images were reviewed and the procedure was terminated. All wires, catheters and sheaths were removed from the patient. Hemostasis was achieved at the RIGHT groin access site with Angio-Seal closure. The patient tolerated the procedure well without immediate post procedural complication. FINDINGS: *Celiac and superior mesenteric arteriograms with normal order and branching. *Multivessel arterial supply to the large duodenal tumor, from the GDA and  branches off the SMA, including the inferior pancreaticoduodenal and middle colic arteries. Tumor blush was identified, however no active extravasation was noted. *Coil embolization of the GDA, with additional Gelfoam embolization of the  aforementioned SMA branches, with adequate pruning of the vasculature. IMPRESSION: 1. Mesenteric arteriography for duodenal mass, with arterial supply from the celiac axis at the GDA, and the SMA at the inferior pancreaticoduodenal and middle colic arteries. Tumor blush without evidence of active extravasation from the duodenal mass. 2. Successful coil embolization of the gastroduodenal artery, and Gelfoam embolization of the SMA branches without significant residual tumoral enhancement. Thom Hall, MD [facility] Radiology Electronically Signed   By: Thom Hall M.D.   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 EXAM: Title; COIL EMBOLIZATION OF GASTRODUODENAL ARTERY Listed procedures; 1.  ULTRASOUND-GUIDED RIGHT COMMON FEMORAL ARTERY ACCESS 2. MESENTERIC ARTERIOGRAPHY, including CELIAC, SUPERIOR MESENTERIC ARTERY and GASTRODUODENAL ARTERIOGRAMS 3. COIL EMBOLIZATION of the GASTRODUODENAL ARTERY 4. GELFOAM EMBOLIZATION of SUPERIOR MESENTERIC ARTERY BRANCHES COMPARISON:  CT AP, 06/05/2024. MEDICATIONS: 2 g cefoxitin  IV. The antibiotics were administered within 60 minutes of procedural initiation. 4 mg Zofran  IV.  50 mg albumin  IV ANESTHESIA/SEDATION: Moderate (conscious) sedation was employed during this procedure. A total of Versed  5 mg and Fentanyl  250 mcg was administered intravenously. Moderate Sedation Time: 90 minutes. The patient's level of consciousness and vital signs were monitored continuously by radiology nursing throughout the procedure under my direct supervision. CONTRAST:  140 mL Omnipaque  300 FLUOROSCOPY: Radiation Exposure Index and estimated peak skin dose (PSD); Reference air kerma (RAK), 429.3 mGy. COMPLICATIONS: None immediate. PROCEDURE:  Informed consent was obtained from the patient and/or patient's representative following explanation of the procedure, risks, benefits and alternatives. All questions were addressed. A time out was performed prior to the initiation of the procedure. Maximal barrier sterile technique utilized including caps, mask, sterile gowns, sterile gloves, large sterile drape, hand hygiene, and chlorhexidine  prep. The RIGHT femoral head was marked fluoroscopically. Under sterile conditions and local anesthesia, the RIGHT common femoral artery access was performed with a micropuncture needle. Under direct ultrasound guidance, the RIGHT common femoral was accessed with a micropuncture kit. An ultrasound image was saved for documentation purposes. This allowed for placement of a 6 Fr 35 cm vascular sheath. A limited arteriogram was performed through the side arm of the sheath confirming appropriate access within the RIGHT common femoral artery. A limited abdominal aortogram was performed to help identify the celiac artery origin. Over a Bentson wire, a C2 catheter was advanced, back bled and flushed. The catheter was then utilized to select the celiac access, then advanced into the common hepatic then gastroduodenal arteries. Selective mesenteric arteriograms were performed at each level. Using a 2.8 Fr Progreat microcatheter and 0.016 inch Fathom microwire access into the gastroduodenal artery was performed and a selective arteriogram was performed. Selective embolization with multiple 0.018 inch micro coils was performed. The microcatheter was removed and arteriogram with the 5 Fr catheter at the common hepatic artery was performed. Adequate pruning of the GDA was achieved on post embolization arteriogram. The C2 catheter was replaced for a Sos catheter, which was then used to select the superior mesenteric artery. Additional selective arteriogram was performed and access into the branches of the SMA, including the inferior  pancreaticoduodenal and middle colic artery was obtained  with the microcatheter. Gel-Foam embolization was then performed to near-stasis. Post embolization arteriogram demonstrated adequate pruning of the SMA branches supplying the tumor. Images were reviewed and the procedure was terminated. All wires, catheters and sheaths were removed from the patient. Hemostasis was achieved at the RIGHT groin access site with Angio-Seal closure. The patient tolerated the procedure well without immediate post procedural complication. FINDINGS: *Celiac and superior mesenteric arteriograms with normal order and branching. *Multivessel arterial supply to the large duodenal tumor, from the GDA and branches off the SMA, including the inferior pancreaticoduodenal and middle colic arteries. Tumor blush was identified, however no active extravasation was noted. *Coil embolization of the GDA, with additional Gelfoam embolization of the aforementioned SMA branches, with adequate pruning of the vasculature. IMPRESSION: 1. Mesenteric arteriography for duodenal mass, with arterial supply from the celiac axis at the GDA, and the SMA at the inferior pancreaticoduodenal and middle colic arteries. Tumor blush without evidence of active extravasation from the duodenal mass. 2. Successful coil embolization of the gastroduodenal artery, and Gelfoam embolization of the SMA branches without significant residual tumoral enhancement. Thom Hall, MD [facility] Radiology Electronically Signed   By: Thom Hall M.D.   On: 06/09/2024 14:52   VAS US  LOWER EXTREMITY VENOUS (DVT) Result Date: 06/09/2024  Lower Venous DVT Study Patient Name:  ELIESE KERWOOD  Date of Exam:   06/06/2024 Medical Rec #: [id number]        Accession #:    [id number] Date of Birth: 1955/10/11       Patient Gender: F Patient Age:   81 years Exam Location:  [facility] [place] Procedure:      VAS US  LOWER EXTREMITY VENOUS (DVT)  Referring Phys: THOM HALL --------------------------------------------------------------------------------  Indications: Incidental pulmonary embolism on abdominal CTA 06/05/24 Other Indications: Status post mesenteric arteriography and duodenal mass                    embolization with IVC filter placement 06/06/24. 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 Limitations: Bandage right groin. Comparison Study: Prior negative left LEV done 05/29/22 Performing Technologist: Alberta Lis RVS  Examination Guidelines: A complete evaluation includes B-mode imaging, spectral Doppler, color Doppler, and power Doppler as needed of all accessible portions of each vessel. Bilateral testing is considered an integral part of a complete examination. Limited examinations for reoccurring indications may be performed as noted. The reflux portion of the exam is performed with the patient in reverse Trendelenburg.  +---------+---------------+---------+-----------+----------+--------------+ RIGHT    CompressibilityPhasicitySpontaneityPropertiesThrombus Aging +---------+---------------+---------+-----------+----------+--------------+ CFV      Full           Yes      No                                  +---------+---------------+---------+-----------+----------+--------------+ SFJ      Full                                                        +---------+---------------+---------+-----------+----------+--------------+ FV Prox  Full           Yes      No                                  +---------+---------------+---------+-----------+----------+--------------+  FV Mid   Full                                                        +---------+---------------+---------+-----------+----------+--------------+ FV DistalFull                                                        +---------+---------------+---------+-----------+----------+--------------+ PFV       Full           Yes      No                                  +---------+---------------+---------+-----------+----------+--------------+ POP      Full           Yes      No                                  +---------+---------------+---------+-----------+----------+--------------+ PTV      Full                                                        +---------+---------------+---------+-----------+----------+--------------+ PERO     Full                                                        +---------+---------------+---------+-----------+----------+--------------+   +---------+---------------+---------+-----------+----------+--------------+ LEFT     CompressibilityPhasicitySpontaneityPropertiesThrombus Aging +---------+---------------+---------+-----------+----------+--------------+ CFV      Full           Yes      No                                  +---------+---------------+---------+-----------+----------+--------------+ SFJ      Full                                                        +---------+---------------+---------+-----------+----------+--------------+ FV Prox  None           No       No                   Acute          +---------+---------------+---------+-----------+----------+--------------+ FV Mid   None           No       No                   Acute          +---------+---------------+---------+-----------+----------+--------------+ FV DistalNone  No       No                   Acute          +---------+---------------+---------+-----------+----------+--------------+ PFV      Full           Yes      No                                  +---------+---------------+---------+-----------+----------+--------------+ POP      None           No       No                   Acute          +---------+---------------+---------+-----------+----------+--------------+ PTV      None                                          Acute          +---------+---------------+---------+-----------+----------+--------------+ PERO     None                                         Acute          +---------+---------------+---------+-----------+----------+--------------+     Summary: RIGHT: - There is no evidence of deep vein thrombosis in the lower extremity.  - No cystic structure found in the popliteal fossa.  LEFT: - Findings consistent with acute deep vein thrombosis involving the left femoral vein, left popliteal vein, left posterior tibial veins, and left peroneal veins.  - No cystic structure found in the popliteal fossa.  *See table(s) above for measurements and observations. Electronically signed by Gaile New MD on 06/09/2024 at 10:45:02 AM.    Final    IR IVC FILTER PLMT / S&I PORTER GUID/MOD SED Result Date: 06/07/2024 INDICATION: duodenal mass Briefly, 69 year old female with a history of metastatic ovarian cancer, bleeding duodenal metastasis, and new, incidental PE and LEFT lower extremity DVT. Unable to anticoagulate. EXAM: Title; INFERIOR VENA CAVA (IVC) FILTER PLACEMENT Listed procedures; 1. ULTRASOUND-GUIDED RIGHT GREATER SAPHENOUS VEIN ACCESS 2. CENTRAL VENOGRAM 3. INFERIOR VENA CAVA FILTER PLACEMENT MEDICATIONS: None. ANESTHESIA/SEDATION: Moderate (conscious) sedation was employed during this procedure. A total of Versed  1 mg and Fentanyl  50 mcg was administered intravenously. Moderate Sedation Time: 18 minutes. The patient's level of consciousness and vital signs were monitored continuously by radiology nursing throughout the procedure under my direct supervision. CONTRAST:  60 mL Omnipaque  300 FLUOROSCOPY: Radiation Exposure Index and estimated peak skin dose (PSD); Reference air kerma (RAK), 111.9 mGy. COMPLICATIONS: None immediate. PROCEDURE: Informed written consent was obtained from the patient and/or patient's representative following explanation of the procedure, risks, benefits and alternatives. A time out  was performed prior to the initiation of the procedure. Maximal barrier sterile technique utilized including caps, mask, sterile gowns, sterile gloves, large sterile drape, hand hygiene, and sterile prep. Under sterile condition and local anesthesia, RIGHT greater saphenous vein access was performed with ultrasound. An ultrasound image was saved and sent to PACS. Over a guidewire, the IVC filter delivery sheath and inner dilator were advanced into the IVC just above the IVC bifurcation. Contrast injection was performed  for an IVC venogram. Through the delivery sheath, a retrievable Denali IVC filter was deployed below the level of the renal veins and above the IVC bifurcation. Limited post deployment venacavagram was performed. The delivery sheath was removed and hemostasis was obtained with manual compression. A dressing was placed. The patient tolerated the procedure well without immediate post procedural complication. FINDINGS: The IVC is patent. No evidence of thrombus, stenosis, or occlusion. No variant venous anatomy. IMPRESSION: Successful placement of a retrievable infrarenal inferior vena cava (IVC) filter PLAN: IVC filters can cause complications when left in place for extended periods of time. If medically appropriate, recommend discontinuing filter prior to discharge. Please re-evaluate the patient for filter discontinuation when they are seen in follow up, and refer patient to Interventional Radiology for removal. Thom Hall, MD [facility] Inter[facility]iology Special[facility] Radiology Electronically Signed   By: Thom Hall M.D.   On: 06/07/2024 07:44   CT Angio Chest Pulmonary Embolism (PE) W or WO Contrast Result Date: 06/06/2024 CLINICAL DATA:  Acute pulmonary embolism, unspecified pulmonary embolism type, unspecified whether acute cor pulmonale present. Pulmonary embolism seen on CTA from 06/05/2024. EXAM: CT ANGIOGRAPHY CHEST WITH CONTRAST TECHNIQUE: Multidetector CT imaging of  the chest was performed using the standard protocol during bolus administration of intravenous contrast. Multiplanar CT image reconstructions and MIPs were obtained to evaluate the vascular anatomy. RADIATION DOSE REDUCTION: This exam was performed according to the departmental dose-optimization program which includes automated exposure control, adjustment of the mA and/or kV according to patient size and/or use of iterative reconstruction technique. CONTRAST:  75mL OMNIPAQUE  IOHEXOL  350 MG/ML SOLN COMPARISON:  CTA abdomen pelvis 06/05/2024 FINDINGS: Cardiovascular: Positive for bilateral pulmonary emboli. Moderate clot burden with predominantly nonocclusive clot bilaterally. There is a small amount of clot in the distal right pulmonary artery extending into the truncus anterior and right interlobar artery. Clot extending into right segmental pulmonary arteries. Clot in the left lobar and segmental pulmonary arteries. No evidence for right heart strain. Normal caliber of the thoracic aorta. Arch great vessels are patent. Heart size is normal. No significant pericardial effusion. Embolization coils involving the gastroduodenal artery. Mediastinum/Nodes: Again noted is a soft tissue mass in the right anterior cardiophrenic fat measuring up to 3.4 cm. No significant mediastinal or hilar lymph node enlargement. No axillary lymph enlargement. Lungs/Pleura: Trachea and mainstem bronchi are patent. No pleural effusions. Small parenchymal densities or airspace densities in the medial right upper lobe on image 51, sequence 7 are new since 2023. Findings could be associated with infarct versus small area of airspace disease. 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 These findings are poorly characterized on this chest CT. Right adrenal nodule. Musculoskeletal: No acute bone abnormality. Review of the MIP images confirms the above findings. IMPRESSION: 1. Positive  for bilateral pulmonary emboli with moderate clot burden. Pulmonary emboli are predominantly nonocclusive and findings could be subacute. No evidence for right heart strain. 2. Parenchymal densities in the medial right lung could represent a small infarct versus small area of infection/inflammation. 3. Neoplastic disease in the upper abdomen. These results were called by telephone at the time of interpretation on 06/06/2024 at 2:04 pm to provider Dr. Hall, Who verbally acknowledged these results. Electronically Signed   By: Juliene Balder M.D.   On: 06/06/2024 14:09   CT Angio Abd/Pel w/ and/or w/o Result Date: 06/06/2024 EXAM: CTA ABDOMEN AND PELVIS WITHOUT AND WITH CONTRAST 06/05/2024 02:49:17 PM TECHNIQUE: CTA  images of the abdomen and pelvis without and with intravenous contrast. 125 mL (iopamidol  (ISOVUE -370) 76 % injection 125 mL IOPAMIDOL  (ISOVUE -370) INJECTION 76%) was administered. Three-dimensional MIP/volume rendered formations were performed. Automated exposure control, iterative reconstruction, and/or weight based adjustment of the mA/kV was utilized to reduce the radiation dose to as low as reasonably achievable. COMPARISON: Stable compared with the previous exam where applicable. New from prior exam where applicable. CLINICAL HISTORY: FINDINGS: VASCULATURE: AORTA: Aortic atherosclerotic calcification. No acute finding. No abdominal aortic aneurysm. No dissection. CELIAC TRUNK: No acute finding. No occlusion or significant stenosis. SUPERIOR MESENTERIC ARTERY: No acute finding. No occlusion or significant stenosis. RENAL ARTERIES: No acute finding. No occlusion or significant stenosis. ILIAC ARTERIES: No acute finding. No occlusion or significant stenosis. LIVER: Bilobar liver metastases. Index lesion within segment 7 measures 2.9 x 2.4 cm, axial image 24/7. Index lesion within segment 4a measures 8.7 x 6.6 cm, image 16/7. The index lesion within segment 2 measures 5.4 x 4.2 cm, axial image 16/7.  GALLBLADDER AND BILE DUCTS: Circumferential gallbladder wall thickening of the gallbladder measures 4 mm. No biliary ductal dilatation. SPLEEN: The spleen is within normal limits in size and appearance. PANCREAS: The pancreas is normal in size and contour without focal lesion or ductal dilatation. ADRENAL GLANDS: There is a right adrenal nodule measuring 2.1 x 1.8 cm and 9 Hounsfield units, which is stable compared with the previous exam. KIDNEYS, URETERS AND BLADDER: Persistent stone within the urinary bladder near the expected location of the left UVJ measuring 7 mm in length, image 75/7. There is no hydronephrosis identified. No stones in the kidneys or ureters. No perinephric or periureteral stranding. 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 This measures 10.7 x 8.9 x 7.3 cm. On the previous exam this measured 7.2 x 7.5 x 6.9 cm. Mass effect on the patent extrahepatic portal vein is again noted. On today's exam the mass extends up and abuts the surface of the gallbladder with loss of a fat plane between these two structures. The mass also extends up to the serosal surface of the transverse colon with loss of a fat plane between these two structures as well. There is no pathologic dilatation of the bowel loops to indicate a mechanical obstruction. There is a moderate to large stool burden identified within the colon. No signs of active disease. No signs of intraluminal contrast extravasation to suggest active GI bleeding. REPRODUCTIVE: The uterus appears surgically absent. PERITONEUM AND RETRPERITONEUM: Signs of peritoneal metastases. Index a lesion within the right lower quadrant measures 3.1 cm (image 57/7). New from prior exam. Along the right parapelvic sidewall, there is a lesion measuring 1.2 cm, image 64/7. Midline periumbilical hernia contains fat only. No ascites or free air. LUNG BASE:  Filling defect within a segmental branch of the right lower lobe pulmonary artery, compatible with incidental pulmonary embolus, axial image 2/5. LYMPH NODES: Abdominal adenopathy is identified. The index left retroperitoneal node measures 1.1 cm, image 39/7. On the previous exam, this measured 7 mm. BONES AND SOFT TISSUES: Postoperative changes noted within the lumbar spine. No acute or suspicious osseous findings. No acute soft tissue abnormality. IMPRESSION: 1. No evidence of active gastrointestinal bleeding on this CTA abdomen and pelvis. 2. Incidental pulmonary embolism identified within a segmental branch of the right lower lobe pulmonary artery. 3. 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 4. Bilobar hepatic metastatic disease.  New from prior. 5. Progressive abdominal nodal metastatic disease. 6. Peritoneal metastatic disease, new from prior. Electronically signed by: Waddell Calk MD 06/06/2024 07:36 AM EST RP Workstation: [id number]   DG Chest Portable 1 View Result Date: 06/04/2024 EXAM: 1 VIEW(S) XRAY OF THE CHEST 06/04/2024 04:19:00 PM COMPARISON: 05/09/2024 CLINICAL HISTORY: Fever. FINDINGS: LINES, TUBES AND DEVICES: Right chest power injectable port in place with tip at the lower SVC. LUNGS AND PLEURA: Panlike density favoring atelectasis along the left hemidiaphragm. No pleural effusion. No pneumothorax. HEART AND MEDIASTINUM: No acute abnormality of the cardiac and mediastinal silhouettes. BONES AND SOFT TISSUES: No acute osseous abnormality. JOINTS: Mild degenerative glenohumeral arthropathy bilaterally. IMPRESSION: 1. Mild left basilar atelectasis along the left hemidiaphragm. 2. Right chest power-injectable port with tip in the lower SVC. 3. Mild bilateral glenohumeral degenerative arthropathy. Electronically signed by: Ryan Salvage MD 06/04/2024 04:52 PM EST  RP Workstation: [id number]   IR Radiologist Eval & Mgmt Result Date: 05/27/2024 EXAM: NEW PATIENT OFFICE VISIT CHIEF COMPLAINT: See below HISTORY OF PRESENT ILLNESS: See below REVIEW OF SYSTEMS: See below PHYSICAL EXAMINATION: See below ASSESSMENT AND PLAN: Please refer to completed note in the electronic medical record on [facility] Thom Hall, MD [facility] Electronically Signed   By: Thom Hall M.D.   On: 05/27/2024 09:02    There are no new results to review at this time.  Previous records (including but not limited to H&P, progress notes, nursing notes, TOC management) were reviewed in assessment of this patient.  Labs: CBC: Recent Labs  Lab 06/06/24 0752 06/10/24 1237 06/11/24 0602 06/12/24 0532  WBC 7.2 8.6 7.6 10.0  HGB 8.6* 6.5* 8.2* 7.9*  HCT 26.5* 20.2* 26.0* 25.1*  MCV 88.6 88.2 89.3 88.1  PLT 188 255 259 278   Basic Metabolic Panel: Recent Labs  Lab 06/06/24 0752 06/10/24 1237 06/10/24 1340 06/11/24 0443 06/12/24 0532  NA 136 135  --  131* 130*  K 3.5 3.0*  --  4.7 4.0  CL 101 99  --  99 96*  CO2 25 23  --  22 26  GLUCOSE 130* 126*  --  108* 109*  BUN 18 12  --  10 9  CREATININE 0.82 0.58  --  0.67 0.64  CALCIUM 8.2* 7.9*  --  7.6* 7.9*  MG  --   --  1.7  --  1.7  PHOS  --   --  3.6  --   --    Liver Function Tests: Recent Labs  Lab 06/10/24 1237  AST 31  ALT 13  ALKPHOS 98  BILITOT 0.6  PROT 5.7*  ALBUMIN  2.8*   CBG: No results for input(s): GLUCAP in the last 168 hours.  Scheduled Meds:  Chlorhexidine  Gluconate Cloth  6 each Topical Daily   dexamethasone   4 mg Oral Daily   feeding supplement  237 mL Oral BID BM   hydroxychloroquine   200 mg Oral BID   mirtazapine   7.5 mg Oral QHS   oxyCODONE   15 mg Oral Q12H   pantoprazole   40 mg Oral Daily   sodium chloride  flush  10-40 mL Intracatheter Q12H   [START ON 06/15/2024] Vitamin D  (Ergocalciferol )  50,000 Units Oral Q Sun    Continuous Infusions: PRN Meds:.acetaminophen  **OR** acetaminophen , albuterol , HYDROmorphone  (DILAUDID ) injection, LORazepam , naloxone , oxyCODONE , polyethylene glycol, prochlorperazine , sodium chloride   flush  Family Communication: None at bedside  Disposition: Status is: Inpatient Remains inpatient appropriate because: Intractable abdominal pain     Time spent: 40 minutes  Length of inpatient stay: 1 days  Author: Carliss LELON Canales, DO 06/12/2024 10:32 AM  For on call review [email or web address].   "

## 2024-06-12 NOTE — Progress Notes (Signed)
 "    Palliative Medicine [facility] at [facility] [phone] [phone]   Name: [name] Date: 06/12/2024 MRN: [id number]  DOB: Jul 15, 1955  Patient Care Team: Arloa Elsie SAUNDERS, MD as PCP - General (Family Medicine) [name] (Physician Assistant) Maurie Rayfield BIRCH, RN as Oncology Nurse Navigator Babara Call, MD as Consulting Physician (Oncology)    REASON FOR CONSULTATION: [name] is a 69 y.o. female with multiple medical problems including RA, MS, and metastatic ovarian cancer unresponsive to previous treatment. Patient was hospitalized in November and December 2025 with GI bleed. She had EGD with findings of a bleeding mass, invading the small bowel.  Patient is status post embolization and IVC filter placement.  Patient was admitted to hospital on 06/10/2024 with worsening abdominal pain.  Patient has required frequent transfusions. She was referred to palliative care to discuss goals.   CODE STATUS: DNR  PAST MEDICAL HISTORY: Past Medical History:  Diagnosis Date   Anxiety    Asthma    Back problem    disc disease, chronic low back pain, right leg pain, s/p discectomy 99   Cancer (HCC)    ovarian cancer   Depression    Diastolic dysfunction    with elevated LVEDP at cath   Endometriosis    Fibroids    hx of    GERD (gastroesophageal reflux disease)    Heart murmur    Hepatitis C    treated 8-9 years ago   Hidradenitis suppurativa    History of kidney stones    Hypertension    MS (multiple sclerosis)    Neuromuscular disorder (HCC)    Mulitple sclerosis   Osteoarthritis    Pre-diabetes    Prediabetes    RA (rheumatoid arthritis) (HCC)    Rotator cuff tear    right shoulder    Syncope     PAST SURGICAL HISTORY:  Past Surgical History:  Procedure Laterality Date   ANGIOPLASTY     BACK SURGERY     lumbar surgery x 2 done in [place]  and [place]   COLON RESECTION SIGMOID  08/16/2022    Procedure: COLON RESECTION SIGMOID;  Surgeon: Viktoria Comer SAUNDERS, MD;  Location: [facility];  Service: Gynecology;;   COLONOSCOPY  07/2010   tubular adenoma   COLONOSCOPY  08/2015   CYSTOSCOPY N/A 08/16/2022   Procedure: CYSTOSCOPY;  Surgeon: Viktoria Comer SAUNDERS, MD;  Location: [facility];  Service: Gynecology;  Laterality: N/A;   DISKECTOMY     ESOPHAGOGASTRODUODENOSCOPY N/A 03/30/2024   Procedure: EGD (ESOPHAGOGASTRODUODENOSCOPY);  Surgeon: Maryruth Ole DASEN, MD;  Location: [facility];  Service: Endoscopy;  Laterality: N/A;   HYSTERECTOMY ABDOMINAL WITH SALPINGO-OOPHORECTOMY  08/16/2022   Procedure: HYSTERECTOMY ABDOMINAL WITH SALPINGO-OOPHORECTOMY;  Surgeon: Viktoria Comer SAUNDERS, MD;  Location: [facility];  Service: Gynecology;;   IR ANGIOGRAM SELECTIVE EACH ADDITIONAL VESSEL  06/06/2024   IR ANGIOGRAM SELECTIVE EACH ADDITIONAL VESSEL  06/06/2024   IR ANGIOGRAM VISCERAL SELECTIVE  06/06/2024   IR EMBO TUMOR ORGAN ISCHEMIA INFARCT INC GUIDE ROADMAPPING  06/06/2024   IR IMAGING GUIDED PORT INSERTION  05/18/2022   IR IMAGING GUIDED PORT INSERTION  07/03/2023   IR IVC FILTER PLMT / S&I /IMG GUID/MOD SED  06/06/2024   IR RADIOLOGIST EVAL & MGMT  05/27/2024   IR REMOVAL TUN ACCESS W/ PORT W/O FL MOD SED  12/20/2022   IR US  GUIDE BX ASP/DRAIN  05/18/2022   IR US  GUIDE VASC ACCESS RIGHT  06/06/2024   LAPAROSCOPY N/A 08/16/2022   Procedure: LAPAROSCOPY DIAGNOSTIC;  Surgeon: Viktoria Comer SAUNDERS, MD;  Location: [facility];  Service: Gynecology;  Laterality: N/A;   laparoscopy for endometriosis     left shoulder scope     RADIOLOGY WITH ANESTHESIA N/A 05/18/2022   Procedure: IR WITH ANESTHESIA PORT AND BIOPSY;  Surgeon: Luverne Aran, MD;  Location: [facility];  Service: Radiology;  Laterality: N/A;   ROTATOR CUFF REPAIR Left    Spinal Fusion  12/2016   L4-S1    HEMATOLOGY/ONCOLOGY HISTORY:  Oncology History Overview Note  High grade serous, p53 mutated Neg genetics, HRD not detected, MSI stable, low TMB 2,  progressed on carboplatin  and gemcitabine , paclitaxel    Malignant neoplasm of both ovaries (HCC)  04/11/2022 Imaging   CT of the abdomen and pelvis on 04/11/2022 reveals a left adnexal masslike area measuring 5.9 x 3.6 cm.  Margins of this mass are irregular.  There is small fluid in the cul-de-sac as well as fullness of soft tissue about the right adnexa.  Discrete uterine structure is not identified.  Left upper quadrant nodules beneath the left hemidiaphragm (1 anterior to the spleen and the other to the stomach).  Omental/peritoneal nodularity and multiple perihepatic nodules are noted    04/18/2022 Tumor Marker   Patient's tumor was tested for the following markers: CA-125. Results of the tumor marker test revealed 123.   04/19/2022 Initial Diagnosis   Carcinomatosis (HCC)   04/28/2022 Imaging   1. Bulky bilateral ovarian masses and nodules, left-greater-than-right, which are essentially confluent with adjacent pelvic soft tissue nodularity and not significantly changed compared to prior examination dated 04/11/2022. 2. Extensive pelvic peritoneal thickening and nodularity. Multiple small peritoneal nodules throughout the abdomen and pelvis. Findings are consistent with peritoneal metastatic disease and likewise not significantly changed. 3. Small volume of loculated appearing fluid in the low pelvis. 4. 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 The mid to distal right ureter is obstructed by right ovarian mass and or soft tissue nodularity. 5. Calculus within the most distal left ureterovesicular junction or just within the bladder lumen measuring 0.7 cm, unchanged. No associated left-sided hydronephrosis. 6. Soft tissue attenuation nodule of the body of the right adrenal gland. Notably, this was also soft tissue attenuation on prior noncontrast examination (i.e. not definitively macroscopic  fat containing) although unchanged compared to prior examinations dating back to 2018 and almost certainly a benign adenoma. Attention on follow-up. 7. No evidence of metastatic disease in the chest. 8. Mild diffuse bilateral bronchial wall thickening. Background of very fine centrilobular nodularity, most concentrated in the lung apices. Findings are most consistent with smoking-related respiratory bronchiolitis.     05/18/2022 Procedure   Ultrasound-guided core biopsy performed of a 10 mm soft tissue peritoneal mass in the right upper quadrant just superficial to the right lobe of the liver. The procedure was performed under general anesthesia immediately following Port-A-Cath placement.   05/18/2022 Procedure   Placement of single lumen port a cath via right internal jugular vein. The catheter tip lies at the cavo-atrial junction. A power injectable port a cath was placed and is ready for immediate use.     05/18/2022 Pathology Results   A. 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  The findings support the  above interpretation.  This case was reviewed with Dr. Rebbecca who agrees with the above diagnosis.  A p16 stain is pending and will be reported in an addendum.    05/23/2022 Cancer Staging   Staging form: Ovary, Fallopian Tube, and Primary Peritoneal Carcinoma, AJCC 8th Edition - Clinical stage from 05/23/2022: FIGO Stage IIIC (cT3c, cN0, cM0) - Signed by Lonn Hicks, MD on 05/23/2022 Stage prefix: Initial diagnosis   05/29/2022 Imaging   1. Complex bilateral adnexal masses, as described above, consistent with the patient's known malignancy. 2. Subsequent marked severity mass effect on the distal bilateral common iliac veins, increased in severity when compared to the prior exam. 3. Additional findings consistent with  peritoneal metastasis within the pelvis. 4. Findings consistent with pelvic congestion syndrome. 5. Postoperative changes within the mid and lower lumbar spine. 6. Aortic atherosclerosis. 7. No evidence of venous thrombus within the abdomen, pelvis or proximal bilateral lower extremities.   Aortic Atherosclerosis (ICD10-I70.0).   06/02/2022 - 11/03/2022 Chemotherapy   Patient is on Treatment Plan : OVARIAN Carboplatin  (AUC 6) + Paclitaxel  (175) q21d X 6 Cycles     06/26/2022 Tumor Marker   Patient's tumor was tested for the following markers: CA-125. Results of the tumor marker test revealed 134.   08/04/2022 Imaging   CT ABDOMEN PELVIS W CONTRAST  Result Date: 08/04/2022 CLINICAL DATA:  Ovarian cancer, chemotherapy in progress, for restaging EXAM: CT ABDOMEN AND PELVIS WITH CONTRAST TECHNIQUE: Multidetector CT imaging of the abdomen and pelvis was performed using the standard protocol following bolus administration of intravenous contrast. RADIATION DOSE REDUCTION: This exam was performed according to the departmental dose-optimization program which includes automated exposure control, adjustment of the mA and/or kV according to patient size and/or use of iterative reconstruction technique. CONTRAST:  OMNIPAQUE  IOHEXOL  300 MG/ML  SOLN COMPARISON:  CT venogram abdomen/pelvis dated 05/29/2022. CT abdomen/pelvis dated 04/26/2022. FINDINGS: Lower chest: Lung bases are clear. Hepatobiliary: Liver is within normal limits. Gallbladder is unremarkable. No intrahepatic or extrahepatic ductal dilatation. Pancreas: Within normal limits. Spleen: Within normal limits. Adrenals/Urinary Tract: 2.4 cm right adrenal nodule (series 2/image 17), previously 2.0 cm in 2018, likely reflecting a benign adrenal adenoma. Left adrenal gland is within normal limits. Left kidney is within normal limits. Right kidney is notable for mild hydronephrosis with moderate hydroureteronephrosis and duplicated ureters (series  2/image 38), chronic. Associated extrinsic compression at the level of the right adnexal mass (described below). Mildly thick-walled bladder, although underdistended. Stable 6 mm left UVJ calcification (series 2/image 68). Stomach/Bowel: Stomach is within normal limits. No evidence of bowel obstruction. Normal appendix (series 2/image 47). Left adnexal mass (described below) is favored to abut and directly invade the left lateral wall of the sigmoid colon (series 2/image 57). Vascular/Lymphatic: No evidence of abdominal aortic aneurysm. Atherosclerotic calcifications of the abdominal aorta and branch vessels. No suspicious abdominopelvic lymphadenopathy. Reproductive: Heterogeneous uterus with associated surface nodularity (series 2/image 65), likely reflecting peritoneal disease. This appearance is improved from the prior. 3.9 x 3.3 cm left adnexal mass (series 2/image 60), previously 5.3 x 3.9 cm, with extension along the sigmoid mesocolon (series 2/image 56) and suspected involvement of the sigmoid colon (series 2/image 57). 5.6 x 2.7 cm mixed cystic/solid right adnexal mass (series 2/image 89), previously 6.9 x 4.3 cm when measured in a similar fashion. Other: No abdominopelvic ascites. Scattered minimal peritoneal nodularity beneath the anterior abdominal wall, including a 5 mm nodule beneath the left anterior abdominal wall (series 2/image 27) which previously measured 12  mm. Additional mild nodularity on series 2/images 32, 33, and 50. Musculoskeletal: Status post PLIF at L4-S1. Status post lateral fixation at L4-5. Status post anterior fixation at L5-S1. Mild degenerative changes of the lower thoracic spine. IMPRESSION: Improving bilateral adnexa masses in this patient with known ovarian cancer, as above. Suspected involvement of the sigmoid colon. No evidence of bowel obstruction. Improving peritoneal nodularity, as above. Additional ancillary findings as above. Electronically Signed   By: Pinkie Pebbles  M.D.   On: 08/04/2022 01:27      08/16/2022 Pathology Results   CASE: [id number] PATIENT: BARBEE MACE Surgical Pathology Report  Clinical History: Advanced gyn malignancy (crm)  FINAL MICROSCOPIC DIAGNOSIS:  A. OMENTUM, RESECTION: - Positive for carcinoma  B. LIVER ADHESION, EXCISION: - Benign fibroadipose tissue - Negative for carcinoma  C. SMALL BOWEL MESENTERIC IMPLANT, EXCISION: - Positive for carcinoma with extensive necrosis  D. UTERUS, CERVIX, BILATERAL FALLOPIAN TUBES AND OVARIES, AND APPENDIX, RESECTION: - Uterine serosa: Tumor deposits present - Cervix: Benign, nabothian cyst - Endometrium: Benign inactive endometrium, benign endometrial polyp - Myometrium: Leiomyoma - Right fallopian tube: Metastatic tumor deposits present - Right ovary: High-grade serous carcinoma - Left ovary: High-grade serous carcinoma - Benign appendix - See oncology table  E. COLON, RECTOSIGMOID, RESECTION: - Benign colonic mucosa - Margins free of carcinoma - Adherent fallopian tube with metastatic tumor deposits - 2 benign lymph nodes  F. RECTAL TUMOR IMPLANT, EXCISION: - Metastatic high-grade serous carcinoma   G. COLON, RECTOSIGMOID ANASTOMOTIC RINGS: - Benign anastomotic donuts  ONCOLOGY TABLE:   OVARY or FALLOPIAN TUBE or PRIMARY PERITONEUM: Resection  Procedure: Total abdominal hysterectomy, bilateral salpingo-oophorectomy, appendectomy, rectosigmoid resection, total omentectomy, small bowel nodule resection Specimen Integrity: Intact Tumor Site: Bilateral Tumor Size: Right ovary: 3.9 cm, left ovary: Approximately 1.2 cm Histologic Type: High-grade serous carcinoma Histologic Grade: High-grade Ovarian Surface Involvement: Present Fallopian Tube Surface Involvement: Present Implants: Present, small bowel mesenteric and rectovaginal septum Lymphatic and/or Vascular Invasion: Not identified Other Tissue/ Organ Involvement: Fallopian tube, bilateral Largest  Extrapelvic Peritoneal Focus: 1.4 cm, small bowel mesentery Peritoneal/Ascitic Fluid Involvement: Not applicable Chemotherapy Response Score (CRS): Not applicable, no known presurgical therapy Regional Lymph Nodes: Not applicable (no lymph nodes submitted or found) Distant Metastasis:      Distant Site(s) Involved: Not applicable Pathologic Stage Classification (pTNM, AJCC 8th Edition): pT3b, pN[not assigned] Ancillary Studies: Can be performed upon request Representative Tumor Block: D8 Comment(s): Tumor is staged as pT3 given the presence of extrapelvic peritoneal deposits on small bowel mesentery less than 2cm (1.4 cm) ([id number])    09/18/2022 Tumor Marker   Patient's tumor was tested for the following markers: CA-125. Results of the tumor marker test revealed 36.8.   10/15/2022 Genetic Testing   Negative [facility] Multi-Cancer +RNA Panel.  VUS in POLD1 at c.1322C>T (p.Thr441Met). Report date is 10/15/2022.   The Multi-Cancer + RNA Panel offered by [facility] includes sequencing and/or deletion/duplication analysis of the following 70 genes:  AIP*, ALK, APC*, ATM*, AXIN2*, BAP1*, BARD1*, BLM*, BMPR1A*, BRCA1*, BRCA2*, BRIP1*, CDC73*, CDH1*, CDK4, CDKN1B*, CDKN2A, CHEK2*, CTNNA1*, DICER1*, EPCAM (del/dup only), EGFR, FH*, FLCN*, GREM1 (promoter dup only), HOXB13, KIT, LZTR1, MAX*, MBD4, MEN1*, MET, MITF, MLH1*, MSH2*, MSH3*, MSH6*, MUTYH*, NF1*, NF2*, NTHL1*, PALB2*, PDGFRA, PMS2*, POLD1*, POLE*, POT1*, PRKAR1A*, PTCH1*, PTEN*, RAD51C*, RAD51D*, RB1*, RET, SDHA* (sequencing only), SDHAF2*, SDHB*, SDHC*, SDHD*, SMAD4*, SMARCA4*, SMARCB1*, SMARCE1*, STK11*, SUFU*, TMEM127*, TP53*, TSC1*, TSC2*, VHL*. RNA analysis is performed for * genes.   11/06/2022 Tumor Marker   Patient's tumor was  tested for the following markers: CA-125. Results of the tumor marker test revealed 16.8.   12/11/2022 Tumor Marker   Patient's tumor was tested for the following markers: CA-125. Results of the tumor marker test  revealed 12.5.   12/14/2022 Imaging   CT ABDOMEN PELVIS W CONTRAST  Result Date: 12/13/2022 CLINICAL DATA:  Ovarian cancer restaging * Tracking Code: BO * EXAM: CT ABDOMEN AND PELVIS WITH CONTRAST TECHNIQUE: Multidetector CT imaging of the abdomen and pelvis was performed using the standard protocol following bolus administration of intravenous contrast. RADIATION DOSE REDUCTION: This exam was performed according to the departmental dose-optimization program which includes automated exposure control, adjustment of the mA and/or kV according to patient size and/or use of iterative reconstruction technique. CONTRAST:  OMNIPAQUE  IOHEXOL  300 MG/ML  SOLN COMPARISON:  08/02/2022 FINDINGS: Lower chest: Unremarkable Hepatobiliary: Unremarkable Pancreas: Unremarkable Spleen: Unremarkable Adrenals/Urinary Tract: 2.6 by 2.1 cm right adrenal mass, internal density 55 Hounsfield units on portal venous phase images, relative washout of 13% which is indeterminate. Back in 2016 I measure this lesion at 2.2 by 1.6 cm, on the more recent comparison of 07/18/2022 this lesion measured 2.6 by 2.1 cm similar to today. Accordingly this lesion is stable from March and only very minimally increased in size from 2016, most likely benign. This can likely be surveilled in the context of the patient's ongoing oncology CT examinations. 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 The ureters transition to relatively normal caliber in the vicinity of the iliac vessel cross over, a specific cause for the transition is not identified. There is also a duplicated left renal collecting system without hydroureter on the left Stable 7 mm calcification or stone in the vicinity of the left ureterovesical junction. Stomach/Bowel: Prominent stool throughout the colon favors constipation. Anastomotic staple line in the sigmoid colon. No  dilated small bowel. Interval appendectomy. Previous nodularity along bowel including the sigmoid colon no longer identified. Vascular/Lymphatic: Atherosclerosis is present, including aortoiliac atherosclerotic disease. No pathologic adenopathy. Reproductive: Interval hysterectomy with salpingo-oophorectomy. Other: Interval omentectomy. No current compelling findings of peritoneal tumor deposits. No ascites. Musculoskeletal: Prior fusion at L4-L5-S1. IMPRESSION: 1. Interval hysterectomy, omentectomy, and appendectomy. No compelling findings of recurrent or residual peritoneal tumor deposits. 2. 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 A specific cause for the transition in caliber is not identified. 3. Stable 7 mm calcification or stone in the vicinity of the left ureterovesical junction. Duplicated left renal collecting system. 4. Prominent stool throughout the colon favors constipation. 5. Chronically stable but otherwise nonspecific right adrenal mass, likely benign. 6. Aortic atherosclerosis. Aortic Atherosclerosis (ICD10-I70.0). Electronically Signed   By: Ryan Salvage M.D.   On: 12/13/2022 15:16      12/20/2022 Procedure   Successful removal of a RIGHT chest implanted Port-A-Cath.    03/19/2023 Tumor Marker   Patient's tumor was tested for the following markers: CA-125. Results of the tumor marker test revealed 19.7.   06/14/2023 Imaging   CT ABDOMEN PELVIS W CONTRAST Result Date: 06/21/2023 CLINICAL DATA:  Restaging ovarian cancer.  * Tracking Code: BO * EXAM: CT ABDOMEN AND PELVIS WITH CONTRAST TECHNIQUE: Multidetector CT imaging of the abdomen and pelvis was performed using the standard protocol following bolus administration of intravenous contrast. RADIATION DOSE REDUCTION: This exam was  performed according to the departmental  dose-optimization program which includes automated exposure control, adjustment of the mA and/or kV according to patient size and/or use of iterative reconstruction technique. CONTRAST:  OMNIPAQUE  IOHEXOL  300 MG/ML  SOLN COMPARISON:  CT abdomen and pelvis dated 12/08/2022 FINDINGS: Lower chest: No focal consolidation or pulmonary nodule in the lung bases. No pleural effusion or pneumothorax demonstrated. Partially imaged heart size is normal. Hepatobiliary: No focal hepatic lesions. No intra or extrahepatic biliary ductal dilation. Normal gallbladder. Pancreas: No focal lesions or main ductal dilation. Spleen: Normal in size without focal abnormality. Adrenals/Urinary Tract: Unchanged 2.0 cm right adrenal nodule measuring 43 HU (2:16). No left adrenal nodule. 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 Duplex left kidney. No hydronephrosis. Unchanged 7 mm calcification adjacent to the left ureterovesical junction. No focal bladder wall thickening. Stomach/Bowel: Normal appearance of the stomach. Postsurgical changes from rectosigmoid resection with patent anastomosis. No evidence of bowel wall thickening, distention, or inflammatory changes. Appendectomy. Vascular/Lymphatic: Aortic atherosclerosis. New and increased size of multi station lymphadenopathy and peritoneal nodules, for example: -12 mm cardiophrenic (2:2), previously partially imaged at 3 mm -new 12 mm midline epigastric anterior peritoneal (2:23) -new 3.1 x 2.9 cm peripancreatic (2:25) -14 mm right pelvic sidewall (2:56), previously 4 mm -11 mm right external iliac (2:60), previously 7 mm Reproductive: Status post hysterectomy and bilateral salpingo-oophorectomy. No adnexal masses. Increased lobulated soft tissue density adjacent to the right vaginal cuff measuring 2.9 x 1.8 cm (2:64), previously 2.4 x 1.7 cm Other: Status post total omentectomy. No free fluid, fluid collection, or free air. Musculoskeletal: No acute or abnormal lytic  or blastic osseous lesions. Postsurgical changes of L4-S1 spinal fixation. Hardware appears intact. Postsurgical changes of the anterior abdominal wall. IMPRESSION: 1. New and increased size of multi station lymphadenopathy and peritoneal nodules, consistent with metastatic disease. 2. Increased lobulated soft tissue density adjacent to the right vaginal cuff measuring 2.9 x 1.8 cm, previously 2.4 x 1.7 cm, suspicious for recurrent disease. 3. Unchanged 2.0 cm right adrenal nodule, favored benign. Recommend continued attention on follow-up. 4. 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 Duplex left kidney. No hydronephrosis. Unchanged 7 mm calcification adjacent to the left ureterovesical junction. 5.  Aortic Atherosclerosis (ICD10-I70.0). Electronically Signed   By: Limin  Xu M.D.   On: 06/21/2023 09:35      06/18/2023 Tumor Marker   Patient's tumor was tested for the following markers: CA-125. Results of the tumor marker test revealed 32.9.   07/03/2023 Procedure   Successful placement of a power injectable Port-A-Cath via the right internal jugular vein. The catheter is ready for immediate use.   07/04/2023 - 08/31/2023 Chemotherapy   Patient is on Treatment Plan : OVARIAN RECURRENT 3RD LINE Bevacizumab  D1 + Carboplatin  D1 + Gemcitabine  D1,8 (4/800) q21d     07/13/2023 Tumor Marker   Patient's tumor was tested for the following markers: CA-125. Results of the tumor marker test revealed 33.2.   08/17/2023 Tumor Marker   Patient's tumor was tested for the following markers: CA-125. Results of the tumor marker test revealed 16.1.   08/30/2023 Imaging   1. Status post hysterectomy and bilateral salpingo-oophorectomy. 2. Multistation adenopathy and peritoneal nodularity is again noted, compatible with metastatic disease. Mild increased size of peripancreatic node. Upper abdominal peritoneal nodule is slightly decreased in size in the interval. Additional index lymph nodes are stable  in the interval. 3. Increased soft tissue density adjacent to the vaginal cuff, concerning for locally recurrent disease. 4. Duplex  right kidney with mild hydronephrosis and hydroureter, unchanged. Stone within the left UVJ is again noted measuring 0.8 x 0.4 cm, unchanged from previous exam. 5. Stable right adrenal nodule.  None 6. Supraumbilical ventral abdominal wall hernia containing a nonobstructed loop of small bowel. 7.  Aortic Atherosclerosis (ICD10-I70.0).       09/27/2023 - 11/15/2023 Chemotherapy   Patient is on Treatment Plan : OVARIAN Paclitaxel  (80) I8,1,84,77 q28d     09/28/2023 Tumor Marker   Patient's tumor was tested for the following markers: CA-125. Results of the tumor marker test revealed 29.8.   11/05/2023 Tumor Marker   Patient's tumor was tested for the following markers: CA-125. Results of the tumor marker test revealed 40.   11/16/2023 Tumor Marker   Patient's tumor was tested for the following markers: CA-125. Results of the tumor marker test revealed 32.3.   11/22/2023 Imaging   CT ABDOMEN PELVIS W CONTRAST Result Date: 11/28/2023 CLINICAL DATA:  Ovarian cancer.  * Tracking Code: BO * EXAM: CT ABDOMEN AND PELVIS WITH CONTRAST TECHNIQUE: Multidetector CT imaging of the abdomen and pelvis was performed using the standard protocol following bolus administration of intravenous contrast. RADIATION DOSE REDUCTION: This exam was performed according to the departmental dose-optimization program which includes automated exposure control, adjustment of the mA and/or kV according to patient size and/or use of iterative reconstruction technique. CONTRAST:  OMNIPAQUE  IOHEXOL  300 MG/ML  SOLN COMPARISON:  08/30/2023. FINDINGS: Lower chest: Lung bases are clear. Prepericardiac lymph node measures 1.6 cm, enlarged from 1.1 cm on 08/30/2023. Heart is at the upper limits of normal in size. No pericardial or pleural effusion. Distal esophagus is grossly unremarkable.  Hepatobiliary: Liver and gallbladder are unremarkable. No biliary ductal dilatation. Pancreas: Negative. Spleen: Negative. Adrenals/Urinary Tract: 2.4 cm right adrenal nodule, chronically stable and characterized previously 2 as an adenoma. No specific follow-up necessary. Minimal nodular thickening of the left adrenal gland, unchanged. Minimal scarring in the right kidney. Kidneys are otherwise unremarkable. Difficulty did right ureter appears to join in the midportion. Right ureter is decompressed. Left ureter may be minimally dilated secondary to an 8 mm stone at the left ureteral orifice, unchanged from 08/30/2023. Bladder is otherwise grossly unremarkable. Stomach/Bowel: Stomach, small bowel and colon are unremarkable. Appendix is not readily visualized. Vascular/Lymphatic: Atherosclerotic calcification of the aorta. Heterogeneous nodal mass or peritoneal implant between the distal stomach and pancreas measures 3.7 x 5.3 cm (2/29), enlarged from 3.0 x 3.7 cm. Right external iliac lymph node measures 7 mm (2/58), previously 12 mm. A second right external iliac lymph node measures 7 mm (2/64), stable. Reproductive: Hysterectomy. Similar asymmetric nodular soft tissue along the right vaginal cuff (2/66-67). No adnexal mass. Other: Previously seen ventral midline epigastric peritoneal nodule is no longer visualized. No free fluid. Small incisional hernia contains fat along the ventral supraumbilical midline. Musculoskeletal: Osteopenia. Degenerative changes in the spine. L5-S1 fusion. IMPRESSION: 1. Enlarging prepericardiac nodal metastasis. Enlarging nodal mass or peritoneal implant between the distal stomach and pancreas. 2. Resolved ventral midline epigastric peritoneal metastasis. 3. Right external iliac lymph nodes are stable to smaller in size. 4. Asymmetric nodular thickening of the right vaginal cuff, stable. 5. Trace left ureteral dilatation with a chronic 8 mm stone at the left ureteral orifice. 6. Stable  right adrenal nodule, previously characterized as an adenoma. 7.  Aortic atherosclerosis (ICD10-I70.0). Electronically Signed   By: Newell Eke M.D.   On: 11/28/2023 13:56      02/22/2024 Imaging   CT ABDOMEN PELVIS  W CONTRAST Result Date: 02/24/2024 CLINICAL DATA:  Ovarian cancer, monitor, off chemotherapy. * Tracking Code: BO * EXAM: CT ABDOMEN AND PELVIS WITH CONTRAST TECHNIQUE: Multidetector CT imaging of the abdomen and pelvis was performed using the standard protocol following bolus administration of intravenous contrast. RADIATION DOSE REDUCTION: This exam was performed according to the departmental dose-optimization program which includes automated exposure control, adjustment of the mA and/or kV according to patient size and/or use of iterative reconstruction technique. CONTRAST:  OMNIPAQUE  IOHEXOL  300 MG/ML  SOLN COMPARISON:  Multiple priors including CT November 22, 2023 FINDINGS: Lower chest: No acute abnormality. Centrally necrotic pericardiophrenic lymph node measures 18 mm in short axis on image 3/2 previously 16 mm Hepatobiliary: No suspicious hepatic lesion. Gallbladder is unremarkable. No biliary ductal dilation. Pancreas: No pancreatic ductal dilation or evidence of acute inflammation. Spleen: No splenomegaly. Adrenals/Urinary Tract: 2.1 cm right adrenal nodule on image 20/2 stable over multiple prior examinations and previously characterized as a benign adenoma. Left adrenal gland appears normal. 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 Similar prominence of the left ureter with 8 mm stone at the UVJ. Stomach/Bowel: Stomach is minimally distended limiting evaluation. No pathologic dilation of small or large bowel. No evidence of acute bowel inflammation. Vascular/Lymphatic: Normal caliber abdominal aorta. Smooth IVC contours. Narrowing of the portal vein and portal confluence by a heterogeneous enlarged lymph node/mass which measures  7.4 x 6.5 cm on image 27/2 previously 4.7 x 4.1 cm when remeasured for consistency, this sits between the pancreatic head and distal stomach. Previously indexed right external iliac lymph node measures 9 mm in short axis on image 58/2 previously 7 mm -additional right external iliac lymph node previously indexed measures 6 mm in short axis on image 66/2 previously 7 mm. Reproductive: Prior hysterectomy with similar asymmetric nodular soft tissue on the right side of the vaginal cuff on image 67/2. no suspicious adnexal mass. Other: No significant abdominopelvic free fluid. Postsurgical change in the anterior abdominal wall with fat containing incisional hernia. Musculoskeletal: No aggressive lytic or blastic lesion of bone. Spinal fusion hardware. Diffuse demineralization of bone. Thoracolumbar spondylosis. IMPRESSION: 1. Interval increase in size of the heterogeneous enlarged lymph node/mass between the pancreatic head and distal stomach. 2. Slight interval increase in size of the centrally necrotic pericardiophrenic lymph node. 3. Previously indexed right external iliac lymph nodes are similar to slightly increased in size. 4. Prior hysterectomy with similar asymmetric nodular soft tissue on the right side of the vaginal cuff. 5. Similar prominence of the left ureter with 8 mm stone at the UVJ. 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 Electronically Signed   By: Reyes Holder M.D.   On: 02/24/2024 13:03   MR BRAIN W WO CONTRAST Result Date: 02/18/2024 CLINICAL DATA:  Multiple sclerosis. EXAM: MRI HEAD WITHOUT AND WITH CONTRAST TECHNIQUE: Multiplanar, multiecho pulse sequences of the brain and surrounding structures were obtained without and with intravenous contrast. CONTRAST:  8 mL Vueway  COMPARISON:  Head MRI 10/26/2022 FINDINGS: Brain: There is no evidence of an acute infarct, intracranial hemorrhage, mass, midline shift, or extra-axial fluid collection. Cerebral  volume is within normal limits for age. The ventricles are normal in size. T2 hyperintensities in the juxtacortical, periventricular, and deep cerebral white matter bilaterally and in the pons are unchanged and moderately advanced for age. None show enhancement or restricted diffusion. Mild T2 heterogeneity in the thalami and at most minimal in the cerebellum are unchanged. Vascular:  Major intracranial vascular flow voids are preserved. Skull and upper cervical spine: Unremarkable bone marrow signal. Sinuses/Orbits: Unremarkable orbits. Paranasal sinuses and mastoid air cells are clear. Other: None. IMPRESSION: Unchanged moderately advanced white matter disease, nonspecific but may reflect a combination chronic demyelinating disease and chronic small vessel ischemia. No evidence of active demyelination. Electronically Signed   By: Dasie Hamburg M.D.   On: 02/18/2024 08:06      03/29/2024 Imaging   US  Venous Img Upper Uni Left (DVT) Result Date: 04/01/2024 CLINICAL DATA:  LEFT upper extremity pain. History of tobacco use and ovarian malignancy. EXAM: LEFT UPPER EXTREMITY VENOUS DOPPLER ULTRASOUND TECHNIQUE: Gray-scale sonography with graded compression, as well as color Doppler and duplex ultrasound were performed to evaluate the upper extremity deep venous system from the level of the subclavian vein and including the jugular, axillary, basilic, radial, ulnar and upper cephalic vein. Spectral Doppler was utilized to evaluate flow at rest and with distal augmentation maneuvers. COMPARISON:  None available FINDINGS: Internal Jugular Vein: No evidence of thrombus. Normal compressibility, respiratory phasicity and response to augmentation. Subclavian Vein: No evidence of thrombus. Normal compressibility, respiratory phasicity and response to augmentation. Axillary Vein: No evidence of thrombus. Normal compressibility, respiratory phasicity and response to augmentation. Cephalic Vein: Intramural filling defect with  decreased flow and compressibility of the cephalic vein and adjacent varicose vein at the level of the mid upper arm is consistent with acute DVT. This also corresponds to the patient's palpable abnormality. Basilic Vein: No evidence of thrombus. Normal compressibility, respiratory phasicity and response to augmentation. Brachial Veins: No evidence of thrombus. Normal compressibility, respiratory phasicity and response to augmentation. Radial Veins: No evidence of thrombus. Normal compressibility, respiratory phasicity and response to augmentation. Ulnar Veins: No evidence of thrombus. Normal compressibility, respiratory phasicity and response to augmentation. Venous Reflux:  None visualized. Other Findings:  None visualized. IMPRESSION: 1. No DVT of the LEFT upper extremity. 2. Acute superficial venous thrombosis of the cephalic vein and adjacent varicose vein at the level of the mid upper arm. Electronically Signed   By: Aliene Lloyd M.D.   On: 04/01/2024 07:35   CT ANGIO GI BLEED Result Date: 03/29/2024 CLINICAL DATA:  Lightheadedness, dark stool for 1 month, epigastric pain for several days, history of ovarian cancer EXAM: CTA ABDOMEN AND PELVIS WITHOUT AND WITH CONTRAST TECHNIQUE: Multidetector CT imaging of the abdomen and pelvis was performed using the standard protocol during bolus administration of intravenous contrast. Multiplanar reconstructed images and MIPs were obtained and reviewed to evaluate the vascular anatomy. RADIATION DOSE REDUCTION: This exam was performed according to the departmental dose-optimization program which includes automated exposure control, adjustment of the mA and/or kV according to patient size and/or use of iterative reconstruction technique. CONTRAST:  OMNIPAQUE  IOHEXOL  350 MG/ML SOLN COMPARISON:  02/22/2024 FINDINGS: VASCULAR Aorta: Normal caliber aorta without aneurysm, dissection, vasculitis or significant stenosis. Mild atherosclerosis. Celiac: Patent without  evidence of aneurysm, dissection, vasculitis or significant stenosis. SMA: Patent without evidence of aneurysm, dissection, vasculitis or significant stenosis. Renals: Both renal arteries are patent without evidence of aneurysm, dissection, vasculitis, fibromuscular dysplasia or significant stenosis. IMA: Patent without evidence of aneurysm, dissection, vasculitis or significant stenosis. Inflow: Patent without evidence of aneurysm, dissection, vasculitis or significant stenosis. Evaluation is slightly limited due to streak artifact from lumbar orthopedic hardware. Proximal Outflow: Bilateral common femoral and visualized portions of the superficial and profunda femoral arteries are patent without evidence of aneurysm, dissection, vasculitis or significant stenosis. Veins: Continue mass effect upon  the portal vein confluence due to the large mass between the duodenum and pancreatic head. No other acute venous abnormalities. Review of the MIP images confirms the above findings. NON-VASCULAR Lower chest: Pericardiophrenic lymph node is again identified, measuring 2.4 x 2.2 cm, previously 2.5 x 2.0 cm. No acute airspace disease. Hepatobiliary: No focal liver abnormality is seen. No gallstones, gallbladder wall thickening, or biliary dilatation. Pancreas: Unremarkable. No pancreatic ductal dilatation or surrounding inflammatory changes. Spleen: Normal in size without focal abnormality. Adrenals/Urinary Tract: Kidneys enhance normally. No urinary tract calculi. 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 No left-sided obstruction. 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 No specific follow-up is required. Left adrenal is unremarkable. Bladder is normal. Stomach/Bowel: No bowel obstruction or ileus. Prior sigmoid colon resection and reanastomosis. Previous appendectomy. There is a large necrotic 7.8 x 8.7 x 8.1 cm mass interposed between the  head of the pancreas and duodenal ball. This mass displaces and likely invades the wall of the distal stomach and proximal duodenum, and may be the source for chronic blood loss. However, on today's exam, there is no evidence of contrast accumulation within the bowel lumen to suggest active hemorrhage. Lymphatic: Previous index enlarged lymph node in the right external iliac chain now measures 12 mm in short axis reference image 62/13, previously 9 mm. Stable right external iliac/inguinal lymph node image 69/13 measuring 6 mm in short axis. No new adenopathy is identified. Reproductive: Stable postsurgical changes from hysterectomy. Other: No free fluid or free intraperitoneal gas. Enlarged para incisional hernia in the right supraumbilical region, now containing a short segment of small bowel. No evidence of incarceration or obstruction. Musculoskeletal: Stable postsurgical changes within the lower lumbar spine. There are no acute or destructive bony abnormalities. Reconstructed images demonstrate no additional findings. IMPRESSION: VASCULAR 1. No evidence of active gastrointestinal bleeding. 2.  Aortic Atherosclerosis (ICD10-I70.0). NON-VASCULAR 1. Enlarging necrotic soft tissue mass interposed between the head of the pancreas and proximal duodenum. There is likely invasion of the dorsal wall of the distal stomach and duodenum, and this could be a source for chronic blood loss given clinical history. There is no evidence of active hemorrhage on today's exam. 2. Progressive adenopathy within the pericardiophrenic region and right external iliac chain consistent with worsening metastatic disease. 3. Para incisional hernia in the right supraumbilical region, containing a short segment of small bowel. No incarceration or obstruction. Electronically Signed   By: Ozell Daring M.D.   On: 03/29/2024 16:19   DG Chest 2 View Result Date: 03/29/2024 CLINICAL DATA:  Chest pain. EXAM: CHEST - 2 VIEW COMPARISON:  Chest CT  dated 09/19/2022. FINDINGS: Right-sided Port-A-Cath with tip over central SVC. No focal consolidation, pleural effusion, pneumothorax. The cardiac silhouette is within normal limits. No acute osseous pathology. Degenerative changes of the shoulders. IMPRESSION: No active cardiopulmonary disease. Electronically Signed   By: Vanetta Chou M.D.   On: 03/29/2024 13:01      06/17/2024 - 06/17/2024 Chemotherapy   Patient is on Treatment Plan : OVARIAN Liposomal Doxorubicin (40) q28d       ALLERGIES:  is allergic to cymbalta [duloxetine hcl], cyclobenzaprine hcl, celexa  [citalopram hydrobromide], fluticasone  furoate-vilanterol, gabapentin, lyrica  cr [pregabalin  er], opana  [oxymorphone hcl], and tramadol.  MEDICATIONS:  Current Facility-Administered Medications  Medication Dose Route Frequency Provider Last Rate Last Admin   acetaminophen  (TYLENOL ) tablet 650 mg  650 mg Oral Q6H PRN Jens Durand, MD  Or   acetaminophen  (TYLENOL ) suppository 650 mg  650 mg Rectal Q6H PRN Jens Durand, MD       albuterol  (PROVENTIL ) (2.5 MG/3ML) 0.083% nebulizer solution 2.5 mg  2.5 mg Nebulization Q6H PRN Jens Durand, MD       Chlorhexidine  Gluconate Cloth 2 % PADS 6 each  6 each Topical Daily Arlon, Derek W, DO       dexamethasone  (DECADRON ) tablet 4 mg  4 mg Oral Daily Jens Durand, MD       feeding supplement (ENSURE PLUS HIGH PROTEIN) liquid 237 mL  237 mL Oral BID BM Arlon, Derek W, DO       HYDROmorphone  (DILAUDID ) injection 1 mg  1 mg Intravenous Q3H PRN Jens Durand, MD   1 mg at 06/12/24 0350   hydroxychloroquine  (PLAQUENIL ) tablet 200 mg  200 mg Oral BID Jens Durand, MD   200 mg at 06/11/24 2111   LORazepam  (ATIVAN ) tablet 0.5 mg  0.5 mg Oral TID PRN Jens Durand, MD   0.5 mg at 06/10/24 2201   mirtazapine  (REMERON ) tablet 7.5 mg  7.5 mg Oral QHS Jens Durand, MD   7.5 mg at 06/11/24 2111   naloxone  (NARCAN ) nasal spray 4 mg/0.1 mL  1 spray Nasal PRN Jens Durand, MD        oxyCODONE  (Oxy IR/ROXICODONE ) immediate release tablet 20 mg  20 mg Oral 5 X Daily PRN Jens Durand, MD   20 mg at 06/12/24 9194   oxyCODONE  (OXYCONTIN ) 12 hr tablet 15 mg  15 mg Oral Q12H Jens Durand, MD   15 mg at 06/11/24 2111   pantoprazole  (PROTONIX ) EC tablet 40 mg  40 mg Oral Daily Ayiku, Bernard, MD   40 mg at 06/11/24 1220   polyethylene glycol (MIRALAX  / GLYCOLAX ) packet 17 g  17 g Oral Daily PRN Jens Durand, MD       prochlorperazine  (COMPAZINE ) tablet 10 mg  10 mg Oral Q6H PRN Jens Durand, MD       sodium chloride  flush (NS) 0.9 % injection 10-40 mL  10-40 mL Intracatheter Q12H Arlon, Derek W, DO       sodium chloride  flush (NS) 0.9 % injection 10-40 mL  10-40 mL Intracatheter PRN Arlon Carliss ORN, DO       [START ON 06/15/2024] Vitamin D  (Ergocalciferol ) (DRISDOL ) 1.25 MG (50000 UNIT) capsule 50,000 Units  50,000 Units Oral Q Austin Jens, Durand, MD        VITAL SIGNS: BP 110/68 (BP Location: Left Arm)   Pulse 98   Temp 99.7 F (37.6 C)   Resp 16   Ht 5' 9 (1.753 m)   Wt 145 lb (65.8 kg)   SpO2 96%   BMI 21.41 kg/m  Filed Weights   06/10/24 1145  Weight: 145 lb (65.8 kg)    Estimated body mass index is 21.41 kg/m as calculated from the following:   Height as of this encounter: 5' 9 (1.753 m).   Weight as of this encounter: 145 lb (65.8 kg).  LABS: CBC:    Component Value Date/Time   WBC 10.0 06/12/2024 0532   HGB 7.9 (L) 06/12/2024 0532   HGB 5.9 (LL) 06/04/2024 1250   HCT 25.1 (L) 06/12/2024 0532   PLT 278 06/12/2024 0532   PLT 264 06/04/2024 1250   MCV 88.1 06/12/2024 0532   NEUTROABS 5.4 06/04/2024 1250   LYMPHSABS 1.2 06/04/2024 1250   MONOABS 0.8 06/04/2024 1250   EOSABS 0.0 06/04/2024 1250   BASOSABS 0.0  06/04/2024 1250   Comprehensive Metabolic Panel:    Component Value Date/Time   NA 130 (L) 06/12/2024 0532   K 4.0 06/12/2024 0532   CL 96 (L) 06/12/2024 0532   CO2 26 06/12/2024 0532   BUN 9 06/12/2024 0532   CREATININE 0.64  06/12/2024 0532   CREATININE 0.84 05/20/2024 1413   GLUCOSE 109 (H) 06/12/2024 0532   CALCIUM 7.9 (L) 06/12/2024 0532   AST 31 06/10/2024 1237   AST 21 05/20/2024 1413   ALT 13 06/10/2024 1237   ALT 10 05/20/2024 1413   ALKPHOS 98 06/10/2024 1237   BILITOT 0.6 06/10/2024 1237   BILITOT 0.3 05/20/2024 1413   PROT 5.7 (L) 06/10/2024 1237   ALBUMIN  2.8 (L) 06/10/2024 1237    RADIOGRAPHIC STUDIES: CT Angio Abd/Pel W and/or Wo Contrast Result Date: 06/10/2024 CLINICAL DATA:  Metastatic ovarian cancer with worsening abdominal pain EXAM: CTA ABDOMEN AND PELVIS WITHOUT AND WITH CONTRAST TECHNIQUE: Multidetector CT imaging of the abdomen and pelvis was performed using the standard protocol during bolus administration of intravenous contrast. Multiplanar reconstructed images and MIPs were obtained and reviewed to evaluate the vascular anatomy. RADIATION DOSE REDUCTION: This exam was performed according to the departmental dose-optimization program which includes automated exposure control, adjustment of the mA and/or kV according to patient size and/or use of iterative reconstruction technique. CONTRAST:  OMNIPAQUE  IOHEXOL  350 MG/ML SOLN COMPARISON:  CTA chest dated 06/06/2024, CTA abdomen and pelvis dated 06/05/2024 and multiple priors FINDINGS: VASCULAR Aorta: Normal caliber aorta without aneurysm, dissection, vasculitis or significant stenosis. Mild aortic atherosclerosis. Celiac: Patent without evidence of aneurysm, dissection, vasculitis or significant stenosis. Prior gastroduodenal artery embolization. SMA: Patent without evidence of aneurysm, dissection, vasculitis or significant stenosis. Renals: Single bilateral renal arteries. Both renal arteries are patent without evidence of aneurysm, dissection, vasculitis, fibromuscular dysplasia or significant stenosis. IMA: Patent without evidence of aneurysm, dissection, vasculitis or significant stenosis. Inflow: Mild narrowing of the bilateral internal  iliac artery origins due to atherosclerotic plaque. Otherwise patent without evidence of aneurysm, dissection, vasculitis or significant stenosis. Proximal Outflow: Bilateral common femoral and visualized portions of the superficial and profunda femoral arteries are patent without evidence of aneurysm, dissection, vasculitis or significant stenosis. Veins: Interval placement of infrarenal IVC filter. Focal filling defect within the right great saphenous vein immediately distal to the saphenofemoral junction (16:197), which appears new compared to 06/05/2024. Filling defect within the left common femoral vein (16:210). Review of the MIP images confirms the above findings. NON-VASCULAR Lower chest: No focal consolidation or pulmonary nodule in the lung bases. No pleural effusion or pneumothorax demonstrated. Partially imaged heart size is normal. Unchanged partially imaged bilateral lower lobe pulmonary emboli. Unchanged 3.4 x 2.5 cm right cardiophrenic nodule (16: 14). Hepatobiliary: Again seen are heterogeneously enhancing bilobar hepatic masses. Index lesions: -3.8 x 2.8 cm segment 7/8 (16:38) unchanged when remeasured -9.5 x 6.2 cm segment 4 (16:29), previously 8.7 x 6.6 cm -6.2 x 4.5 cm segment 2 (16:27), previously 5.4 x 4.2 cm No intra or extrahepatic biliary ductal dilation. Gallbladder is laterally displaced. Mild gallbladder mural thickening and pericholecystic free fluid. Pancreas: No focal lesions or main ductal dilation. Spleen: Normal in size without focal abnormality. Adrenals/Urinary Tract: Unchanged right adrenal nodule size measuring 2.9 x 2.1 cm (16:51) when remeasured. Nodule measures 23 HU precontrast. No left adrenal nodule. Duplex kidneys. No suspicious renal mass or hydronephrosis. Unchanged linear 7 mm calculus at the left ureterovesical junction (16:171). No focal bladder wall thickening. Stomach/Bowel: Normal appearance  of the stomach. Again seen is heterogeneously enhancing mass within the  right hemiabdomen centered at the proximal duodenum measuring 11.0 x 8.6 cm (16:78), previously 9.8 x 9.2 cm when measured similarly. The mass continues exerts local mass effect with lateral displacement of the gallbladder and continued effacement of the SMV portal confluence. Findings suspicious for interval invasion of the proximal transverse colon with marked luminal narrowing. Along the periphery of this mass, there are areas of irregular arterial hyperenhancement, for example posteroinferior (9:110), which expands on the venous phase image (16:107). Appendix is not discretely seen. Lymphatic: Slightly increased size of right pelvic sidewall nodule measuring 1.6 x 1.4 cm (16:152), previously 1.5 x 1.2 cm. Unchanged 1.0 cm left para-aortic lymph node (16:86). Increased size of right lower quadrant nodule measuring 3.5 x 2.6 cm (16:130), previously 3 1 x 2.5 cm (remeasured). Reproductive: Status post hysterectomy and bilateral salpingo oophorectomy. Unchanged 2.0 x 1.8 cm nodular soft tissue focus, inseparable from the right vaginal cuff (16:163). Other: Trace right hemi abdominal free fluid. No fluid collection. Asymmetric thickening of the right anterior peritoneum adjacent to the duodenal mass. Musculoskeletal: No acute or abnormal lytic or blastic osseous lesions. Postsurgical changes of L4-S1 spinal fusion. Hardware appears intact. Subcutaneous soft tissue stranding and small focus of hyperdensity in the right gluteal region, likely postprocedural. Mild body wall edema. Small fat-containing paraumbilical hernia. IMPRESSION: 1. No acute aortic pathology. 2. Interval placement of infrarenal IVC filter. Filling defect within the right great saphenous vein immediately distal to the saphenofemoral femoral junction, which appears new compared to 06/05/2024, suspicious for thrombus. Filling defect within the left common femoral vein, consistent with known thrombus. 3. Unchanged partially imaged bilateral lower lobe  pulmonary emboli. 4. Increased size of the heterogeneously enhancing mass within the right hemiabdomen centered at the proximal duodenum with findings suspicious for interval invasion of the proximal transverse colon resulting in marked luminal narrowing. Along the periphery of this mass, there are areas of irregular arterial hyperenhancement, which expands on the venous phase image, favored to represent areas of progressive tumoral enhancement, although areas of active extravasation could have a similar appearance. 5. Bilobar hepatic metastases, many of which have increased in size. Increased size of right lower quadrant nodule and right pelvic sidewall nodule. 6. Unchanged right cardiophrenic and adrenal nodules and left para-aortic lymph node. 7. Unchanged linear 7 mm calculus at the left ureterovesical junction. No hydronephrosis. 8.  Aortic Atherosclerosis (ICD10-I70.0). Electronically Signed   By: Limin  Xu M.D.   On: 06/10/2024 15:48   DG Chest Portable 1 View Result Date: 06/10/2024 CLINICAL DATA:  Shortness of breath EXAM: PORTABLE CHEST 1 VIEW COMPARISON:  June 04, 2024 FINDINGS: The heart size and mediastinal contours are within normal limits. Right internal jugular Port-A-Cath is unchanged. Both lungs are clear. The visualized skeletal structures are unremarkable. IMPRESSION: No active disease. Electronically Signed   By: Lynwood Landy Raddle M.D.   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 EXAM: Title; COIL EMBOLIZATION OF GASTRODUODENAL ARTERY Listed procedures; 1.  ULTRASOUND-GUIDED RIGHT COMMON FEMORAL ARTERY ACCESS 2. MESENTERIC ARTERIOGRAPHY, including CELIAC, SUPERIOR MESENTERIC ARTERY and GASTRODUODENAL ARTERIOGRAMS 3. COIL EMBOLIZATION of the GASTRODUODENAL ARTERY 4. GELFOAM EMBOLIZATION of SUPERIOR MESENTERIC ARTERY  BRANCHES COMPARISON:  CT AP, 06/05/2024. MEDICATIONS: 2 g cefoxitin  IV. The antibiotics were administered within 60 minutes of procedural initiation. 4 mg  Zofran  IV.  50 mg albumin  IV ANESTHESIA/SEDATION: Moderate (conscious) sedation was employed during this procedure. A total of Versed  5 mg and Fentanyl  250 mcg was administered intravenously. Moderate Sedation Time: 90 minutes. The patient's level of consciousness and vital signs were monitored continuously by radiology nursing throughout the procedure under my direct supervision. CONTRAST:  140 mL Omnipaque  300 FLUOROSCOPY: Radiation Exposure Index and estimated peak skin dose (PSD); Reference air kerma (RAK), 429.3 mGy. COMPLICATIONS: None immediate. PROCEDURE: Informed consent was obtained from the patient and/or patient's representative following explanation of the procedure, risks, benefits and alternatives. All questions were addressed. A time out was performed prior to the initiation of the procedure. Maximal barrier sterile technique utilized including caps, mask, sterile gowns, sterile gloves, large sterile drape, hand hygiene, and chlorhexidine  prep. The RIGHT femoral head was marked fluoroscopically. Under sterile conditions and local anesthesia, the RIGHT common femoral artery access was performed with a micropuncture needle. Under direct ultrasound guidance, the RIGHT common femoral was accessed with a micropuncture kit. An ultrasound image was saved for documentation purposes. This allowed for placement of a 6 Fr 35 cm vascular sheath. A limited arteriogram was performed through the side arm of the sheath confirming appropriate access within the RIGHT common femoral artery. A limited abdominal aortogram was performed to help identify the celiac artery origin. Over a Bentson wire, a C2 catheter was advanced, back bled and flushed. The catheter was then utilized to select the celiac access, then advanced into the common hepatic then gastroduodenal  arteries. Selective mesenteric arteriograms were performed at each level. Using a 2.8 Fr Progreat microcatheter and 0.016 inch Fathom microwire access into the gastroduodenal artery was performed and a selective arteriogram was performed. Selective embolization with multiple 0.018 inch micro coils was performed. The microcatheter was removed and arteriogram with the 5 Fr catheter at the common hepatic artery was performed. Adequate pruning of the GDA was achieved on post embolization arteriogram. The C2 catheter was replaced for a Sos catheter, which was then used to select the superior mesenteric artery. Additional selective arteriogram was performed and access into the branches of the SMA, including the inferior pancreaticoduodenal and middle colic artery was obtained with the microcatheter. Gel-Foam embolization was then performed to near-stasis. Post embolization arteriogram demonstrated adequate pruning of the SMA branches supplying the tumor. Images were reviewed and the procedure was terminated. All wires, catheters and sheaths were removed from the patient. Hemostasis was achieved at the RIGHT groin access site with Angio-Seal closure. The patient tolerated the procedure well without immediate post procedural complication. FINDINGS: *Celiac and superior mesenteric arteriograms with normal order and branching. *Multivessel arterial supply to the large duodenal tumor, from the GDA and branches off the SMA, including the inferior pancreaticoduodenal and middle colic arteries. Tumor blush was identified, however no active extravasation was noted. *Coil embolization of the GDA, with additional Gelfoam embolization of the aforementioned SMA branches, with adequate pruning of the vasculature. IMPRESSION: 1. Mesenteric arteriography for duodenal mass, with arterial supply from the celiac axis at the GDA, and the SMA at the inferior pancreaticoduodenal and middle colic arteries. Tumor blush without evidence of active  extravasation from the duodenal mass. 2. Successful coil embolization of the gastroduodenal artery, and Gelfoam embolization of the SMA branches without significant residual tumoral enhancement. Thom Hall, MD [facility] Radiology Electronically Signed   By: Thom Hall M.D.   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 EXAM: Title; COIL EMBOLIZATION OF GASTRODUODENAL ARTERY Listed procedures; 1.  ULTRASOUND-GUIDED RIGHT COMMON FEMORAL ARTERY ACCESS 2. MESENTERIC ARTERIOGRAPHY, including CELIAC, SUPERIOR MESENTERIC ARTERY and GASTRODUODENAL ARTERIOGRAMS 3. COIL EMBOLIZATION of the GASTRODUODENAL ARTERY 4. GELFOAM EMBOLIZATION of SUPERIOR MESENTERIC ARTERY BRANCHES COMPARISON:  CT AP, 06/05/2024. MEDICATIONS: 2 g cefoxitin  IV. The antibiotics were administered within 60 minutes of procedural initiation. 4 mg Zofran  IV.  50 mg albumin  IV ANESTHESIA/SEDATION: Moderate (conscious) sedation was employed during this procedure. A total of Versed  5 mg and Fentanyl  250 mcg was administered intravenously. Moderate Sedation Time: 90 minutes. The patient's level of consciousness and vital signs were monitored continuously by radiology nursing throughout the procedure under my direct supervision. CONTRAST:  140 mL Omnipaque  300 FLUOROSCOPY: Radiation Exposure Index and estimated peak skin dose (PSD); Reference air kerma (RAK), 429.3 mGy. COMPLICATIONS: None immediate. PROCEDURE: Informed consent was obtained from the patient and/or patient's representative following explanation of the procedure, risks, benefits and alternatives. All questions were addressed. A time out was performed prior to the initiation of the procedure. Maximal barrier sterile technique utilized including caps, mask, sterile  gowns, sterile gloves, large sterile drape, hand hygiene, and chlorhexidine  prep. The RIGHT femoral head was marked fluoroscopically. Under sterile conditions and local anesthesia, the RIGHT common femoral artery access was performed with a micropuncture needle. Under direct ultrasound guidance, the RIGHT common femoral was accessed with a micropuncture kit. An ultrasound image was saved for documentation purposes. This allowed for placement of a 6 Fr 35 cm vascular sheath. A limited arteriogram was performed through the side arm of the sheath confirming appropriate access within the RIGHT common femoral artery. A limited abdominal aortogram was performed to help identify the celiac artery origin. Over a Bentson wire, a C2 catheter was advanced, back bled and flushed. The catheter was then utilized to select the celiac access, then advanced into the common hepatic then gastroduodenal arteries. Selective mesenteric arteriograms were performed at each level. Using a 2.8 Fr Progreat microcatheter and 0.016 inch Fathom microwire access into the gastroduodenal artery was performed and a selective arteriogram was performed. Selective embolization with multiple 0.018 inch micro coils was performed. The microcatheter was removed and arteriogram with the 5 Fr catheter at the common hepatic artery was performed. Adequate pruning of the GDA was achieved on post embolization arteriogram. The C2 catheter was replaced for a Sos catheter, which was then used to select the superior mesenteric artery. Additional selective arteriogram was performed and access into the branches of the SMA, including the inferior pancreaticoduodenal and middle colic artery was obtained with the microcatheter. Gel-Foam embolization was then performed to near-stasis. Post embolization arteriogram demonstrated adequate pruning of the SMA branches supplying the tumor. Images were reviewed and the procedure was terminated. All wires, catheters and sheaths  were removed from the patient. Hemostasis was achieved at the RIGHT groin access site with Angio-Seal closure. The patient tolerated the procedure well without immediate post procedural complication. FINDINGS: *Celiac and superior mesenteric arteriograms with normal order and branching. *Multivessel arterial supply to the large duodenal tumor, from the GDA and branches off the SMA, including the inferior pancreaticoduodenal and middle colic arteries. Tumor blush was identified, however no active extravasation was noted. *Coil embolization of the GDA, with additional Gelfoam embolization of the aforementioned SMA branches, with adequate pruning of the vasculature. IMPRESSION: 1. Mesenteric arteriography for duodenal mass, with arterial supply from the celiac axis at  the GDA, and the SMA at the inferior pancreaticoduodenal and middle colic arteries. Tumor blush without evidence of active extravasation from the duodenal mass. 2. Successful coil embolization of the gastroduodenal artery, and Gelfoam embolization of the SMA branches without significant residual tumoral enhancement. Thom Hall, MD [facility] Radiology Electronically Signed   By: Thom Hall M.D.   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 EXAM: Title; COIL EMBOLIZATION OF GASTRODUODENAL ARTERY Listed procedures; 1.  ULTRASOUND-GUIDED RIGHT COMMON FEMORAL ARTERY ACCESS 2. MESENTERIC ARTERIOGRAPHY, including CELIAC, SUPERIOR MESENTERIC ARTERY and GASTRODUODENAL ARTERIOGRAMS 3. COIL EMBOLIZATION of the GASTRODUODENAL ARTERY 4. GELFOAM EMBOLIZATION of SUPERIOR MESENTERIC ARTERY BRANCHES COMPARISON:  CT AP, 06/05/2024. MEDICATIONS: 2 g cefoxitin  IV. The antibiotics were administered within 60 minutes of procedural  initiation. 4 mg Zofran  IV.  50 mg albumin  IV ANESTHESIA/SEDATION: Moderate (conscious) sedation was employed during this procedure. A total of Versed  5 mg and Fentanyl  250 mcg was administered intravenously. Moderate Sedation Time: 90 minutes. The patient's level of consciousness and vital signs were monitored continuously by radiology nursing throughout the procedure under my direct supervision. CONTRAST:  140 mL Omnipaque  300 FLUOROSCOPY: Radiation Exposure Index and estimated peak skin dose (PSD); Reference air kerma (RAK), 429.3 mGy. COMPLICATIONS: None immediate. PROCEDURE: Informed consent was obtained from the patient and/or patient's representative following explanation of the procedure, risks, benefits and alternatives. All questions were addressed. A time out was performed prior to the initiation of the procedure. Maximal barrier sterile technique utilized including caps, mask, sterile gowns, sterile gloves, large sterile drape, hand hygiene, and chlorhexidine  prep. The RIGHT femoral head was marked fluoroscopically. Under sterile conditions and local anesthesia, the RIGHT common femoral artery access was performed with a micropuncture needle. Under direct ultrasound guidance, the RIGHT common femoral was accessed with a micropuncture kit. An ultrasound image was saved for documentation purposes. This allowed for placement of a 6 Fr 35 cm vascular sheath. A limited arteriogram was performed through the side arm of the sheath confirming appropriate access within the RIGHT common femoral artery. A limited abdominal aortogram was performed to help identify the celiac artery origin. Over a Bentson wire, a C2 catheter was advanced, back bled and flushed. The catheter was then utilized to select the celiac access, then advanced into the common hepatic then gastroduodenal arteries. Selective mesenteric arteriograms were performed at each level. Using a 2.8 Fr Progreat microcatheter and 0.016 inch Fathom  microwire access into the gastroduodenal artery was performed and a selective arteriogram was performed. Selective embolization with multiple 0.018 inch micro coils was performed. The microcatheter was removed and arteriogram with the 5 Fr catheter at the common hepatic artery was performed. Adequate pruning of the GDA was achieved on post embolization arteriogram. The C2 catheter was replaced for a Sos catheter, which was then used to select the superior mesenteric artery. Additional selective arteriogram was performed and access into the branches of the SMA, including the inferior pancreaticoduodenal and middle colic artery was obtained with the microcatheter. Gel-Foam embolization was then performed to near-stasis. Post embolization arteriogram demonstrated adequate pruning of the SMA branches supplying the tumor. Images were reviewed and the procedure was terminated. All wires, catheters and sheaths were removed from the patient. Hemostasis was achieved at the RIGHT groin access site with Angio-Seal closure. The patient tolerated the procedure well without immediate  post procedural complication. FINDINGS: *Celiac and superior mesenteric arteriograms with normal order and branching. *Multivessel arterial supply to the large duodenal tumor, from the GDA and branches off the SMA, including the inferior pancreaticoduodenal and middle colic arteries. Tumor blush was identified, however no active extravasation was noted. *Coil embolization of the GDA, with additional Gelfoam embolization of the aforementioned SMA branches, with adequate pruning of the vasculature. IMPRESSION: 1. Mesenteric arteriography for duodenal mass, with arterial supply from the celiac axis at the GDA, and the SMA at the inferior pancreaticoduodenal and middle colic arteries. Tumor blush without evidence of active extravasation from the duodenal mass. 2. Successful coil embolization of the gastroduodenal artery, and Gelfoam embolization of the  SMA branches without significant residual tumoral enhancement. Thom Hall, MD [facility] Radiology Electronically Signed   By: Thom Hall M.D.   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 EXAM: Title; COIL EMBOLIZATION OF GASTRODUODENAL ARTERY Listed procedures; 1.  ULTRASOUND-GUIDED RIGHT COMMON FEMORAL ARTERY ACCESS 2. MESENTERIC ARTERIOGRAPHY, including CELIAC, SUPERIOR MESENTERIC ARTERY and GASTRODUODENAL ARTERIOGRAMS 3. COIL EMBOLIZATION of the GASTRODUODENAL ARTERY 4. GELFOAM EMBOLIZATION of SUPERIOR MESENTERIC ARTERY BRANCHES COMPARISON:  CT AP, 06/05/2024. MEDICATIONS: 2 g cefoxitin  IV. The antibiotics were administered within 60 minutes of procedural initiation. 4 mg Zofran  IV.  50 mg albumin  IV ANESTHESIA/SEDATION: Moderate (conscious) sedation was employed during this procedure. A total of Versed  5 mg and Fentanyl  250 mcg was administered intravenously. Moderate Sedation Time: 90 minutes. The patient's level of consciousness and vital signs were monitored continuously by radiology nursing throughout the procedure under my direct supervision. CONTRAST:  140 mL Omnipaque  300 FLUOROSCOPY: Radiation Exposure Index and estimated peak skin dose (PSD); Reference air kerma (RAK), 429.3 mGy. COMPLICATIONS: None immediate. PROCEDURE: Informed consent was obtained from the patient and/or patient's representative following explanation of the procedure, risks, benefits and alternatives. All questions were addressed. A time out was performed prior to the initiation of the procedure. Maximal barrier sterile technique utilized including caps, mask, sterile gowns, sterile gloves, large sterile drape, hand hygiene, and chlorhexidine  prep. The RIGHT femoral head was marked  fluoroscopically. Under sterile conditions and local anesthesia, the RIGHT common femoral artery access was performed with a micropuncture needle. Under direct ultrasound guidance, the RIGHT common femoral was accessed with a micropuncture kit. An ultrasound image was saved for documentation purposes. This allowed for placement of a 6 Fr 35 cm vascular sheath. A limited arteriogram was performed through the side arm of the sheath confirming appropriate access within the RIGHT common femoral artery. A limited abdominal aortogram was performed to help identify the celiac artery origin. Over a Bentson wire, a C2 catheter was advanced, back bled and flushed. The catheter was then utilized to select the celiac access, then advanced into the common hepatic then gastroduodenal arteries. Selective mesenteric arteriograms were performed at each level. Using a 2.8 Fr Progreat microcatheter and 0.016 inch Fathom microwire access into the gastroduodenal artery was performed and a selective arteriogram was performed. Selective embolization with multiple 0.018 inch micro coils was performed. The microcatheter was removed and arteriogram with the 5 Fr catheter at the common hepatic artery was performed. Adequate pruning of the GDA was achieved on post embolization arteriogram. The C2 catheter was replaced for a Sos catheter, which was then used to select the superior mesenteric artery. Additional  selective arteriogram was performed and access into the branches of the SMA, including the inferior pancreaticoduodenal and middle colic artery was obtained with the microcatheter. Gel-Foam embolization was then performed to near-stasis. Post embolization arteriogram demonstrated adequate pruning of the SMA branches supplying the tumor. Images were reviewed and the procedure was terminated. All wires, catheters and sheaths were removed from the patient. Hemostasis was achieved at the RIGHT groin access site with Angio-Seal closure. The  patient tolerated the procedure well without immediate post procedural complication. FINDINGS: *Celiac and superior mesenteric arteriograms with normal order and branching. *Multivessel arterial supply to the large duodenal tumor, from the GDA and branches off the SMA, including the inferior pancreaticoduodenal and middle colic arteries. Tumor blush was identified, however no active extravasation was noted. *Coil embolization of the GDA, with additional Gelfoam embolization of the aforementioned SMA branches, with adequate pruning of the vasculature. IMPRESSION: 1. Mesenteric arteriography for duodenal mass, with arterial supply from the celiac axis at the GDA, and the SMA at the inferior pancreaticoduodenal and middle colic arteries. Tumor blush without evidence of active extravasation from the duodenal mass. 2. Successful coil embolization of the gastroduodenal artery, and Gelfoam embolization of the SMA branches without significant residual tumoral enhancement. Thom Hall, MD [facility] Radiology Electronically Signed   By: Thom Hall M.D.   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 EXAM: Title; COIL EMBOLIZATION OF GASTRODUODENAL ARTERY Listed procedures; 1.  ULTRASOUND-GUIDED RIGHT COMMON FEMORAL ARTERY ACCESS 2. MESENTERIC ARTERIOGRAPHY, including CELIAC, SUPERIOR MESENTERIC ARTERY and GASTRODUODENAL ARTERIOGRAMS 3. COIL EMBOLIZATION of the GASTRODUODENAL ARTERY 4. GELFOAM EMBOLIZATION of SUPERIOR MESENTERIC ARTERY BRANCHES COMPARISON:  CT AP, 06/05/2024. MEDICATIONS: 2 g cefoxitin  IV. The antibiotics were administered within 60 minutes of procedural initiation. 4 mg Zofran  IV.  50 mg albumin  IV ANESTHESIA/SEDATION: Moderate (conscious) sedation was  employed during this procedure. A total of Versed  5 mg and Fentanyl  250 mcg was administered intravenously. Moderate Sedation Time: 90 minutes. The patient's level of consciousness and vital signs were monitored continuously by radiology nursing throughout the procedure under my direct supervision. CONTRAST:  140 mL Omnipaque  300 FLUOROSCOPY: Radiation Exposure Index and estimated peak skin dose (PSD); Reference air kerma (RAK), 429.3 mGy. COMPLICATIONS: None immediate. PROCEDURE: Informed consent was obtained from the patient and/or patient's representative following explanation of the procedure, risks, benefits and alternatives. All questions were addressed. A time out was performed prior to the initiation of the procedure. Maximal barrier sterile technique utilized including caps, mask, sterile gowns, sterile gloves, large sterile drape, hand hygiene, and chlorhexidine  prep. The RIGHT femoral head was marked fluoroscopically. Under sterile conditions and local anesthesia, the RIGHT common femoral artery access was performed with a micropuncture needle. Under direct ultrasound guidance, the RIGHT common femoral was accessed with a micropuncture kit. An ultrasound image was saved for documentation purposes. This allowed for placement of a 6 Fr 35 cm vascular sheath. A limited arteriogram was performed through the side arm of the sheath confirming appropriate access within the RIGHT common femoral artery. A limited abdominal aortogram was performed to help identify the celiac artery origin. Over a Bentson wire, a C2 catheter was advanced, back bled and flushed. The catheter was then utilized to select the celiac access, then advanced into the common hepatic then gastroduodenal arteries. Selective mesenteric arteriograms were performed  at each level. Using a 2.8 Fr Progreat microcatheter and 0.016 inch Fathom microwire access into the gastroduodenal artery was performed and a selective arteriogram was performed.  Selective embolization with multiple 0.018 inch micro coils was performed. The microcatheter was removed and arteriogram with the 5 Fr catheter at the common hepatic artery was performed. Adequate pruning of the GDA was achieved on post embolization arteriogram. The C2 catheter was replaced for a Sos catheter, which was then used to select the superior mesenteric artery. Additional selective arteriogram was performed and access into the branches of the SMA, including the inferior pancreaticoduodenal and middle colic artery was obtained with the microcatheter. Gel-Foam embolization was then performed to near-stasis. Post embolization arteriogram demonstrated adequate pruning of the SMA branches supplying the tumor. Images were reviewed and the procedure was terminated. All wires, catheters and sheaths were removed from the patient. Hemostasis was achieved at the RIGHT groin access site with Angio-Seal closure. The patient tolerated the procedure well without immediate post procedural complication. FINDINGS: *Celiac and superior mesenteric arteriograms with normal order and branching. *Multivessel arterial supply to the large duodenal tumor, from the GDA and branches off the SMA, including the inferior pancreaticoduodenal and middle colic arteries. Tumor blush was identified, however no active extravasation was noted. *Coil embolization of the GDA, with additional Gelfoam embolization of the aforementioned SMA branches, with adequate pruning of the vasculature. IMPRESSION: 1. Mesenteric arteriography for duodenal mass, with arterial supply from the celiac axis at the GDA, and the SMA at the inferior pancreaticoduodenal and middle colic arteries. Tumor blush without evidence of active extravasation from the duodenal mass. 2. Successful coil embolization of the gastroduodenal artery, and Gelfoam embolization of the SMA branches without significant residual tumoral enhancement. Thom Hall, MD [facility] Electronically Signed   By: Thom Hall M.D.   On: 06/09/2024 14:52   VAS US  LOWER EXTREMITY VENOUS (DVT) Result Date: 06/09/2024  Lower Venous DVT Study Patient Name:  KRISI AZUA  Date of Exam:   06/06/2024 Medical Rec #: [id number]        Accession #:    [id number] Date of Birth: 1956/01/29       Patient Gender: F Patient Age:   54 years Exam Location:  [facility] Procedure:      VAS US  LOWER EXTREMITY VENOUS (DVT) Referring Phys: THOM HALL --------------------------------------------------------------------------------  Indications: Incidental pulmonary embolism on abdominal CTA 06/05/24 Other Indications: Status post mesenteric arteriography and duodenal mass                    embolization with IVC filter placement 06/06/24. 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 Limitations: Bandage right groin. Comparison Study: Prior negative left LEV done 05/29/22 Performing Technologist: Alberta Lis RVS  Examination Guidelines: A complete evaluation includes B-mode imaging, spectral Doppler, color Doppler, and power Doppler as needed of all accessible portions of each vessel. Bilateral testing is considered an integral part of a complete examination. Limited examinations for reoccurring indications may be performed as noted. The reflux portion of the exam is performed with the patient in reverse Trendelenburg.  +---------+---------------+---------+-----------+----------+--------------+ RIGHT    CompressibilityPhasicitySpontaneityPropertiesThrombus Aging +---------+---------------+---------+-----------+----------+--------------+ CFV      Full           Yes      No                                  +---------+---------------+---------+-----------+----------+--------------+  SFJ      Full                                                         +---------+---------------+---------+-----------+----------+--------------+ FV Prox  Full           Yes      No                                  +---------+---------------+---------+-----------+----------+--------------+ FV Mid   Full                                                        +---------+---------------+---------+-----------+----------+--------------+ FV DistalFull                                                        +---------+---------------+---------+-----------+----------+--------------+ PFV      Full           Yes      No                                  +---------+---------------+---------+-----------+----------+--------------+ POP      Full           Yes      No                                  +---------+---------------+---------+-----------+----------+--------------+ PTV      Full                                                        +---------+---------------+---------+-----------+----------+--------------+ PERO     Full                                                        +---------+---------------+---------+-----------+----------+--------------+   +---------+---------------+---------+-----------+----------+--------------+ LEFT     CompressibilityPhasicitySpontaneityPropertiesThrombus Aging +---------+---------------+---------+-----------+----------+--------------+ CFV      Full           Yes      No                                  +---------+---------------+---------+-----------+----------+--------------+ SFJ      Full                                                        +---------+---------------+---------+-----------+----------+--------------+  FV Prox  None           No       No                   Acute          +---------+---------------+---------+-----------+----------+--------------+ FV Mid   None           No       No                   Acute           +---------+---------------+---------+-----------+----------+--------------+ FV DistalNone           No       No                   Acute          +---------+---------------+---------+-----------+----------+--------------+ PFV      Full           Yes      No                                  +---------+---------------+---------+-----------+----------+--------------+ POP      None           No       No                   Acute          +---------+---------------+---------+-----------+----------+--------------+ PTV      None                                         Acute          +---------+---------------+---------+-----------+----------+--------------+ PERO     None                                         Acute          +---------+---------------+---------+-----------+----------+--------------+     Summary: RIGHT: - There is no evidence of deep vein thrombosis in the lower extremity.  - No cystic structure found in the popliteal fossa.  LEFT: - Findings consistent with acute deep vein thrombosis involving the left femoral vein, left popliteal vein, left posterior tibial veins, and left peroneal veins.  - No cystic structure found in the popliteal fossa.  *See table(s) above for measurements and observations. Electronically signed by Gaile New MD on 06/09/2024 at 10:45:02 AM.    Final    IR IVC FILTER PLMT / S&I PORTER GUID/MOD SED Result Date: 06/07/2024 INDICATION: duodenal mass Briefly, 69 year old female with a history of metastatic ovarian cancer, bleeding duodenal metastasis, and new, incidental PE and LEFT lower extremity DVT. Unable to anticoagulate. EXAM: Title; INFERIOR VENA CAVA (IVC) FILTER PLACEMENT Listed procedures; 1. ULTRASOUND-GUIDED RIGHT GREATER SAPHENOUS VEIN ACCESS 2. CENTRAL VENOGRAM 3. INFERIOR VENA CAVA FILTER PLACEMENT MEDICATIONS: None. ANESTHESIA/SEDATION: Moderate (conscious) sedation was employed during this procedure. A total of Versed  1 mg and Fentanyl   50 mcg was administered intravenously. Moderate Sedation Time: 18 minutes. The patient's level of consciousness and vital signs were monitored continuously by radiology nursing throughout the procedure under my direct supervision. CONTRAST:  60 mL Omnipaque  300 FLUOROSCOPY: Radiation Exposure Index and estimated  peak skin dose (PSD); Reference air kerma (RAK), 111.9 mGy. COMPLICATIONS: None immediate. PROCEDURE: Informed written consent was obtained from the patient and/or patient's representative following explanation of the procedure, risks, benefits and alternatives. A time out was performed prior to the initiation of the procedure. Maximal barrier sterile technique utilized including caps, mask, sterile gowns, sterile gloves, large sterile drape, hand hygiene, and sterile prep. Under sterile condition and local anesthesia, RIGHT greater saphenous vein access was performed with ultrasound. An ultrasound image was saved and sent to PACS. Over a guidewire, the IVC filter delivery sheath and inner dilator were advanced into the IVC just above the IVC bifurcation. Contrast injection was performed for an IVC venogram. Through the delivery sheath, a retrievable Denali IVC filter was deployed below the level of the renal veins and above the IVC bifurcation. Limited post deployment venacavagram was performed. The delivery sheath was removed and hemostasis was obtained with manual compression. A dressing was placed. The patient tolerated the procedure well without immediate post procedural complication. FINDINGS: The IVC is patent. No evidence of thrombus, stenosis, or occlusion. No variant venous anatomy. IMPRESSION: Successful placement of a retrievable infrarenal inferior vena cava (IVC) filter PLAN: IVC filters can cause complications when left in place for extended periods of time. If medically appropriate, recommend discontinuing filter prior to discharge. Please re-evaluate the patient for filter discontinuation  when they are seen in follow up, and refer patient to Interventional Radiology for removal. Thom Hall, MD [facility] Electronically Signed   By: Thom Hall M.D.   On: 06/07/2024 07:44   CT Angio Chest Pulmonary Embolism (PE) W or WO Contrast Result Date: 06/06/2024 CLINICAL DATA:  Acute pulmonary embolism, unspecified pulmonary embolism type, unspecified whether acute cor pulmonale present. Pulmonary embolism seen on CTA from 06/05/2024. EXAM: CT ANGIOGRAPHY CHEST WITH CONTRAST TECHNIQUE: Multidetector CT imaging of the chest was performed using the standard protocol during bolus administration of intravenous contrast. Multiplanar CT image reconstructions and MIPs were obtained to evaluate the vascular anatomy. RADIATION DOSE REDUCTION: This exam was performed according to the departmental dose-optimization program which includes automated exposure control, adjustment of the mA and/or kV according to patient size and/or use of iterative reconstruction technique. CONTRAST:  75mL OMNIPAQUE  IOHEXOL  350 MG/ML SOLN COMPARISON:  CTA abdomen pelvis 06/05/2024 FINDINGS: Cardiovascular: Positive for bilateral pulmonary emboli. Moderate clot burden with predominantly nonocclusive clot bilaterally. There is a small amount of clot in the distal right pulmonary artery extending into the truncus anterior and right interlobar artery. Clot extending into right segmental pulmonary arteries. Clot in the left lobar and segmental pulmonary arteries. No evidence for right heart strain. Normal caliber of the thoracic aorta. Arch great vessels are patent. Heart size is normal. No significant pericardial effusion. Embolization coils involving the gastroduodenal artery. Mediastinum/Nodes: Again noted is a soft tissue mass in the right anterior cardiophrenic fat measuring up to 3.4 cm. No significant mediastinal or hilar lymph node enlargement. No axillary lymph enlargement.  Lungs/Pleura: Trachea and mainstem bronchi are patent. No pleural effusions. Small parenchymal densities or airspace densities in the medial right upper lobe on image 51, sequence 7 are new since 2023. Findings could be associated with infarct versus small area of airspace disease. 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 These findings are poorly characterized on this chest CT. Right adrenal nodule. Musculoskeletal: No acute bone abnormality. Review of the MIP images confirms the above findings. IMPRESSION:  1. Positive for bilateral pulmonary emboli with moderate clot burden. Pulmonary emboli are predominantly nonocclusive and findings could be subacute. No evidence for right heart strain. 2. Parenchymal densities in the medial right lung could represent a small infarct versus small area of infection/inflammation. 3. Neoplastic disease in the upper abdomen. These results were called by telephone at the time of interpretation on 06/06/2024 at 2:04 pm to provider Dr. Hughes, Who verbally acknowledged these results. Electronically Signed   By: Juliene Balder M.D.   On: 06/06/2024 14:09   CT Angio Abd/Pel w/ and/or w/o Result Date: 06/06/2024 EXAM: CTA ABDOMEN AND PELVIS WITHOUT AND WITH CONTRAST 06/05/2024 02:49:17 PM TECHNIQUE: CTA images of the abdomen and pelvis without and with intravenous contrast. 125 mL (iopamidol  (ISOVUE -370) 76 % injection 125 mL IOPAMIDOL  (ISOVUE -370) INJECTION 76%) was administered. Three-dimensional MIP/volume rendered formations were performed. Automated exposure control, iterative reconstruction, and/or weight based adjustment of the mA/kV was utilized to reduce the radiation dose to as low as reasonably achievable. COMPARISON: Stable compared with the previous exam where applicable. New from prior exam where applicable. CLINICAL HISTORY: FINDINGS: VASCULATURE: AORTA: Aortic atherosclerotic calcification. No acute finding. No  abdominal aortic aneurysm. No dissection. CELIAC TRUNK: No acute finding. No occlusion or significant stenosis. SUPERIOR MESENTERIC ARTERY: No acute finding. No occlusion or significant stenosis. RENAL ARTERIES: No acute finding. No occlusion or significant stenosis. ILIAC ARTERIES: No acute finding. No occlusion or significant stenosis. LIVER: Bilobar liver metastases. Index lesion within segment 7 measures 2.9 x 2.4 cm, axial image 24/7. Index lesion within segment 4a measures 8.7 x 6.6 cm, image 16/7. The index lesion within segment 2 measures 5.4 x 4.2 cm, axial image 16/7. GALLBLADDER AND BILE DUCTS: Circumferential gallbladder wall thickening of the gallbladder measures 4 mm. No biliary ductal dilatation. SPLEEN: The spleen is within normal limits in size and appearance. PANCREAS: The pancreas is normal in size and contour without focal lesion or ductal dilatation. ADRENAL GLANDS: There is a right adrenal nodule measuring 2.1 x 1.8 cm and 9 Hounsfield units, which is stable compared with the previous exam. KIDNEYS, URETERS AND BLADDER: Persistent stone within the urinary bladder near the expected location of the left UVJ measuring 7 mm in length, image 75/7. There is no hydronephrosis identified. No stones in the kidneys or ureters. No perinephric or periureteral stranding. 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 This measures 10.7 x 8.9 x 7.3 cm. On the previous exam this measured 7.2 x 7.5 x 6.9 cm. Mass effect on the patent extrahepatic portal vein is again noted. On today's exam the mass extends up and abuts the surface of the gallbladder with loss of a fat plane between these two structures. The mass also extends up to the serosal surface of the transverse colon with loss of a fat plane between these two structures as well. There is no pathologic dilatation of the bowel loops to indicate a  mechanical obstruction. There is a moderate to large stool burden identified within the colon. No signs of active disease. No signs of intraluminal contrast extravasation to suggest active GI bleeding. REPRODUCTIVE: The uterus appears surgically absent. PERITONEUM AND RETRPERITONEUM: Signs of peritoneal metastases. Index a lesion within the right lower quadrant measures 3.1 cm (image 57/7). New from prior exam. Along the right parapelvic sidewall, there is a lesion measuring 1.2 cm, image 64/7. Midline periumbilical hernia contains fat only. No  ascites or free air. LUNG BASE: Filling defect within a segmental branch of the right lower lobe pulmonary artery, compatible with incidental pulmonary embolus, axial image 2/5. LYMPH NODES: Abdominal adenopathy is identified. The index left retroperitoneal node measures 1.1 cm, image 39/7. On the previous exam, this measured 7 mm. BONES AND SOFT TISSUES: Postoperative changes noted within the lumbar spine. No acute or suspicious osseous findings. No acute soft tissue abnormality. IMPRESSION: 1. No evidence of active gastrointestinal bleeding on this CTA abdomen and pelvis. 2. Incidental pulmonary embolism identified within a segmental branch of the right lower lobe pulmonary artery. 3. 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 4. Bilobar hepatic metastatic disease.  New from prior. 5. Progressive abdominal nodal metastatic disease. 6. Peritoneal metastatic disease, new from prior. Electronically signed by: Waddell Calk MD 06/06/2024 07:36 AM EST RP Workstation: [id number]   DG Chest Portable 1 View Result Date: 06/04/2024 EXAM: 1 VIEW(S) XRAY OF THE CHEST 06/04/2024 04:19:00 PM COMPARISON: 05/09/2024 CLINICAL HISTORY: Fever. FINDINGS: LINES, TUBES AND DEVICES: Right chest power injectable port in place with tip at the lower SVC. LUNGS AND  PLEURA: Panlike density favoring atelectasis along the left hemidiaphragm. No pleural effusion. No pneumothorax. HEART AND MEDIASTINUM: No acute abnormality of the cardiac and mediastinal silhouettes. BONES AND SOFT TISSUES: No acute osseous abnormality. JOINTS: Mild degenerative glenohumeral arthropathy bilaterally. IMPRESSION: 1. Mild left basilar atelectasis along the left hemidiaphragm. 2. Right chest power-injectable port with tip in the lower SVC. 3. Mild bilateral glenohumeral degenerative arthropathy. Electronically signed by: Ryan Salvage MD 06/04/2024 04:52 PM EST RP Workstation: [id number]   IR Radiologist Eval & Mgmt Result Date: 05/27/2024 EXAM: NEW PATIENT OFFICE VISIT CHIEF COMPLAINT: See below HISTORY OF PRESENT ILLNESS: See below REVIEW OF SYSTEMS: See below PHYSICAL EXAMINATION: See below ASSESSMENT AND PLAN: Please refer to completed note in the electronic medical record on [facility] [employer] Thom Hall, MD [facility] Electronically Signed   By: Thom Hall M.D.   On: 05/27/2024 09:02    PERFORMANCE STATUS (ECOG) : 1 - Symptomatic but completely ambulatory  Review of Systems Unless otherwise noted, a complete review of systems is negative.  Physical Exam General: NAD Pulmonary: Unlabored Extremities: no edema, no joint deformities Skin: no rashes Neurological: Weakness but otherwise nonfocal  IMPRESSION: Follow-up visit.  Patient accompanied by her sister.  Patient reports pain is slightly better today.  However, currently hurting and wants pain medication.  RN informed.  Patient pending IR evaluation for consideration of celiac plexus block.  PLAN: - Continue current scope of treatment - Pending IR evaluation - Continue OxyContin /oxycodone  - As needed hydromorphone  - Daily bowel regimen - Outpatient follow-up   Time Total: 15 minutes  Visit consisted of counseling and education dealing with the  complex and emotionally intense issues of symptom management and palliative care in the setting of serious and potentially life-threatening illness.Greater than 50%  of this time was spent counseling and coordinating care related to the above assessment and plan.  Signed by: Fonda Mower, PhD, NP-C   "

## 2024-06-12 NOTE — Plan of Care (Signed)
   Problem: Education: Goal: Knowledge of General Education information will improve Description Including pain rating scale, medication(s)/side effects and non-pharmacologic comfort measures Outcome: Progressing

## 2024-06-12 NOTE — Plan of Care (Signed)

## 2024-06-12 NOTE — Consult Note (Addendum)
 "    Chief Complaint: Patient was seen in consultation today for intractable abdominal pain, with consideration for celiac plexus block.  Referring Provider(s): Mr. Fonda Breslow, NP.   Supervising Physician: Philip Cornet  Patient Status: [facility] - In-pt  Current Code Status Limited: Do not attempt resuscitation (DNR) -DNR-LIMITED -Do Not Intubate/DNI - Set by Sherleen Fonda SAUNDERS, NP at 06/11/2024 1452 (View report)  Question Answer  If pulseless and not breathing No CPR or chest compressions.  In Pre-Arrest Conditions (Patient Is Breathing and Has A Pulse) Do not intubate. Provide all appropriate non-invasive medical interventions. Avoid ICU transfer unless indicated or required.  Consent: Discussion documented in EHR or advanced directives reviewed      History of Present Illness: [name] is a 68 y.o. female  with PMHx notable for metastatic ovarian cancer, MS, RA, HTN, Hep C s/p treatment, prediabetes, and others as delineated below.  Patient was initially diagnosed with bilateral ovarian masses on 04/2022. She is s/p chemotherapy (05/2022 - 10/2022) and total abdominal hysterectomy, bilateral salpingo-oophorectomy, appendectomy, rectosigmoid resection, total omentectomy, small bowel nodule resection. She was noted with metastatic recurrence by CT on 06/14/2023, and underwent chemotherapy (06/2023 - 11/2023). She is recommended for celiac plexus block by Palliative Medicine in an effort to optimally manage her severe abdominal pain.  Per Mr. Sherleen' progress note date 2/4: [...] Patient was hospitalized in November and December 2025 with GI bleed. She had EGD with findings of a bleeding mass, invading the small bowel.  Patient is status post embolization and IVC filter placement.  Patient was admitted to hospital on 06/10/2024 with worsening abdominal pain.  Patient has required frequent transfusions. She was referred to palliative care to discuss goals.  [...] Patient pending IR evaluation  for consideration of celiac plexus block.   PLAN: - Continue current scope of treatment - Pending IR evaluation - Continue OxyContin /oxycodone  - As needed hydromorphone  - Daily bowel regimen - Outpatient follow-up.   Interventional Radiology was requested for celiac plexus block. Request was reviewed and approved by Dr. Karalee. Patient is tentatively scheduled for same in IR tomorrow.   Patient is alert and laying in bed, calm. She is receiving a unit of PRBC during rounds. Her sister is present at her side, and another joins by phone. Patient is currently with significant abdominal pain, typically a 10/10 per her report. She has recently been administered prescribed analgesics, and currently rates her pain at a 6/10, tolerable, by her report. She adds that her pain typically worsens at night. She also notes persistent melena. Patient denies any fevers, headache, chest pain.      Past Medical History:  Diagnosis Date   Anxiety    Asthma    Back problem    disc disease, chronic low back pain, right leg pain, s/p discectomy 99   Cancer (HCC)    ovarian cancer   Depression    Diastolic dysfunction    with elevated LVEDP at cath   Endometriosis    Fibroids    hx of    GERD (gastroesophageal reflux disease)    Heart murmur    Hepatitis C    treated 8-9 years ago   Hidradenitis suppurativa    History of kidney stones    Hypertension    MS (multiple sclerosis)    Neuromuscular disorder (HCC)    Mulitple sclerosis   Osteoarthritis    Pre-diabetes    Prediabetes    RA (rheumatoid arthritis) (HCC)    Rotator cuff  tear    right shoulder    Syncope     Past Surgical History:  Procedure Laterality Date   ANGIOPLASTY     BACK SURGERY     lumbar surgery x 2 done in [place]  and [place]   COLON RESECTION SIGMOID  08/16/2022   Procedure: COLON RESECTION SIGMOID;  Surgeon: Viktoria Comer SAUNDERS, MD;  Location: [facility];  Service: Gynecology;;   COLONOSCOPY  07/2010    tubular adenoma   COLONOSCOPY  08/2015   CYSTOSCOPY N/A 08/16/2022   Procedure: CYSTOSCOPY;  Surgeon: Viktoria Comer SAUNDERS, MD;  Location: [facility];  Service: Gynecology;  Laterality: N/A;   DISKECTOMY     ESOPHAGOGASTRODUODENOSCOPY N/A 03/30/2024   Procedure: EGD (ESOPHAGOGASTRODUODENOSCOPY);  Surgeon: Maryruth Ole DASEN, MD;  Location: [facility] END[facility];  Service: Endoscopy;  Laterality: N/A;   HYSTERECTOMY ABDOMINAL WITH SALPINGO-OOPHORECTOMY  08/16/2022   Procedure: HYSTERECTOMY ABDOMINAL WITH SALPINGO-OOPHORECTOMY;  Surgeon: Viktoria Comer SAUNDERS, MD;  Location: [facility];  Service: Gynecology;;   IR ANGIOGRAM SELECTIVE EACH ADDITIONAL VESSEL  06/06/2024   IR ANGIOGRAM SELECTIVE EACH ADDITIONAL VESSEL  06/06/2024   IR ANGIOGRAM VISCERAL SELECTIVE  06/06/2024   IR EMBO TUMOR ORGAN ISCHEMIA INFARCT INC GUIDE ROADMAPPING  06/06/2024   IR IMAGING GUIDED PORT INSERTION  05/18/2022   IR IMAGING GUIDED PORT INSERTION  07/03/2023   IR IVC FILTER PLMT / S&I /IMG GUID/MOD SED  06/06/2024   IR RADIOLOGIST EVAL & MGMT  05/27/2024   IR REMOVAL TUN ACCESS W/ PORT W/O FL MOD SED  12/20/2022   IR US  GUIDE BX ASP/DRAIN  05/18/2022   IR US  GUIDE VASC ACCESS RIGHT  06/06/2024   LAPAROSCOPY N/A 08/16/2022   Procedure: LAPAROSCOPY DIAGNOSTIC;  Surgeon: Viktoria Comer SAUNDERS, MD;  Location: [facility];  Service: Gynecology;  Laterality: N/A;   laparoscopy for endometriosis     left shoulder scope     RADIOLOGY WITH ANESTHESIA N/A 05/18/2022   Procedure: IR WITH ANESTHESIA PORT AND BIOPSY;  Surgeon: Luverne Aran, MD;  Location: [facility];  Service: Radiology;  Laterality: N/A;   ROTATOR CUFF REPAIR Left    Spinal Fusion  12/2016   L4-S1    Allergies: Cymbalta [duloxetine hcl], Cyclobenzaprine hcl, Celexa  [citalopram hydrobromide], Fluticasone  furoate-vilanterol, Gabapentin, Lyrica  cr [pregabalin  er], Opana  [oxymorphone hcl], and Tramadol  Medications: Prior to Admission medications  Medication Sig Start Date End Date Taking?  Authorizing Provider  albuterol  (VENTOLIN  HFA) 108 (90 Base) MCG/ACT inhaler Inhale 2 puffs into the lungs every 6 (six) hours as needed for shortness of breath. 07/30/21  Yes [provider]  amphetamine -dextroamphetamine  (ADDERALL ) 20 MG tablet Take 10-20 mg by mouth daily as needed. Take 20 mg by mouth in the morning and 10 mg between 1-2 PM daily   Yes [provider]  Ascorbic Acid (VITAMIN C) 1000 MG tablet Take 1,000 mg by mouth daily.   Yes [provider]  ASHWAGANDHA PO Take 1 capsule by mouth daily.   Yes [provider]  atenolol  (TENORMIN ) 25 MG tablet Take 25 mg by mouth daily. 05/31/24  Yes [provider]  baclofen  (LIORESAL ) 10 MG tablet Take 10 mg by mouth 3 (three) times daily as needed.   Yes [provider]  BLACK CURRANT SEED OIL PO Take 1 capsule by mouth daily.   Yes [provider]  dexamethasone  (DECADRON ) 4 MG tablet Take 1 tablet (4 mg total) by mouth daily. 04/18/24  Yes Gorsuch, Ni, MD  diclofenac Sodium (VOLTAREN) 1 %  GEL Apply 2 g topically 3 (three) times daily.   Yes [provider]  hydroxychloroquine  (PLAQUENIL ) 200 MG tablet Take 200 mg by mouth 2 (two) times daily. 01/23/24  Yes [provider]  lidocaine -prilocaine  (EMLA ) cream Apply to affected area once 06/25/23  Yes Gorsuch, Ni, MD  LORazepam  (ATIVAN ) 0.5 MG tablet Take 0.5 mg by mouth 3 (three) times daily as needed for anxiety.   Yes [provider]  mirtazapine  (REMERON ) 7.5 MG tablet Take 7.5 mg by mouth at bedtime. 04/22/24  Yes [provider]  Multiple Vitamin (MULTIVITAMIN WITH MINERALS) TABS tablet Take 1 tablet by mouth daily.   Yes [provider]  naloxone  (NARCAN ) nasal spray 4 mg/0.1 mL Place 1 spray into the nose as needed (opioid overdose).   Yes [provider]  ondansetron  (ZOFRAN ) 8 MG tablet Take 1 tablet (8 mg total) by mouth every 8 (eight) hours as needed for nausea or  vomiting. 02/29/24  Yes Lonn Hicks, MD  OVER THE COUNTER MEDICATION Take 2 capsules by mouth daily. [place] advanced supplement   Yes [provider]  oxyCODONE  ER (XTAMPZA  ER) 13.5 MG C12A Take 13.5 mg by mouth in the morning, at noon, and at bedtime.   Yes [provider]  Oxycodone  HCl 20 MG TABS Take 20 mg by mouth 5 (five) times daily as needed (pain).   Yes [provider]  polyethylene glycol (MIRALAX  / GLYCOLAX ) 17 g packet Take 17 g by mouth daily.   Yes [provider]  prochlorperazine  (COMPAZINE ) 10 MG tablet Take 1 tablet (10 mg total) by mouth every 6 (six) hours as needed for nausea or vomiting. 02/29/24  Yes Gorsuch, Ni, MD  Vitamin D , Ergocalciferol , (DRISDOL ) 1.25 MG (50000 UNIT) CAPS capsule Take 50,000 Units by mouth every Sunday.   Yes [provider]  NAPOLEON INHUB 250-50 MCG/ACT AEPB Inhale 1 puff into the lungs daily.   Yes [provider]  omeprazole  (PRILOSEC) 20 MG capsule TAKE 1 CAPSULE BY MOUTH EVERY DAY 03/24/24   Lonn Hicks, MD     Family History  Problem Relation Age of Onset   Heart disease Mother        Died with MI 54, chest pain 30s   Hypertension Mother    Pancreatitis Father    Atrial fibrillation Sister    Breast cancer Maternal Aunt        dx <50   Stomach cancer Maternal Grandfather        mets to liver? dx after 50   Multiple sclerosis Cousin    Breast cancer Cousin        mat female cousin; dx unknown age   Colon cancer Cousin 104       mat female cousin; mets   Leukemia Cousin 13       mat female cousin    Social History   Socioeconomic History   Marital status: Single    Spouse name: Not on file   Number of children: 1   Years of education: Not on file   Highest education level: Not on file  Occupational History   Not on file  Tobacco Use   Smoking status: Some Days    Current packs/day: 0.00    Average packs/day: 0.1 packs/day for 27.0 years (2.7 ttl pk-yrs)    Types:  Cigarettes    Start date: 60    Last attempt to quit: 2023    Years since quitting: 3.0   Smokeless  tobacco: Never  Vaping Use   Vaping status: Never Used  Substance and Sexual Activity   Alcohol use: Not Currently   Drug use: Yes    Types: Oxycodone    Sexual activity: Not Currently  Other Topics Concern   Not on file  Social History Narrative   Lives alone   Right handed   Caffeine: none    Social Drivers of Health   Tobacco Use: High Risk (06/10/2024)   Patient History    Smoking Tobacco Use: Some Days    Smokeless Tobacco Use: Never    Passive Exposure: Not on file  Financial Resource Strain: Not on file  Food Insecurity: No Food Insecurity (06/11/2024)   Epic    Worried About Programme Researcher, Broadcasting/film/video in the Last Year: Never true    Ran Out of Food in the Last Year: Never true  Transportation Needs: No Transportation Needs (06/11/2024)   Epic    Lack of Transportation (Medical): No    Lack of Transportation (Non-Medical): No  Physical Activity: Not on file  Stress: Not on file  Social Connections: Socially Isolated (06/11/2024)   Social Connection and Isolation Panel    Frequency of Communication with Friends and Family: Three times a week    Frequency of Social Gatherings with Friends and Family: Never    Attends Religious Services: Never    Database Administrator or Organizations: No    Attends Banker Meetings: Never    Marital Status: Divorced  Depression (PHQ2-9): Low Risk (05/28/2024)   Depression (PHQ2-9)    PHQ-2 Score: 0  Alcohol Screen: Not on file  Housing: Low Risk (06/11/2024)   Epic    Unable to Pay for Housing in the Last Year: No    Number of Times Moved in the Last Year: 0    Homeless in the Last Year: No  Utilities: Not At Risk (06/11/2024)   Epic    Threatened with loss of utilities: No  Health Literacy: Not on file     Review of Systems: A 12 point ROS discussed and pertinent positives are indicated in the HPI above.  All other systems  are negative.  Vital Signs: BP 139/83 (BP Location: Right Arm)   Pulse (!) 102   Temp 98.1 F (36.7 C) (Oral)   Resp 20   Ht 5' 9 (1.753 m)   Wt 145 lb (65.8 kg)   SpO2 96%   BMI 21.41 kg/m   Advance Care Plan: The advanced care place/surrogate decision maker was discussed at the time of visit and the patient did not wish to discuss or was not able to name a surrogate decision maker or provide an advance care plan.  Physical Exam Constitutional:      General: She is not in acute distress.    Appearance: Normal appearance.  HENT:     Mouth/Throat:     Pharynx: Oropharynx is clear.  Cardiovascular:     Rate and Rhythm: Normal rate and regular rhythm.     Heart sounds: Normal heart sounds.  Pulmonary:     Effort: Pulmonary effort is normal.     Breath sounds: Normal breath sounds.  Abdominal:     General: There is no distension.     Tenderness: There is abdominal tenderness in the right lower quadrant, periumbilical area, suprapubic area and left lower quadrant. There is guarding.  Musculoskeletal:        General: Normal range of motion.  Skin:    General:  Skin is warm and dry.  Neurological:     Mental Status: She is alert and oriented to person, place, and time.  Psychiatric:        Mood and Affect: Mood normal.        Behavior: Behavior normal.        Thought Content: Thought content normal.        Judgment: Judgment normal.     Imaging: CT Angio Abd/Pel W and/or Wo Contrast Result Date: 06/10/2024 CLINICAL DATA:  Metastatic ovarian cancer with worsening abdominal pain EXAM: CTA ABDOMEN AND PELVIS WITHOUT AND WITH CONTRAST TECHNIQUE: Multidetector CT imaging of the abdomen and pelvis was performed using the standard protocol during bolus administration of intravenous contrast. Multiplanar reconstructed images and MIPs were obtained and reviewed to evaluate the vascular anatomy. RADIATION DOSE REDUCTION: This exam was performed according to the departmental  dose-optimization program which includes automated exposure control, adjustment of the mA and/or kV according to patient size and/or use of iterative reconstruction technique. CONTRAST:  OMNIPAQUE  IOHEXOL  350 MG/ML SOLN COMPARISON:  CTA chest dated 06/06/2024, CTA abdomen and pelvis dated 06/05/2024 and multiple priors FINDINGS: VASCULAR Aorta: Normal caliber aorta without aneurysm, dissection, vasculitis or significant stenosis. Mild aortic atherosclerosis. Celiac: Patent without evidence of aneurysm, dissection, vasculitis or significant stenosis. Prior gastroduodenal artery embolization. SMA: Patent without evidence of aneurysm, dissection, vasculitis or significant stenosis. Renals: Single bilateral renal arteries. Both renal arteries are patent without evidence of aneurysm, dissection, vasculitis, fibromuscular dysplasia or significant stenosis. IMA: Patent without evidence of aneurysm, dissection, vasculitis or significant stenosis. Inflow: Mild narrowing of the bilateral internal iliac artery origins due to atherosclerotic plaque. Otherwise patent without evidence of aneurysm, dissection, vasculitis or significant stenosis. Proximal Outflow: Bilateral common femoral and visualized portions of the superficial and profunda femoral arteries are patent without evidence of aneurysm, dissection, vasculitis or significant stenosis. Veins: Interval placement of infrarenal IVC filter. Focal filling defect within the right great saphenous vein immediately distal to the saphenofemoral junction (16:197), which appears new compared to 06/05/2024. Filling defect within the left common femoral vein (16:210). Review of the MIP images confirms the above findings. NON-VASCULAR Lower chest: No focal consolidation or pulmonary nodule in the lung bases. No pleural effusion or pneumothorax demonstrated. Partially imaged heart size is normal. Unchanged partially imaged bilateral lower lobe pulmonary emboli. Unchanged 3.4 x  2.5 cm right cardiophrenic nodule (16: 14). Hepatobiliary: Again seen are heterogeneously enhancing bilobar hepatic masses. Index lesions: -3.8 x 2.8 cm segment 7/8 (16:38) unchanged when remeasured -9.5 x 6.2 cm segment 4 (16:29), previously 8.7 x 6.6 cm -6.2 x 4.5 cm segment 2 (16:27), previously 5.4 x 4.2 cm No intra or extrahepatic biliary ductal dilation. Gallbladder is laterally displaced. Mild gallbladder mural thickening and pericholecystic free fluid. Pancreas: No focal lesions or main ductal dilation. Spleen: Normal in size without focal abnormality. Adrenals/Urinary Tract: Unchanged right adrenal nodule size measuring 2.9 x 2.1 cm (16:51) when remeasured. Nodule measures 23 HU precontrast. No left adrenal nodule. Duplex kidneys. No suspicious renal mass or hydronephrosis. Unchanged linear 7 mm calculus at the left ureterovesical junction (16:171). No focal bladder wall thickening. Stomach/Bowel: Normal appearance of the stomach. Again seen is heterogeneously enhancing mass within the right hemiabdomen centered at the proximal duodenum measuring 11.0 x 8.6 cm (16:78), previously 9.8 x 9.2 cm when measured similarly. The mass continues exerts local mass effect with lateral displacement of the gallbladder and continued effacement of the SMV portal confluence. Findings suspicious for interval invasion  of the proximal transverse colon with marked luminal narrowing. Along the periphery of this mass, there are areas of irregular arterial hyperenhancement, for example posteroinferior (9:110), which expands on the venous phase image (16:107). Appendix is not discretely seen. Lymphatic: Slightly increased size of right pelvic sidewall nodule measuring 1.6 x 1.4 cm (16:152), previously 1.5 x 1.2 cm. Unchanged 1.0 cm left para-aortic lymph node (16:86). Increased size of right lower quadrant nodule measuring 3.5 x 2.6 cm (16:130), previously 3 1 x 2.5 cm (remeasured). Reproductive: Status post hysterectomy and  bilateral salpingo oophorectomy. Unchanged 2.0 x 1.8 cm nodular soft tissue focus, inseparable from the right vaginal cuff (16:163). Other: Trace right hemi abdominal free fluid. No fluid collection. Asymmetric thickening of the right anterior peritoneum adjacent to the duodenal mass. Musculoskeletal: No acute or abnormal lytic or blastic osseous lesions. Postsurgical changes of L4-S1 spinal fusion. Hardware appears intact. Subcutaneous soft tissue stranding and small focus of hyperdensity in the right gluteal region, likely postprocedural. Mild body wall edema. Small fat-containing paraumbilical hernia. IMPRESSION: 1. No acute aortic pathology. 2. Interval placement of infrarenal IVC filter. Filling defect within the right great saphenous vein immediately distal to the saphenofemoral femoral junction, which appears new compared to 06/05/2024, suspicious for thrombus. Filling defect within the left common femoral vein, consistent with known thrombus. 3. Unchanged partially imaged bilateral lower lobe pulmonary emboli. 4. Increased size of the heterogeneously enhancing mass within the right hemiabdomen centered at the proximal duodenum with findings suspicious for interval invasion of the proximal transverse colon resulting in marked luminal narrowing. Along the periphery of this mass, there are areas of irregular arterial hyperenhancement, which expands on the venous phase image, favored to represent areas of progressive tumoral enhancement, although areas of active extravasation could have a similar appearance. 5. Bilobar hepatic metastases, many of which have increased in size. Increased size of right lower quadrant nodule and right pelvic sidewall nodule. 6. Unchanged right cardiophrenic and adrenal nodules and left para-aortic lymph node. 7. Unchanged linear 7 mm calculus at the left ureterovesical junction. No hydronephrosis. 8.  Aortic Atherosclerosis (ICD10-I70.0). Electronically Signed   By: Limin  Xu M.D.    On: 06/10/2024 15:48   DG Chest Portable 1 View Result Date: 06/10/2024 CLINICAL DATA:  Shortness of breath EXAM: PORTABLE CHEST 1 VIEW COMPARISON:  June 04, 2024 FINDINGS: The heart size and mediastinal contours are within normal limits. Right internal jugular Port-A-Cath is unchanged. Both lungs are clear. The visualized skeletal structures are unremarkable. IMPRESSION: No active disease. Electronically Signed   By: Lynwood Landy Raddle M.D.   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 EXAM: Title; COIL EMBOLIZATION OF GASTRODUODENAL ARTERY Listed procedures; 1.  ULTRASOUND-GUIDED RIGHT COMMON FEMORAL ARTERY ACCESS 2. MESENTERIC ARTERIOGRAPHY, including CELIAC, SUPERIOR MESENTERIC ARTERY and GASTRODUODENAL ARTERIOGRAMS 3. COIL EMBOLIZATION of the GASTRODUODENAL ARTERY 4. GELFOAM EMBOLIZATION of SUPERIOR MESENTERIC ARTERY BRANCHES COMPARISON:  CT AP, 06/05/2024. MEDICATIONS: 2 g cefoxitin  IV. The antibiotics were administered within 60 minutes of procedural initiation. 4 mg Zofran  IV.  50 mg albumin  IV ANESTHESIA/SEDATION: Moderate (conscious) sedation was employed during this procedure. A total of Versed  5 mg and Fentanyl  250 mcg was administered intravenously. Moderate Sedation Time: 90 minutes. The patient's level of consciousness and vital signs were monitored continuously by radiology nursing throughout the procedure under my direct supervision. CONTRAST:  140 mL  Omnipaque  300 FLUOROSCOPY: Radiation Exposure Index and estimated peak skin dose (PSD); Reference air kerma (RAK), 429.3 mGy. COMPLICATIONS: None immediate. PROCEDURE: Informed consent was obtained from the patient and/or patient's representative following explanation of the procedure, risks, benefits and alternatives. All questions were addressed. A time out was performed  prior to the initiation of the procedure. Maximal barrier sterile technique utilized including caps, mask, sterile gowns, sterile gloves, large sterile drape, hand hygiene, and chlorhexidine  prep. The RIGHT femoral head was marked fluoroscopically. Under sterile conditions and local anesthesia, the RIGHT common femoral artery access was performed with a micropuncture needle. Under direct ultrasound guidance, the RIGHT common femoral was accessed with a micropuncture kit. An ultrasound image was saved for documentation purposes. This allowed for placement of a 6 Fr 35 cm vascular sheath. A limited arteriogram was performed through the side arm of the sheath confirming appropriate access within the RIGHT common femoral artery. A limited abdominal aortogram was performed to help identify the celiac artery origin. Over a Bentson wire, a C2 catheter was advanced, back bled and flushed. The catheter was then utilized to select the celiac access, then advanced into the common hepatic then gastroduodenal arteries. Selective mesenteric arteriograms were performed at each level. Using a 2.8 Fr Progreat microcatheter and 0.016 inch Fathom microwire access into the gastroduodenal artery was performed and a selective arteriogram was performed. Selective embolization with multiple 0.018 inch micro coils was performed. The microcatheter was removed and arteriogram with the 5 Fr catheter at the common hepatic artery was performed. Adequate pruning of the GDA was achieved on post embolization arteriogram. The C2 catheter was replaced for a Sos catheter, which was then used to select the superior mesenteric artery. Additional selective arteriogram was performed and access into the branches of the SMA, including the inferior pancreaticoduodenal and middle colic artery was obtained with the microcatheter. Gel-Foam embolization was then performed to near-stasis. Post embolization arteriogram demonstrated adequate pruning of the SMA  branches supplying the tumor. Images were reviewed and the procedure was terminated. All wires, catheters and sheaths were removed from the patient. Hemostasis was achieved at the RIGHT groin access site with Angio-Seal closure. The patient tolerated the procedure well without immediate post procedural complication. FINDINGS: *Celiac and superior mesenteric arteriograms with normal order and branching. *Multivessel arterial supply to the large duodenal tumor, from the GDA and branches off the SMA, including the inferior pancreaticoduodenal and middle colic arteries. Tumor blush was identified, however no active extravasation was noted. *Coil embolization of the GDA, with additional Gelfoam embolization of the aforementioned SMA branches, with adequate pruning of the vasculature. IMPRESSION: 1. Mesenteric arteriography for duodenal mass, with arterial supply from the celiac axis at the GDA, and the SMA at the inferior pancreaticoduodenal and middle colic arteries. Tumor blush without evidence of active extravasation from the duodenal mass. 2. Successful coil embolization of the gastroduodenal artery, and Gelfoam embolization of the SMA branches without significant residual tumoral enhancement. Thom Hall, MD [facility] Radiology Electronically Signed   By: Thom Hall M.D.   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 EXAM: Title; COIL EMBOLIZATION OF GASTRODUODENAL ARTERY Listed procedures; 1.  ULTRASOUND-GUIDED RIGHT COMMON FEMORAL ARTERY ACCESS 2. MESENTERIC ARTERIOGRAPHY, including CELIAC, SUPERIOR MESENTERIC ARTERY and GASTRODUODENAL ARTERIOGRAMS 3. COIL EMBOLIZATION of the GASTRODUODENAL ARTERY 4. GELFOAM EMBOLIZATION of SUPERIOR MESENTERIC ARTERY BRANCHES  COMPARISON:  CT AP,  06/05/2024. MEDICATIONS: 2 g cefoxitin  IV. The antibiotics were administered within 60 minutes of procedural initiation. 4 mg Zofran  IV.  50 mg albumin  IV ANESTHESIA/SEDATION: Moderate (conscious) sedation was employed during this procedure. A total of Versed  5 mg and Fentanyl  250 mcg was administered intravenously. Moderate Sedation Time: 90 minutes. The patient's level of consciousness and vital signs were monitored continuously by radiology nursing throughout the procedure under my direct supervision. CONTRAST:  140 mL Omnipaque  300 FLUOROSCOPY: Radiation Exposure Index and estimated peak skin dose (PSD); Reference air kerma (RAK), 429.3 mGy. COMPLICATIONS: None immediate. PROCEDURE: Informed consent was obtained from the patient and/or patient's representative following explanation of the procedure, risks, benefits and alternatives. All questions were addressed. A time out was performed prior to the initiation of the procedure. Maximal barrier sterile technique utilized including caps, mask, sterile gowns, sterile gloves, large sterile drape, hand hygiene, and chlorhexidine  prep. The RIGHT femoral head was marked fluoroscopically. Under sterile conditions and local anesthesia, the RIGHT common femoral artery access was performed with a micropuncture needle. Under direct ultrasound guidance, the RIGHT common femoral was accessed with a micropuncture kit. An ultrasound image was saved for documentation purposes. This allowed for placement of a 6 Fr 35 cm vascular sheath. A limited arteriogram was performed through the side arm of the sheath confirming appropriate access within the RIGHT common femoral artery. A limited abdominal aortogram was performed to help identify the celiac artery origin. Over a Bentson wire, a C2 catheter was advanced, back bled and flushed. The catheter was then utilized to select the celiac access, then advanced into the common hepatic then gastroduodenal arteries. Selective mesenteric  arteriograms were performed at each level. Using a 2.8 Fr Progreat microcatheter and 0.016 inch Fathom microwire access into the gastroduodenal artery was performed and a selective arteriogram was performed. Selective embolization with multiple 0.018 inch micro coils was performed. The microcatheter was removed and arteriogram with the 5 Fr catheter at the common hepatic artery was performed. Adequate pruning of the GDA was achieved on post embolization arteriogram. The C2 catheter was replaced for a Sos catheter, which was then used to select the superior mesenteric artery. Additional selective arteriogram was performed and access into the branches of the SMA, including the inferior pancreaticoduodenal and middle colic artery was obtained with the microcatheter. Gel-Foam embolization was then performed to near-stasis. Post embolization arteriogram demonstrated adequate pruning of the SMA branches supplying the tumor. Images were reviewed and the procedure was terminated. All wires, catheters and sheaths were removed from the patient. Hemostasis was achieved at the RIGHT groin access site with Angio-Seal closure. The patient tolerated the procedure well without immediate post procedural complication. FINDINGS: *Celiac and superior mesenteric arteriograms with normal order and branching. *Multivessel arterial supply to the large duodenal tumor, from the GDA and branches off the SMA, including the inferior pancreaticoduodenal and middle colic arteries. Tumor blush was identified, however no active extravasation was noted. *Coil embolization of the GDA, with additional Gelfoam embolization of the aforementioned SMA branches, with adequate pruning of the vasculature. IMPRESSION: 1. Mesenteric arteriography for duodenal mass, with arterial supply from the celiac axis at the GDA, and the SMA at the inferior pancreaticoduodenal and middle colic arteries. Tumor blush without evidence of active extravasation from the  duodenal mass. 2. Successful coil embolization of the gastroduodenal artery, and Gelfoam embolization of the SMA branches without significant residual tumoral enhancement. Thom Hall, MD [facility] Radiology Electronically Signed  By: Thom Hall M.D.   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 EXAM: Title; COIL EMBOLIZATION OF GASTRODUODENAL ARTERY Listed procedures; 1.  ULTRASOUND-GUIDED RIGHT COMMON FEMORAL ARTERY ACCESS 2. MESENTERIC ARTERIOGRAPHY, including CELIAC, SUPERIOR MESENTERIC ARTERY and GASTRODUODENAL ARTERIOGRAMS 3. COIL EMBOLIZATION of the GASTRODUODENAL ARTERY 4. GELFOAM EMBOLIZATION of SUPERIOR MESENTERIC ARTERY BRANCHES COMPARISON:  CT AP, 06/05/2024. MEDICATIONS: 2 g cefoxitin  IV. The antibiotics were administered within 60 minutes of procedural initiation. 4 mg Zofran  IV.  50 mg albumin  IV ANESTHESIA/SEDATION: Moderate (conscious) sedation was employed during this procedure. A total of Versed  5 mg and Fentanyl  250 mcg was administered intravenously. Moderate Sedation Time: 90 minutes. The patient's level of consciousness and vital signs were monitored continuously by radiology nursing throughout the procedure under my direct supervision. CONTRAST:  140 mL Omnipaque  300 FLUOROSCOPY: Radiation Exposure Index and estimated peak skin dose (PSD); Reference air kerma (RAK), 429.3 mGy. COMPLICATIONS: None immediate. PROCEDURE: Informed consent was obtained from the patient and/or patient's representative following explanation of the procedure, risks, benefits and alternatives. All questions were addressed. A time out was performed prior to the initiation of the procedure. Maximal barrier sterile technique utilized including caps, mask, sterile gowns, sterile  gloves, large sterile drape, hand hygiene, and chlorhexidine  prep. The RIGHT femoral head was marked fluoroscopically. Under sterile conditions and local anesthesia, the RIGHT common femoral artery access was performed with a micropuncture needle. Under direct ultrasound guidance, the RIGHT common femoral was accessed with a micropuncture kit. An ultrasound image was saved for documentation purposes. This allowed for placement of a 6 Fr 35 cm vascular sheath. A limited arteriogram was performed through the side arm of the sheath confirming appropriate access within the RIGHT common femoral artery. A limited abdominal aortogram was performed to help identify the celiac artery origin. Over a Bentson wire, a C2 catheter was advanced, back bled and flushed. The catheter was then utilized to select the celiac access, then advanced into the common hepatic then gastroduodenal arteries. Selective mesenteric arteriograms were performed at each level. Using a 2.8 Fr Progreat microcatheter and 0.016 inch Fathom microwire access into the gastroduodenal artery was performed and a selective arteriogram was performed. Selective embolization with multiple 0.018 inch micro coils was performed. The microcatheter was removed and arteriogram with the 5 Fr catheter at the common hepatic artery was performed. Adequate pruning of the GDA was achieved on post embolization arteriogram. The C2 catheter was replaced for a Sos catheter, which was then used to select the superior mesenteric artery. Additional selective arteriogram was performed and access into the branches of the SMA, including the inferior pancreaticoduodenal and middle colic artery was obtained with the microcatheter. Gel-Foam embolization was then performed to near-stasis. Post embolization arteriogram demonstrated adequate pruning of the SMA branches supplying the tumor. Images were reviewed and the procedure was terminated. All wires, catheters and sheaths were removed  from the patient. Hemostasis was achieved at the RIGHT groin access site with Angio-Seal closure. The patient tolerated the procedure well without immediate post procedural complication. FINDINGS: *Celiac and superior mesenteric arteriograms with normal order and branching. *Multivessel arterial supply to the large duodenal tumor, from the GDA and branches off the SMA, including the inferior pancreaticoduodenal and middle colic arteries. Tumor blush was identified, however no active extravasation was noted. *Coil embolization of the GDA, with additional Gelfoam embolization of  the aforementioned SMA branches, with adequate pruning of the vasculature. IMPRESSION: 1. Mesenteric arteriography for duodenal mass, with arterial supply from the celiac axis at the GDA, and the SMA at the inferior pancreaticoduodenal and middle colic arteries. Tumor blush without evidence of active extravasation from the duodenal mass. 2. Successful coil embolization of the gastroduodenal artery, and Gelfoam embolization of the SMA branches without significant residual tumoral enhancement. Thom Hall, MD [facility] Radiology Electronically Signed   By: Thom Hall M.D.   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 EXAM: Title; COIL EMBOLIZATION OF GASTRODUODENAL ARTERY Listed procedures; 1.  ULTRASOUND-GUIDED RIGHT COMMON FEMORAL ARTERY ACCESS 2. MESENTERIC ARTERIOGRAPHY, including CELIAC, SUPERIOR MESENTERIC ARTERY and GASTRODUODENAL ARTERIOGRAMS 3. COIL EMBOLIZATION of the GASTRODUODENAL ARTERY 4. GELFOAM EMBOLIZATION of SUPERIOR MESENTERIC ARTERY BRANCHES COMPARISON:  CT AP, 06/05/2024. MEDICATIONS: 2 g cefoxitin  IV. The antibiotics were administered within 60 minutes of procedural initiation. 4  mg Zofran  IV.  50 mg albumin  IV ANESTHESIA/SEDATION: Moderate (conscious) sedation was employed during this procedure. A total of Versed  5 mg and Fentanyl  250 mcg was administered intravenously. Moderate Sedation Time: 90 minutes. The patient's level of consciousness and vital signs were monitored continuously by radiology nursing throughout the procedure under my direct supervision. CONTRAST:  140 mL Omnipaque  300 FLUOROSCOPY: Radiation Exposure Index and estimated peak skin dose (PSD); Reference air kerma (RAK), 429.3 mGy. COMPLICATIONS: None immediate. PROCEDURE: Informed consent was obtained from the patient and/or patient's representative following explanation of the procedure, risks, benefits and alternatives. All questions were addressed. A time out was performed prior to the initiation of the procedure. Maximal barrier sterile technique utilized including caps, mask, sterile gowns, sterile gloves, large sterile drape, hand hygiene, and chlorhexidine  prep. The RIGHT femoral head was marked fluoroscopically. Under sterile conditions and local anesthesia, the RIGHT common femoral artery access was performed with a micropuncture needle. Under direct ultrasound guidance, the RIGHT common femoral was accessed with a micropuncture kit. An ultrasound image was saved for documentation purposes. This allowed for placement of a 6 Fr 35 cm vascular sheath. A limited arteriogram was performed through the side arm of the sheath confirming appropriate access within the RIGHT common femoral artery. A limited abdominal aortogram was performed to help identify the celiac artery origin. Over a Bentson wire, a C2 catheter was advanced, back bled and flushed. The catheter was then utilized to select the celiac access, then advanced into the common hepatic then gastroduodenal arteries. Selective mesenteric arteriograms were performed at each level. Using a 2.8 Fr Progreat microcatheter and 0.016 inch Fathom microwire access into  the gastroduodenal artery was performed and a selective arteriogram was performed. Selective embolization with multiple 0.018 inch micro coils was performed. The microcatheter was removed and arteriogram with the 5 Fr catheter at the common hepatic artery was performed. Adequate pruning of the GDA was achieved on post embolization arteriogram. The C2 catheter was replaced for a Sos catheter, which was then used to select the superior mesenteric artery. Additional selective arteriogram was performed and access into the branches of the SMA, including the inferior pancreaticoduodenal and middle colic artery was obtained with the microcatheter. Gel-Foam embolization was then performed to near-stasis. Post embolization arteriogram demonstrated adequate pruning of the SMA branches supplying the tumor. Images were reviewed and the procedure was terminated. All wires, catheters and sheaths  were removed from the patient. Hemostasis was achieved at the RIGHT groin access site with Angio-Seal closure. The patient tolerated the procedure well without immediate post procedural complication. FINDINGS: *Celiac and superior mesenteric arteriograms with normal order and branching. *Multivessel arterial supply to the large duodenal tumor, from the GDA and branches off the SMA, including the inferior pancreaticoduodenal and middle colic arteries. Tumor blush was identified, however no active extravasation was noted. *Coil embolization of the GDA, with additional Gelfoam embolization of the aforementioned SMA branches, with adequate pruning of the vasculature. IMPRESSION: 1. Mesenteric arteriography for duodenal mass, with arterial supply from the celiac axis at the GDA, and the SMA at the inferior pancreaticoduodenal and middle colic arteries. Tumor blush without evidence of active extravasation from the duodenal mass. 2. Successful coil embolization of the gastroduodenal artery, and Gelfoam embolization of the SMA branches without  significant residual tumoral enhancement. Thom Hall, MD [facility] Radiology Electronically Signed   By: Thom Hall M.D.   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 EXAM: Title; COIL EMBOLIZATION OF GASTRODUODENAL ARTERY Listed procedures; 1.  ULTRASOUND-GUIDED RIGHT COMMON FEMORAL ARTERY ACCESS 2. MESENTERIC ARTERIOGRAPHY, including CELIAC, SUPERIOR MESENTERIC ARTERY and GASTRODUODENAL ARTERIOGRAMS 3. COIL EMBOLIZATION of the GASTRODUODENAL ARTERY 4. GELFOAM EMBOLIZATION of SUPERIOR MESENTERIC ARTERY BRANCHES COMPARISON:  CT AP, 06/05/2024. MEDICATIONS: 2 g cefoxitin  IV. The antibiotics were administered within 60 minutes of procedural initiation. 4 mg Zofran  IV.  50 mg albumin  IV ANESTHESIA/SEDATION: Moderate (conscious) sedation was employed during this procedure. A total of Versed  5 mg and Fentanyl  250 mcg was administered intravenously. Moderate Sedation Time: 90 minutes. The patient's level of consciousness and vital signs were monitored continuously by radiology nursing throughout the procedure under my direct supervision. CONTRAST:  140 mL Omnipaque  300 FLUOROSCOPY: Radiation Exposure Index and estimated peak skin dose (PSD); Reference air kerma (RAK), 429.3 mGy. COMPLICATIONS: None immediate. PROCEDURE: Informed consent was obtained from the patient and/or patient's representative following explanation of the procedure, risks, benefits and alternatives. All questions were addressed. A time out was performed prior to the initiation of the procedure. Maximal barrier sterile technique utilized including caps, mask, sterile gowns, sterile gloves, large sterile drape, hand hygiene, and chlorhexidine  prep. The RIGHT femoral head was marked  fluoroscopically. Under sterile conditions and local anesthesia, the RIGHT common femoral artery access was performed with a micropuncture needle. Under direct ultrasound guidance, the RIGHT common femoral was accessed with a micropuncture kit. An ultrasound image was saved for documentation purposes. This allowed for placement of a 6 Fr 35 cm vascular sheath. A limited arteriogram was performed through the side arm of the sheath confirming appropriate access within the RIGHT common femoral artery. A limited abdominal aortogram was performed to help identify the celiac artery origin. Over a Bentson wire, a C2 catheter was advanced, back bled and flushed. The catheter was then utilized to select the celiac access, then advanced into the common hepatic then gastroduodenal arteries. Selective mesenteric arteriograms were performed at each level. Using a 2.8 Fr Progreat microcatheter and 0.016 inch Fathom microwire access into the gastroduodenal artery was performed and a selective arteriogram was performed. Selective embolization with multiple 0.018 inch micro coils was performed. The microcatheter was removed and arteriogram with the 5 Fr catheter at the common hepatic artery was performed. Adequate pruning of  the GDA was achieved on post embolization arteriogram. The C2 catheter was replaced for a Sos catheter, which was then used to select the superior mesenteric artery. Additional selective arteriogram was performed and access into the branches of the SMA, including the inferior pancreaticoduodenal and middle colic artery was obtained with the microcatheter. Gel-Foam embolization was then performed to near-stasis. Post embolization arteriogram demonstrated adequate pruning of the SMA branches supplying the tumor. Images were reviewed and the procedure was terminated. All wires, catheters and sheaths were removed from the patient. Hemostasis was achieved at the RIGHT groin access site with Angio-Seal closure. The  patient tolerated the procedure well without immediate post procedural complication. FINDINGS: *Celiac and superior mesenteric arteriograms with normal order and branching. *Multivessel arterial supply to the large duodenal tumor, from the GDA and branches off the SMA, including the inferior pancreaticoduodenal and middle colic arteries. Tumor blush was identified, however no active extravasation was noted. *Coil embolization of the GDA, with additional Gelfoam embolization of the aforementioned SMA branches, with adequate pruning of the vasculature. IMPRESSION: 1. Mesenteric arteriography for duodenal mass, with arterial supply from the celiac axis at the GDA, and the SMA at the inferior pancreaticoduodenal and middle colic arteries. Tumor blush without evidence of active extravasation from the duodenal mass. 2. Successful coil embolization of the gastroduodenal artery, and Gelfoam embolization of the SMA branches without significant residual tumoral enhancement. Thom Hall, MD [facility] Radiology Electronically Signed   By: Thom Hall M.D.   On: 06/09/2024 14:52   VAS US  LOWER EXTREMITY VENOUS (DVT) Result Date: 06/09/2024  Lower Venous DVT Study Patient Name:  [name]  Date of Exam:   06/06/2024 Medical Rec #: [id number]        Accession #:    [id number] Date of Birth: 01-May-1956       Patient Gender: F Patient Age:   35 years Exam Location:  [facility] Procedure:      VAS US  LOWER EXTREMITY VENOUS (DVT) Referring Phys: THOM HALL --------------------------------------------------------------------------------  Indications: Incidental pulmonary embolism on abdominal CTA 06/05/24 Other Indications: Status post mesenteric arteriography and duodenal mass                    embolization with IVC filter placement 06/06/24. 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 Limitations: Bandage right groin.  Comparison Study: Prior negative left LEV done 05/29/22 Performing Technologist: Alberta Lis RVS  Examination Guidelines: A complete evaluation includes B-mode imaging, spectral Doppler, color Doppler, and power Doppler as needed of all accessible portions of each vessel. Bilateral testing is considered an integral part of a complete examination. Limited examinations for reoccurring indications may be performed as noted. The reflux portion of the exam is performed with the patient in reverse Trendelenburg.  +---------+---------------+---------+-----------+----------+--------------+ RIGHT    CompressibilityPhasicitySpontaneityPropertiesThrombus Aging +---------+---------------+---------+-----------+----------+--------------+ CFV      Full           Yes      No                                  +---------+---------------+---------+-----------+----------+--------------+ SFJ      Full                                                        +---------+---------------+---------+-----------+----------+--------------+  FV Prox  Full           Yes      No                                  +---------+---------------+---------+-----------+----------+--------------+ FV Mid   Full                                                        +---------+---------------+---------+-----------+----------+--------------+ FV DistalFull                                                        +---------+---------------+---------+-----------+----------+--------------+ PFV      Full           Yes      No                                  +---------+---------------+---------+-----------+----------+--------------+ POP      Full           Yes      No                                  +---------+---------------+---------+-----------+----------+--------------+ PTV      Full                                                         +---------+---------------+---------+-----------+----------+--------------+ PERO     Full                                                        +---------+---------------+---------+-----------+----------+--------------+   +---------+---------------+---------+-----------+----------+--------------+ LEFT     CompressibilityPhasicitySpontaneityPropertiesThrombus Aging +---------+---------------+---------+-----------+----------+--------------+ CFV      Full           Yes      No                                  +---------+---------------+---------+-----------+----------+--------------+ SFJ      Full                                                        +---------+---------------+---------+-----------+----------+--------------+ FV Prox  None           No       No                   Acute          +---------+---------------+---------+-----------+----------+--------------+ FV  Mid   None           No       No                   Acute          +---------+---------------+---------+-----------+----------+--------------+ FV DistalNone           No       No                   Acute          +---------+---------------+---------+-----------+----------+--------------+ PFV      Full           Yes      No                                  +---------+---------------+---------+-----------+----------+--------------+ POP      None           No       No                   Acute          +---------+---------------+---------+-----------+----------+--------------+ PTV      None                                         Acute          +---------+---------------+---------+-----------+----------+--------------+ PERO     None                                         Acute          +---------+---------------+---------+-----------+----------+--------------+     Summary: RIGHT: - There is no evidence of deep vein thrombosis in the lower extremity.  - No cystic structure found in  the popliteal fossa.  LEFT: - Findings consistent with acute deep vein thrombosis involving the left femoral vein, left popliteal vein, left posterior tibial veins, and left peroneal veins.  - No cystic structure found in the popliteal fossa.  *See table(s) above for measurements and observations. Electronically signed by Gaile New MD on 06/09/2024 at 10:45:02 AM.    Final    IR IVC FILTER PLMT / S&I PORTER GUID/MOD SED Result Date: 06/07/2024 INDICATION: duodenal mass Briefly, 69 year old female with a history of metastatic ovarian cancer, bleeding duodenal metastasis, and new, incidental PE and LEFT lower extremity DVT. Unable to anticoagulate. EXAM: Title; INFERIOR VENA CAVA (IVC) FILTER PLACEMENT Listed procedures; 1. ULTRASOUND-GUIDED RIGHT GREATER SAPHENOUS VEIN ACCESS 2. CENTRAL VENOGRAM 3. INFERIOR VENA CAVA FILTER PLACEMENT MEDICATIONS: None. ANESTHESIA/SEDATION: Moderate (conscious) sedation was employed during this procedure. A total of Versed  1 mg and Fentanyl  50 mcg was administered intravenously. Moderate Sedation Time: 18 minutes. The patient's level of consciousness and vital signs were monitored continuously by radiology nursing throughout the procedure under my direct supervision. CONTRAST:  60 mL Omnipaque  300 FLUOROSCOPY: Radiation Exposure Index and estimated peak skin dose (PSD); Reference air kerma (RAK), 111.9 mGy. COMPLICATIONS: None immediate. PROCEDURE: Informed written consent was obtained from the patient and/or patient's representative following explanation of the procedure, risks, benefits and alternatives. A time out was performed prior to the initiation of the procedure. Maximal barrier sterile technique utilized including caps,  mask, sterile gowns, sterile gloves, large sterile drape, hand hygiene, and sterile prep. Under sterile condition and local anesthesia, RIGHT greater saphenous vein access was performed with ultrasound. An ultrasound image was saved and sent to PACS. Over  a guidewire, the IVC filter delivery sheath and inner dilator were advanced into the IVC just above the IVC bifurcation. Contrast injection was performed for an IVC venogram. Through the delivery sheath, a retrievable Denali IVC filter was deployed below the level of the renal veins and above the IVC bifurcation. Limited post deployment venacavagram was performed. The delivery sheath was removed and hemostasis was obtained with manual compression. A dressing was placed. The patient tolerated the procedure well without immediate post procedural complication. FINDINGS: The IVC is patent. No evidence of thrombus, stenosis, or occlusion. No variant venous anatomy. IMPRESSION: Successful placement of a retrievable infrarenal inferior vena cava (IVC) filter PLAN: IVC filters can cause complications when left in place for extended periods of time. If medically appropriate, recommend discontinuing filter prior to discharge. Please re-evaluate the patient for filter discontinuation when they are seen in follow up, and refer patient to Interventional Radiology for removal. Thom Hall, MD [facility] Radiology Electronically Signed   By: Thom Hall M.D.   On: 06/07/2024 07:44   CT Angio Chest Pulmonary Embolism (PE) W or WO Contrast Result Date: 06/06/2024 CLINICAL DATA:  Acute pulmonary embolism, unspecified pulmonary embolism type, unspecified whether acute cor pulmonale present. Pulmonary embolism seen on CTA from 06/05/2024. EXAM: CT ANGIOGRAPHY CHEST WITH CONTRAST TECHNIQUE: Multidetector CT imaging of the chest was performed using the standard protocol during bolus administration of intravenous contrast. Multiplanar CT image reconstructions and MIPs were obtained to evaluate the vascular anatomy. RADIATION DOSE REDUCTION: This exam was performed according to the departmental dose-optimization program which includes automated exposure control, adjustment of the mA  and/or kV according to patient size and/or use of iterative reconstruction technique. CONTRAST:  75mL OMNIPAQUE  IOHEXOL  350 MG/ML SOLN COMPARISON:  CTA abdomen pelvis 06/05/2024 FINDINGS: Cardiovascular: Positive for bilateral pulmonary emboli. Moderate clot burden with predominantly nonocclusive clot bilaterally. There is a small amount of clot in the distal right pulmonary artery extending into the truncus anterior and right interlobar artery. Clot extending into right segmental pulmonary arteries. Clot in the left lobar and segmental pulmonary arteries. No evidence for right heart strain. Normal caliber of the thoracic aorta. Arch great vessels are patent. Heart size is normal. No significant pericardial effusion. Embolization coils involving the gastroduodenal artery. Mediastinum/Nodes: Again noted is a soft tissue mass in the right anterior cardiophrenic fat measuring up to 3.4 cm. No significant mediastinal or hilar lymph node enlargement. No axillary lymph enlargement. Lungs/Pleura: Trachea and mainstem bronchi are patent. No pleural effusions. Small parenchymal densities or airspace densities in the medial right upper lobe on image 51, sequence 7 are new since 2023. Findings could be associated with infarct versus small area of airspace disease. 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 These findings are poorly characterized on this chest CT. Right adrenal nodule. Musculoskeletal: No acute bone abnormality. Review of the MIP images confirms the above findings. IMPRESSION: 1. Positive for bilateral pulmonary emboli with moderate clot burden. Pulmonary emboli are predominantly nonocclusive and findings could be subacute. No evidence for right heart strain. 2. Parenchymal densities in the medial right lung could represent a small infarct versus small area of infection/inflammation. 3. Neoplastic disease in the upper abdomen. These results  were  called by telephone at the time of interpretation on 06/06/2024 at 2:04 pm to provider Dr. Hughes, Who verbally acknowledged these results. Electronically Signed   By: Juliene Balder M.D.   On: 06/06/2024 14:09   CT Angio Abd/Pel w/ and/or w/o Result Date: 06/06/2024 EXAM: CTA ABDOMEN AND PELVIS WITHOUT AND WITH CONTRAST 06/05/2024 02:49:17 PM TECHNIQUE: CTA images of the abdomen and pelvis without and with intravenous contrast. 125 mL (iopamidol  (ISOVUE -370) 76 % injection 125 mL IOPAMIDOL  (ISOVUE -370) INJECTION 76%) was administered. Three-dimensional MIP/volume rendered formations were performed. Automated exposure control, iterative reconstruction, and/or weight based adjustment of the mA/kV was utilized to reduce the radiation dose to as low as reasonably achievable. COMPARISON: Stable compared with the previous exam where applicable. New from prior exam where applicable. CLINICAL HISTORY: FINDINGS: VASCULATURE: AORTA: Aortic atherosclerotic calcification. No acute finding. No abdominal aortic aneurysm. No dissection. CELIAC TRUNK: No acute finding. No occlusion or significant stenosis. SUPERIOR MESENTERIC ARTERY: No acute finding. No occlusion or significant stenosis. RENAL ARTERIES: No acute finding. No occlusion or significant stenosis. ILIAC ARTERIES: No acute finding. No occlusion or significant stenosis. LIVER: Bilobar liver metastases. Index lesion within segment 7 measures 2.9 x 2.4 cm, axial image 24/7. Index lesion within segment 4a measures 8.7 x 6.6 cm, image 16/7. The index lesion within segment 2 measures 5.4 x 4.2 cm, axial image 16/7. GALLBLADDER AND BILE DUCTS: Circumferential gallbladder wall thickening of the gallbladder measures 4 mm. No biliary ductal dilatation. SPLEEN: The spleen is within normal limits in size and appearance. PANCREAS: The pancreas is normal in size and contour without focal lesion or ductal dilatation. ADRENAL GLANDS: There is a right adrenal nodule measuring 2.1 x 1.8  cm and 9 Hounsfield units, which is stable compared with the previous exam. KIDNEYS, URETERS AND BLADDER: Persistent stone within the urinary bladder near the expected location of the left UVJ measuring 7 mm in length, image 75/7. There is no hydronephrosis identified. No stones in the kidneys or ureters. No perinephric or periureteral stranding. 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 This measures 10.7 x 8.9 x 7.3 cm. On the previous exam this measured 7.2 x 7.5 x 6.9 cm. Mass effect on the patent extrahepatic portal vein is again noted. On today's exam the mass extends up and abuts the surface of the gallbladder with loss of a fat plane between these two structures. The mass also extends up to the serosal surface of the transverse colon with loss of a fat plane between these two structures as well. There is no pathologic dilatation of the bowel loops to indicate a mechanical obstruction. There is a moderate to large stool burden identified within the colon. No signs of active disease. No signs of intraluminal contrast extravasation to suggest active GI bleeding. REPRODUCTIVE: The uterus appears surgically absent. PERITONEUM AND RETRPERITONEUM: Signs of peritoneal metastases. Index a lesion within the right lower quadrant measures 3.1 cm (image 57/7). New from prior exam. Along the right parapelvic sidewall, there is a lesion measuring 1.2 cm, image 64/7. Midline periumbilical hernia contains fat only. No ascites or free air. LUNG BASE: Filling defect within a segmental branch of the right lower lobe pulmonary artery, compatible with incidental pulmonary embolus, axial image 2/5. LYMPH NODES: Abdominal adenopathy is identified. The index left retroperitoneal node measures 1.1 cm, image 39/7. On the previous exam, this measured 7 mm. BONES AND  SOFT TISSUES: Postoperative changes noted within the  lumbar spine. No acute or suspicious osseous findings. No acute soft tissue abnormality. IMPRESSION: 1. No evidence of active gastrointestinal bleeding on this CTA abdomen and pelvis. 2. Incidental pulmonary embolism identified within a segmental branch of the right lower lobe pulmonary artery. 3. 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 4. Bilobar hepatic metastatic disease.  New from prior. 5. Progressive abdominal nodal metastatic disease. 6. Peritoneal metastatic disease, new from prior. Electronically signed by: Waddell Calk MD 06/06/2024 07:36 AM EST RP Workstation: [id number]   DG Chest Portable 1 View Result Date: 06/04/2024 EXAM: 1 VIEW(S) XRAY OF THE CHEST 06/04/2024 04:19:00 PM COMPARISON: 05/09/2024 CLINICAL HISTORY: Fever. FINDINGS: LINES, TUBES AND DEVICES: Right chest power injectable port in place with tip at the lower SVC. LUNGS AND PLEURA: Panlike density favoring atelectasis along the left hemidiaphragm. No pleural effusion. No pneumothorax. HEART AND MEDIASTINUM: No acute abnormality of the cardiac and mediastinal silhouettes. BONES AND SOFT TISSUES: No acute osseous abnormality. JOINTS: Mild degenerative glenohumeral arthropathy bilaterally. IMPRESSION: 1. Mild left basilar atelectasis along the left hemidiaphragm. 2. Right chest power-injectable port with tip in the lower SVC. 3. Mild bilateral glenohumeral degenerative arthropathy. Electronically signed by: Ryan Salvage MD 06/04/2024 04:52 PM EST RP Workstation: [id number]   IR Radiologist Eval & Mgmt Result Date: 05/27/2024 EXAM: NEW PATIENT OFFICE VISIT CHIEF COMPLAINT: See below HISTORY OF PRESENT ILLNESS: See below REVIEW OF SYSTEMS: See below PHYSICAL EXAMINATION: See below ASSESSMENT AND PLAN: Please refer to completed note in the electronic medical record on [place] [facility] Thom Hall, MD  [facility] Radiology Electronically Signed   By: Thom Hall M.D.   On: 05/27/2024 09:02    Labs:  CBC: Recent Labs    06/06/24 0752 06/10/24 1237 06/11/24 0602 06/12/24 0532  WBC 7.2 8.6 7.6 10.0  HGB 8.6* 6.5* 8.2* 7.9*  HCT 26.5* 20.2* 26.0* 25.1*  PLT 188 255 259 278    COAGS: Recent Labs    06/06/24 0752  INR 1.0    BMP: Recent Labs    06/06/24 0752 06/10/24 1237 06/11/24 0443 06/12/24 0532  NA 136 135 131* 130*  K 3.5 3.0* 4.7 4.0  CL 101 99 99 96*  CO2 25 23 22 26   GLUCOSE 130* 126* 108* 109*  BUN 18 12 10 9   CALCIUM 8.2* 7.9* 7.6* 7.9*  CREATININE 0.82 0.58 0.67 0.64  GFRNONAA >60 >60 >60 >60    LIVER FUNCTION TESTS: Recent Labs    05/15/24 0854 05/20/24 1413 06/04/24 1414 06/10/24 1237  BILITOT <0.2 0.3 0.5 0.6  AST 23 21 43* 31  ALT 15 10 19 13   ALKPHOS 77 80 93 98  PROT 6.9 6.6 5.9* 5.7*  ALBUMIN  3.6 3.3* 2.8* 2.8*    TUMOR MARKERS: No results for input(s): AFPTM, CEA, CA199, CHROMGRNA in the last 8760 hours.  Assessment and Plan: Patient was initially diagnosed with bilateral ovarian masses on 04/2022. She is s/p chemotherapy (05/2022 - 10/2022) and total abdominal hysterectomy, bilateral salpingo-oophorectomy, appendectomy, rectosigmoid resection, total omentectomy, small bowel nodule resection. She was noted with metastatic recurrence by CT on 06/14/2023, and underwent chemotherapy (06/2023 - 11/2023). She is recommended for celiac plexus block by Palliative Medicine in an effort to optimally manage her severe abdominal pain.  Per Mr. Sherleen' progress note date 2/4: [...] Patient was hospitalized  in November and December 2025 with GI bleed. She had EGD with findings of a bleeding mass, invading the small bowel.  Patient is status post embolization and IVC filter placement.  Patient was admitted to hospital on 06/10/2024 with worsening abdominal pain.  Patient has required frequent  transfusions. She was referred to palliative care to discuss goals.  [...] Patient pending IR evaluation for consideration of celiac plexus block.   PLAN: - Continue current scope of treatment - Pending IR evaluation - Continue OxyContin /oxycodone  - As needed hydromorphone  - Daily bowel regimen - Outpatient follow-up.  Patient will tentatively present for celiac plexus block in IR tomorrow.  Patient will be appropriately NPO in anticipation of moderate sedation for patient comfort during procedure. All labs and medications are within acceptable parameters.  Allergies reviewed: Cymbalta; Cyclobenzaprine; Celexa; Fluticasone ; Gabapentin; Lyrica ; Opana; Tramadol (dizziness).  Risks and benefits of celiac plexus block were discussed with the patient including allergic reaction, decreased blood flow to the spinal cord/paralysis, gastroparesis, nerve damage, bleeding, infection, and damage to adjacent structures.  All of the patient's questions were answered, patient is agreeable to proceed. Consent signed and in chart.      Thank you for allowing our service to participate in [name] 's care.  Electronically Signed: Carlin DELENA Griffon, PA-C   06/12/2024, 11:52 PM      I spent a total of 40 Minutes in face to face in clinical consultation, greater than 50% of which was counseling/coordinating care for intractable abdominal pain, with consideration for celiac plexus block.   "

## 2024-06-13 ENCOUNTER — Inpatient Hospital Stay

## 2024-06-13 ENCOUNTER — Inpatient Hospital Stay: Admitting: Oncology

## 2024-06-13 LAB — BASIC METABOLIC PANEL WITH GFR
Anion gap: 9 (ref 5–15)
BUN: 10 mg/dL (ref 8–23)
CO2: 27 mmol/L (ref 22–32)
Calcium: 8.4 mg/dL — ABNORMAL LOW (ref 8.9–10.3)
Chloride: 98 mmol/L (ref 98–111)
Creatinine, Ser: 0.67 mg/dL (ref 0.44–1.00)
GFR, Estimated: >60 mL/min
Glucose, Bld: 109 mg/dL — ABNORMAL HIGH (ref 70–99)
Potassium: 4.3 mmol/L (ref 3.5–5.1)
Sodium: 134 mmol/L — ABNORMAL LOW (ref 135–145)

## 2024-06-13 LAB — CBC
HCT: 24.7 % — ABNORMAL LOW (ref 36.0–46.0)
Hemoglobin: 7.8 g/dL — ABNORMAL LOW (ref 12.0–15.0)
MCH: 28.2 pg (ref 26.0–34.0)
MCHC: 31.6 g/dL (ref 30.0–36.0)
MCV: 89.2 fL (ref 80.0–100.0)
Platelets: 341 10*3/uL (ref 150–400)
RBC: 2.77 MIL/uL — ABNORMAL LOW (ref 3.87–5.11)
RDW: 17.5 % — ABNORMAL HIGH (ref 11.5–15.5)
WBC: 9.8 10*3/uL (ref 4.0–10.5)
nRBC: 0 % (ref 0.0–0.2)

## 2024-06-13 MED ORDER — IOHEXOL 300 MG/ML  SOLN
10.0000 mL | Freq: Once | INTRAMUSCULAR | Status: AC | PRN
  Administered 2024-06-13: 10 mL via INTRATHECAL

## 2024-06-13 MED ORDER — HYDROMORPHONE HCL 1 MG/ML IJ SOLN
INTRAMUSCULAR | Status: AC
  Filled 2024-06-13: qty 1

## 2024-06-13 MED ORDER — FENTANYL CITRATE (PF) 100 MCG/2ML IJ SOLN
INTRAMUSCULAR | Status: AC | PRN
  Administered 2024-06-13 (×2): 50 ug via INTRAVENOUS
  Administered 2024-06-13: 25 ug via INTRAVENOUS
  Administered 2024-06-13: 50 ug via INTRAVENOUS
  Administered 2024-06-13: 25 ug via INTRAVENOUS

## 2024-06-13 MED ORDER — LIDOCAINE 1 % OPTIME INJ - NO CHARGE
30.0000 mL | Freq: Once | INTRAMUSCULAR | Status: AC
  Administered 2024-06-13: 30 mL

## 2024-06-13 MED ORDER — MIDAZOLAM HCL (PF) 2 MG/2ML IJ SOLN
INTRAMUSCULAR | Status: AC | PRN
  Administered 2024-06-13: 1 mg via INTRAVENOUS

## 2024-06-13 MED ORDER — MIDAZOLAM HCL 2 MG/2ML IJ SOLN
INTRAMUSCULAR | Status: AC
  Filled 2024-06-13: qty 2

## 2024-06-13 MED ORDER — MIDAZOLAM HCL 5 MG/5ML IJ SOLN
INTRAMUSCULAR | Status: AC | PRN
  Administered 2024-06-13 (×2): .5 mg via INTRAVENOUS
  Administered 2024-06-13 (×2): 1 mg via INTRAVENOUS

## 2024-06-13 MED ORDER — FENTANYL CITRATE (PF) 100 MCG/2ML IJ SOLN
INTRAMUSCULAR | Status: AC
  Filled 2024-06-13: qty 4

## 2024-06-13 NOTE — Progress Notes (Signed)
 Patient clinically stable post CT Celiac Plexus block with alcohol per Dr Johann, tolerated well, received Versed  4 mg along with Fentanyl  200 mcg IV for procedure. Having some post procedure, cramping intermittently, which can receive Dilaudid  per MD if needed. Report given to Grayce Ann Rn post procedure/specials/9

## 2024-06-13 NOTE — Care Management Important Message (Signed)
 Important Message  Patient Details  Name: [name] MRN: [id number] Date of Birth: Oct 21, 1955   Important Message Given:  Yes - Medicare IM     [name] 06/13/2024, 12:42 PM

## 2024-06-13 NOTE — TOC CM/SW Note (Signed)
 Transition of Care [facility]) - Inpatient Brief Assessment   Patient Details  Name: [name] MRN: [id number] Date of Birth: [birth year]  [facility] CM/SW Contact:    Nathanael CHRISTELLA Ring, RN Phone Number: 06/13/2024, 11:39 AM   Clinical Narrative:  High risk readmission risk assessment completed.    Transition of Care Asessment: Insurance and Status: Insurance coverage has been reviewed Patient has primary care physician: Yes Home environment has been reviewed: From home Prior level of function:: independent Prior/Current Home Services: No current home services Social Drivers of Health Review: SDOH reviewed no interventions necessary Readmission risk has been reviewed: Yes Transition of care needs: no transition of care needs at this time

## 2024-06-13 NOTE — Plan of Care (Signed)

## 2024-06-13 NOTE — Progress Notes (Signed)
 " Progress Note   Patient: [name] FMW:[id number] DOB: 05-Mar-1956 DOA: 06/10/2024  DOS: the patient was seen and examined on 06/13/2024   Brief hospital course:    69 y.o. female with medical history significant for metastatic ovarian cancer with duodenal metastasis and recurrent GI bleeding, cancer related pain, and bilateral pulmonary embolism diagnosed 06/06/2024, acute superficial venous thrombosis of cephalic vein of left upper arm, multiple sclerosis, rheumatoid arthritis on hydroxychloroquine , GERD, hypertension, depression, endometriosis, COPD, ADHD, anxiety.  She recently underwent 1.  Mesenteric arteriography and duodenal mass embolization 2. central venogram and IVC filter placement by IR on 06/06/2024.  She said that since her procedure, she has been having worsening abdominal pain despite taking the opioid analgesics at home.  She came to the hospital because of worsening abdominal pain.    Assessment and Plan:   Intractable abdominal pain secondary to metastatic ovarian cancer - Noted metastasis to duodenum, liver, colon.  S/p recent mesenteric arteriography and duodenal mass embolization 1/30 by IR.  Evaluated by GI, no intervention recommended.  Currently on long and short acting oxycodone , IV Dilaudid  as needed, although pain still not well-controlled.  Palliative care and oncology following closely.  Scheduled for celiac plexus block with IR later today.  Ongoing pain management likely to be adjusted by palliative/oncology.   Acute on chronic blood loss anemia - Hemoglobin 6.5 on admission.  S/p 1 unit PRBCs.  Hemoglobin this morning 7.8.  Etiology secondary to chronic bleeding from metastatic invasion of GI tract.  Per discussion with oncology yesterday, likely that patient will pursue ongoing outpatient iron /blood transfusions as a palliative measure.   Acute pulmonary embolism/acute left common femoral vein DVT/right great saphenous vein thrombosis - S/p IVC filter placement  1/30 by IR.  Anticoagulation contraindicated due to recurrent GI bleeding and ovarian cancer metastasis to duodenum.   Hypokalemia - Replenishment on board.  Appears resolved now.  Will recheck BMP and magnesium in AM.   COPD - Nebulizers as needed.   Multiple sclerosis - Does not appear to be in acute exacerbation.   Hypoalbuminemia/peripheral edema - Encourage p.o. intake.  Protein calorie supplementation.   Goals of care - Palliative care following closely.  CODE STATUS changed to DNR/DNI.  Ongoing measures appear to be focused on pain control and palliative transfusions.  Does not want to pursue chemotherapy or any other interventions.   Subjective: Patient resting comfortably this morning.  States her pain is about the same as yesterday.  Eager for procedure later today to hopefully help with her pain.  Denies any fever, shortness of breath, nausea, vomiting.  Physical Exam:  Vitals:   06/12/24 2118 06/12/24 2335 06/13/24 0400 06/13/24 0757  BP: 129/79 139/83 119/81 116/76  Pulse: 94 (!) 102 84 93  Resp: 20 20 20 16   Temp: 99 F (37.2 C) 98.1 F (36.7 C) 97.7 F (36.5 C) 99.5 F (37.5 C)  TempSrc: Oral Oral Oral   SpO2: 99% 96% 97% 98%  Weight:      Height:        GENERAL:  Alert, pleasant, no acute distress  HEENT:  EOMI CARDIOVASCULAR:  RRR, no murmurs appreciated RESPIRATORY:  Clear to auscultation, no wheezing, rales, or rhonchi GASTROINTESTINAL: Diffusely mildly tender EXTREMITIES:  No LE edema bilaterally NEURO:  No new focal deficits appreciated SKIN:  No rashes noted PSYCH:  Appropriate mood and affect, anxious   Data Reviewed:  Imaging Studies: CT Angio Abd/Pel W and/or Wo Contrast Result Date: 06/10/2024 CLINICAL DATA:  Metastatic ovarian cancer with worsening abdominal pain EXAM: CTA ABDOMEN AND PELVIS WITHOUT AND WITH CONTRAST TECHNIQUE: Multidetector CT imaging of the abdomen and pelvis was performed using the standard protocol during bolus  administration of intravenous contrast. Multiplanar reconstructed images and MIPs were obtained and reviewed to evaluate the vascular anatomy. RADIATION DOSE REDUCTION: This exam was performed according to the departmental dose-optimization program which includes automated exposure control, adjustment of the mA and/or kV according to patient size and/or use of iterative reconstruction technique. CONTRAST:  OMNIPAQUE  IOHEXOL  350 MG/ML SOLN COMPARISON:  CTA chest dated 06/06/2024, CTA abdomen and pelvis dated 06/05/2024 and multiple priors FINDINGS: VASCULAR Aorta: Normal caliber aorta without aneurysm, dissection, vasculitis or significant stenosis. Mild aortic atherosclerosis. Celiac: Patent without evidence of aneurysm, dissection, vasculitis or significant stenosis. Prior gastroduodenal artery embolization. SMA: Patent without evidence of aneurysm, dissection, vasculitis or significant stenosis. Renals: Single bilateral renal arteries. Both renal arteries are patent without evidence of aneurysm, dissection, vasculitis, fibromuscular dysplasia or significant stenosis. IMA: Patent without evidence of aneurysm, dissection, vasculitis or significant stenosis. Inflow: Mild narrowing of the bilateral internal iliac artery origins due to atherosclerotic plaque. Otherwise patent without evidence of aneurysm, dissection, vasculitis or significant stenosis. Proximal Outflow: Bilateral common femoral and visualized portions of the superficial and profunda femoral arteries are patent without evidence of aneurysm, dissection, vasculitis or significant stenosis. Veins: Interval placement of infrarenal IVC filter. Focal filling defect within the right great saphenous vein immediately distal to the saphenofemoral junction (16:197), which appears new compared to 06/05/2024. Filling defect within the left common femoral vein (16:210). Review of the MIP images confirms the above findings. NON-VASCULAR Lower chest: No focal  consolidation or pulmonary nodule in the lung bases. No pleural effusion or pneumothorax demonstrated. Partially imaged heart size is normal. Unchanged partially imaged bilateral lower lobe pulmonary emboli. Unchanged 3.4 x 2.5 cm right cardiophrenic nodule (16: 14). Hepatobiliary: Again seen are heterogeneously enhancing bilobar hepatic masses. Index lesions: -3.8 x 2.8 cm segment 7/8 (16:38) unchanged when remeasured -9.5 x 6.2 cm segment 4 (16:29), previously 8.7 x 6.6 cm -6.2 x 4.5 cm segment 2 (16:27), previously 5.4 x 4.2 cm No intra or extrahepatic biliary ductal dilation. Gallbladder is laterally displaced. Mild gallbladder mural thickening and pericholecystic free fluid. Pancreas: No focal lesions or main ductal dilation. Spleen: Normal in size without focal abnormality. Adrenals/Urinary Tract: Unchanged right adrenal nodule size measuring 2.9 x 2.1 cm (16:51) when remeasured. Nodule measures 23 HU precontrast. No left adrenal nodule. Duplex kidneys. No suspicious renal mass or hydronephrosis. Unchanged linear 7 mm calculus at the left ureterovesical junction (16:171). No focal bladder wall thickening. Stomach/Bowel: Normal appearance of the stomach. Again seen is heterogeneously enhancing mass within the right hemiabdomen centered at the proximal duodenum measuring 11.0 x 8.6 cm (16:78), previously 9.8 x 9.2 cm when measured similarly. The mass continues exerts local mass effect with lateral displacement of the gallbladder and continued effacement of the SMV portal confluence. Findings suspicious for interval invasion of the proximal transverse colon with marked luminal narrowing. Along the periphery of this mass, there are areas of irregular arterial hyperenhancement, for example posteroinferior (9:110), which expands on the venous phase image (16:107). Appendix is not discretely seen. Lymphatic: Slightly increased size of right pelvic sidewall nodule measuring 1.6 x 1.4 cm (16:152), previously 1.5 x 1.2  cm. Unchanged 1.0 cm left para-aortic lymph node (16:86). Increased size of right lower quadrant nodule measuring 3.5 x 2.6 cm (16:130), previously 3 1 x 2.5 cm (  remeasured). Reproductive: Status post hysterectomy and bilateral salpingo oophorectomy. Unchanged 2.0 x 1.8 cm nodular soft tissue focus, inseparable from the right vaginal cuff (16:163). Other: Trace right hemi abdominal free fluid. No fluid collection. Asymmetric thickening of the right anterior peritoneum adjacent to the duodenal mass. Musculoskeletal: No acute or abnormal lytic or blastic osseous lesions. Postsurgical changes of L4-S1 spinal fusion. Hardware appears intact. Subcutaneous soft tissue stranding and small focus of hyperdensity in the right gluteal region, likely postprocedural. Mild body wall edema. Small fat-containing paraumbilical hernia. IMPRESSION: 1. No acute aortic pathology. 2. Interval placement of infrarenal IVC filter. Filling defect within the right great saphenous vein immediately distal to the saphenofemoral femoral junction, which appears new compared to 06/05/2024, suspicious for thrombus. Filling defect within the left common femoral vein, consistent with known thrombus. 3. Unchanged partially imaged bilateral lower lobe pulmonary emboli. 4. Increased size of the heterogeneously enhancing mass within the right hemiabdomen centered at the proximal duodenum with findings suspicious for interval invasion of the proximal transverse colon resulting in marked luminal narrowing. Along the periphery of this mass, there are areas of irregular arterial hyperenhancement, which expands on the venous phase image, favored to represent areas of progressive tumoral enhancement, although areas of active extravasation could have a similar appearance. 5. Bilobar hepatic metastases, many of which have increased in size. Increased size of right lower quadrant nodule and right pelvic sidewall nodule. 6. Unchanged right cardiophrenic and adrenal  nodules and left para-aortic lymph node. 7. Unchanged linear 7 mm calculus at the left ureterovesical junction. No hydronephrosis. 8.  Aortic Atherosclerosis (ICD10-I70.0). Electronically Signed   By: Limin  Xu M.D.   On: 06/10/2024 15:48   DG Chest Portable 1 View Result Date: 06/10/2024 CLINICAL DATA:  Shortness of breath EXAM: PORTABLE CHEST 1 VIEW COMPARISON:  June 04, 2024 FINDINGS: The heart size and mediastinal contours are within normal limits. Right internal jugular Port-A-Cath is unchanged. Both lungs are clear. The visualized skeletal structures are unremarkable. IMPRESSION: No active disease. Electronically Signed   By: Lynwood Landy Raddle M.D.   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 EXAM: Title; COIL EMBOLIZATION OF GASTRODUODENAL ARTERY Listed procedures; 1.  ULTRASOUND-GUIDED RIGHT COMMON FEMORAL ARTERY ACCESS 2. MESENTERIC ARTERIOGRAPHY, including CELIAC, SUPERIOR MESENTERIC ARTERY and GASTRODUODENAL ARTERIOGRAMS 3. COIL EMBOLIZATION of the GASTRODUODENAL ARTERY 4. GELFOAM EMBOLIZATION of SUPERIOR MESENTERIC ARTERY BRANCHES COMPARISON:  CT AP, 06/05/2024. MEDICATIONS: 2 g cefoxitin  IV. The antibiotics were administered within 60 minutes of procedural initiation. 4 mg Zofran  IV.  50 mg albumin  IV ANESTHESIA/SEDATION: Moderate (conscious) sedation was employed during this procedure. A total of Versed  5 mg and Fentanyl  250 mcg was administered intravenously. Moderate Sedation Time: 90 minutes. The patient's level of consciousness and vital signs were monitored continuously by radiology nursing throughout the procedure under my direct supervision. CONTRAST:  140 mL Omnipaque  300 FLUOROSCOPY: Radiation Exposure Index and estimated peak skin dose (PSD); Reference air kerma (RAK), 429.3 mGy. COMPLICATIONS: None immediate.  PROCEDURE: Informed consent was obtained from the patient and/or patient's representative following explanation of the procedure, risks, benefits and alternatives. All questions were addressed. A time out was performed prior to the initiation of the procedure. Maximal barrier sterile technique utilized including caps, mask, sterile gowns, sterile gloves, large sterile drape, hand hygiene, and chlorhexidine  prep. The RIGHT femoral head was marked fluoroscopically. Under sterile  conditions and local anesthesia, the RIGHT common femoral artery access was performed with a micropuncture needle. Under direct ultrasound guidance, the RIGHT common femoral was accessed with a micropuncture kit. An ultrasound image was saved for documentation purposes. This allowed for placement of a 6 Fr 35 cm vascular sheath. A limited arteriogram was performed through the side arm of the sheath confirming appropriate access within the RIGHT common femoral artery. A limited abdominal aortogram was performed to help identify the celiac artery origin. Over a Bentson wire, a C2 catheter was advanced, back bled and flushed. The catheter was then utilized to select the celiac access, then advanced into the common hepatic then gastroduodenal arteries. Selective mesenteric arteriograms were performed at each level. Using a 2.8 Fr Progreat microcatheter and 0.016 inch Fathom microwire access into the gastroduodenal artery was performed and a selective arteriogram was performed. Selective embolization with multiple 0.018 inch micro coils was performed. The microcatheter was removed and arteriogram with the 5 Fr catheter at the common hepatic artery was performed. Adequate pruning of the GDA was achieved on post embolization arteriogram. The C2 catheter was replaced for a Sos catheter, which was then used to select the superior mesenteric artery. Additional selective arteriogram was performed and access into the branches of the SMA, including the  inferior pancreaticoduodenal and middle colic artery was obtained with the microcatheter. Gel-Foam embolization was then performed to near-stasis. Post embolization arteriogram demonstrated adequate pruning of the SMA branches supplying the tumor. Images were reviewed and the procedure was terminated. All wires, catheters and sheaths were removed from the patient. Hemostasis was achieved at the RIGHT groin access site with Angio-Seal closure. The patient tolerated the procedure well without immediate post procedural complication. FINDINGS: *Celiac and superior mesenteric arteriograms with normal order and branching. *Multivessel arterial supply to the large duodenal tumor, from the GDA and branches off the SMA, including the inferior pancreaticoduodenal and middle colic arteries. Tumor blush was identified, however no active extravasation was noted. *Coil embolization of the GDA, with additional Gelfoam embolization of the aforementioned SMA branches, with adequate pruning of the vasculature. IMPRESSION: 1. Mesenteric arteriography for duodenal mass, with arterial supply from the celiac axis at the GDA, and the SMA at the inferior pancreaticoduodenal and middle colic arteries. Tumor blush without evidence of active extravasation from the duodenal mass. 2. Successful coil embolization of the gastroduodenal artery, and Gelfoam embolization of the SMA branches without significant residual tumoral enhancement. Thom Hall, MD [facility] Electronically Signed   By: Thom Hall M.D.   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 EXAM: Title; COIL EMBOLIZATION OF GASTRODUODENAL ARTERY Listed procedures; 1.  ULTRASOUND-GUIDED RIGHT COMMON FEMORAL ARTERY ACCESS 2. MESENTERIC ARTERIOGRAPHY,  including CELIAC, SUPERIOR MESENTERIC ARTERY and GASTRODUODENAL ARTERIOGRAMS 3. COIL EMBOLIZATION of the GASTRODUODENAL ARTERY 4. GELFOAM EMBOLIZATION of SUPERIOR MESENTERIC ARTERY BRANCHES COMPARISON:  CT AP, 06/05/2024. MEDICATIONS: 2 g cefoxitin  IV. The antibiotics were administered within 60 minutes of procedural initiation. 4 mg Zofran  IV.  50 mg albumin  IV ANESTHESIA/SEDATION: Moderate (conscious) sedation was employed during this procedure. A total of Versed  5 mg and Fentanyl  250 mcg was administered intravenously. Moderate Sedation Time: 90 minutes. The patient's level of consciousness and vital signs were monitored continuously by radiology nursing throughout the procedure under my direct supervision. CONTRAST:  140 mL Omnipaque  300 FLUOROSCOPY: Radiation Exposure  Index and estimated peak skin dose (PSD); Reference air kerma (RAK), 429.3 mGy. COMPLICATIONS: None immediate. PROCEDURE: Informed consent was obtained from the patient and/or patient's representative following explanation of the procedure, risks, benefits and alternatives. All questions were addressed. A time out was performed prior to the initiation of the procedure. Maximal barrier sterile technique utilized including caps, mask, sterile gowns, sterile gloves, large sterile drape, hand hygiene, and chlorhexidine  prep. The RIGHT femoral head was marked fluoroscopically. Under sterile conditions and local anesthesia, the RIGHT common femoral artery access was performed with a micropuncture needle. Under direct ultrasound guidance, the RIGHT common femoral was accessed with a micropuncture kit. An ultrasound image was saved for documentation purposes. This allowed for placement of a 6 Fr 35 cm vascular sheath. A limited arteriogram was performed through the side arm of the sheath confirming appropriate access within the RIGHT common femoral artery. A limited abdominal aortogram was performed to help identify the celiac artery origin. Over a  Bentson wire, a C2 catheter was advanced, back bled and flushed. The catheter was then utilized to select the celiac access, then advanced into the common hepatic then gastroduodenal arteries. Selective mesenteric arteriograms were performed at each level. Using a 2.8 Fr Progreat microcatheter and 0.016 inch Fathom microwire access into the gastroduodenal artery was performed and a selective arteriogram was performed. Selective embolization with multiple 0.018 inch micro coils was performed. The microcatheter was removed and arteriogram with the 5 Fr catheter at the common hepatic artery was performed. Adequate pruning of the GDA was achieved on post embolization arteriogram. The C2 catheter was replaced for a Sos catheter, which was then used to select the superior mesenteric artery. Additional selective arteriogram was performed and access into the branches of the SMA, including the inferior pancreaticoduodenal and middle colic artery was obtained with the microcatheter. Gel-Foam embolization was then performed to near-stasis. Post embolization arteriogram demonstrated adequate pruning of the SMA branches supplying the tumor. Images were reviewed and the procedure was terminated. All wires, catheters and sheaths were removed from the patient. Hemostasis was achieved at the RIGHT groin access site with Angio-Seal closure. The patient tolerated the procedure well without immediate post procedural complication. FINDINGS: *Celiac and superior mesenteric arteriograms with normal order and branching. *Multivessel arterial supply to the large duodenal tumor, from the GDA and branches off the SMA, including the inferior pancreaticoduodenal and middle colic arteries. Tumor blush was identified, however no active extravasation was noted. *Coil embolization of the GDA, with additional Gelfoam embolization of the aforementioned SMA branches, with adequate pruning of the vasculature. IMPRESSION: 1. Mesenteric arteriography for  duodenal mass, with arterial supply from the celiac axis at the GDA, and the SMA at the inferior pancreaticoduodenal and middle colic arteries. Tumor blush without evidence of active extravasation from the duodenal mass. 2. Successful coil embolization of the gastroduodenal artery, and Gelfoam embolization of the SMA branches without significant residual tumoral enhancement. Thom Hall, MD [facility] Rad[facility] Electronically Signed   By: Thom Hall M.D.   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 EXAM: Title; COIL EMBOLIZATION OF GASTRODUODENAL ARTERY Listed procedures; 1.  ULTRASOUND-GUIDED RIGHT COMMON FEMORAL ARTERY ACCESS 2. MESENTERIC ARTERIOGRAPHY, including CELIAC, SUPERIOR MESENTERIC ARTERY and GASTRODUODENAL ARTERIOGRAMS 3. COIL EMBOLIZATION of the GASTRODUODENAL ARTERY 4. GELFOAM EMBOLIZATION of SUPERIOR MESENTERIC ARTERY BRANCHES COMPARISON:  CT  AP, 06/05/2024. MEDICATIONS: 2 g cefoxitin  IV. The antibiotics were administered within 60 minutes of procedural initiation. 4 mg Zofran  IV.  50 mg albumin  IV ANESTHESIA/SEDATION: Moderate (conscious) sedation was employed during this procedure. A total of Versed  5 mg and Fentanyl  250 mcg was administered intravenously. Moderate Sedation Time: 90 minutes. The patient's level of consciousness and vital signs were monitored continuously by radiology nursing throughout the procedure under my direct supervision. CONTRAST:  140 mL Omnipaque  300 FLUOROSCOPY: Radiation Exposure Index and estimated peak skin dose (PSD); Reference air kerma (RAK), 429.3 mGy. COMPLICATIONS: None immediate. PROCEDURE: Informed consent was obtained from the patient and/or patient's representative following explanation of the procedure, risks,  benefits and alternatives. All questions were addressed. A time out was performed prior to the initiation of the procedure. Maximal barrier sterile technique utilized including caps, mask, sterile gowns, sterile gloves, large sterile drape, hand hygiene, and chlorhexidine  prep. The RIGHT femoral head was marked fluoroscopically. Under sterile conditions and local anesthesia, the RIGHT common femoral artery access was performed with a micropuncture needle. Under direct ultrasound guidance, the RIGHT common femoral was accessed with a micropuncture kit. An ultrasound image was saved for documentation purposes. This allowed for placement of a 6 Fr 35 cm vascular sheath. A limited arteriogram was performed through the side arm of the sheath confirming appropriate access within the RIGHT common femoral artery. A limited abdominal aortogram was performed to help identify the celiac artery origin. Over a Bentson wire, a C2 catheter was advanced, back bled and flushed. The catheter was then utilized to select the celiac access, then advanced into the common hepatic then gastroduodenal arteries. Selective mesenteric arteriograms were performed at each level. Using a 2.8 Fr Progreat microcatheter and 0.016 inch Fathom microwire access into the gastroduodenal artery was performed and a selective arteriogram was performed. Selective embolization with multiple 0.018 inch micro coils was performed. The microcatheter was removed and arteriogram with the 5 Fr catheter at the common hepatic artery was performed. Adequate pruning of the GDA was achieved on post embolization arteriogram. The C2 catheter was replaced for a Sos catheter, which was then used to select the superior mesenteric artery. Additional selective arteriogram was performed and access into the branches of the SMA, including the inferior pancreaticoduodenal and middle colic artery was obtained with the microcatheter. Gel-Foam embolization was then performed to  near-stasis. Post embolization arteriogram demonstrated adequate pruning of the SMA branches supplying the tumor. Images were reviewed and the procedure was terminated. All wires, catheters and sheaths were removed from the patient. Hemostasis was achieved at the RIGHT groin access site with Angio-Seal closure. The patient tolerated the procedure well without immediate post procedural complication. FINDINGS: *Celiac and superior mesenteric arteriograms with normal order and branching. *Multivessel arterial supply to the large duodenal tumor, from the GDA and branches off the SMA, including the inferior pancreaticoduodenal and middle colic arteries. Tumor blush was identified, however no active extravasation was noted. *Coil embolization of the GDA, with additional Gelfoam embolization of the aforementioned SMA branches, with adequate pruning of the vasculature. IMPRESSION: 1. Mesenteric arteriography for duodenal mass, with arterial supply from the celiac axis at the GDA, and the SMA at the inferior pancreaticoduodenal and middle colic arteries. Tumor blush without evidence of active extravasation from the duodenal mass. 2. Successful coil embolization of the gastroduodenal artery, and Gelfoam embolization of the SMA branches without significant residual tumoral enhancement. Thom Hall, MD [facility] Radiology Electronically Signed   By: Thom  Mugweru M.D.   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 EXAM: Title; COIL EMBOLIZATION OF GASTRODUODENAL ARTERY Listed procedures; 1.  ULTRASOUND-GUIDED RIGHT COMMON FEMORAL ARTERY ACCESS 2. MESENTERIC ARTERIOGRAPHY, including CELIAC, SUPERIOR MESENTERIC ARTERY and GASTRODUODENAL ARTERIOGRAMS 3. COIL EMBOLIZATION of the GASTRODUODENAL  ARTERY 4. GELFOAM EMBOLIZATION of SUPERIOR MESENTERIC ARTERY BRANCHES COMPARISON:  CT AP, 06/05/2024. MEDICATIONS: 2 g cefoxitin  IV. The antibiotics were administered within 60 minutes of procedural initiation. 4 mg Zofran  IV.  50 mg albumin  IV ANESTHESIA/SEDATION: Moderate (conscious) sedation was employed during this procedure. A total of Versed  5 mg and Fentanyl  250 mcg was administered intravenously. Moderate Sedation Time: 90 minutes. The patient's level of consciousness and vital signs were monitored continuously by radiology nursing throughout the procedure under my direct supervision. CONTRAST:  140 mL Omnipaque  300 FLUOROSCOPY: Radiation Exposure Index and estimated peak skin dose (PSD); Reference air kerma (RAK), 429.3 mGy. COMPLICATIONS: None immediate. PROCEDURE: Informed consent was obtained from the patient and/or patient's representative following explanation of the procedure, risks, benefits and alternatives. All questions were addressed. A time out was performed prior to the initiation of the procedure. Maximal barrier sterile technique utilized including caps, mask, sterile gowns, sterile gloves, large sterile drape, hand hygiene, and chlorhexidine  prep. The RIGHT femoral head was marked fluoroscopically. Under sterile conditions and local anesthesia, the RIGHT common femoral artery access was performed with a micropuncture needle. Under direct ultrasound guidance, the RIGHT common femoral was accessed with a micropuncture kit. An ultrasound image was saved for documentation purposes. This allowed for placement of a 6 Fr 35 cm vascular sheath. A limited arteriogram was performed through the side arm of the sheath confirming appropriate access within the RIGHT common femoral artery. A limited abdominal aortogram was performed to help identify the celiac artery origin. Over a Bentson wire, a C2 catheter was advanced, back bled and flushed. The catheter was then utilized to select the celiac access,  then advanced into the common hepatic then gastroduodenal arteries. Selective mesenteric arteriograms were performed at each level. Using a 2.8 Fr Progreat microcatheter and 0.016 inch Fathom microwire access into the gastroduodenal artery was performed and a selective arteriogram was performed. Selective embolization with multiple 0.018 inch micro coils was performed. The microcatheter was removed and arteriogram with the 5 Fr catheter at the common hepatic artery was performed. Adequate pruning of the GDA was achieved on post embolization arteriogram. The C2 catheter was replaced for a Sos catheter, which was then used to select the superior mesenteric artery. Additional selective arteriogram was performed and access into the branches of the SMA, including the inferior pancreaticoduodenal and middle colic artery was obtained with the microcatheter. Gel-Foam embolization was then performed to near-stasis. Post embolization arteriogram demonstrated adequate pruning of the SMA branches supplying the tumor. Images were reviewed and the procedure was terminated. All wires, catheters and sheaths were removed from the patient. Hemostasis was achieved at the RIGHT groin access site with Angio-Seal closure. The patient tolerated the procedure well without immediate post procedural complication. FINDINGS: *Celiac and superior mesenteric arteriograms with normal order and branching. *Multivessel arterial supply to the large duodenal tumor, from the GDA and branches off the SMA, including the inferior pancreaticoduodenal and middle colic arteries. Tumor blush was identified, however no active extravasation was noted. *Coil embolization of the GDA, with additional Gelfoam embolization of the aforementioned SMA  branches, with adequate pruning of the vasculature. IMPRESSION: 1. Mesenteric arteriography for duodenal mass, with arterial supply from the celiac axis at the GDA, and the SMA at the inferior pancreaticoduodenal and  middle colic arteries. Tumor blush without evidence of active extravasation from the duodenal mass. 2. Successful coil embolization of the gastroduodenal artery, and Gelfoam embolization of the SMA branches without significant residual tumoral enhancement. Thom Hall, MD [facility] Radiology Electronically Signed   By: Thom Hall M.D.   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 EXAM: Title; COIL EMBOLIZATION OF GASTRODUODENAL ARTERY Listed procedures; 1.  ULTRASOUND-GUIDED RIGHT COMMON FEMORAL ARTERY ACCESS 2. MESENTERIC ARTERIOGRAPHY, including CELIAC, SUPERIOR MESENTERIC ARTERY and GASTRODUODENAL ARTERIOGRAMS 3. COIL EMBOLIZATION of the GASTRODUODENAL ARTERY 4. GELFOAM EMBOLIZATION of SUPERIOR MESENTERIC ARTERY BRANCHES COMPARISON:  CT AP, 06/05/2024. MEDICATIONS: 2 g cefoxitin  IV. The antibiotics were administered within 60 minutes of procedural initiation. 4 mg Zofran  IV.  50 mg albumin  IV ANESTHESIA/SEDATION: Moderate (conscious) sedation was employed during this procedure. A total of Versed  5 mg and Fentanyl  250 mcg was administered intravenously. Moderate Sedation Time: 90 minutes. The patient's level of consciousness and vital signs were monitored continuously by radiology nursing throughout the procedure under my direct supervision. CONTRAST:  140 mL Omnipaque  300 FLUOROSCOPY: Radiation Exposure Index and estimated peak skin dose (PSD); Reference air kerma (RAK), 429.3 mGy. COMPLICATIONS: None immediate. PROCEDURE: Informed consent was obtained from the patient and/or patient's representative following explanation of the procedure, risks, benefits and alternatives. All questions were addressed. A time out was performed prior to the initiation of the  procedure. Maximal barrier sterile technique utilized including caps, mask, sterile gowns, sterile gloves, large sterile drape, hand hygiene, and chlorhexidine  prep. The RIGHT femoral head was marked fluoroscopically. Under sterile conditions and local anesthesia, the RIGHT common femoral artery access was performed with a micropuncture needle. Under direct ultrasound guidance, the RIGHT common femoral was accessed with a micropuncture kit. An ultrasound image was saved for documentation purposes. This allowed for placement of a 6 Fr 35 cm vascular sheath. A limited arteriogram was performed through the side arm of the sheath confirming appropriate access within the RIGHT common femoral artery. A limited abdominal aortogram was performed to help identify the celiac artery origin. Over a Bentson wire, a C2 catheter was advanced, back bled and flushed. The catheter was then utilized to select the celiac access, then advanced into the common hepatic then gastroduodenal arteries. Selective mesenteric arteriograms were performed at each level. Using a 2.8 Fr Progreat microcatheter and 0.016 inch Fathom microwire access into the gastroduodenal artery was performed and a selective arteriogram was performed. Selective embolization with multiple 0.018 inch micro coils was performed. The microcatheter was removed and arteriogram with the 5 Fr catheter at the common hepatic artery was performed. Adequate pruning of the GDA was achieved on post embolization arteriogram. The C2 catheter was replaced for a Sos catheter, which was then used to select the superior mesenteric artery. Additional selective arteriogram was performed and access into the branches of the SMA, including the inferior pancreaticoduodenal and middle colic artery was obtained with the microcatheter. Gel-Foam embolization was then performed to near-stasis. Post embolization arteriogram demonstrated adequate pruning of the SMA branches supplying the tumor.  Images were reviewed and the procedure was terminated. All wires, catheters and  sheaths were removed from the patient. Hemostasis was achieved at the RIGHT groin access site with Angio-Seal closure. The patient tolerated the procedure well without immediate post procedural complication. FINDINGS: *Celiac and superior mesenteric arteriograms with normal order and branching. *Multivessel arterial supply to the large duodenal tumor, from the GDA and branches off the SMA, including the inferior pancreaticoduodenal and middle colic arteries. Tumor blush was identified, however no active extravasation was noted. *Coil embolization of the GDA, with additional Gelfoam embolization of the aforementioned SMA branches, with adequate pruning of the vasculature. IMPRESSION: 1. Mesenteric arteriography for duodenal mass, with arterial supply from the celiac axis at the GDA, and the SMA at the inferior pancreaticoduodenal and middle colic arteries. Tumor blush without evidence of active extravasation from the duodenal mass. 2. Successful coil embolization of the gastroduodenal artery, and Gelfoam embolization of the SMA branches without significant residual tumoral enhancement. Thom Hall, MD [facility] Radiology Electronically Signed   By: Thom Hall M.D.   On: 06/09/2024 14:52   VAS US  LOWER EXTREMITY VENOUS (DVT) Result Date: 06/09/2024  Lower Venous DVT Study Patient Name:  MARYKATHRYN CARBONI  Date of Exam:   06/06/2024 Medical Rec #: [id number]        Accession #:    [id number] Date of Birth: 1956/03/29       Patient Gender: F Patient Age:   37 years Exam Location:  [facility] Procedure:      VAS US  LOWER EXTREMITY VENOUS (DVT) Referring Phys: THOM HALL --------------------------------------------------------------------------------  Indications: Incidental pulmonary embolism on abdominal CTA 06/05/24 Other Indications: Status post mesenteric arteriography and  duodenal mass                    embolization with IVC filter placement 06/06/24. 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 Limitations: Bandage right groin. Comparison Study: Prior negative left LEV done 05/29/22 Performing Technologist: Alberta Lis RVS  Examination Guidelines: A complete evaluation includes B-mode imaging, spectral Doppler, color Doppler, and power Doppler as needed of all accessible portions of each vessel. Bilateral testing is considered an integral part of a complete examination. Limited examinations for reoccurring indications may be performed as noted. The reflux portion of the exam is performed with the patient in reverse Trendelenburg.  +---------+---------------+---------+-----------+----------+--------------+ RIGHT    CompressibilityPhasicitySpontaneityPropertiesThrombus Aging +---------+---------------+---------+-----------+----------+--------------+ CFV      Full           Yes      No                                  +---------+---------------+---------+-----------+----------+--------------+ SFJ      Full                                                        +---------+---------------+---------+-----------+----------+--------------+ FV Prox  Full           Yes      No                                  +---------+---------------+---------+-----------+----------+--------------+ FV Mid   Full                                                        +---------+---------------+---------+-----------+----------+--------------+  FV DistalFull                                                        +---------+---------------+---------+-----------+----------+--------------+ PFV      Full           Yes      No                                  +---------+---------------+---------+-----------+----------+--------------+ POP      Full           Yes      No                                   +---------+---------------+---------+-----------+----------+--------------+ PTV      Full                                                        +---------+---------------+---------+-----------+----------+--------------+ PERO     Full                                                        +---------+---------------+---------+-----------+----------+--------------+   +---------+---------------+---------+-----------+----------+--------------+ LEFT     CompressibilityPhasicitySpontaneityPropertiesThrombus Aging +---------+---------------+---------+-----------+----------+--------------+ CFV      Full           Yes      No                                  +---------+---------------+---------+-----------+----------+--------------+ SFJ      Full                                                        +---------+---------------+---------+-----------+----------+--------------+ FV Prox  None           No       No                   Acute          +---------+---------------+---------+-----------+----------+--------------+ FV Mid   None           No       No                   Acute          +---------+---------------+---------+-----------+----------+--------------+ FV DistalNone           No       No                   Acute          +---------+---------------+---------+-----------+----------+--------------+ PFV      Full  Yes      No                                  +---------+---------------+---------+-----------+----------+--------------+ POP      None           No       No                   Acute          +---------+---------------+---------+-----------+----------+--------------+ PTV      None                                         Acute          +---------+---------------+---------+-----------+----------+--------------+ PERO     None                                         Acute           +---------+---------------+---------+-----------+----------+--------------+     Summary: RIGHT: - There is no evidence of deep vein thrombosis in the lower extremity.  - No cystic structure found in the popliteal fossa.  LEFT: - Findings consistent with acute deep vein thrombosis involving the left femoral vein, left popliteal vein, left posterior tibial veins, and left peroneal veins.  - No cystic structure found in the popliteal fossa.  *See table(s) above for measurements and observations. Electronically signed by Gaile New MD on 06/09/2024 at 10:45:02 AM.    Final    IR IVC FILTER PLMT / S&I PORTER GUID/MOD SED Result Date: 06/07/2024 INDICATION: duodenal mass Briefly, 68 year old female with a history of metastatic ovarian cancer, bleeding duodenal metastasis, and new, incidental PE and LEFT lower extremity DVT. Unable to anticoagulate. EXAM: Title; INFERIOR VENA CAVA (IVC) FILTER PLACEMENT Listed procedures; 1. ULTRASOUND-GUIDED RIGHT GREATER SAPHENOUS VEIN ACCESS 2. CENTRAL VENOGRAM 3. INFERIOR VENA CAVA FILTER PLACEMENT MEDICATIONS: None. ANESTHESIA/SEDATION: Moderate (conscious) sedation was employed during this procedure. A total of Versed  1 mg and Fentanyl  50 mcg was administered intravenously. Moderate Sedation Time: 18 minutes. The patient's level of consciousness and vital signs were monitored continuously by radiology nursing throughout the procedure under my direct supervision. CONTRAST:  60 mL Omnipaque  300 FLUOROSCOPY: Radiation Exposure Index and estimated peak skin dose (PSD); Reference air kerma (RAK), 111.9 mGy. COMPLICATIONS: None immediate. PROCEDURE: Informed written consent was obtained from the patient and/or patient's representative following explanation of the procedure, risks, benefits and alternatives. A time out was performed prior to the initiation of the procedure. Maximal barrier sterile technique utilized including caps, mask, sterile gowns, sterile gloves, large sterile drape,  hand hygiene, and sterile prep. Under sterile condition and local anesthesia, RIGHT greater saphenous vein access was performed with ultrasound. An ultrasound image was saved and sent to PACS. Over a guidewire, the IVC filter delivery sheath and inner dilator were advanced into the IVC just above the IVC bifurcation. Contrast injection was performed for an IVC venogram. Through the delivery sheath, a retrievable Denali IVC filter was deployed below the level of the renal veins and above the IVC bifurcation. Limited post deployment venacavagram was performed. The delivery sheath was removed and hemostasis was obtained with manual compression. A dressing was placed. The patient tolerated the procedure  well without immediate post procedural complication. FINDINGS: The IVC is patent. No evidence of thrombus, stenosis, or occlusion. No variant venous anatomy. IMPRESSION: Successful placement of a retrievable infrarenal inferior vena cava (IVC) filter PLAN: IVC filters can cause complications when left in place for extended periods of time. If medically appropriate, recommend discontinuing filter prior to discharge. Please re-evaluate the patient for filter discontinuation when they are seen in follow up, and refer patient to Interventional Radiology for removal. Thom Hall, MD [facility] Radiology Electronically Signed   By: Thom Hall M.D.   On: 06/07/2024 07:44   CT Angio Chest Pulmonary Embolism (PE) W or WO Contrast Result Date: 06/06/2024 CLINICAL DATA:  Acute pulmonary embolism, unspecified pulmonary embolism type, unspecified whether acute cor pulmonale present. Pulmonary embolism seen on CTA from 06/05/2024. EXAM: CT ANGIOGRAPHY CHEST WITH CONTRAST TECHNIQUE: Multidetector CT imaging of the chest was performed using the standard protocol during bolus administration of intravenous contrast. Multiplanar CT image reconstructions and MIPs were obtained to  evaluate the vascular anatomy. RADIATION DOSE REDUCTION: This exam was performed according to the departmental dose-optimization program which includes automated exposure control, adjustment of the mA and/or kV according to patient size and/or use of iterative reconstruction technique. CONTRAST:  75mL OMNIPAQUE  IOHEXOL  350 MG/ML SOLN COMPARISON:  CTA abdomen pelvis 06/05/2024 FINDINGS: Cardiovascular: Positive for bilateral pulmonary emboli. Moderate clot burden with predominantly nonocclusive clot bilaterally. There is a small amount of clot in the distal right pulmonary artery extending into the truncus anterior and right interlobar artery. Clot extending into right segmental pulmonary arteries. Clot in the left lobar and segmental pulmonary arteries. No evidence for right heart strain. Normal caliber of the thoracic aorta. Arch great vessels are patent. Heart size is normal. No significant pericardial effusion. Embolization coils involving the gastroduodenal artery. Mediastinum/Nodes: Again noted is a soft tissue mass in the right anterior cardiophrenic fat measuring up to 3.4 cm. No significant mediastinal or hilar lymph node enlargement. No axillary lymph enlargement. Lungs/Pleura: Trachea and mainstem bronchi are patent. No pleural effusions. Small parenchymal densities or airspace densities in the medial right upper lobe on image 51, sequence 7 are new since 2023. Findings could be associated with infarct versus small area of airspace disease. 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 These findings are poorly characterized on this chest CT. Right adrenal nodule. Musculoskeletal: No acute bone abnormality. Review of the MIP images confirms the above findings. IMPRESSION: 1. Positive for bilateral pulmonary emboli with moderate clot burden. Pulmonary emboli are predominantly nonocclusive and findings could be subacute. No evidence for right heart  strain. 2. Parenchymal densities in the medial right lung could represent a small infarct versus small area of infection/inflammation. 3. Neoplastic disease in the upper abdomen. These results were called by telephone at the time of interpretation on 06/06/2024 at 2:04 pm to provider Dr. Hall, Who verbally acknowledged these results. Electronically Signed   By: Juliene Balder M.D.   On: 06/06/2024 14:09   CT Angio Abd/Pel w/ and/or w/o Result Date: 06/06/2024 EXAM: CTA ABDOMEN AND PELVIS WITHOUT AND WITH CONTRAST 06/05/2024 02:49:17 PM TECHNIQUE: CTA images of the abdomen and pelvis without and with intravenous contrast. 125 mL (iopamidol  (ISOVUE -370) 76 % injection 125 mL IOPAMIDOL  (ISOVUE -370) INJECTION 76%) was administered. Three-dimensional MIP/volume rendered formations were performed. Automated exposure control, iterative reconstruction, and/or weight based adjustment of the mA/kV was utilized to reduce the radiation dose to as low  as reasonably achievable. COMPARISON: Stable compared with the previous exam where applicable. New from prior exam where applicable. CLINICAL HISTORY: FINDINGS: VASCULATURE: AORTA: Aortic atherosclerotic calcification. No acute finding. No abdominal aortic aneurysm. No dissection. CELIAC TRUNK: No acute finding. No occlusion or significant stenosis. SUPERIOR MESENTERIC ARTERY: No acute finding. No occlusion or significant stenosis. RENAL ARTERIES: No acute finding. No occlusion or significant stenosis. ILIAC ARTERIES: No acute finding. No occlusion or significant stenosis. LIVER: Bilobar liver metastases. Index lesion within segment 7 measures 2.9 x 2.4 cm, axial image 24/7. Index lesion within segment 4a measures 8.7 x 6.6 cm, image 16/7. The index lesion within segment 2 measures 5.4 x 4.2 cm, axial image 16/7. GALLBLADDER AND BILE DUCTS: Circumferential gallbladder wall thickening of the gallbladder measures 4 mm. No biliary ductal dilatation. SPLEEN: The spleen is within  normal limits in size and appearance. PANCREAS: The pancreas is normal in size and contour without focal lesion or ductal dilatation. ADRENAL GLANDS: There is a right adrenal nodule measuring 2.1 x 1.8 cm and 9 Hounsfield units, which is stable compared with the previous exam. KIDNEYS, URETERS AND BLADDER: Persistent stone within the urinary bladder near the expected location of the left UVJ measuring 7 mm in length, image 75/7. There is no hydronephrosis identified. No stones in the kidneys or ureters. No perinephric or periureteral stranding. 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 This measures 10.7 x 8.9 x 7.3 cm. On the previous exam this measured 7.2 x 7.5 x 6.9 cm. Mass effect on the patent extrahepatic portal vein is again noted. On today's exam the mass extends up and abuts the surface of the gallbladder with loss of a fat plane between these two structures. The mass also extends up to the serosal surface of the transverse colon with loss of a fat plane between these two structures as well. There is no pathologic dilatation of the bowel loops to indicate a mechanical obstruction. There is a moderate to large stool burden identified within the colon. No signs of active disease. No signs of intraluminal contrast extravasation to suggest active GI bleeding. REPRODUCTIVE: The uterus appears surgically absent. PERITONEUM AND RETRPERITONEUM: Signs of peritoneal metastases. Index a lesion within the right lower quadrant measures 3.1 cm (image 57/7). New from prior exam. Along the right parapelvic sidewall, there is a lesion measuring 1.2 cm, image 64/7. Midline periumbilical hernia contains fat only. No ascites or free air. LUNG BASE: Filling defect within a segmental branch of the right lower lobe pulmonary artery, compatible with incidental pulmonary embolus, axial image 2/5. LYMPH NODES: Abdominal  adenopathy is identified. The index left retroperitoneal node measures 1.1 cm, image 39/7. On the previous exam, this measured 7 mm. BONES AND SOFT TISSUES: Postoperative changes noted within the lumbar spine. No acute or suspicious osseous findings. No acute soft tissue abnormality. IMPRESSION: 1. No evidence of active gastrointestinal bleeding on this CTA abdomen and pelvis. 2. Incidental pulmonary embolism identified within a segmental branch of the right lower lobe pulmonary artery. 3. 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 4. Bilobar hepatic metastatic disease.  New from prior. 5. Progressive abdominal nodal metastatic disease. 6. Peritoneal metastatic disease, new from prior. Electronically signed by: Waddell Calk MD 06/06/2024 07:36 AM EST RP Workstation: [id number]   DG  Chest Portable 1 View Result Date: 06/04/2024 EXAM: 1 VIEW(S) XRAY OF THE CHEST 06/04/2024 04:19:00 PM COMPARISON: 05/09/2024 CLINICAL HISTORY: Fever. FINDINGS: LINES, TUBES AND DEVICES: Right chest power injectable port in place with tip at the lower SVC. LUNGS AND PLEURA: Panlike density favoring atelectasis along the left hemidiaphragm. No pleural effusion. No pneumothorax. HEART AND MEDIASTINUM: No acute abnormality of the cardiac and mediastinal silhouettes. BONES AND SOFT TISSUES: No acute osseous abnormality. JOINTS: Mild degenerative glenohumeral arthropathy bilaterally. IMPRESSION: 1. Mild left basilar atelectasis along the left hemidiaphragm. 2. Right chest power-injectable port with tip in the lower SVC. 3. Mild bilateral glenohumeral degenerative arthropathy. Electronically signed by: Ryan Salvage MD 06/04/2024 04:52 PM EST RP Workstation: [id number]   IR Radiologist Eval & Mgmt Result Date: 05/27/2024 EXAM: NEW PATIENT OFFICE VISIT CHIEF COMPLAINT: See below HISTORY OF PRESENT ILLNESS:  See below REVIEW OF SYSTEMS: See below PHYSICAL EXAMINATION: See below ASSESSMENT AND PLAN: Please refer to completed note in the electronic medical record on [place] [facility] Thom Hall, MD [facility] Electronically Signed   By: Thom Hall M.D.   On: 05/27/2024 09:02    There are no new results to review at this time.  Previous records (including but not limited to H&P, progress notes, nursing notes, TOC management) were reviewed in assessment of this patient.  Labs: CBC: Recent Labs  Lab 06/10/24 1237 06/11/24 0602 06/12/24 0532 06/13/24 0512  WBC 8.6 7.6 10.0 9.8  HGB 6.5* 8.2* 7.9* 7.8*  HCT 20.2* 26.0* 25.1* 24.7*  MCV 88.2 89.3 88.1 89.2  PLT 255 259 278 341   Basic Metabolic Panel: Recent Labs  Lab 06/10/24 1237 06/10/24 1340 06/11/24 0443 06/12/24 0532 06/13/24 0512  NA 135  --  131* 130* 134*  K 3.0*  --  4.7 4.0 4.3  CL 99  --  99 96* 98  CO2 23  --  22 26 27   GLUCOSE 126*  --  108* 109* 109*  BUN 12  --  10 9 10   CREATININE 0.58  --  0.67 0.64 0.67  CALCIUM 7.9*  --  7.6* 7.9* 8.4*  MG  --  1.7  --  1.7  --   PHOS  --  3.6  --   --   --    Liver Function Tests: Recent Labs  Lab 06/10/24 1237  AST 31  ALT 13  ALKPHOS 98  BILITOT 0.6  PROT 5.7*  ALBUMIN  2.8*   CBG: No results for input(s): GLUCAP in the last 168 hours.  Scheduled Meds:  Chlorhexidine  Gluconate Cloth  6 each Topical Daily   dexamethasone   4 mg Oral Daily   feeding supplement  237 mL Oral BID BM   hydroxychloroquine   200 mg Oral BID   mirtazapine   7.5 mg Oral QHS   oxyCODONE   15 mg Oral Q12H   pantoprazole   40 mg Oral Daily   sodium chloride  flush  10-40 mL Intracatheter Q12H   [START ON 06/15/2024] Vitamin D  (Ergocalciferol )  50,000 Units Oral Q Sun   Continuous Infusions: PRN Meds:.acetaminophen  **OR** acetaminophen , albuterol , HYDROmorphone  (DILAUDID ) injection, LORazepam , naloxone , oxyCODONE , polyethylene  glycol, prochlorperazine , sodium chloride  flush  Family Communication: None at bedside  Disposition: Status is: Inpatient Remains inpatient appropriate because: Intractable abdominal pain     Time spent: 34 minutes  Length of inpatient stay: 2 days  Author: Carliss LELON Canales, DO 06/13/2024 10:27 AM  For on call review [email or web address].   "

## 2024-06-13 NOTE — Procedures (Signed)
" °  Procedure:  CT guided celiac plexus block and neurolysis bilat, 30ml EtOH Preprocedure diagnosis: Visceral abdominal pain, duodenal mass, liver metastases Postprocedure diagnosis: same EBL:    minimal Complications:   none immediate  See full dictation in [employer].  CHARM Toribio Faes MD Main # [phone] Pager  [phone] Mobile [phone]    "

## 2024-06-13 NOTE — Progress Notes (Signed)
 Pt. C/o of severe pain 12 on scale 1-10 for abdomninal pain. Pt. Med. Now with Dilaudid  1.0 mg slow IVP.

## 2024-06-13 NOTE — Progress Notes (Signed)
 Pt. States her upper abdominal pain is now a 5 on scale 1-10. I'm starting to get hungry.

## 2024-06-13 NOTE — Progress Notes (Signed)
 "  Hematology/Oncology Progress note [phone] [phone]     Patient Care Team: Arloa Elsie SAUNDERS, MD as PCP - General (Family Medicine) Martinez, Sarah A, PA-C (Physician Assistant) Maurie Rayfield BIRCH, RN as Oncology Nurse Navigator Babara Call, MD as Consulting Physician (Oncology)   Name of the patient: [name]  [id number]  Jul 15, 1955  Date of visit: 06/13/24   INTERVAL HISTORY-   Status post celiac plexus block today.  Patient reports abdominal pain has improved.  Bowel movement color dark brown.    Allergies[1]  Patient Active Problem List   Diagnosis Date Noted   Malignant neoplasm of both ovaries (HCC) 04/19/2022    Priority: High   Melena 05/17/2024    Priority: Medium    Iron  deficiency anemia due to chronic blood loss 05/17/2024    Priority: Medium    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    Priority: Medium    RA (rheumatoid arthritis) [facility])     Priority: Medium    Palliative care encounter 12/13/2023    Priority: Low   Anxiety     Priority: Low   Hepatitis C     Priority: Low   Acute esophagitis 06/11/2024   Goals of care, counseling/discussion 06/11/2024   Malignant neoplasm of ovary (HCC) 06/10/2024   Severe anemia 06/10/2024   Cancer associated pain 05/20/2024   COVID-19 virus infection 04/25/2024   Symptomatic anemia 04/24/2024   Anorexia 04/18/2024   Duodenal mass 03/31/2024   GI bleed 03/29/2024   Oral infection 10/18/2023   Pancytopenia, acquired (HCC) 08/03/2023   Mucositis due to chemotherapy 08/03/2023   Anemia due to antineoplastic chemotherapy 07/26/2023   Nausea without vomiting 07/12/2023   Rash and nonspecific skin eruption 07/12/2023   Genetic testing 10/16/2022   Port-A-Cath in place 09/15/2022   Leukopenia due to antineoplastic chemotherapy 07/17/2022   Bilateral leg edema 05/30/2022   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 05/30/2022   Chronic use  of opiate drug for therapeutic purpose 05/30/2022   Constipation 05/30/2022   Ovarian mass, left 04/19/2022   Multiple sclerosis 04/19/2022   Lumbar stenosis with neurogenic claudication 06/02/2020   Weakness of both lower extremities 06/02/2020   Lumbosacral radiculopathy 06/02/2020   Chest pain 10/18/2017   Hypertension    Osteoarthritis    Palpitations 02/02/2016   SYNCOPE AND COLLAPSE 10/23/2008   FIBROIDS, UTERUS 10/13/2008   Anxiety state 10/13/2008   DEPRESSION 10/13/2008   Essential hypertension 10/13/2008   VENTRICULAR TACHYCARDIA 10/13/2008   UNSPECIFIED PAROXYSMAL TACHYCARDIA 10/13/2008   Asthma 10/13/2008   GERD 10/13/2008   Endometriosis 10/13/2008   Osteoarthritis 10/13/2008   TACHYCARDIA 10/13/2008     Past Medical History:  Diagnosis Date   Anxiety    Asthma    Back problem    disc disease, chronic low back pain, right leg pain, s/p discectomy 99   Cancer (HCC)    ovarian cancer   Depression    Diastolic dysfunction    with elevated LVEDP at cath   Endometriosis    Fibroids    hx of    GERD (gastroesophageal reflux disease)    Heart murmur    Hepatitis C    treated 8-9 years ago   Hidradenitis suppurativa    History of kidney stones    Hypertension    MS (multiple sclerosis)    Neuromuscular disorder (HCC)    Mulitple sclerosis   Osteoarthritis    Pre-diabetes    Prediabetes  RA (rheumatoid arthritis) (HCC)    Rotator cuff tear    right shoulder    Syncope      Past Surgical History:  Procedure Laterality Date   ANGIOPLASTY     BACK SURGERY     lumbar surgery x 2 done in [place]  and [place]   COLON RESECTION SIGMOID  08/16/2022   Procedure: COLON RESECTION SIGMOID;  Surgeon: Viktoria Comer SAUNDERS, MD;  Location: [facility];  Service: Gynecology;;   COLONOSCOPY  07/2010   tubular adenoma   COLONOSCOPY  08/2015   CYSTOSCOPY N/A 08/16/2022   Procedure: CYSTOSCOPY;  Surgeon: Viktoria Comer SAUNDERS, MD;  Location: [facility];  Service:  Gynecology;  Laterality: N/A;   DISKECTOMY     ESOPHAGOGASTRODUODENOSCOPY N/A 03/30/2024   Procedure: EGD (ESOPHAGOGASTRODUODENOSCOPY);  Surgeon: Maryruth Ole DASEN, MD;  Location: [facility];  Service: Endoscopy;  Laterality: N/A;   HYSTERECTOMY ABDOMINAL WITH SALPINGO-OOPHORECTOMY  08/16/2022   Procedure: HYSTERECTOMY ABDOMINAL WITH SALPINGO-OOPHORECTOMY;  Surgeon: Viktoria Comer SAUNDERS, MD;  Location: [facility];  Service: Gynecology;;   IR ANGIOGRAM SELECTIVE EACH ADDITIONAL VESSEL  06/06/2024   IR ANGIOGRAM SELECTIVE EACH ADDITIONAL VESSEL  06/06/2024   IR ANGIOGRAM VISCERAL SELECTIVE  06/06/2024   IR EMBO TUMOR ORGAN ISCHEMIA INFARCT INC GUIDE ROADMAPPING  06/06/2024   IR IMAGING GUIDED PORT INSERTION  05/18/2022   IR IMAGING GUIDED PORT INSERTION  07/03/2023   IR IVC FILTER PLMT / S&I /IMG GUID/MOD SED  06/06/2024   IR RADIOLOGIST EVAL & MGMT  05/27/2024   IR REMOVAL TUN ACCESS W/ PORT W/O FL MOD SED  12/20/2022   IR US  GUIDE BX ASP/DRAIN  05/18/2022   IR US  GUIDE VASC ACCESS RIGHT  06/06/2024   LAPAROSCOPY N/A 08/16/2022   Procedure: LAPAROSCOPY DIAGNOSTIC;  Surgeon: Viktoria Comer SAUNDERS, MD;  Location: [facility];  Service: Gynecology;  Laterality: N/A;   laparoscopy for endometriosis     left shoulder scope     RADIOLOGY WITH ANESTHESIA N/A 05/18/2022   Procedure: IR WITH ANESTHESIA PORT AND BIOPSY;  Surgeon: Luverne Aran, MD;  Location: [facility];  Service: Radiology;  Laterality: N/A;   ROTATOR CUFF REPAIR Left    Spinal Fusion  12/2016   L4-S1    Social History   Socioeconomic History   Marital status: Single    Spouse name: Not on file   Number of children: 1   Years of education: Not on file   Highest education level: Not on file  Occupational History   Not on file  Tobacco Use   Smoking status: Some Days    Current packs/day: 0.00    Average packs/day: 0.1 packs/day for 27.0 years (2.7 ttl pk-yrs)    Types: Cigarettes    Start date: 33    Last attempt to quit: 2023    Years  since quitting: 3.1   Smokeless tobacco: Never  Vaping Use   Vaping status: Never Used  Substance and Sexual Activity   Alcohol use: Not Currently   Drug use: Yes    Types: Oxycodone    Sexual activity: Not Currently  Other Topics Concern   Not on file  Social History Narrative   Lives alone   Right handed   Caffeine: none    Social Drivers of Health   Tobacco Use: High Risk (06/10/2024)   Patient History    Smoking Tobacco Use: Some Days    Smokeless Tobacco Use: Never    Passive Exposure: Not on file  Financial Resource Strain: Not  on file  Food Insecurity: No Food Insecurity (06/11/2024)   Epic    Worried About Programme Researcher, Broadcasting/film/video in the Last Year: Never true    Ran Out of Food in the Last Year: Never true  Transportation Needs: No Transportation Needs (06/11/2024)   Epic    Lack of Transportation (Medical): No    Lack of Transportation (Non-Medical): No  Physical Activity: Not on file  Stress: Not on file  Social Connections: Socially Isolated (06/11/2024)   Social Connection and Isolation Panel    Frequency of Communication with Friends and Family: Three times a week    Frequency of Social Gatherings with Friends and Family: Never    Attends Religious Services: Never    Database Administrator or Organizations: No    Attends Banker Meetings: Never    Marital Status: Divorced  Catering Manager Violence: Not At Risk (06/11/2024)   Epic    Fear of Current or Ex-Partner: No    Emotionally Abused: No    Physically Abused: No    Sexually Abused: No  Depression (PHQ2-9): Low Risk (05/28/2024)   Depression (PHQ2-9)    PHQ-2 Score: 0  Alcohol Screen: Not on file  Housing: Low Risk (06/11/2024)   Epic    Unable to Pay for Housing in the Last Year: No    Number of Times Moved in the Last Year: 0    Homeless in the Last Year: No  Utilities: Not At Risk (06/11/2024)   Epic    Threatened with loss of utilities: No  Health Literacy: Not on file     Family History   Problem Relation Age of Onset   Heart disease Mother        Died with MI 63, chest pain 30s   Hypertension Mother    Pancreatitis Father    Atrial fibrillation Sister    Breast cancer Maternal Aunt        dx <50   Stomach cancer Maternal Grandfather        mets to liver? dx after 50   Multiple sclerosis Cousin    Breast cancer Cousin        mat female cousin; dx unknown age   Colon cancer Cousin 47       mat female cousin; mets   Leukemia Cousin 50       mat female cousin    Current Medications[2]   Physical exam:  Vitals:   06/13/24 1431 06/13/24 1445 06/13/24 1500 06/13/24 1617  BP: 98/77 100/71 94/69 92/63   Pulse: 88   93  Resp: 16 20 18 16   Temp:   99 F (37.2 C) 97.8 F (36.6 C)  TempSrc:   Temporal Oral  SpO2: 97%   99%  Weight:      Height:       Physical Exam Constitutional:      General: She is not in acute distress.    Appearance: She is not diaphoretic.  HENT:     Head: Normocephalic and atraumatic.  Cardiovascular:     Rate and Rhythm: Normal rate and regular rhythm.  Pulmonary:     Effort: Pulmonary effort is normal. No respiratory distress.     Breath sounds: Normal breath sounds.  Abdominal:     General: There is no distension.     Palpations: Abdomen is soft.  Musculoskeletal:        General: Normal range of motion.     Cervical back: Normal range of motion  and neck supple.     Right lower leg: Edema present.     Left lower leg: Edema present.  Skin:    General: Skin is warm and dry.     Findings: No erythema.  Neurological:     Mental Status: She is alert and oriented to person, place, and time. Mental status is at baseline.     Motor: No abnormal muscle tone.  Psychiatric:        Mood and Affect: Mood and affect normal.       Labs    Latest Ref Rng & Units 06/13/2024    5:12 AM 06/12/2024    5:32 AM 06/11/2024    6:02 AM  CBC  WBC 4.0 - 10.5 K/uL 9.8  10.0  7.6   Hemoglobin 12.0 - 15.0 g/dL 7.8  7.9  8.2   Hematocrit 36.0 - 46.0  % 24.7  25.1  26.0   Platelets 150 - 400 K/uL 341  278  259       Latest Ref Rng & Units 06/13/2024    5:12 AM 06/12/2024    5:32 AM 06/11/2024    4:43 AM  CMP  Glucose 70 - 99 mg/dL 890  890  891   BUN 8 - 23 mg/dL 10  9  10    Creatinine 0.44 - 1.00 mg/dL 9.32  9.35  9.32   Sodium 135 - 145 mmol/L 134  130  131   Potassium 3.5 - 5.1 mmol/L 4.3  4.0  4.7   Chloride 98 - 111 mmol/L 98  96  99   CO2 22 - 32 mmol/L 27  26  22    Calcium 8.9 - 10.3 mg/dL 8.4  7.9  7.6      RADIOGRAPHIC STUDIES: I have personally reviewed the radiological images as listed and agreed with the findings in the report. CT Angio Abd/Pel W and/or Wo Contrast Result Date: 06/10/2024 CLINICAL DATA:  Metastatic ovarian cancer with worsening abdominal pain EXAM: CTA ABDOMEN AND PELVIS WITHOUT AND WITH CONTRAST TECHNIQUE: Multidetector CT imaging of the abdomen and pelvis was performed using the standard protocol during bolus administration of intravenous contrast. Multiplanar reconstructed images and MIPs were obtained and reviewed to evaluate the vascular anatomy. RADIATION DOSE REDUCTION: This exam was performed according to the departmental dose-optimization program which includes automated exposure control, adjustment of the mA and/or kV according to patient size and/or use of iterative reconstruction technique. CONTRAST:  OMNIPAQUE  IOHEXOL  350 MG/ML SOLN COMPARISON:  CTA chest dated 06/06/2024, CTA abdomen and pelvis dated 06/05/2024 and multiple priors FINDINGS: VASCULAR Aorta: Normal caliber aorta without aneurysm, dissection, vasculitis or significant stenosis. Mild aortic atherosclerosis. Celiac: Patent without evidence of aneurysm, dissection, vasculitis or significant stenosis. Prior gastroduodenal artery embolization. SMA: Patent without evidence of aneurysm, dissection, vasculitis or significant stenosis. Renals: Single bilateral renal arteries. Both renal arteries are patent without evidence of aneurysm,  dissection, vasculitis, fibromuscular dysplasia or significant stenosis. IMA: Patent without evidence of aneurysm, dissection, vasculitis or significant stenosis. Inflow: Mild narrowing of the bilateral internal iliac artery origins due to atherosclerotic plaque. Otherwise patent without evidence of aneurysm, dissection, vasculitis or significant stenosis. Proximal Outflow: Bilateral common femoral and visualized portions of the superficial and profunda femoral arteries are patent without evidence of aneurysm, dissection, vasculitis or significant stenosis. Veins: Interval placement of infrarenal IVC filter. Focal filling defect within the right great saphenous vein immediately distal to the saphenofemoral junction (16:197), which appears new compared to 06/05/2024. Filling defect within the  left common femoral vein (16:210). Review of the MIP images confirms the above findings. NON-VASCULAR Lower chest: No focal consolidation or pulmonary nodule in the lung bases. No pleural effusion or pneumothorax demonstrated. Partially imaged heart size is normal. Unchanged partially imaged bilateral lower lobe pulmonary emboli. Unchanged 3.4 x 2.5 cm right cardiophrenic nodule (16: 14). Hepatobiliary: Again seen are heterogeneously enhancing bilobar hepatic masses. Index lesions: -3.8 x 2.8 cm segment 7/8 (16:38) unchanged when remeasured -9.5 x 6.2 cm segment 4 (16:29), previously 8.7 x 6.6 cm -6.2 x 4.5 cm segment 2 (16:27), previously 5.4 x 4.2 cm No intra or extrahepatic biliary ductal dilation. Gallbladder is laterally displaced. Mild gallbladder mural thickening and pericholecystic free fluid. Pancreas: No focal lesions or main ductal dilation. Spleen: Normal in size without focal abnormality. Adrenals/Urinary Tract: Unchanged right adrenal nodule size measuring 2.9 x 2.1 cm (16:51) when remeasured. Nodule measures 23 HU precontrast. No left adrenal nodule. Duplex kidneys. No suspicious renal mass or hydronephrosis.  Unchanged linear 7 mm calculus at the left ureterovesical junction (16:171). No focal bladder wall thickening. Stomach/Bowel: Normal appearance of the stomach. Again seen is heterogeneously enhancing mass within the right hemiabdomen centered at the proximal duodenum measuring 11.0 x 8.6 cm (16:78), previously 9.8 x 9.2 cm when measured similarly. The mass continues exerts local mass effect with lateral displacement of the gallbladder and continued effacement of the SMV portal confluence. Findings suspicious for interval invasion of the proximal transverse colon with marked luminal narrowing. Along the periphery of this mass, there are areas of irregular arterial hyperenhancement, for example posteroinferior (9:110), which expands on the venous phase image (16:107). Appendix is not discretely seen. Lymphatic: Slightly increased size of right pelvic sidewall nodule measuring 1.6 x 1.4 cm (16:152), previously 1.5 x 1.2 cm. Unchanged 1.0 cm left para-aortic lymph node (16:86). Increased size of right lower quadrant nodule measuring 3.5 x 2.6 cm (16:130), previously 3 1 x 2.5 cm (remeasured). Reproductive: Status post hysterectomy and bilateral salpingo oophorectomy. Unchanged 2.0 x 1.8 cm nodular soft tissue focus, inseparable from the right vaginal cuff (16:163). Other: Trace right hemi abdominal free fluid. No fluid collection. Asymmetric thickening of the right anterior peritoneum adjacent to the duodenal mass. Musculoskeletal: No acute or abnormal lytic or blastic osseous lesions. Postsurgical changes of L4-S1 spinal fusion. Hardware appears intact. Subcutaneous soft tissue stranding and small focus of hyperdensity in the right gluteal region, likely postprocedural. Mild body wall edema. Small fat-containing paraumbilical hernia. IMPRESSION: 1. No acute aortic pathology. 2. Interval placement of infrarenal IVC filter. Filling defect within the right great saphenous vein immediately distal to the saphenofemoral  femoral junction, which appears new compared to 06/05/2024, suspicious for thrombus. Filling defect within the left common femoral vein, consistent with known thrombus. 3. Unchanged partially imaged bilateral lower lobe pulmonary emboli. 4. Increased size of the heterogeneously enhancing mass within the right hemiabdomen centered at the proximal duodenum with findings suspicious for interval invasion of the proximal transverse colon resulting in marked luminal narrowing. Along the periphery of this mass, there are areas of irregular arterial hyperenhancement, which expands on the venous phase image, favored to represent areas of progressive tumoral enhancement, although areas of active extravasation could have a similar appearance. 5. Bilobar hepatic metastases, many of which have increased in size. Increased size of right lower quadrant nodule and right pelvic sidewall nodule. 6. Unchanged right cardiophrenic and adrenal nodules and left para-aortic lymph node. 7. Unchanged linear 7 mm calculus at the left ureterovesical junction. No hydronephrosis.  8.  Aortic Atherosclerosis (ICD10-I70.0). Electronically Signed   By: Limin  Xu M.D.   On: 06/10/2024 15:48   DG Chest Portable 1 View Result Date: 06/10/2024 CLINICAL DATA:  Shortness of breath EXAM: PORTABLE CHEST 1 VIEW COMPARISON:  June 04, 2024 FINDINGS: The heart size and mediastinal contours are within normal limits. Right internal jugular Port-A-Cath is unchanged. Both lungs are clear. The visualized skeletal structures are unremarkable. IMPRESSION: No active disease. Electronically Signed   By: Lynwood Landy Raddle M.D.   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 EXAM: Title; COIL EMBOLIZATION OF GASTRODUODENAL ARTERY Listed procedures; 1.  ULTRASOUND-GUIDED RIGHT COMMON FEMORAL  ARTERY ACCESS 2. MESENTERIC ARTERIOGRAPHY, including CELIAC, SUPERIOR MESENTERIC ARTERY and GASTRODUODENAL ARTERIOGRAMS 3. COIL EMBOLIZATION of the GASTRODUODENAL ARTERY 4. GELFOAM EMBOLIZATION of SUPERIOR MESENTERIC ARTERY BRANCHES COMPARISON:  CT AP, 06/05/2024. MEDICATIONS: 2 g cefoxitin  IV. The antibiotics were administered within 60 minutes of procedural initiation. 4 mg Zofran  IV.  50 mg albumin  IV ANESTHESIA/SEDATION: Moderate (conscious) sedation was employed during this procedure. A total of Versed  5 mg and Fentanyl  250 mcg was administered intravenously. Moderate Sedation Time: 90 minutes. The patient's level of consciousness and vital signs were monitored continuously by radiology nursing throughout the procedure under my direct supervision. CONTRAST:  140 mL Omnipaque  300 FLUOROSCOPY: Radiation Exposure Index and estimated peak skin dose (PSD); Reference air kerma (RAK), 429.3 mGy. COMPLICATIONS: None immediate. PROCEDURE: Informed consent was obtained from the patient and/or patient's representative following explanation of the procedure, risks, benefits and alternatives. All questions were addressed. A time out was performed prior to the initiation of the procedure. Maximal barrier sterile technique utilized including caps, mask, sterile gowns, sterile gloves, large sterile drape, hand hygiene, and chlorhexidine  prep. The RIGHT femoral head was marked fluoroscopically. Under sterile conditions and local anesthesia, the RIGHT common femoral artery access was performed with a micropuncture needle. Under direct ultrasound guidance, the RIGHT common femoral was accessed with a micropuncture kit. An ultrasound image was saved for documentation purposes. This allowed for placement of a 6 Fr 35 cm vascular sheath. A limited arteriogram was performed through the side arm of the sheath confirming appropriate access within the RIGHT common femoral artery. A limited abdominal aortogram was performed to help  identify the celiac artery origin. Over a Bentson wire, a C2 catheter was advanced, back bled and flushed. The catheter was then utilized to select the celiac access, then advanced into the common hepatic then gastroduodenal arteries. Selective mesenteric arteriograms were performed at each level. Using a 2.8 Fr Progreat microcatheter and 0.016 inch Fathom microwire access into the gastroduodenal artery was performed and a selective arteriogram was performed. Selective embolization with multiple 0.018 inch micro coils was performed. The microcatheter was removed and arteriogram with the 5 Fr catheter at the common hepatic artery was performed. Adequate pruning of the GDA was achieved on post embolization arteriogram. The C2 catheter was replaced for a Sos catheter, which was then used to select the superior mesenteric artery. Additional selective arteriogram was performed and access into the branches of the SMA, including the inferior pancreaticoduodenal and middle colic artery was obtained with the microcatheter. Gel-Foam embolization was then performed to near-stasis. Post embolization arteriogram demonstrated adequate pruning of the SMA branches supplying the tumor. Images were reviewed and the procedure was terminated. All wires, catheters and sheaths were removed  from the patient. Hemostasis was achieved at the RIGHT groin access site with Angio-Seal closure. The patient tolerated the procedure well without immediate post procedural complication. FINDINGS: *Celiac and superior mesenteric arteriograms with normal order and branching. *Multivessel arterial supply to the large duodenal tumor, from the GDA and branches off the SMA, including the inferior pancreaticoduodenal and middle colic arteries. Tumor blush was identified, however no active extravasation was noted. *Coil embolization of the GDA, with additional Gelfoam embolization of the aforementioned SMA branches, with adequate pruning of the vasculature.  IMPRESSION: 1. Mesenteric arteriography for duodenal mass, with arterial supply from the celiac axis at the GDA, and the SMA at the inferior pancreaticoduodenal and middle colic arteries. Tumor blush without evidence of active extravasation from the duodenal mass. 2. Successful coil embolization of the gastroduodenal artery, and Gelfoam embolization of the SMA branches without significant residual tumoral enhancement. Thom Hall, MD [facility] Radiology Electronically Signed   By: Thom Hall M.D.   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 EXAM: Title; COIL EMBOLIZATION OF GASTRODUODENAL ARTERY Listed procedures; 1.  ULTRASOUND-GUIDED RIGHT COMMON FEMORAL ARTERY ACCESS 2. MESENTERIC ARTERIOGRAPHY, including CELIAC, SUPERIOR MESENTERIC ARTERY and GASTRODUODENAL ARTERIOGRAMS 3. COIL EMBOLIZATION of the GASTRODUODENAL ARTERY 4. GELFOAM EMBOLIZATION of SUPERIOR MESENTERIC ARTERY BRANCHES COMPARISON:  CT AP, 06/05/2024. MEDICATIONS: 2 g cefoxitin  IV. The antibiotics were administered within 60 minutes of procedural initiation. 4 mg Zofran  IV.  50 mg albumin  IV ANESTHESIA/SEDATION: Moderate (conscious) sedation was employed during this procedure. A total of Versed  5 mg and Fentanyl  250 mcg was administered intravenously. Moderate Sedation Time: 90 minutes. The patient's level of consciousness and vital signs were monitored continuously by radiology nursing throughout the procedure under my direct supervision. CONTRAST:  140 mL Omnipaque  300 FLUOROSCOPY: Radiation Exposure Index and estimated peak skin dose (PSD); Reference air kerma (RAK), 429.3 mGy. COMPLICATIONS: None immediate. PROCEDURE: Informed consent was obtained from the patient and/or patient's representative following explanation  of the procedure, risks, benefits and alternatives. All questions were addressed. A time out was performed prior to the initiation of the procedure. Maximal barrier sterile technique utilized including caps, mask, sterile gowns, sterile gloves, large sterile drape, hand hygiene, and chlorhexidine  prep. The RIGHT femoral head was marked fluoroscopically. Under sterile conditions and local anesthesia, the RIGHT common femoral artery access was performed with a micropuncture needle. Under direct ultrasound guidance, the RIGHT common femoral was accessed with a micropuncture kit. An ultrasound image was saved for documentation purposes. This allowed for placement of a 6 Fr 35 cm vascular sheath. A limited arteriogram was performed through the side arm of the sheath confirming appropriate access within the RIGHT common femoral artery. A limited abdominal aortogram was performed to help identify the celiac artery origin. Over a Bentson wire, a C2 catheter was advanced, back bled and flushed. The catheter was then utilized to select the celiac access, then advanced into the common hepatic then gastroduodenal arteries. Selective mesenteric arteriograms were performed at each level. Using a 2.8 Fr Progreat microcatheter and 0.016 inch Fathom microwire access into the gastroduodenal artery was performed and a selective arteriogram was performed. Selective embolization with multiple 0.018 inch micro coils was performed. The microcatheter was removed and arteriogram with the 5 Fr catheter at the common hepatic artery was performed. Adequate pruning of the GDA was achieved on post embolization  arteriogram. The C2 catheter was replaced for a Sos catheter, which was then used to select the superior mesenteric artery. Additional selective arteriogram was performed and access into the branches of the SMA, including the inferior pancreaticoduodenal and middle colic artery was obtained with the microcatheter. Gel-Foam embolization was  then performed to near-stasis. Post embolization arteriogram demonstrated adequate pruning of the SMA branches supplying the tumor. Images were reviewed and the procedure was terminated. All wires, catheters and sheaths were removed from the patient. Hemostasis was achieved at the RIGHT groin access site with Angio-Seal closure. The patient tolerated the procedure well without immediate post procedural complication. FINDINGS: *Celiac and superior mesenteric arteriograms with normal order and branching. *Multivessel arterial supply to the large duodenal tumor, from the GDA and branches off the SMA, including the inferior pancreaticoduodenal and middle colic arteries. Tumor blush was identified, however no active extravasation was noted. *Coil embolization of the GDA, with additional Gelfoam embolization of the aforementioned SMA branches, with adequate pruning of the vasculature. IMPRESSION: 1. Mesenteric arteriography for duodenal mass, with arterial supply from the celiac axis at the GDA, and the SMA at the inferior pancreaticoduodenal and middle colic arteries. Tumor blush without evidence of active extravasation from the duodenal mass. 2. Successful coil embolization of the gastroduodenal artery, and Gelfoam embolization of the SMA branches without significant residual tumoral enhancement. Thom Hall, MD [facility] Radiology Electronically Signed   By: Thom Hall M.D.   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 EXAM: Title; COIL EMBOLIZATION OF GASTRODUODENAL ARTERY Listed procedures; 1.  ULTRASOUND-GUIDED RIGHT COMMON FEMORAL ARTERY ACCESS 2. MESENTERIC ARTERIOGRAPHY, including CELIAC, SUPERIOR MESENTERIC ARTERY and GASTRODUODENAL ARTERIOGRAMS 3. COIL EMBOLIZATION of  the GASTRODUODENAL ARTERY 4. GELFOAM EMBOLIZATION of SUPERIOR MESENTERIC ARTERY BRANCHES COMPARISON:  CT AP, 06/05/2024. MEDICATIONS: 2 g cefoxitin  IV. The antibiotics were administered within 60 minutes of procedural initiation. 4 mg Zofran  IV.  50 mg albumin  IV ANESTHESIA/SEDATION: Moderate (conscious) sedation was employed during this procedure. A total of Versed  5 mg and Fentanyl  250 mcg was administered intravenously. Moderate Sedation Time: 90 minutes. The patient's level of consciousness and vital signs were monitored continuously by radiology nursing throughout the procedure under my direct supervision. CONTRAST:  140 mL Omnipaque  300 FLUOROSCOPY: Radiation Exposure Index and estimated peak skin dose (PSD); Reference air kerma (RAK), 429.3 mGy. COMPLICATIONS: None immediate. PROCEDURE: Informed consent was obtained from the patient and/or patient's representative following explanation of the procedure, risks, benefits and alternatives. All questions were addressed. A time out was performed prior to the initiation of the procedure. Maximal barrier sterile technique utilized including caps, mask, sterile gowns, sterile gloves, large sterile drape, hand hygiene, and chlorhexidine  prep. The RIGHT femoral head was marked fluoroscopically. Under sterile conditions and local anesthesia, the RIGHT common femoral artery access was performed with a micropuncture needle. Under direct ultrasound guidance, the RIGHT common femoral was accessed with a micropuncture kit. An ultrasound image was saved for documentation purposes. This allowed for placement of a 6 Fr 35 cm vascular sheath. A limited arteriogram was performed through the side arm of the sheath confirming appropriate access within the RIGHT common femoral artery. A limited abdominal aortogram was performed to help identify the celiac artery origin. Over a Bentson wire, a C2 catheter was advanced, back bled and flushed. The catheter was then utilized to  select  the celiac access, then advanced into the common hepatic then gastroduodenal arteries. Selective mesenteric arteriograms were performed at each level. Using a 2.8 Fr Progreat microcatheter and 0.016 inch Fathom microwire access into the gastroduodenal artery was performed and a selective arteriogram was performed. Selective embolization with multiple 0.018 inch micro coils was performed. The microcatheter was removed and arteriogram with the 5 Fr catheter at the common hepatic artery was performed. Adequate pruning of the GDA was achieved on post embolization arteriogram. The C2 catheter was replaced for a Sos catheter, which was then used to select the superior mesenteric artery. Additional selective arteriogram was performed and access into the branches of the SMA, including the inferior pancreaticoduodenal and middle colic artery was obtained with the microcatheter. Gel-Foam embolization was then performed to near-stasis. Post embolization arteriogram demonstrated adequate pruning of the SMA branches supplying the tumor. Images were reviewed and the procedure was terminated. All wires, catheters and sheaths were removed from the patient. Hemostasis was achieved at the RIGHT groin access site with Angio-Seal closure. The patient tolerated the procedure well without immediate post procedural complication. FINDINGS: *Celiac and superior mesenteric arteriograms with normal order and branching. *Multivessel arterial supply to the large duodenal tumor, from the GDA and branches off the SMA, including the inferior pancreaticoduodenal and middle colic arteries. Tumor blush was identified, however no active extravasation was noted. *Coil embolization of the GDA, with additional Gelfoam embolization of the aforementioned SMA branches, with adequate pruning of the vasculature. IMPRESSION: 1. Mesenteric arteriography for duodenal mass, with arterial supply from the celiac axis at the GDA, and the SMA at the inferior  pancreaticoduodenal and middle colic arteries. Tumor blush without evidence of active extravasation from the duodenal mass. 2. Successful coil embolization of the gastroduodenal artery, and Gelfoam embolization of the SMA branches without significant residual tumoral enhancement. Thom Hall, MD [facility] Radiology Electronically Signed   By: Thom Hall M.D.   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 EXAM: Title; COIL EMBOLIZATION OF GASTRODUODENAL ARTERY Listed procedures; 1.  ULTRASOUND-GUIDED RIGHT COMMON FEMORAL ARTERY ACCESS 2. MESENTERIC ARTERIOGRAPHY, including CELIAC, SUPERIOR MESENTERIC ARTERY and GASTRODUODENAL ARTERIOGRAMS 3. COIL EMBOLIZATION of the GASTRODUODENAL ARTERY 4. GELFOAM EMBOLIZATION of SUPERIOR MESENTERIC ARTERY BRANCHES COMPARISON:  CT AP, 06/05/2024. MEDICATIONS: 2 g cefoxitin  IV. The antibiotics were administered within 60 minutes of procedural initiation. 4 mg Zofran  IV.  50 mg albumin  IV ANESTHESIA/SEDATION: Moderate (conscious) sedation was employed during this procedure. A total of Versed  5 mg and Fentanyl  250 mcg was administered intravenously. Moderate Sedation Time: 90 minutes. The patient's level of consciousness and vital signs were monitored continuously by radiology nursing throughout the procedure under my direct supervision. CONTRAST:  140 mL Omnipaque  300 FLUOROSCOPY: Radiation Exposure Index and estimated peak skin dose (PSD); Reference air kerma (RAK), 429.3 mGy. COMPLICATIONS: None immediate. PROCEDURE: Informed consent was obtained from the patient and/or patient's representative following explanation of the procedure, risks, benefits and alternatives. All questions were addressed. A time out was performed prior to the  initiation of the procedure. Maximal barrier sterile technique utilized including caps, mask, sterile gowns, sterile gloves, large sterile drape, hand hygiene, and chlorhexidine  prep. The RIGHT femoral head was marked fluoroscopically. Under sterile conditions and local anesthesia, the RIGHT common femoral artery access was performed with a micropuncture needle. Under direct ultrasound  guidance, the RIGHT common femoral was accessed with a micropuncture kit. An ultrasound image was saved for documentation purposes. This allowed for placement of a 6 Fr 35 cm vascular sheath. A limited arteriogram was performed through the side arm of the sheath confirming appropriate access within the RIGHT common femoral artery. A limited abdominal aortogram was performed to help identify the celiac artery origin. Over a Bentson wire, a C2 catheter was advanced, back bled and flushed. The catheter was then utilized to select the celiac access, then advanced into the common hepatic then gastroduodenal arteries. Selective mesenteric arteriograms were performed at each level. Using a 2.8 Fr Progreat microcatheter and 0.016 inch Fathom microwire access into the gastroduodenal artery was performed and a selective arteriogram was performed. Selective embolization with multiple 0.018 inch micro coils was performed. The microcatheter was removed and arteriogram with the 5 Fr catheter at the common hepatic artery was performed. Adequate pruning of the GDA was achieved on post embolization arteriogram. The C2 catheter was replaced for a Sos catheter, which was then used to select the superior mesenteric artery. Additional selective arteriogram was performed and access into the branches of the SMA, including the inferior pancreaticoduodenal and middle colic artery was obtained with the microcatheter. Gel-Foam embolization was then performed to near-stasis. Post embolization arteriogram demonstrated adequate pruning of the SMA branches  supplying the tumor. Images were reviewed and the procedure was terminated. All wires, catheters and sheaths were removed from the patient. Hemostasis was achieved at the RIGHT groin access site with Angio-Seal closure. The patient tolerated the procedure well without immediate post procedural complication. FINDINGS: *Celiac and superior mesenteric arteriograms with normal order and branching. *Multivessel arterial supply to the large duodenal tumor, from the GDA and branches off the SMA, including the inferior pancreaticoduodenal and middle colic arteries. Tumor blush was identified, however no active extravasation was noted. *Coil embolization of the GDA, with additional Gelfoam embolization of the aforementioned SMA branches, with adequate pruning of the vasculature. IMPRESSION: 1. Mesenteric arteriography for duodenal mass, with arterial supply from the celiac axis at the GDA, and the SMA at the inferior pancreaticoduodenal and middle colic arteries. Tumor blush without evidence of active extravasation from the duodenal mass. 2. Successful coil embolization of the gastroduodenal artery, and Gelfoam embolization of the SMA branches without significant residual tumoral enhancement. Thom Hall, MD [facility] Radiology Special[facility] Radiology Electronically Signed   By: Thom Hall M.D.   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 EXAM: Title; COIL EMBOLIZATION OF GASTRODUODENAL ARTERY Listed procedures; 1.  ULTRASOUND-GUIDED RIGHT COMMON FEMORAL ARTERY ACCESS 2. MESENTERIC ARTERIOGRAPHY, including CELIAC, SUPERIOR MESENTERIC ARTERY and GASTRODUODENAL ARTERIOGRAMS 3. COIL EMBOLIZATION of the GASTRODUODENAL ARTERY 4. GELFOAM EMBOLIZATION of SUPERIOR MESENTERIC ARTERY BRANCHES  COMPARISON:  CT AP, 06/05/2024. MEDICATIONS: 2 g cefoxitin  IV. The antibiotics were administered within 60 minutes of procedural initiation. 4 mg Zofran  IV.  50 mg albumin  IV ANESTHESIA/SEDATION: Moderate (conscious) sedation was employed during this procedure. A total of Versed  5 mg and Fentanyl  250 mcg was administered intravenously. Moderate Sedation Time: 90 minutes. The patient's level of consciousness and vital signs were monitored continuously by radiology nursing throughout the procedure under my direct supervision. CONTRAST:  140 mL Omnipaque  300 FLUOROSCOPY: Radiation Exposure Index and estimated peak skin dose (PSD); Reference air kerma (RAK), 429.3 mGy. COMPLICATIONS:  None immediate. PROCEDURE: Informed consent was obtained from the patient and/or patient's representative following explanation of the procedure, risks, benefits and alternatives. All questions were addressed. A time out was performed prior to the initiation of the procedure. Maximal barrier sterile technique utilized including caps, mask, sterile gowns, sterile gloves, large sterile drape, hand hygiene, and chlorhexidine  prep. The RIGHT femoral head was marked fluoroscopically. Under sterile conditions and local anesthesia, the RIGHT common femoral artery access was performed with a micropuncture needle. Under direct ultrasound guidance, the RIGHT common femoral was accessed with a micropuncture kit. An ultrasound image was saved for documentation purposes. This allowed for placement of a 6 Fr 35 cm vascular sheath. A limited arteriogram was performed through the side arm of the sheath confirming appropriate access within the RIGHT common femoral artery. A limited abdominal aortogram was performed to help identify the celiac artery origin. Over a Bentson wire, a C2 catheter was advanced, back bled and flushed. The catheter was then utilized to select the celiac access, then advanced into the common hepatic then gastroduodenal arteries.  Selective mesenteric arteriograms were performed at each level. Using a 2.8 Fr Progreat microcatheter and 0.016 inch Fathom microwire access into the gastroduodenal artery was performed and a selective arteriogram was performed. Selective embolization with multiple 0.018 inch micro coils was performed. The microcatheter was removed and arteriogram with the 5 Fr catheter at the common hepatic artery was performed. Adequate pruning of the GDA was achieved on post embolization arteriogram. The C2 catheter was replaced for a Sos catheter, which was then used to select the superior mesenteric artery. Additional selective arteriogram was performed and access into the branches of the SMA, including the inferior pancreaticoduodenal and middle colic artery was obtained with the microcatheter. Gel-Foam embolization was then performed to near-stasis. Post embolization arteriogram demonstrated adequate pruning of the SMA branches supplying the tumor. Images were reviewed and the procedure was terminated. All wires, catheters and sheaths were removed from the patient. Hemostasis was achieved at the RIGHT groin access site with Angio-Seal closure. The patient tolerated the procedure well without immediate post procedural complication. FINDINGS: *Celiac and superior mesenteric arteriograms with normal order and branching. *Multivessel arterial supply to the large duodenal tumor, from the GDA and branches off the SMA, including the inferior pancreaticoduodenal and middle colic arteries. Tumor blush was identified, however no active extravasation was noted. *Coil embolization of the GDA, with additional Gelfoam embolization of the aforementioned SMA branches, with adequate pruning of the vasculature. IMPRESSION: 1. Mesenteric arteriography for duodenal mass, with arterial supply from the celiac axis at the GDA, and the SMA at the inferior pancreaticoduodenal and middle colic arteries. Tumor blush without evidence of active  extravasation from the duodenal mass. 2. Successful coil embolization of the gastroduodenal artery, and Gelfoam embolization of the SMA branches without significant residual tumoral enhancement. Thom Hall, MD [facility] Radiology Specialists [facility] Radiology Electronically Signed   By: Thom Hall M.D.   On: 06/09/2024 14:52   VAS US  LOWER EXTREMITY VENOUS (DVT) Result Date: 06/09/2024  Lower Venous DVT Study Patient Name:  KHRISTI SCHILLER  Date of Exam:   06/06/2024 Medical Rec #: [id number]        Accession #:    [id number] Date of Birth: 1956/04/20       Patient Gender: F Patient Age:   43 years Exam Location:  [facility] Procedure:      VAS US  LOWER EXTREMITY VENOUS (DVT) Referring Phys: THOM HALL --------------------------------------------------------------------------------  Indications:  Incidental pulmonary embolism on abdominal CTA 06/05/24 Other Indications: Status post mesenteric arteriography and duodenal mass                    embolization with IVC filter placement 06/06/24. 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 Limitations: Bandage right groin. Comparison Study: Prior negative left LEV done 05/29/22 Performing Technologist: Alberta Lis RVS  Examination Guidelines: A complete evaluation includes B-mode imaging, spectral Doppler, color Doppler, and power Doppler as needed of all accessible portions of each vessel. Bilateral testing is considered an integral part of a complete examination. Limited examinations for reoccurring indications may be performed as noted. The reflux portion of the exam is performed with the patient in reverse Trendelenburg.  +---------+---------------+---------+-----------+----------+--------------+ RIGHT    CompressibilityPhasicitySpontaneityPropertiesThrombus Aging +---------+---------------+---------+-----------+----------+--------------+ CFV      Full           Yes      No                                   +---------+---------------+---------+-----------+----------+--------------+ SFJ      Full                                                        +---------+---------------+---------+-----------+----------+--------------+ FV Prox  Full           Yes      No                                  +---------+---------------+---------+-----------+----------+--------------+ FV Mid   Full                                                        +---------+---------------+---------+-----------+----------+--------------+ FV DistalFull                                                        +---------+---------------+---------+-----------+----------+--------------+ PFV      Full           Yes      No                                  +---------+---------------+---------+-----------+----------+--------------+ POP      Full           Yes      No                                  +---------+---------------+---------+-----------+----------+--------------+ PTV      Full                                                        +---------+---------------+---------+-----------+----------+--------------+  PERO     Full                                                        +---------+---------------+---------+-----------+----------+--------------+   +---------+---------------+---------+-----------+----------+--------------+ LEFT     CompressibilityPhasicitySpontaneityPropertiesThrombus Aging +---------+---------------+---------+-----------+----------+--------------+ CFV      Full           Yes      No                                  +---------+---------------+---------+-----------+----------+--------------+ SFJ      Full                                                        +---------+---------------+---------+-----------+----------+--------------+ FV Prox  None           No       No                   Acute           +---------+---------------+---------+-----------+----------+--------------+ FV Mid   None           No       No                   Acute          +---------+---------------+---------+-----------+----------+--------------+ FV DistalNone           No       No                   Acute          +---------+---------------+---------+-----------+----------+--------------+ PFV      Full           Yes      No                                  +---------+---------------+---------+-----------+----------+--------------+ POP      None           No       No                   Acute          +---------+---------------+---------+-----------+----------+--------------+ PTV      None                                         Acute          +---------+---------------+---------+-----------+----------+--------------+ PERO     None                                         Acute          +---------+---------------+---------+-----------+----------+--------------+     Summary: RIGHT: - There is no evidence of deep vein thrombosis in the lower extremity.  - No cystic structure found in  the popliteal fossa.  LEFT: - Findings consistent with acute deep vein thrombosis involving the left femoral vein, left popliteal vein, left posterior tibial veins, and left peroneal veins.  - No cystic structure found in the popliteal fossa.  *See table(s) above for measurements and observations. Electronically signed by Gaile New MD on 06/09/2024 at 10:45:02 AM.    Final    IR IVC FILTER PLMT / S&I PORTER GUID/MOD SED Result Date: 06/07/2024 INDICATION: duodenal mass Briefly, 69 year old female with a history of metastatic ovarian cancer, bleeding duodenal metastasis, and new, incidental PE and LEFT lower extremity DVT. Unable to anticoagulate. EXAM: Title; INFERIOR VENA CAVA (IVC) FILTER PLACEMENT Listed procedures; 1. ULTRASOUND-GUIDED RIGHT GREATER SAPHENOUS VEIN ACCESS 2. CENTRAL VENOGRAM 3. INFERIOR VENA CAVA FILTER  PLACEMENT MEDICATIONS: None. ANESTHESIA/SEDATION: Moderate (conscious) sedation was employed during this procedure. A total of Versed  1 mg and Fentanyl  50 mcg was administered intravenously. Moderate Sedation Time: 18 minutes. The patient's level of consciousness and vital signs were monitored continuously by radiology nursing throughout the procedure under my direct supervision. CONTRAST:  60 mL Omnipaque  300 FLUOROSCOPY: Radiation Exposure Index and estimated peak skin dose (PSD); Reference air kerma (RAK), 111.9 mGy. COMPLICATIONS: None immediate. PROCEDURE: Informed written consent was obtained from the patient and/or patient's representative following explanation of the procedure, risks, benefits and alternatives. A time out was performed prior to the initiation of the procedure. Maximal barrier sterile technique utilized including caps, mask, sterile gowns, sterile gloves, large sterile drape, hand hygiene, and sterile prep. Under sterile condition and local anesthesia, RIGHT greater saphenous vein access was performed with ultrasound. An ultrasound image was saved and sent to PACS. Over a guidewire, the IVC filter delivery sheath and inner dilator were advanced into the IVC just above the IVC bifurcation. Contrast injection was performed for an IVC venogram. Through the delivery sheath, a retrievable Denali IVC filter was deployed below the level of the renal veins and above the IVC bifurcation. Limited post deployment venacavagram was performed. The delivery sheath was removed and hemostasis was obtained with manual compression. A dressing was placed. The patient tolerated the procedure well without immediate post procedural complication. FINDINGS: The IVC is patent. No evidence of thrombus, stenosis, or occlusion. No variant venous anatomy. IMPRESSION: Successful placement of a retrievable infrarenal inferior vena cava (IVC) filter PLAN: IVC filters can cause complications when left in place for extended  periods of time. If medically appropriate, recommend discontinuing filter prior to discharge. Please re-evaluate the patient for filter discontinuation when they are seen in follow up, and refer patient to Interventional Radiology for removal. Thom Hall, MD [facility] Radiology Electronically Signed   By: Thom Hall M.D.   On: 06/07/2024 07:44   CT Angio Chest Pulmonary Embolism (PE) W or WO Contrast Result Date: 06/06/2024 CLINICAL DATA:  Acute pulmonary embolism, unspecified pulmonary embolism type, unspecified whether acute cor pulmonale present. Pulmonary embolism seen on CTA from 06/05/2024. EXAM: CT ANGIOGRAPHY CHEST WITH CONTRAST TECHNIQUE: Multidetector CT imaging of the chest was performed using the standard protocol during bolus administration of intravenous contrast. Multiplanar CT image reconstructions and MIPs were obtained to evaluate the vascular anatomy. RADIATION DOSE REDUCTION: This exam was performed according to the departmental dose-optimization program which includes automated exposure control, adjustment of the mA and/or kV according to patient size and/or use of iterative reconstruction technique. CONTRAST:  75mL OMNIPAQUE  IOHEXOL  350 MG/ML SOLN COMPARISON:  CTA abdomen pelvis 06/05/2024 FINDINGS: Cardiovascular: Positive for bilateral pulmonary emboli. Moderate  clot burden with predominantly nonocclusive clot bilaterally. There is a small amount of clot in the distal right pulmonary artery extending into the truncus anterior and right interlobar artery. Clot extending into right segmental pulmonary arteries. Clot in the left lobar and segmental pulmonary arteries. No evidence for right heart strain. Normal caliber of the thoracic aorta. Arch great vessels are patent. Heart size is normal. No significant pericardial effusion. Embolization coils involving the gastroduodenal artery. Mediastinum/Nodes: Again noted is a soft tissue mass in  the right anterior cardiophrenic fat measuring up to 3.4 cm. No significant mediastinal or hilar lymph node enlargement. No axillary lymph enlargement. Lungs/Pleura: Trachea and mainstem bronchi are patent. No pleural effusions. Small parenchymal densities or airspace densities in the medial right upper lobe on image 51, sequence 7 are new since 2023. Findings could be associated with infarct versus small area of airspace disease. 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 These findings are poorly characterized on this chest CT. Right adrenal nodule. Musculoskeletal: No acute bone abnormality. Review of the MIP images confirms the above findings. IMPRESSION: 1. Positive for bilateral pulmonary emboli with moderate clot burden. Pulmonary emboli are predominantly nonocclusive and findings could be subacute. No evidence for right heart strain. 2. Parenchymal densities in the medial right lung could represent a small infarct versus small area of infection/inflammation. 3. Neoplastic disease in the upper abdomen. These results were called by telephone at the time of interpretation on 06/06/2024 at 2:04 pm to provider Dr. Hughes, Who verbally acknowledged these results. Electronically Signed   By: Juliene Balder M.D.   On: 06/06/2024 14:09   CT Angio Abd/Pel w/ and/or w/o Result Date: 06/06/2024 EXAM: CTA ABDOMEN AND PELVIS WITHOUT AND WITH CONTRAST 06/05/2024 02:49:17 PM TECHNIQUE: CTA images of the abdomen and pelvis without and with intravenous contrast. 125 mL (iopamidol  (ISOVUE -370) 76 % injection 125 mL IOPAMIDOL  (ISOVUE -370) INJECTION 76%) was administered. Three-dimensional MIP/volume rendered formations were performed. Automated exposure control, iterative reconstruction, and/or weight based adjustment of the mA/kV was utilized to reduce the radiation dose to as low as reasonably achievable. COMPARISON: Stable compared with the previous exam where  applicable. New from prior exam where applicable. CLINICAL HISTORY: FINDINGS: VASCULATURE: AORTA: Aortic atherosclerotic calcification. No acute finding. No abdominal aortic aneurysm. No dissection. CELIAC TRUNK: No acute finding. No occlusion or significant stenosis. SUPERIOR MESENTERIC ARTERY: No acute finding. No occlusion or significant stenosis. RENAL ARTERIES: No acute finding. No occlusion or significant stenosis. ILIAC ARTERIES: No acute finding. No occlusion or significant stenosis. LIVER: Bilobar liver metastases. Index lesion within segment 7 measures 2.9 x 2.4 cm, axial image 24/7. Index lesion within segment 4a measures 8.7 x 6.6 cm, image 16/7. The index lesion within segment 2 measures 5.4 x 4.2 cm, axial image 16/7. GALLBLADDER AND BILE DUCTS: Circumferential gallbladder wall thickening of the gallbladder measures 4 mm. No biliary ductal dilatation. SPLEEN: The spleen is within normal limits in size and appearance. PANCREAS: The pancreas is normal in size and contour without focal lesion or ductal dilatation. ADRENAL GLANDS: There is a right adrenal nodule measuring 2.1 x 1.8 cm and 9 Hounsfield units, which is stable compared with the previous exam. KIDNEYS, URETERS AND BLADDER: Persistent stone within the urinary bladder near the expected location of the left UVJ measuring 7 mm in length, image 75/7. There is no hydronephrosis identified. No stones in the kidneys or ureters. No perinephric or periureteral stranding. GI AND BOWEL: There is a  large mass within the upper abdomen which has increased in size from the previous exam corresponding to known duodenal malignancy. This measures 10.7 x 8.9 x 7.3 cm. On the previous exam this measured 7.2 x 7.5 x 6.9 cm. Mass effect on the patent extrahepatic portal vein is again noted. On today's exam the mass extends up and abuts the surface of the gallbladder with loss of a fat plane between these two structures. The mass also extends up to the serosal  surface of the transverse colon with loss of a fat plane between these two structures as well. There is no pathologic dilatation of the bowel loops to indicate a mechanical obstruction. There is a moderate to large stool burden identified within the colon. No signs of active disease. No signs of intraluminal contrast extravasation to suggest active GI bleeding. REPRODUCTIVE: The uterus appears surgically absent. PERITONEUM AND RETRPERITONEUM: Signs of peritoneal metastases. Index a lesion within the right lower quadrant measures 3.1 cm (image 57/7). New from prior exam. Along the right parapelvic sidewall, there is a lesion measuring 1.2 cm, image 64/7. Midline periumbilical hernia contains fat only. No ascites or free air. LUNG BASE: Filling defect within a segmental branch of the right lower lobe pulmonary artery, compatible with incidental pulmonary embolus, axial image 2/5. LYMPH NODES: Abdominal adenopathy is identified. The index left retroperitoneal node measures 1.1 cm, image 39/7. On the previous exam, this measured 7 mm. BONES AND SOFT TISSUES: Postoperative changes noted within the lumbar spine. No acute or suspicious osseous findings. No acute soft tissue abnormality. IMPRESSION: 1. No evidence of active gastrointestinal bleeding on this CTA abdomen and pelvis. 2. Incidental pulmonary embolism identified within a segmental branch of the right lower lobe pulmonary artery. 3. 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 4. Bilobar hepatic metastatic disease.  New from prior. 5. Progressive abdominal nodal metastatic disease. 6. Peritoneal metastatic disease, new from prior. Electronically signed by: Waddell Calk MD 06/06/2024 07:36 AM EST RP Workstation: [id number]   DG Chest Portable 1 View Result Date: 06/04/2024 EXAM: 1 VIEW(S) XRAY OF THE CHEST 06/04/2024 04:19:00 PM  COMPARISON: 05/09/2024 CLINICAL HISTORY: Fever. FINDINGS: LINES, TUBES AND DEVICES: Right chest power injectable port in place with tip at the lower SVC. LUNGS AND PLEURA: Panlike density favoring atelectasis along the left hemidiaphragm. No pleural effusion. No pneumothorax. HEART AND MEDIASTINUM: No acute abnormality of the cardiac and mediastinal silhouettes. BONES AND SOFT TISSUES: No acute osseous abnormality. JOINTS: Mild degenerative glenohumeral arthropathy bilaterally. IMPRESSION: 1. Mild left basilar atelectasis along the left hemidiaphragm. 2. Right chest power-injectable port with tip in the lower SVC. 3. Mild bilateral glenohumeral degenerative arthropathy. Electronically signed by: Ryan Salvage MD 06/04/2024 04:52 PM EST RP Workstation: [id number]   IR Radiologist Eval & Mgmt Result Date: 05/27/2024 EXAM: NEW PATIENT OFFICE VISIT CHIEF COMPLAINT: See below HISTORY OF PRESENT ILLNESS: See below REVIEW OF SYSTEMS: See below PHYSICAL EXAMINATION: See below ASSESSMENT AND PLAN: Please refer to completed note in the electronic medical record on [facility] [employer] Thom Hall, MD [facility] Inter[facility] Radiology [facility] Radiology Electronically Signed   By: Thom Hall M.D.   On: 05/27/2024 09:02    Assessment and plan-   # Metastatic ovarian cancer with duodenal metastasis Prognosis is poor. Patient declines further chemotherapy treatment and is interested in continuing supportive care at cancer center.    # Acute blood  loss anemia.  She understands that she may develop massive bleeding in the near future which may not be salvageable by blood transfusion. Hb is trending down. Recommend her to receive 1 unit of PRBC tomorrow before discharge.    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  Thank you for allowing me to participate in the care of this patient.   Zelphia Cap, MD, PhD Hematology Oncology 06/13/2024      [1]   Allergies Allergen Reactions   Cymbalta [Duloxetine Hcl] Shortness Of Breath, Swelling and Other (See Comments)    Tongue and leg swelling   Cyclobenzaprine Hcl Other (See Comments)    Immobility   Celexa  [Citalopram Hydrobromide] Swelling and Other (See Comments)    Tongue swelling   Fluticasone  Furoate-Vilanterol Swelling and Other (See Comments)    BREO ELLIPTA - Tongue swelling, but breathing not affected   Gabapentin Other (See Comments)    Feels awful when taking, drowsy    Lyrica  Cr [Pregabalin  Er] Swelling    Numbness   Opana  [Oxymorphone Hcl] Itching and Other (See Comments)    Loss of hair, also   Tramadol Other (See Comments)    Dizziness   [2]  Current Facility-Administered Medications:    acetaminophen  (TYLENOL ) tablet 650 mg, 650 mg, Oral, Q6H PRN **OR** acetaminophen  (TYLENOL ) suppository 650 mg, 650 mg, Rectal, Q6H PRN, Jens Durand, MD   albuterol  (PROVENTIL ) (2.5 MG/3ML) 0.083% nebulizer solution 2.5 mg, 2.5 mg, Nebulization, Q6H PRN, Jens Durand, MD   Chlorhexidine  Gluconate Cloth 2 % PADS 6 each, 6 each, Topical, Daily, Arlon Carliss ORN, DO, 6 each at 06/13/24 1057   dexamethasone  (DECADRON ) tablet 4 mg, 4 mg, Oral, Daily, Ayiku, Bernard, MD, 4 mg at 06/13/24 0957   feeding supplement (ENSURE PLUS HIGH PROTEIN) liquid 237 mL, 237 mL, Oral, BID BM, Calkins, Derek W, DO, 237 mL at 06/13/24 1539   HYDROmorphone  (DILAUDID ) injection 1 mg, 1 mg, Intravenous, Q3H PRN, Ayiku, Bernard, MD, 1 mg at 06/13/24 1431   hydroxychloroquine  (PLAQUENIL ) tablet 200 mg, 200 mg, Oral, BID, Ayiku, Bernard, MD, 200 mg at 06/13/24 0957   LORazepam  (ATIVAN ) tablet 0.5 mg, 0.5 mg, Oral, TID PRN, Jens Durand, MD, 0.5 mg at 06/13/24 1134   mirtazapine  (REMERON ) tablet 7.5 mg, 7.5 mg, Oral, QHS, Jens Durand, MD, 7.5 mg at 06/12/24 2212   naloxone  (NARCAN ) nasal spray 4 mg/0.1 mL, 1 spray, Nasal, PRN, Jens Durand, MD   oxyCODONE  (Oxy IR/ROXICODONE ) immediate release tablet 20  mg, 20 mg, Oral, 5 X Daily PRN, Ayiku, Bernard, MD, 20 mg at 06/13/24 1538   oxyCODONE  (OXYCONTIN ) 12 hr tablet 15 mg, 15 mg, Oral, Q12H, Ayiku, Bernard, MD, 15 mg at 06/13/24 0957   pantoprazole  (PROTONIX ) EC tablet 40 mg, 40 mg, Oral, Daily, Ayiku, Bernard, MD, 40 mg at 06/13/24 0957   polyethylene glycol (MIRALAX  / GLYCOLAX ) packet 17 g, 17 g, Oral, Daily PRN, Jens Durand, MD   prochlorperazine  (COMPAZINE ) tablet 10 mg, 10 mg, Oral, Q6H PRN, Jens Durand, MD   sodium chloride  flush (NS) 0.9 % injection 10-40 mL, 10-40 mL, Intracatheter, Q12H, Calkins, Derek W, DO, 10 mL at 06/13/24 0957   sodium chloride  flush (NS) 0.9 % injection 10-40 mL, 10-40 mL, Intracatheter, PRN, Arlon Carliss ORN, DO   [START ON 06/15/2024] Vitamin D  (Ergocalciferol ) (DRISDOL ) 1.25 MG (50000 UNIT) capsule 50,000 Units, 50,000 Units, Oral, ELINORE Austin Jens Durand, MD  "

## 2024-06-17 ENCOUNTER — Inpatient Hospital Stay

## 2024-06-18 ENCOUNTER — Inpatient Hospital Stay

## 2024-06-18 ENCOUNTER — Inpatient Hospital Stay: Admitting: Oncology

## 2024-06-19 ENCOUNTER — Inpatient Hospital Stay: Admitting: Oncology

## 2024-06-19 ENCOUNTER — Inpatient Hospital Stay
